# Patient Record
Sex: Female | Born: 1945 | Race: White | Hispanic: No | Smoking: Never smoker
Health system: Southern US, Community
[De-identification: ages and names within clinical notes are randomized; demographics above are authoritative.]

## PROBLEM LIST (undated history)

## (undated) DIAGNOSIS — I499 Cardiac arrhythmia, unspecified: Secondary | ICD-10-CM

## (undated) DIAGNOSIS — I10 Essential (primary) hypertension: Secondary | ICD-10-CM

## (undated) DIAGNOSIS — I350 Nonrheumatic aortic (valve) stenosis: Secondary | ICD-10-CM

## (undated) DIAGNOSIS — G2581 Restless legs syndrome: Secondary | ICD-10-CM

## (undated) DIAGNOSIS — M549 Dorsalgia, unspecified: Secondary | ICD-10-CM

## (undated) DIAGNOSIS — Z950 Presence of cardiac pacemaker: Secondary | ICD-10-CM

## (undated) DIAGNOSIS — N189 Chronic kidney disease, unspecified: Secondary | ICD-10-CM

## (undated) DIAGNOSIS — K219 Gastro-esophageal reflux disease without esophagitis: Secondary | ICD-10-CM

## (undated) DIAGNOSIS — R5383 Other fatigue: Secondary | ICD-10-CM

## (undated) DIAGNOSIS — Z87442 Personal history of urinary calculi: Secondary | ICD-10-CM

## (undated) DIAGNOSIS — D649 Anemia, unspecified: Secondary | ICD-10-CM

## (undated) DIAGNOSIS — J45909 Unspecified asthma, uncomplicated: Secondary | ICD-10-CM

## (undated) DIAGNOSIS — F329 Major depressive disorder, single episode, unspecified: Secondary | ICD-10-CM

## (undated) DIAGNOSIS — I779 Disorder of arteries and arterioles, unspecified: Secondary | ICD-10-CM

## (undated) DIAGNOSIS — E119 Type 2 diabetes mellitus without complications: Secondary | ICD-10-CM

## (undated) DIAGNOSIS — I4819 Other persistent atrial fibrillation: Secondary | ICD-10-CM

## (undated) DIAGNOSIS — E785 Hyperlipidemia, unspecified: Secondary | ICD-10-CM

## (undated) DIAGNOSIS — E669 Obesity, unspecified: Secondary | ICD-10-CM

## (undated) DIAGNOSIS — E559 Vitamin D deficiency, unspecified: Secondary | ICD-10-CM

## (undated) DIAGNOSIS — F32A Depression, unspecified: Secondary | ICD-10-CM

## (undated) DIAGNOSIS — R011 Cardiac murmur, unspecified: Secondary | ICD-10-CM

## (undated) DIAGNOSIS — M069 Rheumatoid arthritis, unspecified: Secondary | ICD-10-CM

## (undated) DIAGNOSIS — I2510 Atherosclerotic heart disease of native coronary artery without angina pectoris: Secondary | ICD-10-CM

## (undated) DIAGNOSIS — M2550 Pain in unspecified joint: Secondary | ICD-10-CM

## (undated) DIAGNOSIS — I509 Heart failure, unspecified: Secondary | ICD-10-CM

## (undated) DIAGNOSIS — Z8679 Personal history of other diseases of the circulatory system: Secondary | ICD-10-CM

## (undated) DIAGNOSIS — M1990 Unspecified osteoarthritis, unspecified site: Secondary | ICD-10-CM

## (undated) DIAGNOSIS — I517 Cardiomegaly: Secondary | ICD-10-CM

## (undated) DIAGNOSIS — R0602 Shortness of breath: Secondary | ICD-10-CM

## (undated) HISTORY — DX: Anemia, unspecified: D64.9

## (undated) HISTORY — DX: Vitamin D deficiency, unspecified: E55.9

## (undated) HISTORY — DX: Unspecified asthma, uncomplicated: J45.909

## (undated) HISTORY — DX: Cardiomegaly: I51.7

## (undated) HISTORY — DX: Type 2 diabetes mellitus without complications: E11.9

## (undated) HISTORY — DX: Essential (primary) hypertension: I10

## (undated) HISTORY — DX: Pain in unspecified joint: M25.50

## (undated) HISTORY — PX: BUNIONECTOMY: SHX129

## (undated) HISTORY — DX: Other fatigue: R53.83

## (undated) HISTORY — PX: WRIST SURGERY: SHX841

## (undated) HISTORY — PX: MOUTH SURGERY: SHX715

## (undated) HISTORY — DX: Shortness of breath: R06.02

## (undated) HISTORY — DX: Dorsalgia, unspecified: M54.9

## (undated) HISTORY — DX: Rheumatoid arthritis, unspecified: M06.9

## (undated) HISTORY — PX: TONSILLECTOMY: SUR1361

## (undated) HISTORY — PX: TUBAL LIGATION: SHX77

---
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
## 1999-02-25 ENCOUNTER — Other Ambulatory Visit: Admission: RE | Admit: 1999-02-25 | Discharge: 1999-02-25 | Payer: Self-pay | Admitting: *Deleted

## 1999-03-17 ENCOUNTER — Encounter: Admission: RE | Admit: 1999-03-17 | Discharge: 1999-03-17 | Payer: Self-pay | Admitting: *Deleted

## 1999-03-17 ENCOUNTER — Encounter: Payer: Self-pay | Admitting: *Deleted

## 2000-09-14 ENCOUNTER — Encounter (INDEPENDENT_AMBULATORY_CARE_PROVIDER_SITE_OTHER): Payer: Self-pay | Admitting: Specialist

## 2000-09-14 ENCOUNTER — Ambulatory Visit (HOSPITAL_COMMUNITY): Admission: RE | Admit: 2000-09-14 | Discharge: 2000-09-14 | Payer: Self-pay | Admitting: Gastroenterology

## 2000-09-21 ENCOUNTER — Encounter: Payer: Self-pay | Admitting: Internal Medicine

## 2000-09-21 ENCOUNTER — Encounter: Admission: RE | Admit: 2000-09-21 | Discharge: 2000-09-21 | Payer: Self-pay | Admitting: Internal Medicine

## 2001-09-24 ENCOUNTER — Encounter: Admission: RE | Admit: 2001-09-24 | Discharge: 2001-09-24 | Payer: Self-pay | Admitting: Internal Medicine

## 2001-09-24 ENCOUNTER — Encounter: Payer: Self-pay | Admitting: Internal Medicine

## 2002-08-29 ENCOUNTER — Other Ambulatory Visit: Admission: RE | Admit: 2002-08-29 | Discharge: 2002-08-29 | Payer: Self-pay | Admitting: Internal Medicine

## 2003-09-09 ENCOUNTER — Encounter: Admission: RE | Admit: 2003-09-09 | Discharge: 2003-09-09 | Payer: Self-pay | Admitting: Internal Medicine

## 2004-09-03 ENCOUNTER — Other Ambulatory Visit: Admission: RE | Admit: 2004-09-03 | Discharge: 2004-09-03 | Payer: Self-pay | Admitting: Internal Medicine

## 2004-09-10 ENCOUNTER — Encounter: Admission: RE | Admit: 2004-09-10 | Discharge: 2004-09-10 | Payer: Self-pay | Admitting: Internal Medicine

## 2004-09-23 ENCOUNTER — Encounter: Admission: RE | Admit: 2004-09-23 | Discharge: 2004-09-23 | Payer: Self-pay | Admitting: Internal Medicine

## 2005-09-03 ENCOUNTER — Emergency Department (HOSPITAL_COMMUNITY): Admission: EM | Admit: 2005-09-03 | Discharge: 2005-09-03 | Payer: Self-pay | Admitting: Emergency Medicine

## 2005-09-07 ENCOUNTER — Other Ambulatory Visit: Admission: RE | Admit: 2005-09-07 | Discharge: 2005-09-07 | Payer: Self-pay | Admitting: Internal Medicine

## 2005-09-23 ENCOUNTER — Encounter: Admission: RE | Admit: 2005-09-23 | Discharge: 2005-09-23 | Payer: Self-pay | Admitting: Orthopedic Surgery

## 2005-11-02 ENCOUNTER — Emergency Department (HOSPITAL_COMMUNITY): Admission: EM | Admit: 2005-11-02 | Discharge: 2005-11-02 | Payer: Self-pay | Admitting: Emergency Medicine

## 2006-03-16 ENCOUNTER — Encounter: Admission: RE | Admit: 2006-03-16 | Discharge: 2006-03-16 | Payer: Self-pay | Admitting: Internal Medicine

## 2007-12-12 ENCOUNTER — Other Ambulatory Visit: Admission: RE | Admit: 2007-12-12 | Discharge: 2007-12-12 | Payer: Self-pay | Admitting: Internal Medicine

## 2007-12-14 ENCOUNTER — Encounter: Admission: RE | Admit: 2007-12-14 | Discharge: 2007-12-14 | Payer: Self-pay | Admitting: Internal Medicine

## 2009-04-03 ENCOUNTER — Encounter: Admission: RE | Admit: 2009-04-03 | Discharge: 2009-04-03 | Payer: Self-pay | Admitting: Specialist

## 2009-05-01 ENCOUNTER — Emergency Department (HOSPITAL_COMMUNITY): Admission: EM | Admit: 2009-05-01 | Discharge: 2009-05-01 | Payer: Self-pay | Admitting: Emergency Medicine

## 2009-05-21 ENCOUNTER — Encounter: Admission: RE | Admit: 2009-05-21 | Discharge: 2009-05-21 | Payer: Self-pay | Admitting: Urology

## 2010-02-21 ENCOUNTER — Encounter: Payer: Self-pay | Admitting: Internal Medicine

## 2010-02-21 ENCOUNTER — Encounter: Payer: Self-pay | Admitting: Urology

## 2010-06-18 NOTE — Procedures (Signed)
[facility]  Patient:    Briana Foster, Briana Foster                        MRN: [id number] Proc. Date: 09/14/00 Attending:  Verlin Grills, M.D. CC:         Georgann Housekeeper, M.D., [facility]   Procedure Report  PROCEDURE:  Colonoscopy and rectal polyp biopsy.  REFERRING PHYSICIAN:  Georgann Housekeeper, M.D.  PROCEDURE INDICATION:  [name] (date of birth 01-06-2046) is a 65 year old female, who is referred for surveillance colonoscopy and polypectomy to prevent colon cancer.  ENDOSCOPIST:  Verlin Grills, M.D.  PREMEDICATION:  Demerol 50 mg, Versed 10 mg.  ENDOSCOPE:  Olympus pediatric colonoscope.  DESCRIPTION OF PROCEDURE:  After obtaining informed consent, [name] was placed in the left lateral decubitus position.  I administered intravenous Demerol and intravenous Versed to achieve conscious sedation for the procedure.  The patients blood pressure, oxygen saturations, and cardiac rhythm were monitored throughout the procedure and documented in the medical record.  Anal inspection was normal.  Digital rectal exam was normal.  The Olympus pediatric video colonoscope was introduced into the rectum and easily advanced to the cecum.  Colonic preparation for the exam today was excellent.  RECTUM:  From the mid rectum, a 0.5 mm whitish polyp was removed with the cold biopsy forceps and submitted to pathology for interpretation.  I doubt this is a neoplastic polyps.  SIGMOID COLON AND DESCENDING COLON:  Normal.  SPLENIC FLEXURE:  Normal.  TRANSVERSE COLON:  Normal.  HEPATIC FLEXURE:  Normal.  ASCENDING COLON:  Normal.  CECUM AND ILEOCECAL VALVE:  Normal.  ASSESSMENT:  From the mid rectum, a 0.5 mm whitish rectal polyp was removed with the cold biopsy forceps and submitted for pathological interpretation. Otherwise, normal proctocolonoscopy to the cecum. DD:  09/14/00 TD:  09/14/00 Job: [id number]

## 2011-11-16 ENCOUNTER — Other Ambulatory Visit: Payer: Self-pay | Admitting: Orthopedic Surgery

## 2011-11-24 ENCOUNTER — Encounter (HOSPITAL_COMMUNITY)
Admission: RE | Admit: 2011-11-24 | Discharge: 2011-11-24 | Disposition: A | Discharge status: Home / Self Care | Payer: 59 | Source: Ambulatory Visit | Attending: Orthopedic Surgery | Admitting: Orthopedic Surgery

## 2011-11-24 ENCOUNTER — Encounter (HOSPITAL_COMMUNITY): Payer: Self-pay

## 2011-11-24 ENCOUNTER — Encounter (HOSPITAL_COMMUNITY): Payer: Self-pay | Admitting: Pharmacy Technician

## 2011-11-24 HISTORY — DX: Depression, unspecified: F32.A

## 2011-11-24 HISTORY — DX: Restless legs syndrome: G25.81

## 2011-11-24 HISTORY — DX: Hyperlipidemia, unspecified: E78.5

## 2011-11-24 HISTORY — DX: Gastro-esophageal reflux disease without esophagitis: K21.9

## 2011-11-24 HISTORY — DX: Personal history of other diseases of the circulatory system: Z86.79

## 2011-11-24 HISTORY — DX: Major depressive disorder, single episode, unspecified: F32.9

## 2011-11-24 HISTORY — DX: Unspecified osteoarthritis, unspecified site: M19.90

## 2011-11-24 LAB — CBC
HCT: 39.5 % (ref 36.0–46.0)
MCHC: 33.9 g/dL (ref 30.0–36.0)
RBC: 4.31 MIL/uL (ref 3.87–5.11)
WBC: 7.1 10*3/uL (ref 4.0–10.5)

## 2011-11-24 LAB — SURGICAL PCR SCREEN: MRSA, PCR: NEGATIVE

## 2011-11-24 NOTE — Progress Notes (Signed)
Phone order taken for Ancef 2 Gm IV pre op from [facility] / Mariea Clonts RN --order put into [facility]

## 2011-11-24 NOTE — Patient Instructions (Addendum)
Claudene B Frieden  11/24/2011                           YOUR PROCEDURE IS SCHEDULED ON:  11/30/11 AT 12:30 PM               PLEASE REPORT TO SHORT STAY CENTER AT :  10:00 AM               CALL THIS NUMBER IF ANY PROBLEMS THE DAY OF SURGERY :               [phone]                      REMEMBER:   Do not eat food or drink liquids AFTER MIDNIGHT  May have clear liquids UNTIL 6 HOURS BEFORE SURGERY 6:30 AM)  Clear liquids include soda, tea, black coffee, apple or grape juice, broth.  Take these medicines the morning of surgery with A SIP OF WATER:  CYMBALTA   Do not wear jewelry, make-up   Do not wear lotions, powders, or perfumes.   Do not shave legs or underarms 12 hrs. before surgery (men may shave face)  Do not bring valuables to the hospital.  Contacts, dentures or bridgework may not be worn into surgery.  Leave suitcase in the car. After surgery it may be brought to your room.  For patients admitted to the hospital more than one night, checkout time is 11:00                          The day of discharge.   Patients discharged the day of surgery will not be allowed to drive home                             If going home same day of surgery, must have someone stay with you first                           24 hrs at home and arrange for some one to drive you home from hospital.    Special Instructions:   Please read over the following fact sheets that you were given:               1. MRSA  INFORMATION                      2.  PREPARING FOR SURGERY SHEET                                                X_____________________________________________________________________

## 2011-11-30 ENCOUNTER — Encounter (HOSPITAL_COMMUNITY): Payer: Self-pay | Admitting: Anesthesiology

## 2011-11-30 ENCOUNTER — Observation Stay (HOSPITAL_COMMUNITY)
Admission: RE | Admit: 2011-11-30 | Discharge: 2011-12-01 | Disposition: A | Discharge status: Home / Self Care | Payer: 59 | Source: Ambulatory Visit | Attending: Orthopedic Surgery | Admitting: Orthopedic Surgery

## 2011-11-30 ENCOUNTER — Encounter (HOSPITAL_COMMUNITY)
Admission: RE | Disposition: A | Discharge status: Home / Self Care | Payer: Self-pay | Source: Ambulatory Visit | Attending: Orthopedic Surgery

## 2011-11-30 ENCOUNTER — Ambulatory Visit (HOSPITAL_COMMUNITY): Payer: 59 | Admitting: Anesthesiology

## 2011-11-30 ENCOUNTER — Encounter (HOSPITAL_COMMUNITY): Payer: Self-pay | Admitting: *Deleted

## 2011-11-30 DIAGNOSIS — I10 Essential (primary) hypertension: Secondary | ICD-10-CM | POA: Insufficient documentation

## 2011-11-30 DIAGNOSIS — L84 Corns and callosities: Secondary | ICD-10-CM | POA: Insufficient documentation

## 2011-11-30 DIAGNOSIS — M2010 Hallux valgus (acquired), unspecified foot: Principal | ICD-10-CM | POA: Insufficient documentation

## 2011-11-30 DIAGNOSIS — Z01810 Encounter for preprocedural cardiovascular examination: Secondary | ICD-10-CM | POA: Insufficient documentation

## 2011-11-30 DIAGNOSIS — E785 Hyperlipidemia, unspecified: Secondary | ICD-10-CM | POA: Insufficient documentation

## 2011-11-30 DIAGNOSIS — Z01812 Encounter for preprocedural laboratory examination: Secondary | ICD-10-CM | POA: Insufficient documentation

## 2011-11-30 DIAGNOSIS — K219 Gastro-esophageal reflux disease without esophagitis: Secondary | ICD-10-CM | POA: Insufficient documentation

## 2011-11-30 DIAGNOSIS — M21619 Bunion of unspecified foot: Secondary | ICD-10-CM | POA: Insufficient documentation

## 2011-11-30 HISTORY — PX: BUNIONECTOMY: SHX129

## 2011-11-30 SURGERY — BUNIONECTOMY
Anesthesia: General | Site: Foot | Laterality: Bilateral | Wound class: Clean

## 2011-11-30 MED ORDER — MORPHINE SULFATE (PF) 1 MG/ML IV SOLN
INTRAVENOUS | Status: AC
  Filled 2011-11-30: qty 25

## 2011-11-30 MED ORDER — HYDROMORPHONE HCL PF 1 MG/ML IJ SOLN
0.2500 mg | INTRAMUSCULAR | Status: DC | PRN
  Administered 2011-11-30 (×2): 0.5 mg via INTRAVENOUS

## 2011-11-30 MED ORDER — LABETALOL HCL 5 MG/ML IV SOLN
INTRAVENOUS | Status: DC | PRN
  Administered 2011-11-30: 5 mg via INTRAVENOUS

## 2011-11-30 MED ORDER — KETOROLAC TROMETHAMINE 30 MG/ML IJ SOLN
INTRAMUSCULAR | Status: DC | PRN
  Administered 2011-11-30: 30 mg via INTRAVENOUS

## 2011-11-30 MED ORDER — CEFAZOLIN SODIUM 1-5 GM-% IV SOLN
1.0000 g | Freq: Four times a day (QID) | INTRAVENOUS | Status: AC
  Administered 2011-11-30 – 2011-12-01 (×3): 1 g via INTRAVENOUS
  Filled 2011-11-30 (×4): qty 50

## 2011-11-30 MED ORDER — LACTATED RINGERS IV SOLN: INTRAVENOUS | Status: DC

## 2011-11-30 MED ORDER — DEXTROSE-NACL 5-0.45 % IV SOLN
INTRAVENOUS | Status: DC
  Administered 2011-11-30: 100 mL/h via INTRAVENOUS
  Administered 2011-12-01: 04:00:00 via INTRAVENOUS

## 2011-11-30 MED ORDER — BUPIVACAINE HCL (PF) 0.5 % IJ SOLN
INTRAMUSCULAR | Status: AC
  Filled 2011-11-30: qty 30

## 2011-11-30 MED ORDER — MORPHINE SULFATE (PF) 1 MG/ML IV SOLN
INTRAVENOUS | Status: DC
  Administered 2011-11-30: 15:00:00 via INTRAVENOUS

## 2011-11-30 MED ORDER — LACTATED RINGERS IV SOLN
INTRAVENOUS | Status: DC
  Administered 2011-11-30: 13:00:00 via INTRAVENOUS
  Administered 2011-11-30: 1000 mL via INTRAVENOUS

## 2011-11-30 MED ORDER — ONDANSETRON HCL 4 MG PO TABS: 4.0000 mg | ORAL_TABLET | Freq: Four times a day (QID) | ORAL | Status: DC | PRN

## 2011-11-30 MED ORDER — MEPERIDINE HCL 50 MG/ML IJ SOLN: 6.2500 mg | INTRAMUSCULAR | Status: DC | PRN

## 2011-11-30 MED ORDER — EPHEDRINE SULFATE 50 MG/ML IJ SOLN
INTRAMUSCULAR | Status: DC | PRN
  Administered 2011-11-30 (×2): 5 mg via INTRAVENOUS

## 2011-11-30 MED ORDER — ONDANSETRON HCL 4 MG/2ML IJ SOLN: 4.0000 mg | Freq: Four times a day (QID) | INTRAMUSCULAR | Status: DC | PRN

## 2011-11-30 MED ORDER — CEFAZOLIN SODIUM-DEXTROSE 2-3 GM-% IV SOLR
2.0000 g | INTRAVENOUS | Status: AC
  Administered 2011-11-30: 2 g via INTRAVENOUS

## 2011-11-30 MED ORDER — ACETAMINOPHEN 10 MG/ML IV SOLN
INTRAVENOUS | Status: AC
  Filled 2011-11-30: qty 100

## 2011-11-30 MED ORDER — DEXAMETHASONE SODIUM PHOSPHATE 4 MG/ML IJ SOLN
INTRAMUSCULAR | Status: DC | PRN
  Administered 2011-11-30: 10 mg via INTRAVENOUS

## 2011-11-30 MED ORDER — HYDROMORPHONE HCL PF 1 MG/ML IJ SOLN
INTRAMUSCULAR | Status: AC
  Filled 2011-11-30: qty 1

## 2011-11-30 MED ORDER — ONDANSETRON HCL 4 MG/2ML IJ SOLN
4.0000 mg | Freq: Four times a day (QID) | INTRAMUSCULAR | Status: DC | PRN
  Administered 2011-12-01: 4 mg via INTRAVENOUS
  Filled 2011-11-30: qty 2

## 2011-11-30 MED ORDER — POVIDONE-IODINE 7.5 % EX SOLN: Freq: Once | CUTANEOUS | Status: DC

## 2011-11-30 MED ORDER — BUPIVACAINE HCL (PF) 0.5 % IJ SOLN
INTRAMUSCULAR | Status: DC | PRN
  Administered 2011-11-30: 1 mL

## 2011-11-30 MED ORDER — ONDANSETRON HCL 4 MG/2ML IJ SOLN
INTRAMUSCULAR | Status: DC | PRN
  Administered 2011-11-30: 4 mg via INTRAVENOUS

## 2011-11-30 MED ORDER — DIPHENHYDRAMINE HCL 12.5 MG/5ML PO ELIX: 12.5000 mg | ORAL_SOLUTION | Freq: Four times a day (QID) | ORAL | Status: DC | PRN

## 2011-11-30 MED ORDER — SODIUM CHLORIDE 0.9 % IJ SOLN: 9.0000 mL | INTRAMUSCULAR | Status: DC | PRN

## 2011-11-30 MED ORDER — MIDAZOLAM HCL 5 MG/5ML IJ SOLN
INTRAMUSCULAR | Status: DC | PRN
  Administered 2011-11-30: 2 mg via INTRAVENOUS

## 2011-11-30 MED ORDER — ACETAMINOPHEN 10 MG/ML IV SOLN
INTRAVENOUS | Status: DC | PRN
  Administered 2011-11-30: 1000 mg via INTRAVENOUS

## 2011-11-30 MED ORDER — GABAPENTIN 300 MG PO CAPS
300.0000 mg | ORAL_CAPSULE | Freq: Every evening | ORAL | Status: DC
  Administered 2011-11-30: 300 mg via ORAL
  Filled 2011-11-30 (×2): qty 1

## 2011-11-30 MED ORDER — FENTANYL CITRATE 0.05 MG/ML IJ SOLN: 25.0000 ug | INTRAMUSCULAR | Status: DC | PRN

## 2011-11-30 MED ORDER — LIDOCAINE HCL (CARDIAC) 20 MG/ML IV SOLN
INTRAVENOUS | Status: DC | PRN
  Administered 2011-11-30: 100 mg via INTRAVENOUS

## 2011-11-30 MED ORDER — CEFAZOLIN SODIUM-DEXTROSE 2-3 GM-% IV SOLR
INTRAVENOUS | Status: AC
  Filled 2011-11-30: qty 50

## 2011-11-30 MED ORDER — MORPHINE SULFATE 2 MG/ML IJ SOLN: 1.0000 mg | INTRAMUSCULAR | Status: DC | PRN

## 2011-11-30 MED ORDER — PROMETHAZINE HCL 25 MG/ML IJ SOLN: 6.2500 mg | INTRAMUSCULAR | Status: DC | PRN

## 2011-11-30 MED ORDER — ROPINIROLE HCL 1 MG PO TABS
2.0000 mg | ORAL_TABLET | Freq: Every day | ORAL | Status: DC
Start: 2011-11-30 — End: 2011-12-01
  Administered 2011-11-30: 2 mg via ORAL
  Filled 2011-11-30 (×2): qty 2

## 2011-11-30 MED ORDER — 0.9 % SODIUM CHLORIDE (POUR BTL) OPTIME
TOPICAL | Status: DC | PRN
  Administered 2011-11-30: 1000 mL

## 2011-11-30 MED ORDER — BUPIVACAINE HCL 0.5 % IJ SOLN
INTRAMUSCULAR | Status: DC | PRN
  Administered 2011-11-30: 1 mL

## 2011-11-30 MED ORDER — FENTANYL CITRATE 0.05 MG/ML IJ SOLN
INTRAMUSCULAR | Status: DC | PRN
  Administered 2011-11-30 (×3): 25 ug via INTRAVENOUS
  Administered 2011-11-30: 50 ug via INTRAVENOUS
  Administered 2011-11-30: 25 ug via INTRAVENOUS

## 2011-11-30 MED ORDER — HYDROCODONE-ACETAMINOPHEN 7.5-325 MG PO TABS
1.0000 | ORAL_TABLET | Freq: Four times a day (QID) | ORAL | Status: DC | PRN
  Filled 2011-11-30: qty 1

## 2011-11-30 MED ORDER — NALOXONE HCL 0.4 MG/ML IJ SOLN: 0.4000 mg | INTRAMUSCULAR | Status: DC | PRN

## 2011-11-30 MED ORDER — DIPHENHYDRAMINE HCL 50 MG/ML IJ SOLN: 12.5000 mg | Freq: Four times a day (QID) | INTRAMUSCULAR | Status: DC | PRN

## 2011-11-30 MED ORDER — PROPOFOL 10 MG/ML IV BOLUS
INTRAVENOUS | Status: DC | PRN
  Administered 2011-11-30: 200 mg via INTRAVENOUS

## 2011-11-30 MED ORDER — KETAMINE HCL 10 MG/ML IJ SOLN
INTRAMUSCULAR | Status: DC | PRN
  Administered 2011-11-30: 25 mg via INTRAVENOUS
  Administered 2011-11-30: 20 mg via INTRAVENOUS

## 2011-11-30 MED ORDER — DULOXETINE HCL 30 MG PO CPEP
30.0000 mg | ORAL_CAPSULE | Freq: Every day | ORAL | Status: DC
  Administered 2011-12-01: 30 mg via ORAL
  Filled 2011-11-30: qty 1

## 2011-11-30 MED ORDER — METOCLOPRAMIDE HCL 5 MG/ML IJ SOLN: 5.0000 mg | Freq: Three times a day (TID) | INTRAMUSCULAR | Status: DC | PRN

## 2011-11-30 MED ORDER — METOCLOPRAMIDE HCL 10 MG PO TABS: 5.0000 mg | ORAL_TABLET | Freq: Three times a day (TID) | ORAL | Status: DC | PRN

## 2011-11-30 SURGICAL SUPPLY — 28 items
BAG ZIPLOCK 12X15 (MISCELLANEOUS) ×2 IMPLANT
BANDAGE CONFORM 3  STR LF (GAUZE/BANDAGES/DRESSINGS) ×4 IMPLANT
BLADE OSCILLATING/SAGITTAL (BLADE) ×1
BLADE SURG 15 STRL LF DISP TIS (BLADE) ×2 IMPLANT
BLADE SURG 15 STRL SS (BLADE) ×2
BLADE SW THK.38XMED LNG THN (BLADE) ×1 IMPLANT
BNDG COHESIVE 3X5 TAN STRL LF (GAUZE/BANDAGES/DRESSINGS) ×4 IMPLANT
CLOTH BEACON ORANGE TIMEOUT ST (SAFETY) ×2 IMPLANT
CUFF TOURN SGL QUICK 34 (TOURNIQUET CUFF) ×2
CUFF TRNQT CYL 34X4X40X1 (TOURNIQUET CUFF) ×2 IMPLANT
DEPRESSOR TONGUE BLADE STERILE (MISCELLANEOUS) ×2 IMPLANT
DRSG EMULSION OIL 3X3 NADH (GAUZE/BANDAGES/DRESSINGS) ×4 IMPLANT
DURAPREP 26ML APPLICATOR (WOUND CARE) ×4 IMPLANT
ELECT REM PT RETURN 9FT ADLT (ELECTROSURGICAL) ×2
ELECTRODE REM PT RTRN 9FT ADLT (ELECTROSURGICAL) ×1 IMPLANT
GLOVE BIOGEL PI IND STRL 8 (GLOVE) ×2 IMPLANT
GLOVE BIOGEL PI INDICATOR 8 (GLOVE) ×2
GLOVE ECLIPSE 8.0 STRL XLNG CF (GLOVE) ×2 IMPLANT
KIT BASIN OR (CUSTOM PROCEDURE TRAY) ×2 IMPLANT
MANIFOLD NEPTUNE II (INSTRUMENTS) ×2 IMPLANT
NS IRRIG 1000ML POUR BTL (IV SOLUTION) ×2 IMPLANT
PACK LOWER EXTREMITY WL (CUSTOM PROCEDURE TRAY) ×2 IMPLANT
PAD CAST 3X4 CTTN HI CHSV (CAST SUPPLIES) ×1 IMPLANT
PADDING CAST COTTON 3X4 STRL (CAST SUPPLIES) ×1
SPONGE GAUZE 4X4 12PLY (GAUZE/BANDAGES/DRESSINGS) ×4 IMPLANT
SUT ETHILON 4 0 PS 2 18 (SUTURE) ×6 IMPLANT
SUT VIC AB 4-0 PS1 27 (SUTURE) ×4 IMPLANT
SUT VICRYL 0 27 CT2 27 ABS (SUTURE) ×4 IMPLANT

## 2011-11-30 NOTE — Brief Op Note (Signed)
11/30/2011  2:10 PM  PATIENT:  Briana Foster  66 y.o. female  PRE-OPERATIVE DIAGNOSIS:  right foot painful bunion and left foot painful bunion   POST-OPERATIVE DIAGNOSIS:  right foot painful bunion and left foot painful bunion   PROCEDURE:  Procedure(s) (LRB) with comments: BUNIONECTOMY (Bilateral) - RIGHT FOOT EXCISION OF BUNIONETTE AND PARTIAL   PROXIMAL PHALANGECTOMY OF 5TH TOE LEFT FOOT FUNK BUNIONECTOMY,EXCISION OF BUNIONETTE   SURGEON:  Surgeon(s) and Role:    * Drucilla Schmidt, MD - Primary  PHYSICIAN ASSISTANT:   ASSISTANTSnurse  ANESTHESIA:   local and general  EBL:  Total I/O In: 1250 [I.V.:1250] Out: -   BLOOD ADMINISTERED:none  DRAINS: none   LOCAL MEDICATIONS USED:  MARCAINE     SPECIMEN:  No Specimen  DISPOSITION OF SPECIMEN:  N/A  COUNTS:  YES  TOURNIQUET:   Total Tourniquet Time Documented: Thigh (Left) - 97 minutes Thigh (Right) - 35 minutes  DICTATION: .Other Dictation: Dictation Number [id number]  PLAN OF CARE: Admit for overnight observation  PATIENT DISPOSITION:  PACU - hemodynamically stable.   Delay start of Pharmacological VTE agent (>24hrs) due to surgical blood loss or risk of bleeding: yes

## 2011-11-30 NOTE — Anesthesia Preprocedure Evaluation (Addendum)
Anesthesia Evaluation  Patient identified by MRN, date of birth, ID band Patient awake    Reviewed: Allergy & Precautions, H&P , NPO status , Patient's Chart, lab work & pertinent test results  Airway Mallampati: II TM Distance: >3 FB Neck ROM: Full    Dental No notable dental hx. (+) Edentulous Upper and Edentulous Lower   Pulmonary neg pulmonary ROS,  breath sounds clear to auscultation  Pulmonary exam normal       Cardiovascular negative cardio ROS  Rhythm:Regular Rate:Normal     Neuro/Psych negative neurological ROS  negative psych ROS   GI/Hepatic Neg liver ROS,   Endo/Other  negative endocrine ROS  Renal/GU negative Renal ROS  negative genitourinary   Musculoskeletal negative musculoskeletal ROS (+)   Abdominal   Peds negative pediatric ROS (+)  Hematology negative hematology ROS (+)   Anesthesia Other Findings   Reproductive/Obstetrics negative OB ROS                           Anesthesia Physical Anesthesia Plan  ASA: II  Anesthesia Plan: General   Post-op Pain Management:    Induction: Intravenous  Airway Management Planned: LMA  Additional Equipment:   Intra-op Plan:   Post-operative Plan: Extubation in OR  Informed Consent: I have reviewed the patients History and Physical, chart, labs and discussed the procedure including the risks, benefits and alternatives for the proposed anesthesia with the patient or authorized representative who has indicated his/her understanding and acceptance.   Dental advisory given  Plan Discussed with: CRNA  Anesthesia Plan Comments:         Anesthesia Quick Evaluation

## 2011-11-30 NOTE — H&P (Signed)
Briana Foster is an 66 y.o. female.   Chief Complaint:bilateral painful feet HPI: history of bilateral  Bunionectomies; has recurrent bunion lt foot,bilat painful bunionettes and corn outer rt little toe  Past Medical History  Diagnosis Date  . Hyperlipidemia   . Arthritis   . Bilateral bunions   . GERD (gastroesophageal reflux disease)     "comes and goes" - no meds currently  . Depression   . History of hypertension     pt stopped BP med several months ago because BP has been normal  . Restless leg syndrome     Past Surgical History  Procedure Date  . Tonsillectomy   . Mouth surgery   . Bunionectomy 20 yrs ago    bil feet    History reviewed. No pertinent family history. Social History:  reports that she has never smoked. She does not have any smokeless tobacco history on file. She reports that she does not drink alcohol or use illicit drugs.  Allergies:  Allergies  Allergen Reactions  . Apricot Flavor Swelling    Eyes swell shut    Medications Prior to Admission  Medication Sig Dispense Refill  . DULoxetine (CYMBALTA) 30 MG capsule Take 30 mg by mouth daily before breakfast.      . gabapentin (NEURONTIN) 300 MG capsule Take 300 mg by mouth every evening.      Marland Kitchen rOPINIRole (REQUIP) 2 MG tablet Take 2 mg by mouth at bedtime.      Marland Kitchen acetaminophen (TYLENOL) 500 MG tablet Take 500 mg by mouth as needed. Pain        No results found for this or any previous visit (from the past 48 hour(s)). No results found.  ROS  Blood pressure 179/90, pulse 66, temperature 98 F (36.7 C), resp. rate 20, SpO2 96.00%. Physical Exam  Constitutional: She is oriented to person, place, and time. She appears well-developed and well-nourished.  HENT:  Head: Normocephalic and atraumatic.  Right Ear: External ear normal.  Left Ear: External ear normal.  Nose: Nose normal.  Mouth/Throat: Oropharynx is clear and moist.  Eyes: Conjunctivae normal and EOM are normal. Pupils are equal,  round, and reactive to light.  Neck: Normal range of motion. Neck supple.  Cardiovascular: Normal rate, regular rhythm, normal heart sounds and intact distal pulses.   Respiratory: Effort normal and breath sounds normal.  GI: Soft. Bowel sounds are normal.  Musculoskeletal: Normal range of motion.  Neurological: She is alert and oriented to person, place, and time. She has normal reflexes.  Skin: Skin is warm and dry.  Psychiatric: She has a normal mood and affect. Her behavior is normal. Judgment and thought content normal.     Assessment/Plan Painful bunionettes ; painful corn rt little toe; painful bunion with hallux valgus Correction of all above  APLINGTON,JAMES P 11/30/2011, 11:09 AM

## 2011-11-30 NOTE — Transfer of Care (Signed)
Immediate Anesthesia Transfer of Care Note  Patient: Briana Foster  Procedure(s) Performed: Procedure(s) (LRB) with comments: BUNIONECTOMY (Bilateral) - RIGHT FOOT EXCISION OF BUNIONETTE AND PARTIAL   PROXIMAL PHALANGECTOMY OF 5TH TOE LEFT FOOT FUNK BUNIONECTOMY,EXCISION OF BUNIONETTE   Patient Location: PACU  Anesthesia Type:General  Level of Consciousness: awake and oriented  Airway & Oxygen Therapy: Patient Spontanous Breathing and Patient connected to face mask oxygen  Post-op Assessment: Report given to PACU RN and Post -op Vital signs reviewed and stable  Post vital signs: Reviewed and stable  Complications: No apparent anesthesia complications

## 2011-11-30 NOTE — Anesthesia Postprocedure Evaluation (Signed)
  Anesthesia Post-op Note  Patient: Briana Foster  Procedure(s) Performed: Procedure(s) (LRB): BUNIONECTOMY (Bilateral)  Patient Location: PACU  Anesthesia Type: General  Level of Consciousness: awake and alert   Airway and Oxygen Therapy: Patient Spontanous Breathing  Post-op Pain: mild  Post-op Assessment: Post-op Vital signs reviewed, Patient's Cardiovascular Status Stable, Respiratory Function Stable, Patent Airway and No signs of Nausea or vomiting  Post-op Vital Signs: stable  Complications: No apparent anesthesia complications

## 2011-11-30 NOTE — Preoperative (Signed)
Beta Blockers   Reason not to administer Beta Blockers:Not Applicable 

## 2011-12-01 ENCOUNTER — Encounter (HOSPITAL_COMMUNITY): Payer: Self-pay | Admitting: Orthopedic Surgery

## 2011-12-01 MED ORDER — HYDROCODONE-ACETAMINOPHEN 5-325 MG PO TABS: 1.0000 | ORAL_TABLET | Freq: Four times a day (QID) | ORAL | Status: DC | PRN

## 2011-12-01 NOTE — Care Management Note (Signed)
    Page 1 of 2   12/01/2011     4:01:10 PM   CARE MANAGEMENT NOTE 12/01/2011  Patient:  Briana Foster, Briana Foster   Account Number:  [id number]  Date Initiated:  12/01/2011  Documentation initiated by:  Colleen Can  Subjective/Objective Assessment:   DX BILATERAL FOOT BUNIONS; PARTIAL  PROXIMAL PHALANGECTOMY, EXCISION BUNIONETTE LEFT FOOT, FUNK BUNIONECTOMY     Action/Plan:   CM spoke with patient. Plans are for patient to go to her home where daughter and son in law will be caregivers. She will need RW. There are no HH needs   Anticipated DC Date:  12/01/2011   Anticipated DC Plan:  HOME/SELF CARE  In-house referral  NA      DC Planning Services  CM consult      PAC Choice  DURABLE MEDICAL EQUIPMENT   Choice offered to / List presented to:  NA   DME arranged  [facility]      DME agency  [facility]     HH arranged  NA      HH agency  NA   Status of service:  Completed, signed off Medicare Important Message given?  NO (If response is "NO", the following Medicare IM given date fields will be blank) Date Medicare IM given:   Date Additional Medicare IM given:    Discharge Disposition:    Per UR Regulation:  Reviewed for med. necessity/level of care/duration of stay  If discussed at Long Length of Stay Meetings, dates discussed:    Comments:

## 2011-12-01 NOTE — Op Note (Signed)
NAME:  Briana Foster, Briana Foster NO.:  [id number]  MEDICAL RECORD NO.:  [id number]  LOCATION:  1613                         FACILITY:  [facility]  PHYSICIAN:  Marlowe Kays, M.D.  DATE OF BIRTH:  [birth year]  DATE OF PROCEDURE:  11/30/2011 DATE OF DISCHARGE:                              OPERATIVE REPORT   PREOPERATIVE DIAGNOSES:  Bilaterally painful feet secondary to right foot corn on outer aspect of the little toe and bunionette, left foot, recurrent bunion with hallux valgus deformity and bunionette.  OPERATION: 1. Partial proximal phalangectomy. 2. Excision of bunionette, left foot. 3. Funk bunionectomy.  PATHOLOGY AND JUSTIFICATION FOR PROCEDURE:  She had had previous bunion surgery, had a prominent callus over the proximal phalanx of the right little toe.  Bunionette was painful in the right foot.  She was having no problems with her bunion on the right foot.  In the left foot, she had very prominent hallux valgus with 12.6-mm first, second metatarsal angle and tender bunionette.  PROCEDURE:  Satisfied with general anesthesia, bilateral pneumatic tourniquets with foot and ankle was prepped with DuraPrep and draped in sterile field.  Time-out performed.  The legs were both Esmarch out non- sterilely before prepping.  In the right foot, I made a curved incision over the proximal phalanx around the corn and with subperiosteal dissection, exposed the prominent distal portion of the proximal phalanx, which I have removed with rongeur.  The wound was irrigated and the skin and subcutaneous tissue were closed as a unit with interrupted 4-0 nylon mattress sutures.  I then made a dorsolateral incision over the bunionette and we split the periosteum in line with skin incision. The prominent bunionette was identified and excised with small rongeur. This wound was also irrigated well.  The periosteal tendon complex was closed with interrupted 4-0 Vicryl and skin with  interrupted 4-0 nylon simple and mattress sutures.  I then infiltrated both incisions with 0.5% plain Marcaine.  Betadine, Adaptic, and dry sterile dressing were applied.  Tourniquet was released.  I then went to the left foot with bunionectomy performed in the same way as in the right foot.  I made a dorsomedial incision over the bunion and distal first metatarsal down to the proximal phalanx and took care to carefully dissect out the dorsal sensory nerve and vessel, retracting them with blunt retraction lateralward.  The capsule was sole scarred into the previous surgery, but after developing it, I made flap base distally and retracted the capsule distally.  Residual bunion and some of the first metatarsal head were excised with osteotome and rongeur until flat.  I then measured along the cut portion of the distal metatarsal first 1 cm from the articular surface, then 6 mm proximal to this.  Placing baby Hohmann retractors, I then made a transverse cut preserving the lateral side of the cortex at the distal mark and then made an oblique cut at the proximal mark.  At the lateral cortex, I perforated by hand only with small osteotome until it was softened up enough that I was able to closed down the osteotomy, which allowed me to correct the severe valgus deformity.  With microsaw, I then took little  bit more of the first metatarsal head and also placed small amount of bone graft in the osteotomy site, this closed this down nicely.  The wound was irrigated with sterile saline.  Both incisions were infiltrated with 0.5% plain Marcaine.  With the great toe in a corrected position and closing down the osteotomy, I placed a traction proximally on the flap while the scrub tech held the great toe in the corrected position.  Sutures were with interrupted 0 Vicryl.  Skin and subcutaneous tissue were then closed with interrupted 4-0 nylon mattress sutures.  Betadine, Adaptic, and dry sterile  dressing were applied as well as a sterile well-padded tongue blade along the inner border of the foot and great toe, again holding the great toe in a corrected position.  Dry sterile dressing was then continued to be applied.  Tourniquet on the left lower extremity was released.  She tolerated the procedure well, was taken to the recovery room in satisfactory condition with no known complications.          ______________________________ Marlowe Kays, M.D.     JA/MEDQ  D:  11/30/2011  T:  12/01/2011  Job:  [id number]

## 2011-12-01 NOTE — Progress Notes (Signed)
Physical Therapy Treatment Patient Details Name: KEILIE MUSCH MRN: [id number] DOB: 12/15/1945 Today's Date: 12/01/2011 Time: 1340-1403 PT Time Calculation (min): 23 min  PT Assessment / Plan / Recommendation Comments on Treatment Session       Follow Up Recommendations  No PT follow up     Does the patient have the potential to tolerate intense rehabilitation     Barriers to Discharge        Equipment Recommendations  Rolling walker with 5" wheels    Recommendations for Other Services    Frequency 7X/week   Plan Discharge plan remains appropriate    Precautions / Restrictions Precautions Precautions: None Restrictions Weight Bearing Restrictions: No Other Position/Activity Restrictions: WBAT   Pertinent Vitals/Pain 2/10 with RW but increased when attempting crutches    Mobility  Bed Mobility Bed Mobility: Sit to Supine Sit to Supine: 5: Supervision Transfers Transfers: Sit to Stand;Stand to Sit Sit to Stand: 5: Supervision Stand to Sit: 5: Supervision Details for Transfer Assistance: cues for use of UEs to self assist Ambulation/Gait Ambulation/Gait Assistance: 4: Min assist;5: Supervision;4: Min guard Ambulation Distance (Feet): 140 Feet Assistive device: Rolling walker Ambulation/Gait Assistance Details: Cues sequence and position from RW/crutches Gait Pattern: Step-to pattern;Step-through pattern General Gait Details: Crutches attempted at pt request - pt agrees she does not feel stable with same.  Min assist with crutches vs S with RW Stairs: Yes Stairs Assistance: 4: Min assist Stairs Assistance Details (indicate cue type and reason): cues for sequence Stair Management Technique: Two rails;Step to pattern Number of Stairs: 2     Exercises     PT Diagnosis:    PT Problem List:   PT Treatment Interventions:     PT Goals Acute Rehab PT Goals PT Goal Formulation: With patient Time For Goal Achievement: 12/05/11 Potential to Achieve Goals:  Good Pt will go Supine/Side to Sit: with modified independence PT Goal: Supine/Side to Sit - Progress: Progressing toward goal Pt will go Sit to Supine/Side: with modified independence PT Goal: Sit to Supine/Side - Progress: Progressing toward goal Pt will go Sit to Stand: with modified independence PT Goal: Sit to Stand - Progress: Progressing toward goal Pt will go Stand to Sit: with modified independence PT Goal: Stand to Sit - Progress: Progressing toward goal Pt will Ambulate: >150 feet;with supervision;with least restrictive assistive device PT Goal: Ambulate - Progress: Progressing toward goal Pt will Go Up / Down Stairs: 3-5 stairs;with min assist PT Goal: Up/Down Stairs - Progress: Progressing toward goal  Visit Information  Last PT Received On: 12/01/11 Assistance Needed: +1    Subjective Data  Subjective: I'm feeling a little better but still feel so weak Patient Stated Goal: Get back to work at [employer]  Overall Cognitive Status: Appears within functional limits for tasks assessed/performed Arousal/Alertness: Awake/alert Orientation Level: Appears intact for tasks assessed Behavior During Session: [facility] for tasks performed    Balance     End of Session PT - End of Session Equipment Utilized During Treatment: Gait belt Activity Tolerance: Patient tolerated treatment well;Patient limited by fatigue Patient left: in bed;with call bell/phone within reach;with family/visitor present Nurse Communication: Mobility status   GP     Alexiz Cothran 12/01/2011, 3:42 PM

## 2011-12-01 NOTE — Progress Notes (Signed)
Patient ID: Briana Foster, female   DOB: 05/26/1945, 66 y.o.   MRN: [id number] She is alert and not having much pain but feels very ligh-headed.  She has not yet been seen by PT.  Will hold discharge until I see her later today.

## 2011-12-01 NOTE — Evaluation (Signed)
Physical Therapy Evaluation Patient Details Name: Briana Foster MRN: [id number] DOB: [birth year] Today's Date: 12/01/2011 Time: 0820-0850 PT Time Calculation (min): 30 min  PT Assessment / Plan / Recommendation Clinical Impression  Pt s/p bunionectomy presents with limitations in gait 2* presence of post op shoes, pain and c/o feeling "lighheaded".    PT Assessment  Patient needs continued PT services    Follow Up Recommendations  No PT follow up    Does the patient have the potential to tolerate intense rehabilitation      Barriers to Discharge None      Equipment Recommendations  None recommended by PT    Recommendations for Other Services     Frequency 7X/week    Precautions / Restrictions Precautions Precautions: None Restrictions Weight Bearing Restrictions: No Other Position/Activity Restrictions: WBAT   Pertinent Vitals/Pain 2/10      Mobility  Bed Mobility Bed Mobility: Supine to Sit Supine to Sit: 5: Supervision Transfers Transfers: Sit to Stand;Stand to Sit Sit to Stand: 4: Min assist Stand to Sit: 4: Min assist Details for Transfer Assistance: cues for use of UEs to self assist Ambulation/Gait Ambulation/Gait Assistance: 4: Min assist Ambulation Distance (Feet): 175 Feet Assistive device: Rolling walker Ambulation/Gait Assistance Details: cues for posture and position from RW Gait Pattern: Step-to pattern;Step-through pattern    Shoulder Instructions     Exercises     PT Diagnosis: Difficulty walking  PT Problem List: Decreased activity tolerance;Decreased mobility;Pain;Decreased knowledge of use of DME;Decreased balance PT Treatment Interventions: DME instruction;Gait training;Stair training;Functional mobility training;Therapeutic activities;Patient/family education   PT Goals Acute Rehab PT Goals PT Goal Formulation: With patient Time For Goal Achievement: 12/05/11 Potential to Achieve Goals: Good Pt will go Supine/Side to Sit: with  modified independence PT Goal: Supine/Side to Sit - Progress: Goal set today Pt will go Sit to Supine/Side: with modified independence PT Goal: Sit to Supine/Side - Progress: Goal set today Pt will go Sit to Stand: with modified independence PT Goal: Sit to Stand - Progress: Goal set today Pt will go Stand to Sit: with modified independence PT Goal: Stand to Sit - Progress: Goal set today Pt will Ambulate: >150 feet;with supervision;with least restrictive assistive device PT Goal: Ambulate - Progress: Goal set today Pt will Go Up / Down Stairs: 3-5 stairs;with min assist PT Goal: Up/Down Stairs - Progress: Goal set today  Visit Information  Last PT Received On: 12/01/11 Assistance Needed: +1    Subjective Data  Subjective: I feel a little light headed but I want to get moving Patient Stated Goal: Get back to work at [employer]   Prior Functioning  Home Living Lives With: Spouse Available Help at Discharge: Family Type of Home: House Home Access: Stairs to enter Secretary/administrator of Steps: 4 Entrance Stairs-Rails: Right;Left;Can reach both Home Layout: One level Home Adaptive Equipment: None Prior Function Level of Independence: Independent Able to Take Stairs?: Yes Driving: Yes Vocation: Part time employment Communication Communication: No difficulties Dominant Hand: Right    Cognition  Overall Cognitive Status: Appears within functional limits for tasks assessed/performed Arousal/Alertness: Awake/alert Orientation Level: Appears intact for tasks assessed Behavior During Session: [facility] for tasks performed    Extremity/Trunk Assessment Right Upper Extremity Assessment RUE ROM/Strength/Tone: [facility] for tasks assessed Left Upper Extremity Assessment LUE ROM/Strength/Tone: WFL for tasks assessed Right Lower Extremity Assessment RLE ROM/Strength/Tone: Deficits RLE ROM/Strength/Tone Deficits: limited movement foot/ankle post op Left Lower Extremity Assessment LLE  ROM/Strength/Tone: Deficits LLE ROM/Strength/Tone Deficits: limited movement post op at  ankle and foot   Balance    End of Session PT - End of Session Equipment Utilized During Treatment: Gait belt Activity Tolerance: Patient tolerated treatment well Patient left: in chair;with call bell/phone within reach Nurse Communication: Mobility status  GP Functional Assessment Tool Used: clinical judgement Functional Limitation: Mobility: Walking and moving around Mobility: Walking and Moving Around Current Status ([id number]): At least 1 percent but less than 20 percent impaired, limited or restricted Mobility: Walking and Moving Around Goal Status [phone]): At least 1 percent but less than 20 percent impaired, limited or restricted   Ming Kunka 12/01/2011, 1:05 PM

## 2011-12-17 NOTE — Discharge Summary (Signed)
NAMEMarland Kitchen  SELDA, JALBERT              ACCOUNT NO.:  [id number]  MEDICAL RECORD NO.:  [id number]  LOCATION:  1613                         FACILITY:  [facility]  PHYSICIAN:  Marlowe Kays, M.D.  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  OPERATION:  On November 30, 2011; 1. Right foot excision of bunionette and partial proximal     phalangectomy little toe. 2. Left foot Funk bunionectomy and excision of bunionette.  SUMMARY:  Ms. Sellin was having progressive pain in both feet due to the admission diagnoses.  The surgery noted above was performed without complication.  She was discharged ambulatory and wooden shoes, afebrile and comfortable. To return to my office 2 weeks from surgery.  She was instructed to use wooden shoes for walking and keep her feet dry.  DISCHARGE MEDICATIONS:  Her medications on admission and Norco and aspirin 81 mg daily.  CONDITION AT DISCHARGE:  Stable.          ______________________________ Marlowe Kays, M.D.     JA/MEDQ  D:  12/17/2011  T:  12/17/2011  Job:  [id number]

## 2012-03-30 ENCOUNTER — Other Ambulatory Visit: Payer: Self-pay | Admitting: Internal Medicine

## 2012-03-30 DIAGNOSIS — Z1231 Encounter for screening mammogram for malignant neoplasm of breast: Secondary | ICD-10-CM

## 2012-05-01 ENCOUNTER — Ambulatory Visit: Payer: 59

## 2012-05-08 ENCOUNTER — Ambulatory Visit: Payer: 59

## 2012-05-17 ENCOUNTER — Ambulatory Visit
Admission: RE | Admit: 2012-05-17 | Discharge: 2012-05-17 | Disposition: A | Discharge status: Home / Self Care | Payer: 59 | Source: Ambulatory Visit | Attending: Internal Medicine | Admitting: Internal Medicine

## 2012-05-17 DIAGNOSIS — Z1231 Encounter for screening mammogram for malignant neoplasm of breast: Secondary | ICD-10-CM

## 2012-06-04 ENCOUNTER — Other Ambulatory Visit: Payer: Self-pay | Admitting: Gastroenterology

## 2012-07-20 ENCOUNTER — Encounter (HOSPITAL_COMMUNITY): Payer: Self-pay | Admitting: Pharmacy Technician

## 2012-07-20 ENCOUNTER — Encounter (HOSPITAL_COMMUNITY): Payer: Self-pay | Admitting: *Deleted

## 2012-07-20 DIAGNOSIS — M1990 Unspecified osteoarthritis, unspecified site: Secondary | ICD-10-CM

## 2012-07-20 DIAGNOSIS — Z8679 Personal history of other diseases of the circulatory system: Secondary | ICD-10-CM

## 2012-07-20 HISTORY — DX: Unspecified osteoarthritis, unspecified site: M19.90

## 2012-07-20 HISTORY — DX: Personal history of other diseases of the circulatory system: Z86.79

## 2012-08-07 ENCOUNTER — Encounter (HOSPITAL_COMMUNITY): Payer: Self-pay | Admitting: Anesthesiology

## 2012-08-07 ENCOUNTER — Ambulatory Visit (HOSPITAL_COMMUNITY): Payer: 59 | Admitting: Anesthesiology

## 2012-08-07 ENCOUNTER — Ambulatory Visit (HOSPITAL_COMMUNITY)
Admission: RE | Admit: 2012-08-07 | Discharge: 2012-08-07 | Disposition: A | Discharge status: Home / Self Care | Payer: 59 | Source: Ambulatory Visit | Attending: Gastroenterology | Admitting: Gastroenterology

## 2012-08-07 ENCOUNTER — Encounter (HOSPITAL_COMMUNITY): Payer: Self-pay

## 2012-08-07 ENCOUNTER — Encounter (HOSPITAL_COMMUNITY)
Admission: RE | Disposition: A | Discharge status: Home / Self Care | Payer: Self-pay | Source: Ambulatory Visit | Attending: Gastroenterology

## 2012-08-07 DIAGNOSIS — K219 Gastro-esophageal reflux disease without esophagitis: Secondary | ICD-10-CM | POA: Insufficient documentation

## 2012-08-07 DIAGNOSIS — Z1211 Encounter for screening for malignant neoplasm of colon: Secondary | ICD-10-CM | POA: Insufficient documentation

## 2012-08-07 DIAGNOSIS — K5730 Diverticulosis of large intestine without perforation or abscess without bleeding: Secondary | ICD-10-CM | POA: Insufficient documentation

## 2012-08-07 DIAGNOSIS — E7800 Pure hypercholesterolemia, unspecified: Secondary | ICD-10-CM | POA: Insufficient documentation

## 2012-08-07 DIAGNOSIS — I10 Essential (primary) hypertension: Secondary | ICD-10-CM | POA: Insufficient documentation

## 2012-08-07 HISTORY — DX: Cardiac murmur, unspecified: R01.1

## 2012-08-07 HISTORY — PX: COLONOSCOPY WITH PROPOFOL: SHX5780

## 2012-08-07 SURGERY — COLONOSCOPY WITH PROPOFOL
Anesthesia: Monitor Anesthesia Care

## 2012-08-07 MED ORDER — KETAMINE HCL 10 MG/ML IJ SOLN
INTRAMUSCULAR | Status: DC | PRN
  Administered 2012-08-07 (×2): 10 mg via INTRAVENOUS

## 2012-08-07 MED ORDER — PROPOFOL INFUSION 10 MG/ML OPTIME
INTRAVENOUS | Status: DC | PRN
  Administered 2012-08-07: 140 ug/kg/min via INTRAVENOUS

## 2012-08-07 MED ORDER — LACTATED RINGERS IV SOLN
INTRAVENOUS | Status: DC
  Administered 2012-08-07: 10:00:00 via INTRAVENOUS

## 2012-08-07 MED ORDER — MIDAZOLAM HCL 5 MG/5ML IJ SOLN
INTRAMUSCULAR | Status: DC | PRN
  Administered 2012-08-07: 2 mg via INTRAVENOUS

## 2012-08-07 MED ORDER — SODIUM CHLORIDE 0.9 % IV SOLN: INTRAVENOUS | Status: DC

## 2012-08-07 SURGICAL SUPPLY — 21 items

## 2012-08-07 NOTE — H&P (Signed)
  Procedure: Screening colonoscopy  History: The patient is a 67 year old female born 28-May-1945. The patient underwent a normal screening colonoscopy on 09/14/2000. She is scheduled to undergo a repeat screening colonoscopy today to.  Past medical and surgical history: Restless leg syndrome. Hypercholesterolemia. Osteoarthritis. Anxiety with depression. Rhinitis. Hypertension. Gastroesophageal reflux. Vitamin D. deficiency. Tonsillectomy. Tubal ligation. Bunion surgery. Cyst removed from her knee.  Habits: The patient has never smoked cigarettes. She does not consume alcohol.  Medication allergies: Lipitor causes headaches.  Exam: The patient is alert and lying comfortably on the endoscopy stretcher. Abdomen is soft, flat, and nontender to palpation. Cardiac exam reveals a regular rhythm. Lungs are clear to auscultation.  Plan: Proceed with repeat screening colonoscopy.

## 2012-08-07 NOTE — Anesthesia Preprocedure Evaluation (Signed)
Anesthesia Evaluation  Patient identified by MRN, date of birth, ID band Patient awake    Reviewed: Allergy & Precautions, H&P , NPO status , Patient's Chart, lab work & pertinent test results  Airway Mallampati: II TM Distance: >3 FB Neck ROM: Full    Dental no notable dental hx. (+) Edentulous Upper and Edentulous Lower   Pulmonary neg pulmonary ROS,  breath sounds clear to auscultation  Pulmonary exam normal       Cardiovascular negative cardio ROS  Rhythm:Regular Rate:Normal     Neuro/Psych Depression negative neurological ROS  negative psych ROS   GI/Hepatic negative GI ROS, Neg liver ROS, GERD-  ,  Endo/Other  negative endocrine ROS  Renal/GU negative Renal ROS  negative genitourinary   Musculoskeletal negative musculoskeletal ROS (+)   Abdominal   Peds negative pediatric ROS (+)  Hematology negative hematology ROS (+)   Anesthesia Other Findings   Reproductive/Obstetrics negative OB ROS                           Anesthesia Physical Anesthesia Plan  ASA: I  Anesthesia Plan: MAC   Post-op Pain Management:    Induction: Intravenous  Airway Management Planned: Simple Face Mask  Additional Equipment:   Intra-op Plan:   Post-operative Plan:   Informed Consent: I have reviewed the patients History and Physical, chart, labs and discussed the procedure including the risks, benefits and alternatives for the proposed anesthesia with the patient or authorized representative who has indicated his/her understanding and acceptance.   Dental advisory given  Plan Discussed with: CRNA  Anesthesia Plan Comments:         Anesthesia Quick Evaluation

## 2012-08-07 NOTE — Transfer of Care (Signed)
Immediate Anesthesia Transfer of Care Note  Patient: Briana Foster  Procedure(s) Performed: Procedure(s): COLONOSCOPY WITH PROPOFOL (N/A)  Patient Location: PACU and Endoscopy Unit  Anesthesia Type:MAC  Level of Consciousness: awake and alert   Airway & Oxygen Therapy: Patient Spontanous Breathing and Patient connected to face mask oxygen  Post-op Assessment: Report given to PACU RN and Post -op Vital signs reviewed and stable  Post vital signs: Reviewed and stable  Complications: No apparent anesthesia complications

## 2012-08-07 NOTE — Op Note (Signed)
Procedure: Screening colonoscopy. Normal screening colonoscopy on 09/14/2000.  Endoscopist: Danise Edge  Premedication: Propofol administered by anesthesia  Procedure: The patient was placed in the left lateral decubitus position. Anal inspection and digital rectal exam were normal. The Pentax pediatric colonoscope was introduced into the rectum and advanced to the cecum. A normal-appearing ileocecal valve and appendiceal orifice were identified. Colonic preparation for the exam today was good.  Rectum. Normal. Retroflexed view of the distal rectum normal.  Sigmoid colon and descending colon. Left colonic diverticulosis.  Splenic flexure. Normal.  Transverse colon. Normal.  Hepatic flexure. Normal.  Ascending colon. Normal.  Cecum and ileocecal valve. Normal.  Assessment: Normal screening proctocolonoscopy to the cecum.  Recommendations: Schedule repeat screening colonoscopy in 10 years.

## 2012-08-07 NOTE — Anesthesia Postprocedure Evaluation (Signed)
Anesthesia Post Note  Patient: Briana Foster  Procedure(s) Performed: Procedure(s) (LRB): COLONOSCOPY WITH PROPOFOL (N/A)  Anesthesia type: MAC  Patient location: PACU  Post pain: Pain level controlled  Post assessment: Post-op Vital signs reviewed  Last Vitals:  Filed Vitals:   08/07/12 1137  BP: 139/95  Pulse:   Temp:   Resp: 21    Post vital signs: Reviewed  Level of consciousness: sedated  Complications: No apparent anesthesia complications

## 2012-08-08 ENCOUNTER — Encounter (HOSPITAL_COMMUNITY): Payer: Self-pay | Admitting: Gastroenterology

## 2012-11-23 ENCOUNTER — Emergency Department (INDEPENDENT_AMBULATORY_CARE_PROVIDER_SITE_OTHER): Payer: 59

## 2012-11-23 ENCOUNTER — Emergency Department (HOSPITAL_COMMUNITY)
Admission: EM | Admit: 2012-11-23 | Discharge: 2012-11-23 | Disposition: A | Discharge status: Home / Self Care | Payer: 59 | Source: Home / Self Care | Attending: Family Medicine | Admitting: Family Medicine

## 2012-11-23 ENCOUNTER — Encounter (HOSPITAL_COMMUNITY): Payer: Self-pay | Admitting: Emergency Medicine

## 2012-11-23 DIAGNOSIS — S60229A Contusion of unspecified hand, initial encounter: Secondary | ICD-10-CM

## 2012-11-23 DIAGNOSIS — IMO0002 Reserved for concepts with insufficient information to code with codable children: Secondary | ICD-10-CM

## 2012-11-23 DIAGNOSIS — T148XXA Other injury of unspecified body region, initial encounter: Secondary | ICD-10-CM

## 2012-11-23 DIAGNOSIS — S60222A Contusion of left hand, initial encounter: Secondary | ICD-10-CM

## 2012-11-23 MED ORDER — HYDROMORPHONE HCL 1 MG/ML IJ SOLN
1.0000 mg | Freq: Once | INTRAMUSCULAR | Status: DC
Start: 2012-11-23 — End: 2012-11-23

## 2012-11-23 MED ORDER — IBUPROFEN 800 MG PO TABS
ORAL_TABLET | ORAL | Status: AC
  Filled 2012-11-23: qty 1

## 2012-11-23 MED ORDER — ONDANSETRON 4 MG PO TBDP: 8.0000 mg | ORAL_TABLET | Freq: Once | ORAL | Status: DC

## 2012-11-23 MED ORDER — HYDROCODONE-ACETAMINOPHEN 5-325 MG PO TABS
1.0000 | ORAL_TABLET | Freq: Once | ORAL | Status: AC
  Administered 2012-11-23: 1 via ORAL

## 2012-11-23 MED ORDER — HYDROCODONE-ACETAMINOPHEN 5-325 MG PO TABS
ORAL_TABLET | ORAL | Status: AC
  Filled 2012-11-23: qty 1

## 2012-11-23 MED ORDER — TETANUS-DIPHTH-ACELL PERTUSSIS 5-2.5-18.5 LF-MCG/0.5 IM SUSP
0.5000 mL | Freq: Once | INTRAMUSCULAR | Status: AC
  Administered 2012-11-23: 0.5 mL via INTRAMUSCULAR

## 2012-11-23 MED ORDER — TETANUS-DIPHTH-ACELL PERTUSSIS 5-2.5-18.5 LF-MCG/0.5 IM SUSP
INTRAMUSCULAR | Status: AC
  Filled 2012-11-23: qty 0.5

## 2012-11-23 MED ORDER — IBUPROFEN 800 MG PO TABS
800.0000 mg | ORAL_TABLET | Freq: Once | ORAL | Status: AC
  Administered 2012-11-23: 800 mg via ORAL

## 2012-11-23 MED ORDER — HYDROCODONE-ACETAMINOPHEN 5-325 MG PO TABS: 1.0000 | ORAL_TABLET | ORAL | Status: DC | PRN

## 2012-11-23 NOTE — ED Notes (Signed)
C/o left hand injury due to getting hand wrapped up in dog cable cord earlier today.

## 2012-11-23 NOTE — ED Provider Notes (Signed)
CSN: [id number]     Arrival date & time 11/23/12  1824 History   First MD Initiated Contact with Patient 11/23/12 1855     Chief Complaint  Patient presents with  . Hand Injury    Patient is a 67 y.o. female presenting with hand injury. The history is provided by the patient.  Hand Injury Location:  Hand Time since incident:  6 hours Injury: yes   Mechanism of injury: crush   Crush injury:    Duration of crushing force:  10 seconds Hand location:  Dorsum of L hand Pain details:    Quality:  Throbbing, aching and sharp   Severity:  Severe   Onset quality:  Gradual   Duration:  6 days   Timing:  Constant   Progression:  Worsening Chronicity:  New Dislocation: no   Foreign body present:  No foreign bodies Tetanus status:  Out of date Prior injury to area:  No Relieved by:  None tried Worsened by:  Movement Ineffective treatments:  None tried Pt states that at approx 1300 today she got her (L) hand caught in a dog chain when she attempted to get her large dog off of his chain. Over the last several hours the pain and swelling to the (L) hand has worsened. Current pain score 8/10.   Past Medical History  Diagnosis Date  . Hyperlipidemia   . GERD (gastroesophageal reflux disease)     "comes and goes" - no meds currently  . Depression   . History of hypertension 07-20-12    pt stopped BP med several months ago because BP has been normal-MD is aware  . Restless leg syndrome   . Heart murmur     told as teenager  . Arthritis 07-20-12    hands   Past Surgical History  Procedure Laterality Date  . Tonsillectomy    . Mouth surgery      teeth extractions  . Bunionectomy  20 yrs ago    bil feet  . Bunionectomy  11/30/2011    Procedure: Arbutus Leas;  Surgeon: Drucilla Schmidt, MD;  Location: [facility] ORS;  Service: Orthopedics;  Laterality: Bilateral;  RIGHT FOOT EXCISION OF BUNIONETTE AND PARTIAL   PROXIMAL PHALANGECTOMY OF 5TH TOE LEFT FOOT FUNK BUNIONECTOMY,EXCISION OF  BUNIONETTE   . Tubal ligation    . Colonoscopy with propofol N/A 08/07/2012    Procedure: COLONOSCOPY WITH PROPOFOL;  Surgeon: Charolett Bumpers, MD;  Location: [facility] ENDOSCOPY;  Service: Endoscopy;  Laterality: N/A;   History reviewed. No pertinent family history. History  Substance Use Topics  . Smoking status: Never Smoker   . Smokeless tobacco: Not on file  . Alcohol Use: No   OB History   Grav Para Term Preterm Abortions TAB SAB Ect Mult Living                 Review of Systems  All other systems reviewed and are negative.    Allergies  Apricot flavor  Home Medications   Current Outpatient Rx  Name  Route  Sig  Dispense  Refill  . acetaminophen (TYLENOL) 500 MG tablet   Oral   Take 500 mg by mouth every 6 (six) hours as needed for pain. Pain         . DULoxetine (CYMBALTA) 30 MG capsule   Oral   Take 30 mg by mouth daily before breakfast.         . gabapentin (NEURONTIN) 300 MG capsule   Oral  Take 300 mg by mouth every evening.         Marland Kitchen HYDROcodone-acetaminophen (NORCO/VICODIN) 5-325 MG per tablet   Oral   Take 1 tablet by mouth every 4 (four) hours as needed for pain.   10 tablet   0   . rOPINIRole (REQUIP) 2 MG tablet   Oral   Take 2 mg by mouth at bedtime.          BP 185/95  Pulse 73  Temp(Src) 98.1 F (36.7 C) (Oral)  Resp 16  SpO2 100% Physical Exam  Constitutional: She is oriented to person, place, and time. She appears well-developed and well-nourished.  HENT:  Head: Normocephalic and atraumatic.  Eyes: Conjunctivae are normal.  Cardiovascular: Normal rate.   Pulmonary/Chest: Effort normal.  Musculoskeletal: Normal range of motion.       Hands: Swelling and TTP to distal dorsum of the (L) hand w/ a small superficial skin (approx 2cm)  tear just below swollen area. No active bleeding.  Neurological: She is alert and oriented to person, place, and time.  Skin: Skin is warm and dry.  Psychiatric: She has a normal mood and affect.     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 This is stable. Soft tissue swelling involving the interphalangeal joints both proximal and distal throughout all fingers. Joint space narrowing identified diffusely. No fracture or dislocation. Significant arthropathy at the base of the 1st metacarpal.  IMPRESSION: Severe degenerative change. No acute findings.   Electronically Signed   By: Esperanza Heir M.D.   On: 11/23/2012 19:16    EKG Interpretation     Ventricular Rate:    PR Interval:    QRS Duration:   QT Interval:    QTC Calculation:   R Axis:     Text Interpretation:              MDM   1. Hand contusion, left, initial encounter   2. Skin tear    Crush type injury to dorsum of (L) hand today at approx 1300. Very swollen dorsal aspect of (L) hand and TTP w/ small skin tear. Tetanus not UTD. Steri strips to skin tear w/ bulky dressing, (L) hand XR neg for fx. T-Dap updated. To f/u w/ [place] of PCP for concerns for infection and ortho referral provided for persistent hand pain. Will encouarge 3-5 days of Ibuprofen and short course of Vicodin for pain w/ RICE instructions. Pt agreeable w/ plan.    Leanne Chang, NP 11/23/12 2010  Medical screening examination/treatment/procedure(s) were performed by a resident physician or non-physician practitioner and as the supervising physician I was immediately available for consultation/collaboration.  Clementeen Graham, MD  Rodolph Bong, MD 11/25/12 [phone]

## 2012-12-31 DIAGNOSIS — Z780 Asymptomatic menopausal state: Secondary | ICD-10-CM | POA: Diagnosis not present

## 2012-12-31 DIAGNOSIS — F329 Major depressive disorder, single episode, unspecified: Secondary | ICD-10-CM | POA: Diagnosis not present

## 2012-12-31 DIAGNOSIS — G2581 Restless legs syndrome: Secondary | ICD-10-CM | POA: Diagnosis not present

## 2012-12-31 DIAGNOSIS — K219 Gastro-esophageal reflux disease without esophagitis: Secondary | ICD-10-CM | POA: Diagnosis not present

## 2012-12-31 DIAGNOSIS — E782 Mixed hyperlipidemia: Secondary | ICD-10-CM | POA: Diagnosis not present

## 2012-12-31 DIAGNOSIS — Z23 Encounter for immunization: Secondary | ICD-10-CM | POA: Diagnosis not present

## 2012-12-31 DIAGNOSIS — I10 Essential (primary) hypertension: Secondary | ICD-10-CM | POA: Diagnosis not present

## 2013-04-18 DIAGNOSIS — S335XXA Sprain of ligaments of lumbar spine, initial encounter: Secondary | ICD-10-CM | POA: Diagnosis not present

## 2013-04-24 DIAGNOSIS — K5732 Diverticulitis of large intestine without perforation or abscess without bleeding: Secondary | ICD-10-CM | POA: Diagnosis not present

## 2013-05-01 ENCOUNTER — Other Ambulatory Visit: Payer: Self-pay | Admitting: Internal Medicine

## 2013-05-01 DIAGNOSIS — Z1331 Encounter for screening for depression: Secondary | ICD-10-CM | POA: Diagnosis not present

## 2013-05-01 DIAGNOSIS — E782 Mixed hyperlipidemia: Secondary | ICD-10-CM | POA: Diagnosis not present

## 2013-05-01 DIAGNOSIS — K219 Gastro-esophageal reflux disease without esophagitis: Secondary | ICD-10-CM | POA: Diagnosis not present

## 2013-05-01 DIAGNOSIS — Z0000 Encounter for general adult medical examination without abnormal findings: Secondary | ICD-10-CM | POA: Diagnosis not present

## 2013-05-01 DIAGNOSIS — Z780 Asymptomatic menopausal state: Secondary | ICD-10-CM | POA: Diagnosis not present

## 2013-05-01 DIAGNOSIS — G2581 Restless legs syndrome: Secondary | ICD-10-CM | POA: Diagnosis not present

## 2013-05-01 DIAGNOSIS — Z1231 Encounter for screening mammogram for malignant neoplasm of breast: Secondary | ICD-10-CM

## 2013-05-01 DIAGNOSIS — I10 Essential (primary) hypertension: Secondary | ICD-10-CM | POA: Diagnosis not present

## 2013-05-20 ENCOUNTER — Inpatient Hospital Stay: Admission: RE | Admit: 2013-05-20 | Payer: 59 | Source: Ambulatory Visit

## 2013-06-03 ENCOUNTER — Encounter (INDEPENDENT_AMBULATORY_CARE_PROVIDER_SITE_OTHER): Payer: Self-pay

## 2013-06-03 ENCOUNTER — Ambulatory Visit
Admission: RE | Admit: 2013-06-03 | Discharge: 2013-06-03 | Disposition: A | Discharge status: Home / Self Care | Payer: Medicare Other | Source: Ambulatory Visit | Attending: Internal Medicine | Admitting: Internal Medicine

## 2013-06-03 DIAGNOSIS — Z1231 Encounter for screening mammogram for malignant neoplasm of breast: Secondary | ICD-10-CM | POA: Diagnosis not present

## 2013-06-06 DIAGNOSIS — M549 Dorsalgia, unspecified: Secondary | ICD-10-CM | POA: Diagnosis not present

## 2014-01-02 DIAGNOSIS — J209 Acute bronchitis, unspecified: Secondary | ICD-10-CM | POA: Diagnosis not present

## 2014-01-09 ENCOUNTER — Other Ambulatory Visit: Payer: Self-pay | Admitting: Internal Medicine

## 2014-01-09 ENCOUNTER — Ambulatory Visit
Admission: RE | Admit: 2014-01-09 | Discharge: 2014-01-09 | Disposition: A | Discharge status: Home / Self Care | Payer: Medicare Other | Source: Ambulatory Visit | Attending: Internal Medicine | Admitting: Internal Medicine

## 2014-01-09 DIAGNOSIS — R0989 Other specified symptoms and signs involving the circulatory and respiratory systems: Secondary | ICD-10-CM | POA: Diagnosis not present

## 2014-01-09 DIAGNOSIS — J40 Bronchitis, not specified as acute or chronic: Secondary | ICD-10-CM

## 2014-01-09 DIAGNOSIS — R05 Cough: Secondary | ICD-10-CM | POA: Diagnosis not present

## 2014-03-13 DIAGNOSIS — S9032XA Contusion of left foot, initial encounter: Secondary | ICD-10-CM | POA: Diagnosis not present

## 2014-04-18 DIAGNOSIS — S9032XD Contusion of left foot, subsequent encounter: Secondary | ICD-10-CM | POA: Diagnosis not present

## 2014-04-29 DIAGNOSIS — S9032XD Contusion of left foot, subsequent encounter: Secondary | ICD-10-CM | POA: Diagnosis not present

## 2014-05-06 DIAGNOSIS — S9032XD Contusion of left foot, subsequent encounter: Secondary | ICD-10-CM | POA: Diagnosis not present

## 2014-06-23 DIAGNOSIS — R51 Headache: Secondary | ICD-10-CM | POA: Diagnosis not present

## 2014-07-07 DIAGNOSIS — Z1389 Encounter for screening for other disorder: Secondary | ICD-10-CM | POA: Diagnosis not present

## 2014-07-07 DIAGNOSIS — F419 Anxiety disorder, unspecified: Secondary | ICD-10-CM | POA: Diagnosis not present

## 2014-07-07 DIAGNOSIS — G2581 Restless legs syndrome: Secondary | ICD-10-CM | POA: Diagnosis not present

## 2014-07-07 DIAGNOSIS — E782 Mixed hyperlipidemia: Secondary | ICD-10-CM | POA: Diagnosis not present

## 2014-07-07 DIAGNOSIS — I10 Essential (primary) hypertension: Secondary | ICD-10-CM | POA: Diagnosis not present

## 2014-08-05 ENCOUNTER — Emergency Department (HOSPITAL_COMMUNITY)
Admission: EM | Admit: 2014-08-05 | Discharge: 2014-08-05 | Disposition: A | Discharge status: Home / Self Care | Payer: Medicare Other | Attending: Emergency Medicine | Admitting: Emergency Medicine

## 2014-08-05 ENCOUNTER — Emergency Department (HOSPITAL_COMMUNITY): Payer: Medicare Other

## 2014-08-05 ENCOUNTER — Encounter (HOSPITAL_COMMUNITY): Payer: Self-pay | Admitting: Emergency Medicine

## 2014-08-05 DIAGNOSIS — L509 Urticaria, unspecified: Secondary | ICD-10-CM | POA: Diagnosis not present

## 2014-08-05 DIAGNOSIS — R21 Rash and other nonspecific skin eruption: Secondary | ICD-10-CM | POA: Insufficient documentation

## 2014-08-05 DIAGNOSIS — Y929 Unspecified place or not applicable: Secondary | ICD-10-CM | POA: Insufficient documentation

## 2014-08-05 DIAGNOSIS — T63441A Toxic effect of venom of bees, accidental (unintentional), initial encounter: Secondary | ICD-10-CM | POA: Insufficient documentation

## 2014-08-05 DIAGNOSIS — Y999 Unspecified external cause status: Secondary | ICD-10-CM | POA: Diagnosis not present

## 2014-08-05 DIAGNOSIS — Z8719 Personal history of other diseases of the digestive system: Secondary | ICD-10-CM | POA: Diagnosis not present

## 2014-08-05 DIAGNOSIS — R011 Cardiac murmur, unspecified: Secondary | ICD-10-CM | POA: Diagnosis not present

## 2014-08-05 DIAGNOSIS — I10 Essential (primary) hypertension: Secondary | ICD-10-CM | POA: Insufficient documentation

## 2014-08-05 DIAGNOSIS — R062 Wheezing: Secondary | ICD-10-CM | POA: Diagnosis not present

## 2014-08-05 DIAGNOSIS — T63441S Toxic effect of venom of bees, accidental (unintentional), sequela: Secondary | ICD-10-CM

## 2014-08-05 DIAGNOSIS — Y939 Activity, unspecified: Secondary | ICD-10-CM | POA: Insufficient documentation

## 2014-08-05 DIAGNOSIS — Z8659 Personal history of other mental and behavioral disorders: Secondary | ICD-10-CM | POA: Insufficient documentation

## 2014-08-05 DIAGNOSIS — M1990 Unspecified osteoarthritis, unspecified site: Secondary | ICD-10-CM | POA: Insufficient documentation

## 2014-08-05 DIAGNOSIS — Z79899 Other long term (current) drug therapy: Secondary | ICD-10-CM | POA: Insufficient documentation

## 2014-08-05 DIAGNOSIS — Z8639 Personal history of other endocrine, nutritional and metabolic disease: Secondary | ICD-10-CM | POA: Diagnosis not present

## 2014-08-05 DIAGNOSIS — T63484A Toxic effect of venom of other arthropod, undetermined, initial encounter: Secondary | ICD-10-CM | POA: Diagnosis not present

## 2014-08-05 DIAGNOSIS — R0602 Shortness of breath: Secondary | ICD-10-CM | POA: Insufficient documentation

## 2014-08-05 DIAGNOSIS — T63301A Toxic effect of unspecified spider venom, accidental (unintentional), initial encounter: Secondary | ICD-10-CM | POA: Diagnosis not present

## 2014-08-05 DIAGNOSIS — R0789 Other chest pain: Secondary | ICD-10-CM | POA: Diagnosis not present

## 2014-08-05 LAB — BASIC METABOLIC PANEL
Anion gap: 8 (ref 5–15)
BUN: 11 mg/dL (ref 6–20)
CALCIUM: 9.4 mg/dL (ref 8.9–10.3)
CO2: 24 mmol/L (ref 22–32)
Chloride: 109 mmol/L (ref 101–111)
Creatinine, Ser: 0.9 mg/dL (ref 0.44–1.00)
GLUCOSE: 149 mg/dL — AB (ref 65–99)
Potassium: 3.6 mmol/L (ref 3.5–5.1)
SODIUM: 141 mmol/L (ref 135–145)

## 2014-08-05 LAB — I-STAT TROPONIN, ED: TROPONIN I, POC: 0.04 ng/mL (ref 0.00–0.08)

## 2014-08-05 LAB — BRAIN NATRIURETIC PEPTIDE: B Natriuretic Peptide: 63.5 pg/mL (ref 0.0–100.0)

## 2014-08-05 LAB — CBC WITH DIFFERENTIAL/PLATELET
Basophils Absolute: 0 10*3/uL (ref 0.0–0.1)
Basophils Relative: 0 % (ref 0–1)
Eosinophils Absolute: 0 10*3/uL (ref 0.0–0.7)
Eosinophils Relative: 0 % (ref 0–5)
HEMATOCRIT: 38.3 % (ref 36.0–46.0)
HEMOGLOBIN: 12.7 g/dL (ref 12.0–15.0)
LYMPHS ABS: 1.5 10*3/uL (ref 0.7–4.0)
Lymphocytes Relative: 11 % — ABNORMAL LOW (ref 12–46)
MCH: 29.8 pg (ref 26.0–34.0)
MCHC: 33.2 g/dL (ref 30.0–36.0)
MCV: 89.9 fL (ref 78.0–100.0)
MONOS PCT: 5 % (ref 3–12)
Monocytes Absolute: 0.7 10*3/uL (ref 0.1–1.0)
NEUTROS ABS: 12.1 10*3/uL — AB (ref 1.7–7.7)
NEUTROS PCT: 84 % — AB (ref 43–77)
Platelets: 214 10*3/uL (ref 150–400)
RBC: 4.26 MIL/uL (ref 3.87–5.11)
RDW: 12.9 % (ref 11.5–15.5)
WBC: 14.4 10*3/uL — ABNORMAL HIGH (ref 4.0–10.5)

## 2014-08-05 MED ORDER — PREDNISONE 20 MG PO TABS: 40.0000 mg | ORAL_TABLET | Freq: Every day | ORAL | Status: DC

## 2014-08-05 MED ORDER — DIPHENHYDRAMINE HCL 25 MG PO CAPS
25.0000 mg | ORAL_CAPSULE | Freq: Once | ORAL | Status: AC
  Administered 2014-08-05: 25 mg via ORAL
  Filled 2014-08-05: qty 1

## 2014-08-05 MED ORDER — DIPHENHYDRAMINE HCL 25 MG PO CAPS: 25.0000 mg | ORAL_CAPSULE | Freq: Four times a day (QID) | ORAL | Status: DC | PRN

## 2014-08-05 MED ORDER — FAMOTIDINE 20 MG PO TABS
10.0000 mg | ORAL_TABLET | Freq: Once | ORAL | Status: AC
  Administered 2014-08-05: 10 mg via ORAL
  Filled 2014-08-05: qty 1

## 2014-08-05 NOTE — ED Notes (Signed)
Patient's daughter states that the patient had SOB prior to insect bite and is suppose to see PCP tomorrow regarding SOB.

## 2014-08-05 NOTE — ED Notes (Signed)
Ambulated in the hall without O2- Sats 95%.

## 2014-08-05 NOTE — ED Notes (Signed)
Per EMS pt was stung by a black insect today, widespread hives across legs, arms, abdomen, chest, diffuse wheezing present bilaterally. EMS administered 10 mg albuterol and 0.5 mg Atrovent via nebulizer, 50 mg benadryl IV, 50 mg zantac IV, 125 mg solu-medrol IV.

## 2014-08-05 NOTE — Discharge Instructions (Signed)
We saw you in the ER after you had the allergic reaction.  °The reaction is mild to moderate and it appears to be in control and there is no increased swelling or any difficulty in breathing noted. ° °Please take the medications prescribed. °PLEASE RETURN TO THE ER IMMEDIATELY IN CASE YOU START HAVING WORSENING SWELLING, DIFFICULTY IN BREATHING ETC. ° °Allergies ° Allergies may happen from anything your body is sensitive to. This may be food, medicines, pollens, chemicals, and many other things. Food allergies can be severe and deadly.  °HOME CARE °· If you do not know what causes a reaction, keep a diary. Write down the foods you ate and the symptoms that followed. Avoid foods that cause reactions. °· If you have red raised spots (hives) or a rash: °¨ Take medicine as told by your doctor. °¨ Use medicines for red raised spots and itching as needed. °¨ Apply cold cloths (compresses) to the skin. Take a cool bath. Avoid hot baths or showers. °· If you are severely allergic: °¨ It is often necessary to go to the hospital after you have treated your reaction. °¨ Wear your medical alert jewelry. °¨ You and your family must learn how to give a allergy shot or use an allergy kit (anaphylaxis kit). °¨ Always carry your allergy kit or shot with you. Use this medicine as told by your doctor if a severe reaction is occurring. °GET HELP RIGHT AWAY IF: °· You have trouble breathing or are making high-pitched whistling sounds (wheezing). °· You have a tight feeling in your chest or throat. °· You have a puffy (swollen) mouth. °· You have red raised spots, puffiness (swelling), or itching all over your body. °· You have had a severe reaction that was helped by your allergy kit or shot. The reaction can return once the medicine has worn off. °· You think you are having a food allergy. Symptoms most often happen within 30 minutes of eating a food. °· Your symptoms have not gone away within 2 days or are getting worse. °· You have  new symptoms. °· You want to retest yourself with a food or drink you think causes an allergic reaction. Only do this under the care of a doctor. °MAKE SURE YOU:  °· Understand these instructions. °· Will watch your condition. °· Will get help right away if you are not doing well or get worse. °Document Released: 05/14/2012 Document Reviewed: 05/14/2012 °[employer]® Patient Information ©2015 [employer]. This information is not intended to replace advice given to you by your health care provider. Make sure you discuss any questions you have with your health care provider. ° °

## 2014-08-05 NOTE — ED Notes (Signed)
Bed: NW29 Expected date:  Expected time:  Means of arrival:  Comments: Allergic rxn

## 2014-08-05 NOTE — ED Provider Notes (Signed)
CSN: [id number]     Arrival date & time 08/05/14  1432 History   First MD Initiated Contact with Patient 08/05/14 1506     Chief Complaint  Patient presents with  . Allergic Reaction  . Insect Bite     (Consider location/radiation/quality/duration/timing/severity/associated sxs/prior Treatment) HPI Comments: Pt comes in with cc of allergic rxn. She reports that she had a bee sting around 1 pm. She immediately broke out in hives, with itching and started having chest tightness and some trouble breathing. EMT was called, and she received steroids, breathing tx and benadryl, and her sx have improved. No trouble swallowing. No hx of similar sx.   Patient is a 69 y.o. female presenting with allergic reaction. The history is provided by the patient.  Allergic Reaction Presenting symptoms: rash and wheezing   Presenting symptoms: no difficulty swallowing     Past Medical History  Diagnosis Date  . Hyperlipidemia   . GERD (gastroesophageal reflux disease)     "comes and goes" - no meds currently  . Depression   . History of hypertension 07-20-12    pt stopped BP med several months ago because BP has been normal-MD is aware  . Restless leg syndrome   . Heart murmur     told as teenager  . Arthritis 07-20-12    hands   Past Surgical History  Procedure Laterality Date  . Tonsillectomy    . Mouth surgery      teeth extractions  . Bunionectomy  20 yrs ago    bil feet  . Bunionectomy  11/30/2011    Procedure: Arbutus Leas;  Surgeon: Drucilla Schmidt, MD;  Location: [facility];  Service: Orthopedics;  Laterality: Bilateral;  RIGHT FOOT EXCISION OF BUNIONETTE AND PARTIAL   PROXIMAL PHALANGECTOMY OF 5TH TOE LEFT FOOT FUNK BUNIONECTOMY,EXCISION OF BUNIONETTE   . Tubal ligation    . Colonoscopy with propofol N/A 08/07/2012    Procedure: COLONOSCOPY WITH PROPOFOL;  Surgeon: Charolett Bumpers, MD;  Location: [facility];  Service: Endoscopy;  Laterality: N/A;   History reviewed. No pertinent  family history. History  Substance Use Topics  . Smoking status: Never Smoker   . Smokeless tobacco: Not on file  . Alcohol Use: No   OB History    No data available     Review of Systems  HENT: Negative for facial swelling, trouble swallowing and voice change.   Respiratory: Positive for chest tightness, shortness of breath and wheezing. Negative for stridor.   Cardiovascular: Positive for chest pain.  Skin: Positive for rash.      Allergies  Apricot flavor  Home Medications   Prior to Admission medications   Medication Sig Start Date End Date Taking? Authorizing Provider  acetaminophen (TYLENOL) 500 MG tablet Take 500 mg by mouth every 6 (six) hours as needed for pain. Pain   Yes Historical Provider, MD  amLODipine-benazepril (LOTREL) 5-10 MG per capsule Take 1 capsule by mouth daily.   Yes Historical Provider, MD  rOPINIRole (REQUIP) 2 MG tablet Take 2 mg by mouth at bedtime.   Yes Historical Provider, MD  diphenhydrAMINE (BENADRYL) 25 mg capsule Take 1 capsule (25 mg total) by mouth every 6 (six) hours as needed for itching. 08/05/14   Derwood Kaplan, MD  predniSONE (DELTASONE) 20 MG tablet Take 2 tablets (40 mg total) by mouth daily. 08/05/14   Jaeleah Smyser, MD   BP 138/62 mmHg  Pulse 88  Temp(Src) 97.4 F (36.3 C) (Oral)  Resp 23  SpO2  93% Physical Exam  Constitutional: She is oriented to person, place, and time. She appears well-developed and well-nourished.  HENT:  Head: Normocephalic and atraumatic.  Eyes: EOM are normal. Pupils are equal, round, and reactive to light.  Neck: Neck supple.  Cardiovascular: Normal rate, regular rhythm and normal heart sounds.   No murmur heard. Pulmonary/Chest: Effort normal. No respiratory distress. She has no wheezes. She has rales.  Abdominal: Soft. She exhibits no distension. There is no tenderness. There is no rebound and no guarding.  Neurological: She is alert and oriented to person, place, and time.  Skin: Skin is warm  and dry. Rash noted.  Diffuse erythematous patches  Nursing note and vitals reviewed.   ED Course  Procedures (including critical care time) Labs Review Labs Reviewed  CBC WITH DIFFERENTIAL/PLATELET - Abnormal; Notable for the following:    WBC 14.4 (*)    Neutrophils Relative % 84 (*)    Neutro Abs 12.1 (*)    Lymphocytes Relative 11 (*)    All other components within normal limits  BASIC METABOLIC PANEL - Abnormal; Notable for the following:    Glucose, Bld 149 (*)    All other components within normal limits  BRAIN NATRIURETIC PEPTIDE  I-STAT TROPOININ, ED    Imaging Review Dg Chest 2 View  08/05/2014   CLINICAL DATA:  Status post insect bite on the forearm without development of hives, swelling, itching, shortness of breath, and dizziness.  EXAM: CHEST  2 VIEW  COMPARISON:  PA and lateral chest of January 09, 2014  FINDINGS: The lungs are adequately inflated. The interstitial markings are mildly prominent but this is not new. There is no alveolar infiltrate. There is no pleural effusion. There is stable apical pleural thickening on the left. The heart and mediastinal structures are normal. The trachea is midline. There is mild multilevel degenerative disc disease of the thoracic spine.  IMPRESSION: There is no active cardiopulmonary disease.   Electronically Signed   By: David  [place] M.D.   On: 08/05/2014 17:00     EKG Interpretation None      MDM   Final diagnoses:  Chest tightness  Allergic reaction to bee sting, accidental or unintentional, sequela    PT comes in with cc of allergic type reaction. She reports some dib prior to the event as well, and had some crackles on the exam - so blood work and cxr was undertaken which are neg. Pt has no airway issues and we observed her in the ER for upto 4 hours post incident, and she is doing well. Pt is stable for d.c.   Derwood Kaplan, MD 08/05/14 [id number]

## 2014-08-06 DIAGNOSIS — Z1389 Encounter for screening for other disorder: Secondary | ICD-10-CM | POA: Diagnosis not present

## 2014-08-06 DIAGNOSIS — G2581 Restless legs syndrome: Secondary | ICD-10-CM | POA: Diagnosis not present

## 2014-08-06 DIAGNOSIS — Z0000 Encounter for general adult medical examination without abnormal findings: Secondary | ICD-10-CM | POA: Diagnosis not present

## 2014-08-06 DIAGNOSIS — J309 Allergic rhinitis, unspecified: Secondary | ICD-10-CM | POA: Diagnosis not present

## 2014-08-06 DIAGNOSIS — M1990 Unspecified osteoarthritis, unspecified site: Secondary | ICD-10-CM | POA: Diagnosis not present

## 2014-08-06 DIAGNOSIS — F325 Major depressive disorder, single episode, in full remission: Secondary | ICD-10-CM | POA: Diagnosis not present

## 2014-08-06 DIAGNOSIS — D649 Anemia, unspecified: Secondary | ICD-10-CM | POA: Diagnosis not present

## 2014-08-06 DIAGNOSIS — I10 Essential (primary) hypertension: Secondary | ICD-10-CM | POA: Diagnosis not present

## 2014-08-06 DIAGNOSIS — Z91038 Other insect allergy status: Secondary | ICD-10-CM | POA: Diagnosis not present

## 2014-08-06 DIAGNOSIS — K219 Gastro-esophageal reflux disease without esophagitis: Secondary | ICD-10-CM | POA: Diagnosis not present

## 2014-08-06 DIAGNOSIS — E782 Mixed hyperlipidemia: Secondary | ICD-10-CM | POA: Diagnosis not present

## 2014-08-06 DIAGNOSIS — Z23 Encounter for immunization: Secondary | ICD-10-CM | POA: Diagnosis not present

## 2014-08-26 DIAGNOSIS — Z780 Asymptomatic menopausal state: Secondary | ICD-10-CM | POA: Diagnosis not present

## 2014-11-03 DIAGNOSIS — J069 Acute upper respiratory infection, unspecified: Secondary | ICD-10-CM | POA: Diagnosis not present

## 2015-03-04 DIAGNOSIS — M47817 Spondylosis without myelopathy or radiculopathy, lumbosacral region: Secondary | ICD-10-CM | POA: Diagnosis not present

## 2015-03-04 DIAGNOSIS — M549 Dorsalgia, unspecified: Secondary | ICD-10-CM | POA: Diagnosis not present

## 2015-03-04 DIAGNOSIS — M791 Myalgia: Secondary | ICD-10-CM | POA: Diagnosis not present

## 2015-04-15 DIAGNOSIS — Z6830 Body mass index (BMI) 30.0-30.9, adult: Secondary | ICD-10-CM | POA: Diagnosis not present

## 2015-04-15 DIAGNOSIS — M47817 Spondylosis without myelopathy or radiculopathy, lumbosacral region: Secondary | ICD-10-CM | POA: Diagnosis not present

## 2015-04-15 DIAGNOSIS — M791 Myalgia: Secondary | ICD-10-CM | POA: Diagnosis not present

## 2015-04-15 DIAGNOSIS — I10 Essential (primary) hypertension: Secondary | ICD-10-CM | POA: Diagnosis not present

## 2015-04-16 ENCOUNTER — Other Ambulatory Visit: Payer: Self-pay | Admitting: Orthopaedic Surgery

## 2015-04-16 DIAGNOSIS — M47817 Spondylosis without myelopathy or radiculopathy, lumbosacral region: Secondary | ICD-10-CM

## 2015-04-27 ENCOUNTER — Ambulatory Visit
Admission: RE | Admit: 2015-04-27 | Discharge: 2015-04-27 | Disposition: A | Discharge status: Home / Self Care | Payer: Medicare Other | Source: Ambulatory Visit | Attending: Orthopaedic Surgery | Admitting: Orthopaedic Surgery

## 2015-04-27 DIAGNOSIS — M47817 Spondylosis without myelopathy or radiculopathy, lumbosacral region: Secondary | ICD-10-CM

## 2015-04-27 DIAGNOSIS — M5126 Other intervertebral disc displacement, lumbar region: Secondary | ICD-10-CM | POA: Diagnosis not present

## 2015-05-04 DIAGNOSIS — J069 Acute upper respiratory infection, unspecified: Secondary | ICD-10-CM | POA: Diagnosis not present

## 2015-05-28 DIAGNOSIS — M791 Myalgia: Secondary | ICD-10-CM | POA: Diagnosis not present

## 2015-05-28 DIAGNOSIS — M47817 Spondylosis without myelopathy or radiculopathy, lumbosacral region: Secondary | ICD-10-CM | POA: Diagnosis not present

## 2015-07-22 DIAGNOSIS — H9202 Otalgia, left ear: Secondary | ICD-10-CM | POA: Diagnosis not present

## 2015-09-14 ENCOUNTER — Other Ambulatory Visit: Payer: Self-pay | Admitting: Internal Medicine

## 2015-09-14 DIAGNOSIS — R410 Disorientation, unspecified: Secondary | ICD-10-CM | POA: Diagnosis not present

## 2015-09-14 DIAGNOSIS — R51 Headache: Principal | ICD-10-CM

## 2015-09-14 DIAGNOSIS — F419 Anxiety disorder, unspecified: Secondary | ICD-10-CM | POA: Diagnosis not present

## 2015-09-14 DIAGNOSIS — E782 Mixed hyperlipidemia: Secondary | ICD-10-CM | POA: Diagnosis not present

## 2015-09-14 DIAGNOSIS — R413 Other amnesia: Secondary | ICD-10-CM

## 2015-09-14 DIAGNOSIS — R519 Headache, unspecified: Secondary | ICD-10-CM

## 2015-09-14 DIAGNOSIS — I10 Essential (primary) hypertension: Secondary | ICD-10-CM | POA: Diagnosis not present

## 2015-09-15 ENCOUNTER — Ambulatory Visit
Admission: RE | Admit: 2015-09-15 | Discharge: 2015-09-15 | Disposition: A | Discharge status: Home / Self Care | Payer: Medicare Other | Source: Ambulatory Visit | Attending: Internal Medicine | Admitting: Internal Medicine

## 2015-09-15 DIAGNOSIS — R51 Headache: Secondary | ICD-10-CM | POA: Diagnosis not present

## 2015-09-15 DIAGNOSIS — R519 Headache, unspecified: Secondary | ICD-10-CM

## 2015-09-15 DIAGNOSIS — R413 Other amnesia: Secondary | ICD-10-CM

## 2015-09-15 DIAGNOSIS — R410 Disorientation, unspecified: Secondary | ICD-10-CM

## 2015-09-16 DIAGNOSIS — E538 Deficiency of other specified B group vitamins: Secondary | ICD-10-CM | POA: Diagnosis not present

## 2015-09-17 DIAGNOSIS — E538 Deficiency of other specified B group vitamins: Secondary | ICD-10-CM | POA: Diagnosis not present

## 2015-09-21 DIAGNOSIS — E538 Deficiency of other specified B group vitamins: Secondary | ICD-10-CM | POA: Diagnosis not present

## 2015-09-22 DIAGNOSIS — E538 Deficiency of other specified B group vitamins: Secondary | ICD-10-CM | POA: Diagnosis not present

## 2015-09-23 DIAGNOSIS — E538 Deficiency of other specified B group vitamins: Secondary | ICD-10-CM | POA: Diagnosis not present

## 2015-10-07 DIAGNOSIS — E538 Deficiency of other specified B group vitamins: Secondary | ICD-10-CM | POA: Diagnosis not present

## 2015-10-19 ENCOUNTER — Other Ambulatory Visit: Payer: Self-pay | Admitting: Internal Medicine

## 2015-10-19 DIAGNOSIS — F325 Major depressive disorder, single episode, in full remission: Secondary | ICD-10-CM | POA: Diagnosis not present

## 2015-10-19 DIAGNOSIS — R7309 Other abnormal glucose: Secondary | ICD-10-CM | POA: Diagnosis not present

## 2015-10-19 DIAGNOSIS — Z0000 Encounter for general adult medical examination without abnormal findings: Secondary | ICD-10-CM | POA: Diagnosis not present

## 2015-10-19 DIAGNOSIS — Z23 Encounter for immunization: Secondary | ICD-10-CM | POA: Diagnosis not present

## 2015-10-19 DIAGNOSIS — R413 Other amnesia: Secondary | ICD-10-CM | POA: Diagnosis not present

## 2015-10-19 DIAGNOSIS — E538 Deficiency of other specified B group vitamins: Secondary | ICD-10-CM | POA: Diagnosis not present

## 2015-10-19 DIAGNOSIS — E782 Mixed hyperlipidemia: Secondary | ICD-10-CM | POA: Diagnosis not present

## 2015-10-19 DIAGNOSIS — J309 Allergic rhinitis, unspecified: Secondary | ICD-10-CM | POA: Diagnosis not present

## 2015-10-19 DIAGNOSIS — I10 Essential (primary) hypertension: Secondary | ICD-10-CM | POA: Diagnosis not present

## 2015-10-19 DIAGNOSIS — Z1389 Encounter for screening for other disorder: Secondary | ICD-10-CM | POA: Diagnosis not present

## 2015-10-19 DIAGNOSIS — Z1231 Encounter for screening mammogram for malignant neoplasm of breast: Secondary | ICD-10-CM

## 2015-10-19 DIAGNOSIS — G2581 Restless legs syndrome: Secondary | ICD-10-CM | POA: Diagnosis not present

## 2015-10-21 ENCOUNTER — Ambulatory Visit
Admission: RE | Admit: 2015-10-21 | Discharge: 2015-10-21 | Disposition: A | Discharge status: Home / Self Care | Payer: Medicare Other | Source: Ambulatory Visit | Attending: Internal Medicine | Admitting: Internal Medicine

## 2015-10-21 DIAGNOSIS — Z1231 Encounter for screening mammogram for malignant neoplasm of breast: Secondary | ICD-10-CM

## 2015-10-28 ENCOUNTER — Ambulatory Visit: Payer: Medicare Other

## 2015-11-02 DIAGNOSIS — E538 Deficiency of other specified B group vitamins: Secondary | ICD-10-CM | POA: Diagnosis not present

## 2015-11-16 DIAGNOSIS — I10 Essential (primary) hypertension: Secondary | ICD-10-CM | POA: Diagnosis not present

## 2015-11-16 DIAGNOSIS — E538 Deficiency of other specified B group vitamins: Secondary | ICD-10-CM | POA: Diagnosis not present

## 2015-12-22 DIAGNOSIS — E538 Deficiency of other specified B group vitamins: Secondary | ICD-10-CM | POA: Diagnosis not present

## 2016-01-27 DIAGNOSIS — M549 Dorsalgia, unspecified: Secondary | ICD-10-CM | POA: Diagnosis not present

## 2016-01-27 DIAGNOSIS — M519 Unspecified thoracic, thoracolumbar and lumbosacral intervertebral disc disorder: Secondary | ICD-10-CM | POA: Diagnosis not present

## 2016-02-22 DIAGNOSIS — J069 Acute upper respiratory infection, unspecified: Secondary | ICD-10-CM | POA: Diagnosis not present

## 2016-03-16 ENCOUNTER — Emergency Department (HOSPITAL_COMMUNITY)
Admission: EM | Admit: 2016-03-16 | Discharge: 2016-03-17 | Disposition: A | Discharge status: Home / Self Care | Payer: Medicare Other | Attending: Emergency Medicine | Admitting: Emergency Medicine

## 2016-03-16 ENCOUNTER — Encounter (HOSPITAL_COMMUNITY): Payer: Self-pay | Admitting: Emergency Medicine

## 2016-03-16 ENCOUNTER — Emergency Department (HOSPITAL_COMMUNITY): Payer: Medicare Other

## 2016-03-16 DIAGNOSIS — R42 Dizziness and giddiness: Secondary | ICD-10-CM | POA: Diagnosis not present

## 2016-03-16 DIAGNOSIS — R791 Abnormal coagulation profile: Secondary | ICD-10-CM | POA: Diagnosis not present

## 2016-03-16 DIAGNOSIS — R110 Nausea: Secondary | ICD-10-CM | POA: Insufficient documentation

## 2016-03-16 DIAGNOSIS — I10 Essential (primary) hypertension: Secondary | ICD-10-CM | POA: Insufficient documentation

## 2016-03-16 DIAGNOSIS — R079 Chest pain, unspecified: Secondary | ICD-10-CM | POA: Diagnosis not present

## 2016-03-16 DIAGNOSIS — Z79899 Other long term (current) drug therapy: Secondary | ICD-10-CM | POA: Insufficient documentation

## 2016-03-16 LAB — CBC
HCT: 36.4 % (ref 36.0–46.0)
Hemoglobin: 12.3 g/dL (ref 12.0–15.0)
MCH: 30.6 pg (ref 26.0–34.0)
MCHC: 33.8 g/dL (ref 30.0–36.0)
MCV: 90.5 fL (ref 78.0–100.0)
PLATELETS: 222 10*3/uL (ref 150–400)
RBC: 4.02 MIL/uL (ref 3.87–5.11)
RDW: 13 % (ref 11.5–15.5)
WBC: 6.8 10*3/uL (ref 4.0–10.5)

## 2016-03-16 LAB — URINALYSIS, ROUTINE W REFLEX MICROSCOPIC
Bacteria, UA: NONE SEEN
Bilirubin Urine: NEGATIVE
GLUCOSE, UA: NEGATIVE mg/dL
Hgb urine dipstick: NEGATIVE
KETONES UR: NEGATIVE mg/dL
NITRITE: NEGATIVE
Protein, ur: NEGATIVE mg/dL
Specific Gravity, Urine: 1.009 (ref 1.005–1.030)
pH: 6 (ref 5.0–8.0)

## 2016-03-16 LAB — BASIC METABOLIC PANEL
Anion gap: 12 (ref 5–15)
BUN: 14 mg/dL (ref 6–20)
CHLORIDE: 105 mmol/L (ref 101–111)
CO2: 22 mmol/L (ref 22–32)
CREATININE: 0.86 mg/dL (ref 0.44–1.00)
Calcium: 9.7 mg/dL (ref 8.9–10.3)
GFR calc non Af Amer: >60 mL/min (ref >60)
Glucose, Bld: 176 mg/dL — ABNORMAL HIGH (ref 65–99)
POTASSIUM: 3.9 mmol/L (ref 3.5–5.1)
Sodium: 139 mmol/L (ref 135–145)

## 2016-03-16 LAB — PROTIME-INR
INR: 1.05
PROTHROMBIN TIME: 13.7 s (ref 11.4–15.2)

## 2016-03-16 LAB — I-STAT TROPONIN, ED: Troponin i, poc: 0 ng/mL (ref 0.00–0.08)

## 2016-03-16 MED ORDER — LORAZEPAM 2 MG/ML IJ SOLN
1.0000 mg | Freq: Once | INTRAMUSCULAR | Status: AC
  Administered 2016-03-16: 1 mg via INTRAVENOUS
  Filled 2016-03-16: qty 1

## 2016-03-16 MED ORDER — MECLIZINE HCL 25 MG PO TABS
25.0000 mg | ORAL_TABLET | Freq: Once | ORAL | Status: AC
  Administered 2016-03-17: 25 mg via ORAL
  Filled 2016-03-16: qty 1

## 2016-03-16 MED ORDER — ROPINIROLE HCL 1 MG PO TABS
3.0000 mg | ORAL_TABLET | Freq: Every day | ORAL | Status: DC
  Administered 2016-03-16: 3 mg via ORAL
  Filled 2016-03-16: qty 3

## 2016-03-16 MED ORDER — MECLIZINE HCL 25 MG PO TABS: 25.0000 mg | ORAL_TABLET | Freq: Three times a day (TID) | ORAL | 0 refills | Status: DC | PRN

## 2016-03-16 NOTE — ED Notes (Signed)
Pt states she felt very dizzy while lying, sitting, and standing no other complaints noted at this time

## 2016-03-16 NOTE — Consult Note (Signed)
Neurology Consultation Reason for Consult: Vertigo Referring Physician: Rosalia Hammers, D  CC: Vertigo  History is obtained from:patient  HPI: Briana Foster is a 71 y.o. female with vertigo that started acutely sometime after 5:30pm. She states taht she feels "drunk" with some sensation of movement. She has some difficulty walking and therefore came to the ER. There is question that she may have had a similar episode several weeks ago that resolved. She denies numbness or weakness. She has some chronic trouble with right leg weakness, but this is not new.    LKW: 5:30 pm. tpa given?: no,  Mild symptoms   ROS: A 14 point ROS was performed and is negative except as noted in the HPI.   Past Medical History:  Diagnosis Date  . Arthritis 07-20-12   hands  . Depression   . GERD (gastroesophageal reflux disease)    "comes and goes" - no meds currently  . Heart murmur    told as teenager  . History of hypertension 07-20-12   pt stopped BP med several months ago because BP has been normal-MD is aware  . Hyperlipidemia   . Restless leg syndrome     FHx: Mother - stroke   Social History:  reports that she has never smoked. She has never used smokeless tobacco. She reports that she does not drink alcohol or use drugs.   Exam: Current vital signs: BP 153/82 (BP Location: Left Arm)   Pulse 94   Resp 22   SpO2 93% Comment: Simultaneous filing. User may not have seen previous data. Vital signs in last 24 hours: Pulse Rate:  [94] 94 (02/14 2028) Resp:  [22] 22 (02/14 2028) BP: (153)/(82) 153/82 (02/14 2028) SpO2:  [93 %] 93 % (02/14 2028)   Physical Exam  Constitutional: Appears well-developed and well-nourished.  Psych: Affect appropriate to situation Eyes: No scleral injection HENT: No OP obstrucion Head: Normocephalic.  Cardiovascular: Normal rate and regular rhythm.  Respiratory: Effort normal and breath sounds normal to anterior ascultation GI: Soft.  No distension. There is no  tenderness.  Skin: WDI  Neuro: Mental Status: Patient is awake, alert, oriented to person, place, month, year, and situation. Patient is able to give a clear and coherent history. No signs of aphasia or neglect Cranial Nerves: II: Visual Fields are full. Pupils are equal, round, and reactive to light.   III,IV, VI: EOMI without ptosis or diploplia. No abnormal nystagmus(mild end gaze nystagmus that is not persistent). Normal saccades V: Facial sensation is symmetric to temperature VII: Facial movement is symmetric.  VIII: hearing is intact to voice X: Uvula elevates symmetrically XI: Shoulder shrug is symmetric. XII: tongue is midline without atrophy or fasciculations.  Motor: Tone is normal. Bulk is normal. 5/5 strength was present in bilateral arms and left leg. Mild right leg proximal weakness(4+/5) without drift.  Sensory: Sensation is symmetric to light touch and temperature in the arms and legs. Cerebellar: FNF  intact bilaterally, HKS intact on the left, mild difficulty with HKS on the right, though unclear if due to mild ataxia or weakness.  Gait: She is able to stand, though has prominent dysequilibrium. With help, she is able to walk a few steps but feels "drunk"  I have reviewed labs in epic and the results pertinent to this consultation are: BMP - unrmarkable  Impression: 71 yo F with mild vertigo. No definite findings on exam. I discussed risks and benefits of IV tPA in the 3-4.5 hour window with the patient, and  indicated that with the mild nature of her symptoms, I would not recommend this at this time. She indicated understanding.   Recommendations: 1) MRI brain w/o contrast with thin cuts through cerebellum 2) Stroke workup if positive.   Ritta Slot, MD [facility] [phone]  If 7pm- 7am, please page neurology on call as listed in AMION.

## 2016-03-16 NOTE — ED Provider Notes (Signed)
[facility]-EMER[facility]ENCY DEPT Provider Note   CSN: [id number] Arrival date & time: 03/16/16  2021     History   Chief Complaint Chief Complaint  Patient presents with  . Dizziness  . Nausea    HPI Briana Foster is a 71 y.o. female.  HPI  71 y.o female complaining of feeling weak and nauseated with vertigo that began today about 6 p.m.  Some worsening with head movment.  Improved now from when it began.  Decreased ability to walk gait difficulty.  Denies vision change, but symptoms wrse with eye oopening.  Denies ear problems, headache this am, No similar symptoms inpast    Past Medical History:  Diagnosis Date  . Arthritis 07-20-12   hands  . Depression   . GERD (gastroesophageal reflux disease)    "comes and goes" - no meds currently  . Heart murmur    told as teenager  . History of hypertension 07-20-12   pt stopped BP med several months ago because BP has been normal-MD is aware  . Hyperlipidemia   . Restless leg syndrome     There are no active problems to display for this patient.   Past Surgical History:  Procedure Laterality Date  . BUNIONECTOMY  20 yrs ago   bil feet  . BUNIONECTOMY  11/30/2011   Procedure: Arbutus Leas;  Surgeon: Drucilla Schmidt, MD;  Location: [facility];  Service: Orthopedics;  Laterality: Bilateral;  RIGHT FOOT EXCISION OF BUNIONETTE AND PARTIAL   PROXIMAL PHALANGECTOMY OF 5TH TOE LEFT FOOT FUNK BUNIONECTOMY,EXCISION OF BUNIONETTE   . COLONOSCOPY WITH PROPOFOL N/A 08/07/2012   Procedure: COLONOSCOPY WITH PROPOFOL;  Surgeon: Charolett Bumpers, MD;  Location: [facility];  Service: Endoscopy;  Laterality: N/A;  . MOUTH SURGERY     teeth extractions  . TONSILLECTOMY    . TUBAL LIGATION      OB History    No data available       Home Medications    Prior to Admission medications   Medication Sig Start Date End Date Taking? Authorizing Provider  acetaminophen (TYLENOL) 500 MG tablet Take 500 mg by mouth every 6 (six) hours as needed for  pain. Pain    Historical Provider, MD  amLODipine-benazepril (LOTREL) 5-10 MG per capsule Take 1 capsule by mouth daily.    Historical Provider, MD  diphenhydrAMINE (BENADRYL) 25 mg capsule Take 1 capsule (25 mg total) by mouth every 6 (six) hours as needed for itching. 08/05/14   Derwood Kaplan, MD  predniSONE (DELTASONE) 20 MG tablet Take 2 tablets (40 mg total) by mouth daily. 08/05/14   Derwood Kaplan, MD  rOPINIRole (REQUIP) 2 MG tablet Take 2 mg by mouth at bedtime.    Historical Provider, MD    Family History No family history on file.  Social History Social History  Substance Use Topics  . Smoking status: Never Smoker  . Smokeless tobacco: Never Used  . Alcohol use No     Allergies   Apricot flavor   Review of Systems Review of Systems  HENT: Negative.   Eyes: Negative.   Respiratory: Positive for shortness of breath.   Cardiovascular: Negative.   Gastrointestinal: Positive for nausea. Negative for vomiting.  Endocrine: Negative.   Genitourinary: Negative.   Musculoskeletal: Negative.   Skin: Negative.   Neurological: Positive for dizziness and weakness. Negative for speech difficulty and numbness.  Hematological: Negative.   Psychiatric/Behavioral: Negative.      Physical Exam Updated Vital Signs BP 153/82 (BP Location: Left  Arm)   Pulse 94   Resp 22   SpO2 93% Comment: Simultaneous filing. User may not have seen previous data.  Physical Exam  Constitutional: She is oriented to person, place, and time. She appears well-developed and well-nourished. No distress.  HENT:  Head: Normocephalic and atraumatic.  Right Ear: External ear normal.  Left Ear: External ear normal.  Nose: Nose normal.  Eyes: Conjunctivae and EOM are normal. Pupils are equal, round, and reactive to light.  Neck: Normal range of motion. Neck supple.  Pulmonary/Chest: Effort normal.  Musculoskeletal: Normal range of motion.  Neurological: She is alert and oriented to person, place, and  time. She displays normal reflexes. No cranial nerve deficit or sensory deficit. She exhibits normal muscle tone. Coordination normal.  Skin: Skin is warm and dry.  Psychiatric: She has a normal mood and affect. Her behavior is normal. Thought content normal.  Nursing note and vitals reviewed.    ED Treatments / Results  Labs (all labs ordered are listed, but only abnormal results are displayed) Labs Reviewed  BASIC METABOLIC PANEL  CBC  I-STAT TROPOININ, ED    EKG  EKG Interpretation  Date/Time:  Wednesday March 16 2016 20:28:02 EST Ventricular Rate:  99 PR Interval:    QRS Duration: 84 QT Interval:  353 QTC Calculation: 453 R Axis:   3 Text Interpretation:  Sinus tachycardia Ventricular premature complex No significant change since last tracing Confirmed by Kehinde Totzke MD, Duwayne Heck [phone]) on 03/16/2016 8:54:01 PM       Radiology Dg Chest 2 View  Result Date: 03/16/2016 CLINICAL DATA:  Acute onset of vertigo.  Initial encounter. EXAM: CHEST  2 VIEW COMPARISON:  Chest radiograph performed 08/05/2014 FINDINGS: The lungs are well-aerated. Mild peribronchial thickening is noted. There is no evidence of pleural effusion or pneumothorax. The heart is normal in size; the mediastinal contour is within normal limits. No acute osseous abnormalities are seen. IMPRESSION: Mild peribronchial thickening noted.  Lungs otherwise grossly clear. Electronically Signed   By: Roanna Raider M.D.   On: 03/16/2016 22:04   Ct Head Wo Contrast  Result Date: 03/16/2016 CLINICAL DATA:  Acute onset of dizziness and nausea. Initial encounter. EXAM: CT HEAD WITHOUT CONTRAST TECHNIQUE: Contiguous axial images were obtained from the base of the skull through the vertex without intravenous contrast. COMPARISON:  CT of the head performed 09/15/2015 FINDINGS: Brain: No evidence of acute infarction, hemorrhage, hydrocephalus, extra-axial collection or mass lesion/mass effect. Prominence of the ventricles and sulci  reflects mild cortical volume loss. Mild subcortical white matter change likely reflects small vessel ischemic microangiopathy. The brainstem and fourth ventricle are within normal limits. The basal ganglia are unremarkable in appearance. The cerebral hemispheres demonstrate grossly normal gray-white differentiation. No mass effect or midline shift is seen. Vascular: No hyperdense vessel or unexpected calcification. Skull: There is no evidence of fracture; visualized osseous structures are unremarkable in appearance. Sinuses/Orbits: The orbits are within normal limits. The paranasal sinuses and mastoid air cells are well-aerated. Other: No significant soft tissue abnormalities are seen. IMPRESSION: 1. No acute intracranial pathology seen on CT. 2. Mild cortical volume loss and scattered small vessel ischemic microangiopathy. Electronically Signed   By: Roanna Raider M.D.   On: 03/16/2016 22:03   Mr Brain Wo Contrast  Result Date: 03/16/2016 CLINICAL DATA:  Acute onset vertigo this evening. Difficulty walking. Similar symptoms a few weeks ago with spontaneous resolution. History of hypertension, hyperlipidemia. EXAM: MRI HEAD WITHOUT CONTRAST TECHNIQUE: Multiplanar, multiecho pulse sequences of the brain  and surrounding structures were obtained without intravenous contrast. COMPARISON:  CT HEAD March 16, 2016 at 2138 hours FINDINGS: Multiple sequences are moderately motion degraded. BRAIN: No reduced diffusion to suggest acute ischemia. No susceptibility artifact to suggest hemorrhage. The ventricles and sulci are normal for patient's age. No suspicious parenchymal signal, masses or mass effect. No abnormal extra-axial fluid collections. VASCULAR: Normal major intracranial vascular flow voids present at skull base. SKULL AND UPPER CERVICAL SPINE: No abnormal sellar expansion. No suspicious calvarial bone marrow signal. Craniocervical junction maintained. SINUSES/ORBITS: Trace mastoid effusions. Paranasal  sinuses are well-aerated. The included ocular globes and orbital contents are non-suspicious. OTHER: Patient is edentulous. IMPRESSION: Negative moderately motion degraded noncontrast MRI head for age. Electronically Signed   By: Awilda Metro M.D.   On: 03/16/2016 23:51    Procedures Procedures (including critical care time)  Medications Ordered in ED Medications - No data to display   Initial Impression / Assessment and Plan / ED Course  I have reviewed the triage vital signs and the nursing notes.  Pertinent labs & imaging results that were available during my care of the patient were reviewed by me and considered in my medical decision making (see chart for details).     This is a 71 year old female with onset of vertigo tonight. Initial evaluation showed no other focal neuro deficits. Neurology was consultative. MRI obtained shows no evidence of stroke. Patient ambulatory here in the emergency department. She is discharged home in improved condition.  Final Clinical Impressions(s) / ED Diagnoses   Final diagnoses:  Vertigo    New Prescriptions New Prescriptions   MECLIZINE (ANTIVERT) 25 MG TABLET    Take 1 tablet (25 mg total) by mouth 3 (three) times daily as needed for dizziness.     Margarita Grizzle, MD 03/16/16 [phone]

## 2016-03-16 NOTE — ED Triage Notes (Signed)
Per [facility]  Pt called out for Dizziness with nausea. Pt also called out for ache in the center of her chest 4/10 with no radiation. Hypertensive with EMS. She does have a history. Pt stopped taking her medication after reaching therapeutic level. Negative on stroke scale.  4mg  zofran 324 ASA and 2 NTG

## 2016-03-16 NOTE — ED Notes (Signed)
Patient transported to CT 

## 2016-03-17 DIAGNOSIS — R42 Dizziness and giddiness: Secondary | ICD-10-CM | POA: Diagnosis not present

## 2016-03-21 DIAGNOSIS — R42 Dizziness and giddiness: Secondary | ICD-10-CM | POA: Diagnosis not present

## 2016-03-21 DIAGNOSIS — H8110 Benign paroxysmal vertigo, unspecified ear: Secondary | ICD-10-CM | POA: Diagnosis not present

## 2016-04-27 ENCOUNTER — Other Ambulatory Visit: Payer: Self-pay | Admitting: Internal Medicine

## 2016-04-27 DIAGNOSIS — G2581 Restless legs syndrome: Secondary | ICD-10-CM | POA: Diagnosis not present

## 2016-04-27 DIAGNOSIS — F325 Major depressive disorder, single episode, in full remission: Secondary | ICD-10-CM | POA: Diagnosis not present

## 2016-04-27 DIAGNOSIS — R011 Cardiac murmur, unspecified: Secondary | ICD-10-CM | POA: Diagnosis not present

## 2016-04-27 DIAGNOSIS — E538 Deficiency of other specified B group vitamins: Secondary | ICD-10-CM | POA: Diagnosis not present

## 2016-04-27 DIAGNOSIS — J309 Allergic rhinitis, unspecified: Secondary | ICD-10-CM | POA: Diagnosis not present

## 2016-04-27 DIAGNOSIS — M519 Unspecified thoracic, thoracolumbar and lumbosacral intervertebral disc disorder: Secondary | ICD-10-CM | POA: Diagnosis not present

## 2016-04-27 DIAGNOSIS — I10 Essential (primary) hypertension: Secondary | ICD-10-CM | POA: Diagnosis not present

## 2016-04-27 DIAGNOSIS — R7303 Prediabetes: Secondary | ICD-10-CM | POA: Diagnosis not present

## 2016-04-27 DIAGNOSIS — M1990 Unspecified osteoarthritis, unspecified site: Secondary | ICD-10-CM | POA: Diagnosis not present

## 2016-04-27 DIAGNOSIS — E782 Mixed hyperlipidemia: Secondary | ICD-10-CM | POA: Diagnosis not present

## 2016-04-28 ENCOUNTER — Ambulatory Visit (HOSPITAL_COMMUNITY): Payer: Medicare Other | Attending: Cardiovascular Disease

## 2016-04-28 ENCOUNTER — Other Ambulatory Visit: Payer: Self-pay

## 2016-04-28 DIAGNOSIS — I119 Hypertensive heart disease without heart failure: Secondary | ICD-10-CM | POA: Insufficient documentation

## 2016-04-28 DIAGNOSIS — E785 Hyperlipidemia, unspecified: Secondary | ICD-10-CM | POA: Insufficient documentation

## 2016-04-28 DIAGNOSIS — I340 Nonrheumatic mitral (valve) insufficiency: Secondary | ICD-10-CM | POA: Diagnosis not present

## 2016-04-28 DIAGNOSIS — R011 Cardiac murmur, unspecified: Secondary | ICD-10-CM | POA: Insufficient documentation

## 2016-04-28 DIAGNOSIS — I350 Nonrheumatic aortic (valve) stenosis: Secondary | ICD-10-CM | POA: Insufficient documentation

## 2016-06-05 DIAGNOSIS — M25521 Pain in right elbow: Secondary | ICD-10-CM | POA: Diagnosis not present

## 2016-12-07 ENCOUNTER — Other Ambulatory Visit: Payer: Self-pay | Admitting: Internal Medicine

## 2016-12-07 ENCOUNTER — Ambulatory Visit
Admission: RE | Admit: 2016-12-07 | Discharge: 2016-12-07 | Disposition: A | Discharge status: Home / Self Care | Payer: Medicare Other | Source: Ambulatory Visit | Attending: Internal Medicine | Admitting: Internal Medicine

## 2016-12-07 DIAGNOSIS — E782 Mixed hyperlipidemia: Secondary | ICD-10-CM | POA: Diagnosis not present

## 2016-12-07 DIAGNOSIS — R05 Cough: Secondary | ICD-10-CM | POA: Diagnosis not present

## 2016-12-07 DIAGNOSIS — I10 Essential (primary) hypertension: Secondary | ICD-10-CM | POA: Diagnosis not present

## 2016-12-07 DIAGNOSIS — J9801 Acute bronchospasm: Secondary | ICD-10-CM

## 2016-12-07 DIAGNOSIS — R5383 Other fatigue: Secondary | ICD-10-CM | POA: Diagnosis not present

## 2016-12-07 DIAGNOSIS — R739 Hyperglycemia, unspecified: Secondary | ICD-10-CM | POA: Diagnosis not present

## 2016-12-07 DIAGNOSIS — J45909 Unspecified asthma, uncomplicated: Secondary | ICD-10-CM | POA: Diagnosis not present

## 2016-12-26 DIAGNOSIS — Z1389 Encounter for screening for other disorder: Secondary | ICD-10-CM | POA: Diagnosis not present

## 2016-12-26 DIAGNOSIS — E538 Deficiency of other specified B group vitamins: Secondary | ICD-10-CM | POA: Diagnosis not present

## 2016-12-26 DIAGNOSIS — I350 Nonrheumatic aortic (valve) stenosis: Secondary | ICD-10-CM | POA: Diagnosis not present

## 2016-12-26 DIAGNOSIS — F325 Major depressive disorder, single episode, in full remission: Secondary | ICD-10-CM | POA: Diagnosis not present

## 2016-12-26 DIAGNOSIS — J309 Allergic rhinitis, unspecified: Secondary | ICD-10-CM | POA: Diagnosis not present

## 2016-12-26 DIAGNOSIS — E782 Mixed hyperlipidemia: Secondary | ICD-10-CM | POA: Diagnosis not present

## 2016-12-26 DIAGNOSIS — M519 Unspecified thoracic, thoracolumbar and lumbosacral intervertebral disc disorder: Secondary | ICD-10-CM | POA: Diagnosis not present

## 2016-12-26 DIAGNOSIS — Z0000 Encounter for general adult medical examination without abnormal findings: Secondary | ICD-10-CM | POA: Diagnosis not present

## 2016-12-26 DIAGNOSIS — I10 Essential (primary) hypertension: Secondary | ICD-10-CM | POA: Diagnosis not present

## 2016-12-26 DIAGNOSIS — M1990 Unspecified osteoarthritis, unspecified site: Secondary | ICD-10-CM | POA: Diagnosis not present

## 2016-12-26 DIAGNOSIS — Z23 Encounter for immunization: Secondary | ICD-10-CM | POA: Diagnosis not present

## 2016-12-26 DIAGNOSIS — G2581 Restless legs syndrome: Secondary | ICD-10-CM | POA: Diagnosis not present

## 2016-12-29 DIAGNOSIS — M1812 Unilateral primary osteoarthritis of first carpometacarpal joint, left hand: Secondary | ICD-10-CM | POA: Diagnosis not present

## 2016-12-29 DIAGNOSIS — M19032 Primary osteoarthritis, left wrist: Secondary | ICD-10-CM | POA: Diagnosis not present

## 2017-02-09 DIAGNOSIS — M79641 Pain in right hand: Secondary | ICD-10-CM | POA: Diagnosis not present

## 2017-02-09 DIAGNOSIS — M150 Primary generalized (osteo)arthritis: Secondary | ICD-10-CM | POA: Diagnosis not present

## 2017-02-09 DIAGNOSIS — M2550 Pain in unspecified joint: Secondary | ICD-10-CM | POA: Diagnosis not present

## 2017-02-09 DIAGNOSIS — M79642 Pain in left hand: Secondary | ICD-10-CM | POA: Diagnosis not present

## 2017-02-09 DIAGNOSIS — Z6830 Body mass index (BMI) 30.0-30.9, adult: Secondary | ICD-10-CM | POA: Diagnosis not present

## 2017-02-09 DIAGNOSIS — R202 Paresthesia of skin: Secondary | ICD-10-CM | POA: Diagnosis not present

## 2017-02-09 DIAGNOSIS — E669 Obesity, unspecified: Secondary | ICD-10-CM | POA: Diagnosis not present

## 2017-02-16 DIAGNOSIS — M79642 Pain in left hand: Secondary | ICD-10-CM | POA: Diagnosis not present

## 2017-02-16 DIAGNOSIS — G5621 Lesion of ulnar nerve, right upper limb: Secondary | ICD-10-CM | POA: Diagnosis not present

## 2017-02-16 DIAGNOSIS — M79641 Pain in right hand: Secondary | ICD-10-CM | POA: Diagnosis not present

## 2017-02-16 DIAGNOSIS — M19039 Primary osteoarthritis, unspecified wrist: Secondary | ICD-10-CM | POA: Diagnosis not present

## 2017-02-16 DIAGNOSIS — M19032 Primary osteoarthritis, left wrist: Secondary | ICD-10-CM | POA: Diagnosis not present

## 2017-02-20 DIAGNOSIS — J45909 Unspecified asthma, uncomplicated: Secondary | ICD-10-CM | POA: Diagnosis not present

## 2017-02-27 DIAGNOSIS — G5621 Lesion of ulnar nerve, right upper limb: Secondary | ICD-10-CM | POA: Diagnosis not present

## 2017-02-27 DIAGNOSIS — G5601 Carpal tunnel syndrome, right upper limb: Secondary | ICD-10-CM | POA: Diagnosis not present

## 2017-02-28 DIAGNOSIS — R7301 Impaired fasting glucose: Secondary | ICD-10-CM | POA: Diagnosis not present

## 2017-02-28 DIAGNOSIS — E782 Mixed hyperlipidemia: Secondary | ICD-10-CM | POA: Diagnosis not present

## 2017-03-06 DIAGNOSIS — M13832 Other specified arthritis, left wrist: Secondary | ICD-10-CM | POA: Diagnosis not present

## 2017-03-06 DIAGNOSIS — G5621 Lesion of ulnar nerve, right upper limb: Secondary | ICD-10-CM | POA: Diagnosis not present

## 2017-04-11 DIAGNOSIS — M65831 Other synovitis and tenosynovitis, right forearm: Secondary | ICD-10-CM | POA: Diagnosis not present

## 2017-04-11 DIAGNOSIS — G5601 Carpal tunnel syndrome, right upper limb: Secondary | ICD-10-CM | POA: Diagnosis not present

## 2017-04-11 DIAGNOSIS — G5621 Lesion of ulnar nerve, right upper limb: Secondary | ICD-10-CM | POA: Diagnosis not present

## 2017-04-14 DIAGNOSIS — G5621 Lesion of ulnar nerve, right upper limb: Secondary | ICD-10-CM | POA: Diagnosis not present

## 2017-04-14 DIAGNOSIS — Z4889 Encounter for other specified surgical aftercare: Secondary | ICD-10-CM | POA: Diagnosis not present

## 2017-04-14 DIAGNOSIS — G5601 Carpal tunnel syndrome, right upper limb: Secondary | ICD-10-CM | POA: Diagnosis not present

## 2017-05-08 DIAGNOSIS — G5621 Lesion of ulnar nerve, right upper limb: Secondary | ICD-10-CM | POA: Diagnosis not present

## 2017-06-22 ENCOUNTER — Other Ambulatory Visit: Payer: Self-pay | Admitting: Internal Medicine

## 2017-06-22 DIAGNOSIS — R1310 Dysphagia, unspecified: Secondary | ICD-10-CM

## 2017-06-22 DIAGNOSIS — E782 Mixed hyperlipidemia: Secondary | ICD-10-CM | POA: Diagnosis not present

## 2017-06-22 DIAGNOSIS — G2581 Restless legs syndrome: Secondary | ICD-10-CM | POA: Diagnosis not present

## 2017-06-22 DIAGNOSIS — F325 Major depressive disorder, single episode, in full remission: Secondary | ICD-10-CM | POA: Diagnosis not present

## 2017-06-22 DIAGNOSIS — K219 Gastro-esophageal reflux disease without esophagitis: Secondary | ICD-10-CM | POA: Diagnosis not present

## 2017-06-22 DIAGNOSIS — I10 Essential (primary) hypertension: Secondary | ICD-10-CM | POA: Diagnosis not present

## 2017-06-22 DIAGNOSIS — R110 Nausea: Secondary | ICD-10-CM

## 2017-06-22 DIAGNOSIS — R7303 Prediabetes: Secondary | ICD-10-CM | POA: Diagnosis not present

## 2017-06-27 ENCOUNTER — Other Ambulatory Visit: Payer: Medicare Other

## 2017-06-28 ENCOUNTER — Ambulatory Visit
Admission: RE | Admit: 2017-06-28 | Discharge: 2017-06-28 | Disposition: A | Discharge status: Home / Self Care | Payer: Medicare Other | Source: Ambulatory Visit | Attending: Internal Medicine | Admitting: Internal Medicine

## 2017-06-28 DIAGNOSIS — R110 Nausea: Secondary | ICD-10-CM

## 2017-06-28 DIAGNOSIS — R1310 Dysphagia, unspecified: Secondary | ICD-10-CM

## 2017-07-27 DIAGNOSIS — M26629 Arthralgia of temporomandibular joint, unspecified side: Secondary | ICD-10-CM | POA: Diagnosis not present

## 2017-08-10 DIAGNOSIS — M13832 Other specified arthritis, left wrist: Secondary | ICD-10-CM | POA: Diagnosis not present

## 2017-08-10 DIAGNOSIS — M79645 Pain in left finger(s): Secondary | ICD-10-CM | POA: Diagnosis not present

## 2017-08-10 DIAGNOSIS — G5601 Carpal tunnel syndrome, right upper limb: Secondary | ICD-10-CM | POA: Diagnosis not present

## 2017-08-10 DIAGNOSIS — G5621 Lesion of ulnar nerve, right upper limb: Secondary | ICD-10-CM | POA: Diagnosis not present

## 2017-09-12 DIAGNOSIS — M13832 Other specified arthritis, left wrist: Secondary | ICD-10-CM | POA: Diagnosis not present

## 2017-09-12 DIAGNOSIS — M13132 Monoarthritis, not elsewhere classified, left wrist: Secondary | ICD-10-CM | POA: Diagnosis not present

## 2017-09-12 DIAGNOSIS — G8918 Other acute postprocedural pain: Secondary | ICD-10-CM | POA: Diagnosis not present

## 2017-09-27 DIAGNOSIS — M13132 Monoarthritis, not elsewhere classified, left wrist: Secondary | ICD-10-CM | POA: Diagnosis not present

## 2017-09-27 DIAGNOSIS — Z4789 Encounter for other orthopedic aftercare: Secondary | ICD-10-CM | POA: Diagnosis not present

## 2017-09-27 DIAGNOSIS — M25532 Pain in left wrist: Secondary | ICD-10-CM | POA: Diagnosis not present

## 2017-10-11 DIAGNOSIS — Z4789 Encounter for other orthopedic aftercare: Secondary | ICD-10-CM | POA: Diagnosis not present

## 2017-10-11 DIAGNOSIS — M25532 Pain in left wrist: Secondary | ICD-10-CM | POA: Diagnosis not present

## 2017-10-11 DIAGNOSIS — M13132 Monoarthritis, not elsewhere classified, left wrist: Secondary | ICD-10-CM | POA: Diagnosis not present

## 2017-10-26 DIAGNOSIS — M25632 Stiffness of left wrist, not elsewhere classified: Secondary | ICD-10-CM | POA: Diagnosis not present

## 2017-11-08 DIAGNOSIS — M25532 Pain in left wrist: Secondary | ICD-10-CM | POA: Diagnosis not present

## 2017-11-08 DIAGNOSIS — Z4789 Encounter for other orthopedic aftercare: Secondary | ICD-10-CM | POA: Diagnosis not present

## 2017-11-10 DIAGNOSIS — M25632 Stiffness of left wrist, not elsewhere classified: Secondary | ICD-10-CM | POA: Diagnosis not present

## 2017-11-28 DIAGNOSIS — M25632 Stiffness of left wrist, not elsewhere classified: Secondary | ICD-10-CM | POA: Diagnosis not present

## 2017-12-07 DIAGNOSIS — Z4789 Encounter for other orthopedic aftercare: Secondary | ICD-10-CM | POA: Diagnosis not present

## 2018-01-04 DIAGNOSIS — M25532 Pain in left wrist: Secondary | ICD-10-CM | POA: Diagnosis not present

## 2018-01-14 ENCOUNTER — Other Ambulatory Visit: Payer: Self-pay

## 2018-01-14 ENCOUNTER — Encounter (HOSPITAL_COMMUNITY): Payer: Self-pay

## 2018-01-14 ENCOUNTER — Ambulatory Visit (HOSPITAL_COMMUNITY)
Admission: EM | Admit: 2018-01-14 | Discharge: 2018-01-14 | Disposition: A | Discharge status: Home / Self Care | Payer: Medicare Other | Attending: Family | Admitting: Family

## 2018-01-14 DIAGNOSIS — R059 Cough, unspecified: Secondary | ICD-10-CM

## 2018-01-14 DIAGNOSIS — R0602 Shortness of breath: Secondary | ICD-10-CM | POA: Diagnosis not present

## 2018-01-14 DIAGNOSIS — R062 Wheezing: Secondary | ICD-10-CM | POA: Insufficient documentation

## 2018-01-14 DIAGNOSIS — B001 Herpesviral vesicular dermatitis: Secondary | ICD-10-CM | POA: Diagnosis not present

## 2018-01-14 DIAGNOSIS — J0100 Acute maxillary sinusitis, unspecified: Secondary | ICD-10-CM | POA: Insufficient documentation

## 2018-01-14 DIAGNOSIS — R05 Cough: Secondary | ICD-10-CM | POA: Insufficient documentation

## 2018-01-14 MED ORDER — IPRATROPIUM-ALBUTEROL 0.5-2.5 (3) MG/3ML IN SOLN
RESPIRATORY_TRACT | Status: AC
  Filled 2018-01-14: qty 3

## 2018-01-14 MED ORDER — DOXYCYCLINE HYCLATE 100 MG PO CAPS: 100.0000 mg | ORAL_CAPSULE | Freq: Two times a day (BID) | ORAL | 0 refills | Status: AC

## 2018-01-14 MED ORDER — METHYLPREDNISOLONE SODIUM SUCC 125 MG IJ SOLR
INTRAMUSCULAR | Status: AC
  Filled 2018-01-14: qty 2

## 2018-01-14 MED ORDER — VALACYCLOVIR HCL 1 G PO TABS: 2000.0000 mg | ORAL_TABLET | Freq: Two times a day (BID) | ORAL | 0 refills | Status: AC

## 2018-01-14 MED ORDER — METHYLPREDNISOLONE SODIUM SUCC 125 MG IJ SOLR
125.0000 mg | Freq: Once | INTRAMUSCULAR | Status: AC
  Administered 2018-01-14: 125 mg via INTRAMUSCULAR

## 2018-01-14 MED ORDER — IPRATROPIUM-ALBUTEROL 0.5-2.5 (3) MG/3ML IN SOLN
3.0000 mL | Freq: Once | RESPIRATORY_TRACT | Status: AC
  Administered 2018-01-14: 3 mL via RESPIRATORY_TRACT

## 2018-01-14 MED ORDER — PREDNISONE 20 MG PO TABS: 40.0000 mg | ORAL_TABLET | Freq: Every day | ORAL | 0 refills | Status: AC

## 2018-01-14 MED ORDER — GUAIFENESIN-CODEINE 100-10 MG/5ML PO SOLN: 10.0000 mL | Freq: Four times a day (QID) | ORAL | 0 refills | Status: DC | PRN

## 2018-01-14 MED ORDER — ALBUTEROL SULFATE HFA 108 (90 BASE) MCG/ACT IN AERS: 2.0000 | INHALATION_SPRAY | Freq: Four times a day (QID) | RESPIRATORY_TRACT | 0 refills | Status: DC | PRN

## 2018-01-14 NOTE — ED Triage Notes (Signed)
Pt presents today with productive cough, wheezing, SOB that stared on Thursday. Has tried OTC mucinex and alka-seltzer with not much relief. States she has not been able to sleep due to sxs. Does have history of asthma but does not have an inhaler.

## 2018-01-14 NOTE — ED Provider Notes (Signed)
[facility]    CSN: [id number] Arrival date & time: 01/14/18  1002     History   Chief Complaint Chief Complaint  Patient presents with  . Cough    HPI Briana Foster is a 72 y.o. female.   72 year old female presents with cough, chest congestion and wheezing for the past 3 to 4 days that is getting worse. Had low grade fever 2 days ago but resolved now. Also having nasal congestion with dark yellow mucus and sore throat. Denies any vomiting or diarrhea. Has taken OTC Mucinex and Alka Seltzer plus with minimal relief. Unable to sleep due to cough. Also has a "fever blister" that appeared this morning. Has tried Abreva in the past with minimal success. Requests oral medication for treatment. Has history of asthma but was unable to afford last inhaler prescribed. Also has history of HTN and Restless leg syndrome and takes Lotrel and Requip daily.   The history is provided by the patient.    Past Medical History:  Diagnosis Date  . Arthritis 07-20-12   hands  . Depression   . GERD (gastroesophageal reflux disease)    "comes and goes" - no meds currently  . Heart murmur    told as teenager  . History of hypertension 07-20-12   pt stopped BP med several months ago because BP has been normal-MD is aware  . Hyperlipidemia   . Restless leg syndrome     There are no active problems to display for this patient.   Past Surgical History:  Procedure Laterality Date  . BUNIONECTOMY  20 yrs ago   bil feet  . BUNIONECTOMY  11/30/2011   Procedure: Arbutus Leas;  Surgeon: Drucilla Schmidt, MD;  Location: [facility];  Service: Orthopedics;  Laterality: Bilateral;  RIGHT FOOT EXCISION OF BUNIONETTE AND PARTIAL   PROXIMAL PHALANGECTOMY OF 5TH TOE LEFT FOOT FUNK BUNIONECTOMY,EXCISION OF BUNIONETTE   . COLONOSCOPY WITH PROPOFOL N/A 08/07/2012   Procedure: COLONOSCOPY WITH PROPOFOL;  Surgeon: Charolett Bumpers, MD;  Location: [facility];  Service: Endoscopy;  Laterality: N/A;  .  MOUTH SURGERY     teeth extractions  . TONSILLECTOMY    . TUBAL LIGATION      OB History   No obstetric history on file.      Home Medications    Prior to Admission medications   Medication Sig Start Date End Date Taking? Authorizing Provider  amLODipine-benazepril (LOTREL) 10-20 MG capsule Take 1 capsule by mouth daily.   Yes [provider]  rOPINIRole (REQUIP) 3 MG tablet Take 3 mg by mouth at bedtime.   Yes [provider]  albuterol (PROVENTIL HFA;VENTOLIN HFA) 108 (90 Base) MCG/ACT inhaler Inhale 2 puffs into the lungs every 6 (six) hours as needed for wheezing or shortness of breath. 01/14/18   Sudie Grumbling, NP  doxycycline (VIBRAMYCIN) 100 MG capsule Take 1 capsule (100 mg total) by mouth 2 (two) times daily for 7 days. 01/14/18 01/21/18  Sudie Grumbling, NP  guaiFENesin-codeine 100-10 MG/5ML syrup Take 10 mLs by mouth every 6 (six) hours as needed for cough. 01/14/18   Sudie Grumbling, NP  predniSONE (DELTASONE) 20 MG tablet Take 2 tablets (40 mg total) by mouth daily for 5 days. 01/14/18 01/19/18  Sudie Grumbling, NP  valACYclovir (VALTREX) 1000 MG tablet Take 2 tablets (2,000 mg total) by mouth 2 (two) times daily for 1 day. 01/14/18 01/15/18  Sudie Grumbling, NP    Family History  No family history on file.  Social History Social History   Tobacco Use  . Smoking status: Never Smoker  . Smokeless tobacco: Never Used  Substance Use Topics  . Alcohol use: No  . Drug use: No     Allergies   Apricot flavor and Bee venom   Review of Systems Review of Systems  Constitutional: Positive for appetite change, fatigue and fever. Negative for activity change and chills.  HENT: Positive for congestion, mouth sores, postnasal drip, sinus pressure and sore throat. Negative for ear discharge, ear pain, facial swelling, nosebleeds, rhinorrhea, sinus pain, sneezing and trouble swallowing.   Eyes: Negative for pain, discharge, redness and itching.    Respiratory: Positive for cough, shortness of breath and wheezing. Negative for chest tightness and stridor.   Cardiovascular: Negative for chest pain and palpitations.  Gastrointestinal: Positive for nausea. Negative for abdominal pain, diarrhea and vomiting.  Musculoskeletal: Positive for arthralgias. Negative for back pain, myalgias, neck pain and neck stiffness.  Skin: Negative for color change and wound.  Allergic/Immunologic: Positive for food allergies.  Neurological: Positive for headaches. Negative for dizziness, tremors, seizures, syncope, weakness, light-headedness and numbness.  Hematological: Negative for adenopathy. Does not bruise/bleed easily.     Physical Exam Triage Vital Signs ED Triage Vitals  Enc Vitals Group     BP 01/14/18 1013 (!) 169/86     Pulse Rate 01/14/18 1013 77     Resp 01/14/18 1013 18     Temp 01/14/18 1013 98.4 F (36.9 C)     Temp Source 01/14/18 1013 Oral     SpO2 01/14/18 1013 95 %     Weight --      Height --      Head Circumference --      Peak Flow --      Pain Score 01/14/18 1015 8     Pain Loc --      Pain Edu? --      Excl. in GC? --    No data found.  Updated Vital Signs BP (!) 169/86 (BP Location: Left Arm)   Pulse 77   Temp 98.4 F (36.9 C) (Oral)   Resp 18   SpO2 95%   Visual Acuity Right Eye Distance:   Left Eye Distance:   Bilateral Distance:    Right Eye Near:   Left Eye Near:    Bilateral Near:     Physical Exam Vitals signs and nursing note reviewed.  Constitutional:      General: She is awake. She is not in acute distress.    Appearance: Normal appearance. She is well-developed and overweight. She is ill-appearing. She is not toxic-appearing.     Comments: Patient sitting comfortably on exam table in no acute distress but coughs frequently and audible wheezes heard.   HENT:     Head: Normocephalic and atraumatic.     Right Ear: Hearing, tympanic membrane, ear canal and external ear normal.     Left Ear:  Hearing, tympanic membrane, ear canal and external ear normal.     Nose: Congestion and rhinorrhea present. No nasal tenderness.     Right Turbinates: Swollen.     Left Turbinates: Swollen.     Right Sinus: Maxillary sinus tenderness present. No frontal sinus tenderness.     Left Sinus: Maxillary sinus tenderness present. No frontal sinus tenderness.     Mouth/Throat:     Lips: Pink. Lesions (one lesion on left upper lip just lateral to midline) present.  Mouth: Mucous membranes are moist. No oral lesions (no internal lesions).     Pharynx: Uvula midline. Posterior oropharyngeal erythema present. No pharyngeal swelling or oropharyngeal exudate.   Eyes:     Extraocular Movements: Extraocular movements intact.     Conjunctiva/sclera: Conjunctivae normal.  Neck:     Musculoskeletal: Normal range of motion and neck supple. No muscular tenderness.  Cardiovascular:     Rate and Rhythm: Normal rate and regular rhythm.     Heart sounds: Normal heart sounds.  Pulmonary:     Effort: Pulmonary effort is normal. No respiratory distress or retractions.     Breath sounds: Decreased air movement present. No stridor. Examination of the right-upper field reveals decreased breath sounds, wheezing and rhonchi. Examination of the left-upper field reveals decreased breath sounds, wheezing and rhonchi. Examination of the right-middle field reveals wheezing. Examination of the right-lower field reveals decreased breath sounds and wheezing. Examination of the left-lower field reveals decreased breath sounds and wheezing. Decreased breath sounds, wheezing and rhonchi present. No rales.  Lymphadenopathy:     Cervical: No cervical adenopathy.  Skin:    General: Skin is warm and dry.     Capillary Refill: Capillary refill takes less than 2 seconds.  Neurological:     General: No focal deficit present.     Mental Status: She is alert and oriented to person, place, and time.  Psychiatric:        Mood and Affect:  Mood normal.        Behavior: Behavior normal. Behavior is cooperative.        Thought Content: Thought content normal.        Judgment: Judgment normal.      UC Treatments / Results  Labs (all labs ordered are listed, but only abnormal results are displayed) Labs Reviewed - No data to display  EKG None  Radiology No results found.  Procedures Procedures (including critical care time)  Medications Ordered in UC Medications  ipratropium-albuterol (DUONEB) 0.5-2.5 (3) MG/3ML nebulizer solution 3 mL (3 mLs Nebulization Given 01/14/18 1033)  methylPREDNISolone sodium succinate (SOLU-MEDROL) 125 mg/2 mL injection 125 mg (125 mg Intramuscular Given 01/14/18 1113)    Initial Impression / Assessment and Plan / UC Course  I have reviewed the triage vital signs and the nursing notes.  Pertinent labs & imaging results that were available during my care of the patient were reviewed by me and considered in my medical decision making (see chart for details).    Discussed with patient that she probably has a viral illness and worsening of asthma symptoms. Gave DuoNeb treatment today- patient indicated minimal help but slightly less wheezes and less tightness in chest upon ausculation after treatment. Gave SoluMedrol 125mg  IM now to help with breathing and wheezing. Recommend start oral Prednisone 40mg  daily for 5 days. Continue to monitor blood pressure since Prednisone can increase BP. Recommend restart Albuterol 2 puffs every 6 hours as needed for wheezing and cough. May take Robitussin AC 2 teaspoons every 6 hours as needed for cough. Since symptoms are worsening and concern for early sinusitis- will start Doxycycline 100mg  twice a day as directed. Also recommend Valtrex 2 g twice a day for 1 day for treatment of cold sore. Continue to push fluids to help loosen up mucus in chest. Follow-up with her PCP in 2 to 3 day if not improving or go to the ER if wheezing and shortness of breath worsen.    Final Clinical Impressions(s) / UC Diagnoses  Final diagnoses:  Cough  Shortness of breath  Wheezing  Acute non-recurrent maxillary sinusitis  Herpes labialis     Discharge Instructions     You were given a breathing treatment and a shot of Prednisone (steroid) today to help with your breathing and wheezing. Recommend start Doxycycline 100mg  twice a day as directed. Take Prednisone 40mg  daily for the next 5 days. Use Albuterol inhaler 2 puffs every 6 hours as needed for wheezing and cough. May take Robitussion cough medication with codeine 2 teaspoons every 6 hours as needed for cough. Also may use Valtrex 2 tablets twice a day for 1 day to help with your cold sore. Continue to increase fluids to help loosen up mucus. Follow-up with your PCP in 2 to 3 days if not improving or go to the ER if wheezing and shortness of breath worsen.     ED Prescriptions    Medication Sig Dispense Auth. Provider   doxycycline (VIBRAMYCIN) 100 MG capsule Take 1 capsule (100 mg total) by mouth 2 (two) times daily for 7 days. 14 capsule Sudie Grumbling, NP   predniSONE (DELTASONE) 20 MG tablet Take 2 tablets (40 mg total) by mouth daily for 5 days. 10 tablet Sudie Grumbling, NP   albuterol (PROVENTIL HFA;VENTOLIN HFA) 108 (90 Base) MCG/ACT inhaler Inhale 2 puffs into the lungs every 6 (six) hours as needed for wheezing or shortness of breath. 1 Inhaler Jae Bruck, Ali Lowe, NP   valACYclovir (VALTREX) 1000 MG tablet Take 2 tablets (2,000 mg total) by mouth 2 (two) times daily for 1 day. 4 tablet Sudie Grumbling, NP   guaiFENesin-codeine 100-10 MG/5ML syrup Take 10 mLs by mouth every 6 (six) hours as needed for cough. 118 mL Sudie Grumbling, NP     Controlled Substance Prescriptions [place] Controlled Substance Registry consulted? Yes. I have consulted the [place] Registry and feel the benefits outweigh the risks for a controlled substance at this time. No previous active Rx.    Sudie Grumbling, NP 01/15/18  [phone]

## 2018-01-14 NOTE — Discharge Instructions (Addendum)
You were given a breathing treatment and a shot of Prednisone (steroid) today to help with your breathing and wheezing. Recommend start Doxycycline 100mg  twice a day as directed. Take Prednisone 40mg  daily for the next 5 days. Use Albuterol inhaler 2 puffs every 6 hours as needed for wheezing and cough. May take Robitussion cough medication with codeine 2 teaspoons every 6 hours as needed for cough. Also may use Valtrex 2 tablets twice a day for 1 day to help with your cold sore. Continue to increase fluids to help loosen up mucus. Follow-up with your PCP in 2 to 3 days if not improving or go to the ER if wheezing and shortness of breath worsen.

## 2018-03-01 DIAGNOSIS — J45909 Unspecified asthma, uncomplicated: Secondary | ICD-10-CM | POA: Diagnosis not present

## 2018-03-01 DIAGNOSIS — Z9114 Patient's other noncompliance with medication regimen: Secondary | ICD-10-CM | POA: Diagnosis not present

## 2018-03-01 DIAGNOSIS — I10 Essential (primary) hypertension: Secondary | ICD-10-CM | POA: Diagnosis not present

## 2018-03-05 DIAGNOSIS — E782 Mixed hyperlipidemia: Secondary | ICD-10-CM | POA: Diagnosis not present

## 2018-03-05 DIAGNOSIS — F329 Major depressive disorder, single episode, unspecified: Secondary | ICD-10-CM | POA: Diagnosis not present

## 2018-03-05 DIAGNOSIS — Z23 Encounter for immunization: Secondary | ICD-10-CM | POA: Diagnosis not present

## 2018-03-05 DIAGNOSIS — J45909 Unspecified asthma, uncomplicated: Secondary | ICD-10-CM | POA: Diagnosis not present

## 2018-03-05 DIAGNOSIS — F419 Anxiety disorder, unspecified: Secondary | ICD-10-CM | POA: Diagnosis not present

## 2018-03-05 DIAGNOSIS — E669 Obesity, unspecified: Secondary | ICD-10-CM | POA: Diagnosis not present

## 2018-03-05 DIAGNOSIS — Z6832 Body mass index (BMI) 32.0-32.9, adult: Secondary | ICD-10-CM | POA: Diagnosis not present

## 2018-03-05 DIAGNOSIS — R7303 Prediabetes: Secondary | ICD-10-CM | POA: Diagnosis not present

## 2018-03-05 DIAGNOSIS — G2581 Restless legs syndrome: Secondary | ICD-10-CM | POA: Diagnosis not present

## 2018-03-05 DIAGNOSIS — I10 Essential (primary) hypertension: Secondary | ICD-10-CM | POA: Diagnosis not present

## 2018-03-08 DIAGNOSIS — M79645 Pain in left finger(s): Secondary | ICD-10-CM | POA: Diagnosis not present

## 2018-03-08 DIAGNOSIS — M25532 Pain in left wrist: Secondary | ICD-10-CM | POA: Diagnosis not present

## 2018-03-08 DIAGNOSIS — M13132 Monoarthritis, not elsewhere classified, left wrist: Secondary | ICD-10-CM | POA: Diagnosis not present

## 2018-03-13 ENCOUNTER — Encounter (INDEPENDENT_AMBULATORY_CARE_PROVIDER_SITE_OTHER): Payer: Self-pay

## 2018-03-22 ENCOUNTER — Ambulatory Visit (INDEPENDENT_AMBULATORY_CARE_PROVIDER_SITE_OTHER): Payer: Medicare Other | Admitting: Bariatrics

## 2018-03-22 ENCOUNTER — Encounter (INDEPENDENT_AMBULATORY_CARE_PROVIDER_SITE_OTHER): Payer: Self-pay | Admitting: Bariatrics

## 2018-03-22 VITALS — BP 168/98 | HR 86 | Temp 97.9°F | Ht 67.0 in | Wt 202.0 lb

## 2018-03-22 DIAGNOSIS — I10 Essential (primary) hypertension: Secondary | ICD-10-CM

## 2018-03-22 DIAGNOSIS — Z1331 Encounter for screening for depression: Secondary | ICD-10-CM

## 2018-03-22 DIAGNOSIS — Z9189 Other specified personal risk factors, not elsewhere classified: Secondary | ICD-10-CM

## 2018-03-22 DIAGNOSIS — E669 Obesity, unspecified: Secondary | ICD-10-CM | POA: Diagnosis not present

## 2018-03-22 DIAGNOSIS — R7309 Other abnormal glucose: Secondary | ICD-10-CM

## 2018-03-22 DIAGNOSIS — R0602 Shortness of breath: Secondary | ICD-10-CM | POA: Diagnosis not present

## 2018-03-22 DIAGNOSIS — R5383 Other fatigue: Secondary | ICD-10-CM | POA: Diagnosis not present

## 2018-03-22 DIAGNOSIS — E559 Vitamin D deficiency, unspecified: Secondary | ICD-10-CM

## 2018-03-22 DIAGNOSIS — Z6831 Body mass index (BMI) 31.0-31.9, adult: Secondary | ICD-10-CM | POA: Diagnosis not present

## 2018-03-22 DIAGNOSIS — Z0289 Encounter for other administrative examinations: Secondary | ICD-10-CM

## 2018-03-22 NOTE — Progress Notes (Signed)
.  Office: [phone]  /  Fax: [phone]   HPI:   Chief Complaint: OBESITY  Briana ShockDelores B Foster (MR# [id number]) is a 73 y.o. female who presents on 03/22/2018 for obesity evaluation and treatment. Current BMI is Body mass index is 31.64 kg/m.Marland Kitchen. Briana Foster has struggled with obesity for years and has been unsuccessful in either losing weight or maintaining long term weight loss. Briana Foster attended our information session and states she is currently in the action stage of change and ready to dedicate time achieving and maintaining a healthier weight.  Briana Foster states her desired weight loss is 42 lbs she started gaining weight about 10 yrs ago her heaviest weight ever was 203 lbs. She states that she has seafood allergies She likes to cook she sometimes wakes up in the middle of the night hungry she skips lunch she is frequently drinking liquids with calories she has problems with excessive hunger  she has binge eating behaviors   Briana Foster feels her energy is lower than it should be. This has worsened with weight gain and has not worsened recently. Briana Foster admits to daytime somnolence and she admits to waking up still tired. She is at risk for obstructive sleep apnea. Patent has a history of symptoms of daytime Briana, morning Briana and hypertension. Patient generally gets 4 to 6 hours of sleep per night, and states they generally have  restful sleep. Snoring is present. Apneic episodes are not present. Epworth Sleepiness Score is 4  Dyspnea on exertion Briana Foster notes increasing shortness of breath with exercising and seems to be worsening over time with weight gain. She notes getting out of breath sooner with activity than she used to. This has not gotten worse recently. Briana Foster has a history of asthma and she uses a rescue inhaler once a month. [name] denies orthopnea.  Hypertension Briana Foster is a 73 y.o. female with hypertension. She is taking Amlodipine. Briana Foster  denies lightheadedness. She is working weight loss to help control her blood pressure with the goal of decreasing her risk of heart attack and stroke. Briana Foster blood pressure is not well controlled.  Heart Murmur Briana Foster has a diagnosis of heart murmur from childhood and is considered benign. There are no complications. She has no heart pain and her PCP is aware. Briana Foster denies chest pain or heart pain.  At risk for cardiovascular disease Briana Foster is at a higher than average risk for cardiovascular disease due to obesity and hypertension. She currently denies any chest pain.  Vitamin D deficiency Briana Foster has a diagnosis of vitamin D deficiency. She is currently taking OTC vit D and denies nausea, vomiting or muscle weakness.  Elevated Glucose Briana Foster has a history of some elevated blood glucose readings without a diagnosis of diabetes. She admits to polyphagia.  Depression Screen Briana Foster's Food and Mood (modified PHQ-9) score was  Depression screen PHQ 2/9 03/22/2018  Decreased Interest 0  Down, Depressed, Hopeless 1  PHQ - 2 Score 1  Altered sleeping 0  Tired, decreased energy 2  Change in appetite 1  Feeling bad or failure about yourself  2  Trouble concentrating 1  Moving slowly or fidgety/restless 0  Suicidal thoughts 0  PHQ-9 Score 7  Difficult doing work/chores Somewhat difficult    ASSESSMENT AND PLAN:  Other Briana - Plan: EKG 12-Lead, Comprehensive metabolic panel  Shortness of breath on exertion - Plan: Comprehensive metabolic panel  Essential hypertension - Plan: Comprehensive metabolic panel  Vitamin D deficiency - Plan: VITAMIN D 25  Hydroxy (Vit-D Deficiency, Fractures)  Elevated glucose - Plan: Hemoglobin A1c, Insulin, random  Depression screening  At risk for heart disease  Class 1 obesity with serious comorbidity and body mass index (BMI) of 31.0 to 31.9 in adult, unspecified obesity type  PLAN:  Briana Briana Foster was informed that her Briana may be  related to obesity, depression or many other causes. Labs will be ordered, and in the meanwhile Briana Foster has agreed to work on diet, exercise and weight loss to help with Briana. Proper sleep hygiene was discussed including the need for 7-8 hours of quality sleep each night. A sleep study was not ordered based on symptoms and Epworth score.  Dyspnea on exertion Briana Foster's shortness of breath appears to be obesity related and exercise induced. She has agreed to work on weight loss and gradually increase exercise to treat her exercise induced shortness of breath. If Briana Foster follows our instructions and loses weight without improvement of her shortness of breath, we will plan to refer to pulmonology. We will monitor this condition regularly. Briana Foster agrees to this plan.  Hypertension We discussed sodium restriction, working on healthy weight loss, and a regular exercise program as the means to achieve improved blood pressure control. Briana Foster agreed with this plan and agreed to follow up as directed. We will continue to monitor her blood pressure as well as her progress with the above lifestyle modifications and we may need to increase medications or add medications for control. She will continue her medications as prescribed and will watch for signs of hypotension as she continues her lifestyle modifications.  Heart Murmur No action is needed at this time. Briana Foster will follow up with our clinic in 2 weeks.  Cardiovascular risk counseling Briana Foster was given extended (15 minutes) coronary artery disease prevention counseling today. She is 73 y.o. female and has risk factors for heart disease including obesity and hypertension. We discussed intensive lifestyle modifications today with an emphasis on specific weight loss instructions and strategies. Pt was also informed of the importance of increasing exercise and decreasing saturated fats to help prevent heart disease.  Vitamin D Deficiency Briana Foster was  informed that low vitamin D levels contributes to Briana and are associated with obesity, breast, and colon cancer. She will continue OTC Vit D and will follow up for routine testing of vitamin D, at least 2-3 times per year. She was informed of the risk of over-replacement of vitamin D and agrees to not increase her dose unless she discusses this with Briana Foster first. We will check vitamin D level today and Briana Foster will follow up as directed.  Elevated Glucose Fasting labs will be obtained (Hgb A1c, insulin level) and results with be discussed with Briana Foster in 2 weeks at her follow up visit. In the meanwhile Phallon was started on a lower simple carbohydrate diet and will work on weight loss efforts.  Depression Screen Jiana had a mildly positive depression screening. Depression is commonly associated with obesity and often results in emotional eating behaviors. We will monitor this closely and work on CBT to help improve the non-hunger eating patterns. Referral to Psychology may be required if no improvement is seen as she continues in our clinic.  Obesity Courtny is currently in the action stage of change and her goal is to continue with weight loss efforts She has agreed to follow the Category 3 plan Curtina has been instructed to work up to a goal of 150 minutes of combined cardio and strengthening exercise per week for weight loss  and overall health benefits. We discussed the following Behavioral Modification Strategies today: increase H2O intake, keeping healthy foods in the home, increasing lean protein intake, decreasing simple carbohydrates, increasing vegetables and work on meal planning and easy cooking plans  Isadora has agreed to follow up with our clinic in 2 weeks. She was informed of the importance of frequent follow up visits to maximize her success with intensive lifestyle modifications for her multiple health conditions. She was informed we would discuss her lab results at her next  visit unless there is a critical issue that needs to be addressed sooner. Quanisha agreed to keep her next visit at the agreed upon time to discuss these results.  ALLERGIES: Allergies  Allergen Reactions  . Apricot Flavor Swelling    Eyes swell shut  . Bee Venom Other (See Comments)    MEDICATIONS: Current Outpatient Medications on File Prior to Visit  Medication Sig Dispense Refill  . amLODipine-benazepril (LOTREL) 10-20 MG capsule Take 1 capsule by mouth daily.    . calcium-vitamin D (OSCAL WITH D) 500-200 MG-UNIT tablet Take 1 tablet by mouth.    Marland Kitchen albuterol (PROVENTIL HFA;VENTOLIN HFA) 108 (90 Base) MCG/ACT inhaler Inhale 2 puffs into the lungs every 6 (six) hours as needed for wheezing or shortness of breath. (Patient not taking: Reported on 03/22/2018) 1 Inhaler 0  . rOPINIRole (REQUIP) 3 MG tablet Take 3 mg by mouth at bedtime.     No current facility-administered medications on file prior to visit.     PAST MEDICAL HISTORY: Past Medical History:  Diagnosis Date  . Anemia   . Arthritis 07-20-12   hands  . Asthma   . Back pain   . Depression   . Diabetes (HCC)   . GERD (gastroesophageal reflux disease)    "comes and goes" - no meds currently  . Heart murmur    told as teenager  . High blood pressure   . History of hypertension 07-20-12   pt stopped BP med several months ago because BP has been normal-MD is aware  . Hyperlipidemia   . Joint pain   . Restless leg syndrome   . Rheumatoid arthritis (HCC)   . Shortness of breath   . Tiredness   . Vitamin D deficiency     PAST SURGICAL HISTORY: Past Surgical History:  Procedure Laterality Date  . BUNIONECTOMY  20 yrs ago   bil feet  . BUNIONECTOMY  11/30/2011   Procedure: Arbutus Leas;  Surgeon: Drucilla Schmidt, MD;  Location: [facility];  Service: Orthopedics;  Laterality: Bilateral;  RIGHT FOOT EXCISION OF BUNIONETTE AND PARTIAL   PROXIMAL PHALANGECTOMY OF 5TH TOE LEFT FOOT FUNK BUNIONECTOMY,EXCISION OF BUNIONETTE     . COLONOSCOPY WITH PROPOFOL N/A 08/07/2012   Procedure: COLONOSCOPY WITH PROPOFOL;  Surgeon: Charolett Bumpers, MD;  Location: [facility];  Service: Endoscopy;  Laterality: N/A;  . MOUTH SURGERY     teeth extractions  . TONSILLECTOMY    . TUBAL LIGATION    . WRIST SURGERY      SOCIAL HISTORY: Social History   Tobacco Use  . Smoking status: Never Smoker  . Smokeless tobacco: Never Used  Substance Use Topics  . Alcohol use: No  . Drug use: No    FAMILY HISTORY: Family History  Problem Relation Age of Onset  . Heart disease Mother   . Stroke Mother   . Cancer Father     ROS: Review of Systems  Constitutional: Positive for malaise/Briana.  HENT:       +  Hay Fever + Dentures  Eyes:       + Vision Changes + Wear Glasses or Contacts  Respiratory: Positive for shortness of breath.   Cardiovascular: Negative for chest pain and orthopnea.       + Leg Cramping + Shortness of Breath on exertion   Gastrointestinal: Negative for nausea and vomiting.  Musculoskeletal:       Negative for muscle weakness  Endo/Heme/Allergies:       + polyphagia    PHYSICAL EXAM: Blood pressure (!) 168/98, pulse 86, temperature 97.9 F (36.6 C), temperature source Oral, height 5\' 7"  (1.702 m), weight 202 lb (91.6 kg), SpO2 100 %. Body mass index is 31.64 kg/m. Physical Exam Vitals signs reviewed.  Constitutional:      Appearance: Normal appearance. She is well-developed. She is obese.  HENT:     Head: Normocephalic and atraumatic.     Nose: Nose normal.  Eyes:     General: No scleral icterus.    Extraocular Movements: Extraocular movements intact.  Neck:     Musculoskeletal: Normal range of motion and neck supple.     Thyroid: No thyromegaly.  Cardiovascular:     Rate and Rhythm: Normal rate and regular rhythm.     Heart sounds: Murmur present. Systolic murmur present with a grade of 2/6.  Pulmonary:     Effort: Pulmonary effort is normal. No respiratory distress.  Abdominal:      Palpations: Abdomen is soft.     Tenderness: There is no abdominal tenderness.  Musculoskeletal: Normal range of motion.     Comments: Range of Motion normal in all 4 extremities  Skin:    General: Skin is warm and dry.  Neurological:     Mental Status: She is alert and oriented to person, place, and time.     Coordination: Coordination normal.  Psychiatric:        Mood and Affect: Mood normal.        Behavior: Behavior normal.     RECENT LABS AND TESTS: BMET    Component Value Date/Time   NA 139 03/16/2016 2119   K 3.9 03/16/2016 2119   CL 105 03/16/2016 2119   CO2 22 03/16/2016 2119   GLUCOSE 176 (H) 03/16/2016 2119   BUN 14 03/16/2016 2119   CREATININE 0.86 03/16/2016 2119   CALCIUM 9.7 03/16/2016 2119   GFRNONAA >60 03/16/2016 2119   GFRAA >60 03/16/2016 2119   No results found for: HGBA1C No results found for: INSULIN CBC    Component Value Date/Time   WBC 6.8 03/16/2016 2119   RBC 4.02 03/16/2016 2119   HGB 12.3 03/16/2016 2119   HCT 36.4 03/16/2016 2119   PLT 222 03/16/2016 2119   MCV 90.5 03/16/2016 2119   MCH 30.6 03/16/2016 2119   MCHC 33.8 03/16/2016 2119   RDW 13.0 03/16/2016 2119   LYMPHSABS 1.5 08/05/2014 1625   MONOABS 0.7 08/05/2014 1625   EOSABS 0.0 08/05/2014 1625   BASOSABS 0.0 08/05/2014 1625   Iron/TIBC/Ferritin/ %Sat No results found for: IRON, TIBC, FERRITIN, IRONPCTSAT Lipid Panel  No results found for: CHOL, TRIG, HDL, CHOLHDL, VLDL, LDLCALC, LDLDIRECT Hepatic Function Panel  No results found for: PROT, ALBUMIN, AST, ALT, ALKPHOS, BILITOT, BILIDIR, IBILI No results found for: TSH Vitamin D There are no recent lab results  ECG  shows NSR with a rate of 66 BPM INDIRECT CALORIMETER done today shows a VO2 of 288 and a REE of 2004. Her calculated basal metabolic rate is 2563  thus her basal metabolic rate is better than expected.       OBESITY BEHAVIORAL INTERVENTION VISIT  Today's visit was # 1   Starting weight: 202  lbs Starting date: 03/22/2018 Today's weight : 202 lbs Today's date: 03/22/2018 Total lbs lost to date: 0 At least 15 minutes were spent on discussing the following behavioral intervention visit.    03/22/2018  Weight 202 lb (91.6 kg)    ASK: We discussed the diagnosis of obesity with Lorann B Dusza today and Carlise agreed to give [place] permission to discuss obesity behavioral modification therapy today.  ASSESS: Shakila has the diagnosis of obesity and her BMI today is 31.63 Adelae is in the action stage of change   ADVISE: Cierah was educated on the multiple health risks of obesity as well as the benefit of weight loss to improve her health. She was advised of the need for long term treatment and the importance of lifestyle modifications to improve her current health and to decrease her risk of future health problems.  AGREE: Multiple dietary modification options and treatment options were discussed and  Tamirah agreed to follow the recommendations documented in the above note.  ARRANGE: Xela was educated on the importance of frequent visits to treat obesity as outlined per CMS and USPSTF guidelines and agreed to schedule her next follow up appointment today.   Cristi Loron, am acting as Energy manager for [employer]. Manson Passey, DO  I have reviewed the above documentation for accuracy and completeness, and I agree with the above. -Corinna Capra, DO

## 2018-03-23 LAB — COMPREHENSIVE METABOLIC PANEL
A/G RATIO: 2 (ref 1.2–2.2)
ALT: 24 IU/L (ref 0–32)
AST: 15 IU/L (ref 0–40)
Albumin: 4.6 g/dL (ref 3.8–4.8)
Alkaline Phosphatase: 79 IU/L (ref 39–117)
BUN / CREAT RATIO: 19 (ref 12–28)
BUN: 13 mg/dL (ref 8–27)
Bilirubin Total: 0.4 mg/dL (ref 0.0–1.2)
CO2: 24 mmol/L (ref 20–29)
Calcium: 10.1 mg/dL (ref 8.7–10.3)
Chloride: 102 mmol/L (ref 96–106)
Creatinine, Ser: 0.7 mg/dL (ref 0.57–1.00)
GFR calc Af Amer: 102 mL/min/{1.73_m2} (ref >59)
GFR calc non Af Amer: 88 mL/min/{1.73_m2} (ref >59)
Globulin, Total: 2.3 g/dL (ref 1.5–4.5)
Glucose: 106 mg/dL — ABNORMAL HIGH (ref 65–99)
Potassium: 4.5 mmol/L (ref 3.5–5.2)
Sodium: 141 mmol/L (ref 134–144)
Total Protein: 6.9 g/dL (ref 6.0–8.5)

## 2018-03-23 LAB — INSULIN, RANDOM: INSULIN: 24.4 u[IU]/mL (ref 2.6–24.9)

## 2018-03-23 LAB — HEMOGLOBIN A1C
Est. average glucose Bld gHb Est-mCnc: 154 mg/dL
Hgb A1c MFr Bld: 7 % — ABNORMAL HIGH (ref 4.8–5.6)

## 2018-03-23 LAB — VITAMIN D 25 HYDROXY (VIT D DEFICIENCY, FRACTURES): Vit D, 25-Hydroxy: 16.5 ng/mL — ABNORMAL LOW (ref 30.0–100.0)

## 2018-03-26 DIAGNOSIS — E66811 Obesity, class 1: Secondary | ICD-10-CM

## 2018-03-26 DIAGNOSIS — I10 Essential (primary) hypertension: Secondary | ICD-10-CM

## 2018-03-26 DIAGNOSIS — Z6831 Body mass index (BMI) 31.0-31.9, adult: Secondary | ICD-10-CM

## 2018-03-26 DIAGNOSIS — E669 Obesity, unspecified: Secondary | ICD-10-CM

## 2018-03-26 HISTORY — DX: Essential (primary) hypertension: I10

## 2018-03-26 HISTORY — DX: Obesity, unspecified: E66.9

## 2018-03-26 HISTORY — DX: Obesity, class 1: E66.811

## 2018-04-05 ENCOUNTER — Ambulatory Visit (INDEPENDENT_AMBULATORY_CARE_PROVIDER_SITE_OTHER): Payer: Medicare Other | Admitting: Bariatrics

## 2018-04-05 ENCOUNTER — Encounter (INDEPENDENT_AMBULATORY_CARE_PROVIDER_SITE_OTHER): Payer: Self-pay | Admitting: Bariatrics

## 2018-04-05 VITALS — BP 134/70 | HR 71 | Temp 98.0°F | Ht 67.0 in | Wt 200.0 lb

## 2018-04-05 DIAGNOSIS — E559 Vitamin D deficiency, unspecified: Secondary | ICD-10-CM

## 2018-04-05 DIAGNOSIS — Z6831 Body mass index (BMI) 31.0-31.9, adult: Secondary | ICD-10-CM

## 2018-04-05 DIAGNOSIS — E669 Obesity, unspecified: Secondary | ICD-10-CM

## 2018-04-05 DIAGNOSIS — E119 Type 2 diabetes mellitus without complications: Secondary | ICD-10-CM | POA: Diagnosis not present

## 2018-04-09 NOTE — Progress Notes (Signed)
Office: [phone]  /  Fax: [phone]   HPI:   Chief Complaint: OBESITY Briana Foster is here to discuss her progress with her obesity treatment plan. She is on the Category 3 plan and is following her eating plan approximately 30 % of the time. She states she is walking for 60 minutes 7 times per week. Briana Foster states that she has been "out of town" and has not followed the plan as much as she would have liked. Briana Foster still gets hungry at night. Her weight is 200 lb (90.7 kg) today and has had a weight loss of 2 pounds over a period of 2 weeks since her last visit. She has lost 2 lbs since starting treatment with Briana Foster.  Vitamin D deficiency Briana Foster has a diagnosis of vitamin D deficiency. Her last vitamin D level was at 16.5 She is currently taking calcium and vit D and denies nausea, vomiting or muscle weakness.  Diabetes II (new) Briana Foster has a new diagnosis of diabetes type II. She is taking metformin per her PCP. Her last A1c was at 7.0 and last insulin level was at 24.4 She has been working on intensive lifestyle modifications including diet, exercise, and weight loss to help control her blood glucose levels.  ASSESSMENT AND PLAN:  Vitamin D deficiency - Plan: Vitamin D, Ergocalciferol, (DRISDOL) 1.25 MG (50000 UT) CAPS capsule  Type 2 diabetes mellitus without complication, without long-term current use of insulin (HCC)  Class 1 obesity with serious comorbidity and body mass index (BMI) of 31.0 to 31.9 in adult, unspecified obesity type  PLAN:  Vitamin D Deficiency Briana Foster was informed that low vitamin D levels contributes to fatigue and are associated with obesity, breast, and colon cancer. She agrees to take prescription Vit D @50 ,000 IU every week #4 with no refills and follow up for routine testing of vitamin D, at least 2-3 times per year. She was informed of the risk of over-replacement of vitamin D and agrees to not increase her dose unless she discusses this with Briana Foster first.  Briana Foster agrees to follow up as directed.  Diabetes II (new) Briana Foster has been given extensive diabetes education by myself today including ideal fasting and post-prandial blood glucose readings, individual ideal Hgb A1c goals  and hypoglycemia prevention. We discussed the importance of good blood sugar control to decrease the likelihood of diabetic complications such as nephropathy, neuropathy, limb loss, blindness, coronary artery disease, and death. We discussed the importance of intensive lifestyle modification including diet, exercise and weight loss as the first line treatment for diabetes. Briana Foster agrees to continue metformin per her PCP and follow up with our clinic at the agreed upon time.  Obesity Briana Foster is currently in the action stage of change. As such, her goal is to continue with weight loss efforts She has agreed to follow the Category 3 plan with options for breakfast and lunch Briana Foster has been instructed to work up to a goal of 150 minutes of combined cardio and strengthening exercise per week for weight loss and overall health benefits. We discussed the following Behavioral Modification Strategies today: increase H2O intake, no skipping meals, keeping healthy foods in the home, increasing lean protein intake, decreasing simple carbohydrates, increasing vegetables, decrease eating out and work on meal planning and easy cooking plans  Briana Foster has agreed to follow up with our clinic in 2 weeks. She was informed of the importance of frequent follow up visits to maximize her success with intensive lifestyle modifications for her multiple health conditions.  ALLERGIES: Allergies  Allergen Reactions  . Apricot Flavor Swelling    Eyes swell shut  . Bee Venom Other (See Comments)    MEDICATIONS: Current Outpatient Medications on File Prior to Visit  Medication Sig Dispense Refill  . albuterol (PROVENTIL HFA;VENTOLIN HFA) 108 (90 Base) MCG/ACT inhaler Inhale 2 puffs into the lungs  every 6 (six) hours as needed for wheezing or shortness of breath. 1 Inhaler 0  . amLODipine-benazepril (LOTREL) 10-20 MG capsule Take 1 capsule by mouth daily.    . calcium-vitamin D (OSCAL WITH D) 500-200 MG-UNIT tablet Take 1 tablet by mouth.    Marland Kitchen rOPINIRole (REQUIP) 3 MG tablet Take 3 mg by mouth at bedtime.     No current facility-administered medications on file prior to visit.     PAST MEDICAL HISTORY: Past Medical History:  Diagnosis Date  . Anemia   . Arthritis 07-20-12   hands  . Asthma   . Back pain   . Depression   . Diabetes (HCC)   . GERD (gastroesophageal reflux disease)    "comes and goes" - no meds currently  . Heart murmur    told as teenager  . High blood pressure   . History of hypertension 07-20-12   pt stopped BP med several months ago because BP has been normal-MD is aware  . Hyperlipidemia   . Joint pain   . Restless leg syndrome   . Rheumatoid arthritis (HCC)   . Shortness of breath   . Tiredness   . Vitamin D deficiency     PAST SURGICAL HISTORY: Past Surgical History:  Procedure Laterality Date  . BUNIONECTOMY  20 yrs ago   bil feet  . BUNIONECTOMY  11/30/2011   Procedure: Arbutus Leas;  Surgeon: Drucilla Schmidt, MD;  Location: [facility];  Service: Orthopedics;  Laterality: Bilateral;  RIGHT FOOT EXCISION OF BUNIONETTE AND PARTIAL   PROXIMAL PHALANGECTOMY OF 5TH TOE LEFT FOOT FUNK BUNIONECTOMY,EXCISION OF BUNIONETTE   . COLONOSCOPY WITH PROPOFOL N/A 08/07/2012   Procedure: COLONOSCOPY WITH PROPOFOL;  Surgeon: Charolett Bumpers, MD;  Location: [facility];  Service: Endoscopy;  Laterality: N/A;  . MOUTH SURGERY     teeth extractions  . TONSILLECTOMY    . TUBAL LIGATION    . WRIST SURGERY      SOCIAL HISTORY: Social History   Tobacco Use  . Smoking status: Never Smoker  . Smokeless tobacco: Never Used  Substance Use Topics  . Alcohol use: No  . Drug use: No    FAMILY HISTORY: Family History  Problem Relation Age of Onset  . Heart  disease Mother   . Stroke Mother   . Cancer Father     ROS: Review of Systems  Constitutional: Positive for weight loss.  Gastrointestinal: Negative for nausea and vomiting.  Musculoskeletal:       Negative for muscle weakness    PHYSICAL EXAM: Blood pressure 134/70, pulse 71, temperature 98 F (36.7 C), temperature source Oral, height 5\' 7"  (1.702 m), weight 200 lb (90.7 kg), SpO2 96 %. Body mass index is 31.32 kg/m. Physical Exam Vitals signs reviewed.  Constitutional:      Appearance: Normal appearance. She is well-developed. She is obese.  Cardiovascular:     Rate and Rhythm: Normal rate.  Pulmonary:     Effort: Pulmonary effort is normal.  Musculoskeletal: Normal range of motion.  Skin:    General: Skin is warm and dry.  Neurological:     Mental Status: She is alert and  oriented to person, place, and time.  Psychiatric:        Mood and Affect: Mood normal.        Behavior: Behavior normal.     RECENT LABS AND TESTS: BMET    Component Value Date/Time   NA 141 03/22/2018 1329   K 4.5 03/22/2018 1329   CL 102 03/22/2018 1329   CO2 24 03/22/2018 1329   GLUCOSE 106 (H) 03/22/2018 1329   GLUCOSE 176 (H) 03/16/2016 2119   BUN 13 03/22/2018 1329   CREATININE 0.70 03/22/2018 1329   CALCIUM 10.1 03/22/2018 1329   GFRNONAA 88 03/22/2018 1329   GFRAA 102 03/22/2018 1329   Lab Results  Component Value Date   HGBA1C 7.0 (H) 03/22/2018   Lab Results  Component Value Date   INSULIN 24.4 03/22/2018   CBC    Component Value Date/Time   WBC 6.8 03/16/2016 2119   RBC 4.02 03/16/2016 2119   HGB 12.3 03/16/2016 2119   HCT 36.4 03/16/2016 2119   PLT 222 03/16/2016 2119   MCV 90.5 03/16/2016 2119   MCH 30.6 03/16/2016 2119   MCHC 33.8 03/16/2016 2119   RDW 13.0 03/16/2016 2119   LYMPHSABS 1.5 08/05/2014 1625   MONOABS 0.7 08/05/2014 1625   EOSABS 0.0 08/05/2014 1625   BASOSABS 0.0 08/05/2014 1625   Iron/TIBC/Ferritin/ %Sat No results found for: IRON, TIBC,  FERRITIN, IRONPCTSAT Lipid Panel  No results found for: CHOL, TRIG, HDL, CHOLHDL, VLDL, LDLCALC, LDLDIRECT Hepatic Function Panel     Component Value Date/Time   PROT 6.9 03/22/2018 1329   ALBUMIN 4.6 03/22/2018 1329   AST 15 03/22/2018 1329   ALT 24 03/22/2018 1329   ALKPHOS 79 03/22/2018 1329   BILITOT 0.4 03/22/2018 1329   No results found for: TSH   Ref. Range 03/22/2018 13:29  Vitamin D, 25-Hydroxy Latest Ref Range: 30.0 - 100.0 ng/mL 16.5 (L)     OBESITY BEHAVIORAL INTERVENTION VISIT  Today's visit was # 2   Starting weight: 202 lbs Starting date: 03/22/2018 Today's weight : 200 lbs  Today's date: 04/05/2018 Total lbs lost to date: 2 At least 15 minutes were spent on discussing the following behavioral intervention visit.    04/05/2018  Height 5\' 7"  (1.702 m)  Weight 200 lb (90.7 kg)  BMI (Calculated) 31.32  BLOOD PRESSURE - SYSTOLIC 134  BLOOD PRESSURE - DIASTOLIC 70   Body Fat % 45.1 %  Total Body Water (lbs) 71.4 lbs    ASK: We discussed the diagnosis of obesity with Briana Foster today and Briana Foster agreed to give [place] permission to discuss obesity behavioral modification therapy today.  ASSESS: Briana Foster has the diagnosis of obesity and her BMI today is 31.32 Deaisha is in the action stage of change   ADVISE: Briana Foster was educated on the multiple health risks of obesity as well as the benefit of weight loss to improve her health. She was advised of the need for long term treatment and the importance of lifestyle modifications to improve her current health and to decrease her risk of future health problems.  AGREE: Multiple dietary modification options and treatment options were discussed and  Briana Foster agreed to follow the recommendations documented in the above note.  ARRANGE: Briana Foster was educated on the importance of frequent visits to treat obesity as outlined per CMS and USPSTF guidelines and agreed to schedule her next follow up appointment today.  Cristi Loron, am acting as Energy manager for [employer]. Manson Passey, DO  I  have reviewed the above documentation for accuracy and completeness, and I agree with the above. -Jearld Lesch, DO

## 2018-04-10 DIAGNOSIS — E119 Type 2 diabetes mellitus without complications: Secondary | ICD-10-CM | POA: Insufficient documentation

## 2018-04-10 DIAGNOSIS — E559 Vitamin D deficiency, unspecified: Secondary | ICD-10-CM | POA: Insufficient documentation

## 2018-04-10 DIAGNOSIS — E1165 Type 2 diabetes mellitus with hyperglycemia: Secondary | ICD-10-CM | POA: Insufficient documentation

## 2018-04-10 HISTORY — DX: Type 2 diabetes mellitus without complications: E11.9

## 2018-04-10 MED ORDER — VITAMIN D (ERGOCALCIFEROL) 1.25 MG (50000 UNIT) PO CAPS: 50000.0000 [IU] | ORAL_CAPSULE | ORAL | 0 refills | Status: DC

## 2018-04-25 ENCOUNTER — Ambulatory Visit (INDEPENDENT_AMBULATORY_CARE_PROVIDER_SITE_OTHER): Payer: Medicare Other | Admitting: Bariatrics

## 2018-04-26 ENCOUNTER — Encounter (INDEPENDENT_AMBULATORY_CARE_PROVIDER_SITE_OTHER): Payer: Self-pay

## 2018-06-18 DIAGNOSIS — M13841 Other specified arthritis, right hand: Secondary | ICD-10-CM | POA: Diagnosis not present

## 2018-06-18 DIAGNOSIS — M79645 Pain in left finger(s): Secondary | ICD-10-CM | POA: Diagnosis not present

## 2018-06-18 DIAGNOSIS — M25532 Pain in left wrist: Secondary | ICD-10-CM | POA: Diagnosis not present

## 2018-06-18 DIAGNOSIS — G5621 Lesion of ulnar nerve, right upper limb: Secondary | ICD-10-CM | POA: Diagnosis not present

## 2018-06-18 DIAGNOSIS — M13132 Monoarthritis, not elsewhere classified, left wrist: Secondary | ICD-10-CM | POA: Diagnosis not present

## 2018-08-01 DIAGNOSIS — I10 Essential (primary) hypertension: Secondary | ICD-10-CM | POA: Diagnosis not present

## 2018-08-01 DIAGNOSIS — E538 Deficiency of other specified B group vitamins: Secondary | ICD-10-CM | POA: Diagnosis not present

## 2018-08-01 DIAGNOSIS — J45909 Unspecified asthma, uncomplicated: Secondary | ICD-10-CM | POA: Diagnosis not present

## 2018-08-01 DIAGNOSIS — G2581 Restless legs syndrome: Secondary | ICD-10-CM | POA: Diagnosis not present

## 2018-08-01 DIAGNOSIS — E1169 Type 2 diabetes mellitus with other specified complication: Secondary | ICD-10-CM | POA: Diagnosis not present

## 2018-08-01 DIAGNOSIS — E782 Mixed hyperlipidemia: Secondary | ICD-10-CM | POA: Diagnosis not present

## 2018-08-01 DIAGNOSIS — I350 Nonrheumatic aortic (valve) stenosis: Secondary | ICD-10-CM | POA: Diagnosis not present

## 2018-08-01 DIAGNOSIS — F325 Major depressive disorder, single episode, in full remission: Secondary | ICD-10-CM | POA: Diagnosis not present

## 2018-08-01 DIAGNOSIS — Z0000 Encounter for general adult medical examination without abnormal findings: Secondary | ICD-10-CM | POA: Diagnosis not present

## 2018-08-17 DIAGNOSIS — E538 Deficiency of other specified B group vitamins: Secondary | ICD-10-CM | POA: Diagnosis not present

## 2018-08-17 DIAGNOSIS — E1169 Type 2 diabetes mellitus with other specified complication: Secondary | ICD-10-CM | POA: Diagnosis not present

## 2018-08-17 DIAGNOSIS — Z7984 Long term (current) use of oral hypoglycemic drugs: Secondary | ICD-10-CM | POA: Diagnosis not present

## 2018-08-17 DIAGNOSIS — E782 Mixed hyperlipidemia: Secondary | ICD-10-CM | POA: Diagnosis not present

## 2018-08-17 DIAGNOSIS — I10 Essential (primary) hypertension: Secondary | ICD-10-CM | POA: Diagnosis not present

## 2018-09-05 ENCOUNTER — Other Ambulatory Visit: Payer: Self-pay | Admitting: Internal Medicine

## 2018-09-05 DIAGNOSIS — Z1231 Encounter for screening mammogram for malignant neoplasm of breast: Secondary | ICD-10-CM

## 2018-09-25 DIAGNOSIS — D649 Anemia, unspecified: Secondary | ICD-10-CM | POA: Diagnosis not present

## 2018-09-25 DIAGNOSIS — E782 Mixed hyperlipidemia: Secondary | ICD-10-CM | POA: Diagnosis not present

## 2018-09-25 DIAGNOSIS — F325 Major depressive disorder, single episode, in full remission: Secondary | ICD-10-CM | POA: Diagnosis not present

## 2018-09-25 DIAGNOSIS — F329 Major depressive disorder, single episode, unspecified: Secondary | ICD-10-CM | POA: Diagnosis not present

## 2018-09-25 DIAGNOSIS — E1169 Type 2 diabetes mellitus with other specified complication: Secondary | ICD-10-CM | POA: Diagnosis not present

## 2018-09-25 DIAGNOSIS — I10 Essential (primary) hypertension: Secondary | ICD-10-CM | POA: Diagnosis not present

## 2018-09-25 DIAGNOSIS — M1990 Unspecified osteoarthritis, unspecified site: Secondary | ICD-10-CM | POA: Diagnosis not present

## 2018-09-25 DIAGNOSIS — J45909 Unspecified asthma, uncomplicated: Secondary | ICD-10-CM | POA: Diagnosis not present

## 2018-10-02 DIAGNOSIS — E538 Deficiency of other specified B group vitamins: Secondary | ICD-10-CM | POA: Diagnosis not present

## 2018-10-02 DIAGNOSIS — M546 Pain in thoracic spine: Secondary | ICD-10-CM | POA: Diagnosis not present

## 2018-10-02 DIAGNOSIS — K219 Gastro-esophageal reflux disease without esophagitis: Secondary | ICD-10-CM | POA: Diagnosis not present

## 2018-10-02 DIAGNOSIS — R079 Chest pain, unspecified: Secondary | ICD-10-CM | POA: Diagnosis not present

## 2018-10-03 ENCOUNTER — Telehealth: Payer: Self-pay

## 2018-10-03 NOTE — Telephone Encounter (Signed)
NOTES ON FILE FROM DR Lysle Rubens [phone], SENT REFERRAL TO SCHEDULING

## 2018-10-15 DIAGNOSIS — J45909 Unspecified asthma, uncomplicated: Secondary | ICD-10-CM | POA: Diagnosis not present

## 2018-10-15 DIAGNOSIS — I10 Essential (primary) hypertension: Secondary | ICD-10-CM | POA: Diagnosis not present

## 2018-10-15 DIAGNOSIS — F329 Major depressive disorder, single episode, unspecified: Secondary | ICD-10-CM | POA: Diagnosis not present

## 2018-10-15 DIAGNOSIS — E782 Mixed hyperlipidemia: Secondary | ICD-10-CM | POA: Diagnosis not present

## 2018-10-15 DIAGNOSIS — E1169 Type 2 diabetes mellitus with other specified complication: Secondary | ICD-10-CM | POA: Diagnosis not present

## 2018-10-15 DIAGNOSIS — M1990 Unspecified osteoarthritis, unspecified site: Secondary | ICD-10-CM | POA: Diagnosis not present

## 2018-10-15 DIAGNOSIS — F325 Major depressive disorder, single episode, in full remission: Secondary | ICD-10-CM | POA: Diagnosis not present

## 2018-10-15 DIAGNOSIS — D649 Anemia, unspecified: Secondary | ICD-10-CM | POA: Diagnosis not present

## 2018-10-18 ENCOUNTER — Other Ambulatory Visit: Payer: Self-pay

## 2018-10-18 ENCOUNTER — Ambulatory Visit
Admission: RE | Admit: 2018-10-18 | Discharge: 2018-10-18 | Disposition: A | Discharge status: Home / Self Care | Payer: Medicare Other | Source: Ambulatory Visit | Attending: Internal Medicine | Admitting: Internal Medicine

## 2018-10-18 DIAGNOSIS — Z1231 Encounter for screening mammogram for malignant neoplasm of breast: Secondary | ICD-10-CM

## 2018-10-28 NOTE — Progress Notes (Signed)
Cardiology Office Note:    Date:  10/29/2018   ID:  Briana ShockDelores B Maxson, DOB 1945-07-13, MRN [id number]  PCP:  Georgann HousekeeperHusain, Karrar, MD  Cardiologist:  No primary care provider on file.   Referring MD: Georgann HousekeeperHusain, Karrar, MD   Chief Complaint  Patient presents with  . Chest Pain  . Cardiac Valve Problem    Aortic stenosis    History of Present Illness:    Briana Foster is a 73 y.o. female with a hx of hypertension, DM 2, cardiomegaly, and referred for chest pain evaluation by Dr. Donette LarryHusain.   The patient had a single episode of chest discomfort in the lower sternal xiphoid area associated with back discomfort that awakened her from sleep.  This episode lasted approximately 15 minutes before resolving.  The pain was severe.  There was some diaphoresis but no shortness of breath.  It is not recurred since that time.  She has a family history of coronary disease.  Her brother, Renette ButtersGolden, recently died of complications of widespread vascular disease including end-stage kidney disease, systolic heart failure, and CVA.  He was also diabetic.  In reviewing information from Dr. Eula ListenHussain, the patient is diabetic, she has never smoked, she has hypertension, and severe hyperlipidemia that is untreated.  When seen by Dr. Eula ListenHussain, a statin was prescribed but she has not filled the prescription.  She has no knowledge of having a heart murmur.  Past Medical History:  Diagnosis Date  . Anemia   . Arthritis 07-20-12   hands  . Asthma   . Back pain   . Cardiomegaly   . Class 1 obesity with serious comorbidity and body mass index (BMI) of 31.0 to 31.9 in adult 03/26/2018  . Depression   . Diabetes (HCC)   . Essential hypertension 03/26/2018  . GERD (gastroesophageal reflux disease)    "comes and goes" - no meds currently  . Heart murmur    told as teenager  . High blood pressure   . History of hypertension 07-20-12   pt stopped BP med several months ago because BP has been normal-MD is aware  .  Hyperlipidemia   . Joint pain   . Restless leg syndrome   . Rheumatoid arthritis (HCC)   . Shortness of breath   . Tiredness   . Type 2 diabetes mellitus without complication, without long-term current use of insulin (HCC) 04/10/2018  . Vitamin D deficiency     Past Surgical History:  Procedure Laterality Date  . BUNIONECTOMY  20 yrs ago   bil feet  . BUNIONECTOMY  11/30/2011   Procedure: Arbutus LeasBUNIONECTOMY;  Surgeon: Drucilla SchmidtJames P Aplington, MD;  Location: [facility];  Service: Orthopedics;  Laterality: Bilateral;  RIGHT FOOT EXCISION OF BUNIONETTE AND PARTIAL   PROXIMAL PHALANGECTOMY OF 5TH TOE LEFT FOOT FUNK BUNIONECTOMY,EXCISION OF BUNIONETTE   . COLONOSCOPY WITH PROPOFOL N/A 08/07/2012   Procedure: COLONOSCOPY WITH PROPOFOL;  Surgeon: Charolett BumpersMartin K Johnson, MD;  Location: [facility];  Service: Endoscopy;  Laterality: N/A;  . MOUTH SURGERY     teeth extractions  . TONSILLECTOMY    . TUBAL LIGATION    . WRIST SURGERY      Current Medications: Current Meds  Medication Sig  . albuterol (PROVENTIL HFA;VENTOLIN HFA) 108 (90 Base) MCG/ACT inhaler Inhale 2 puffs into the lungs every 6 (six) hours as needed for wheezing or shortness of breath.  Marland Kitchen. amLODipine-benazepril (LOTREL) 10-20 MG capsule Take 1 capsule by mouth daily.  . calcium-vitamin D (OSCAL WITH D) 500-200  MG-UNIT tablet Take 1 tablet by mouth.  Marland Kitchen rOPINIRole (REQUIP) 3 MG tablet Take 3 mg by mouth at bedtime.  . Vitamin D, Ergocalciferol, (DRISDOL) 1.25 MG (50000 UT) CAPS capsule Take 1 capsule (50,000 Units total) by mouth every 7 (seven) days.     Allergies:   Apricot flavor and Bee venom   Social History   Socioeconomic History  . Marital status: Widowed    Spouse name: Not on file  . Number of children: Not on file  . Years of education: Not on file  . Highest education level: Not on file  Occupational History  . Occupation: Retired  Scientific laboratory technician  . Financial resource strain: Not on file  . Food insecurity    Worry: Not on  file    Inability: Not on file  . Transportation needs    Medical: Not on file    Non-medical: Not on file  Tobacco Use  . Smoking status: Never Smoker  . Smokeless tobacco: Never Used  Substance and Sexual Activity  . Alcohol use: No  . Drug use: No  . Sexual activity: Not Currently  Lifestyle  . Physical activity    Days per week: Not on file    Minutes per session: Not on file  . Stress: Not on file  Relationships  . Social Herbalist on phone: Not on file    Gets together: Not on file    Attends religious service: Not on file    Active member of club or organization: Not on file    Attends meetings of clubs or organizations: Not on file    Relationship status: Not on file  Other Topics Concern  . Not on file  Social History Narrative  . Not on file     Family History: The patient's family history includes Cancer in her father; Heart disease in her mother; Stroke in her mother.  ROS:   Please see the history of present illness.    Since the episode of chest and back discomfort she has had no recurrence.  Her exertional tolerance is unchanged.  She has some difficulty sleeping because of right shoulder discomfort.  She denies orthopnea, PND, and edema.  All other systems reviewed and are negative.  EKGs/Labs/Other Studies Reviewed:    The following studies were reviewed today: No cardiac imaging.  EKG:  EKG normal sinus rhythm, leftward axis, and nonspecific ST abnormality is noted on prior tracing performed today.  No tracings are available for comparison.  Recent Labs: 03/22/2018: ALT 24; BUN 13; Creatinine, Ser 0.70; Potassium 4.5; Sodium 141  Recent Lipid Panel No results found for: CHOL, TRIG, HDL, CHOLHDL, VLDL, LDLCALC, LDLDIRECT  Physical Exam:    VS:  BP (!) 156/82   Pulse 75   Ht 5\' 7"  (1.702 m)   Wt 206 lb 14.4 oz (93.8 kg)   SpO2 94%   BMI 32.41 kg/m     Wt Readings from Last 3 Encounters:  10/29/18 206 lb 14.4 oz (93.8 kg)   04/05/18 200 lb (90.7 kg)  03/22/18 202 lb (91.6 kg)     GEN: Obese.. No acute distress HEENT: Normal NECK: No JVD. LYMPHATICS: No lymphadenopathy CARDIAC:  RRR without murmur, gallop, or edema. VASCULAR:  Normal Pulses. No bruits. RESPIRATORY:  Clear to auscultation without rales, wheezing or rhonchi  ABDOMEN: Soft, non-tender, non-distended, No pulsatile mass, MUSCULOSKELETAL: No deformity  SKIN: Warm and dry NEUROLOGIC:  Alert and oriented x 3 PSYCHIATRIC:  Normal affect  ASSESSMENT:    1. Chest pain of uncertain etiology   2. Essential hypertension   3. Type 2 diabetes mellitus without complication, without long-term current use of insulin (HCC)   4. Hyperlipidemia LDL goal <70   5. Aortic valve stenosis, etiology of cardiac valve disease unspecified   6. Educated About Covid-19 Virus Infection    PLAN:    In order of problems listed above:  1. One episode that resolved spontaneously.  Given her family history, and risk factors including diabetes, and significant hyperlipidemia I am concerned that this episode could have been a transient ischemic event.  I am encouraged however that she has had no recurrence and the initial episode lasted less than 15 minutes.  This may not of been cardiac. 2. Blood pressure target 130/80 mmHg. 3. Target hemoglobin A1c less than 7. 4. LDL target should be less than 70.  I have encouraged her to start the statin recommended by Dr. Eula ListenHussain after she calls back the name of the medication and dose prescribed.  Based upon her current levels, she will need to be on at least moderate intensity statin therapy. 5. She has a significant systolic murmur compatible with aortic stenosis.  2D Doppler echocardiogram will be performed to assess.  Since she awakened from sleep with chest pain, given her body habitus, a sleep study may not also need to be considered. 6. Social distancing, mask wearing, and handwashing is emphasized.  Primary prevention of  ischemic cardiac events is discussed in detail: Overall education and awareness concerning primary risk prevention was discussed in detail: LDL less than 70, hemoglobin A1c less than 7, blood pressure target less than 130/80 mmHg, >150 minutes of moderate aerobic activity per week, avoidance of smoking, weight control (via diet and exercise), and continued surveillance/management of/for obstructive sleep apnea.  She will need a 6-week follow-up appointment.   Medication Adjustments/Labs and Tests Ordered: Current medicines are reviewed at length with the patient today.  Concerns regarding medicines are outlined above.  Orders Placed This Encounter  Procedures  . EKG 12-Lead  . ECHOCARDIOGRAM COMPLETE   No orders of the defined types were placed in this encounter.   Patient Instructions  Medication Instructions:  1) Please find out what cholesterol medication and the dose that Dr. Donette LarryHusain put you on and let us know.  If you need a refill on your cardiac medications before your next appointment, please call your pharmacy.   Lab work: None If you have labs (blood work) drawn today and your tests are completely normal, you will receive your results only by: Marland Kitchen. MyChart Message (if you have MyChart) OR . A paper copy in the mail If you have any lab test that is abnormal or we need to change your treatment, we will call you to review the results.  Testing/Procedures: Your physician has requested that you have an echocardiogram. Echocardiography is a painless test that uses sound waves to create images of your heart. It provides your doctor with information about the size and shape of your heart and how well your heart's chambers and valves are working. This procedure takes approximately one hour. There are no restrictions for this procedure.   Follow-Up: At [facility], you and your health needs are our priority.  As part of our continuing mission to provide you with exceptional heart  care, we have created designated Provider Care Teams.  These Care Teams include your primary Cardiologist (physician) and Advanced Practice Providers (APPs -  Physician Assistants and  Nurse Practitioners) who all work together to provide you with the care you need, when you need it. You will need a follow up appointment in 6 weeks (Can have 11/9 at 9:40A or 11/11 at 1:20P).  Please call our office 2 months in advance to schedule this appointment.  You may see Dr. Verdis Prime or one of the following Advanced Practice Providers on your designated Care Team:   Norma Fredrickson, NP Nada Boozer, NP . Georgie Chard, NP  Any Other Special Instructions Will Be Listed Below (If Applicable).       Signed, Lesleigh Noe, MD  10/29/2018 4:56 PM    [facility]

## 2018-10-29 ENCOUNTER — Other Ambulatory Visit: Payer: Self-pay

## 2018-10-29 ENCOUNTER — Encounter: Payer: Self-pay | Admitting: Interventional Cardiology

## 2018-10-29 ENCOUNTER — Ambulatory Visit (INDEPENDENT_AMBULATORY_CARE_PROVIDER_SITE_OTHER): Payer: Medicare Other | Admitting: Interventional Cardiology

## 2018-10-29 VITALS — BP 156/82 | HR 75 | Ht 67.0 in | Wt 206.9 lb

## 2018-10-29 DIAGNOSIS — I10 Essential (primary) hypertension: Secondary | ICD-10-CM

## 2018-10-29 DIAGNOSIS — R0789 Other chest pain: Secondary | ICD-10-CM

## 2018-10-29 DIAGNOSIS — R079 Chest pain, unspecified: Secondary | ICD-10-CM

## 2018-10-29 DIAGNOSIS — E119 Type 2 diabetes mellitus without complications: Secondary | ICD-10-CM | POA: Diagnosis not present

## 2018-10-29 DIAGNOSIS — I350 Nonrheumatic aortic (valve) stenosis: Secondary | ICD-10-CM

## 2018-10-29 DIAGNOSIS — Z7189 Other specified counseling: Secondary | ICD-10-CM

## 2018-10-29 DIAGNOSIS — E785 Hyperlipidemia, unspecified: Secondary | ICD-10-CM

## 2018-10-29 NOTE — Patient Instructions (Addendum)
Medication Instructions:  1) Please find out what cholesterol medication and the dose that Dr. Lysle Rubens put you on and let us know.  If you need a refill on your cardiac medications before your next appointment, please call your pharmacy.   Lab work: None If you have labs (blood work) drawn today and your tests are completely normal, you will receive your results only by: Marland Kitchen MyChart Message (if you have MyChart) OR . A paper copy in the mail If you have any lab test that is abnormal or we need to change your treatment, we will call you to review the results.  Testing/Procedures: Your physician has requested that you have an echocardiogram. Echocardiography is a painless test that uses sound waves to create images of your heart. It provides your doctor with information about the size and shape of your heart and how well your heart's chambers and valves are working. This procedure takes approximately one hour. There are no restrictions for this procedure.   Follow-Up: At [facility], you and your health needs are our priority.  As part of our continuing mission to provide you with exceptional heart care, we have created designated Provider Care Teams.  These Care Teams include your primary Cardiologist (physician) and Advanced Practice Providers (APPs -  Physician Assistants and Nurse Practitioners) who all work together to provide you with the care you need, when you need it. You will need a follow up appointment in 6 weeks (Can have 11/9 at 9:40A or 11/11 at 1:20P).  Please call our office 2 months in advance to schedule this appointment.  You may see Dr. Daneen Schick or one of the following Advanced Practice Providers on your designated Care Team:   Truitt Merle, NP Cecilie Kicks, NP . Kathyrn Drown, NP  Any Other Special Instructions Will Be Listed Below (If Applicable).

## 2018-10-30 ENCOUNTER — Telehealth: Payer: Self-pay | Admitting: Interventional Cardiology

## 2018-10-30 NOTE — Telephone Encounter (Signed)
New Message  Patient is calling in to report the medication that she is taking for her cholesterol.   States she is taking: Rosuvastatin 20 mg taken once a day.

## 2018-10-30 NOTE — Telephone Encounter (Signed)
This is a good dose of the medication I would prescribe. She needs to get started ASAP.

## 2018-10-30 NOTE — Telephone Encounter (Signed)
Left message to call back  

## 2018-10-30 NOTE — Telephone Encounter (Signed)
Will route to Dr. Smith to make him aware. 

## 2018-10-31 NOTE — Telephone Encounter (Signed)
LM with pt regarding Dr. Darliss Ridgel advisement. Encouraged pt to call back with questions.

## 2018-10-31 NOTE — Telephone Encounter (Signed)
Patient is returning call.  °

## 2018-11-06 ENCOUNTER — Other Ambulatory Visit: Payer: Self-pay

## 2018-11-06 ENCOUNTER — Ambulatory Visit (HOSPITAL_COMMUNITY): Payer: Medicare Other | Attending: Cardiology

## 2018-11-06 DIAGNOSIS — I350 Nonrheumatic aortic (valve) stenosis: Secondary | ICD-10-CM

## 2018-11-06 DIAGNOSIS — R079 Chest pain, unspecified: Secondary | ICD-10-CM | POA: Insufficient documentation

## 2018-11-08 ENCOUNTER — Encounter: Payer: Self-pay | Admitting: Interventional Cardiology

## 2018-11-15 ENCOUNTER — Telehealth: Payer: Self-pay | Admitting: Interventional Cardiology

## 2018-11-15 NOTE — Telephone Encounter (Signed)
New message: ° ° ° °Patient calling stating that some one called her. Please call patient back. °

## 2018-11-15 NOTE — Telephone Encounter (Signed)
Pt aware of echo results 

## 2018-11-16 NOTE — Telephone Encounter (Signed)
Patient states nurse called her again advising her to call the office back.

## 2018-11-16 NOTE — Telephone Encounter (Signed)
Spoke with pt and made her aware to keep November appt.

## 2018-11-21 DIAGNOSIS — M1990 Unspecified osteoarthritis, unspecified site: Secondary | ICD-10-CM | POA: Diagnosis not present

## 2018-11-21 DIAGNOSIS — E1169 Type 2 diabetes mellitus with other specified complication: Secondary | ICD-10-CM | POA: Diagnosis not present

## 2018-11-21 DIAGNOSIS — D649 Anemia, unspecified: Secondary | ICD-10-CM | POA: Diagnosis not present

## 2018-11-21 DIAGNOSIS — E782 Mixed hyperlipidemia: Secondary | ICD-10-CM | POA: Diagnosis not present

## 2018-11-21 DIAGNOSIS — J45909 Unspecified asthma, uncomplicated: Secondary | ICD-10-CM | POA: Diagnosis not present

## 2018-11-21 DIAGNOSIS — F325 Major depressive disorder, single episode, in full remission: Secondary | ICD-10-CM | POA: Diagnosis not present

## 2018-11-21 DIAGNOSIS — I10 Essential (primary) hypertension: Secondary | ICD-10-CM | POA: Diagnosis not present

## 2018-11-21 DIAGNOSIS — F329 Major depressive disorder, single episode, unspecified: Secondary | ICD-10-CM | POA: Diagnosis not present

## 2018-11-29 DIAGNOSIS — R42 Dizziness and giddiness: Secondary | ICD-10-CM | POA: Diagnosis not present

## 2018-12-09 NOTE — Progress Notes (Signed)
Cardiology Office Note:    Date:  12/10/2018   ID:  Briana Foster, DOB [birth year], MRN [id number]  PCP:  Wenda Low, MD  Cardiologist:  Sinclair Grooms, MD   Referring MD: Wenda Low, MD   Chief Complaint  Patient presents with  . Cardiac Valve Problem    History of Present Illness:    Briana Foster is a 73 y.o. female with a hx of hypertension, DM 2, cardiomegaly, chest pain evaluation (no recurrence) , and aortic stenosis (Moderate to severe 10/20200).  In 218 the peak aortic valve velocity was 284 cm/s.  The most recent echo at is 344 cm/s.  She is doing well.  She states that she is always tired and has no energy.  This preceded worsening of her aortic valve.  She has not had angina, syncope, orthopnea, edema, or other significant CV complaint.  We spent significant time discussing proper follow-up of calcific aortic stenosis.  Past Medical History:  Diagnosis Date  . Anemia   . Arthritis 07-20-12   hands  . Asthma   . Back pain   . Cardiomegaly   . Class 1 obesity with serious comorbidity and body mass index (BMI) of 31.0 to 31.9 in adult 03/26/2018  . Depression   . Diabetes ([place])   . Essential hypertension 03/26/2018  . GERD (gastroesophageal reflux disease)    "comes and goes" - no meds currently  . Heart murmur    told as teenager  . High blood pressure   . History of hypertension 07-20-12   pt stopped BP med several months ago because BP has been normal-MD is aware  . Hyperlipidemia   . Joint pain   . Restless leg syndrome   . Rheumatoid arthritis ([place])   . Shortness of breath   . Tiredness   . Type 2 diabetes mellitus without complication, without long-term current use of insulin ([place]) 04/10/2018  . Vitamin D deficiency     Past Surgical History:  Procedure Laterality Date  . BUNIONECTOMY  20 yrs ago   bil feet  . BUNIONECTOMY  11/30/2011   Procedure: Lillard Anes;  Surgeon: Magnus Sinning, MD;  Location: [facility];  Service:  Orthopedics;  Laterality: Bilateral;  RIGHT FOOT EXCISION OF BUNIONETTE AND PARTIAL   PROXIMAL PHALANGECTOMY OF 5TH TOE LEFT FOOT FUNK BUNIONECTOMY,EXCISION OF BUNIONETTE   . COLONOSCOPY WITH PROPOFOL N/A 08/07/2012   Procedure: COLONOSCOPY WITH PROPOFOL;  Surgeon: Garlan Fair, MD;  Location: [facility];  Service: Endoscopy;  Laterality: N/A;  . MOUTH SURGERY     teeth extractions  . TONSILLECTOMY    . TUBAL LIGATION    . WRIST SURGERY      Current Medications: Current Meds  Medication Sig  . albuterol (PROVENTIL HFA;VENTOLIN HFA) 108 (90 Base) MCG/ACT inhaler Inhale 2 puffs into the lungs every 6 (six) hours as needed for wheezing or shortness of breath.  Marland Kitchen amLODipine-benazepril (LOTREL) 10-20 MG capsule Take 1 capsule by mouth daily.  . calcium-vitamin D (OSCAL WITH D) 500-200 MG-UNIT tablet Take 1 tablet by mouth.  Marland Kitchen rOPINIRole (REQUIP) 3 MG tablet Take 3 mg by mouth at bedtime.  . Vitamin D, Ergocalciferol, (DRISDOL) 1.25 MG (50000 UT) CAPS capsule Take 1 capsule (50,000 Units total) by mouth every 7 (seven) days.     Allergies:   Apricot flavor and Bee venom   Social History   Socioeconomic History  . Marital status: Widowed    Spouse name: Not on file  .  Number of children: Not on file  . Years of education: Not on file  . Highest education level: Not on file  Occupational History  . Occupation: Retired  Engineer, production  . Financial resource strain: Not on file  . Food insecurity    Worry: Not on file    Inability: Not on file  . Transportation needs    Medical: Not on file    Non-medical: Not on file  Tobacco Use  . Smoking status: Never Smoker  . Smokeless tobacco: Never Used  Substance and Sexual Activity  . Alcohol use: No  . Drug use: No  . Sexual activity: Not Currently  Lifestyle  . Physical activity    Days per week: Not on file    Minutes per session: Not on file  . Stress: Not on file  Relationships  . Social Musician on phone: Not  on file    Gets together: Not on file    Attends religious service: Not on file    Active member of club or organization: Not on file    Attends meetings of clubs or organizations: Not on file    Relationship status: Not on file  Other Topics Concern  . Not on file  Social History Narrative  . Not on file     Family History: The patient's family history includes Cancer in her father; Heart disease in her mother; Stroke in her mother.  ROS:   Please see the history of present illness.    No particular complaints.  All other systems reviewed and are negative.  EKGs/Labs/Other Studies Reviewed:    The following studies were reviewed today: 2D Doppler echocardiogram October 2020 IMPRESSIONS    1. Left ventricular ejection fraction, by visual estimation, is 60 to 65%. The left ventricle has normal function. Normal left ventricular size. There is mildly increased left ventricular hypertrophy.  2. Left ventricular diastolic Doppler parameters are consistent with impaired relaxation pattern of LV diastolic filling.  3. Moderate calcification of the mitral valve leaflet(s).  4. Moderate mitral annular calcification.  5. The mitral valve is degenerative. Trace mitral valve regurgitation. Mild-moderate mitral stenosis. Mean gradient 4 mmHg with MVA 1.5 cm^2 by PHT.  6. The tricuspid valve is normal in structure. Tricuspid valve regurgitation is mild.  7. The aortic valve is tricuspid Aortic valve regurgitation was not visualized by color flow Doppler. Moderate to severe aortic valve stenosis. Mean gradient 32 mmHg with calculated AVA 0.88 cm^2. Consider TEE or hemodynamic cath for further evaluation.  8. Global right ventricle has normal systolic function.The right ventricular size is normal. No increase in right ventricular wall thickness.  9. Right atrial size was normal. 10. Left atrial size was mildly dilated. 11. The inferior vena cava is normal in size with greater than 50%  respiratory variability, suggesting right atrial pressure of 3 mmHg. 12. Normal pulmonary artery systolic pressure. 13. The tricuspid regurgitant velocity is 2.57 m/s, and with an assumed right atrial pressure of 3 mmHg, the estimated right ventricular systolic pressure is normal at 29.4 mmHg. 14. Peak aortic valve velocity 344 cm/s.  Previous 218 cm/s in 2018.   EKG:  EKG not repeated.  October 29, 2018 EKG revealed normal sinus rhythm with nonspecific T wave abnormality.  Recent Labs: 03/22/2018: ALT 24; BUN 13; Creatinine, Ser 0.70; Potassium 4.5; Sodium 141  Recent Lipid Panel No results found for: CHOL, TRIG, HDL, CHOLHDL, VLDL, LDLCALC, LDLDIRECT  Physical Exam:    VS:  BP 126/72   Pulse 61   Ht 5' 7.5" (1.715 m)   Wt 205 lb 3.2 oz (93.1 kg)   SpO2 96%   BMI 31.66 kg/m     Wt Readings from Last 3 Encounters:  12/10/18 205 lb 3.2 oz (93.1 kg)  10/29/18 206 lb 14.4 oz (93.8 kg)  04/05/18 200 lb (90.7 kg)     GEN: Appearance is compatible with age.. No acute distress HEENT: Normal NECK: No JVD. LYMPHATICS: No lymphadenopathy CARDIAC: 3/6 left mid sternal and right upper sternal border crescendo decrescendo systolic murmur.  RRR without murmur, gallop, or edema. VASCULAR:  Normal Pulses. No bruits. RESPIRATORY:  Clear to auscultation without rales, wheezing or rhonchi  ABDOMEN: Soft, non-tender, non-distended, No pulsatile mass, MUSCULOSKELETAL: No deformity  SKIN: Warm and dry NEUROLOGIC:  Alert and oriented x 3 PSYCHIATRIC:  Normal affect   ASSESSMENT:    1. Aortic valve stenosis, etiology of cardiac valve disease unspecified   2. Essential hypertension   3. Type 2 diabetes mellitus without complication, without long-term current use of insulin ([facility])   4. Hyperlipidemia LDL goal <70   5. Chest pain of uncertain etiology   6. Educated about COVID-19 virus infection    PLAN:    In order of problems listed above:  1. Moderately severe aortic stenosis.   Currently asymptomatic.  Long discussion concerning follow-up and management strategy. 2. Excellent blood pressure control currently. 3. Hemoglobin A1c target is less than 7.  Current is 6.5. 4. LDL target less than 70. 5. She denies chest pain. 6. The 3W's are endorsed by the patient and being observed and practiced in her life.  Natural history of aortic valve stenosis was discussed in detail.  Cardinal symptoms of angina, syncope, and dyspnea were reviewed and significance relative to prognosis was described.  The importance of sequential imaging for disease monitoring was emphasized.  Work-up including possible heart catheterization and CT angiography were described as essential components of staging for therapy.  Treatment options, TAVR and SAVR, were discussed in some detail with emphasis on TAVR.    Medication Adjustments/Labs and Tests Ordered: Current medicines are reviewed at length with the patient today.  Concerns regarding medicines are outlined above.  Orders Placed This Encounter  Procedures  . ECHOCARDIOGRAM COMPLETE   No orders of the defined types were placed in this encounter.   Patient Instructions  Medication Instructions:  Your physician recommends that you continue on your current medications as directed. Please refer to the Current Medication list given to you today.  *If you need a refill on your cardiac medications before your next appointment, please call your pharmacy*  Lab Work: None If you have labs (blood work) drawn today and your tests are completely normal, you will receive your results only by: Marland Kitchen. MyChart Message (if you have MyChart) OR . A paper copy in the mail If you have any lab test that is abnormal or we need to change your treatment, we will call you to review the results.  Testing/Procedures: Your physician has requested that you have an echocardiogram 1-2 weeks prior to seeing Dr. Katrinka BlazingSmith back in a year. Echocardiography is a painless test  that uses sound waves to create images of your heart. It provides your doctor with information about the size and shape of your heart and how well your heart's chambers and valves are working. This procedure takes approximately one hour. There are no restrictions for this procedure.    Follow-Up: At [facility],  you and your health needs are our priority.  As part of our continuing mission to provide you with exceptional heart care, we have created designated Provider Care Teams.  These Care Teams include your primary Cardiologist (physician) and Advanced Practice Providers (APPs -  Physician Assistants and Nurse Practitioners) who all work together to provide you with the care you need, when you need it.  Your next appointment:   12 months  The format for your next appointment:   In Person  Provider:   You may see Lesleigh Noe, MD or one of the following Advanced Practice Providers on your designated Care Team:    Norma Fredrickson, NP  Nada Boozer, NP  Georgie Chard, NP   Other Instructions      Signed, Lesleigh Noe, MD  12/10/2018 10:25 AM    [facility]

## 2018-12-10 ENCOUNTER — Ambulatory Visit (INDEPENDENT_AMBULATORY_CARE_PROVIDER_SITE_OTHER): Payer: Medicare Other | Admitting: Interventional Cardiology

## 2018-12-10 ENCOUNTER — Encounter: Payer: Self-pay | Admitting: Interventional Cardiology

## 2018-12-10 ENCOUNTER — Other Ambulatory Visit: Payer: Self-pay

## 2018-12-10 VITALS — BP 126/72 | HR 61 | Ht 67.5 in | Wt 205.2 lb

## 2018-12-10 DIAGNOSIS — I10 Essential (primary) hypertension: Secondary | ICD-10-CM

## 2018-12-10 DIAGNOSIS — E785 Hyperlipidemia, unspecified: Secondary | ICD-10-CM | POA: Diagnosis not present

## 2018-12-10 DIAGNOSIS — E119 Type 2 diabetes mellitus without complications: Secondary | ICD-10-CM | POA: Diagnosis not present

## 2018-12-10 DIAGNOSIS — R079 Chest pain, unspecified: Secondary | ICD-10-CM | POA: Diagnosis not present

## 2018-12-10 DIAGNOSIS — Z7189 Other specified counseling: Secondary | ICD-10-CM

## 2018-12-10 DIAGNOSIS — I350 Nonrheumatic aortic (valve) stenosis: Secondary | ICD-10-CM

## 2018-12-10 NOTE — Patient Instructions (Signed)
Medication Instructions:  Your physician recommends that you continue on your current medications as directed. Please refer to the Current Medication list given to you today.  *If you need a refill on your cardiac medications before your next appointment, please call your pharmacy*  Lab Work: None If you have labs (blood work) drawn today and your tests are completely normal, you will receive your results only by: . MyChart Message (if you have MyChart) OR . A paper copy in the mail If you have any lab test that is abnormal or we need to change your treatment, we will call you to review the results.  Testing/Procedures: Your physician has requested that you have an echocardiogram 1-2 week sprior to seeing Dr. Smith back in a year. Echocardiography is a painless test that uses sound waves to create images of your heart. It provides your doctor with information about the size and shape of your heart and how well your heart's chambers and valves are working. This procedure takes approximately one hour. There are no restrictions for this procedure.    Follow-Up: At [facility], you and your health needs are our priority.  As part of our continuing mission to provide you with exceptional heart care, we have created designated Provider Care Teams.  These Care Teams include your primary Cardiologist (physician) and Advanced Practice Providers (APPs -  Physician Assistants and Nurse Practitioners) who all work together to provide you with the care you need, when you need it.  Your next appointment:   12 month(s)  The format for your next appointment:   In Person  Provider:   You may see Henry W Smith III, MD or one of the following Advanced Practice Providers on your designated Care Team:    Lori Gerhardt, NP  Laura Ingold, NP  Jill McDaniel, NP   Other Instructions   

## 2018-12-26 DIAGNOSIS — F329 Major depressive disorder, single episode, unspecified: Secondary | ICD-10-CM | POA: Diagnosis not present

## 2018-12-26 DIAGNOSIS — J45909 Unspecified asthma, uncomplicated: Secondary | ICD-10-CM | POA: Diagnosis not present

## 2018-12-26 DIAGNOSIS — F325 Major depressive disorder, single episode, in full remission: Secondary | ICD-10-CM | POA: Diagnosis not present

## 2018-12-26 DIAGNOSIS — E782 Mixed hyperlipidemia: Secondary | ICD-10-CM | POA: Diagnosis not present

## 2018-12-26 DIAGNOSIS — D649 Anemia, unspecified: Secondary | ICD-10-CM | POA: Diagnosis not present

## 2018-12-26 DIAGNOSIS — M1990 Unspecified osteoarthritis, unspecified site: Secondary | ICD-10-CM | POA: Diagnosis not present

## 2018-12-26 DIAGNOSIS — I10 Essential (primary) hypertension: Secondary | ICD-10-CM | POA: Diagnosis not present

## 2018-12-26 DIAGNOSIS — E1169 Type 2 diabetes mellitus with other specified complication: Secondary | ICD-10-CM | POA: Diagnosis not present

## 2019-01-03 DIAGNOSIS — H9201 Otalgia, right ear: Secondary | ICD-10-CM | POA: Diagnosis not present

## 2019-01-03 DIAGNOSIS — R42 Dizziness and giddiness: Secondary | ICD-10-CM | POA: Diagnosis not present

## 2019-02-18 NOTE — Telephone Encounter (Signed)
ERROR

## 2019-04-18 ENCOUNTER — Ambulatory Visit: Payer: Medicare Other

## 2019-04-25 ENCOUNTER — Ambulatory Visit: Payer: Medicare Other | Attending: Internal Medicine

## 2019-04-25 DIAGNOSIS — Z23 Encounter for immunization: Secondary | ICD-10-CM

## 2019-04-25 NOTE — Progress Notes (Signed)
   Covid-19 Vaccination Clinic  Name:  Briana Foster    MRN: [id number] DOB: Dec 07, 1945  04/25/2019  [name] was observed post Covid-19 immunization for 15 minutes without incident. She was provided with Vaccine Information Sheet and instruction to access the V-Safe system.   [name] was instructed to call 911 with any severe reactions post vaccine: Marland Kitchen Difficulty breathing  . Swelling of face and throat  . A fast heartbeat  . A bad rash all over body  . Dizziness and weakness   Immunizations Administered    Name Date Dose VIS Date Route   Pfizer COVID-19 Vaccine 04/25/2019  9:04 AM 0.3 mL 01/11/2019 Intramuscular   Manufacturer: [employer], [employer]   Lot: [id number]   NDC: [address]-6

## 2019-05-20 ENCOUNTER — Ambulatory Visit: Payer: Medicare Other | Attending: Internal Medicine

## 2019-05-20 DIAGNOSIS — Z23 Encounter for immunization: Secondary | ICD-10-CM

## 2019-05-20 NOTE — Progress Notes (Signed)
   Covid-19 Vaccination Clinic  Name:  DEZARAY SHIBUYA    MRN: [id number] DOB: 1945-02-20  05/20/2019  Ms. Longanecker was observed post Covid-19 immunization for 15 minutes without incident. She was provided with Vaccine Information Sheet and instruction to access the V-Safe system.   Ms. Yackel was instructed to call 911 with any severe reactions post vaccine: Marland Kitchen Difficulty breathing  . Swelling of face and throat  . A fast heartbeat  . A bad rash all over body  . Dizziness and weakness   Immunizations Administered    Name Date Dose VIS Date Route   Pfizer COVID-19 Vaccine 05/20/2019  8:27 AM 0.3 mL 03/27/2018 Intramuscular   Manufacturer: [employer], [employer]   Lot: [id number]   NDC: [address]-9

## 2019-09-18 ENCOUNTER — Telehealth: Payer: Self-pay | Admitting: Interventional Cardiology

## 2019-09-18 NOTE — Telephone Encounter (Signed)
Transferred call to Jennifer.

## 2019-09-18 NOTE — Telephone Encounter (Signed)
Dr. Venita Sheffield office called in regards to this mutual patient. She has new onset of afib and Dr. Donette Larry would like this patient seen this week. I do no see any openings except for today but the patient stated that she could not make it today. Please advise if there are any other options.

## 2019-09-18 NOTE — Telephone Encounter (Signed)
Spoke with pt and scheduled her to come in and see Dr.Smith tomorrow at 11AM.

## 2019-09-18 NOTE — Telephone Encounter (Signed)
Left message for Joni Reining at Dr. Paulita Fujita office to call back and let us know if they started the pt on a blood thinner or not. Also requested office note and EKG be sent over as well.

## 2019-09-18 NOTE — Telephone Encounter (Signed)
Left message to call back  

## 2019-09-18 NOTE — Telephone Encounter (Signed)
Spoke with someone from Dr. Paulita Fujita office.  Pt was given samples of Xarelto 15mg  and advised to take one daily and get in to be seen in our office.

## 2019-09-18 NOTE — Progress Notes (Signed)
Cardiology Office Note:    Date:  09/19/2019   ID:  Briana Foster, DOB 01-05-1946, MRN [id number]  PCP:  Georgann Housekeeper, MD  Cardiologist:  Lesleigh Noe, MD   Referring MD: Georgann Housekeeper, MD   Chief Complaint  Patient presents with  . Atrial Fibrillation    New onset    History of Present Illness:    Briana Foster is a 74 y.o. female with a hx of hypertension, DM 2, cardiomegaly, chest pain evaluation (no recurrence) , and moderate aortic stenosis (Moderate to severe 10/20200). Referred by Dr. Donette Larry for new onset AF.  Overall, the patient has no complaints.  On routine 70-month office visit with Dr. Donette Larry 09/18/2019, she was noted to have an elevated heart rate and EKG demonstrated atrial fibrillation with rapid ventricular response.  She has no awareness of tachycardia, is not short of breath, denies chest discomfort, is not having swelling or orthopnea.  Dr. Donette Larry recommended Toprol-XL 50 mg/day and Xarelto 15 mg/day.  He asked that we see her expediently.  She is being worked in within 24 hours to assist with care.  There is no prior history of atrial fibrillation.  A prior office visit for atypical chest pain was performed in 2020.  A November 2020 visit was performed to follow-up on moderate to severe aortic stenosis.  He denies angina, dyspnea, and syncope.  CHA2DS2/VAS Stroke Risk Points = 4  >= 2 Points: High Risk  1 - 1.99 Points: Medium Risk  0 Points: Low Risk    No Change      Details    This score determines the patient's risk of having a stroke if the  patient has atrial fibrillation.       Points Metrics  0 Has Congestive Heart Failure:  No    Current as of a minute ago  0 Has Vascular Disease:  No    Current as of a minute ago  1 Has Hypertension:  Yes    Current as of a minute ago  1 Age:  26    Current as of a minute ago  1 Has Diabetes:  Yes    Current as of a minute ago  0 Had Stroke:  No  Had TIA:  No  Had thromboembolism:  No     Current as of a minute ago  1 Female:  Yes    Current as of a minute ago     Past Medical History:  Diagnosis Date  . Anemia   . Arthritis 07-20-12   hands  . Asthma   . Back pain   . Cardiomegaly   . Class 1 obesity with serious comorbidity and body mass index (BMI) of 31.0 to 31.9 in adult 03/26/2018  . Depression   . Diabetes (HCC)   . Essential hypertension 03/26/2018  . GERD (gastroesophageal reflux disease)    "comes and goes" - no meds currently  . Heart murmur    told as teenager  . High blood pressure   . History of hypertension 07-20-12   pt stopped BP med several months ago because BP has been normal-MD is aware  . Hyperlipidemia   . Joint pain   . Restless leg syndrome   . Rheumatoid arthritis (HCC)   . Shortness of breath   . Tiredness   . Type 2 diabetes mellitus without complication, without long-term current use of insulin (HCC) 04/10/2018  . Vitamin D deficiency     Past Surgical History:  Procedure Laterality Date  . BUNIONECTOMY  20 yrs ago   bil feet  . BUNIONECTOMY  11/30/2011   Procedure: Arbutus Leas;  Surgeon: Drucilla Schmidt, MD;  Location: [facility];  Service: Orthopedics;  Laterality: Bilateral;  RIGHT FOOT EXCISION OF BUNIONETTE AND PARTIAL   PROXIMAL PHALANGECTOMY OF 5TH TOE LEFT FOOT FUNK BUNIONECTOMY,EXCISION OF BUNIONETTE   . COLONOSCOPY WITH PROPOFOL N/A 08/07/2012   Procedure: COLONOSCOPY WITH PROPOFOL;  Surgeon: Charolett Bumpers, MD;  Location: [facility];  Service: Endoscopy;  Laterality: N/A;  . MOUTH SURGERY     teeth extractions  . TONSILLECTOMY    . TUBAL LIGATION    . WRIST SURGERY      Current Medications: Current Meds  Medication Sig  . albuterol (PROVENTIL HFA;VENTOLIN HFA) 108 (90 Base) MCG/ACT inhaler Inhale 2 puffs into the lungs every 6 (six) hours as needed for wheezing or shortness of breath.  Marland Kitchen amLODipine-benazepril (LOTREL) 10-20 MG capsule Take 1 capsule by mouth daily.  . calcium-vitamin D (OSCAL WITH D) 500-200  MG-UNIT tablet Take 1 tablet by mouth.  . DULoxetine (CYMBALTA) 30 MG capsule Take 30 mg by mouth daily.  . metFORMIN (GLUCOPHAGE) 500 MG tablet Take 500 mg by mouth every morning.  . methocarbamol (ROBAXIN) 500 MG tablet Take 500 mg by mouth 3 (three) times daily as needed.  . pantoprazole (PROTONIX) 40 MG tablet Take 40 mg by mouth daily.  Marland Kitchen rOPINIRole (REQUIP) 3 MG tablet Take 3 mg by mouth at bedtime.  . rosuvastatin (CRESTOR) 20 MG tablet Take 20 mg by mouth daily.  . Vitamin D, Ergocalciferol, (DRISDOL) 1.25 MG (50000 UT) CAPS capsule Take 1 capsule (50,000 Units total) by mouth every 7 (seven) days.  . [DISCONTINUED] metoprolol succinate (TOPROL-XL) 50 MG 24 hr tablet Take 50 mg by mouth daily.  . [DISCONTINUED] Rivaroxaban (XARELTO) 15 MG TABS tablet Take 15 mg by mouth daily with supper.     Allergies:   Apricot flavor and Bee venom   Social History   Socioeconomic History  . Marital status: Widowed    Spouse name: Not on file  . Number of children: Not on file  . Years of education: Not on file  . Highest education level: Not on file  Occupational History  . Occupation: Retired  Tobacco Use  . Smoking status: Never Smoker  . Smokeless tobacco: Never Used  Substance and Sexual Activity  . Alcohol use: No  . Drug use: No  . Sexual activity: Not Currently  Other Topics Concern  . Not on file  Social History Narrative  . Not on file   Social Determinants of Health   Financial Resource Strain:   . Difficulty of Paying Living Expenses: Not on file  Food Insecurity:   . Worried About Programme researcher, broadcasting/film/video in the Last Year: Not on file  . Ran Out of Food in the Last Year: Not on file  Transportation Needs:   . Lack of Transportation (Medical): Not on file  . Lack of Transportation (Non-Medical): Not on file  Physical Activity:   . Days of Exercise per Week: Not on file  . Minutes of Exercise per Session: Not on file  Stress:   . Feeling of Stress : Not on file    Social Connections:   . Frequency of Communication with Friends and Family: Not on file  . Frequency of Social Gatherings with Friends and Family: Not on file  . Attends Religious Services: Not on file  . Active  Member of Clubs or Organizations: Not on file  . Attends Banker Meetings: Not on file  . Marital Status: Not on file     Family History: The patient's family history includes Cancer in her father; Heart disease in her mother; Stroke in her mother.  ROS:   Please see the history of present illness.    Under a lot of stress at home.  Denies alcohol use.  Does not smoke.  Sleeps greater than 6 hours/day.  Has a lot of energy.  No excessive daytime sleepiness.  No history of snoring.  She has osteoarthritis, restless leg syndrome, diverticulosis, DJD, diabetes mellitus type 2, and hypertension.  All other systems reviewed and are negative.  EKGs/Labs/Other Studies Reviewed:    The following studies were reviewed today:  2D Doppler echocardiogram performed on 11/06/2018:  IMPRESSIONS  1. Left ventricular ejection fraction, by visual estimation, is 60 to  65%. The left ventricle has normal function. Normal left ventricular size.  There is mildly increased left ventricular hypertrophy.  2. Left ventricular diastolic Doppler parameters are consistent with  impaired relaxation pattern of LV diastolic filling.  3. Moderate calcification of the mitral valve leaflet(s).  4. Moderate mitral annular calcification.  5. The mitral valve is degenerative. Trace mitral valve regurgitation.  Mild-moderate mitral stenosis. Mean gradient 4 mmHg with MVA 1.5 cm^2 by  PHT.  6. The tricuspid valve is normal in structure. Tricuspid valve  regurgitation is mild.  7. The aortic valve is tricuspid Aortic valve regurgitation was not  visualized by color flow Doppler. Moderate to severe aortic valve  stenosis. Mean gradient 32 mmHg with calculated AVA 0.88 cm^2. Consider  TEE or  hemodynamic cath for further evaluation.  8. Global right ventricle has normal systolic function.The right  ventricular size is normal. No increase in right ventricular wall  thickness.  9. Right atrial size was normal.  10. Left atrial size was mildly dilated.  11. The inferior vena cava is normal in size with greater than 50%  respiratory variability, suggesting right atrial pressure of 3 mmHg.  12. Normal pulmonary artery systolic pressure.  13. The tricuspid regurgitant velocity is 2.57 m/s, and with an assumed  right atrial pressure of 3 mmHg, the estimated right ventricular systolic  pressure is normal at 29.4 mmHg.   EKG:  EKG performed at [facility] 09/18/2019 atrial fibrillation with rapid ventricular response at 134 bpm.  Leftward axis.  Compared to the prior tracing.  When compared to September 2020, the new finding is atrial fib with rapid rate.  Recent Labs: No results found for requested labs within last 8760 hours.  Recent Lipid Panel No results found for: CHOL, TRIG, HDL, CHOLHDL, VLDL, LDLCALC, LDLDIRECT  Physical Exam:    VS:  BP 122/72   Pulse (!) 103   Ht 5' 7.5" (1.715 m)   Wt 202 lb 12.8 oz (92 kg)   SpO2 97%   BMI 31.29 kg/m     Wt Readings from Last 3 Encounters:  09/19/19 202 lb 12.8 oz (92 kg)  12/10/18 205 lb 3.2 oz (93.1 kg)  10/29/18 206 lb 14.4 oz (93.8 kg)     GEN: Not overweight. No acute distress HEENT: Normal NECK: No JVD. LYMPHATICS: No lymphadenopathy CARDIAC: Rapid irregularly irregular RR with 2/6 to 3/6 systolic murmur but n gallop, or edema. VASCULAR:  Normal Pulses. No bruits. RESPIRATORY:  Clear to auscultation without rales, wheezing or rhonchi  ABDOMEN: Soft, non-tender, non-distended, No pulsatile mass, MUSCULOSKELETAL: No  deformity  SKIN: Warm and dry NEUROLOGIC:  Alert and oriented x 3 PSYCHIATRIC:  Normal affect   ASSESSMENT:    1. New onset atrial fibrillation (HCC)   2. Aortic valve stenosis, etiology of cardiac valve  disease unspecified   3. Essential hypertension   4. Type 2 diabetes mellitus without complication, without long-term current use of insulin (HCC)   5. Hyperlipidemia LDL goal <70   6. Educated about COVID-19 virus infection    PLAN:    In order of problems listed above:  1. Duration is unknown.  Last demonstrated sinus rhythm was September 2020.  We will do continuous monitor for 2 weeks to determine if A. fib is permanent.  Patient has asthma and takes bronchodilators, therefore will not use Toprol-XL but rather diltiazem 180 mg/day and uptitrate for rate control as needed and tolerated by blood pressure.  Prefer Eliquis 5 mg twice daily because of lower bleeding risk.  Previously prescribed Xarelto 15 mg/day which would be subtherapeutic for age and weight.  Discussed with the patient the clinical entity of atrial fibrillation, natural history, and risk of embolic CVA, LV systolic dysfunction if tachycardia is allowed to persist, and options of rate versus rhythm control.  We will make a final decision about rhythm versus rate control after repeat echo was performed.  The most recent prior echo was done in October 2020 and demonstrated normal systolic function.  Echocardiography in October demonstrated moderate to severe AS and type II diastolic dysfunction placing her at increased risk for development of acute diastolic heart failure with poor rate control.  There is no current evidence for heart failure. 2. No symptoms of aortic stenosis.  Potentially, the hemodynamic stress imposed by AS could have a role in development of atrial fib.  Given the new atrial fibrillation, repeat echocardiography slightly before the yearly follow-up study is warranted. 3. Blood pressure is not significantly impaired. 4. Target hemoglobin A1c should be less than 7. 5. Target LDL should be less than 70. 6. She is vaccinated, practicing medication, and looking forward to a boost.  Plan to see the patient again in 4  weeks after the continuous monitor and repeat echo.  She will call if shortness of breath or cardiopulmonary symptoms.   Medication Adjustments/Labs and Tests Ordered: Current medicines are reviewed at length with the patient today.  Concerns regarding medicines are outlined above.  Orders Placed This Encounter  Procedures  . LONG TERM MONITOR (3-14 DAYS)  . ECHOCARDIOGRAM COMPLETE   Meds ordered this encounter  Medications  . diltiazem (CARDIZEM CD) 180 MG 24 hr capsule    Sig: Take 1 capsule (180 mg total) by mouth daily.    Dispense:  90 capsule    Refill:  3    D/c Metoprolol  . apixaban (ELIQUIS) 5 MG TABS tablet    Sig: Take 1 tablet (5 mg total) by mouth 2 (two) times daily.    Dispense:  60 tablet    Refill:  11    D/c Xarelto    Patient Instructions  Medication Instructions:  1) DISCONTINUE Xarelto 2) DISCONTINUE Metoprolol  3) START Eliquis  twice daily 4) START Diltiazem  once daily  *If you need a refill on your cardiac medications before your next appointment, please call your pharmacy*   Lab Work: None  If you have labs (blood work) drawn today and your tests are completely normal, you will receive your results only by: Marland Kitchen MyChart Message (if you have MyChart) OR .  A paper copy in the mail If you have any lab test that is abnormal or we need to change your treatment, we will call you to review the results.   Testing/Procedures: Your physician has requested that you have an echocardiogram. Echocardiography is a painless test that uses sound waves to create images of your heart. It provides your doctor with information about the size and shape of your heart and how well your heart's chambers and valves are working. This procedure takes approximately one hour. There are no restrictions for this procedure.  Your physician recommends that you wear a monitor for 2 weeks.   Follow-Up: At [facility], you and your health needs are our priority.  As  part of our continuing mission to provide you with exceptional heart care, we have created designated Provider Care Teams.  These Care Teams include your primary Cardiologist (physician) and Advanced Practice Providers (APPs -  Physician Assistants and Nurse Practitioners) who all work together to provide you with the care you need, when you need it.  We recommend signing up for the patient portal called "MyChart".  Sign up information is provided on this After Visit Summary.  MyChart is used to connect with patients for Virtual Visits (Telemedicine).  Patients are able to view lab/test results, encounter notes, upcoming appointments, etc.  Non-urgent messages can be sent to your provider as well.   To learn more about what you can do with MyChart, go to [email or web address].    Your next appointment:   1 month(s)- can have 10:20AM on 9/23 or 9/24  The format for your next appointment:   In Person  Provider:   You may see Lesleigh Noe, MD or one of the following Advanced Practice Providers on your designated Care Team:    Norma Fredrickson, NP  Nada Boozer, NP  Georgie Chard, NP    Other Instructions      Signed, Lesleigh Noe, MD  09/19/2019 12:05 PM    [facility]

## 2019-09-19 ENCOUNTER — Ambulatory Visit: Payer: Medicare Other | Admitting: Interventional Cardiology

## 2019-09-19 ENCOUNTER — Other Ambulatory Visit: Payer: Self-pay

## 2019-09-19 ENCOUNTER — Encounter: Payer: Self-pay | Admitting: Interventional Cardiology

## 2019-09-19 VITALS — BP 122/72 | HR 103 | Ht 67.5 in | Wt 202.8 lb

## 2019-09-19 DIAGNOSIS — I4891 Unspecified atrial fibrillation: Secondary | ICD-10-CM

## 2019-09-19 DIAGNOSIS — Z7189 Other specified counseling: Secondary | ICD-10-CM

## 2019-09-19 DIAGNOSIS — E119 Type 2 diabetes mellitus without complications: Secondary | ICD-10-CM

## 2019-09-19 DIAGNOSIS — I10 Essential (primary) hypertension: Secondary | ICD-10-CM | POA: Diagnosis not present

## 2019-09-19 DIAGNOSIS — E785 Hyperlipidemia, unspecified: Secondary | ICD-10-CM

## 2019-09-19 DIAGNOSIS — I350 Nonrheumatic aortic (valve) stenosis: Secondary | ICD-10-CM | POA: Diagnosis not present

## 2019-09-19 MED ORDER — DILTIAZEM HCL ER COATED BEADS 180 MG PO CP24: 180.0000 mg | ORAL_CAPSULE | Freq: Every day | ORAL | 3 refills | Status: DC

## 2019-09-19 MED ORDER — APIXABAN 5 MG PO TABS: 5.0000 mg | ORAL_TABLET | Freq: Two times a day (BID) | ORAL | 11 refills | Status: DC

## 2019-09-19 NOTE — Patient Instructions (Addendum)
Medication Instructions:  1) DISCONTINUE Xarelto 2) DISCONTINUE Metoprolol  3) START Eliquis 5mg  twice daily 4) START Diltiazem 180mg  once daily  *If you need a refill on your cardiac medications before your next appointment, please call your pharmacy*   Lab Work: None  If you have labs (blood work) drawn today and your tests are completely normal, you will receive your results only by: MyChart Message (if you have MyChart) OR . A paper copy in the mail If you have any lab test that is abnormal or we need to change your treatment, we will call you to review the results.   Testing/Procedures: Your physician has requested that you have an echocardiogram. Echocardiography is a painless test that uses sound waves to create images of your heart. It provides your doctor with information about the size and shape of your heart and how well your heart's chambers and valves are working. This procedure takes approximately one hour. There are no restrictions for this procedure.  Your physician recommends that you wear a monitor for 2 weeks.   Follow-Up: At [facility], you and your health needs are our priority.  As part of our continuing mission to provide you with exceptional heart care, we have created designated Provider Care Teams.  These Care Teams include your primary Cardiologist (physician) and Advanced Practice Providers (APPs -  Physician Assistants and Nurse Practitioners) who all work together to provide you with the care you need, when you need it.  We recommend signing up for the patient portal called "MyChart".  Sign up information is provided on this After Visit Summary.  MyChart is used to connect with patients for Virtual Visits (Telemedicine).  Patients are able to view lab/test results, encounter notes, upcoming appointments, etc.  Non-urgent messages can be sent to your provider as well.   To learn more about what you can do with MyChart, go to Marland Kitchen.     Your next appointment:   1 month(s)- can have 10:20AM on 9/23 or 9/24  The format for your next appointment:   In Person  Provider:   You may see 10/23, MD or one of the following Advanced Practice Providers on your designated Care Team:    10/24, NP  Lesleigh Noe, NP  Norma Fredrickson, NP    Other Instructions

## 2019-09-20 ENCOUNTER — Other Ambulatory Visit: Payer: Self-pay | Admitting: Internal Medicine

## 2019-09-20 DIAGNOSIS — Z1231 Encounter for screening mammogram for malignant neoplasm of breast: Secondary | ICD-10-CM

## 2019-09-20 DIAGNOSIS — E2839 Other primary ovarian failure: Secondary | ICD-10-CM

## 2019-10-02 ENCOUNTER — Other Ambulatory Visit: Payer: Self-pay

## 2019-10-02 ENCOUNTER — Ambulatory Visit (HOSPITAL_COMMUNITY): Payer: Medicare Other | Attending: Cardiovascular Disease

## 2019-10-02 DIAGNOSIS — I4891 Unspecified atrial fibrillation: Secondary | ICD-10-CM | POA: Insufficient documentation

## 2019-10-02 LAB — ECHOCARDIOGRAM COMPLETE
AR max vel: 0.92 cm2
AV Area VTI: 1.07 cm2
AV Area mean vel: 0.79 cm2
AV Mean grad: 19 mmHg
AV Peak grad: 27.7 mmHg
Ao pk vel: 2.63 m/s
Area-P 1/2: 4.45 cm2
MV M vel: 5.21 m/s
MV Peak grad: 108.6 mmHg
Radius: 0.5 cm
S' Lateral: 2.8 cm

## 2019-10-03 ENCOUNTER — Ambulatory Visit (INDEPENDENT_AMBULATORY_CARE_PROVIDER_SITE_OTHER): Payer: Medicare Other

## 2019-10-03 DIAGNOSIS — I4891 Unspecified atrial fibrillation: Secondary | ICD-10-CM | POA: Diagnosis not present

## 2019-10-04 ENCOUNTER — Telehealth: Payer: Self-pay | Admitting: Interventional Cardiology

## 2019-10-04 NOTE — Telephone Encounter (Signed)
Patient returning call for echo results. 

## 2019-10-04 NOTE — Telephone Encounter (Signed)
Spoke with pt and reviewed echo results.

## 2019-10-15 NOTE — Progress Notes (Signed)
Cardiology Office Note:    Date:  10/16/2019   ID:  Briana Foster, DOB Jun 22, 1945, MRN [id number]  PCP:  Briana Housekeeper, Foster  Cardiologist:  Briana Foster   Referring Foster: Briana Housekeeper, Foster   Chief Complaint  Patient presents with  . Atrial Fibrillation  . Cardiac Valve Problem    AS    History of Present Illness:    Briana Foster is a 74 y.o. female with a hx of hypertension, DM 2, cardiomegaly, chest pain evaluation(no recurrence) , and moderate aortic stenosis (Moderate to severe 10/20200), now being managed for new atrial fibrillation.  Having fatigue and dyspnea. This is new over past 2 weeks.  Started Eliquis 10/04/2019 .  Denies angina and syncope.  Past Medical History:  Diagnosis Date  . Anemia   . Arthritis 07-20-12   hands  . Asthma   . Back pain   . Cardiomegaly   . Class 1 obesity with serious comorbidity and body mass index (BMI) of 31.0 to 31.9 in adult 03/26/2018  . Depression   . Diabetes (HCC)   . Essential hypertension 03/26/2018  . GERD (gastroesophageal reflux disease)    "comes and goes" - no meds currently  . Heart murmur    told as teenager  . High blood pressure   . History of hypertension 07-20-12   pt stopped BP med several months ago because BP has been normal-Foster is aware  . Hyperlipidemia   . Joint pain   . Restless leg syndrome   . Rheumatoid arthritis (HCC)   . Shortness of breath   . Tiredness   . Type 2 diabetes mellitus without complication, without long-term current use of insulin (HCC) 04/10/2018  . Vitamin D deficiency     Past Surgical History:  Procedure Laterality Date  . Briana Foster  20 yrs ago   bil feet  . Briana Foster  11/30/2011   Procedure: Briana Foster;  Surgeon: Briana Foster;  Location: [facility];  Service: Orthopedics;  Laterality: Bilateral;  RIGHT FOOT EXCISION OF BUNIONETTE AND PARTIAL   PROXIMAL PHALANGECTOMY OF 5TH TOE LEFT FOOT FUNK Briana Foster,EXCISION OF BUNIONETTE   . COLONOSCOPY  WITH PROPOFOL N/A 08/07/2012   Procedure: COLONOSCOPY WITH PROPOFOL;  Surgeon: Briana Foster;  Location: [facility];  Service: Endoscopy;  Laterality: N/A;  . MOUTH SURGERY     teeth extractions  . TONSILLECTOMY    . TUBAL LIGATION    . WRIST SURGERY      Current Medications: Current Meds  Medication Sig  . albuterol (PROVENTIL HFA;VENTOLIN HFA) 108 (90 Base) MCG/ACT inhaler Inhale 2 puffs into the lungs every 6 (six) hours as needed for wheezing or shortness of breath.  Marland Kitchen amLODipine-benazepril (LOTREL) 10-20 MG capsule Take 1 capsule by mouth daily.  Marland Kitchen apixaban (ELIQUIS) 5 MG TABS tablet Take 1 tablet (5 mg total) by mouth 2 (two) times daily.  . calcium-vitamin D (OSCAL WITH D) 500-200 MG-UNIT tablet Take 1 tablet by mouth.  . diltiazem (CARDIZEM CD) 180 MG 24 hr capsule Take 1 capsule (180 mg total) by mouth daily.  . DULoxetine (CYMBALTA) 30 MG capsule Take 30 mg by mouth daily.  . metFORMIN (GLUCOPHAGE) 500 MG tablet Take 500 mg by mouth every morning.  . methocarbamol (ROBAXIN) 500 MG tablet Take 500 mg by mouth 3 (three) times daily as needed.  . pantoprazole (PROTONIX) 40 MG tablet Take 40 mg by mouth daily.  Marland Kitchen rOPINIRole (REQUIP) 3 MG tablet Take 3  mg by mouth at bedtime.  . rosuvastatin (CRESTOR) 20 MG tablet Take 20 mg by mouth daily.  . Vitamin D, Ergocalciferol, (DRISDOL) 1.25 MG (50000 UT) CAPS capsule Take 1 capsule (50,000 Units total) by mouth every 7 (seven) days.     Allergies:   Apricot flavor and Bee venom   Social History   Socioeconomic History  . Marital status: Widowed    Spouse name: Not on file  . Number of children: Not on file  . Years of education: Not on file  . Highest education level: Not on file  Occupational History  . Occupation: Retired  Tobacco Use  . Smoking status: Never Smoker  . Smokeless tobacco: Never Used  Substance and Sexual Activity  . Alcohol use: No  . Drug use: No  . Sexual activity: Not Currently  Other Topics  Concern  . Not on file  Social History Narrative  . Not on file   Social Determinants of Health   Financial Resource Strain:   . Difficulty of Paying Living Expenses: Not on file  Food Insecurity:   . Worried About Programme researcher, broadcasting/film/video in the Last Year: Not on file  . Ran Out of Food in the Last Year: Not on file  Transportation Needs:   . Lack of Transportation (Medical): Not on file  . Lack of Transportation (Non-Medical): Not on file  Physical Activity:   . Days of Exercise per Week: Not on file  . Minutes of Exercise per Session: Not on file  Stress:   . Feeling of Stress : Not on file  Social Connections:   . Frequency of Communication with Friends and Family: Not on file  . Frequency of Social Gatherings with Friends and Family: Not on file  . Attends Religious Services: Not on file  . Active Member of Clubs or Organizations: Not on file  . Attends Banker Meetings: Not on file  . Marital Status: Not on file     Family History: The patient's family history includes Cancer in her father; Heart disease in her mother; Stroke in her mother.  ROS:   Please see the history of present illness.    Overall doing well.  Asking for a permit to allow handicap parking.  She expands on this by stating there is increasing difficulty with walking from parking lots into the grocery store etc.  This has been occurring for 2 weeks.  All other systems reviewed and are negative.  EKGs/Labs/Other Studies Reviewed:    The following studies were reviewed today: No new data  2D Doppler echocardiogram September 2021: IMPRESSIONS    1. Patient appears to be in afib making estimation of EF more  challenging. Cannot r/o low flow severe AS but DVI 0.34 and AVA by VTI  1.07 cm2. Left ventricular ejection fraction, by estimation, is 45 to 50%.  The left ventricle has mildly decreased  function. The left ventricle demonstrates global hypokinesis. The left  ventricular internal  cavity size was mildly dilated. There is mild left  ventricular hypertrophy. Left ventricular diastolic parameters are  indeterminate.  2. Right ventricular systolic function is normal. The right ventricular  size is normal. There is normal pulmonary artery systolic pressure.  3. Left atrial size was mildly dilated.  4. The mitral valve is normal in structure. Mild to moderate mitral valve  regurgitation. No evidence of mitral stenosis.  5. The aortic valve was not well visualized. Aortic valve regurgitation  is not visualized. Moderate to  severe aortic valve stenosis.  6. The inferior vena cava is normal in size with greater than 50%  respiratory variability, suggesting right atrial pressure of 3 mmHg.   EKG:  EKG atrial fibrillation with rapid ventricular response at 118 bpm.  Recent Labs: No results found for requested labs within last 8760 hours.  Recent Lipid Panel No results found for: CHOL, TRIG, HDL, CHOLHDL, VLDL, LDLCALC, LDLDIRECT  Physical Exam:    VS:  BP 134/86   Pulse (!) 118   Ht  (1.702 m)   Wt 205 lb 9.6 oz (93.3 kg)   BMI 32.20 kg/m     Wt Readings from Last 3 Encounters:  10/16/19 205 lb 9.6 oz (93.3 kg)  09/19/19 202 lb 12.8 oz (92 kg)  12/10/18 205 lb 3.2 oz (93.1 kg)     GEN: Overweight. No acute distress HEENT: Normal NECK: No JVD. LYMPHATICS: No lymphadenopathy CARDIAC: Rapid irregularly irregular RR without murmur, gallop, or edema. VASCULAR:  Normal Pulses. No bruits. RESPIRATORY:  Clear to auscultation without rales, wheezing or rhonchi  ABDOMEN: Soft, non-tender, non-distended, No pulsatile mass, MUSCULOSKELETAL: No deformity  SKIN: Warm and dry NEUROLOGIC:  Alert and oriented x 3 PSYCHIATRIC:  Normal affect   ASSESSMENT:    1. Aortic valve stenosis, etiology of cardiac valve disease unspecified   2. New onset atrial fibrillation (HCC)   3. Essential hypertension   4. Hyperlipidemia LDL goal <70   5. Type 2 diabetes mellitus  without complication, without long-term current use of insulin (HCC)   6. Educated about COVID-19 virus infection    PLAN:    In order of problems listed above:  1. Aortic stenosis is moderate.  Recent echo confirms this but also demonstrated mild reduction in LVEF.  She is at risk for CHF with atrial fibrillation. 2. Start amiodarone 200 mg BID. 2 week f/u with ECG. Set up cardioversion for the next week if still in AF.  She is still ambulatory.  Amiodarone will help to further slow the heart rate.  She was consented for electrical cardioversion.   3. Blood pressure is currently excellent. 4. We did not discuss her lipid panel 5. We did not discuss diabetes 6. Exertional fatigue and dyspnea are related to acute on chronic combined systolic and diastolic heart failure.  She is cautioned that if exertional intolerance or dyspnea become limiting, she should call right away.    Atrial fibrillation management including discussion of rate versus rhythm control was discussed.  She is symptomatic and therefore rhythm control would be advantageous.  Electrical cardioversion including the risk of mechanical injury, stroke, asystole, and potential need for admission to the hospital was discussed in detail.  She was advised that major complications occurred less than 1% of the time.  She is consented to proceed if her heart remains out of rhythm by the next visit.   Medication Adjustments/Labs and Tests Ordered: Current medicines are reviewed at length with the patient today.  Concerns regarding medicines are outlined above.  Orders Placed This Encounter  Procedures  . EKG 12-Lead   Meds ordered this encounter  Medications  . amiodarone (PACERONE) 200 MG tablet    Sig: Take 1 tablet (200 mg total) by mouth 2 (two) times daily.    Dispense:  60 tablet    Refill:  5    Patient Instructions  Medication Instructions:  1) START Amiodarone  twice daily  *If you need a refill on your  cardiac medications before your next  appointment, please call your pharmacy*   Lab Work: None If you have labs (blood work) drawn today and your tests are completely normal, you will receive your results only by: Marland Kitchen MyChart Message (if you have MyChart) OR . A paper copy in the mail If you have any lab test that is abnormal or we need to change your treatment, we will call you to review the results.   Testing/Procedures: None   Follow-Up: At [facility], you and your health needs are our priority.  As part of our continuing mission to provide you with exceptional heart care, we have created designated Provider Care Teams.  These Care Teams include your primary Cardiologist (physician) and Advanced Practice Providers (APPs -  Physician Assistants and Nurse Practitioners) who all work together to provide you with the care you need, when you need it.  We recommend signing up for the patient portal called "MyChart".  Sign up information is provided on this After Visit Summary.  MyChart is used to connect with patients for Virtual Visits (Telemedicine).  Patients are able to view lab/test results, encounter notes, upcoming appointments, etc.  Non-urgent messages can be sent to your provider as well.   To learn more about what you can do with MyChart, go to [email or web address].    Your next appointment:   2 week(s)  The format for your next appointment:   In Person  Provider:   You may see Briana Foster or one of the following Advanced Practice Providers on your designated Care Team:    Norma Fredrickson, NP  Nada Boozer, NP  Georgie Chard, NP    Other Instructions      Signed, Briana Foster  10/16/2019 10:59 AM    [facility]

## 2019-10-16 ENCOUNTER — Other Ambulatory Visit: Payer: Self-pay

## 2019-10-16 ENCOUNTER — Encounter: Payer: Self-pay | Admitting: Interventional Cardiology

## 2019-10-16 ENCOUNTER — Ambulatory Visit: Payer: Medicare Other | Admitting: Interventional Cardiology

## 2019-10-16 VITALS — BP 134/86 | HR 118 | Ht 67.0 in | Wt 205.6 lb

## 2019-10-16 DIAGNOSIS — I350 Nonrheumatic aortic (valve) stenosis: Secondary | ICD-10-CM

## 2019-10-16 DIAGNOSIS — I10 Essential (primary) hypertension: Secondary | ICD-10-CM

## 2019-10-16 DIAGNOSIS — I4891 Unspecified atrial fibrillation: Secondary | ICD-10-CM | POA: Diagnosis not present

## 2019-10-16 DIAGNOSIS — E785 Hyperlipidemia, unspecified: Secondary | ICD-10-CM | POA: Diagnosis not present

## 2019-10-16 DIAGNOSIS — Z7189 Other specified counseling: Secondary | ICD-10-CM

## 2019-10-16 DIAGNOSIS — E119 Type 2 diabetes mellitus without complications: Secondary | ICD-10-CM

## 2019-10-16 MED ORDER — AMIODARONE HCL 200 MG PO TABS: 200.0000 mg | ORAL_TABLET | Freq: Two times a day (BID) | ORAL | 5 refills | Status: DC

## 2019-10-16 NOTE — Patient Instructions (Signed)
Medication Instructions:  1) START Amiodarone 200mg  twice daily  *If you need a refill on your cardiac medications before your next appointment, please call your pharmacy*   Lab Work: None If you have labs (blood work) drawn today and your tests are completely normal, you will receive your results only by:  MyChart Message (if you have MyChart) OR  A paper copy in the mail If you have any lab test that is abnormal or we need to change your treatment, we will call you to review the results.   Testing/Procedures: None   Follow-Up: At [facility], you and your health needs are our priority.  As part of our continuing mission to provide you with exceptional heart care, we have created designated Provider Care Teams.  These Care Teams include your primary Cardiologist (physician) and Advanced Practice Providers (APPs -  Physician Assistants and Nurse Practitioners) who all work together to provide you with the care you need, when you need it.  We recommend signing up for the patient portal called "MyChart".  Sign up information is provided on this After Visit Summary.  MyChart is used to connect with patients for Virtual Visits (Telemedicine).  Patients are able to view lab/test results, encounter notes, upcoming appointments, etc.  Non-urgent messages can be sent to your provider as well.   To learn more about what you can do with MyChart, go to [facility].    Your next appointment:   2 week(s)  The format for your next appointment:   In Person  Provider:   You may see [email or web address], MD or one of the following Advanced Practice Providers on your designated Care Team:    Lesleigh Noe, NP  Norma Fredrickson, NP  Nada Boozer, NP    Other Instructions

## 2019-10-22 ENCOUNTER — Ambulatory Visit: Payer: Medicare Other

## 2019-10-24 ENCOUNTER — Other Ambulatory Visit: Payer: Self-pay | Admitting: Interventional Cardiology

## 2019-10-24 ENCOUNTER — Ambulatory Visit: Payer: Medicare Other | Admitting: Interventional Cardiology

## 2019-10-24 MED ORDER — APIXABAN 5 MG PO TABS: 5.0000 mg | ORAL_TABLET | Freq: Two times a day (BID) | ORAL | 1 refills | Status: DC

## 2019-10-24 NOTE — Telephone Encounter (Signed)
Pt last saw Dr Katrinka Blazing 10/16/19, last labs 09/18/19 Creat 0.81 at [facility] per [facility], age 74, weight 93.3kg, based on specified criteria pt is on appropriate dosage of Eliquis 5mg  BID.  Will refill rx.

## 2019-10-24 NOTE — Telephone Encounter (Signed)
*  STAT* If patient is at the pharmacy, call can be transferred to refill team.   1. Which medications need to be refilled? (please list name of each medication and dose if known) apixaban (ELIQUIS) 5 MG TABS tablet  2. Which pharmacy/location (including street and city if local pharmacy) is medication to be sent to? [facility]  3. Do they need a 30 day or 90 day supply? 90    Taylor from [facility] states that patient has decided to go back to using [facility] and would like this updated in MyChart, thank you!

## 2019-10-26 NOTE — Progress Notes (Signed)
Cardiology Office Note:    Date:  10/31/2019   ID:  Briana Foster, DOB 05-22-1945, MRN [id number]  PCP:  Georgann Housekeeper, MD  Cardiologist:  Lesleigh Noe, MD   Referring MD: Georgann Housekeeper, MD   Chief Complaint  Patient presents with  . Atrial Fibrillation  . Cardiac Valve Problem    Moderate aortic stenosis    History of Present Illness:    Briana Foster is a 74 y.o. female with a hx of hypertension, DM 2, cardiomegaly, chest pain evaluation(no recurrence) , andmoderateaortic stenosis (Moderate to severe 10/202000), now being managed for new atrial fibrillation and loading amiodarone.  She feels better since starting amiodarone.  There is still some dyspnea on exertion.  No orthopnea.  No episodes of chest pain or syncope/near syncope.  She does have moderate to severe aortic stenosis and has been tolerating the hemodynamic/heart rate stress of poorly controlled ventricular response without developing heart failure.  After the documenting atrial fibrillation, we discussed outpatient electrical cardioversion including the benefits (improved energy, decreased risk of heart failure, and decreased risk of stroke) versus risk of failure, less than 10%, mechanical injury, stroke, respiratory failure, or more severe arrhythmias that require urgent management.  Past Medical History:  Diagnosis Date  . Anemia   . Arthritis 07-20-12   hands  . Asthma   . Back pain   . Cardiomegaly   . Class 1 obesity with serious comorbidity and body mass index (BMI) of 31.0 to 31.9 in adult 03/26/2018  . Depression   . Diabetes (HCC)   . Essential hypertension 03/26/2018  . GERD (gastroesophageal reflux disease)    "comes and goes" - no meds currently  . Heart murmur    told as teenager  . High blood pressure   . History of hypertension 07-20-12   pt stopped BP med several months ago because BP has been normal-MD is aware  . Hyperlipidemia   . Joint pain   . Restless leg syndrome   .  Rheumatoid arthritis (HCC)   . Shortness of breath   . Tiredness   . Type 2 diabetes mellitus without complication, without long-term current use of insulin (HCC) 04/10/2018  . Vitamin D deficiency     Past Surgical History:  Procedure Laterality Date  . BUNIONECTOMY  20 yrs ago   bil feet  . BUNIONECTOMY  11/30/2011   Procedure: Arbutus Leas;  Surgeon: Drucilla Schmidt, MD;  Location: [facility];  Service: Orthopedics;  Laterality: Bilateral;  RIGHT FOOT EXCISION OF BUNIONETTE AND PARTIAL   PROXIMAL PHALANGECTOMY OF 5TH TOE LEFT FOOT FUNK BUNIONECTOMY,EXCISION OF BUNIONETTE   . COLONOSCOPY WITH PROPOFOL N/A 08/07/2012   Procedure: COLONOSCOPY WITH PROPOFOL;  Surgeon: Charolett Bumpers, MD;  Location: [facility];  Service: Endoscopy;  Laterality: N/A;  . MOUTH SURGERY     teeth extractions  . TONSILLECTOMY    . TUBAL LIGATION    . WRIST SURGERY      Current Medications: Current Meds  Medication Sig  . albuterol (PROVENTIL HFA;VENTOLIN HFA) 108 (90 Base) MCG/ACT inhaler Inhale 2 puffs into the lungs every 6 (six) hours as needed for wheezing or shortness of breath.  Marland Kitchen amiodarone (PACERONE) 200 MG tablet Take 1 tablet (200 mg total) by mouth 2 (two) times daily.  Marland Kitchen amLODipine-benazepril (LOTREL) 10-20 MG capsule Take 1 capsule by mouth daily.  Marland Kitchen apixaban (ELIQUIS) 5 MG TABS tablet Take 1 tablet (5 mg total) by mouth 2 (two) times daily.  Marland Kitchen  calcium-vitamin D (OSCAL WITH D) 500-200 MG-UNIT tablet Take 1 tablet by mouth.  . diltiazem (CARDIZEM CD) 180 MG 24 hr capsule Take 1 capsule (180 mg total) by mouth daily.  . DULoxetine (CYMBALTA) 30 MG capsule Take 30 mg by mouth daily.  Marland Kitchen losartan (COZAAR) 50 MG tablet Take 50 mg by mouth every morning.  . metFORMIN (GLUCOPHAGE) 850 MG tablet Take 850 mg by mouth 2 (two) times daily.  . methocarbamol (ROBAXIN) 500 MG tablet Take 500 mg by mouth 3 (three) times daily as needed.  . pantoprazole (PROTONIX) 40 MG tablet Take 40 mg by mouth daily.  .  predniSONE (DELTASONE) 20 MG tablet Take 20 mg by mouth 2 (two) times daily.  Marland Kitchen rOPINIRole (REQUIP) 4 MG tablet Take 4 mg by mouth at bedtime.  . rosuvastatin (CRESTOR) 20 MG tablet Take 20 mg by mouth daily.  . Vitamin D, Ergocalciferol, (DRISDOL) 1.25 MG (50000 UT) CAPS capsule Take 1 capsule (50,000 Units total) by mouth every 7 (seven) days.  . [DISCONTINUED] rOPINIRole (REQUIP) 3 MG tablet Take 3 mg by mouth at bedtime.     Allergies:   Apricot flavor and Bee venom   Social History   Socioeconomic History  . Marital status: Widowed    Spouse name: Not on file  . Number of children: Not on file  . Years of education: Not on file  . Highest education level: Not on file  Occupational History  . Occupation: Retired  Tobacco Use  . Smoking status: Never Smoker  . Smokeless tobacco: Never Used  Substance and Sexual Activity  . Alcohol use: No  . Drug use: No  . Sexual activity: Not Currently  Other Topics Concern  . Not on file  Social History Narrative  . Not on file   Social Determinants of Health   Financial Resource Strain:   . Difficulty of Paying Living Expenses: Not on file  Food Insecurity:   . Worried About Programme researcher, broadcasting/film/video in the Last Year: Not on file  . Ran Out of Food in the Last Year: Not on file  Transportation Needs:   . Lack of Transportation (Medical): Not on file  . Lack of Transportation (Non-Medical): Not on file  Physical Activity:   . Days of Exercise per Week: Not on file  . Minutes of Exercise per Session: Not on file  Stress:   . Feeling of Stress : Not on file  Social Connections:   . Frequency of Communication with Friends and Family: Not on file  . Frequency of Social Gatherings with Friends and Family: Not on file  . Attends Religious Services: Not on file  . Active Member of Clubs or Organizations: Not on file  . Attends Banker Meetings: Not on file  . Marital Status: Not on file     Family History: The patient's  family history includes Cancer in her father; Heart disease in her mother; Stroke in her mother.  ROS:   Please see the history of present illness.    No blood in the urine or stool.  No neurological complaints.  She does not sleep well.  Insomnia predates atrial fibrillation.  She has not had lower extremity swelling, syncope, or angina.  All other systems reviewed and are negative.  EKGs/Labs/Other Studies Reviewed:    The following studies were reviewed today:  CONTINUOUS MONITOR 2021: Study Highlights    Continuous atrial fibrillation  Heart rate range 51-177 bpm with average heart rate 116  bpm.   Continuous atrial fibrillation with poor rate control.  2D Doppler Echocardiogram 2020: IMPRESSIONS    1. Patient appears to be in afib making estimation of EF more  challenging. Cannot r/o low flow severe AS but DVI 0.34 and AVA by VTI  1.07 cm2. Left ventricular ejection fraction, by estimation, is 45 to 50%.  The left ventricle has mildly decreased  function. The left ventricle demonstrates global hypokinesis. The left  ventricular internal cavity size was mildly dilated. There is mild left  ventricular hypertrophy. Left ventricular diastolic parameters are  indeterminate.  2. Right ventricular systolic function is normal. The right ventricular  size is normal. There is normal pulmonary artery systolic pressure.  3. Left atrial size was mildly dilated.  4. The mitral valve is normal in structure. Mild to moderate mitral valve  regurgitation. No evidence of mitral stenosis.  5. The aortic valve was not well visualized. Aortic valve regurgitation  is not visualized. Moderate to severe aortic valve stenosis.  6. The inferior vena cava is normal in size with greater than 50%  respiratory variability, suggesting right atrial pressure of 3 mmHg.   EKG:  EKG atrial fibrillation with rapid ventricular response at 126 bpm but asymptomatic.  Prominent voltage with no ST-T wave  abnormality.  Recent Labs: No results found for requested labs within last 8760 hours.  Recent Lipid Panel No results found for: CHOL, TRIG, HDL, CHOLHDL, VLDL, LDLCALC, LDLDIRECT  Physical Exam:    VS:  BP 110/78   Pulse (!) 126   Ht  (1.702 m)   Wt 203 lb (92.1 kg)   SpO2 96%   BMI 31.79 kg/m     Wt Readings from Last 3 Encounters:  10/31/19 203 lb (92.1 kg)  10/16/19 205 lb 9.6 oz (93.3 kg)  09/19/19 202 lb 12.8 oz (92 kg)     GEN: Compensated, appearing compatible with age in appearance. No acute distress HEENT: Normal NECK: No JVD. LYMPHATICS: No lymphadenopathy CARDIAC: Rapid irregularly irregular RR with 2/6 to 3/6 crescendo decrescendo systolic murmur, gallop, or edema. VASCULAR:  Normal Pulses. No bruits. RESPIRATORY:  Clear to auscultation without rales, wheezing or rhonchi  ABDOMEN: Soft, non-tender, non-distended, No pulsatile mass, MUSCULOSKELETAL: No deformity  SKIN: Warm and dry NEUROLOGIC:  Alert and oriented x 3 PSYCHIATRIC:  Normal affect   ASSESSMENT:    1. Aortic valve stenosis, etiology of cardiac valve disease unspecified   2. New onset atrial fibrillation (HCC)   3. Essential hypertension   4. Hyperlipidemia LDL goal <70   5. Type 2 diabetes mellitus without complication, without long-term current use of insulin (HCC)   6. Educated about COVID-19 virus infection    PLAN:    In order of problems listed above:  1. AFIB has caused acute diastolic HF.  Will convert atrial fibrillation to sinus rhythm and then consider proceeding with w/u for TAVR. 2. Will convert AF with electrical cardioversion within the coming week.  Risks and benefits have been discussed.  Has been on continuous anticoagulation therapy for greater than 4 weeks. Continue amiodarone 200 mg twice daily until she returns for office visit 2 weeks after cardioversion.  3. Discontinue diltiazem after cardioversion if sinus rhythm is successfully restored.\Continue Lotrel 10/20  mg daily.  May decide to use a to use a diuretic if needed.  Primary care started losartan recently but the patient has not taken that because it causes nausea.  She should not resume this medication. 4. Continue Crestor 20  mg/day 5. Continue Glucophage but can consider SGLT2 therapy. 6. Is vaccinated.  Practicing social mitigation.   Medication Adjustments/Labs and Tests Ordered: Current medicines are reviewed at length with the patient today.  Concerns regarding medicines are outlined above.  Orders Placed This Encounter  Procedures  . CBC  . Basic metabolic panel  . EKG 12-Lead   No orders of the defined types were placed in this encounter.   Patient Instructions  Medication Instructions:  Your physician recommends that you continue on your current medications as directed. Please refer to the Current Medication list given to you today.  *If you need a refill on your cardiac medications before your next appointment, please call your pharmacy*   Lab Work: BMET and CBC today  If you have labs (blood work) drawn today and your tests are completely normal, you will receive your results only by: Marland Kitchen MyChart Message (if you have MyChart) OR . A paper copy in the mail If you have any lab test that is abnormal or we need to change your treatment, we will call you to review the results.   Testing/Procedures: Your physician has recommended that you have a Cardioversion (DCCV). Electrical Cardioversion uses a jolt of electricity to your heart either through paddles or wired patches attached to your chest. This is a controlled, usually prescheduled, procedure. Defibrillation is done under light anesthesia in the hospital, and you usually go home the day of the procedure. This is done to get your heart back into a normal rhythm. You are not awake for the procedure. Please see the instruction sheet given to you today.    Follow-Up: At [facility], you and your health needs are our  priority.  As part of our continuing mission to provide you with exceptional heart care, we have created designated Provider Care Teams.  These Care Teams include your primary Cardiologist (physician) and Advanced Practice Providers (APPs -  Physician Assistants and Nurse Practitioners) who all work together to provide you with the care you need, when you need it.  We recommend signing up for the patient portal called "MyChart".  Sign up information is provided on this After Visit Summary.  MyChart is used to connect with patients for Virtual Visits (Telemedicine).  Patients are able to view lab/test results, encounter notes, upcoming appointments, etc.  Non-urgent messages can be sent to your provider as well.   To learn more about what you can do with MyChart, go to [email or web address].    Your next appointment:   2 week(s)  The format for your next appointment:   In Person  Provider:   You may see Lesleigh Noe, MD or one of the following Advanced Practice Providers on your designated Care Team:    Norma Fredrickson, NP  Nada Boozer, NP  Georgie Chard, NP    Other Instructions       Signed, Lesleigh Noe, MD  10/31/2019 10:25 AM    [facility]

## 2019-10-31 ENCOUNTER — Ambulatory Visit (INDEPENDENT_AMBULATORY_CARE_PROVIDER_SITE_OTHER): Payer: Medicare Other | Admitting: Interventional Cardiology

## 2019-10-31 ENCOUNTER — Other Ambulatory Visit: Payer: Self-pay

## 2019-10-31 ENCOUNTER — Encounter: Payer: Self-pay | Admitting: Interventional Cardiology

## 2019-10-31 ENCOUNTER — Encounter: Payer: Self-pay | Admitting: *Deleted

## 2019-10-31 VITALS — BP 110/78 | HR 126 | Ht 67.0 in | Wt 203.0 lb

## 2019-10-31 DIAGNOSIS — E119 Type 2 diabetes mellitus without complications: Secondary | ICD-10-CM

## 2019-10-31 DIAGNOSIS — I10 Essential (primary) hypertension: Secondary | ICD-10-CM | POA: Diagnosis not present

## 2019-10-31 DIAGNOSIS — I4891 Unspecified atrial fibrillation: Secondary | ICD-10-CM | POA: Diagnosis not present

## 2019-10-31 DIAGNOSIS — I350 Nonrheumatic aortic (valve) stenosis: Secondary | ICD-10-CM | POA: Diagnosis not present

## 2019-10-31 DIAGNOSIS — E785 Hyperlipidemia, unspecified: Secondary | ICD-10-CM

## 2019-10-31 DIAGNOSIS — Z7189 Other specified counseling: Secondary | ICD-10-CM

## 2019-10-31 LAB — BASIC METABOLIC PANEL
BUN/Creatinine Ratio: 19 (ref 12–28)
BUN: 16 mg/dL (ref 8–27)
CO2: 23 mmol/L (ref 20–29)
Calcium: 10.6 mg/dL — ABNORMAL HIGH (ref 8.7–10.3)
Chloride: 100 mmol/L (ref 96–106)
Creatinine, Ser: 0.86 mg/dL (ref 0.57–1.00)
GFR calc Af Amer: 79 mL/min/{1.73_m2} (ref >59)
GFR calc non Af Amer: 68 mL/min/{1.73_m2} (ref >59)
Glucose: 116 mg/dL — ABNORMAL HIGH (ref 65–99)
Potassium: 4.8 mmol/L (ref 3.5–5.2)
Sodium: 138 mmol/L (ref 134–144)

## 2019-10-31 LAB — CBC
Hematocrit: 43.3 % (ref 34.0–46.6)
Hemoglobin: 14.5 g/dL (ref 11.1–15.9)
MCH: 30.7 pg (ref 26.6–33.0)
MCHC: 33.5 g/dL (ref 31.5–35.7)
MCV: 92 fL (ref 79–97)
Platelets: 254 10*3/uL (ref 150–450)
RBC: 4.72 x10E6/uL (ref 3.77–5.28)
RDW: 13.6 % (ref 11.7–15.4)
WBC: 13.4 10*3/uL — ABNORMAL HIGH (ref 3.4–10.8)

## 2019-10-31 NOTE — Patient Instructions (Signed)
Medication Instructions:  Your physician recommends that you continue on your current medications as directed. Please refer to the Current Medication list given to you today.  *If you need a refill on your cardiac medications before your next appointment, please call your pharmacy*   Lab Work: BMET and CBC today  If you have labs (blood work) drawn today and your tests are completely normal, you will receive your results only by: Marland Kitchen MyChart Message (if you have MyChart) OR . A paper copy in the mail If you have any lab test that is abnormal or we need to change your treatment, we will call you to review the results.   Testing/Procedures: Your physician has recommended that you have a Cardioversion (DCCV). Electrical Cardioversion uses a jolt of electricity to your heart either through paddles or wired patches attached to your chest. This is a controlled, usually prescheduled, procedure. Defibrillation is done under light anesthesia in the hospital, and you usually go home the day of the procedure. This is done to get your heart back into a normal rhythm. You are not awake for the procedure. Please see the instruction sheet given to you today.    Follow-Up: At [facility], you and your health needs are our priority.  As part of our continuing mission to provide you with exceptional heart care, we have created designated Provider Care Teams.  These Care Teams include your primary Cardiologist (physician) and Advanced Practice Providers (APPs -  Physician Assistants and Nurse Practitioners) who all work together to provide you with the care you need, when you need it.  We recommend signing up for the patient portal called "MyChart".  Sign up information is provided on this After Visit Summary.  MyChart is used to connect with patients for Virtual Visits (Telemedicine).  Patients are able to view lab/test results, encounter notes, upcoming appointments, etc.  Non-urgent messages can be sent to  your provider as well.   To learn more about what you can do with MyChart, go to [email or web address].    Your next appointment:   2 week(s)  The format for your next appointment:   In Person  Provider:   You may see Lesleigh Noe, MD or one of the following Advanced Practice Providers on your designated Care Team:    Norma Fredrickson, NP  Nada Boozer, NP  Georgie Chard, NP    Other Instructions

## 2019-11-02 ENCOUNTER — Other Ambulatory Visit (HOSPITAL_COMMUNITY)
Admission: RE | Admit: 2019-11-02 | Discharge: 2019-11-02 | Disposition: A | Discharge status: Home / Self Care | Payer: Medicare Other | Source: Ambulatory Visit | Attending: Cardiology | Admitting: Cardiology

## 2019-11-02 DIAGNOSIS — Z01812 Encounter for preprocedural laboratory examination: Secondary | ICD-10-CM | POA: Insufficient documentation

## 2019-11-02 DIAGNOSIS — Z20822 Contact with and (suspected) exposure to covid-19: Secondary | ICD-10-CM | POA: Diagnosis not present

## 2019-11-02 LAB — SARS CORONAVIRUS 2 (TAT 6-24 HRS): SARS Coronavirus 2: NEGATIVE

## 2019-11-04 ENCOUNTER — Ambulatory Visit: Payer: Medicare Other

## 2019-11-05 ENCOUNTER — Ambulatory Visit (HOSPITAL_COMMUNITY): Payer: Medicare Other | Admitting: Anesthesiology

## 2019-11-05 ENCOUNTER — Other Ambulatory Visit: Payer: Self-pay

## 2019-11-05 ENCOUNTER — Ambulatory Visit (HOSPITAL_COMMUNITY)
Admission: RE | Admit: 2019-11-05 | Discharge: 2019-11-05 | Disposition: A | Discharge status: Home / Self Care | Payer: Medicare Other | Attending: Cardiology | Admitting: Cardiology

## 2019-11-05 ENCOUNTER — Other Ambulatory Visit: Payer: Self-pay | Admitting: Interventional Cardiology

## 2019-11-05 ENCOUNTER — Encounter (HOSPITAL_COMMUNITY): Payer: Self-pay | Admitting: Cardiology

## 2019-11-05 ENCOUNTER — Encounter (HOSPITAL_COMMUNITY)
Admission: RE | Disposition: A | Discharge status: Home / Self Care | Payer: Self-pay | Source: Home / Self Care | Attending: Cardiology

## 2019-11-05 DIAGNOSIS — M069 Rheumatoid arthritis, unspecified: Secondary | ICD-10-CM | POA: Insufficient documentation

## 2019-11-05 DIAGNOSIS — Z7984 Long term (current) use of oral hypoglycemic drugs: Secondary | ICD-10-CM | POA: Diagnosis not present

## 2019-11-05 DIAGNOSIS — Z79899 Other long term (current) drug therapy: Secondary | ICD-10-CM | POA: Insufficient documentation

## 2019-11-05 DIAGNOSIS — I4819 Other persistent atrial fibrillation: Secondary | ICD-10-CM | POA: Diagnosis not present

## 2019-11-05 DIAGNOSIS — J45909 Unspecified asthma, uncomplicated: Secondary | ICD-10-CM | POA: Diagnosis not present

## 2019-11-05 DIAGNOSIS — Z7901 Long term (current) use of anticoagulants: Secondary | ICD-10-CM | POA: Diagnosis not present

## 2019-11-05 DIAGNOSIS — E669 Obesity, unspecified: Secondary | ICD-10-CM | POA: Diagnosis not present

## 2019-11-05 DIAGNOSIS — E785 Hyperlipidemia, unspecified: Secondary | ICD-10-CM | POA: Insufficient documentation

## 2019-11-05 DIAGNOSIS — I352 Nonrheumatic aortic (valve) stenosis with insufficiency: Secondary | ICD-10-CM | POA: Diagnosis not present

## 2019-11-05 DIAGNOSIS — Z6831 Body mass index (BMI) 31.0-31.9, adult: Secondary | ICD-10-CM | POA: Insufficient documentation

## 2019-11-05 DIAGNOSIS — F329 Major depressive disorder, single episode, unspecified: Secondary | ICD-10-CM | POA: Diagnosis not present

## 2019-11-05 DIAGNOSIS — E119 Type 2 diabetes mellitus without complications: Secondary | ICD-10-CM | POA: Diagnosis not present

## 2019-11-05 DIAGNOSIS — K219 Gastro-esophageal reflux disease without esophagitis: Secondary | ICD-10-CM | POA: Diagnosis not present

## 2019-11-05 DIAGNOSIS — I10 Essential (primary) hypertension: Secondary | ICD-10-CM | POA: Diagnosis not present

## 2019-11-05 DIAGNOSIS — G2581 Restless legs syndrome: Secondary | ICD-10-CM | POA: Insufficient documentation

## 2019-11-05 HISTORY — PX: CARDIOVERSION: SHX1299

## 2019-11-05 SURGERY — CARDIOVERSION
Anesthesia: General

## 2019-11-05 MED ORDER — SODIUM CHLORIDE 0.9 % IV SOLN
INTRAVENOUS | Status: DC
  Administered 2019-11-05: 500 mL via INTRAVENOUS

## 2019-11-05 MED ORDER — APIXABAN 5 MG PO TABS
5.0000 mg | ORAL_TABLET | Freq: Once | ORAL | Status: AC
  Administered 2019-11-05: 5 mg via ORAL
  Filled 2019-11-05: qty 1

## 2019-11-05 MED ORDER — LIDOCAINE 2% (20 MG/ML) 5 ML SYRINGE
INTRAMUSCULAR | Status: DC | PRN
  Administered 2019-11-05: 50 mg via INTRAVENOUS

## 2019-11-05 MED ORDER — PROPOFOL 10 MG/ML IV BOLUS
INTRAVENOUS | Status: DC | PRN
  Administered 2019-11-05: 50 mg via INTRAVENOUS

## 2019-11-05 NOTE — Anesthesia Postprocedure Evaluation (Signed)
Anesthesia Post Note  Patient: Briana Foster  Procedure(s) Performed: CARDIOVERSION (N/A )     Patient location during evaluation: PACU Anesthesia Type: General Level of consciousness: awake and alert Pain management: pain level controlled Vital Signs Assessment: post-procedure vital signs reviewed and stable Respiratory status: spontaneous breathing, nonlabored ventilation and respiratory function stable Cardiovascular status: blood pressure returned to baseline and stable Postop Assessment: no apparent nausea or vomiting Anesthetic complications: no   No complications documented.  Last Vitals:  Vitals:   11/05/19 0857 11/05/19 0858  BP:  125/75  Pulse: 84 80  Resp: 12 10  Temp:    SpO2: 93% 93%    Last Pain:  Vitals:   11/05/19 0852  TempSrc: Oral  PainSc:                  [name]

## 2019-11-05 NOTE — Interval H&P Note (Signed)
History and Physical Interval Note:  11/05/2019 7:39 AM  Briana Foster  has presented today for surgery, with the diagnosis of AFIB.  The various methods of treatment have been discussed with the patient and family. After consideration of risks, benefits and other options for treatment, the patient has consented to  Procedure(s): CARDIOVERSION (N/A) as a surgical intervention.  The patient's history has been reviewed, patient examined, no change in status, stable for surgery.  I have reviewed the patient's chart and labs.  Questions were answered to the patient's satisfaction.     [name]

## 2019-11-05 NOTE — Procedures (Signed)
Electrical Cardioversion Procedure Note Briana Foster [id number] 1945/10/03  Procedure: Electrical Cardioversion Indications:  Atrial Fibrillation  Procedure Details Consent: Risks of procedure as well as the alternatives and risks of each were explained to the (patient/caregiver).  Consent for procedure obtained. Time Out: Verified patient identification, verified procedure, site/side was marked, verified correct patient position, special equipment/implants available, medications/allergies/relevent history reviewed, required imaging and test results available.  Performed  Patient placed on cardiac monitor, pulse oximetry, supplemental oxygen as necessary.  Sedation given: Pt sedated by anesthesia with lidocaine 50 mg and diprovan 50 mg IV. Pacer pads placed anterior and posterior chest.  Cardioverted 1 time(s).  Cardioverted at 120J.  Evaluation Findings: Post procedure EKG shows: NSR Complications: None Patient did tolerate procedure well.   [name] 11/05/2019, 7:39 AM

## 2019-11-05 NOTE — Anesthesia Procedure Notes (Signed)
Procedure Name: General with mask airway Date/Time: 11/05/2019 8:40 AM Performed by: Jodell Cipro, CRNA Pre-anesthesia Checklist: Patient identified, Emergency Drugs available, Suction available and Patient being monitored Patient Re-evaluated:Patient Re-evaluated prior to induction Oxygen Delivery Method: Ambu bag Preoxygenation: Pre-oxygenation with 100% oxygen Induction Type: IV induction Ventilation: Mask ventilation without difficulty Placement Confirmation: positive ETCO2 and breath sounds checked- equal and bilateral Dental Injury: Teeth and Oropharynx as per pre-operative assessment

## 2019-11-05 NOTE — H&P (Signed)
Office Visit 10/31/2019 [facility]   Lyn Records, MD Cardiology  Aortic valve stenosis, etiology of cardiac valve disease unspecified +5 more Dx  Atrial Fibrillation. Cardiac Valve Problem ; Referred by Georgann Housekeeper, MD Reason for Visit  Additional Documentation  Vitals:  BP 110/78  Pulse 126Important   Ht 5\' 7"  (1.702 m)  Wt 92.1 kg  SpO2 96%  BMI 31.79 kg/m  BSA 2.09 m  Flowsheets:  NEWS,  MEWS Score,  Anthropometrics    Encounter Info:  Billing Info,  History,  Allergies,  Detailed Report    All Notes  Progress Notes by Lyn Records, MD at 10/31/2019 10:20 AM Author: Lyn Records, MD Author Type: Physician Filed: 10/31/2019 10:34 AM  Note Status: Signed Cosign: Cosign Not Required Encounter Date: 10/31/2019  Editor: Lyn Records, MD (Physician)               Expand All Collapse All  Cardiology Office Note:    Date:  10/31/2019   ID:  Briana Foster, DOB July 02, 1945, MRN [id number]  PCP:  Georgann Housekeeper, MD       Cardiologist:  Lesleigh Noe, MD   Referring MD: Georgann Housekeeper, MD       Chief Complaint  Patient presents with  . Atrial Fibrillation  . Cardiac Valve Problem    Moderate aortic stenosis    History of Present Illness:    Briana Foster is a 74 y.o. female with a hx of hypertension, DM 2, cardiomegaly, chest pain evaluation(no recurrence) , andmoderateaortic stenosis (Moderate to severe 10/202000), now being managed for new atrial fibrillation and loading amiodarone.  She feels better since starting amiodarone.  There is still some dyspnea on exertion.  No orthopnea.  No episodes of chest pain or syncope/near syncope.  She does have moderate to severe aortic stenosis and has been tolerating the hemodynamic/heart rate stress of poorly controlled ventricular response without developing heart failure.  After the documenting atrial fibrillation, we discussed outpatient electrical cardioversion  including the benefits (improved energy, decreased risk of heart failure, and decreased risk of stroke) versus risk of failure, less than 10%, mechanical injury, stroke, respiratory failure, or more severe arrhythmias that require urgent management.      Past Medical History:  Diagnosis Date  . Anemia   . Arthritis 07-20-12   hands  . Asthma   . Back pain   . Cardiomegaly   . Class 1 obesity with serious comorbidity and body mass index (BMI) of 31.0 to 31.9 in adult 03/26/2018  . Depression   . Diabetes (HCC)   . Essential hypertension 03/26/2018  . GERD (gastroesophageal reflux disease)    "comes and goes" - no meds currently  . Heart murmur    told as teenager  . High blood pressure   . History of hypertension 07-20-12   pt stopped BP med several months ago because BP has been normal-MD is aware  . Hyperlipidemia   . Joint pain   . Restless leg syndrome   . Rheumatoid arthritis (HCC)   . Shortness of breath   . Tiredness   . Type 2 diabetes mellitus without complication, without long-term current use of insulin (HCC) 04/10/2018  . Vitamin D deficiency          Past Surgical History:  Procedure Laterality Date  . BUNIONECTOMY  20 yrs ago   bil feet  . BUNIONECTOMY  11/30/2011   Procedure: Arbutus Leas;  Surgeon: Fayrene Fearing  P Aplington, MD;  Location: [facility];  Service: Orthopedics;  Laterality: Bilateral;  RIGHT FOOT EXCISION OF BUNIONETTE AND PARTIAL   PROXIMAL PHALANGECTOMY OF 5TH TOE LEFT FOOT FUNK BUNIONECTOMY,EXCISION OF BUNIONETTE   . COLONOSCOPY WITH PROPOFOL N/A 08/07/2012   Procedure: COLONOSCOPY WITH PROPOFOL;  Surgeon: Charolett Bumpers, MD;  Location: [facility];  Service: Endoscopy;  Laterality: N/A;  . MOUTH SURGERY     teeth extractions  . TONSILLECTOMY    . TUBAL LIGATION    . WRIST SURGERY      Current Medications: Active Medications      Current Meds  Medication Sig  . albuterol (PROVENTIL HFA;VENTOLIN HFA) 108 (90  Base) MCG/ACT inhaler Inhale 2 puffs into the lungs every 6 (six) hours as needed for wheezing or shortness of breath.  Marland Kitchen amiodarone (PACERONE) 200 MG tablet Take 1 tablet (200 mg total) by mouth 2 (two) times daily.  Marland Kitchen amLODipine-benazepril (LOTREL) 10-20 MG capsule Take 1 capsule by mouth daily.  Marland Kitchen apixaban (ELIQUIS) 5 MG TABS tablet Take 1 tablet (5 mg total) by mouth 2 (two) times daily.  . calcium-vitamin D (OSCAL WITH D) 500-200 MG-UNIT tablet Take 1 tablet by mouth.  . diltiazem (CARDIZEM CD) 180 MG 24 hr capsule Take 1 capsule (180 mg total) by mouth daily.  . DULoxetine (CYMBALTA) 30 MG capsule Take 30 mg by mouth daily.  Marland Kitchen losartan (COZAAR) 50 MG tablet Take 50 mg by mouth every morning.  . metFORMIN (GLUCOPHAGE) 850 MG tablet Take 850 mg by mouth 2 (two) times daily.  . methocarbamol (ROBAXIN) 500 MG tablet Take 500 mg by mouth 3 (three) times daily as needed.  . pantoprazole (PROTONIX) 40 MG tablet Take 40 mg by mouth daily.  . predniSONE (DELTASONE) 20 MG tablet Take 20 mg by mouth 2 (two) times daily.  Marland Kitchen rOPINIRole (REQUIP) 4 MG tablet Take 4 mg by mouth at bedtime.  . rosuvastatin (CRESTOR) 20 MG tablet Take 20 mg by mouth daily.  . Vitamin D, Ergocalciferol, (DRISDOL) 1.25 MG (50000 UT) CAPS capsule Take 1 capsule (50,000 Units total) by mouth every 7 (seven) days.  . [DISCONTINUED] rOPINIRole (REQUIP) 3 MG tablet Take 3 mg by mouth at bedtime.       Allergies:   Apricot flavor and Bee venom   Social History        Socioeconomic History  . Marital status: Widowed    Spouse name: Not on file  . Number of children: Not on file  . Years of education: Not on file  . Highest education level: Not on file  Occupational History  . Occupation: Retired  Tobacco Use  . Smoking status: Never Smoker  . Smokeless tobacco: Never Used  Substance and Sexual Activity  . Alcohol use: No  . Drug use: No  . Sexual activity: Not Currently  Other Topics Concern  . Not on file    Social History Narrative  . Not on file   Social Determinants of Health      Financial Resource Strain:   . Difficulty of Paying Living Expenses: Not on file  Food Insecurity:   . Worried About Programme researcher, broadcasting/film/video in the Last Year: Not on file  . Ran Out of Food in the Last Year: Not on file  Transportation Needs:   . Lack of Transportation (Medical): Not on file  . Lack of Transportation (Non-Medical): Not on file  Physical Activity:   . Days of Exercise per Week: Not on file  .  Minutes of Exercise per Session: Not on file  Stress:   . Feeling of Stress : Not on file  Social Connections:   . Frequency of Communication with Friends and Family: Not on file  . Frequency of Social Gatherings with Friends and Family: Not on file  . Attends Religious Services: Not on file  . Active Member of Clubs or Organizations: Not on file  . Attends Banker Meetings: Not on file  . Marital Status: Not on file     Family History: The patient's family history includes Cancer in her father; Heart disease in her mother; Stroke in her mother.  ROS:   Please see the history of present illness.    No blood in the urine or stool.  No neurological complaints.  She does not sleep well.  Insomnia predates atrial fibrillation.  She has not had lower extremity swelling, syncope, or angina.  All other systems reviewed and are negative.  EKGs/Labs/Other Studies Reviewed:    The following studies were reviewed today:  CONTINUOUS MONITOR 2021: Study Highlights    Continuous atrial fibrillation  Heart rate range 51-177 bpm with average heart rate 116 bpm.  Continuous atrial fibrillation with poor rate control.  2D Doppler Echocardiogram 2020: IMPRESSIONS    1. Patient appears to be in afib making estimation of EF more  challenging. Cannot r/o low flow severe AS but DVI 0.34 and AVA by VTI  1.07 cm2. Left ventricular ejection fraction, by estimation, is 45 to 50%.  The  left ventricle has mildly decreased  function. The left ventricle demonstrates global hypokinesis. The left  ventricular internal cavity size was mildly dilated. There is mild left  ventricular hypertrophy. Left ventricular diastolic parameters are  indeterminate.  2. Right ventricular systolic function is normal. The right ventricular  size is normal. There is normal pulmonary artery systolic pressure.  3. Left atrial size was mildly dilated.  4. The mitral valve is normal in structure. Mild to moderate mitral valve  regurgitation. No evidence of mitral stenosis.  5. The aortic valve was not well visualized. Aortic valve regurgitation  is not visualized. Moderate to severe aortic valve stenosis.  6. The inferior vena cava is normal in size with greater than 50%  respiratory variability, suggesting right atrial pressure of 3 mmHg.   EKG:  EKG atrial fibrillation with rapid ventricular response at 126 bpm but asymptomatic.  Prominent voltage with no ST-T wave abnormality.  Recent Labs: No results found for requested labs within last 8760 hours.  Recent Lipid Panel Labs (Brief)  No results found for: CHOL, TRIG, HDL, CHOLHDL, VLDL, LDLCALC, LDLDIRECT    Physical Exam:    VS:  BP 110/78   Pulse (!) 126   Ht 5\' 7"  (1.702 m)   Wt 203 lb (92.1 kg)   SpO2 96%   BMI 31.79 kg/m        Wt Readings from Last 3 Encounters:  10/31/19 203 lb (92.1 kg)  10/16/19 205 lb 9.6 oz (93.3 kg)  09/19/19 202 lb 12.8 oz (92 kg)     GEN: Compensated, appearing compatible with age in appearance. No acute distress HEENT: Normal NECK: No JVD. LYMPHATICS: No lymphadenopathy CARDIAC: Rapid irregularly irregular RR with 2/6 to 3/6 crescendo decrescendo systolic murmur, gallop, or edema. VASCULAR:  Normal Pulses. No bruits. RESPIRATORY:  Clear to auscultation without rales, wheezing or rhonchi  ABDOMEN: Soft, non-tender, non-distended, No pulsatile mass, MUSCULOSKELETAL: No deformity    SKIN: Warm and dry NEUROLOGIC:  Alert and oriented x 3 PSYCHIATRIC:  Normal affect   ASSESSMENT:    1. Aortic valve stenosis, etiology of cardiac valve disease unspecified   2. New onset atrial fibrillation (HCC)   3. Essential hypertension   4. Hyperlipidemia LDL goal <70   5. Type 2 diabetes mellitus without complication, without long-term current use of insulin (HCC)   6. Educated about COVID-19 virus infection    PLAN:    In order of problems listed above:  1. AFIB has caused acute diastolic HF.  Will convert atrial fibrillation to sinus rhythm and then consider proceeding with w/u for TAVR. 2. Will convert AF with electrical cardioversion within the coming week.  Risks and benefits have been discussed.  Has been on continuous anticoagulation therapy for greater than 4 weeks. Continue amiodarone 200 mg twice daily until she returns for office visit 2 weeks after cardioversion.  3. Discontinue diltiazem after cardioversion if sinus rhythm is successfully restored.\Continue Lotrel 10/20 mg daily.  May decide to use a to use a diuretic if needed.  Primary care started losartan recently but the patient has not taken that because it causes nausea.  She should not resume this medication. 4. Continue Crestor 20 mg/day 5. Continue Glucophage but can consider SGLT2 therapy. 6. Is vaccinated.  Practicing social mitigation.   Medication Adjustments/Labs and Tests Ordered: Current medicines are reviewed at length with the patient today.  Concerns regarding medicines are outlined above.     Orders Placed This Encounter  Procedures  . CBC  . Basic metabolic panel  . EKG 12-Lead   No orders of the defined types were placed in this encounter.   Patient Instructions  Medication Instructions:  Your physician recommends that you continue on your current medications as directed. Please refer to the Current Medication list given to you today.  *If you need a refill on your  cardiac medications before your next appointment, please call your pharmacy*   Lab Work: BMET and CBC today  If you have labs (blood work) drawn today and your tests are completely normal, you will receive your results only by:  MyChart Message (if you have MyChart) OR  A paper copy in the mail If you have any lab test that is abnormal or we need to change your treatment, we will call you to review the results.   Testing/Procedures: Your physician has recommended that you have a Cardioversion (DCCV). Electrical Cardioversion uses a jolt of electricity to your heart either through paddles or wired patches attached to your chest. This is a controlled, usually prescheduled, procedure. Defibrillation is done under light anesthesia in the hospital, and you usually go home the day of the procedure. This is done to get your heart back into a normal rhythm. You are not awake for the procedure. Please see the instruction sheet given to you today.    Follow-Up: At [facility], you and your health needs are our priority. As part of our continuing mission to provide you with exceptional heart care, we have created designated Provider Care Teams. These Care Teams include your primary Cardiologist (physician) and Advanced Practice Providers (APPs -  Physician Assistants and Nurse Practitioners) who all work together to provide you with the care you need, when you need it.  We recommend signing up for the patient portal called "MyChart".  Sign up information is provided on this After Visit Summary.  MyChart is used to connect with patients for Virtual Visits (Telemedicine).  Patients are able to view  lab/test results, encounter notes, upcoming appointments, etc.  Non-urgent messages can be sent to your provider as well.   To learn more about what you can do with MyChart, go to [email or web address].    Your next appointment:   2 week(s)  The format for your next appointment:   In  Person  Provider:   You may see Lesleigh Noe, MD or one of the following Advanced Practice Providers on your designated Care Team:   ? Norma Fredrickson, NP ? Nada Boozer, NP ? Georgie Chard, NP    Other Instructions       Signed, Lesleigh Noe, MD  10/31/2019 10:25 AM    [facility]     Compliant with apixaban; for DCCV; no changes [name]

## 2019-11-05 NOTE — Anesthesia Preprocedure Evaluation (Signed)
Anesthesia Evaluation    Airway Mallampati: II  TM Distance: >3 FB Neck ROM: Full    Dental  (+) Upper Dentures, Lower Dentures   Pulmonary shortness of breath and with exertion, asthma ,    Pulmonary exam normal breath sounds clear to auscultation       Cardiovascular hypertension, Pt. on medications + dysrhythmias Atrial Fibrillation + Valvular Problems/Murmurs  Rhythm:Irregular Rate:Normal  EKG 10/3019 Atrial fibrillation with RVR   Neuro/Psych PSYCHIATRIC DISORDERS Depression Restless legs syndrome    GI/Hepatic Neg liver ROS, GERD  Medicated and Controlled,  Endo/Other  diabetes, Well Controlled, Type 2, Oral Hypoglycemic AgentsObesity  Hyperlipidemia  Renal/GU negative Renal ROS  negative genitourinary   Musculoskeletal  (+) Arthritis , Osteoarthritis,    Abdominal (+) + obese,   Peds  Hematology  (+) anemia , Eliquis therapy- last dose yesterday am; patient to get another dose this am   Anesthesia Other Findings   Reproductive/Obstetrics                             Anesthesia Physical Anesthesia Plan  ASA: III  Anesthesia Plan: General   Post-op Pain Management:    Induction: Intravenous  PONV Risk Score and Plan: 3 and Treatment may vary due to age or medical condition and Ondansetron  Airway Management Planned: Natural Airway and Mask  Additional Equipment:   Intra-op Plan:   Post-operative Plan:   Informed Consent: I have reviewed the patients History and Physical, chart, labs and discussed the procedure including the risks, benefits and alternatives for the proposed anesthesia with the patient or authorized representative who has indicated his/her understanding and acceptance.       Plan Discussed with: CRNA and Anesthesiologist  Anesthesia Plan Comments:         Anesthesia Quick Evaluation

## 2019-11-05 NOTE — Transfer of Care (Signed)
Immediate Anesthesia Transfer of Care Note  Patient: Lewis Shock  Procedure(s) Performed: CARDIOVERSION (N/A )  Patient Location: PACU and Endoscopy Unit  Anesthesia Type:General  Level of Consciousness: drowsy and patient cooperative  Airway & Oxygen Therapy: Patient Spontanous Breathing and Patient connected to nasal cannula oxygen  Post-op Assessment: Report given to RN and Post -op Vital signs reviewed and stable  Post vital signs: Reviewed and stable  Last Vitals:  Vitals Value Taken Time  BP    Temp    Pulse    Resp    SpO2      Last Pain:  Vitals:   11/05/19 0725  TempSrc: Oral  PainSc: 0-No pain         Complications: No complications documented.

## 2019-11-08 ENCOUNTER — Encounter (HOSPITAL_COMMUNITY): Payer: Self-pay | Admitting: Cardiology

## 2019-11-18 ENCOUNTER — Telehealth: Payer: Self-pay | Admitting: Interventional Cardiology

## 2019-11-18 NOTE — Telephone Encounter (Signed)
Patient c/o Palpitations:  High priority if patient c/o lightheadedness, shortness of breath, or chest pain  1) How long have you had palpitations/irregular HR/ Afib? Are you having the symptoms now? She can feel that her heart is out of rhythm, she feels its been this way for about a week. Had a cardioversion about 2 weeks ago.   2) Are you currently experiencing lightheadedness, SOB or CP? SOB and some lightheadedness  3) Do you have a history of afib (atrial fibrillation) or irregular heart rhythm? Yes, afib  4) Have you checked your BP or HR? (document readings if available): not today. Last night her BP was around 163/NA and she thinks her HR was around 83 but not really sure.  5) Are you experiencing any other symptoms? no   Called triage, advised to send phone note.

## 2019-11-18 NOTE — Telephone Encounter (Signed)
Spoke with pt who states she was cardioverted for A-FIB 2 weeks ago and noticed she was having some SOB again.  Pt was seen at [facility] and advised her "heart is out of rhythm again."  Pt denies CP or any additional symptoms at this time.  Offered pt appointment with A-FIB clinic and she declines.  States she only wants to see Dr Katrinka Blazing. Advised pt Dr Katrinka Blazing is out of the office this week.Pt states she is scheduled for f/u with Dr Katrinka Blazing 11/25/2019 and will keep that appointment.  Discussed importance of taking Eliquis as directed.  Reviewed ED protocol. Pt verbalizes understanding.

## 2019-11-24 NOTE — Progress Notes (Signed)
Cardiology Office Note:    Date:  11/25/2019   ID:  Briana Foster, DOB 05/25/1945, MRN [id number]  PCP:  Georgann Housekeeper, MD  Cardiologist:  Lesleigh Noe, MD   Referring MD: Georgann Housekeeper, MD   Chief Complaint  Patient presents with  . Atrial Fibrillation  . Cardiac Valve Problem    Severe aortic stenosis    History of Present Illness:    Briana Foster is a 74 y.o. female with a hx of a hx of hypertension, DM 2, cardiomegaly, chest pain evaluation(no recurrence) , andaortic stenosis (severe 10/202021), now being managed for new atrial fibrillation and loading amiodarone.  Underwent successful electrical cardioversion on 10/31/2019 to normal sinus rhythm.  Had cardioversion 3 weeks ago.  Cardioversion was successful, and was associated with a dramatic improvement in exertional tolerance and dyspnea.  Over the past week or so she has noted that dyspnea and fatigue have started to recur.  She still feels better than she did prior to cardioversion.  Unfortunately, today's EKG demonstrates atrial fibrillation with poorly controlled ventricular response at 129 bpm.  No evidence of ischemia is noted.  She denies angina, orthopnea, PND, syncope, and neurological symptoms.  No bleeding on Eliquis.  Past Medical History:  Diagnosis Date  . Anemia   . Arthritis 07-20-12   hands  . Asthma   . Back pain   . Cardiomegaly   . Class 1 obesity with serious comorbidity and body mass index (BMI) of 31.0 to 31.9 in adult 03/26/2018  . Depression   . Diabetes (HCC)   . Essential hypertension 03/26/2018  . GERD (gastroesophageal reflux disease)    "comes and goes" - no meds currently  . Heart murmur    told as teenager  . High blood pressure   . History of hypertension 07-20-12   pt stopped BP med several months ago because BP has been normal-MD is aware  . Hyperlipidemia   . Joint pain   . Restless leg syndrome   . Rheumatoid arthritis (HCC)   . Shortness of breath   . Tiredness     . Type 2 diabetes mellitus without complication, without long-term current use of insulin (HCC) 04/10/2018  . Vitamin D deficiency     Past Surgical History:  Procedure Laterality Date  . BUNIONECTOMY  20 yrs ago   bil feet  . BUNIONECTOMY  11/30/2011   Procedure: Arbutus Leas;  Surgeon: Drucilla Schmidt, MD;  Location: [facility];  Service: Orthopedics;  Laterality: Bilateral;  RIGHT FOOT EXCISION OF BUNIONETTE AND PARTIAL   PROXIMAL PHALANGECTOMY OF 5TH TOE LEFT FOOT FUNK BUNIONECTOMY,EXCISION OF BUNIONETTE   . CARDIOVERSION N/A 11/05/2019   Procedure: CARDIOVERSION;  Surgeon: Lewayne Bunting, MD;  Location: [facility] ENDOSCOPY;  Service: Cardiovascular;  Laterality: N/A;  . COLONOSCOPY WITH PROPOFOL N/A 08/07/2012   Procedure: COLONOSCOPY WITH PROPOFOL;  Surgeon: Charolett Bumpers, MD;  Location: [facility] ENDOSCOPY;  Service: Endoscopy;  Laterality: N/A;  . MOUTH SURGERY     teeth extractions  . TONSILLECTOMY    . TUBAL LIGATION    . WRIST SURGERY      Current Medications: Current Meds  Medication Sig  . albuterol (PROVENTIL HFA;VENTOLIN HFA) 108 (90 Base) MCG/ACT inhaler Inhale 2 puffs into the lungs every 6 (six) hours as needed for wheezing or shortness of breath.  Marland Kitchen amiodarone (PACERONE) 200 MG tablet Take 1 tablet (200 mg total) by mouth 2 (two) times daily.  Marland Kitchen amLODipine (NORVASC) 5 MG tablet Take  5 mg by mouth daily.  Marland Kitchen apixaban (ELIQUIS) 5 MG TABS tablet Take 1 tablet (5 mg total) by mouth 2 (two) times daily.  . cholecalciferol (VITAMIN D) 25 MCG (1000 UNIT) tablet Take 1,000 Units by mouth 2 (two) times a week.  . Cyanocobalamin (VITAMIN B-12) 2500 MCG SUBL Place 2,500 mcg under the tongue 3 (three) times a week.  . diltiazem (CARDIZEM CD) 180 MG 24 hr capsule Take 1 capsule (180 mg total) by mouth daily.  . DULoxetine (CYMBALTA) 30 MG capsule Take 30 mg by mouth 2 (two) times a week.   . metFORMIN (GLUCOPHAGE) 850 MG tablet Take 850 mg by mouth 2 (two) times daily.  . methocarbamol  (ROBAXIN) 500 MG tablet Take 500 mg by mouth 3 (three) times daily as needed for muscle spasms.   . pantoprazole (PROTONIX) 40 MG tablet Take 40 mg by mouth daily as needed (upset stomach).   Marland Kitchen rOPINIRole (REQUIP) 4 MG tablet Take 4 mg by mouth at bedtime.  . rosuvastatin (CRESTOR) 20 MG tablet Take 20 mg by mouth daily.  . Vitamin D, Ergocalciferol, (DRISDOL) 1.25 MG (50000 UT) CAPS capsule Take 1 capsule (50,000 Units total) by mouth every 7 (seven) days.     Allergies:   Bee venom and Apricot flavor   Social History   Socioeconomic History  . Marital status: Widowed    Spouse name: Not on file  . Number of children: Not on file  . Years of education: Not on file  . Highest education level: Not on file  Occupational History  . Occupation: Retired  Tobacco Use  . Smoking status: Never Smoker  . Smokeless tobacco: Never Used  Substance and Sexual Activity  . Alcohol use: No  . Drug use: No  . Sexual activity: Not Currently  Other Topics Concern  . Not on file  Social History Narrative  . Not on file   Social Determinants of Health   Financial Resource Strain:   . Difficulty of Paying Living Expenses: Not on file  Food Insecurity:   . Worried About Programme researcher, broadcasting/film/video in the Last Year: Not on file  . Ran Out of Food in the Last Year: Not on file  Transportation Needs:   . Lack of Transportation (Medical): Not on file  . Lack of Transportation (Non-Medical): Not on file  Physical Activity:   . Days of Exercise per Week: Not on file  . Minutes of Exercise per Session: Not on file  Stress:   . Feeling of Stress : Not on file  Social Connections:   . Frequency of Communication with Friends and Family: Not on file  . Frequency of Social Gatherings with Friends and Family: Not on file  . Attends Religious Services: Not on file  . Active Member of Clubs or Organizations: Not on file  . Attends Banker Meetings: Not on file  . Marital Status: Not on file      Family History: The patient's family history includes Cancer in her father; Heart disease in her mother; Stroke in her mother.  ROS:   Please see the history of present illness.    No new data all other systems reviewed and are negative.  EKGs/Labs/Other Studies Reviewed:    The following studies were reviewed today: ECHOCARDIOGRAM 10/2019: IMPRESSIONS    1. Patient appears to be in afib making estimation of EF more  challenging. Cannot r/o low flow severe AS but DVI 0.34 and AVA by VTI  1.07 cm2. Left ventricular ejection fraction, by estimation, is 45 to 50%.  The left ventricle has mildly decreased  function. The left ventricle demonstrates global hypokinesis. The left  ventricular internal cavity size was mildly dilated. There is mild left  ventricular hypertrophy. Left ventricular diastolic parameters are  indeterminate.  2. Right ventricular systolic function is normal. The right ventricular  size is normal. There is normal pulmonary artery systolic pressure.  3. Left atrial size was mildly dilated.  4. The mitral valve is normal in structure. Mild to moderate mitral valve  regurgitation. No evidence of mitral stenosis.  5. The aortic valve was not well visualized. Aortic valve regurgitation  is not visualized. Moderate to severe aortic valve stenosis.  6. The inferior vena cava is normal in size with greater than 50%  respiratory variability, suggesting right atrial pressure of 3 mmHg.   EKG:  EKG atrial fibrillation with rapid ventricular response at 121 bpm.  No ischemic changes are noted.  This is the tracing of November 25, 2019.  Recent Labs: 10/31/2019: BUN 16; Creatinine, Ser 0.86; Hemoglobin 14.5; Platelets 254; Potassium 4.8; Sodium 138  Recent Lipid Panel No results found for: CHOL, TRIG, HDL, CHOLHDL, VLDL, LDLCALC, LDLDIRECT  Physical Exam:    VS:  BP 136/72   Pulse (!) 121   Ht 5' 7.5" (1.715 m)   Wt 205 lb (93 kg)   SpO2 96%   BMI 31.63 kg/m      Wt Readings from Last 3 Encounters:  11/25/19 205 lb (93 kg)  11/05/19 195 lb (88.5 kg)  10/31/19 203 lb (92.1 kg)     GEN: Obese. No acute distress HEENT: Normal NECK: No JVD. LYMPHATICS: No lymphadenopathy CARDIAC: Irregularly irregular rapid RR without murmur, gallop, or edema. VASCULAR:  Normal Pulses. No bruits. RESPIRATORY:  Clear to auscultation without rales, wheezing or rhonchi  ABDOMEN: Soft, non-tender, non-distended, No pulsatile mass, MUSCULOSKELETAL: No deformity  SKIN: Warm and dry NEUROLOGIC:  Alert and oriented x 3 PSYCHIATRIC:  Normal affect   ASSESSMENT:    1. New onset atrial fibrillation (HCC)   2. Aortic valve stenosis, etiology of cardiac valve disease unspecified   3. Essential hypertension   4. Hyperlipidemia LDL goal <70   5. Type 2 diabetes mellitus without complication, without long-term current use of insulin (HCC)   6. Educated about COVID-19 virus infection    PLAN:    In order of problems listed above:  1. Recurrent atrial fibrillation after successful cardioversion on amiodarone therapy.  Clear difference in functional capacity and breathing while in sinus rhythm for approximately 2-1/2 weeks.  Over the past week week and a half, she has noted recurrence of exertional fatigue and dyspnea.  Continue amiodarone at 200 mg twice daily.  Add metoprolol 25 mg twice daily, and continue diltiazem low-dose.  Consultation with electrophysiology for recommendations concerning management.  Big question is whether she would benefit from ablation. 2. Her clinical situation is complicated by severe aortic stenosis as progression was noted on the most recent echo as outlined above.  She will need left and right heart cath and probable TAVR.  I do not think she has critical coronary disease given her recent clinical condition and absence of angina with significant increased heart rates,.  However she is diabetic and can certainly have obstructive  disease. 3. Blood pressure control has been adequate.  Pressure today is little higher than we would like.  Would like systolic 130 or less.  Metoprolol tartrate 25 mg  twice daily is added to the mix. 4. Continue clinical observation and will need statin therapy.  I have discussed this with the patient but we have not gotten to that point yet. 5. Hemoglobin A1c target less than 7.  SGLT2 therapy may be helpful in the future. 6. COVID-19 vaccine has been received.  She has not suffered COVID-19 disease.  1 week follow-up to assess rate control, review EP recommendations, and begin work-up for aortic valve therapy.   Medication Adjustments/Labs and Tests Ordered: Current medicines are reviewed at length with the patient today.  Concerns regarding medicines are outlined above.  Orders Placed This Encounter  Procedures  . Ambulatory referral to Cardiac Electrophysiology  . EKG 12-Lead   Meds ordered this encounter  Medications  . metoprolol tartrate (LOPRESSOR) 25 MG tablet    Sig: Take 1 tablet (25 mg total) by mouth 2 (two) times daily.    Dispense:  180 tablet    Refill:  3    Patient Instructions  Medication Instructions:  1) START Metoprolol Tartrate 25mg  twice daily  *If you need a refill on your cardiac medications before your next appointment, please call your pharmacy*   Lab Work: None If you have labs (blood work) drawn today and your tests are completely normal, you will receive your results only by: MyChart Message (if you have MyChart) OR . A paper copy in the mail If you have any lab test that is abnormal or we need to change your treatment, we will call you to review the results.   Testing/Procedures: None   Follow-Up: At [facility], you and your health needs are our priority.  As part of our continuing mission to provide you with exceptional heart care, we have created designated Provider Care Teams.  These Care Teams include your primary Cardiologist  (physician) and Advanced Practice Providers (APPs -  Physician Assistants and Nurse Practitioners) who all work together to provide you with the care you need, when you need it.  We recommend signing up for the patient portal called "MyChart".  Sign up information is provided on this After Visit Summary.  MyChart is used to connect with patients for Virtual Visits (Telemedicine).  Patients are able to view lab/test results, encounter notes, upcoming appointments, etc.  Non-urgent messages can be sent to your provider as well.   To learn more about what you can do with MyChart, go to [facility].    Your next appointment:   1 week(s)  The format for your next appointment:   In Person  Provider:   You may see [email or web address], MD or one of the following Advanced Practice Providers on your designated Care Team:    Lesleigh Noe, NP  Norma Fredrickson, NP  Nada Boozer, NP    Other Instructions  Your physician is referring you to our Electrophysiology team.      Signed, Georgie Chard, MD  11/25/2019 4:36 PM    [facility]

## 2019-11-25 ENCOUNTER — Encounter: Payer: Self-pay | Admitting: Interventional Cardiology

## 2019-11-25 ENCOUNTER — Other Ambulatory Visit: Payer: Self-pay

## 2019-11-25 ENCOUNTER — Ambulatory Visit: Payer: Medicare Other | Admitting: Interventional Cardiology

## 2019-11-25 VITALS — BP 136/72 | HR 121 | Ht 67.5 in | Wt 205.0 lb

## 2019-11-25 DIAGNOSIS — E785 Hyperlipidemia, unspecified: Secondary | ICD-10-CM | POA: Diagnosis not present

## 2019-11-25 DIAGNOSIS — I350 Nonrheumatic aortic (valve) stenosis: Secondary | ICD-10-CM | POA: Diagnosis not present

## 2019-11-25 DIAGNOSIS — I10 Essential (primary) hypertension: Secondary | ICD-10-CM

## 2019-11-25 DIAGNOSIS — E119 Type 2 diabetes mellitus without complications: Secondary | ICD-10-CM

## 2019-11-25 DIAGNOSIS — I4891 Unspecified atrial fibrillation: Secondary | ICD-10-CM | POA: Diagnosis not present

## 2019-11-25 DIAGNOSIS — Z7189 Other specified counseling: Secondary | ICD-10-CM

## 2019-11-25 MED ORDER — METOPROLOL TARTRATE 25 MG PO TABS: 25.0000 mg | ORAL_TABLET | Freq: Two times a day (BID) | ORAL | 3 refills | Status: DC

## 2019-11-25 NOTE — Patient Instructions (Signed)
Medication Instructions:  1) START Metoprolol Tartrate 25mg  twice daily  *If you need a refill on your cardiac medications before your next appointment, please call your pharmacy*   Lab Work: None If you have labs (blood work) drawn today and your tests are completely normal, you will receive your results only by: MyChart Message (if you have MyChart) OR . A paper copy in the mail If you have any lab test that is abnormal or we need to change your treatment, we will call you to review the results.   Testing/Procedures: None   Follow-Up: At [facility], you and your health needs are our priority.  As part of our continuing mission to provide you with exceptional heart care, we have created designated Provider Care Teams.  These Care Teams include your primary Cardiologist (physician) and Advanced Practice Providers (APPs -  Physician Assistants and Nurse Practitioners) who all work together to provide you with the care you need, when you need it.  We recommend signing up for the patient portal called "MyChart".  Sign up information is provided on this After Visit Summary.  MyChart is used to connect with patients for Virtual Visits (Telemedicine).  Patients are able to view lab/test results, encounter notes, upcoming appointments, etc.  Non-urgent messages can be sent to your provider as well.   To learn more about what you can do with MyChart, go to [facility].    Your next appointment:   1 week(s)  The format for your next appointment:   In Person  Provider:   You may see [email or web address], MD or one of the following Advanced Practice Providers on your designated Care Team:    Lesleigh Noe, NP  Norma Fredrickson, NP  Nada Boozer, NP    Other Instructions  Your physician is referring you to our Electrophysiology team.

## 2019-11-27 ENCOUNTER — Encounter: Payer: Self-pay | Admitting: Cardiology

## 2019-11-27 ENCOUNTER — Other Ambulatory Visit: Payer: Self-pay

## 2019-11-27 ENCOUNTER — Ambulatory Visit (INDEPENDENT_AMBULATORY_CARE_PROVIDER_SITE_OTHER): Payer: Medicare Other | Admitting: Cardiology

## 2019-11-27 VITALS — BP 120/84 | HR 141 | Ht 67.5 in | Wt 203.0 lb

## 2019-11-27 DIAGNOSIS — I4819 Other persistent atrial fibrillation: Secondary | ICD-10-CM | POA: Diagnosis not present

## 2019-11-27 DIAGNOSIS — E119 Type 2 diabetes mellitus without complications: Secondary | ICD-10-CM | POA: Diagnosis not present

## 2019-11-27 DIAGNOSIS — I10 Essential (primary) hypertension: Secondary | ICD-10-CM

## 2019-11-27 DIAGNOSIS — I350 Nonrheumatic aortic (valve) stenosis: Secondary | ICD-10-CM

## 2019-11-27 MED ORDER — METOPROLOL TARTRATE 50 MG PO TABS: 50.0000 mg | ORAL_TABLET | Freq: Two times a day (BID) | ORAL | 3 refills | Status: DC

## 2019-11-27 NOTE — H&P (View-Only) (Signed)
Electrophysiology Office Note:    Date:  11/27/2019   ID:  Briana Foster, Briana Foster 18-Aug-1945, MRN [id number]  PCP:  Georgann Housekeeper, MD  [facility] Cardiologist:  Lesleigh Noe, MD  [facility] Electrophysiologist:  None   Referring MD: Lyn Records, MD   Chief Complaint: Atrial fibrillation  History of Present Illness:    Briana Foster is a 74 y.o. female who presents for an evaluation of atrial fibrillation at the request of Dr. Katrinka Blazing. Their medical history includes severe aortic stenosis, diabetes, hypertension, hyperlipidemia.  She was last seen by Dr. Katrinka Blazing on October 25.  She had recently undergone a successful cardioversion on October 31, 2019.  After she was back in normal rhythm, she had a improvement in her exercise tolerance.  Over the past week the symptoms of dyspnea with exertion recurred and at the visit on October 25, the EKG showed that she had returned to atrial fibrillation.  She has tolerated Eliquis without bleeding events.  Past Medical History:  Diagnosis Date  . Anemia   . Arthritis 07-20-12   hands  . Asthma   . Back pain   . Cardiomegaly   . Class 1 obesity with serious comorbidity and body mass index (BMI) of 31.0 to 31.9 in adult 03/26/2018  . Depression   . Diabetes (HCC)   . Essential hypertension 03/26/2018  . GERD (gastroesophageal reflux disease)    "comes and goes" - no meds currently  . Heart murmur    told as teenager  . High blood pressure   . History of hypertension 07-20-12   pt stopped BP med several months ago because BP has been normal-MD is aware  . Hyperlipidemia   . Joint pain   . Restless leg syndrome   . Rheumatoid arthritis (HCC)   . Shortness of breath   . Tiredness   . Type 2 diabetes mellitus without complication, without long-term current use of insulin (HCC) 04/10/2018  . Vitamin D deficiency     Past Surgical History:  Procedure Laterality Date  . BUNIONECTOMY  20 yrs ago   bil feet  . BUNIONECTOMY   11/30/2011   Procedure: Arbutus Leas;  Surgeon: Drucilla Schmidt, MD;  Location: [facility];  Service: Orthopedics;  Laterality: Bilateral;  RIGHT FOOT EXCISION OF BUNIONETTE AND PARTIAL   PROXIMAL PHALANGECTOMY OF 5TH TOE LEFT FOOT FUNK BUNIONECTOMY,EXCISION OF BUNIONETTE   . CARDIOVERSION N/A 11/05/2019   Procedure: CARDIOVERSION;  Surgeon: Lewayne Bunting, MD;  Location: [facility] ENDOSCOPY;  Service: Cardiovascular;  Laterality: N/A;  . COLONOSCOPY WITH PROPOFOL N/A 08/07/2012   Procedure: COLONOSCOPY WITH PROPOFOL;  Surgeon: Charolett Bumpers, MD;  Location: [facility] ENDOSCOPY;  Service: Endoscopy;  Laterality: N/A;  . MOUTH SURGERY     teeth extractions  . TONSILLECTOMY    . TUBAL LIGATION    . WRIST SURGERY      Current Medications: Current Meds  Medication Sig  . albuterol (PROVENTIL HFA;VENTOLIN HFA) 108 (90 Base) MCG/ACT inhaler Inhale 2 puffs into the lungs every 6 (six) hours as needed for wheezing or shortness of breath.  Marland Kitchen amiodarone (PACERONE) 200 MG tablet Take 1 tablet (200 mg total) by mouth 2 (two) times daily.  Marland Kitchen amLODipine (NORVASC) 5 MG tablet Take 5 mg by mouth daily.  Marland Kitchen apixaban (ELIQUIS) 5 MG TABS tablet Take 1 tablet (5 mg total) by mouth 2 (two) times daily.  . cholecalciferol (VITAMIN D) 25 MCG (1000 UNIT) tablet Take 1,000 Units by mouth  2 (two) times a week.  . Cyanocobalamin (VITAMIN B-12) 2500 MCG SUBL Place 2,500 mcg under the tongue 3 (three) times a week.  . diltiazem (CARDIZEM CD) 180 MG 24 hr capsule Take 1 capsule (180 mg total) by mouth daily.  . DULoxetine (CYMBALTA) 30 MG capsule Take 30 mg by mouth 2 (two) times a week.   . metFORMIN (GLUCOPHAGE) 850 MG tablet Take 850 mg by mouth 2 (two) times daily.  . methocarbamol (ROBAXIN) 500 MG tablet Take 500 mg by mouth 3 (three) times daily as needed for muscle spasms.   . pantoprazole (PROTONIX) 40 MG tablet Take 40 mg by mouth daily as needed (upset stomach).   Marland Kitchen rOPINIRole (REQUIP) 4 MG tablet Take 4 mg by mouth at  bedtime.  . rosuvastatin (CRESTOR) 20 MG tablet Take 20 mg by mouth daily.  . Vitamin D, Ergocalciferol, (DRISDOL) 1.25 MG (50000 UT) CAPS capsule Take 1 capsule (50,000 Units total) by mouth every 7 (seven) days.  . [DISCONTINUED] metoprolol tartrate (LOPRESSOR) 25 MG tablet Take 1 tablet (25 mg total) by mouth 2 (two) times daily.     Allergies:   Bee venom and Apricot flavor   Social History   Socioeconomic History  . Marital status: Widowed    Spouse name: Not on file  . Number of children: Not on file  . Years of education: Not on file  . Highest education level: Not on file  Occupational History  . Occupation: Retired  Tobacco Use  . Smoking status: Never Smoker  . Smokeless tobacco: Never Used  Substance and Sexual Activity  . Alcohol use: No  . Drug use: No  . Sexual activity: Not Currently  Other Topics Concern  . Not on file  Social History Narrative  . Not on file   Social Determinants of Health   Financial Resource Strain:   . Difficulty of Paying Living Expenses: Not on file  Food Insecurity:   . Worried About Programme researcher, broadcasting/film/video in the Last Year: Not on file  . Ran Out of Food in the Last Year: Not on file  Transportation Needs:   . Lack of Transportation (Medical): Not on file  . Lack of Transportation (Non-Medical): Not on file  Physical Activity:   . Days of Exercise per Week: Not on file  . Minutes of Exercise per Session: Not on file  Stress:   . Feeling of Stress : Not on file  Social Connections:   . Frequency of Communication with Friends and Family: Not on file  . Frequency of Social Gatherings with Friends and Family: Not on file  . Attends Religious Services: Not on file  . Active Member of Clubs or Organizations: Not on file  . Attends Banker Meetings: Not on file  . Marital Status: Not on file     Family History: The patient's family history includes Cancer in her father; Heart disease in her mother; Stroke in her  mother.  ROS:   Please see the history of present illness.    All other systems reviewed and are negative.  EKGs/Labs/Other Studies Reviewed:    The following studies were reviewed today: Prior notes, echo   October 02, 2019 echo personally reviewed Left ventricular function mildly decreased, 45% Mild left ventricular hypertrophy Right ventricular function normal Likely low flow low gradient severe aortic stenosis False tendon within the left ventricle  November 25, 2019 EKG personally reviewed shows atrial fibrillation with a ventricular to 121 bpm  EKG:  The ekg ordered today demonstrates atrial fibrillation with rapid ventricular response.  Ventricular rate 141 bpm  Recent Labs: 10/31/2019: BUN 16; Creatinine, Ser 0.86; Hemoglobin 14.5; Platelets 254; Potassium 4.8; Sodium 138  Recent Lipid Panel No results found for: CHOL, TRIG, HDL, CHOLHDL, VLDL, LDLCALC, LDLDIRECT  Physical Exam:    VS:  BP 120/84   Pulse (!) 141   Ht 5' 7.5" (1.715 m)   Wt 203 lb (92.1 kg)   SpO2 98%   BMI 31.33 kg/m     Wt Readings from Last 3 Encounters:  11/27/19 203 lb (92.1 kg)  11/25/19 205 lb (93 kg)  11/05/19 195 lb (88.5 kg)     GEN:  Well nourished, well developed in no acute distress HEENT: Normal NECK: No JVD; No carotid bruits LYMPHATICS: No lymphadenopathy CARDIAC: Irregularly irregular, 3 out of 6 systolic crescendo decrescendo murmur RESPIRATORY:  Clear to auscultation without rales, wheezing or rhonchi  ABDOMEN: Soft, non-tender, non-distended MUSCULOSKELETAL:  No edema; No deformity  SKIN: Warm and dry NEUROLOGIC:  Alert and oriented x 3 PSYCHIATRIC:  Normal affect   ASSESSMENT:    1. Persistent atrial fibrillation (HCC)   2. Nonrheumatic aortic valve stenosis   3. Essential hypertension   4. Type 2 diabetes mellitus without complication, without long-term current use of insulin (HCC)    PLAN:    In order of problems listed above:  1. Persistent atrial  fibrillation CHA2DS2-VASc of 4 on Eliquis.  Poorly rate controlled.  Failed prior cardioversion.  Now started on amiodarone.  She is tolerating amiodarone without off target effect.  Plan to continue amiodarone and perform cardioversion next week to see if she maintains normal rhythm.  In the meantime, I will increase her metoprolol to 50 mg twice daily in addition to her diltiazem. Follow-up in 6 weeks.   2.  Aortic valve stenosis Echo findings concerning for severe low-flow low gradient aortic stenosis.  Valve is heavily calcified with significantly reduced motion.  Unfortunately the severity of her aortic valve stenosis limits my ability to safely ablate her atrial fibrillation.  Recommend additional work-up for her aortic valve to determine whether or not replacement of the valve would improve symptoms (? 4D-CT versus TEE for planimetry).  If her aortic valve stenosis was addressed with replacement, would be able to consider intervention for her atrial fibrillation.   3.  Hypertension Controlled on her current regimen.  4.  Diabetes Continue current regimen      Medication Adjustments/Labs and Tests Ordered: Current medicines are reviewed at length with the patient today.  Concerns regarding medicines are outlined above.  Orders Placed This Encounter  Procedures  . EKG 12-Lead   Meds ordered this encounter  Medications  . metoprolol tartrate (LOPRESSOR) 50 MG tablet    Sig: Take 1 tablet (50 mg total) by mouth 2 (two) times daily.    Dispense:  180 tablet    Refill:  3     Signed, Steffanie Dunn, MD, [facility]  11/27/2019 2:06 PM    Electrophysiology [facility]

## 2019-11-27 NOTE — Patient Instructions (Addendum)
Medication Instructions:  Your physician has recommended you make the following change in your medication:   1.  Increase your metoprolol tartrate- Take 50 mg (2 tablets of your 25 mg) by mouth twice a day  Labwork: None ordered.  Testing/Procedures: Your physician has recommended that you have a Cardioversion (DCCV). Electrical Cardioversion uses a jolt of electricity to your heart either through paddles or wired patches attached to your chest. This is a controlled, usually prescheduled, procedure. Defibrillation is done under light anesthesia in the hospital, and you usually go home the day of the procedure. This is done to get your heart back into a normal rhythm. You are not awake for the procedure. Please see the instruction sheet given to you today.  Follow-Up: Your physician wants you to follow-up in: 6 weeks with Dr. Lalla Brothers.     January 09, 2020 at 11:30 am    CARDIOVERSION INSTRUCTIONS:  COVID TEST-  December 03, 2019 at 8:10 am This is a Drive Up Visit at [address] [place], [place] [address] Someone will direct you to the appropriate testing line. Stay in your car and someone will be with you shortly.  You are scheduled for a Cardioversion on December 05, 2019 at 6:00 am.   Please arrive at the [facility] (Main Entrance A) at [facility]: [address] [place], [place] [address] and proceed to admitting.  DIET: Nothing to eat or drink after midnight except a sip of water with medications (see medication instructions below)  Medication Instructions: TAKE ALL your normal morning medications with a sip of water EXCEPT METFORMIN  You will need to continue your anticoagulant after your procedure until you  are told by your  Provider that it is safe to stop   Labs: on arrival  You must have a responsible person to drive you home and stay in the waiting area during your procedure. Failure to do so could result in cancellation.  Bring your insurance  cards.  *Special Note: Every effort is made to have your procedure done on time. Occasionally there are emergencies that occur at the hospital that may cause delays. Please be patient if a delay does occur.

## 2019-11-27 NOTE — Progress Notes (Signed)
Electrophysiology Office Note:    Date:  11/27/2019   ID:  Briana Foster, Briana Foster 18-Aug-1945, MRN [id number]  PCP:  Briana Housekeeper, MD  [facility] Cardiologist:  Briana Noe, MD  [facility] Electrophysiologist:  None   Referring MD: Briana Records, MD   Chief Complaint: Atrial fibrillation  History of Present Illness:    Briana Foster is a 74 y.o. female who presents for an evaluation of atrial fibrillation at the request of Dr. Katrinka Blazing. Their medical history includes severe aortic stenosis, diabetes, hypertension, hyperlipidemia.  She was last seen by Dr. Katrinka Blazing on October 25.  She had recently undergone a successful cardioversion on October 31, 2019.  After she was back in normal rhythm, she had a improvement in her exercise tolerance.  Over the past week the symptoms of dyspnea with exertion recurred and at the visit on October 25, the EKG showed that she had returned to atrial fibrillation.  She has tolerated Eliquis without bleeding events.  Past Medical History:  Diagnosis Date  . Anemia   . Arthritis 07-20-12   hands  . Asthma   . Back pain   . Cardiomegaly   . Class 1 obesity with serious comorbidity and body mass index (BMI) of 31.0 to 31.9 in adult 03/26/2018  . Depression   . Diabetes (HCC)   . Essential hypertension 03/26/2018  . GERD (gastroesophageal reflux disease)    "comes and goes" - no meds currently  . Heart murmur    told as teenager  . High blood pressure   . History of hypertension 07-20-12   pt stopped BP med several months ago because BP has been normal-MD is aware  . Hyperlipidemia   . Joint pain   . Restless leg syndrome   . Rheumatoid arthritis (HCC)   . Shortness of breath   . Tiredness   . Type 2 diabetes mellitus without complication, without long-term current use of insulin (HCC) 04/10/2018  . Vitamin D deficiency     Past Surgical History:  Procedure Laterality Date  . BUNIONECTOMY  20 yrs ago   bil feet  . BUNIONECTOMY   11/30/2011   Procedure: Arbutus Leas;  Surgeon: Drucilla Schmidt, MD;  Location: [facility];  Service: Orthopedics;  Laterality: Bilateral;  RIGHT FOOT EXCISION OF BUNIONETTE AND PARTIAL   PROXIMAL PHALANGECTOMY OF 5TH TOE LEFT FOOT FUNK BUNIONECTOMY,EXCISION OF BUNIONETTE   . CARDIOVERSION N/A 11/05/2019   Procedure: CARDIOVERSION;  Surgeon: Lewayne Bunting, MD;  Location: [facility] ENDOSCOPY;  Service: Cardiovascular;  Laterality: N/A;  . COLONOSCOPY WITH PROPOFOL N/A 08/07/2012   Procedure: COLONOSCOPY WITH PROPOFOL;  Surgeon: Charolett Bumpers, MD;  Location: [facility] ENDOSCOPY;  Service: Endoscopy;  Laterality: N/A;  . MOUTH SURGERY     teeth extractions  . TONSILLECTOMY    . TUBAL LIGATION    . WRIST SURGERY      Current Medications: Current Meds  Medication Sig  . albuterol (PROVENTIL HFA;VENTOLIN HFA) 108 (90 Base) MCG/ACT inhaler Inhale 2 puffs into the lungs every 6 (six) hours as needed for wheezing or shortness of breath.  Marland Kitchen amiodarone (PACERONE) 200 MG tablet Take 1 tablet (200 mg total) by mouth 2 (two) times daily.  Marland Kitchen amLODipine (NORVASC) 5 MG tablet Take 5 mg by mouth daily.  Marland Kitchen apixaban (ELIQUIS) 5 MG TABS tablet Take 1 tablet (5 mg total) by mouth 2 (two) times daily.  . cholecalciferol (VITAMIN D) 25 MCG (1000 UNIT) tablet Take 1,000 Units by mouth  2 (two) times a week.  . Cyanocobalamin (VITAMIN B-12) 2500 MCG SUBL Place 2,500 mcg under the tongue 3 (three) times a week.  . diltiazem (CARDIZEM CD) 180 MG 24 hr capsule Take 1 capsule (180 mg total) by mouth daily.  . DULoxetine (CYMBALTA) 30 MG capsule Take 30 mg by mouth 2 (two) times a week.   . metFORMIN (GLUCOPHAGE) 850 MG tablet Take 850 mg by mouth 2 (two) times daily.  . methocarbamol (ROBAXIN) 500 MG tablet Take 500 mg by mouth 3 (three) times daily as needed for muscle spasms.   . pantoprazole (PROTONIX) 40 MG tablet Take 40 mg by mouth daily as needed (upset stomach).   . rOPINIRole (REQUIP) 4 MG tablet Take 4 mg by mouth at  bedtime.  . rosuvastatin (CRESTOR) 20 MG tablet Take 20 mg by mouth daily.  . Vitamin D, Ergocalciferol, (DRISDOL) 1.25 MG (50000 UT) CAPS capsule Take 1 capsule (50,000 Units total) by mouth every 7 (seven) days.  . [DISCONTINUED] metoprolol tartrate (LOPRESSOR) 25 MG tablet Take 1 tablet (25 mg total) by mouth 2 (two) times daily.     Allergies:   Bee venom and Apricot flavor   Social History   Socioeconomic History  . Marital status: Widowed    Spouse name: Not on file  . Number of children: Not on file  . Years of education: Not on file  . Highest education level: Not on file  Occupational History  . Occupation: Retired  Tobacco Use  . Smoking status: Never Smoker  . Smokeless tobacco: Never Used  Substance and Sexual Activity  . Alcohol use: No  . Drug use: No  . Sexual activity: Not Currently  Other Topics Concern  . Not on file  Social History Narrative  . Not on file   Social Determinants of Health   Financial Resource Strain:   . Difficulty of Paying Living Expenses: Not on file  Food Insecurity:   . Worried About Running Out of Food in the Last Year: Not on file  . Ran Out of Food in the Last Year: Not on file  Transportation Needs:   . Lack of Transportation (Medical): Not on file  . Lack of Transportation (Non-Medical): Not on file  Physical Activity:   . Days of Exercise per Week: Not on file  . Minutes of Exercise per Session: Not on file  Stress:   . Feeling of Stress : Not on file  Social Connections:   . Frequency of Communication with Friends and Family: Not on file  . Frequency of Social Gatherings with Friends and Family: Not on file  . Attends Religious Services: Not on file  . Active Member of Clubs or Organizations: Not on file  . Attends Club or Organization Meetings: Not on file  . Marital Status: Not on file     Family History: The patient's family history includes Cancer in her father; Heart disease in her mother; Stroke in her  mother.  ROS:   Please see the history of present illness.    All other systems reviewed and are negative.  EKGs/Labs/Other Studies Reviewed:    The following studies were reviewed today: Prior notes, echo   October 02, 2019 echo personally reviewed Left ventricular function mildly decreased, 45% Mild left ventricular hypertrophy Right ventricular function normal Likely low flow low gradient severe aortic stenosis False tendon within the left ventricle  November 25, 2019 EKG personally reviewed shows atrial fibrillation with a ventricular to 121 bpm     EKG:  The ekg ordered today demonstrates atrial fibrillation with rapid ventricular response.  Ventricular rate 141 bpm  Recent Labs: 10/31/2019: BUN 16; Creatinine, Ser 0.86; Hemoglobin 14.5; Platelets 254; Potassium 4.8; Sodium 138  Recent Lipid Panel No results found for: CHOL, TRIG, HDL, CHOLHDL, VLDL, LDLCALC, LDLDIRECT  Physical Exam:    VS:  BP 120/84   Pulse (!) 141   Ht 5' 7.5" (1.715 m)   Wt 203 lb (92.1 kg)   SpO2 98%   BMI 31.33 kg/m     Wt Readings from Last 3 Encounters:  11/27/19 203 lb (92.1 kg)  11/25/19 205 lb (93 kg)  11/05/19 195 lb (88.5 kg)     GEN:  Well nourished, well developed in no acute distress HEENT: Normal NECK: No JVD; No carotid bruits LYMPHATICS: No lymphadenopathy CARDIAC: Irregularly irregular, 3 out of 6 systolic crescendo decrescendo murmur RESPIRATORY:  Clear to auscultation without rales, wheezing or rhonchi  ABDOMEN: Soft, non-tender, non-distended MUSCULOSKELETAL:  No edema; No deformity  SKIN: Warm and dry NEUROLOGIC:  Alert and oriented x 3 PSYCHIATRIC:  Normal affect   ASSESSMENT:    1. Persistent atrial fibrillation (HCC)   2. Nonrheumatic aortic valve stenosis   3. Essential hypertension   4. Type 2 diabetes mellitus without complication, without long-term current use of insulin (HCC)    PLAN:    In order of problems listed above:  1. Persistent atrial  fibrillation CHA2DS2-VASc of 4 on Eliquis.  Poorly rate controlled.  Failed prior cardioversion.  Now started on amiodarone.  She is tolerating amiodarone without off target effect.  Plan to continue amiodarone and perform cardioversion next week to see if she maintains normal rhythm.  In the meantime, I will increase her metoprolol to 50 mg twice daily in addition to her diltiazem. Follow-up in 6 weeks.   2.  Aortic valve stenosis Echo findings concerning for severe low-flow low gradient aortic stenosis.  Valve is heavily calcified with significantly reduced motion.  Unfortunately the severity of her aortic valve stenosis limits my ability to safely ablate her atrial fibrillation.  Recommend additional work-up for her aortic valve to determine whether or not replacement of the valve would improve symptoms (? 4D-CT versus TEE for planimetry).  If her aortic valve stenosis was addressed with replacement, would be able to consider intervention for her atrial fibrillation.   3.  Hypertension Controlled on her current regimen.  4.  Diabetes Continue current regimen      Medication Adjustments/Labs and Tests Ordered: Current medicines are reviewed at length with the patient today.  Concerns regarding medicines are outlined above.  Orders Placed This Encounter  Procedures  . EKG 12-Lead   Meds ordered this encounter  Medications  . metoprolol tartrate (LOPRESSOR) 50 MG tablet    Sig: Take 1 tablet (50 mg total) by mouth 2 (two) times daily.    Dispense:  180 tablet    Refill:  3     Signed, Steffanie Dunn, MD, [facility]  11/27/2019 2:06 PM    Electrophysiology [facility]

## 2019-11-28 ENCOUNTER — Telehealth: Payer: Self-pay | Admitting: Interventional Cardiology

## 2019-11-28 MED ORDER — DILTIAZEM HCL ER COATED BEADS 180 MG PO CP24
180.0000 mg | ORAL_CAPSULE | Freq: Every day | ORAL | 3 refills | Status: DC
Start: 2019-11-28 — End: 2020-02-25

## 2019-11-28 MED ORDER — METOPROLOL TARTRATE 25 MG PO TABS: 25.0000 mg | ORAL_TABLET | Freq: Two times a day (BID) | ORAL | 3 refills | Status: DC

## 2019-11-28 NOTE — Telephone Encounter (Signed)
Returned call to Pt.  Per Pt when she got home she checked her medications and she had never started metoprolol tartrate 25 mg bid.  Dr. Lalla Brothers had advised Pt to increase metoprolol at OV 10/27 thinking she was already on metoprolol.  Advised Pt that we will start her on the metoprolol tartrate 25 mg PO BID.  Also will refill diltiazem per Pt request.

## 2019-11-28 NOTE — Telephone Encounter (Signed)
Medication increased to 50mg  BID yesterday by Dr.Lambert.  Will route to Dr. nurse.

## 2019-11-28 NOTE — Telephone Encounter (Signed)
Briana Foster is calling request a call from [place]. She states it's in regards to metoprolol tartrate (LOPRESSOR) 50 MG tablet. She states she does not have this medication so she has not been taking it. I asked if she was needing a refill and she stated "No". She did not go into any further detail. Please advise.

## 2019-12-01 NOTE — Progress Notes (Deleted)
Cardiology Office Note:    Date:  12/01/2019   ID:  Briana Foster, DOB 12-Apr-1945, MRN [id number]  PCP:  Georgann Housekeeper, MD  Cardiologist:  Lesleigh Noe, MD   Referring MD: Georgann Housekeeper, MD   No chief complaint on file.   History of Present Illness:    Briana Foster is a 74 y.o. female with a hx of hypertension, DM 2, cardiomegaly, chest pain evaluation(no recurrence) , andsevere aortic stenosis (10/202021), now being managed for new atrial fibrillationand loading amiodarone.  Underwent successful electrical cardioversion on 10/31/2019 to normal sinus rhythm.  She never started metoprolol as recommended by me at the last visit. Dr. Lalla Brothers then recommended up titration. ***  Past Medical History:  Diagnosis Date  . Anemia   . Arthritis 07-20-12   hands  . Asthma   . Back pain   . Cardiomegaly   . Class 1 obesity with serious comorbidity and body mass index (BMI) of 31.0 to 31.9 in adult 03/26/2018  . Depression   . Diabetes (HCC)   . Essential hypertension 03/26/2018  . GERD (gastroesophageal reflux disease)    "comes and goes" - no meds currently  . Heart murmur    told as teenager  . High blood pressure   . History of hypertension 07-20-12   pt stopped BP med several months ago because BP has been normal-MD is aware  . Hyperlipidemia   . Joint pain   . Restless leg syndrome   . Rheumatoid arthritis (HCC)   . Shortness of breath   . Tiredness   . Type 2 diabetes mellitus without complication, without long-term current use of insulin (HCC) 04/10/2018  . Vitamin D deficiency     Past Surgical History:  Procedure Laterality Date  . BUNIONECTOMY  20 yrs ago   bil feet  . BUNIONECTOMY  11/30/2011   Procedure: Arbutus Leas;  Surgeon: Drucilla Schmidt, MD;  Location: [facility];  Service: Orthopedics;  Laterality: Bilateral;  RIGHT FOOT EXCISION OF BUNIONETTE AND PARTIAL   PROXIMAL PHALANGECTOMY OF 5TH TOE LEFT FOOT FUNK BUNIONECTOMY,EXCISION OF BUNIONETTE     . CARDIOVERSION N/A 11/05/2019   Procedure: CARDIOVERSION;  Surgeon: Lewayne Bunting, MD;  Location: [facility] ENDOSCOPY;  Service: Cardiovascular;  Laterality: N/A;  . COLONOSCOPY WITH PROPOFOL N/A 08/07/2012   Procedure: COLONOSCOPY WITH PROPOFOL;  Surgeon: Charolett Bumpers, MD;  Location: [facility] ENDOSCOPY;  Service: Endoscopy;  Laterality: N/A;  . MOUTH SURGERY     teeth extractions  . TONSILLECTOMY    . TUBAL LIGATION    . WRIST SURGERY      Current Medications: No outpatient medications have been marked as taking for the 12/02/19 encounter (Appointment) with Lyn Records, MD.     Allergies:   Bee venom and Apricot flavor   Social History   Socioeconomic History  . Marital status: Widowed    Spouse name: Not on file  . Number of children: Not on file  . Years of education: Not on file  . Highest education level: Not on file  Occupational History  . Occupation: Retired  Tobacco Use  . Smoking status: Never Smoker  . Smokeless tobacco: Never Used  Substance and Sexual Activity  . Alcohol use: No  . Drug use: No  . Sexual activity: Not Currently  Other Topics Concern  . Not on file  Social History Narrative  . Not on file   Social Determinants of Health   Financial Resource Strain:   .  Difficulty of Paying Living Expenses: Not on file  Food Insecurity:   . Worried About Programme researcher, broadcasting/film/video in the Last Year: Not on file  . Ran Out of Food in the Last Year: Not on file  Transportation Needs:   . Lack of Transportation (Medical): Not on file  . Lack of Transportation (Non-Medical): Not on file  Physical Activity:   . Days of Exercise per Week: Not on file  . Minutes of Exercise per Session: Not on file  Stress:   . Feeling of Stress : Not on file  Social Connections:   . Frequency of Communication with Friends and Family: Not on file  . Frequency of Social Gatherings with Friends and Family: Not on file  . Attends Religious Services: Not on file  . Active Member of  Clubs or Organizations: Not on file  . Attends Banker Meetings: Not on file  . Marital Status: Not on file     Family History: The patient's family history includes Cancer in her father; Heart disease in her mother; Stroke in her mother.  ROS:   Please see the history of present illness.    *** All other systems reviewed and are negative.  EKGs/Labs/Other Studies Reviewed:    The following studies were reviewed today: ***  EKG:  EKG ***  Recent Labs: 10/31/2019: BUN 16; Creatinine, Ser 0.86; Hemoglobin 14.5; Platelets 254; Potassium 4.8; Sodium 138  Recent Lipid Panel No results found for: CHOL, TRIG, HDL, CHOLHDL, VLDL, LDLCALC, LDLDIRECT  Physical Exam:    VS:  There were no vitals taken for this visit.    Wt Readings from Last 3 Encounters:  11/27/19 203 lb (92.1 kg)  11/25/19 205 lb (93 kg)  11/05/19 195 lb (88.5 kg)     GEN: ***. No acute distress HEENT: Normal NECK: No JVD. LYMPHATICS: No lymphadenopathy CARDIAC: *** RRR without murmur, gallop, or edema. VASCULAR: *** Normal Pulses. No bruits. RESPIRATORY:  Clear to auscultation without rales, wheezing or rhonchi  ABDOMEN: Soft, non-tender, non-distended, No pulsatile mass, MUSCULOSKELETAL: No deformity  SKIN: Warm and dry NEUROLOGIC:  Alert and oriented x 3 PSYCHIATRIC:  Normal affect   ASSESSMENT:    1. Persistent atrial fibrillation (HCC)   2. Nonrheumatic aortic valve stenosis   3. Essential hypertension   4. Type 2 diabetes mellitus without complication, without long-term current use of insulin (HCC)   5. Hyperlipidemia LDL goal <70   6. Educated about COVID-19 virus infection    PLAN:    In order of problems listed above:  1. ***   Medication Adjustments/Labs and Tests Ordered: Current medicines are reviewed at length with the patient today.  Concerns regarding medicines are outlined above.  No orders of the defined types were placed in this encounter.  No orders of the  defined types were placed in this encounter.   There are no Patient Instructions on file for this visit.   Signed, Lesleigh Noe, MD  12/01/2019 2:42 PM    [facility]

## 2019-12-02 ENCOUNTER — Ambulatory Visit: Payer: Medicare Other | Admitting: Interventional Cardiology

## 2019-12-02 DIAGNOSIS — I10 Essential (primary) hypertension: Secondary | ICD-10-CM

## 2019-12-02 DIAGNOSIS — I4819 Other persistent atrial fibrillation: Secondary | ICD-10-CM

## 2019-12-02 DIAGNOSIS — E785 Hyperlipidemia, unspecified: Secondary | ICD-10-CM

## 2019-12-02 DIAGNOSIS — I350 Nonrheumatic aortic (valve) stenosis: Secondary | ICD-10-CM

## 2019-12-02 DIAGNOSIS — Z7189 Other specified counseling: Secondary | ICD-10-CM

## 2019-12-02 DIAGNOSIS — E119 Type 2 diabetes mellitus without complications: Secondary | ICD-10-CM

## 2019-12-03 ENCOUNTER — Other Ambulatory Visit (HOSPITAL_COMMUNITY)
Admission: RE | Admit: 2019-12-03 | Discharge: 2019-12-03 | Disposition: A | Discharge status: Home / Self Care | Payer: Medicare Other | Source: Ambulatory Visit | Attending: Internal Medicine | Admitting: Internal Medicine

## 2019-12-03 DIAGNOSIS — Z20822 Contact with and (suspected) exposure to covid-19: Secondary | ICD-10-CM | POA: Insufficient documentation

## 2019-12-03 DIAGNOSIS — Z01812 Encounter for preprocedural laboratory examination: Secondary | ICD-10-CM | POA: Diagnosis present

## 2019-12-03 LAB — SARS CORONAVIRUS 2 (TAT 6-24 HRS): SARS Coronavirus 2: NEGATIVE

## 2019-12-05 ENCOUNTER — Encounter (HOSPITAL_COMMUNITY): Payer: Self-pay | Admitting: Internal Medicine

## 2019-12-05 ENCOUNTER — Encounter (HOSPITAL_COMMUNITY)
Admission: RE | Disposition: A | Discharge status: Home / Self Care | Payer: Self-pay | Source: Home / Self Care | Attending: Internal Medicine

## 2019-12-05 ENCOUNTER — Other Ambulatory Visit: Payer: Self-pay

## 2019-12-05 ENCOUNTER — Ambulatory Visit (HOSPITAL_COMMUNITY): Payer: Medicare Other | Admitting: Anesthesiology

## 2019-12-05 ENCOUNTER — Ambulatory Visit (HOSPITAL_COMMUNITY)
Admission: RE | Admit: 2019-12-05 | Discharge: 2019-12-05 | Disposition: A | Discharge status: Home / Self Care | Payer: Medicare Other | Attending: Internal Medicine | Admitting: Internal Medicine

## 2019-12-05 DIAGNOSIS — E119 Type 2 diabetes mellitus without complications: Secondary | ICD-10-CM | POA: Diagnosis not present

## 2019-12-05 DIAGNOSIS — E785 Hyperlipidemia, unspecified: Secondary | ICD-10-CM | POA: Diagnosis not present

## 2019-12-05 DIAGNOSIS — I350 Nonrheumatic aortic (valve) stenosis: Secondary | ICD-10-CM | POA: Diagnosis not present

## 2019-12-05 DIAGNOSIS — I10 Essential (primary) hypertension: Secondary | ICD-10-CM | POA: Diagnosis not present

## 2019-12-05 DIAGNOSIS — I4819 Other persistent atrial fibrillation: Secondary | ICD-10-CM | POA: Diagnosis not present

## 2019-12-05 HISTORY — PX: CARDIOVERSION: SHX1299

## 2019-12-05 LAB — POCT I-STAT, CHEM 8
BUN: 21 mg/dL (ref 8–23)
Calcium, Ion: 1.24 mmol/L (ref 1.15–1.40)
Chloride: 103 mmol/L (ref 98–111)
Creatinine, Ser: 0.9 mg/dL (ref 0.44–1.00)
Glucose, Bld: 147 mg/dL — ABNORMAL HIGH (ref 70–99)
HCT: 40 % (ref 36.0–46.0)
Hemoglobin: 13.6 g/dL (ref 12.0–15.0)
Potassium: 3.8 mmol/L (ref 3.5–5.1)
Sodium: 139 mmol/L (ref 135–145)
TCO2: 24 mmol/L (ref 22–32)

## 2019-12-05 SURGERY — CARDIOVERSION
Anesthesia: General

## 2019-12-05 MED ORDER — SODIUM CHLORIDE 0.9 % IV SOLN: INTRAVENOUS | Status: DC

## 2019-12-05 MED ORDER — LACTATED RINGERS IV SOLN: INTRAVENOUS | Status: DC

## 2019-12-05 MED ORDER — LIDOCAINE 2% (20 MG/ML) 5 ML SYRINGE
INTRAMUSCULAR | Status: DC | PRN
  Administered 2019-12-05: 60 mg via INTRAVENOUS

## 2019-12-05 MED ORDER — PROPOFOL 10 MG/ML IV BOLUS
INTRAVENOUS | Status: DC | PRN
  Administered 2019-12-05: 40 mg via INTRAVENOUS
  Administered 2019-12-05: 30 mg via INTRAVENOUS
  Administered 2019-12-05: 60 mg via INTRAVENOUS

## 2019-12-05 NOTE — CV Procedure (Signed)
Procedure: Electrical Cardioversion Indications:  Atrial Fibrillation  Procedure Details:  Consent: Risks of procedure as well as the alternatives and risks of each were explained to the (patient/caregiver).  Consent for procedure obtained.  Time Out: Verified patient identification, verified procedure, site/side was marked, verified correct patient position, special equipment/implants available, medications/allergies/relevent history reviewed, required imaging and test results available. PERFORMED.  Patient placed on cardiac monitor, pulse oximetry, supplemental oxygen as necessary.  Sedation given: propofol per anesthesia  Olivia CRNA and Dr. Chaney Malling Pacer pads placed anterior and posterior chest.  Cardioverted 3 time(s).  Cardioversion with synchronized biphasic 120J, 200J, 200J shock.  Evaluation: Findings: Post procedure EKG shows: NSR Complications: None Patient did tolerate procedure well.  Time Spent Directly with the Patient:  30 minutes   Parke Poisson 12/05/2019, 7:44 AM

## 2019-12-05 NOTE — Anesthesia Postprocedure Evaluation (Signed)
Anesthesia Post Note  Patient: Briana Foster  Procedure(s) Performed: CARDIOVERSION (N/A )     Patient location during evaluation: Endoscopy Anesthesia Type: General Level of consciousness: awake and alert Pain management: pain level controlled Vital Signs Assessment: post-procedure vital signs reviewed and stable Respiratory status: spontaneous breathing, nonlabored ventilation, respiratory function stable and patient connected to nasal cannula oxygen Cardiovascular status: blood pressure returned to baseline and stable Postop Assessment: no apparent nausea or vomiting Anesthetic complications: no   No complications documented.  Last Vitals:  Vitals:   12/05/19 0800 12/05/19 0810  BP: 102/75 133/77  Pulse: 81 84  Resp: 20 18  Temp:    SpO2: 95% 94%    Last Pain:  Vitals:   12/05/19 0800  TempSrc:   PainSc: 0-No pain                 Lucindy Borel S

## 2019-12-05 NOTE — Anesthesia Procedure Notes (Signed)
Procedure Name: General with mask airway Date/Time: 12/05/2019 7:36 AM Performed by: Colon Flattery, CRNA Pre-anesthesia Checklist: Patient identified, Emergency Drugs available, Suction available and Patient being monitored Patient Re-evaluated:Patient Re-evaluated prior to induction Oxygen Delivery Method: Ambu bag Preoxygenation: Pre-oxygenation with 100% oxygen Induction Type: IV induction Placement Confirmation: positive ETCO2 Dental Injury: Teeth and Oropharynx as per pre-operative assessment

## 2019-12-05 NOTE — Anesthesia Preprocedure Evaluation (Signed)
Anesthesia Evaluation  Patient identified by MRN, date of birth, ID band Patient awake    Reviewed: Allergy & Precautions, H&P , NPO status , Patient's Chart, lab work & pertinent test results  Airway Mallampati: II   Neck ROM: full    Dental   Pulmonary shortness of breath, asthma ,    breath sounds clear to auscultation       Cardiovascular hypertension, + dysrhythmias Atrial Fibrillation  Rhythm:irregular Rate:Normal     Neuro/Psych PSYCHIATRIC DISORDERS Depression    GI/Hepatic GERD  ,  Endo/Other  diabetes, Type 2  Renal/GU      Musculoskeletal  (+) Arthritis ,   Abdominal   Peds  Hematology   Anesthesia Other Findings   Reproductive/Obstetrics                             Anesthesia Physical Anesthesia Plan  ASA: III  Anesthesia Plan: General   Post-op Pain Management:    Induction: Intravenous  PONV Risk Score and Plan: 3 and Propofol infusion and Treatment may vary due to age or medical condition  Airway Management Planned: Mask  Additional Equipment:   Intra-op Plan:   Post-operative Plan:   Informed Consent: I have reviewed the patients History and Physical, chart, labs and discussed the procedure including the risks, benefits and alternatives for the proposed anesthesia with the patient or authorized representative who has indicated his/her understanding and acceptance.       Plan Discussed with: CRNA, Anesthesiologist and Surgeon  Anesthesia Plan Comments:         Anesthesia Quick Evaluation

## 2019-12-05 NOTE — Transfer of Care (Addendum)
Immediate Anesthesia Transfer of Care Note  Patient: Briana Foster  Procedure(s) Performed: CARDIOVERSION (N/A )  Patient Location: Endoscopy Unit  Anesthesia Type:General  Level of Consciousness: awake, alert  and oriented  Airway & Oxygen Therapy: Patient Spontanous Breathing and Patient connected to nasal cannula oxygen  Post-op Assessment: Report given to RN, Post -op Vital signs reviewed and stable and Patient moving all extremities  Post vital signs: Reviewed and stable  Last Vitals:  Vitals Value Taken Time  BP    Temp    Pulse    Resp    SpO2      Last Pain:  Vitals:   12/05/19 0714  TempSrc: Oral  PainSc: 0-No pain         Complications: No complications documented.

## 2019-12-05 NOTE — Discharge Instructions (Signed)

## 2019-12-05 NOTE — Interval H&P Note (Signed)
History and Physical Interval Note:  12/05/2019 7:32 AM  Briana Foster  has presented today for surgery, with the diagnosis of AFIB.  The various methods of treatment have been discussed with the patient and family. After consideration of risks, benefits and other options for treatment, the patient has consented to  Procedure(s): CARDIOVERSION (N/A) as a surgical intervention.  The patient's history has been reviewed, patient examined, no change in status, stable for surgery.  I have reviewed the patient's chart and labs.  Questions were answered to the patient's satisfaction.     Parke Poisson

## 2019-12-09 ENCOUNTER — Encounter (HOSPITAL_COMMUNITY): Payer: Self-pay | Admitting: Internal Medicine

## 2020-01-02 ENCOUNTER — Encounter: Payer: Self-pay | Admitting: Cardiology

## 2020-01-02 ENCOUNTER — Other Ambulatory Visit: Payer: Medicare Other

## 2020-01-02 ENCOUNTER — Ambulatory Visit: Payer: Medicare Other | Admitting: Cardiology

## 2020-01-02 ENCOUNTER — Other Ambulatory Visit: Payer: Self-pay

## 2020-01-02 VITALS — BP 132/84 | HR 123 | Ht 67.5 in | Wt 206.8 lb

## 2020-01-02 DIAGNOSIS — I350 Nonrheumatic aortic (valve) stenosis: Secondary | ICD-10-CM | POA: Diagnosis not present

## 2020-01-02 DIAGNOSIS — E119 Type 2 diabetes mellitus without complications: Secondary | ICD-10-CM | POA: Diagnosis not present

## 2020-01-02 DIAGNOSIS — I4821 Permanent atrial fibrillation: Secondary | ICD-10-CM

## 2020-01-02 DIAGNOSIS — I10 Essential (primary) hypertension: Secondary | ICD-10-CM | POA: Diagnosis not present

## 2020-01-02 MED ORDER — METOPROLOL TARTRATE 50 MG PO TABS: 75.0000 mg | ORAL_TABLET | Freq: Two times a day (BID) | ORAL | 3 refills | Status: DC

## 2020-01-02 NOTE — Progress Notes (Signed)
Electrophysiology Office Follow up Visit Note:    Date:  01/02/2020   ID:  Briana Foster, DOB [birth year], MRN [id number]  PCP:  Georgann Housekeeper, MD  [facility] Cardiologist:  Lesleigh Noe, MD  [facility] Electrophysiologist:  Lanier Prude, MD    Interval History:    Briana Foster is a 74 y.o. female who presents for a follow up visit.  I last saw the patient on November 27, 2019.  At that appointment, she was started on amiodarone and plans were made to cardiovert her.  She presented to the hospital on December 05, 2019 for cardioversion.  Unfortunately she returned to atrial fibrillation shortly after the cardioversion.  Complicating her management is ongoing nausea and vomiting.  She tells me that she was waking up 4-5 nights per week and vomiting.  She was keeping a trash can by her bed at night.  She was concerned that in excess of medications was leading to her nausea and vomiting.  She recently made the decision to stop every medication except for Eliquis, Cardizem, Lopressor.  Since stopping the other medications her nausea and vomiting have improved and she is sleeping through the night.  She tells me that she had taken the amiodarone up until the cardioversion and when she went back in atrial fibrillation she stopped taking the amiodarone.     Past Medical History:  Diagnosis Date  . Anemia   . Arthritis 07-20-12   hands  . Asthma   . Back pain   . Cardiomegaly   . Class 1 obesity with serious comorbidity and body mass index (BMI) of 31.0 to 31.9 in adult 03/26/2018  . Depression   . Diabetes (HCC)   . Essential hypertension 03/26/2018  . GERD (gastroesophageal reflux disease)    "comes and goes" - no meds currently  . Heart murmur    told as teenager  . High blood pressure   . History of hypertension 07-20-12   pt stopped BP med several months ago because BP has been normal-MD is aware  . Hyperlipidemia   . Joint pain   . Restless leg syndrome   .  Rheumatoid arthritis (HCC)   . Shortness of breath   . Tiredness   . Type 2 diabetes mellitus without complication, without long-term current use of insulin (HCC) 04/10/2018  . Vitamin D deficiency     Past Surgical History:  Procedure Laterality Date  . BUNIONECTOMY  20 yrs ago   bil feet  . BUNIONECTOMY  11/30/2011   Procedure: Arbutus Leas;  Surgeon: Drucilla Schmidt, MD;  Location: [facility] ORS;  Service: Orthopedics;  Laterality: Bilateral;  RIGHT FOOT EXCISION OF BUNIONETTE AND PARTIAL   PROXIMAL PHALANGECTOMY OF 5TH TOE LEFT FOOT FUNK BUNIONECTOMY,EXCISION OF BUNIONETTE   . CARDIOVERSION N/A 11/05/2019   Procedure: CARDIOVERSION;  Surgeon: Lewayne Bunting, MD;  Location: [facility] ENDOSCOPY;  Service: Cardiovascular;  Laterality: N/A;  . CARDIOVERSION N/A 12/05/2019   Procedure: CARDIOVERSION;  Surgeon: Parke Poisson, MD;  Location: [facility] ENDOSCOPY;  Service: Cardiovascular;  Laterality: N/A;  . COLONOSCOPY WITH PROPOFOL N/A 08/07/2012   Procedure: COLONOSCOPY WITH PROPOFOL;  Surgeon: Charolett Bumpers, MD;  Location: [facility] ENDOSCOPY;  Service: Endoscopy;  Laterality: N/A;  . MOUTH SURGERY     teeth extractions  . TONSILLECTOMY    . TUBAL LIGATION    . WRIST SURGERY      Current Medications: Current Meds  Medication Sig  . apixaban (ELIQUIS) 5 MG TABS  tablet Take 1 tablet (5 mg total) by mouth 2 (two) times daily.  Marland Kitchen diltiazem (CARDIZEM CD) 180 MG 24 hr capsule Take 1 capsule (180 mg total) by mouth daily. (Patient taking differently: Take 180 mg by mouth in the morning and at bedtime. )  . [DISCONTINUED] albuterol (PROVENTIL HFA;VENTOLIN HFA) 108 (90 Base) MCG/ACT inhaler Inhale 2 puffs into the lungs every 6 (six) hours as needed for wheezing or shortness of breath.  . [DISCONTINUED] amiodarone (PACERONE) 200 MG tablet Take 1 tablet (200 mg total) by mouth 2 (two) times daily.  . [DISCONTINUED] amLODipine (NORVASC) 5 MG tablet Take 5 mg by mouth daily.  . [DISCONTINUED] cholecalciferol  (VITAMIN D) 25 MCG (1000 UNIT) tablet Take 1,000 Units by mouth 2 (two) times a week.  . [DISCONTINUED] Cyanocobalamin (VITAMIN B-12) 2500 MCG SUBL Place 2,500 mcg under the tongue 3 (three) times a week.  . [DISCONTINUED] DULoxetine (CYMBALTA) 30 MG capsule Take 30 mg by mouth 2 (two) times a week.   . [DISCONTINUED] metFORMIN (GLUCOPHAGE) 850 MG tablet Take 850 mg by mouth 2 (two) times daily.  . [DISCONTINUED] methocarbamol (ROBAXIN) 500 MG tablet Take 500 mg by mouth 3 (three) times daily as needed for muscle spasms.   . [DISCONTINUED] metoprolol tartrate (LOPRESSOR) 25 MG tablet Take 1 tablet (25 mg total) by mouth 2 (two) times daily.  . [DISCONTINUED] pantoprazole (PROTONIX) 40 MG tablet Take 40 mg by mouth daily as needed (upset stomach).   . [DISCONTINUED] rOPINIRole (REQUIP) 4 MG tablet Take 4 mg by mouth at bedtime.  . [DISCONTINUED] rosuvastatin (CRESTOR) 20 MG tablet Take 20 mg by mouth daily.  . [DISCONTINUED] Vitamin D, Ergocalciferol, (DRISDOL) 1.25 MG (50000 UT) CAPS capsule Take 1 capsule (50,000 Units total) by mouth every 7 (seven) days.     Allergies:   Bee venom and Apricot flavor   Social History   Socioeconomic History  . Marital status: Widowed    Spouse name: Not on file  . Number of children: Not on file  . Years of education: Not on file  . Highest education level: Not on file  Occupational History  . Occupation: Retired  Tobacco Use  . Smoking status: Never Smoker  . Smokeless tobacco: Never Used  Substance and Sexual Activity  . Alcohol use: No  . Drug use: No  . Sexual activity: Not Currently  Other Topics Concern  . Not on file  Social History Narrative  . Not on file   Social Determinants of Health   Financial Resource Strain:   . Difficulty of Paying Living Expenses: Not on file  Food Insecurity:   . Worried About Programme researcher, broadcasting/film/video in the Last Year: Not on file  . Ran Out of Food in the Last Year: Not on file  Transportation Needs:   .  Lack of Transportation (Medical): Not on file  . Lack of Transportation (Non-Medical): Not on file  Physical Activity:   . Days of Exercise per Week: Not on file  . Minutes of Exercise per Session: Not on file  Stress:   . Feeling of Stress : Not on file  Social Connections:   . Frequency of Communication with Friends and Family: Not on file  . Frequency of Social Gatherings with Friends and Family: Not on file  . Attends Religious Services: Not on file  . Active Member of Clubs or Organizations: Not on file  . Attends Banker Meetings: Not on file  . Marital Status: Not  on file     Family History: The patient's family history includes Cancer in her father; Heart disease in her mother; Stroke in her mother.  ROS:   Please see the history of present illness.    All other systems reviewed and are negative.  EKGs/Labs/Other Studies Reviewed:    The following studies were reviewed today: Cardioversion records, prior notes  EKG:  The ekg ordered today demonstrates atrial fibrillation/flutter with a heart rate of 123 bpm.  Recent Labs: 10/31/2019: Platelets 254 12/05/2019: BUN 21; Creatinine, Ser 0.90; Hemoglobin 13.6; Potassium 3.8; Sodium 139  Recent Lipid Panel No results found for: CHOL, TRIG, HDL, CHOLHDL, VLDL, LDLCALC, LDLDIRECT  Physical Exam:    VS:  BP 132/84   Pulse (!) 123   Ht 5' 7.5" (1.715 m)   Wt 206 lb 12.8 oz (93.8 kg)   SpO2 94%   BMI 31.91 kg/m     Wt Readings from Last 3 Encounters:  01/02/20 206 lb 12.8 oz (93.8 kg)  11/27/19 203 lb (92.1 kg)  11/25/19 205 lb (93 kg)     GEN: Well nourished, well developed in no acute distress HEENT: Normal NECK: No JVD; No carotid bruits LYMPHATICS: No lymphadenopathy CARDIAC: Irregularly irregular, tachycardic, no murmurs, rubs, gallops RESPIRATORY:  Clear to auscultation without rales, wheezing or rhonchi  ABDOMEN: Soft, non-tender, non-distended MUSCULOSKELETAL:  No edema; No deformity  SKIN:  Warm and dry NEUROLOGIC:  Alert and oriented x 3 PSYCHIATRIC:  Normal affect   ASSESSMENT:    1. Permanent atrial fibrillation (HCC)   2. Aortic valve stenosis, etiology of cardiac valve disease unspecified   3. Essential hypertension   4. Type 2 diabetes mellitus without complication, without long-term current use of insulin (HCC)    PLAN:    In order of problems listed above:  1. Permanent atrial fibrillation Patient is now in permanent atrial fibrillation.  She has failed cardioversion after loading with amiodarone.  The amiodarone has now been stopped given nausea and vomiting along with multiple other medications.  She is currently taking Eliquis for stroke prophylaxis.  She is also taking Cardizem 180 mg once daily and metoprolol tartrate 25 mg twice daily.  Ideally I would like to get her rates under better control.  If we are unable to improve her heart rate control, will discuss implantation of a permanent pacemaker and ablation of the AV junction.  First, I would like to increase her metoprolol to 50 mg twice daily for 3 days and then further increase it to 75 mg twice daily.  I will plan on seeing her back in 6 to 8 weeks to assess response to therapy.  2.  Aortic stenosis I reviewed her echocardiogram with the TAVR team.  Her aortic stenosis appears to be moderate and will require ongoing surveillance.  3.  Hypertension At goal during today's visit despite being off of the amlodipine.     Medication Adjustments/Labs and Tests Ordered: Current medicines are reviewed at length with the patient today.  Concerns regarding medicines are outlined above.  Orders Placed This Encounter  Procedures  . EKG 12-Lead   Meds ordered this encounter  Medications  . metoprolol tartrate (LOPRESSOR) 50 MG tablet    Sig: Take 1.5 tablets (75 mg total) by mouth 2 (two) times daily.    Dispense:  270 tablet    Refill:  3     Signed, Steffanie Dunn, MD, [facility]  01/02/2020 5:10 PM      Electrophysiology [facility] 

## 2020-01-02 NOTE — Patient Instructions (Addendum)
Medication Instructions:  Your physician has recommended you make the following change in your medication:   1.  INCREASE your metoprolol tartrate---Take 2 tablets (50 mg) by mouth twice a day for 3-4 days.  IF you feel ok after 3 or 4 days-- INCREASE your metoprolol AGAIN--Take 75 mg (1.5 tablets of your new prescription) by mouth twice a day.  Labwork: None ordered.  Testing/Procedures: None ordered.  Follow-Up: Your physician wants you to follow-up in: 6-8 weeks with Dr. Lalla Brothers.    February 13, 2020 at 11:30 am at the [facility] office    Any Other Special Instructions Will Be Listed Below (If Applicable).  If you need a refill on your cardiac medications before your next appointment, please call your pharmacy.

## 2020-01-09 ENCOUNTER — Ambulatory Visit: Payer: Medicare Other | Admitting: Cardiology

## 2020-02-07 DIAGNOSIS — Z20822 Contact with and (suspected) exposure to covid-19: Secondary | ICD-10-CM | POA: Diagnosis not present

## 2020-02-13 ENCOUNTER — Ambulatory Visit: Payer: Medicare Other | Admitting: Cardiology

## 2020-02-20 DIAGNOSIS — Z20822 Contact with and (suspected) exposure to covid-19: Secondary | ICD-10-CM | POA: Diagnosis not present

## 2020-02-21 ENCOUNTER — Telehealth: Payer: Self-pay | Admitting: Interventional Cardiology

## 2020-02-21 ENCOUNTER — Other Ambulatory Visit: Payer: Self-pay | Admitting: Internal Medicine

## 2020-02-21 ENCOUNTER — Ambulatory Visit
Admission: RE | Admit: 2020-02-21 | Discharge: 2020-02-21 | Disposition: A | Discharge status: Home / Self Care | Payer: Medicare HMO | Source: Ambulatory Visit | Attending: Internal Medicine | Admitting: Internal Medicine

## 2020-02-21 DIAGNOSIS — M546 Pain in thoracic spine: Secondary | ICD-10-CM | POA: Diagnosis not present

## 2020-02-21 DIAGNOSIS — M47814 Spondylosis without myelopathy or radiculopathy, thoracic region: Secondary | ICD-10-CM | POA: Diagnosis not present

## 2020-02-21 DIAGNOSIS — W108XXA Fall (on) (from) other stairs and steps, initial encounter: Secondary | ICD-10-CM | POA: Diagnosis not present

## 2020-02-21 DIAGNOSIS — M549 Dorsalgia, unspecified: Secondary | ICD-10-CM

## 2020-02-21 DIAGNOSIS — M47812 Spondylosis without myelopathy or radiculopathy, cervical region: Secondary | ICD-10-CM | POA: Diagnosis not present

## 2020-02-21 DIAGNOSIS — I517 Cardiomegaly: Secondary | ICD-10-CM | POA: Diagnosis not present

## 2020-02-21 NOTE — Telephone Encounter (Signed)
    Pt said her shoulder hurts, she is not sure if its coming from her heart or sore because she fell last Saturday. She wanted to speak with Dr. Katrinka Blazing to ask for his recommendations

## 2020-02-21 NOTE — Telephone Encounter (Signed)
Followed up with pt who reports that she really thinks her shoulder pain is from where she feel this past Wednesday.  States she was calling us being "overly cautious". She reports that she is going to see her PCP today to follow up on her shoulder pain. States she has NOT taken her medications today because she was worried they may interfere w/ something.  Pt advised to take her Eliquis, Diltiazem & Metoprolol as prescribed.  Educated that they will not interfere with possible pain medication MD may prescribe for her pain. Pt reports that she has f/u w/ Dr. Lalla Brothers next week.  Advised to keep this follow up. Advised to go to ED/call 911 if chest pain begins and/or pain radiates. Patient verbalized understanding and agreeable to plan.

## 2020-02-25 ENCOUNTER — Encounter: Payer: Self-pay | Admitting: Cardiology

## 2020-02-25 ENCOUNTER — Other Ambulatory Visit: Payer: Self-pay

## 2020-02-25 ENCOUNTER — Ambulatory Visit (INDEPENDENT_AMBULATORY_CARE_PROVIDER_SITE_OTHER): Payer: Medicare HMO | Admitting: Cardiology

## 2020-02-25 VITALS — BP 116/68 | HR 111 | Ht 67.0 in | Wt 207.0 lb

## 2020-02-25 DIAGNOSIS — E119 Type 2 diabetes mellitus without complications: Secondary | ICD-10-CM

## 2020-02-25 DIAGNOSIS — I4821 Permanent atrial fibrillation: Secondary | ICD-10-CM

## 2020-02-25 MED ORDER — METOPROLOL TARTRATE 100 MG PO TABS: 100.0000 mg | ORAL_TABLET | Freq: Two times a day (BID) | ORAL | 3 refills | Status: DC

## 2020-02-25 MED ORDER — DILTIAZEM HCL ER COATED BEADS 180 MG PO CP24: 180.0000 mg | ORAL_CAPSULE | Freq: Two times a day (BID) | ORAL | 3 refills | Status: DC

## 2020-02-25 NOTE — Progress Notes (Signed)
Electrophysiology Office Follow up Visit Note:    Date:  02/25/2020   ID:  Briana Foster, DOB [birth year], MRN [id number]  PCP:  Georgann Housekeeper, MD  [facility] Cardiologist:  Lesleigh Noe, MD  [facility] Electrophysiologist:  Lanier Prude, MD    Interval History:    Briana Foster is a 75 y.o. female who presents for a follow up visit. They were last seen in clinic January 02, 2020 for permanent atrial fibrillation.  At that appointment, her rate control medications were adjusted in an effort to improve her rate control.  At that appointment, her heart rate was in the 120s at rest. Today she tells me that she has been doing well overall. She does not feel that her heart is out of rhythm. No sensation of palpitations or tachycardia. She tells me that sometimes her breathing is not great but she is unable to correlate that to any changes in her heart rates.     Past Medical History:  Diagnosis Date  . Anemia   . Arthritis 07-20-12   hands  . Asthma   . Back pain   . Cardiomegaly   . Class 1 obesity with serious comorbidity and body mass index (BMI) of 31.0 to 31.9 in adult 03/26/2018  . Depression   . Diabetes (HCC)   . Essential hypertension 03/26/2018  . GERD (gastroesophageal reflux disease)    "comes and goes" - no meds currently  . Heart murmur    told as teenager  . High blood pressure   . History of hypertension 07-20-12   pt stopped BP med several months ago because BP has been normal-MD is aware  . Hyperlipidemia   . Joint pain   . Restless leg syndrome   . Rheumatoid arthritis (HCC)   . Shortness of breath   . Tiredness   . Type 2 diabetes mellitus without complication, without long-term current use of insulin (HCC) 04/10/2018  . Vitamin D deficiency     Past Surgical History:  Procedure Laterality Date  . BUNIONECTOMY  20 yrs ago   bil feet  . BUNIONECTOMY  11/30/2011   Procedure: Arbutus Leas;  Surgeon: Drucilla Schmidt, MD;  Location:  [facility] ORS;  Service: Orthopedics;  Laterality: Bilateral;  RIGHT FOOT EXCISION OF BUNIONETTE AND PARTIAL   PROXIMAL PHALANGECTOMY OF 5TH TOE LEFT FOOT FUNK BUNIONECTOMY,EXCISION OF BUNIONETTE   . CARDIOVERSION N/A 11/05/2019   Procedure: CARDIOVERSION;  Surgeon: Lewayne Bunting, MD;  Location: [facility] ENDOSCOPY;  Service: Cardiovascular;  Laterality: N/A;  . CARDIOVERSION N/A 12/05/2019   Procedure: CARDIOVERSION;  Surgeon: Parke Poisson, MD;  Location: [facility] ENDOSCOPY;  Service: Cardiovascular;  Laterality: N/A;  . COLONOSCOPY WITH PROPOFOL N/A 08/07/2012   Procedure: COLONOSCOPY WITH PROPOFOL;  Surgeon: Charolett Bumpers, MD;  Location: [facility] ENDOSCOPY;  Service: Endoscopy;  Laterality: N/A;  . MOUTH SURGERY     teeth extractions  . TONSILLECTOMY    . TUBAL LIGATION    . WRIST SURGERY      Current Medications: Current Meds  Medication Sig  . albuterol (VENTOLIN HFA) 108 (90 Base) MCG/ACT inhaler as needed.  Marland Kitchen amiodarone (PACERONE) 200 MG tablet daily in the afternoon.  Marland Kitchen amLODipine (NORVASC) 5 MG tablet daily in the afternoon.  Marland Kitchen apixaban (ELIQUIS) 5 MG TABS tablet Take 1 tablet (5 mg total) by mouth 2 (two) times daily.  . Budeson-Glycopyrrol-Formoterol (BREZTRI AEROSPHERE) 160-9-4.8 MCG/ACT AERO as needed.  . cetirizine (ZYRTEC) 10 MG tablet as needed.  Marland Kitchen  diclofenac Sodium (VOLTAREN) 1 % GEL See admin instructions.  Marland Kitchen diltiazem (CARDIZEM CD) 180 MG 24 hr capsule Take 1 capsule (180 mg total) by mouth in the morning and at bedtime.  . metFORMIN (GLUCOPHAGE-XR) 750 MG 24 hr tablet daily in the afternoon.  . metoprolol tartrate (LOPRESSOR) 100 MG tablet Take 1 tablet (100 mg total) by mouth 2 (two) times daily.  . pantoprazole (PROTONIX) 40 MG tablet as needed.  Marland Kitchen rOPINIRole (REQUIP) 4 MG tablet daily in the afternoon.  . rosuvastatin (CRESTOR) 20 MG tablet daily in the afternoon.  . [DISCONTINUED] diltiazem (CARDIZEM CD) 180 MG 24 hr capsule Take 1 capsule (180 mg total) by mouth daily.  .  [DISCONTINUED] metoprolol tartrate (LOPRESSOR) 50 MG tablet Take 1.5 tablets (75 mg total) by mouth 2 (two) times daily.     Allergies:   Bee venom, Apricot flavor, Atorvastatin, and Wasp venom   Social History   Socioeconomic History  . Marital status: Widowed    Spouse name: Not on file  . Number of children: Not on file  . Years of education: Not on file  . Highest education level: Not on file  Occupational History  . Occupation: Retired  Tobacco Use  . Smoking status: Never Smoker  . Smokeless tobacco: Never Used  Substance and Sexual Activity  . Alcohol use: No  . Drug use: No  . Sexual activity: Not Currently  Other Topics Concern  . Not on file  Social History Narrative  . Not on file   Social Determinants of Health   Financial Resource Strain: Not on file  Food Insecurity: Not on file  Transportation Needs: Not on file  Physical Activity: Not on file  Stress: Not on file  Social Connections: Not on file     Family History: The patient's family history includes Cancer in her father; Heart disease in her mother; Stroke in her mother.  ROS:   Please see the history of present illness.    All other systems reviewed and are negative.  EKGs/Labs/Other Studies Reviewed:    The following studies were reviewed today:    Recent Labs: 10/31/2019: Platelets 254 12/05/2019: BUN 21; Creatinine, Ser 0.90; Hemoglobin 13.6; Potassium 3.8; Sodium 139  Recent Lipid Panel No results found for: CHOL, TRIG, HDL, CHOLHDL, VLDL, LDLCALC, LDLDIRECT  Physical Exam:    VS:  BP 116/68   Pulse (!) 111   Ht 5\' 7"  (1.702 m)   Wt 207 lb (93.9 kg)   SpO2 96%   BMI 32.42 kg/m     Manual recheck of pulse after sitting for 5 minutes is 90 bpm.  Wt Readings from Last 3 Encounters:  02/25/20 207 lb (93.9 kg)  01/02/20 206 lb 12.8 oz (93.8 kg)  11/27/19 203 lb (92.1 kg)     GEN: Well nourished, well developed in no acute distress HEENT: Normal NECK: No JVD; No carotid  bruits LYMPHATICS: No lymphadenopathy CARDIAC: Irregularly irregular, no murmurs, rubs, gallops RESPIRATORY:  Clear to auscultation without rales, wheezing or rhonchi  ABDOMEN: Soft, non-tender, non-distended MUSCULOSKELETAL:  No edema; No deformity  SKIN: Warm and dry NEUROLOGIC:  Alert and oriented x 3 PSYCHIATRIC:  Normal affect   ASSESSMENT:    1. Permanent atrial fibrillation (HCC)   2. Type 2 diabetes mellitus without complication, without long-term current use of insulin (HCC)    PLAN:    In order of problems listed above:  1. Permanent atrial fibrillation On Eliquis for stroke prophylaxis. On metoprolol 75 mg  twice daily and diltiazem 180 mg once daily. Overall doing well with minimal symptoms. I would like to get her resting heart rates down further. She is tolerating the BB and CCB so will increase dosages and plan to see back in 3 months with echo at that time to reassess LV function and AS severity. -increase metoprolol to 100mg  PO BID - increase cardizem to 180mg  PO BID     Medication Adjustments/Labs and Tests Ordered: Current medicines are reviewed at length with the patient today.  Concerns regarding medicines are outlined above.  No orders of the defined types were placed in this encounter.  Meds ordered this encounter  Medications  . metoprolol tartrate (LOPRESSOR) 100 MG tablet    Sig: Take 1 tablet (100 mg total) by mouth 2 (two) times daily.    Dispense:  180 tablet    Refill:  3  . diltiazem (CARDIZEM CD) 180 MG 24 hr capsule    Sig: Take 1 capsule (180 mg total) by mouth in the morning and at bedtime.    Dispense:  180 capsule    Refill:  3     Signed, , MD, [facility]  02/25/2020 9:03 AM    Electrophysiology [facility]

## 2020-02-25 NOTE — Patient Instructions (Signed)
Medication Instructions:  Your physician has recommended you make the following change in your medication:   1.  INCREASE your metoprolol--Take 100 mg by mouth twice a day  2.  INCREASE your diltiazem 180 mg- Take one capsule by mouth twice a day   Labwork: None ordered.  Testing/Procedures: Your physician has requested that you have an echocardiogram. Echocardiography is a painless test that uses sound waves to create images of your heart. It provides your doctor with information about the size and shape of your heart and how well your heart's chambers and valves are working. This procedure takes approximately one hour. There are no restrictions for this procedure.  Please schedule for ECHO in 3 months (April 2022)  Follow-Up: Schedule follow up with Dr. Lalla Brothers a few days after ECHO (April 2022)  Any Other Special Instructions Will Be Listed Below (If Applicable).  If you need a refill on your cardiac medications before your next appointment, please call your pharmacy.

## 2020-04-01 DIAGNOSIS — M25511 Pain in right shoulder: Secondary | ICD-10-CM | POA: Diagnosis not present

## 2020-04-07 DIAGNOSIS — I4891 Unspecified atrial fibrillation: Secondary | ICD-10-CM | POA: Diagnosis not present

## 2020-04-07 DIAGNOSIS — M1990 Unspecified osteoarthritis, unspecified site: Secondary | ICD-10-CM | POA: Diagnosis not present

## 2020-04-07 DIAGNOSIS — J45909 Unspecified asthma, uncomplicated: Secondary | ICD-10-CM | POA: Diagnosis not present

## 2020-04-07 DIAGNOSIS — J449 Chronic obstructive pulmonary disease, unspecified: Secondary | ICD-10-CM | POA: Diagnosis not present

## 2020-04-07 DIAGNOSIS — D649 Anemia, unspecified: Secondary | ICD-10-CM | POA: Diagnosis not present

## 2020-04-07 DIAGNOSIS — I10 Essential (primary) hypertension: Secondary | ICD-10-CM | POA: Diagnosis not present

## 2020-04-07 DIAGNOSIS — K219 Gastro-esophageal reflux disease without esophagitis: Secondary | ICD-10-CM | POA: Diagnosis not present

## 2020-04-07 DIAGNOSIS — E1169 Type 2 diabetes mellitus with other specified complication: Secondary | ICD-10-CM | POA: Diagnosis not present

## 2020-04-07 DIAGNOSIS — E782 Mixed hyperlipidemia: Secondary | ICD-10-CM | POA: Diagnosis not present

## 2020-04-10 ENCOUNTER — Other Ambulatory Visit: Payer: Self-pay | Admitting: Sports Medicine

## 2020-04-10 DIAGNOSIS — M25511 Pain in right shoulder: Secondary | ICD-10-CM

## 2020-04-20 DIAGNOSIS — M25511 Pain in right shoulder: Secondary | ICD-10-CM | POA: Diagnosis not present

## 2020-05-01 ENCOUNTER — Telehealth: Payer: Self-pay | Admitting: Interventional Cardiology

## 2020-05-01 NOTE — Telephone Encounter (Signed)
Spoke with pt and she wanted to go over her medications because she's afraid her pharmacy is not getting them right.  Reviewed cardiac medications.  She was appreciative for call.

## 2020-05-01 NOTE — Telephone Encounter (Signed)
Patient is requesting to go over her medication list. She states she needs clarification on her her medications and dosages.

## 2020-05-05 DIAGNOSIS — M25511 Pain in right shoulder: Secondary | ICD-10-CM | POA: Diagnosis not present

## 2020-05-06 DIAGNOSIS — D649 Anemia, unspecified: Secondary | ICD-10-CM | POA: Diagnosis not present

## 2020-05-06 DIAGNOSIS — K219 Gastro-esophageal reflux disease without esophagitis: Secondary | ICD-10-CM | POA: Diagnosis not present

## 2020-05-06 DIAGNOSIS — E1169 Type 2 diabetes mellitus with other specified complication: Secondary | ICD-10-CM | POA: Diagnosis not present

## 2020-05-06 DIAGNOSIS — I10 Essential (primary) hypertension: Secondary | ICD-10-CM | POA: Diagnosis not present

## 2020-05-06 DIAGNOSIS — E782 Mixed hyperlipidemia: Secondary | ICD-10-CM | POA: Diagnosis not present

## 2020-05-06 DIAGNOSIS — J45909 Unspecified asthma, uncomplicated: Secondary | ICD-10-CM | POA: Diagnosis not present

## 2020-05-06 DIAGNOSIS — J449 Chronic obstructive pulmonary disease, unspecified: Secondary | ICD-10-CM | POA: Diagnosis not present

## 2020-05-06 DIAGNOSIS — I4891 Unspecified atrial fibrillation: Secondary | ICD-10-CM | POA: Diagnosis not present

## 2020-05-11 ENCOUNTER — Ambulatory Visit (HOSPITAL_COMMUNITY): Payer: Medicare Other | Attending: Cardiology

## 2020-05-11 ENCOUNTER — Other Ambulatory Visit: Payer: Self-pay

## 2020-05-11 DIAGNOSIS — I4821 Permanent atrial fibrillation: Secondary | ICD-10-CM | POA: Insufficient documentation

## 2020-05-11 LAB — ECHOCARDIOGRAM COMPLETE
AR max vel: 0.79 cm2
AV Area VTI: 0.73 cm2
AV Area mean vel: 0.75 cm2
AV Mean grad: 29 mmHg
AV Peak grad: 31.3 mmHg
Ao pk vel: 2.8 m/s
Area-P 1/2: 2.73 cm2
S' Lateral: 3.3 cm

## 2020-05-14 ENCOUNTER — Ambulatory Visit: Payer: Medicare Other | Admitting: Cardiology

## 2020-05-15 ENCOUNTER — Telehealth: Payer: Self-pay | Admitting: Interventional Cardiology

## 2020-05-15 NOTE — Telephone Encounter (Signed)
Called pt and reviewed echo results.

## 2020-05-15 NOTE — Telephone Encounter (Signed)
Pt is returning a call to [place] from earlier today. Please advise

## 2020-05-25 DIAGNOSIS — Z03818 Encounter for observation for suspected exposure to other biological agents ruled out: Secondary | ICD-10-CM | POA: Diagnosis not present

## 2020-05-25 DIAGNOSIS — Z20822 Contact with and (suspected) exposure to covid-19: Secondary | ICD-10-CM | POA: Diagnosis not present

## 2020-06-01 DIAGNOSIS — M75111 Incomplete rotator cuff tear or rupture of right shoulder, not specified as traumatic: Secondary | ICD-10-CM | POA: Diagnosis not present

## 2020-06-01 DIAGNOSIS — M7551 Bursitis of right shoulder: Secondary | ICD-10-CM | POA: Diagnosis not present

## 2020-06-01 DIAGNOSIS — M19011 Primary osteoarthritis, right shoulder: Secondary | ICD-10-CM | POA: Diagnosis not present

## 2020-06-01 DIAGNOSIS — S43431A Superior glenoid labrum lesion of right shoulder, initial encounter: Secondary | ICD-10-CM | POA: Diagnosis not present

## 2020-06-02 ENCOUNTER — Ambulatory Visit: Payer: Medicare Other | Admitting: Cardiology

## 2020-06-02 DIAGNOSIS — J45909 Unspecified asthma, uncomplicated: Secondary | ICD-10-CM | POA: Diagnosis not present

## 2020-06-02 DIAGNOSIS — J449 Chronic obstructive pulmonary disease, unspecified: Secondary | ICD-10-CM | POA: Diagnosis not present

## 2020-06-02 DIAGNOSIS — E782 Mixed hyperlipidemia: Secondary | ICD-10-CM | POA: Diagnosis not present

## 2020-06-02 DIAGNOSIS — D649 Anemia, unspecified: Secondary | ICD-10-CM | POA: Diagnosis not present

## 2020-06-02 DIAGNOSIS — I4891 Unspecified atrial fibrillation: Secondary | ICD-10-CM | POA: Diagnosis not present

## 2020-06-02 DIAGNOSIS — K219 Gastro-esophageal reflux disease without esophagitis: Secondary | ICD-10-CM | POA: Diagnosis not present

## 2020-06-02 DIAGNOSIS — I10 Essential (primary) hypertension: Secondary | ICD-10-CM | POA: Diagnosis not present

## 2020-06-02 DIAGNOSIS — E1169 Type 2 diabetes mellitus with other specified complication: Secondary | ICD-10-CM | POA: Diagnosis not present

## 2020-06-02 DIAGNOSIS — M1990 Unspecified osteoarthritis, unspecified site: Secondary | ICD-10-CM | POA: Diagnosis not present

## 2020-06-07 NOTE — Progress Notes (Signed)
Cardiology Office Note:    Date:  06/09/2020   ID:  Briana Foster, DOB [birth year], MRN [id number]  PCP:  Georgann Housekeeper, MD  Cardiologist:  Lesleigh Noe, MD   Referring MD: Georgann Housekeeper, MD   Chief Complaint  Patient presents with  . Cardiac Valve Problem  . Atrial Fibrillation    History of Present Illness:    Briana Foster is a 75 y.o. female with a hx of hypertension, DM 2, cardiomegaly, chest pain evaluation(no recurrence) , andaortic stenosis (severe10/202021), and permanent AF with rate control after failed rhythm control.  She developed atrial fibrillation.  She has attempted rhythm control but failed under the direction of Dr. Lalla Brothers.  Currently complains of exertional fatigue and dyspnea.  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  As orthopnea, PND, has not had syncope, and denies angina.  Dyspnea on exertion and fatigue are much worse now than 6 months ago.  No orthopnea.  Past Medical History:  Diagnosis Date  . Anemia   . Arthritis 07-20-12   hands  . Asthma   . Back pain   . Cardiomegaly   . Class 1 obesity with serious comorbidity and body mass index (BMI) of 31.0 to 31.9 in adult 03/26/2018  . Depression   . Diabetes (HCC)   . Essential hypertension 03/26/2018  . GERD (gastroesophageal reflux disease)    "comes and goes" - no meds currently  . Heart murmur    told as teenager  . High blood pressure   . History of hypertension 07-20-12   pt stopped BP med several months ago because BP has been normal-MD is aware  . Hyperlipidemia   . Joint pain   . Restless leg syndrome   . Rheumatoid arthritis (HCC)   . Shortness of breath   . Tiredness   . Type 2 diabetes mellitus without complication, without long-term current use of insulin (HCC) 04/10/2018  . Vitamin D deficiency     Past Surgical History:  Procedure Laterality Date  . BUNIONECTOMY  20 yrs ago   bil feet  .  BUNIONECTOMY  11/30/2011   Procedure: Arbutus Leas;  Surgeon: Drucilla Schmidt, MD;  Location: [facility];  Service: Orthopedics;  Laterality: Bilateral;  RIGHT FOOT EXCISION OF BUNIONETTE AND PARTIAL   PROXIMAL PHALANGECTOMY OF 5TH TOE LEFT FOOT FUNK BUNIONECTOMY,EXCISION OF BUNIONETTE   . CARDIOVERSION N/A 11/05/2019   Procedure: CARDIOVERSION;  Surgeon: Lewayne Bunting, MD;  Location: [facility] ENDOSCOPY;  Service: Cardiovascular;  Laterality: N/A;  . CARDIOVERSION N/A 12/05/2019   Procedure: CARDIOVERSION;  Surgeon: Parke Poisson, MD;  Location: [facility] ENDOSCOPY;  Service: Cardiovascular;  Laterality: N/A;  . COLONOSCOPY WITH PROPOFOL N/A 08/07/2012   Procedure: COLONOSCOPY WITH PROPOFOL;  Surgeon: Charolett Bumpers, MD;  Location: [facility] ENDOSCOPY;  Service: Endoscopy;  Laterality: N/A;  . MOUTH SURGERY     teeth extractions  . TONSILLECTOMY    . TUBAL LIGATION    . WRIST SURGERY      Current Medications: Current Meds  Medication Sig  . albuterol (VENTOLIN HFA) 108 (90 Base) MCG/ACT inhaler as needed.  Marland Kitchen amiodarone (PACERONE) 200 MG tablet daily in the afternoon.  Marland Kitchen amLODipine (NORVASC) 5 MG tablet daily in the afternoon.  Marland Kitchen apixaban (ELIQUIS) 5 MG TABS tablet Take 1 tablet (5 mg total) by mouth 2 (two) times daily.  . Budeson-Glycopyrrol-Formoterol (BREZTRI AEROSPHERE) 160-9-4.8 MCG/ACT AERO as needed.  Marland Kitchen  cetirizine (ZYRTEC) 10 MG tablet as needed.  . diclofenac Sodium (VOLTAREN) 1 % GEL See admin instructions.  . metFORMIN (GLUCOPHAGE-XR) 750 MG 24 hr tablet daily in the afternoon.  . pantoprazole (PROTONIX) 40 MG tablet as needed.  Marland Kitchen rOPINIRole (REQUIP) 4 MG tablet daily in the afternoon.  . rosuvastatin (CRESTOR) 20 MG tablet daily in the afternoon.     Allergies:   Bee venom, Apricot flavor, Atorvastatin, and Wasp venom   Social History   Socioeconomic History  . Marital status: Widowed    Spouse name: Not on file  . Number of children: Not on file  . Years of education: Not on file   . Highest education level: Not on file  Occupational History  . Occupation: Retired  Tobacco Use  . Smoking status: Never Smoker  . Smokeless tobacco: Never Used  Substance and Sexual Activity  . Alcohol use: No  . Drug use: No  . Sexual activity: Not Currently  Other Topics Concern  . Not on file  Social History Narrative  . Not on file   Social Determinants of Health   Financial Resource Strain: Not on file  Food Insecurity: Not on file  Transportation Needs: Not on file  Physical Activity: Not on file  Stress: Not on file  Social Connections: Not on file     Family History: The patient's family history includes Cancer in her father; Heart disease in her mother; Stroke in her mother.  ROS:   Please see the history of present illness.    Anxious about management.  Wants to go to valve fix.  At the same time she is frightened.  All other systems reviewed and are negative.  EKGs/Labs/Other Studies Reviewed:    The following studies were reviewed today:  ECHOCARDIOGRAM 05/2020: IMPRESSIONS    1. Left ventricular ejection fraction, by estimation, is 55 to 60%. The  left ventricle has normal function. The left ventricle has no regional  wall motion abnormalities. There is mild left ventricular hypertrophy.  Left ventricular diastolic parameters  are indeterminate.  2. Right ventricular systolic function is normal. The right ventricular  size is normal. There is normal pulmonary artery systolic pressure. The  estimated right ventricular systolic pressure is 29.6 mmHg.  3. Left atrial size was moderately dilated.  4. Right atrial size was moderately dilated.  5. The mitral valve is degenerative. Trivial mitral valve regurgitation.  Moderate mitral annular calcification.  6. The aortic valve is tricuspid. Aortic valve regurgitation is trivial.  Possible paradoxical low flow/low gradient severe aortic valve stenosis,  however patient is in atrial fibrillation with  RVR so would repeat echo  when rate is better-controlled.  Maximum aortic valve velocity is 280 cm/s and stroke-volume index was 19.  EKG:  EKG not performed  Recent Labs: 10/31/2019: Platelets 254 12/05/2019: BUN 21; Creatinine, Ser 0.90; Hemoglobin 13.6; Potassium 3.8; Sodium 139  Recent Lipid Panel No results found for: CHOL, TRIG, HDL, CHOLHDL, VLDL, LDLCALC, LDLDIRECT  Physical Exam:    VS:  BP 110/82   Pulse (!) 59   Ht 5\' 8"  (1.727 m)   Wt 211 lb (95.7 kg)   SpO2 97%   BMI 32.08 kg/m     Wt Readings from Last 3 Encounters:  06/09/20 211 lb (95.7 kg)  02/25/20 207 lb (93.9 kg)  01/02/20 206 lb 12.8 oz (93.8 kg)     GEN: Overweight. No acute distress HEENT: Normal NECK: No JVD. LYMPHATICS: No lymphadenopathy CARDIAC: 3/6 crescendo decrescendo systolic  murmur. RRR no gallop, or edema. VASCULAR:  Normal Pulses. No bruits. RESPIRATORY:  Clear to auscultation without rales, wheezing or rhonchi  ABDOMEN: Soft, non-tender, non-distended, No pulsatile mass, MUSCULOSKELETAL: No deformity  SKIN: Warm and dry NEUROLOGIC:  Alert and oriented x 3 PSYCHIATRIC:  Normal affect   ASSESSMENT:    1. Aortic valve stenosis, etiology of cardiac valve disease unspecified   2. Permanent atrial fibrillation (HCC)   3. Essential hypertension   4. Type 2 diabetes mellitus without complication, without long-term current use of insulin (HCC)   5. Persistent atrial fibrillation (HCC)    PLAN:    In order of problems listed above:  1. Probable low-flow low gradient calcific aortic stenosis.  Will undergo left and right heart cath followed by referral to the valve clinic for consideration of replacement by surgery or TAVR. 2. Managed by Dr. Lalla Brothers.  Up titration of rate controlling medications.  Amiodarone is discontinued today. 3. Blood pressure control is excellent with current therapy for rate control. 4. We did not discuss diabetes.  The patient was counseled to undergo left heart  catheterization, coronary angiography, and possible percutaneous coronary intervention with stent implantation. The procedural risks and benefits were discussed in detail. The risks discussed included death, stroke, myocardial infarction, life-threatening bleeding, limb ischemia, kidney injury, allergy, and possible emergency cardiac surgery. The risk of these significant complications were estimated to occur less than 1% of the time. After discussion, the patient has agreed to proceed.    Medication Adjustments/Labs and Tests Ordered: Current medicines are reviewed at length with the patient today.  Concerns regarding medicines are outlined above.  No orders of the defined types were placed in this encounter.  No orders of the defined types were placed in this encounter.   There are no Patient Instructions on file for this visit.   Signed, Lesleigh Noe, MD  06/09/2020 2:03 PM    [facility]

## 2020-06-09 ENCOUNTER — Other Ambulatory Visit: Payer: Self-pay

## 2020-06-09 ENCOUNTER — Encounter: Payer: Self-pay | Admitting: Interventional Cardiology

## 2020-06-09 ENCOUNTER — Telehealth: Payer: Self-pay | Admitting: Interventional Cardiology

## 2020-06-09 ENCOUNTER — Ambulatory Visit: Payer: Medicare Other | Admitting: Interventional Cardiology

## 2020-06-09 VITALS — BP 110/82 | HR 59 | Ht 68.0 in | Wt 211.0 lb

## 2020-06-09 DIAGNOSIS — I10 Essential (primary) hypertension: Secondary | ICD-10-CM | POA: Diagnosis not present

## 2020-06-09 DIAGNOSIS — E119 Type 2 diabetes mellitus without complications: Secondary | ICD-10-CM

## 2020-06-09 DIAGNOSIS — I350 Nonrheumatic aortic (valve) stenosis: Secondary | ICD-10-CM | POA: Diagnosis not present

## 2020-06-09 DIAGNOSIS — I4821 Permanent atrial fibrillation: Secondary | ICD-10-CM | POA: Diagnosis not present

## 2020-06-09 DIAGNOSIS — I4819 Other persistent atrial fibrillation: Secondary | ICD-10-CM

## 2020-06-09 NOTE — Telephone Encounter (Signed)
Spoke with pt and made her aware to stop the Amiodarone.  Pt verbalized understanding and was appreciative for call.

## 2020-06-09 NOTE — Telephone Encounter (Signed)
Pt called back in returning [place] calls.    Best number [phone]

## 2020-06-09 NOTE — H&P (View-Only) (Signed)
 [facility] Consult Note  Chief Complaint  Patient presents with  . New Patient (Initial Visit)    Severe aortic stenosis   History of Present Illness: 74 yo female with history of anemia, hyperlipidemia, rheumatoid arthritis, GERD, HTN, diabetes, persistent atrial fibrillation and severe aortic stenosis who is here today as a new consult, referred by Dr. Smith, for further discussion regarding his aortic stenosis and possible TAVR. She is followed in our office by Dr. Smith. Echo April 2022 with LVEF=55-60%, mild LVH. Moderate mitral annular calcification with trivial mitral regurgitation. The aortic valve leaflets are thickened and calcified with limited leaflet excursion. Mean gradient 29 mmHg, peak gradient 31 mmHg, AVA 0.73 cm2. Dimensionless index 0.23. SVI 19. This is consistent with paradoxical low flow/low gradient severe aortic stenosis. She was in rapid atrial fibrillation during the echo. She has persistent atrial fibrillation and is on Eliquis. She has borderline hyperglycemia and is on metformin. She was seen by Dr. Smith 06/09/20 and c/o exertional dyspnea and fatigue that has progressively worsened over the past six months. Cardiac cath is planned for May 18,2022.   She tells me today that she has had progressive dyspnea and fatigue with some lower extremity edema. She denies chest pain or dizziness. She has full dentures. [place] outside of [place]. She lives alone but her daughter is living with her now. She is retired from [employer].   Primary Care Physician: Husain, Karrar, MD Primary Cardiologist: Smith Referring Cardiologist: Smith  Past Medical History:  Diagnosis Date  . Anemia   . Arthritis 07-20-12   hands  . Asthma   . Back pain   . Cardiomegaly   . Class 1 obesity with serious comorbidity and body mass index (BMI) of 31.0 to 31.9 in adult 03/26/2018  . Depression   . Diabetes (HCC)   . Essential hypertension 03/26/2018  . GERD  (gastroesophageal reflux disease)    "comes and goes" - no meds currently  . Heart murmur    told as teenager  . High blood pressure   . History of hypertension 07-20-12   pt stopped BP med several months ago because BP has been normal-MD is aware  . Hyperlipidemia   . Joint pain   . Restless leg syndrome   . Rheumatoid arthritis (HCC)   . Shortness of breath   . Tiredness   . Type 2 diabetes mellitus without complication, without long-term current use of insulin (HCC) 04/10/2018  . Vitamin D deficiency     Past Surgical History:  Procedure Laterality Date  . BUNIONECTOMY  20 yrs ago   bil feet  . BUNIONECTOMY  11/30/2011   Procedure: BUNIONECTOMY;  Surgeon: James P Aplington, MD;  Location: [facility];  Service: Orthopedics;  Laterality: Bilateral;  RIGHT FOOT EXCISION OF BUNIONETTE AND PARTIAL   PROXIMAL PHALANGECTOMY OF 5TH TOE LEFT FOOT FUNK BUNIONECTOMY,EXCISION OF BUNIONETTE   . CARDIOVERSION N/A 11/05/2019   Procedure: CARDIOVERSION;  Surgeon: Crenshaw, Brian S, MD;  Location: [facility] ENDOSCOPY;  Service: Cardiovascular;  Laterality: N/A;  . CARDIOVERSION N/A 12/05/2019   Procedure: CARDIOVERSION;  Surgeon: Acharya, Gayatri A, MD;  Location: [facility] ENDOSCOPY;  Service: Cardiovascular;  Laterality: N/A;  . COLONOSCOPY WITH PROPOFOL N/A 08/07/2012   Procedure: COLONOSCOPY WITH PROPOFOL;  Surgeon: Martin K Johnson, MD;  Location: [facility];  Service: Endoscopy;  Laterality: N/A;  . MOUTH SURGERY     teeth extractions  . TONSILLECTOMY    . TUBAL LIGATION    .   WRIST SURGERY      Current Outpatient Medications  Medication Sig Dispense Refill  . albuterol (VENTOLIN HFA) 108 (90 Base) MCG/ACT inhaler as needed.    Marland Kitchen amLODipine (NORVASC) 5 MG tablet daily in the afternoon.    Marland Kitchen apixaban (ELIQUIS) 5 MG TABS tablet Take 1 tablet (5 mg total) by mouth 2 (two) times daily. 180 tablet 1  . Budeson-Glycopyrrol-Formoterol (BREZTRI AEROSPHERE) 160-9-4.8 MCG/ACT AERO as needed.    . cetirizine  (ZYRTEC) 10 MG tablet as needed.    . diclofenac Sodium (VOLTAREN) 1 % GEL See admin instructions.    . metFORMIN (GLUCOPHAGE-XR) 750 MG 24 hr tablet daily in the afternoon.    . pantoprazole (PROTONIX) 40 MG tablet as needed.    Marland Kitchen rOPINIRole (REQUIP) 4 MG tablet daily in the afternoon.    . rosuvastatin (CRESTOR) 20 MG tablet daily in the afternoon.    . diltiazem (CARDIZEM CD) 180 MG 24 hr capsule Take 1 capsule (180 mg total) by mouth in the morning and at bedtime. 180 capsule 3  . metoprolol tartrate (LOPRESSOR) 100 MG tablet Take 1 tablet (100 mg total) by mouth 2 (two) times daily. 180 tablet 3   No current facility-administered medications for this visit.    Allergies  Allergen Reactions  . Bee Venom Shortness Of Breath and Rash  . Apricot Flavor Swelling    Eyes swell shut  . Atorvastatin Other (See Comments)  . Wasp Venom Other (See Comments)    Social History   Socioeconomic History  . Marital status: Widowed    Spouse name: Not on file  . Number of children: 2  . Years of education: Not on file  . Highest education level: Not on file  Occupational History  . Occupation: Retired-worked at [employer]  . Smoking status: Never Smoker  . Smokeless tobacco: Never Used  Substance and Sexual Activity  . Alcohol use: No  . Drug use: No  . Sexual activity: Not Currently  Other Topics Concern  . Not on file  Social History Narrative  . Not on file   Social Determinants of Health   Financial Resource Strain: Not on file  Food Insecurity: Not on file  Transportation Needs: Not on file  Physical Activity: Not on file  Stress: Not on file  Social Connections: Not on file  Intimate Partner Violence: Not on file    Family History  Problem Relation Age of Onset  . Heart disease Mother   . Stroke Mother   . Cancer Father        Prostate    Review of Systems:  As stated in the HPI and otherwise negative.   BP 126/78   Pulse (!) 121   Ht   (1.727 m)   Wt 212 lb (96.2 kg)   SpO2 97%   BMI 32.23 kg/m   Physical Examination: General: Well developed, well nourished, NAD  HEENT: OP clear, mucus membranes moist  SKIN: warm, dry. No rashes. Neuro: No focal deficits  Musculoskeletal: Muscle strength 5/5 all ext  Psychiatric: Mood and affect normal  Neck: No JVD, no carotid bruits, no thyromegaly, no lymphadenopathy.  Lungs:Clear bilaterally, no wheezes, rhonci, crackles Cardiovascular: Irreg irreg. Loud, harsh, late peaking systolic murmur.  Abdomen:Soft. Bowel sounds present. Non-tender.  Extremities: Trace No lower extremity edema. Pulses are 2 + in the bilateral DP/PT.  EKG:  EKG is ordered today. The ekg ordered today demonstrates Atrial fibrillation, rate 121 bpm  Echo 05/11/20:  1. Left ventricular ejection fraction, by estimation, is 55 to 60%. The  left ventricle has normal function. The left ventricle has no regional  wall motion abnormalities. There is mild left ventricular hypertrophy.  Left ventricular diastolic parameters  are indeterminate.  2. Right ventricular systolic function is normal. The right ventricular  size is normal. There is normal pulmonary artery systolic pressure. The  estimated right ventricular systolic pressure is 29.6 mmHg.  3. Left atrial size was moderately dilated.  4. Right atrial size was moderately dilated.  5. The mitral valve is degenerative. Trivial mitral valve regurgitation.  Moderate mitral annular calcification.  6. The aortic valve is tricuspid. Aortic valve regurgitation is trivial.  Possible paradoxical low flow/low gradient severe aortic valve stenosis,  however patient is in atrial fibrillation with RVR so would repeat echo  when rate is better-controlled.  Aortic valve area, by VTI measures 0.73 cm. Aortic valve mean gradient  measures 29.0 mmHg.  7. The inferior vena cava is normal in size with greater than 50%  respiratory variability, suggesting right  atrial pressure of 3 mmHg.  8. Atrial fibrillation with RVR.   FINDINGS  Left Ventricle: Left ventricular ejection fraction, by estimation, is 55  to 60%. The left ventricle has normal function. The left ventricle has no  regional wall motion abnormalities. The left ventricular internal cavity  size was normal in size. There is  mild left ventricular hypertrophy. Left ventricular diastolic parameters  are indeterminate.   Right Ventricle: The right ventricular size is normal. No increase in  right ventricular wall thickness. Right ventricular systolic function is  normal. There is normal pulmonary artery systolic pressure. The tricuspid  regurgitant velocity is 2.58 m/s, and  with an assumed right atrial pressure of 3 mmHg, the estimated right  ventricular systolic pressure is 29.6 mmHg.   Left Atrium: Left atrial size was moderately dilated.   Right Atrium: Right atrial size was moderately dilated.   Pericardium: There is no evidence of pericardial effusion.   Mitral Valve: The mitral valve is degenerative in appearance. There is  moderate calcification of the mitral valve leaflet(s). Moderate mitral  annular calcification. Trivial mitral valve regurgitation.   Tricuspid Valve: The tricuspid valve is normal in structure. Tricuspid  valve regurgitation is mild.   Aortic Valve: The aortic valve is tricuspid. Aortic valve regurgitation is  trivial. Severe aortic stenosis is present. Aortic valve mean gradient  measures 29.0 mmHg. Aortic valve peak gradient measures 31.3 mmHg. Aortic  valve area, by VTI measures 0.73  cm.   Pulmonic Valve: The pulmonic valve was normal in structure. Pulmonic valve  regurgitation is not visualized.   Aorta: The aortic root is normal in size and structure.   Venous: The inferior vena cava is normal in size with greater than 50%  respiratory variability, suggesting right atrial pressure of 3 mmHg.   IAS/Shunts: No atrial level shunt  detected by color flow Doppler.     LEFT VENTRICLE  PLAX 2D  LVIDd:     4.30 cm  LVIDs:     3.30 cm  LV PW:     1.20 cm  LV IVS:    1.40 cm  LVOT diam:   2.00 cm  LV SV:     39  LV SV Index:  19  LVOT Area:   3.14 cm     RIGHT VENTRICLE      IVC  RVSP:      29.6 mmHg IVC diam: 1.70 cm  LEFT ATRIUM       Index    RIGHT ATRIUM      Index  LA diam:    3.90 cm 1.90 cm/m RA Pressure: 3.00 mmHg  LA Vol (A2C):  106.0 ml 51.66 ml/m RA Area:   18.70 cm  LA Vol (A4C):  79.7 ml 38.84 ml/m RA Volume:  55.40 ml 27.00 ml/m  LA Biplane Vol: 92.3 ml 44.98 ml/m  AORTIC VALVE  AV Area (Vmax):  0.79 cm  AV Area (Vmean):  0.75 cm  AV Area (VTI):   0.73 cm  AV Vmax:      279.60 cm/s  AV Vmean:     206.600 cm/s  AV VTI:      0.536 m  AV Peak Grad:   31.3 mmHg  AV Mean Grad:   29.0 mmHg  LVOT Vmax:     69.88 cm/s  LVOT Vmean:    49.020 cm/s  LVOT VTI:     0.124 m  LVOT/AV VTI ratio: 0.23    AORTA  Ao Root diam: 3.10 cm  Ao Asc diam: 3.20 cm   MV E velocity: 157.40 cm/s TRICUSPID VALVE               TR Peak grad:  26.6 mmHg               TR Vmax:    258.00 cm/s               Estimated RAP: 3.00 mmHg               RVSP:      29.6 mmHg                 SHUNTS               Systemic VTI: 0.12 m               Systemic Diam: 2.00 cm  Recent Labs: 06/09/2020: BUN 11; Creatinine, Ser 0.74; Hemoglobin 12.6; Platelets 229; Potassium 4.1; Sodium 141     Wt Readings from Last 3 Encounters:  06/10/20 212 lb (96.2 kg)  06/09/20 211 lb (95.7 kg)  02/25/20 207 lb (93.9 kg)     Other studies Reviewed: Additional studies/ records that were reviewed today include: echo images, office notes Review of the above records demonstrates: severe AS   STS Risk  Score: Risk of Mortality: 2.308% Renal Failure: 1.788% Permanent Stroke: 2.008% Prolonged Ventilation: 8.167% DSW Infection: 0.150% Reoperation: 2.807% Morbidity or Mortality: 11.080% Short Length of Stay: 29.342% Long Length of Stay: 6.693%    Assessment and Plan:   1. Severe Aortic Valve Stenosis: She likely has severe, stage D3 aortic valve stenosis (paradoxical low flow/low gradient AS). I have personally reviewed the echo images. The aortic valve is thickened, calcified with limited leaflet mobility but the valve does open. She was in rapid atrial fib during the echo. Her atrial fibrillation has been difficult to rate control. Her dyspnea is likely a combination of her atrial fibrillation and aortic stenosis. I think she would benefit from AVR. She would be a candidate for conventional AVR by surgical approach or TAVR.    -Will discuss potentially adding amiodarone for better rate control with Dr. Katrinka Blazing. She is on high doses of Cardizem and Metoprolol.   I have reviewed the natural history of aortic stenosis with the patient and their family members  who are present today. We have discussed the limitations of medical therapy and the  poor prognosis associated with symptomatic aortic stenosis. We have reviewed potential treatment options, including palliative medical therapy, conventional surgical aortic valve replacement, and transcatheter aortic valve replacement. We discussed treatment options in the context of the patient's specific comorbid medical conditions.   She would like to proceed with planning for TAVR. Right and left heart cath at [facility] 06/17/20 per Dr. Katrinka Blazing. Risks and benefits of the cath procedure and the valve procedure are reviewed with the patient. After the cath, she will have a cardiac CT, CTA of the chest/abdomen and pelvis, carotid artery dopplers, PT assessment and will then be referred to see one of the CT surgeons on our TAVR team.     Current medicines are  reviewed at length with the patient today.  The patient does not have concerns regarding medicines.  The following changes have been made:  no change  Labs/ tests ordered today include:   Orders Placed This Encounter  Procedures  . EKG 12-Lead     Disposition:   F/U with the valve team   Signed, [name], MD 06/10/2020 1:05 PM    [facility] [address] [place], [place], [place]  [address] Phone: [phone]; Fax: [phone]

## 2020-06-09 NOTE — Patient Instructions (Addendum)
Medication Instructions:  1) DISCONTINUE Amiodarone  *If you need a refill on your cardiac medications before your next appointment, please call your pharmacy*   Lab Work: CBC and BMET today If you have labs (blood work) drawn today and your tests are completely normal, you will receive your results only by: Marland Kitchen MyChart Message (if you have MyChart) OR . A paper copy in the mail If you have any lab test that is abnormal or we need to change your treatment, we will call you to review the results.   Testing/Procedures: Your physician has requested that you have a cardiac catheterization. Cardiac catheterization is used to diagnose and/or treat various heart conditions. Doctors may recommend this procedure for a number of different reasons. The most common reason is to evaluate chest pain. Chest pain can be a symptom of coronary artery disease (CAD), and cardiac catheterization can show whether plaque is narrowing or blocking your heart's arteries. This procedure is also used to evaluate the valves, as well as measure the blood flow and oxygen levels in different parts of your heart. For further information please visit [email or web address] Please follow instruction sheet, as given.   Follow-Up: You have been referred to the TAVR team.   Other Instructions  Due to recent COVID-19 restrictions implemented by our local and state authorities and in an effort to keep both patients and staff as safe as possible, our hospital system requires COVID-19 testing prior to certain scheduled hospital procedures.  Please go to [address] [facility]. [place], [place] [address] on Monday, May 16th at 8:55am  .  This is a drive up testing site.  You will not need to exit your vehicle. You must agree to self-quarantine from the time of your testing until the procedure date on Wednesday, May 18th.  This should included staying home with ONLY the people you live with.  Avoid take-out, grocery store shopping or leaving the  house for any non-emergent reason.  Failure to have your COVID-19 test done on the date and time you have been scheduled will result in cancellation of your procedure.  Please call our office at [phone] if you have any questions.    [facility] CARDIOVASCULAR DIVISION [facility] OFFICE [address] [place] [address] Dept: [phone] Loc: [phone]  Briana Foster  06/09/2020  You are scheduled for a Cardiac Catheterization on Wednesday, May 18 with Dr. Verdis Prime.  1. Please arrive at the [facility] (Main Entrance A) at [facility]: [address] [place], [place] [address] at 6:30 AM (This time is two hours before your procedure to ensure your preparation). Free valet parking service is available.   Special note: Every effort is made to have your procedure done on time. Please understand that emergencies sometimes delay scheduled procedures.  2. Diet: Do not eat solid foods after midnight.  The patient may have clear liquids until 5am upon the day of the procedure.  3. Labs: You will have labs drawn today.  4. Medication instructions in preparation for your procedure:   Contrast Allergy: No    Stop taking Eliquis (Apixiban) on Monday, May 16.   Do not take Diabetes Med Glucophage (Metformin) on the day of the procedure and HOLD 48 HOURS AFTER THE PROCEDURE.  On the morning of your procedure, take your Aspirin and any morning medicines NOT listed above.  You may use sips of water.  5. Plan for one night stay--bring personal  belongings. 6. Bring a current list of your medications and current insurance cards. 7. You MUST have a responsible person to drive you home. 8. Someone MUST be with you the first 24 hours after you arrive home or your discharge will be delayed. 9. Please wear clothes that are easy to get on and off and wear slip-on shoes.  Thank you for allowing [place] to care for you!   -- [facility]

## 2020-06-09 NOTE — Progress Notes (Signed)
Structural Heart Clinic Consult Note  Chief Complaint  Patient presents with  . New Patient (Initial Visit)    Severe aortic stenosis   History of Present Illness: 75 yo female with history of anemia, hyperlipidemia, rheumatoid arthritis, GERD, HTN, diabetes, persistent atrial fibrillation and severe aortic stenosis who is here today as a new consult, referred by Dr. Katrinka Blazing, for further discussion regarding his aortic stenosis and possible TAVR. She is followed in our office by Dr. Katrinka Blazing. Echo April 2022 with LVEF=55-60%, mild LVH. Moderate mitral annular calcification with trivial mitral regurgitation. The aortic valve leaflets are thickened and calcified with limited leaflet excursion. Mean gradient 29 mmHg, peak gradient 31 mmHg, AVA 0.73 cm2. Dimensionless index 0.23. SVI 19. This is consistent with paradoxical low flow/low gradient severe aortic stenosis. She was in rapid atrial fibrillation during the echo. She has persistent atrial fibrillation and is on Eliquis. She has borderline hyperglycemia and is on metformin. She was seen by Dr. Katrinka Blazing 06/09/20 and c/o exertional dyspnea and fatigue that has progressively worsened over the past six months. Cardiac cath is planned for May 18,2022.   She tells me today that she has had progressive dyspnea and fatigue with some lower extremity edema. She denies chest pain or dizziness. She has full dentures. [place] outside of [place]. She lives alone but her daughter is living with her now. She is retired from [employer].   Primary Care Physician: Georgann Housekeeper, MD Primary Cardiologist: Katrinka Blazing Referring Cardiologist: Katrinka Blazing  Past Medical History:  Diagnosis Date  . Anemia   . Arthritis 07-20-12   hands  . Asthma   . Back pain   . Cardiomegaly   . Class 1 obesity with serious comorbidity and body mass index (BMI) of 31.0 to 31.9 in adult 03/26/2018  . Depression   . Diabetes (HCC)   . Essential hypertension 03/26/2018  . GERD  (gastroesophageal reflux disease)    "comes and goes" - no meds currently  . Heart murmur    told as teenager  . High blood pressure   . History of hypertension 07-20-12   pt stopped BP med several months ago because BP has been normal-MD is aware  . Hyperlipidemia   . Joint pain   . Restless leg syndrome   . Rheumatoid arthritis (HCC)   . Shortness of breath   . Tiredness   . Type 2 diabetes mellitus without complication, without long-term current use of insulin (HCC) 04/10/2018  . Vitamin D deficiency     Past Surgical History:  Procedure Laterality Date  . BUNIONECTOMY  20 yrs ago   bil feet  . BUNIONECTOMY  11/30/2011   Procedure: Arbutus Leas;  Surgeon: Drucilla Schmidt, MD;  Location: [facility] ORS;  Service: Orthopedics;  Laterality: Bilateral;  RIGHT FOOT EXCISION OF BUNIONETTE AND PARTIAL   PROXIMAL PHALANGECTOMY OF 5TH TOE LEFT FOOT FUNK BUNIONECTOMY,EXCISION OF BUNIONETTE   . CARDIOVERSION N/A 11/05/2019   Procedure: CARDIOVERSION;  Surgeon: Lewayne Bunting, MD;  Location: [facility] ENDOSCOPY;  Service: Cardiovascular;  Laterality: N/A;  . CARDIOVERSION N/A 12/05/2019   Procedure: CARDIOVERSION;  Surgeon: Parke Poisson, MD;  Location: [facility] ENDOSCOPY;  Service: Cardiovascular;  Laterality: N/A;  . COLONOSCOPY WITH PROPOFOL N/A 08/07/2012   Procedure: COLONOSCOPY WITH PROPOFOL;  Surgeon: Charolett Bumpers, MD;  Location: [facility] ENDOSCOPY;  Service: Endoscopy;  Laterality: N/A;  . MOUTH SURGERY     teeth extractions  . TONSILLECTOMY    . TUBAL LIGATION    .  WRIST SURGERY      Current Outpatient Medications  Medication Sig Dispense Refill  . albuterol (VENTOLIN HFA) 108 (90 Base) MCG/ACT inhaler as needed.    Marland Kitchen amLODipine (NORVASC) 5 MG tablet daily in the afternoon.    Marland Kitchen apixaban (ELIQUIS) 5 MG TABS tablet Take 1 tablet (5 mg total) by mouth 2 (two) times daily. 180 tablet 1  . Budeson-Glycopyrrol-Formoterol (BREZTRI AEROSPHERE) 160-9-4.8 MCG/ACT AERO as needed.    . cetirizine  (ZYRTEC) 10 MG tablet as needed.    . diclofenac Sodium (VOLTAREN) 1 % GEL See admin instructions.    . metFORMIN (GLUCOPHAGE-XR) 750 MG 24 hr tablet daily in the afternoon.    . pantoprazole (PROTONIX) 40 MG tablet as needed.    Marland Kitchen rOPINIRole (REQUIP) 4 MG tablet daily in the afternoon.    . rosuvastatin (CRESTOR) 20 MG tablet daily in the afternoon.    . diltiazem (CARDIZEM CD) 180 MG 24 hr capsule Take 1 capsule (180 mg total) by mouth in the morning and at bedtime. 180 capsule 3  . metoprolol tartrate (LOPRESSOR) 100 MG tablet Take 1 tablet (100 mg total) by mouth 2 (two) times daily. 180 tablet 3   No current facility-administered medications for this visit.    Allergies  Allergen Reactions  . Bee Venom Shortness Of Breath and Rash  . Apricot Flavor Swelling    Eyes swell shut  . Atorvastatin Other (See Comments)  . Wasp Venom Other (See Comments)    Social History   Socioeconomic History  . Marital status: Widowed    Spouse name: Not on file  . Number of children: 2  . Years of education: Not on file  . Highest education level: Not on file  Occupational History  . Occupation: Retired-worked at [employer]  . Smoking status: Never Smoker  . Smokeless tobacco: Never Used  Substance and Sexual Activity  . Alcohol use: No  . Drug use: No  . Sexual activity: Not Currently  Other Topics Concern  . Not on file  Social History Narrative  . Not on file   Social Determinants of Health   Financial Resource Strain: Not on file  Food Insecurity: Not on file  Transportation Needs: Not on file  Physical Activity: Not on file  Stress: Not on file  Social Connections: Not on file  Intimate Partner Violence: Not on file    Family History  Problem Relation Age of Onset  . Heart disease Mother   . Stroke Mother   . Cancer Father        Prostate    Review of Systems:  As stated in the HPI and otherwise negative.   BP 126/78   Pulse (!) 121   Ht   (1.727 m)   Wt 212 lb (96.2 kg)   SpO2 97%   BMI 32.23 kg/m   Physical Examination: General: Well developed, well nourished, NAD  HEENT: OP clear, mucus membranes moist  SKIN: warm, dry. No rashes. Neuro: No focal deficits  Musculoskeletal: Muscle strength 5/5 all ext  Psychiatric: Mood and affect normal  Neck: No JVD, no carotid bruits, no thyromegaly, no lymphadenopathy.  Lungs:Clear bilaterally, no wheezes, rhonci, crackles Cardiovascular: Irreg irreg. Loud, harsh, late peaking systolic murmur.  Abdomen:Soft. Bowel sounds present. Non-tender.  Extremities: Trace No lower extremity edema. Pulses are 2 + in the bilateral DP/PT.  EKG:  EKG is ordered today. The ekg ordered today demonstrates Atrial fibrillation, rate 121 bpm  Echo 05/11/20:  1. Left ventricular ejection fraction, by estimation, is 55 to 60%. The  left ventricle has normal function. The left ventricle has no regional  wall motion abnormalities. There is mild left ventricular hypertrophy.  Left ventricular diastolic parameters  are indeterminate.  2. Right ventricular systolic function is normal. The right ventricular  size is normal. There is normal pulmonary artery systolic pressure. The  estimated right ventricular systolic pressure is 29.6 mmHg.  3. Left atrial size was moderately dilated.  4. Right atrial size was moderately dilated.  5. The mitral valve is degenerative. Trivial mitral valve regurgitation.  Moderate mitral annular calcification.  6. The aortic valve is tricuspid. Aortic valve regurgitation is trivial.  Possible paradoxical low flow/low gradient severe aortic valve stenosis,  however patient is in atrial fibrillation with RVR so would repeat echo  when rate is better-controlled.  Aortic valve area, by VTI measures 0.73 cm. Aortic valve mean gradient  measures 29.0 mmHg.  7. The inferior vena cava is normal in size with greater than 50%  respiratory variability, suggesting right  atrial pressure of 3 mmHg.  8. Atrial fibrillation with RVR.   FINDINGS  Left Ventricle: Left ventricular ejection fraction, by estimation, is 55  to 60%. The left ventricle has normal function. The left ventricle has no  regional wall motion abnormalities. The left ventricular internal cavity  size was normal in size. There is  mild left ventricular hypertrophy. Left ventricular diastolic parameters  are indeterminate.   Right Ventricle: The right ventricular size is normal. No increase in  right ventricular wall thickness. Right ventricular systolic function is  normal. There is normal pulmonary artery systolic pressure. The tricuspid  regurgitant velocity is 2.58 m/s, and  with an assumed right atrial pressure of 3 mmHg, the estimated right  ventricular systolic pressure is 29.6 mmHg.   Left Atrium: Left atrial size was moderately dilated.   Right Atrium: Right atrial size was moderately dilated.   Pericardium: There is no evidence of pericardial effusion.   Mitral Valve: The mitral valve is degenerative in appearance. There is  moderate calcification of the mitral valve leaflet(s). Moderate mitral  annular calcification. Trivial mitral valve regurgitation.   Tricuspid Valve: The tricuspid valve is normal in structure. Tricuspid  valve regurgitation is mild.   Aortic Valve: The aortic valve is tricuspid. Aortic valve regurgitation is  trivial. Severe aortic stenosis is present. Aortic valve mean gradient  measures 29.0 mmHg. Aortic valve peak gradient measures 31.3 mmHg. Aortic  valve area, by VTI measures 0.73  cm.   Pulmonic Valve: The pulmonic valve was normal in structure. Pulmonic valve  regurgitation is not visualized.   Aorta: The aortic root is normal in size and structure.   Venous: The inferior vena cava is normal in size with greater than 50%  respiratory variability, suggesting right atrial pressure of 3 mmHg.   IAS/Shunts: No atrial level shunt  detected by color flow Doppler.     LEFT VENTRICLE  PLAX 2D  LVIDd:     4.30 cm  LVIDs:     3.30 cm  LV PW:     1.20 cm  LV IVS:    1.40 cm  LVOT diam:   2.00 cm  LV SV:     39  LV SV Index:  19  LVOT Area:   3.14 cm     RIGHT VENTRICLE      IVC  RVSP:      29.6 mmHg IVC diam: 1.70 cm  LEFT ATRIUM       Index    RIGHT ATRIUM      Index  LA diam:    3.90 cm 1.90 cm/m RA Pressure: 3.00 mmHg  LA Vol (A2C):  106.0 ml 51.66 ml/m RA Area:   18.70 cm  LA Vol (A4C):  79.7 ml 38.84 ml/m RA Volume:  55.40 ml 27.00 ml/m  LA Biplane Vol: 92.3 ml 44.98 ml/m  AORTIC VALVE  AV Area (Vmax):  0.79 cm  AV Area (Vmean):  0.75 cm  AV Area (VTI):   0.73 cm  AV Vmax:      279.60 cm/s  AV Vmean:     206.600 cm/s  AV VTI:      0.536 m  AV Peak Grad:   31.3 mmHg  AV Mean Grad:   29.0 mmHg  LVOT Vmax:     69.88 cm/s  LVOT Vmean:    49.020 cm/s  LVOT VTI:     0.124 m  LVOT/AV VTI ratio: 0.23    AORTA  Ao Root diam: 3.10 cm  Ao Asc diam: 3.20 cm   MV E velocity: 157.40 cm/s TRICUSPID VALVE               TR Peak grad:  26.6 mmHg               TR Vmax:    258.00 cm/s               Estimated RAP: 3.00 mmHg               RVSP:      29.6 mmHg                 SHUNTS               Systemic VTI: 0.12 m               Systemic Diam: 2.00 cm  Recent Labs: 06/09/2020: BUN 11; Creatinine, Ser 0.74; Hemoglobin 12.6; Platelets 229; Potassium 4.1; Sodium 141     Wt Readings from Last 3 Encounters:  06/10/20 212 lb (96.2 kg)  06/09/20 211 lb (95.7 kg)  02/25/20 207 lb (93.9 kg)     Other studies Reviewed: Additional studies/ records that were reviewed today include: echo images, office notes Review of the above records demonstrates: severe AS   STS Risk  Score: Risk of Mortality: 2.308% Renal Failure: 1.788% Permanent Stroke: 2.008% Prolonged Ventilation: 8.167% DSW Infection: 0.150% Reoperation: 2.807% Morbidity or Mortality: 11.080% Short Length of Stay: 29.342% Long Length of Stay: 6.693%    Assessment and Plan:   1. Severe Aortic Valve Stenosis: She likely has severe, stage D3 aortic valve stenosis (paradoxical low flow/low gradient AS). I have personally reviewed the echo images. The aortic valve is thickened, calcified with limited leaflet mobility but the valve does open. She was in rapid atrial fib during the echo. Her atrial fibrillation has been difficult to rate control. Her dyspnea is likely a combination of her atrial fibrillation and aortic stenosis. I think she would benefit from AVR. She would be a candidate for conventional AVR by surgical approach or TAVR.    -Will discuss potentially adding amiodarone for better rate control with Dr. Katrinka Blazing. She is on high doses of Cardizem and Metoprolol.   I have reviewed the natural history of aortic stenosis with the patient and their family members  who are present today. We have discussed the limitations of medical therapy and the  poor prognosis associated with symptomatic aortic stenosis. We have reviewed potential treatment options, including palliative medical therapy, conventional surgical aortic valve replacement, and transcatheter aortic valve replacement. We discussed treatment options in the context of the patient's specific comorbid medical conditions.   She would like to proceed with planning for TAVR. Right and left heart cath at [facility] 06/17/20 per Dr. Katrinka Blazing. Risks and benefits of the cath procedure and the valve procedure are reviewed with the patient. After the cath, she will have a cardiac CT, CTA of the chest/abdomen and pelvis, carotid artery dopplers, PT assessment and will then be referred to see one of the CT surgeons on our TAVR team.     Current medicines are  reviewed at length with the patient today.  The patient does not have concerns regarding medicines.  The following changes have been made:  no change  Labs/ tests ordered today include:   Orders Placed This Encounter  Procedures  . EKG 12-Lead     Disposition:   F/U with the valve team   Signed, [name], MD 06/10/2020 1:05 PM    [facility] [address] [place], [place], [place]  [address] Phone: [phone]; Fax: [phone]

## 2020-06-09 NOTE — Telephone Encounter (Signed)
Called pt and left message letting her know that Dr. Katrinka Blazing would like for her to stop her Amiodarone.  Advised pt to call back or send Mychart message letting me know that she got the message.

## 2020-06-10 ENCOUNTER — Encounter: Payer: Self-pay | Admitting: Cardiovascular Disease

## 2020-06-10 ENCOUNTER — Ambulatory Visit: Payer: Medicare Other | Admitting: Cardiovascular Disease

## 2020-06-10 VITALS — BP 126/78 | HR 121 | Ht 68.0 in | Wt 212.0 lb

## 2020-06-10 DIAGNOSIS — I350 Nonrheumatic aortic (valve) stenosis: Secondary | ICD-10-CM | POA: Diagnosis not present

## 2020-06-10 LAB — BASIC METABOLIC PANEL
BUN/Creatinine Ratio: 15 (ref 12–28)
BUN: 11 mg/dL (ref 8–27)
CO2: 23 mmol/L (ref 20–29)
Calcium: 9.9 mg/dL (ref 8.7–10.3)
Chloride: 104 mmol/L (ref 96–106)
Creatinine, Ser: 0.74 mg/dL (ref 0.57–1.00)
Glucose: 126 mg/dL — ABNORMAL HIGH (ref 65–99)
Potassium: 4.1 mmol/L (ref 3.5–5.2)
Sodium: 141 mmol/L (ref 134–144)
eGFR: 86 mL/min/{1.73_m2} (ref >59)

## 2020-06-10 LAB — CBC
Hematocrit: 37.8 % (ref 34.0–46.6)
Hemoglobin: 12.6 g/dL (ref 11.1–15.9)
MCH: 30.8 pg (ref 26.6–33.0)
MCHC: 33.3 g/dL (ref 31.5–35.7)
MCV: 92 fL (ref 79–97)
Platelets: 229 10*3/uL (ref 150–450)
RBC: 4.09 x10E6/uL (ref 3.77–5.28)
RDW: 12.8 % (ref 11.7–15.4)
WBC: 8.3 10*3/uL (ref 3.4–10.8)

## 2020-06-10 MED ORDER — DILTIAZEM HCL ER COATED BEADS 180 MG PO CP24
180.0000 mg | ORAL_CAPSULE | Freq: Two times a day (BID) | ORAL | 3 refills | Status: DC
Start: 2020-06-10 — End: 2020-12-02

## 2020-06-10 NOTE — Patient Instructions (Signed)
Medication Instructions:  None *If you need a refill on your cardiac medications before your next appointment, please call your pharmacy*   Lab Work: None If you have labs (blood work) drawn today and your tests are completely normal, you will receive your results only by: Marland Kitchen MyChart Message (if you have MyChart) OR . A paper copy in the mail If you have any lab test that is abnormal or we need to change your treatment, we will call you to review the results.   Testing/Procedures: None   Follow-Up: Julieta Gutting, RN Structual Heart Nurse Navigator will contact you to set up the next steps.   Other Instructions

## 2020-06-11 ENCOUNTER — Telehealth: Payer: Self-pay | Admitting: Interventional Cardiology

## 2020-06-11 MED ORDER — AMIODARONE HCL 200 MG PO TABS: ORAL_TABLET | ORAL | 3 refills | Status: DC

## 2020-06-11 NOTE — Telephone Encounter (Signed)
Briana Foster, please have her to start Amiodarone 200 mg BID for 3 weeks then 200 mg daily thereafter.    Hank  Smith   Left message to call back

## 2020-06-11 NOTE — Telephone Encounter (Signed)
Spoke with pt and went over recommendations per Dr. Smith.  Pt agreeable to plan.  

## 2020-06-15 ENCOUNTER — Other Ambulatory Visit (HOSPITAL_COMMUNITY)
Admission: RE | Admit: 2020-06-15 | Discharge: 2020-06-15 | Disposition: A | Discharge status: Home / Self Care | Payer: Medicare Other | Source: Ambulatory Visit | Attending: Interventional Cardiology | Admitting: Interventional Cardiology

## 2020-06-15 DIAGNOSIS — Z20822 Contact with and (suspected) exposure to covid-19: Secondary | ICD-10-CM | POA: Insufficient documentation

## 2020-06-15 DIAGNOSIS — Z01812 Encounter for preprocedural laboratory examination: Secondary | ICD-10-CM | POA: Diagnosis not present

## 2020-06-15 LAB — SARS CORONAVIRUS 2 (TAT 6-24 HRS): SARS Coronavirus 2: NEGATIVE

## 2020-06-16 ENCOUNTER — Telehealth: Payer: Self-pay | Admitting: *Deleted

## 2020-06-16 NOTE — Telephone Encounter (Signed)
LMTCB to review procedure instructions 

## 2020-06-16 NOTE — Telephone Encounter (Signed)
Reviewed procedure/mask/visitor instructions with patient.  Patient reports last Eliquis PM dose on Monday May 16. Pt reports she has not had any Eliquis today,Tuesday May 17, and knows to continue to hold Eliquis until after procedure tomorrow, May 18.

## 2020-06-16 NOTE — Telephone Encounter (Addendum)
Pt contacted pre-catheterization scheduled at [facility] for: Wednesday Jun 17, 2020 8:30 AM Verified arrival time and place: [facility] Main Entrance A [facility]) at: 6:30 AM   No solid food after midnight prior to cath, clear liquids until 5 AM day of procedure.  Hold: Eliquis-none 06/15/20 until post procedure Metformin-day of procedure and 48 hours post procedure  Except hold medications AM meds can be  taken pre-cath with sips of water including: ASA 81 mg   Confirmed patient has responsible adult to drive home post procedure and be with patient first 24 hours after arriving home: yes  You are allowed ONE visitor in the waiting room during the time you are at the hospital for your procedure. Both you and your visitor must wear a mask once you enter the hospital.   [facility] to review procedure instructions with patient.

## 2020-06-16 NOTE — Telephone Encounter (Signed)
PT is returning a call for instructions on her procedure

## 2020-06-17 ENCOUNTER — Encounter (HOSPITAL_COMMUNITY): Payer: Self-pay | Admitting: Interventional Cardiology

## 2020-06-17 ENCOUNTER — Other Ambulatory Visit: Payer: Self-pay

## 2020-06-17 ENCOUNTER — Encounter (HOSPITAL_COMMUNITY)
Admission: RE | Disposition: A | Discharge status: Home / Self Care | Payer: Self-pay | Source: Home / Self Care | Attending: Interventional Cardiology

## 2020-06-17 ENCOUNTER — Ambulatory Visit (HOSPITAL_COMMUNITY)
Admission: RE | Admit: 2020-06-17 | Discharge: 2020-06-17 | Disposition: A | Discharge status: Home / Self Care | Payer: Medicare Other | Attending: Interventional Cardiology | Admitting: Interventional Cardiology

## 2020-06-17 DIAGNOSIS — I10 Essential (primary) hypertension: Secondary | ICD-10-CM | POA: Diagnosis present

## 2020-06-17 DIAGNOSIS — E669 Obesity, unspecified: Secondary | ICD-10-CM | POA: Insufficient documentation

## 2020-06-17 DIAGNOSIS — Z7984 Long term (current) use of oral hypoglycemic drugs: Secondary | ICD-10-CM | POA: Diagnosis not present

## 2020-06-17 DIAGNOSIS — E785 Hyperlipidemia, unspecified: Secondary | ICD-10-CM | POA: Diagnosis not present

## 2020-06-17 DIAGNOSIS — Z888 Allergy status to other drugs, medicaments and biological substances status: Secondary | ICD-10-CM | POA: Diagnosis not present

## 2020-06-17 DIAGNOSIS — I4819 Other persistent atrial fibrillation: Secondary | ICD-10-CM | POA: Diagnosis present

## 2020-06-17 DIAGNOSIS — I4891 Unspecified atrial fibrillation: Secondary | ICD-10-CM | POA: Diagnosis not present

## 2020-06-17 DIAGNOSIS — Z6832 Body mass index (BMI) 32.0-32.9, adult: Secondary | ICD-10-CM | POA: Insufficient documentation

## 2020-06-17 DIAGNOSIS — E1165 Type 2 diabetes mellitus with hyperglycemia: Secondary | ICD-10-CM

## 2020-06-17 DIAGNOSIS — Z8249 Family history of ischemic heart disease and other diseases of the circulatory system: Secondary | ICD-10-CM | POA: Diagnosis not present

## 2020-06-17 DIAGNOSIS — Z7901 Long term (current) use of anticoagulants: Secondary | ICD-10-CM | POA: Diagnosis not present

## 2020-06-17 DIAGNOSIS — I350 Nonrheumatic aortic (valve) stenosis: Secondary | ICD-10-CM | POA: Diagnosis not present

## 2020-06-17 DIAGNOSIS — Z79899 Other long term (current) drug therapy: Secondary | ICD-10-CM | POA: Insufficient documentation

## 2020-06-17 DIAGNOSIS — E119 Type 2 diabetes mellitus without complications: Secondary | ICD-10-CM

## 2020-06-17 HISTORY — PX: RIGHT/LEFT HEART CATH AND CORONARY ANGIOGRAPHY: CATH118266

## 2020-06-17 LAB — GLUCOSE, CAPILLARY
Glucose-Capillary: 158 mg/dL — ABNORMAL HIGH (ref 70–99)
Glucose-Capillary: 136 mg/dL — ABNORMAL HIGH (ref 70–99)

## 2020-06-17 SURGERY — RIGHT/LEFT HEART CATH AND CORONARY ANGIOGRAPHY
Anesthesia: LOCAL

## 2020-06-17 MED ORDER — MIDAZOLAM HCL 2 MG/2ML IJ SOLN
INTRAMUSCULAR | Status: DC | PRN
  Administered 2020-06-17: 1 mg via INTRAVENOUS

## 2020-06-17 MED ORDER — FENTANYL CITRATE (PF) 100 MCG/2ML IJ SOLN
INTRAMUSCULAR | Status: AC
  Filled 2020-06-17: qty 2

## 2020-06-17 MED ORDER — SODIUM CHLORIDE 0.9 % IV SOLN: 250.0000 mL | INTRAVENOUS | Status: DC | PRN

## 2020-06-17 MED ORDER — LABETALOL HCL 5 MG/ML IV SOLN: 10.0000 mg | INTRAVENOUS | Status: DC | PRN

## 2020-06-17 MED ORDER — HEPARIN (PORCINE) IN NACL 1000-0.9 UT/500ML-% IV SOLN
INTRAVENOUS | Status: AC
  Filled 2020-06-17: qty 500

## 2020-06-17 MED ORDER — LIDOCAINE HCL (PF) 1 % IJ SOLN
INTRAMUSCULAR | Status: AC
  Filled 2020-06-17: qty 30

## 2020-06-17 MED ORDER — FENTANYL CITRATE (PF) 100 MCG/2ML IJ SOLN
INTRAMUSCULAR | Status: DC | PRN
  Administered 2020-06-17: 25 ug via INTRAVENOUS

## 2020-06-17 MED ORDER — SODIUM CHLORIDE 0.9% FLUSH: 3.0000 mL | Freq: Two times a day (BID) | INTRAVENOUS | Status: DC

## 2020-06-17 MED ORDER — METOPROLOL TARTRATE 5 MG/5ML IV SOLN
INTRAVENOUS | Status: AC
  Filled 2020-06-17: qty 5

## 2020-06-17 MED ORDER — ONDANSETRON HCL 4 MG/2ML IJ SOLN: 4.0000 mg | Freq: Four times a day (QID) | INTRAMUSCULAR | Status: DC | PRN

## 2020-06-17 MED ORDER — METOPROLOL TARTRATE 5 MG/5ML IV SOLN
INTRAVENOUS | Status: DC | PRN
  Administered 2020-06-17 (×3): 5 mg via INTRAVENOUS

## 2020-06-17 MED ORDER — VERAPAMIL HCL 2.5 MG/ML IV SOLN
INTRAVENOUS | Status: AC
  Filled 2020-06-17: qty 2

## 2020-06-17 MED ORDER — SODIUM CHLORIDE 0.9 % WEIGHT BASED INFUSION
3.0000 mL/kg/h | INTRAVENOUS | Status: AC
  Administered 2020-06-17: 3 mL/kg/h via INTRAVENOUS

## 2020-06-17 MED ORDER — OXYCODONE HCL 5 MG PO TABS: 5.0000 mg | ORAL_TABLET | ORAL | Status: DC | PRN

## 2020-06-17 MED ORDER — HEPARIN (PORCINE) IN NACL 1000-0.9 UT/500ML-% IV SOLN
INTRAVENOUS | Status: DC | PRN
  Administered 2020-06-17 (×2): 500 mL

## 2020-06-17 MED ORDER — IOHEXOL 350 MG/ML SOLN
INTRAVENOUS | Status: DC | PRN
  Administered 2020-06-17: 50 mL

## 2020-06-17 MED ORDER — HEPARIN SODIUM (PORCINE) 1000 UNIT/ML IJ SOLN
INTRAMUSCULAR | Status: DC | PRN
  Administered 2020-06-17: 5000 [IU] via INTRAVENOUS

## 2020-06-17 MED ORDER — SODIUM CHLORIDE 0.9 % IV SOLN: INTRAVENOUS | Status: DC

## 2020-06-17 MED ORDER — SODIUM CHLORIDE 0.9% FLUSH: 3.0000 mL | INTRAVENOUS | Status: DC | PRN

## 2020-06-17 MED ORDER — ASPIRIN 81 MG PO CHEW: 81.0000 mg | CHEWABLE_TABLET | ORAL | Status: DC

## 2020-06-17 MED ORDER — SODIUM CHLORIDE 0.9 % WEIGHT BASED INFUSION: 1.0000 mL/kg/h | INTRAVENOUS | Status: DC

## 2020-06-17 MED ORDER — LIDOCAINE HCL (PF) 1 % IJ SOLN
INTRAMUSCULAR | Status: DC | PRN
  Administered 2020-06-17 (×2): 2 mL

## 2020-06-17 MED ORDER — ELIQUIS 5 MG PO TABS: 5.0000 mg | ORAL_TABLET | Freq: Two times a day (BID) | ORAL | 1 refills | Status: DC

## 2020-06-17 MED ORDER — MIDAZOLAM HCL 2 MG/2ML IJ SOLN
INTRAMUSCULAR | Status: AC
  Filled 2020-06-17: qty 2

## 2020-06-17 MED ORDER — VERAPAMIL HCL 2.5 MG/ML IV SOLN
INTRAVENOUS | Status: DC | PRN
  Administered 2020-06-17: 10 mL via INTRA_ARTERIAL

## 2020-06-17 MED ORDER — HYDRALAZINE HCL 20 MG/ML IJ SOLN: 10.0000 mg | INTRAMUSCULAR | Status: DC | PRN

## 2020-06-17 MED ORDER — ACETAMINOPHEN 325 MG PO TABS: 650.0000 mg | ORAL_TABLET | ORAL | Status: DC | PRN

## 2020-06-17 SURGICAL SUPPLY — 13 items
CATH 5FR JL3.5 JR4 ANG PIG MP (CATHETERS) ×2 IMPLANT
CATH SWAN GANZ 7F STRAIGHT (CATHETERS) ×2 IMPLANT
DEVICE RAD COMP TR BAND LRG (VASCULAR PRODUCTS) ×2 IMPLANT
GLIDESHEATH SLEND A-KIT 6F 22G (SHEATH) ×2 IMPLANT
GLIDESHEATH SLENDER 7FR .021G (SHEATH) ×2 IMPLANT
GUIDEWIRE INQWIRE 1.5J.035X260 (WIRE) ×1 IMPLANT
INQWIRE 1.5J .035X260CM (WIRE) ×2
KIT HEART LEFT (KITS) ×2 IMPLANT
PACK CARDIAC CATHETERIZATION (CUSTOM PROCEDURE TRAY) ×2 IMPLANT
SHEATH PROBE COVER 6X72 (BAG) ×2 IMPLANT
TRANSDUCER W/STOPCOCK (MISCELLANEOUS) ×2 IMPLANT
TUBING CIL FLEX 10 FLL-RA (TUBING) ×2 IMPLANT
WIRE EMERALD ST .035X150CM (WIRE) ×2 IMPLANT

## 2020-06-17 NOTE — Discharge Instructions (Signed)
Radial Site Care  This sheet gives you information about how to care for yourself after your procedure. Your health care provider may also give you more specific instructions. If you have problems or questions, contact your health care provider. What can I expect after the procedure? After the procedure, it is common to have:  Bruising and tenderness at the catheter insertion area. Follow these instructions at home: Medicines  Take over-the-counter and prescription medicines only as told by your health care provider. Insertion site care  Follow instructions from your health care provider about how to take care of your insertion site. Make sure you: ? Wash your hands with soap and water before you change your bandage (dressing). If soap and water are not available, use hand sanitizer. ? Change your dressing as told by your health care provider. ? Leave stitches (sutures), skin glue, or adhesive strips in place. These skin closures may need to stay in place for 2 weeks or longer. If adhesive strip edges start to loosen and curl up, you may trim the loose edges. Do not remove adhesive strips completely unless your health care provider tells you to do that.  Check your insertion site every day for signs of infection. Check for: ? Redness, swelling, or pain. ? Fluid or blood. ? Pus or a bad smell. ? Warmth.  Do not take baths, swim, or use a hot tub until your health care provider approves.  You may shower 24-48 hours after the procedure, or as directed by your health care provider. ? Remove the dressing and gently wash the site with plain soap and water. ? Pat the area dry with a clean towel. ? Do not rub the site. That could cause bleeding.  Do not apply powder or lotion to the site. Activity  For 24 hours after the procedure, or as directed by your health care provider: ? Do not flex or bend the affected arm. ? Do not push or pull heavy objects with the affected arm. ? Do not drive  yourself home from the hospital or clinic. You may drive 24 hours after the procedure unless your health care provider tells you not to. ? Do not operate machinery or power tools.  Do not lift anything that is heavier than 10 lb (4.5 kg), or the limit that you are told, until your health care provider says that it is safe.  Ask your health care provider when it is okay to: ? Return to work or school. ? Resume usual physical activities or sports. ? Resume sexual activity.   General instructions  If the catheter site starts to bleed, raise your arm and put firm pressure on the site. If the bleeding does not stop, get help right away. This is a medical emergency.  If you went home on the same day as your procedure, a responsible adult should be with you for the first 24 hours after you arrive home.  Keep all follow-up visits as told by your health care provider. This is important. Contact a health care provider if:  You have a fever.  You have redness, swelling, or yellow drainage around your insertion site. Get help right away if:  You have unusual pain at the radial site.  The catheter insertion area swells very fast.  The insertion area is bleeding, and the bleeding does not stop when you hold steady pressure on the area.  Your arm or hand becomes pale, cool, tingly, or numb. These symptoms may represent a serious   problem that is an emergency. Do not wait to see if the symptoms will go away. Get medical help right away. Call your local emergency services (911 in the [place] Do not drive yourself to the hospital. Summary  After the procedure, it is common to have bruising and tenderness at the site.  Follow instructions from your health care provider about how to take care of your radial site wound. Check the wound every day for signs of infection.  Do not lift anything that is heavier than 10 lb (4.5 kg), or the limit that you are told, until your health care provider says that it  is safe. This information is not intended to replace advice given to you by your health care provider. Make sure you discuss any questions you have with your health care provider. Document Revised: 02/22/2017 Document Reviewed: 02/22/2017 [employer]vier Patient Education  2021 [employer]   Start Eliquis at 8 PM this evening.

## 2020-06-17 NOTE — Interval H&P Note (Signed)
Cath Lab Visit (complete for each Cath Lab visit)  Clinical Evaluation Leading to the Procedure:   ACS: No.  Non-ACS:    Anginal Classification: CCS III  Anti-ischemic medical therapy: Minimal Therapy (1 class of medications)  Non-Invasive Test Results: No non-invasive testing performed  Prior CABG: No previous CABG      History and Physical Interval Note:  06/17/2020 7:43 AM  Briana Foster  has presented today for surgery, with the diagnosis of aortic stenosis.  The various methods of treatment have been discussed with the patient and family. After consideration of risks, benefits and other options for treatment, the patient has consented to  Procedure(s): RIGHT/LEFT HEART CATH AND CORONARY ANGIOGRAPHY (N/A) as a surgical intervention.  The patient's history has been reviewed, patient examined, no change in status, stable for surgery.  I have reviewed the patient's chart and labs.  Questions were answered to the patient's satisfaction.     Lyn Records III

## 2020-06-17 NOTE — CV Procedure (Signed)
   Patient did not take rate controlling medicines this morning and has significant atrial fibrillation with tachycardia on the cath table.  15 mg of Lopressor was given IV over 30 minutes to bring the heart rate down into the 90 to 115 bpm range.  Angiography demonstrated widely patent coronaries.  Co. oximetry demonstrated a PA saturation of 52% calculated cardiac output 4.2 L/min pulmonary vascular resistance 5.3 Wood units and a roughly 20 mm gradient was calculated across the aortic valve  Peak to peak aortic valve gradient is difficult to assess due to A. fib with relatively rapid rate.  Eyeball approximation is 20 mmHg.  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

## 2020-06-18 LAB — POCT I-STAT 7, (LYTES, BLD GAS, ICA,H+H)
Acid-Base Excess: 0 mmol/L (ref 0.0–2.0)
Bicarbonate: 24.5 mmol/L (ref 20.0–28.0)
Calcium, Ion: 1.25 mmol/L (ref 1.15–1.40)
HCT: 34 % — ABNORMAL LOW (ref 36.0–46.0)
Hemoglobin: 11.6 g/dL — ABNORMAL LOW (ref 12.0–15.0)
O2 Saturation: 91 %
Potassium: 3.5 mmol/L (ref 3.5–5.1)
Sodium: 142 mmol/L (ref 135–145)
TCO2: 26 mmol/L (ref 22–32)
pCO2 arterial: 40.3 mmHg (ref 32.0–48.0)
pH, Arterial: 7.393 (ref 7.350–7.450)
pO2, Arterial: 61 mmHg — ABNORMAL LOW (ref 83.0–108.0)

## 2020-06-18 LAB — POCT I-STAT EG7
Acid-Base Excess: 0 mmol/L (ref 0.0–2.0)
Acid-Base Excess: 0 mmol/L (ref 0.0–2.0)
Bicarbonate: 25.7 mmol/L (ref 20.0–28.0)
Bicarbonate: 26.1 mmol/L (ref 20.0–28.0)
Calcium, Ion: 1.27 mmol/L (ref 1.15–1.40)
Calcium, Ion: 1.25 mmol/L (ref 1.15–1.40)
HCT: 35 % — ABNORMAL LOW (ref 36.0–46.0)
HCT: 35 % — ABNORMAL LOW (ref 36.0–46.0)
Hemoglobin: 11.9 g/dL — ABNORMAL LOW (ref 12.0–15.0)
Hemoglobin: 11.9 g/dL — ABNORMAL LOW (ref 12.0–15.0)
O2 Saturation: 52 %
O2 Saturation: 52 %
Potassium: 3.4 mmol/L — ABNORMAL LOW (ref 3.5–5.1)
Potassium: 3.4 mmol/L — ABNORMAL LOW (ref 3.5–5.1)
Sodium: 142 mmol/L (ref 135–145)
Sodium: 143 mmol/L (ref 135–145)
TCO2: 27 mmol/L (ref 22–32)
TCO2: 28 mmol/L (ref 22–32)
pCO2, Ven: 45.8 mmHg (ref 44.0–60.0)
pCO2, Ven: 47 mmHg (ref 44.0–60.0)
pH, Ven: 7.358 (ref 7.250–7.430)
pH, Ven: 7.353 (ref 7.250–7.430)
pO2, Ven: 29 mmHg — CL (ref 32.0–45.0)
pO2, Ven: 29 mmHg — CL (ref 32.0–45.0)

## 2020-06-20 ENCOUNTER — Inpatient Hospital Stay (HOSPITAL_COMMUNITY)
Admission: EM | Admit: 2020-06-20 | Discharge: 2020-06-24 | DRG: 310 | Disposition: A | Discharge status: Home / Self Care | Payer: Medicare Other | Attending: Cardiology | Admitting: Cardiology

## 2020-06-20 DIAGNOSIS — I350 Nonrheumatic aortic (valve) stenosis: Secondary | ICD-10-CM | POA: Diagnosis not present

## 2020-06-20 DIAGNOSIS — Z20822 Contact with and (suspected) exposure to covid-19: Secondary | ICD-10-CM | POA: Diagnosis not present

## 2020-06-20 DIAGNOSIS — Q2730 Arteriovenous malformation, site unspecified: Secondary | ICD-10-CM | POA: Diagnosis not present

## 2020-06-20 DIAGNOSIS — G4733 Obstructive sleep apnea (adult) (pediatric): Secondary | ICD-10-CM | POA: Diagnosis not present

## 2020-06-20 DIAGNOSIS — I499 Cardiac arrhythmia, unspecified: Secondary | ICD-10-CM | POA: Diagnosis not present

## 2020-06-20 DIAGNOSIS — J45909 Unspecified asthma, uncomplicated: Secondary | ICD-10-CM | POA: Diagnosis not present

## 2020-06-20 DIAGNOSIS — Z7984 Long term (current) use of oral hypoglycemic drugs: Secondary | ICD-10-CM

## 2020-06-20 DIAGNOSIS — R0902 Hypoxemia: Secondary | ICD-10-CM | POA: Diagnosis not present

## 2020-06-20 DIAGNOSIS — Z7901 Long term (current) use of anticoagulants: Secondary | ICD-10-CM

## 2020-06-20 DIAGNOSIS — I10 Essential (primary) hypertension: Secondary | ICD-10-CM | POA: Diagnosis present

## 2020-06-20 DIAGNOSIS — Z6831 Body mass index (BMI) 31.0-31.9, adult: Secondary | ICD-10-CM | POA: Diagnosis not present

## 2020-06-20 DIAGNOSIS — R5383 Other fatigue: Secondary | ICD-10-CM | POA: Diagnosis not present

## 2020-06-20 DIAGNOSIS — M069 Rheumatoid arthritis, unspecified: Secondary | ICD-10-CM | POA: Diagnosis not present

## 2020-06-20 DIAGNOSIS — E669 Obesity, unspecified: Secondary | ICD-10-CM | POA: Diagnosis present

## 2020-06-20 DIAGNOSIS — E1165 Type 2 diabetes mellitus with hyperglycemia: Secondary | ICD-10-CM

## 2020-06-20 DIAGNOSIS — I4821 Permanent atrial fibrillation: Secondary | ICD-10-CM | POA: Diagnosis not present

## 2020-06-20 DIAGNOSIS — Z952 Presence of prosthetic heart valve: Secondary | ICD-10-CM

## 2020-06-20 DIAGNOSIS — D6489 Other specified anemias: Secondary | ICD-10-CM | POA: Diagnosis present

## 2020-06-20 DIAGNOSIS — I4891 Unspecified atrial fibrillation: Secondary | ICD-10-CM | POA: Diagnosis present

## 2020-06-20 DIAGNOSIS — K219 Gastro-esophageal reflux disease without esophagitis: Secondary | ICD-10-CM | POA: Diagnosis present

## 2020-06-20 DIAGNOSIS — Z79899 Other long term (current) drug therapy: Secondary | ICD-10-CM

## 2020-06-20 DIAGNOSIS — E785 Hyperlipidemia, unspecified: Secondary | ICD-10-CM | POA: Diagnosis not present

## 2020-06-20 DIAGNOSIS — I6523 Occlusion and stenosis of bilateral carotid arteries: Secondary | ICD-10-CM | POA: Diagnosis not present

## 2020-06-20 DIAGNOSIS — E119 Type 2 diabetes mellitus without complications: Secondary | ICD-10-CM | POA: Diagnosis present

## 2020-06-20 DIAGNOSIS — Z743 Need for continuous supervision: Secondary | ICD-10-CM | POA: Diagnosis not present

## 2020-06-20 DIAGNOSIS — R531 Weakness: Secondary | ICD-10-CM | POA: Diagnosis not present

## 2020-06-20 DIAGNOSIS — E66811 Obesity, class 1: Secondary | ICD-10-CM

## 2020-06-20 DIAGNOSIS — R0602 Shortness of breath: Secondary | ICD-10-CM | POA: Diagnosis not present

## 2020-06-20 DIAGNOSIS — R6889 Other general symptoms and signs: Secondary | ICD-10-CM | POA: Diagnosis not present

## 2020-06-20 DIAGNOSIS — E7800 Pure hypercholesterolemia, unspecified: Secondary | ICD-10-CM | POA: Diagnosis present

## 2020-06-20 DIAGNOSIS — E559 Vitamin D deficiency, unspecified: Secondary | ICD-10-CM | POA: Diagnosis not present

## 2020-06-20 DIAGNOSIS — G2581 Restless legs syndrome: Secondary | ICD-10-CM | POA: Diagnosis present

## 2020-06-21 ENCOUNTER — Other Ambulatory Visit: Payer: Self-pay

## 2020-06-21 ENCOUNTER — Encounter (HOSPITAL_COMMUNITY): Payer: Self-pay | Admitting: Emergency Medicine

## 2020-06-21 ENCOUNTER — Emergency Department (HOSPITAL_COMMUNITY): Payer: Medicare Other

## 2020-06-21 DIAGNOSIS — G2581 Restless legs syndrome: Secondary | ICD-10-CM | POA: Diagnosis present

## 2020-06-21 DIAGNOSIS — R5383 Other fatigue: Secondary | ICD-10-CM | POA: Diagnosis not present

## 2020-06-21 DIAGNOSIS — E7800 Pure hypercholesterolemia, unspecified: Secondary | ICD-10-CM | POA: Diagnosis present

## 2020-06-21 DIAGNOSIS — Z952 Presence of prosthetic heart valve: Secondary | ICD-10-CM | POA: Diagnosis not present

## 2020-06-21 DIAGNOSIS — Z7984 Long term (current) use of oral hypoglycemic drugs: Secondary | ICD-10-CM | POA: Diagnosis not present

## 2020-06-21 DIAGNOSIS — E559 Vitamin D deficiency, unspecified: Secondary | ICD-10-CM | POA: Diagnosis present

## 2020-06-21 DIAGNOSIS — G4733 Obstructive sleep apnea (adult) (pediatric): Secondary | ICD-10-CM | POA: Diagnosis present

## 2020-06-21 DIAGNOSIS — D6489 Other specified anemias: Secondary | ICD-10-CM | POA: Diagnosis present

## 2020-06-21 DIAGNOSIS — I350 Nonrheumatic aortic (valve) stenosis: Secondary | ICD-10-CM | POA: Diagnosis present

## 2020-06-21 DIAGNOSIS — I4821 Permanent atrial fibrillation: Secondary | ICD-10-CM | POA: Diagnosis present

## 2020-06-21 DIAGNOSIS — Z7901 Long term (current) use of anticoagulants: Secondary | ICD-10-CM | POA: Diagnosis not present

## 2020-06-21 DIAGNOSIS — I4891 Unspecified atrial fibrillation: Secondary | ICD-10-CM | POA: Diagnosis not present

## 2020-06-21 DIAGNOSIS — K219 Gastro-esophageal reflux disease without esophagitis: Secondary | ICD-10-CM | POA: Diagnosis present

## 2020-06-21 DIAGNOSIS — M069 Rheumatoid arthritis, unspecified: Secondary | ICD-10-CM | POA: Diagnosis present

## 2020-06-21 DIAGNOSIS — Z79899 Other long term (current) drug therapy: Secondary | ICD-10-CM | POA: Diagnosis not present

## 2020-06-21 DIAGNOSIS — I6523 Occlusion and stenosis of bilateral carotid arteries: Secondary | ICD-10-CM | POA: Diagnosis present

## 2020-06-21 DIAGNOSIS — E669 Obesity, unspecified: Secondary | ICD-10-CM | POA: Diagnosis present

## 2020-06-21 DIAGNOSIS — Z20822 Contact with and (suspected) exposure to covid-19: Secondary | ICD-10-CM | POA: Diagnosis present

## 2020-06-21 DIAGNOSIS — I10 Essential (primary) hypertension: Secondary | ICD-10-CM | POA: Diagnosis present

## 2020-06-21 DIAGNOSIS — J45909 Unspecified asthma, uncomplicated: Secondary | ICD-10-CM | POA: Diagnosis present

## 2020-06-21 DIAGNOSIS — E785 Hyperlipidemia, unspecified: Secondary | ICD-10-CM | POA: Diagnosis present

## 2020-06-21 DIAGNOSIS — E119 Type 2 diabetes mellitus without complications: Secondary | ICD-10-CM | POA: Diagnosis present

## 2020-06-21 DIAGNOSIS — Z6831 Body mass index (BMI) 31.0-31.9, adult: Secondary | ICD-10-CM | POA: Diagnosis not present

## 2020-06-21 LAB — CBC WITH DIFFERENTIAL/PLATELET
Abs Immature Granulocytes: 0.05 10*3/uL (ref 0.00–0.07)
Basophils Absolute: 0.1 10*3/uL (ref 0.0–0.1)
Basophils Relative: 1 %
Eosinophils Absolute: 0.2 10*3/uL (ref 0.0–0.5)
Eosinophils Relative: 2 %
HCT: 38.3 % (ref 36.0–46.0)
Hemoglobin: 12.5 g/dL (ref 12.0–15.0)
Immature Granulocytes: 1 %
Lymphocytes Relative: 24 %
Lymphs Abs: 2.4 10*3/uL (ref 0.7–4.0)
MCH: 31.7 pg (ref 26.0–34.0)
MCHC: 32.6 g/dL (ref 30.0–36.0)
MCV: 97.2 fL (ref 80.0–100.0)
Monocytes Absolute: 1.1 10*3/uL — ABNORMAL HIGH (ref 0.1–1.0)
Monocytes Relative: 11 %
Neutro Abs: 6.3 10*3/uL (ref 1.7–7.7)
Neutrophils Relative %: 61 %
Platelets: 160 10*3/uL (ref 150–400)
RBC: 3.94 MIL/uL (ref 3.87–5.11)
RDW: 14.1 % (ref 11.5–15.5)
WBC: 10.1 10*3/uL (ref 4.0–10.5)
nRBC: 0 % (ref 0.0–0.2)

## 2020-06-21 LAB — RESP PANEL BY RT-PCR (FLU A&B, COVID) ARPGX2
Influenza A by PCR: NEGATIVE
Influenza B by PCR: NEGATIVE
SARS Coronavirus 2 by RT PCR: NEGATIVE

## 2020-06-21 LAB — COMPREHENSIVE METABOLIC PANEL
ALT: 42 U/L (ref 0–44)
AST: 63 U/L — ABNORMAL HIGH (ref 15–41)
Albumin: 3.9 g/dL (ref 3.5–5.0)
Alkaline Phosphatase: 67 U/L (ref 38–126)
Anion gap: 8 (ref 5–15)
BUN: 16 mg/dL (ref 8–23)
CO2: 23 mmol/L (ref 22–32)
Calcium: 9.4 mg/dL (ref 8.9–10.3)
Chloride: 107 mmol/L (ref 98–111)
Creatinine, Ser: 0.99 mg/dL (ref 0.44–1.00)
GFR, Estimated: >60 mL/min (ref >60)
Glucose, Bld: 152 mg/dL — ABNORMAL HIGH (ref 70–99)
Potassium: 6 mmol/L — ABNORMAL HIGH (ref 3.5–5.1)
Sodium: 138 mmol/L (ref 135–145)
Total Bilirubin: 1.5 mg/dL — ABNORMAL HIGH (ref 0.3–1.2)
Total Protein: 6.9 g/dL (ref 6.5–8.1)

## 2020-06-21 LAB — GLUCOSE, CAPILLARY
Glucose-Capillary: 167 mg/dL — ABNORMAL HIGH (ref 70–99)
Glucose-Capillary: 176 mg/dL — ABNORMAL HIGH (ref 70–99)

## 2020-06-21 LAB — URINALYSIS, ROUTINE W REFLEX MICROSCOPIC
Bilirubin Urine: NEGATIVE
Glucose, UA: NEGATIVE mg/dL
Hgb urine dipstick: NEGATIVE
Ketones, ur: NEGATIVE mg/dL
Leukocytes,Ua: NEGATIVE
Nitrite: NEGATIVE
Protein, ur: NEGATIVE mg/dL
Specific Gravity, Urine: 1.025 (ref 1.005–1.030)
pH: 5 (ref 5.0–8.0)

## 2020-06-21 LAB — TROPONIN I (HIGH SENSITIVITY)
Troponin I (High Sensitivity): 8 ng/L (ref <18)
Troponin I (High Sensitivity): 7 ng/L (ref <18)

## 2020-06-21 LAB — BRAIN NATRIURETIC PEPTIDE: B Natriuretic Peptide: 250.5 pg/mL — ABNORMAL HIGH (ref 0.0–100.0)

## 2020-06-21 LAB — CBG MONITORING, ED: Glucose-Capillary: 183 mg/dL — ABNORMAL HIGH (ref 70–99)

## 2020-06-21 LAB — POTASSIUM: Potassium: 3.7 mmol/L (ref 3.5–5.1)

## 2020-06-21 MED ORDER — ACETAMINOPHEN 325 MG PO TABS
650.0000 mg | ORAL_TABLET | ORAL | Status: DC | PRN
  Administered 2020-06-23: 650 mg via ORAL
  Filled 2020-06-21: qty 2

## 2020-06-21 MED ORDER — DILTIAZEM LOAD VIA INFUSION
10.0000 mg | Freq: Once | INTRAVENOUS | Status: AC
  Administered 2020-06-21: 10 mg via INTRAVENOUS
  Filled 2020-06-21: qty 10

## 2020-06-21 MED ORDER — ROPINIROLE HCL 1 MG PO TABS
4.0000 mg | ORAL_TABLET | Freq: Every day | ORAL | Status: DC
  Administered 2020-06-21 – 2020-06-23 (×3): 4 mg via ORAL
  Filled 2020-06-21 (×4): qty 4

## 2020-06-21 MED ORDER — METOPROLOL TARTRATE 50 MG PO TABS
50.0000 mg | ORAL_TABLET | Freq: Four times a day (QID) | ORAL | Status: DC
  Administered 2020-06-21 – 2020-06-23 (×7): 50 mg via ORAL
  Filled 2020-06-21 (×7): qty 1

## 2020-06-21 MED ORDER — ONDANSETRON HCL 4 MG/2ML IJ SOLN: 4.0000 mg | Freq: Four times a day (QID) | INTRAMUSCULAR | Status: DC | PRN

## 2020-06-21 MED ORDER — APIXABAN 5 MG PO TABS
5.0000 mg | ORAL_TABLET | Freq: Two times a day (BID) | ORAL | Status: DC
  Administered 2020-06-21 – 2020-06-24 (×7): 5 mg via ORAL
  Filled 2020-06-21 (×7): qty 1

## 2020-06-21 MED ORDER — ALBUTEROL SULFATE (2.5 MG/3ML) 0.083% IN NEBU
5.0000 mg | INHALATION_SOLUTION | Freq: Once | RESPIRATORY_TRACT | Status: AC
  Administered 2020-06-21: 5 mg via RESPIRATORY_TRACT
  Filled 2020-06-21: qty 6

## 2020-06-21 MED ORDER — ALBUTEROL SULFATE (2.5 MG/3ML) 0.083% IN NEBU
INHALATION_SOLUTION | RESPIRATORY_TRACT | Status: AC
  Filled 2020-06-21: qty 3

## 2020-06-21 MED ORDER — METOPROLOL TARTRATE 50 MG PO TABS
50.0000 mg | ORAL_TABLET | Freq: Every day | ORAL | Status: DC
  Administered 2020-06-21: 50 mg via ORAL
  Filled 2020-06-21: qty 2

## 2020-06-21 MED ORDER — DILTIAZEM HCL-DEXTROSE 125-5 MG/125ML-% IV SOLN (PREMIX)
5.0000 mg/h | INTRAVENOUS | Status: DC
  Administered 2020-06-21: 5 mg/h via INTRAVENOUS
  Filled 2020-06-21: qty 125

## 2020-06-21 MED ORDER — IPRATROPIUM-ALBUTEROL 0.5-2.5 (3) MG/3ML IN SOLN
3.0000 mL | Freq: Once | RESPIRATORY_TRACT | Status: AC
  Administered 2020-06-21: 3 mL via RESPIRATORY_TRACT
  Filled 2020-06-21: qty 3

## 2020-06-21 MED ORDER — INSULIN ASPART 100 UNIT/ML IJ SOLN
0.0000 [IU] | Freq: Three times a day (TID) | INTRAMUSCULAR | Status: DC
  Administered 2020-06-21 – 2020-06-22 (×3): 3 [IU] via SUBCUTANEOUS
  Administered 2020-06-22 (×2): 2 [IU] via SUBCUTANEOUS
  Administered 2020-06-23 (×3): 3 [IU] via SUBCUTANEOUS
  Administered 2020-06-24: 2 [IU] via SUBCUTANEOUS
  Administered 2020-06-24: 3 [IU] via SUBCUTANEOUS

## 2020-06-21 MED ORDER — AMIODARONE HCL IN DEXTROSE 360-4.14 MG/200ML-% IV SOLN: 30.0000 mg/h | INTRAVENOUS | Status: DC

## 2020-06-21 MED ORDER — IOHEXOL 350 MG/ML SOLN
100.0000 mL | Freq: Once | INTRAVENOUS | Status: AC | PRN
  Administered 2020-06-21: 100 mL via INTRAVENOUS

## 2020-06-21 MED ORDER — ROSUVASTATIN CALCIUM 20 MG PO TABS
20.0000 mg | ORAL_TABLET | Freq: Every day | ORAL | Status: DC
  Administered 2020-06-21 – 2020-06-24 (×4): 20 mg via ORAL
  Filled 2020-06-21 (×4): qty 1

## 2020-06-21 MED ORDER — DILTIAZEM HCL ER COATED BEADS 180 MG PO CP24
180.0000 mg | ORAL_CAPSULE | Freq: Every day | ORAL | Status: DC
  Administered 2020-06-21 – 2020-06-22 (×2): 180 mg via ORAL
  Filled 2020-06-21 (×2): qty 1

## 2020-06-21 MED ORDER — PANTOPRAZOLE SODIUM 40 MG PO TBEC
40.0000 mg | DELAYED_RELEASE_TABLET | Freq: Every day | ORAL | Status: DC | PRN
  Filled 2020-06-21: qty 1

## 2020-06-21 MED ORDER — AMIODARONE HCL IN DEXTROSE 360-4.14 MG/200ML-% IV SOLN
60.0000 mg/h | INTRAVENOUS | Status: AC
  Administered 2020-06-21: 60 mg/h via INTRAVENOUS
  Filled 2020-06-21 (×2): qty 200

## 2020-06-21 MED ORDER — ALBUTEROL SULFATE HFA 108 (90 BASE) MCG/ACT IN AERS
2.0000 | INHALATION_SPRAY | RESPIRATORY_TRACT | Status: DC | PRN
  Administered 2020-06-22: 2 via RESPIRATORY_TRACT
  Filled 2020-06-21: qty 6.7

## 2020-06-21 NOTE — ED Notes (Signed)
Ambulated Pt in hallway, standby assist, with pulse ox. Maintained O2 sats at 94%.

## 2020-06-21 NOTE — Progress Notes (Signed)
MD on call made aware of pt having multiple pauses, most recent was 2.63.Pt asymptomatic, BP 110/70 HR 63. No new orders given, will continue to monitor.   Carri Spillers M

## 2020-06-21 NOTE — ED Notes (Signed)
Pt eating breakfast at this time.  

## 2020-06-21 NOTE — H&P (Addendum)
Cardiology Admission History and Physical:   Patient ID: Briana Foster MRN: [id number]; DOB: March 15, 1945   Admission date: 06/20/2020  Primary Care Provider: Georgann Housekeeper, MD [facility] Cardiologist: Lesleigh Noe, MD  [facility] Electrophysiologist:  Lanier Prude, MD   Chief Complaint: fatigue  Patient Profile:   Briana Foster is a 75 y.o. female with paradoxical AS, pAF, anemia, HLD, RA, GERD, HTN, and DM2 who presents with acute on chronic fatigue/weakness.   History of Present Illness:   Briana Foster was seen by cardiology on 05/11 for severe AS and was found to be in AF/RVR.She went for RHC/cors on 05/18 with Dr. Katrinka Blazing for preop TAVR/SAVR eval and had held her rate control meds so she went into recurrent AF/RVR while undergoing her catheterization which was treated with IV metoprolol.  Valvular evaluation during the heart catheterization revealed paradoxical low flow/low gradient severe AS.  Following discharge home she was feeling back to her normal self on Thursday and Friday.  Her daughter lives with her at home and says she had profound fatigue and weakness on Saturday morning without any inciting cause.  She denied any new symptoms of chest pain/pressure, orthopnea, PND, lower extremity swelling, or infectious symptoms.  She has not been around anybody that has been sick although she did report a nonproductive cough as well on Saturday.  Briana Foster and her daughter both agree that she was very weak and significantly more fatigued than her baseline.  She felt like she just could not get back to her normal self.  She also felt mild lightheadedness but denied shortness of breath or dyspnea on exertion.  During EMS evaluation she was found to be in A. fib although not in RVR and her blood pressure was normotensive.  She was not hypoxic at home but was brought in by EMS given the AF with associated weakness.  Past Medical History:  Diagnosis Date  . Anemia   .  Arthritis 07-20-12   hands  . Asthma   . Back pain   . Cardiomegaly   . Class 1 obesity with serious comorbidity and body mass index (BMI) of 31.0 to 31.9 in adult 03/26/2018  . Depression   . Essential hypertension 03/26/2018  . GERD (gastroesophageal reflux disease)    "comes and goes" - no meds currently  . Heart murmur    told as teenager  . Hyperlipidemia   . Joint pain   . Restless leg syndrome   . Rheumatoid arthritis (HCC)   . Shortness of breath   . Tiredness   . Type 2 diabetes mellitus without complication, without long-term current use of insulin (HCC) 04/10/2018  . Vitamin D deficiency    Past Surgical History:  Procedure Laterality Date  . BUNIONECTOMY  20 yrs ago   bil feet  . BUNIONECTOMY  11/30/2011   Procedure: Arbutus Leas;  Surgeon: Drucilla Schmidt, MD;  Location: [facility]S;  Service: Orthopedics;  Laterality: Bilateral;  RIGHT FOOT EXCISION OF BUNIONETTE AND PARTIAL   PROXIMAL PHALANGECTOMY OF 5TH TOE LEFT FOOT FUNK BUNIONECTOMY,EXCISION OF BUNIONETTE   . CARDIOVERSION N/A 11/05/2019   Procedure: CARDIOVERSION;  Surgeon: Lewayne Bunting, MD;  Location: [facility] ENDOSCOPY;  Service: Cardiovascular;  Laterality: N/A;  . CARDIOVERSION N/A 12/05/2019   Procedure: CARDIOVERSION;  Surgeon: Parke Poisson, MD;  Location: [facility] ENDOSCOPY;  Service: Cardiovascular;  Laterality: N/A;  . COLONOSCOPY WITH PROPOFOL N/A 08/07/2012   Procedure: COLONOSCOPY WITH PROPOFOL;  Surgeon: Charolett Bumpers, MD;  Location: [facility] ENDOSCOPY;  Service: Endoscopy;  Laterality: N/A;  . MOUTH SURGERY     teeth extractions  . RIGHT/LEFT HEART CATH AND CORONARY ANGIOGRAPHY N/A 06/17/2020   Procedure: RIGHT/LEFT HEART CATH AND CORONARY ANGIOGRAPHY;  Surgeon: Lyn Records, MD;  Location: [facility] INVASIVE CV LAB;  Service: Cardiovascular;  Laterality: N/A;  . TONSILLECTOMY    . TUBAL LIGATION    . WRIST SURGERY      Medications Prior to Admission: Prior to Admission medications   Medication Sig Start Date  End Date Taking? Authorizing Provider  albuterol (VENTOLIN HFA) 108 (90 Base) MCG/ACT inhaler Inhale 2 puffs into the lungs every 4 (four) hours as needed for wheezing or shortness of breath.   Yes [provider]  amiodarone (PACERONE) 200 MG tablet Take one tablet by mouth twice daily for 3 weeks, then decrease to one tablet by mouth daily. Patient taking differently: Take 200 mg by mouth daily. 06/11/20  Yes Lyn Records, MD  amLODipine (NORVASC) 5 MG tablet Take 5 mg by mouth daily in the afternoon.   Yes [provider]  apixaban (ELIQUIS) 5 MG TABS tablet Take 1 tablet (5 mg total) by mouth 2 (two) times daily. Resume at 8 PM tonight Patient taking differently: Take 5 mg by mouth 2 (two) times daily. 06/17/20  Yes Lyn Records, MD  cetirizine (ZYRTEC) 10 MG tablet Take 10 mg by mouth daily as needed for allergies.   Yes [provider]  diclofenac Sodium (VOLTAREN) 1 % GEL Apply 1 application topically 2 (two) times daily as needed (pain). 02/21/20  Yes [provider]  diltiazem (CARDIZEM CD) 180 MG 24 hr capsule Take 1 capsule (180 mg total) by mouth in the morning and at bedtime. 06/10/20  Yes Kathleene Hazel, MD  metFORMIN (GLUCOPHAGE-XR) 750 MG 24 hr tablet Take 750 mg by mouth daily in the afternoon. 01/08/20  Yes [provider]  metoprolol tartrate (LOPRESSOR) 50 MG tablet Take 50 mg by mouth daily.   Yes [provider]  Multiple Vitamins-Minerals (MULTIVITAMIN WITH MINERALS) tablet Take 1 tablet by mouth daily.   Yes [provider]  Naphazoline-Pheniramine (ALLERGY EYE OP) Place 1 drop into both eyes daily as needed (red/itchy eyes).   Yes [provider]  pantoprazole (PROTONIX) 40 MG tablet Take 40 mg by mouth daily as needed (heartburn).   Yes [provider]  rOPINIRole (REQUIP) 4 MG tablet Take 4 mg by mouth at bedtime.   Yes [provider]  rosuvastatin (CRESTOR) 20 MG tablet Take  20 mg by mouth daily in the afternoon.   Yes [provider]  vitamin C (ASCORBIC ACID) 500 MG tablet Take 500 mg by mouth daily.   Yes [provider]    Allergies:    Allergies  Allergen Reactions  . Bee Venom Shortness Of Breath and Rash  . Apricot Flavor Swelling    Eyes swell shut  . Atorvastatin Other (See Comments)  . Wasp Venom Other (See Comments)   Social History:   Social History   Socioeconomic History  . Marital status: Widowed    Spouse name: Not on file  . Number of children: 2  . Years of education: Not on file  . Highest education level: Not on file  Occupational History  . Occupation: Retired-worked at [employer]  . Smoking status: Never Smoker  . Smokeless tobacco: Never Used  Substance and Sexual Activity  . Alcohol use: No  .  Drug use: No  . Sexual activity: Not Currently  Other Topics Concern  . Not on file  Social History Narrative  . Not on file   Social Determinants of Health   Financial Resource Strain: Not on file  Food Insecurity: Not on file  Transportation Needs: Not on file  Physical Activity: Not on file  Stress: Not on file  Social Connections: Not on file  Intimate Partner Violence: Not on file    Family History:   The patient's family history includes Cancer in her father; Heart disease in her mother; Stroke in her mother.    ROS:   Review of Systems: [y] = yes, [ ]  = no       General: Weight gain [ ] ; Weight loss [ ] ; Anorexia [ ] ; Fatigue [ ] ; Fever [ ] ; Chills [ ] ; Weakness [y]    Cardiac: Chest pain/pressure [ ] ; Resting SOB [ ] ; Exertional SOB [ ] ; Orthopnea [ ] ; Pedal Edema [ ] ; Palpitations [ ] ; Syncope [ ] ; Presyncope [ ] ; Paroxysmal nocturnal dyspnea [ ]     Pulmonary: Cough [ ] ; Wheezing [ ] ; Hemoptysis [ ] ; Sputum [ ] ; Snoring [ ]     GI: Vomiting [ ] ; Dysphagia [ ] ; Melena [ ] ; Hematochezia [ ] ; Heartburn [ ] ; Abdominal pain [ ] ; Constipation [ ] ; Diarrhea [ ] ; BRBPR [ ]     GU:  Hematuria [ ] ; Dysuria [ ] ; Nocturia [ ]   Vascular: Pain in legs with walking [ ] ; Pain in feet with lying flat [ ] ; Non-healing sores [ ] ; Stroke [ ] ; TIA [ ] ; Slurred speech [ ] ;    Neuro: Headaches [ ] ; Vertigo [ ] ; Seizures [ ] ; Paresthesias [ ] ;Blurred vision [ ] ; Diplopia [ ] ; Vision changes [ ]     Ortho/Skin: Arthritis [ ] ; Joint pain [ ] ; Muscle pain [ ] ; Joint swelling [ ] ; Back Pain [ ] ; Rash [ ]     Psych: Depression [ ] ; Anxiety [ ]     Heme: Bleeding problems [ ] ; Clotting disorders [ ] ; Anemia [ ]     Endocrine: Diabetes [ ] ; Thyroid dysfunction [ ]    Physical Exam/Data:   Vitals:   06/21/20 0530 06/21/20 0541 06/21/20 0600 06/21/20 0743  BP: (!) 145/103  132/85 (!) 122/97  Pulse: (!) 120  (!) 114 (!) 113  Resp: 18  18 18   Temp:    98.1 F (36.7 C)  TempSrc:    Oral  SpO2: 91% 95% 98% 90%  Weight:      Height:       No intake or output data in the 24 hours ending 06/21/20 0918 Last 3 Weights 06/21/2020 06/17/2020 06/10/2020  Weight (lbs) 211 lb 10.3 oz 212 lb 1.3 oz 212 lb  Weight (kg) 96 kg 96.2 kg 96.163 kg     Body mass index is 32.18 kg/m.  General:  Well nourished, well developed, in no acute distress HEENT: normal Lymph: no adenopathy Neck: no JVD Endocrine:  No thryomegaly Vascular: No carotid bruits; FA pulses 2+ bilaterally without bruits  Cardiac:  normal S1, S2; RRR; 4/6 systolic crescendo murmur throughout precordium, loudest at RUSB Lungs:  clear to auscultation bilaterally, no wheezing, rhonchi or rales  Abd: soft, nontender, no hepatomegaly  Ext: no LE edema Musculoskeletal:  No deformities, BUE and BLE strength normal and equal Skin: warm and dry  Neuro:  CNs 2-12 intact, no focal abnormalities noted Psych:  Normal affect   EKG:  The ECG done  at 03:13 (05/22) with AF (rate controlled HR 95)  Relevant CV Studies:  RHC/LHC/corrs Result date: 06/17/20  Very poor rate control related to patient not taking medications.  Atrial fibrillation  with RVR varying between 101 160 bpm.  Therefore, the patient was treated with IV metoprolol receiving 15 mg and 5 mg doses over 30 minutes before proceeding.  This decrease the heart rate into the 90 to 115 bpm range.  Widely patent coronary arteries.  Vessels are tortuous but have no significant obstruction.  Normal pulmonary artery pressures with mean pulmonary wedge 23 mmHg (influenced by rate).  Variable RR interval made it difficult to appropriately assess left ventricular gradient.  "Eyeball" peak to peak gradient is 20 mmHg.  AV coronary and mitral annular calcification noted on cine fluoroscopy.  Cardiac output by Fick 4.82 L/min with index 1.99 L/min/m.  These findings are consistent with potential low-flow low gradient aortic stenosis as suspected.  Calculated aortic valve area 1.0 cm based upon a mean gradient of 14 mmHg.  TTE Result date: 05/11/20 1. Left ventricular ejection fraction, by estimation, is 55 to 60%. The  left ventricle has normal function. The left ventricle has no regional  wall motion abnormalities. There is mild left ventricular hypertrophy.  Left ventricular diastolic parameters  are indeterminate.  2. Right ventricular systolic function is normal. The right ventricular  size is normal. There is normal pulmonary artery systolic pressure. The  estimated right ventricular systolic pressure is 29.6 mmHg.  3. Left atrial size was moderately dilated.  4. Right atrial size was moderately dilated.  5. The mitral valve is degenerative. Trivial mitral valve regurgitation.  Moderate mitral annular calcification.  6. The aortic valve is tricuspid. Aortic valve regurgitation is trivial.  Possible paradoxical low flow/low gradient severe aortic valve stenosis,  however patient is in atrial fibrillation with RVR so would repeat echo  when rate is better-controlled.  Aortic valve area, by VTI measures 0.73 cm. Aortic valve mean gradient  measures 29.0 mmHg.   7. The inferior vena cava is normal in size with greater than 50%  respiratory variability, suggesting right atrial pressure of 3 mmHg.  8. Atrial fibrillation with RVR.   Laboratory Data:  High Sensitivity Troponin:   Recent Labs  Lab 06/21/20 0002 06/21/20 0427  TROPONINIHS 8 7      Chemistry Recent Labs  Lab 06/17/20 0922 06/21/20 0002 06/21/20 0218  NA 142 138  --   K 3.5 6.0* 3.7  CL  --  107  --   CO2  --  23  --   GLUCOSE  --  152*  --   BUN  --  16  --   CREATININE  --  0.99  --   CALCIUM  --  9.4  --   GFRNONAA  --  >60  --   ANIONGAP  --  8  --     Recent Labs  Lab 06/21/20 0002  PROT 6.9  ALBUMIN 3.9  AST 63*  ALT 42  ALKPHOS 67  BILITOT 1.5*   Hematology Recent Labs  Lab 06/17/20 0922 06/21/20 0002  WBC  --  10.1  RBC  --  3.94  HGB 11.6* 12.5  HCT 34.0* 38.3  MCV  --  97.2  MCH  --  31.7  MCHC  --  32.6  RDW  --  14.1  PLT  --  160   BNPNo results for input(s): BNP, PROBNP in the last 168 hours.  DDimer No  results for input(s): DDIMER in the last 168 hours.  Radiology/Studies:  DG Chest Portable 1 View  Result Date: 06/21/2020 CLINICAL DATA:  Shortness of breath. EXAM: PORTABLE CHEST 1 VIEW COMPARISON:  December 07, 2016 FINDINGS: Mild, chronic appearing increased lung markings are seen without evidence of focal consolidation, pleural effusion or pneumothorax. Very mild peribronchial thickening is noted within the perihilar regions, bilaterally. The cardiac silhouette is mildly enlarged. The visualized skeletal structures are unremarkable. IMPRESSION: Mild bilateral peribronchial thickening without an acute infiltrate. Electronically Signed   By: Aram Candela M.D.   On: 06/21/2020 00:18   CT Angio Chest/Abd/Pel for Dissection W and/or Wo Contrast  Result Date: 06/21/2020 CLINICAL DATA:  Abdominal pain, aortic dissection suspected. weakness and a-fib. heart cath performed Wednesday. EXAM: CT ANGIOGRAPHY CHEST, ABDOMEN AND PELVIS  TECHNIQUE: Non-contrast CT of the chest was initially obtained. Multidetector CT imaging through the chest, abdomen and pelvis was performed using the standard protocol during bolus administration of intravenous contrast. Multiplanar reconstructed images and MIPs were obtained and reviewed to evaluate the vascular anatomy. CONTRAST:  OMNIPAQUE IOHEXOL 350 MG/ML SOLN COMPARISON:  None. FINDINGS: CTA CHEST FINDINGS Cardiovascular: Satisfactory opacification of the pulmonary arteries to the segmental level. No evidence of pulmonary embolism. Satisfactory opacification of the aorta with no aneurysm or dissection. Mild cardiomegaly. Question patent foramen ovale. No pericardial effusion. Four-vessel coronary artery calcifications. Mitral annular calcifications at least moderate. Mediastinum/Nodes: Multiple borderline enlarged mediastinal lymph nodes: 0.9 cm right paratracheal (7:45). Prominent left hilar lymph nodes. Likely enlarged right hilar lymph node: 1.3 cm (7:67). No enlarged axillary lymph nodes. Thyroid gland, trachea, and esophagus demonstrate no significant findings. Lungs/Pleura: Bilateral lower lobe subsegmental atelectasis. Few scattered calcified and noncalcified pulmonary micronodules (6:34, levin). No pulmonary mass. No focal consolidation. No pleural effusion. No pneumothorax. 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 Diffuse bronchial wall thickening. Musculoskeletal: No chest wall abnormality. No suspicious lytic or blastic osseous lesions. No acute displaced fracture. Multilevel degenerative changes of the spine. Degenerative changes of bilateral shoulders. Review of the MIP images confirms the above findings. CTA ABDOMEN AND PELVIS FINDINGS VASCULAR Aorta: At least moderate atherosclerotic plaque. Normal caliber aorta without aneurysm, dissection, vasculitis or significant stenosis. Celiac: At least moderate stenosis of the origin of the celiac  artery due to atherosclerotic plaque. Patent without evidence of aneurysm, dissection, vasculitis or significant stenosis. SMA: Patent without evidence of aneurysm, dissection, vasculitis or significant stenosis. Renals: Atherosclerotic plaque. Both renal arteries are patent without evidence of aneurysm, dissection, vasculitis, fibromuscular dysplasia or significant stenosis. IMA: Patent without evidence of aneurysm, dissection, vasculitis or significant stenosis. Inflow: Patent without evidence of aneurysm, dissection, vasculitis or significant stenosis. Veins: No obvious venous abnormality within the limitations of this arterial phase study. Review of the MIP images confirms the above findings. NON-VASCULAR Hepatobiliary: No focal liver abnormality. No gallstones, gallbladder wall thickening, or pericholecystic fluid. No biliary dilatation. Pancreas: No focal lesion. Normal pancreatic contour. No surrounding inflammatory changes. No main pancreatic ductal dilatation. Spleen: Normal in size without focal abnormality. Adrenals/Urinary Tract: No adrenal nodule bilaterally. Bilateral kidneys enhance symmetrically. No hydronephrosis. No hydroureter. The urinary bladder is unremarkable. Stomach/Bowel: Stomach is within normal limits. No evidence of bowel wall thickening or dilatation. Scattered colonic diverticulosis. Appendix appears normal. Lymphatic: No lymphadenopathy. Reproductive: Uterus and bilateral adnexa are unremarkable. Other: No intraperitoneal free fluid. No intraperitoneal free gas. No organized fluid collection. Musculoskeletal: No abdominal wall hernia or abnormality. No suspicious lytic or blastic  osseous lesions. No acute displaced fracture. Multilevel degenerative changes of the spine. Review of the MIP images confirms the above findings. IMPRESSION: 1. No acute aortic abnormality. Aortic Atherosclerosis (ICD10-I70.0) including four-vessel coronary artery calcifications and moderate mitral annular  calcifications. 2. No central or segmental pulmonary embolus. 3. Nonspecific right hilar lymphadenopathy and prominent left hilar/mediastinal lymph nodes. Findings may be reactive in etiology. Recommend attention on follow-up. 4. 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 Correlation with prior cross-sectional imaging would be of value. Recommend attention on follow-up. 5. Mild cardiomegaly with question of patent foramen ovale. 6. Scattered colonic diverticulosis with no acute diverticulitis. Electronically Signed   By: Tish Frederickson M.D.   On: 06/21/2020 04:21   Assessment and Plan:   1. Paradoxical severe AS Briana Foster presents with vague symptoms of fatigue and generalized weakness however her lab work so far has not revealed a specific etiology for her symptoms.  Although she seemed to experience a significant decline on Saturday the most likely current explanation for her symptoms is a combination of AF/RVR in the setting of severe AS.  She is undergoing preop evaluation for further management of her AS with recent cors/LHC/RHC.  I do not think that she tolerates her AF well especially when she is in RVR.  She has had difficult to manage atrial fibrillation requiring both nodal blockade and AADs. There was concern that her severe AS was driving worsening AF.  She still needs several studies completed before a decision can be made on TAVR/SAVR candidacy including bilateral carotid Dopplers, cardiac CT, CTA chest/abdomen/pelvis, and PT assessment.  She had her coronary evaluation this week.  While we are trying to get her AF better controlled I think it would be worthwhile to go ahead and get some of these studies completed as an inpatient to expedite her evaluation.  Given that she already received a fair amount of contrast this morning for CT angio chest/abdomen/pelvis for dissection protocol we probably should wait a bit to the cardiac CT.  In the meantime she could  have her PT assessment and bilateral carotid Dopplers while here.  2. pAF She has been treated with Cardizem CD 180 mg p.o. twice daily along with metoprolol titrate 100 mg p.o. twice daily and was recently started on amiodarone.  She does not seem to be adequately rate controlled despite high doses of 2 nodal blocking agents.  I think we can continue her metoprolol and Cardizem while loading her with amio IV to hopefully get her rhythm better under control.  Ideally she would be in normal sinus rhythm prior to this obtaining a cardiac CT.  - start amio IV load - continue home metop/dilt - continue eliquis   Severity of Illness: The appropriate patient status for this patient is INPATIENT. Inpatient status is judged to be reasonable and necessary in order to provide the required intensity of service to ensure the patient's safety. The patient's presenting symptoms, physical exam findings, and initial radiographic and laboratory data in the context of their chronic comorbidities is felt to place them at high risk for further clinical deterioration. Furthermore, it is not anticipated that the patient will be medically stable for discharge from the hospital within 2 midnights of admission. The following factors support the patient status of inpatient.   " The patient's presenting symptoms include weakness. " The worrisome physical exam findings include AS. " The initial radiographic and laboratory data are worrisome because of worsening dyspnea. " The  chronic co-morbidities include severe AS, pAF, anemia, HLD, RA, GERD, HTN, and DM2  * I certify that at the point of admission it is my clinical judgment that the patient will require inpatient hospital care spanning beyond 2 midnights from the point of admission due to high intensity of service, high risk for further deterioration and high frequency of surveillance required.*   For questions or updates, please contact [facility] Please consult  [email or web address] for contact info under   Signed, Linton Rump, MD  06/21/2020 9:18 AM   Patient seen and examined, note reviewed with the signed Resident Physician/Advanced Practice Provider. I personally reviewed laboratory data, imaging studies and relevant notes. I independently examined the patient and formulated the important aspects of the plan. I have personally discussed the plan with the patient and/or family. Comments or changes to the note/plan are indicated below.  My Exam:  General: Well nourished, well developed, in no acute distress Head: Atraumatic, normal size  Eyes: PEERLA, EOMI  Neck: Supple, no JVD Endocrine: No thryomegaly Cardiac: Normal S1, S2; irregular rhythm, 2 out of 6 systolic ejection murmur, early peaking, S2 not clearly heard Lungs: Clear to auscultation bilaterally, no wheezing, rhonchi or rales  Abd: Soft, nontender, no hepatomegaly  Ext: No edema, pulses 2+ Musculoskeletal: No deformities, BUE and BLE strength normal and equal Skin: Warm and dry, no rashes   Neuro: Alert and oriented to person, place, time, and situation, CNII-XII grossly intact, no focal deficits  Psych: Normal mood and affect   Telemetry: Atrial fibrillation heart rate in the 70s, pauses noted up to 3 seconds EKG: A. fib heart rate 95, no acute ischemic changes Left heart cath 06/17/2020: Normal coronary arteries  Assessment & Plan:  1.  Permanent A. Fib -Admitted with A. fib with RVR.  Likely exacerbated her symptoms of aortic stenosis. -She has failed rhythm control.  It appears rate control as been recommended. -She was placed briefly on amiodarone with controlled heart rates. -On my review she is not adequately on and off rate control medications. -Would recommend to continue her home diltiazem extended release 180 mg -It is unclear how much metoprolol she is taking at home.  I will start her on 50 mg every 6 hours.  Can transition to succinate as we are able. -We will  stop amiodarone.  This has been stopped in the outpatient setting.  Not a good rate control strategy and permanent A. Fib. -We will continue her Eliquis. -I think she should be seen by EP this admission.  We will have them see her tomorrow.  She may be failing rate control strategy and need to be considered for AV node ablation and pacemaker.  I will defer this to them.  2.  Paradoxical low-flow low gradient aortic stenosis -Her A. fib does exacerbate her symptoms of aortic stenosis -Plan to have her work-up done while she is here.  Structural consult tomorrow.  Signed, Lenna Gilford. Flora Lipps, MD [facility]  [facility]  06/21/2020 4:01 PM

## 2020-06-21 NOTE — Consult Note (Signed)
Consult Note    Briana Foster XBM:[id number] DOB: 1945-08-16 DOA: 06/20/2020  PCP: Georgann Housekeeper, MD Consultants:  [facility] - cardiology Patient coming from:  Home - lives with daughter; NOK: Daughter  Chief Complaint: SOB  HPI: Briana Foster is a 75 y.o. female with medical history significant of DM; RA; RLS; HLD: HTN; afib on Eliquis; obesity; and severe AS being considering for surgical AVR vs. TAVR presenting with fatigue and SOB.  She was seen by [facility] on 5/11 for severe AS.  She also has refractory afib and was in RVR during her echo.  She was planned for right and left heart cath on 5/18 with Dr. Katrinka Blazing.  She did not take her rate controlling medications that morning and had significant RVR on the cath table, treated with IV Lopressor.  Cath results were c/w paradoxical low flow low gradient severe AS.  She presented today with weakness and SOB that are likely due to refractory afib in conjunction with severe AS. She left Wednesday AM without difficulty and slept well that night.  She felt good Thursday but didn't do much.  She felt good Friday and she ran errands with her daughter (stayed in the car).  She sat on the porch and felt ok. She felt tired Friday night and Saturday she "just couldn't get myself in gear... and I stayed that way all day, just couldn't get it together."  She slept periodically through the day.  She felt "swimmy headed".  She did not feel more SOB than usual.    ED Course:  Carryover, per Dr. Antionette Char:  75 yr old female with hx of a fib on Eliquis, DM, HLD, RA, and severe aortic stenosis being considered for surgical AVR or TAVR and now presenting with fatigue and SOB. She was started on diltiazem infusion in ED for rapid a fib and had CTA chest/abd/pelvis negative for PE or acute aortic syndrome. Cardiology fellow is consulting and recommended medical admission.   Review of Systems: As per HPI; otherwise review of systems reviewed and negative.    Ambulatory Status:  Ambulates without assistance  COVID Vaccine Status:  Complete, no booster  Past Medical History:  Diagnosis Date  . Anemia   . Arthritis 07-20-12   hands  . Asthma   . Back pain   . Cardiomegaly   . Class 1 obesity with serious comorbidity and body mass index (BMI) of 31.0 to 31.9 in adult 03/26/2018  . Depression   . Essential hypertension 03/26/2018  . GERD (gastroesophageal reflux disease)    "comes and goes" - no meds currently  . Heart murmur    told as teenager  . Hyperlipidemia   . Joint pain   . Restless leg syndrome   . Rheumatoid arthritis (HCC)   . Shortness of breath   . Tiredness   . Type 2 diabetes mellitus without complication, without long-term current use of insulin (HCC) 04/10/2018  . Vitamin D deficiency     Past Surgical History:  Procedure Laterality Date  . BUNIONECTOMY  20 yrs ago   bil feet  . BUNIONECTOMY  11/30/2011   Procedure: Arbutus Leas;  Surgeon: Drucilla Schmidt, MD;  Location: [facility] ORS;  Service: Orthopedics;  Laterality: Bilateral;  RIGHT FOOT EXCISION OF BUNIONETTE AND PARTIAL   PROXIMAL PHALANGECTOMY OF 5TH TOE LEFT FOOT FUNK BUNIONECTOMY,EXCISION OF BUNIONETTE   . CARDIOVERSION N/A 11/05/2019   Procedure: CARDIOVERSION;  Surgeon: Lewayne Bunting, MD;  Location: [facility] END[facility]COPY;  Service: Cardiovascular;  Laterality: N/A;  . CARDIOVERSION N/A 12/05/2019   Procedure: CARDIOVERSION;  Surgeon: Parke Poisson, MD;  Location: [facility] ENDOSCOPY;  Service: Cardiovascular;  Laterality: N/A;  . COLONOSCOPY WITH PROPOFOL N/A 08/07/2012   Procedure: COLONOSCOPY WITH PROPOFOL;  Surgeon: Charolett Bumpers, MD;  Location: [facility] ENDOSCOPY;  Service: Endoscopy;  Laterality: N/A;  . MOUTH SURGERY     teeth extractions  . RIGHT/LEFT HEART CATH AND CORONARY ANGIOGRAPHY N/A 06/17/2020   Procedure: RIGHT/LEFT HEART CATH AND CORONARY ANGIOGRAPHY;  Surgeon: Lyn Records, MD;  Location: [facility] INVASIVE CV LAB;  Service: Cardiovascular;  Laterality: N/A;   . TONSILLECTOMY    . TUBAL LIGATION    . WRIST SURGERY      Social History   Socioeconomic History  . Marital status: Widowed    Spouse name: Not on file  . Number of children: 2  . Years of education: Not on file  . Highest education level: Not on file  Occupational History  . Occupation: Retired-worked at [employer]  . Smoking status: Never Smoker  . Smokeless tobacco: Never Used  Substance and Sexual Activity  . Alcohol use: No  . Drug use: No  . Sexual activity: Not Currently  Other Topics Concern  . Not on file  Social History Narrative  . Not on file   Social Determinants of Health   Financial Resource Strain: Not on file  Food Insecurity: Not on file  Transportation Needs: Not on file  Physical Activity: Not on file  Stress: Not on file  Social Connections: Not on file  Intimate Partner Violence: Not on file    Allergies  Allergen Reactions  . Bee Venom Shortness Of Breath and Rash  . Apricot Flavor Swelling    Eyes swell shut  . Atorvastatin Other (See Comments)  . Wasp Venom Other (See Comments)    Family History  Problem Relation Age of Onset  . Heart disease Mother   . Stroke Mother   . Cancer Father        Prostate    Prior to Admission medications   Medication Sig Start Date End Date Taking? Authorizing Provider  albuterol (VENTOLIN HFA) 108 (90 Base) MCG/ACT inhaler Inhale 2 puffs into the lungs every 4 (four) hours as needed for wheezing or shortness of breath.   Yes [provider]  amiodarone (PACERONE) 200 MG tablet Take one tablet by mouth twice daily for 3 weeks, then decrease to one tablet by mouth daily. Patient taking differently: Take 200 mg by mouth daily. 06/11/20  Yes Lyn Records, MD  amLODipine (NORVASC) 5 MG tablet Take 5 mg by mouth daily in the afternoon.   Yes [provider]  apixaban (ELIQUIS) 5 MG TABS tablet Take 1 tablet (5 mg total) by mouth 2 (two) times daily. Resume at 8 PM  tonight Patient taking differently: Take 5 mg by mouth 2 (two) times daily. 06/17/20  Yes Lyn Records, MD  cetirizine (ZYRTEC) 10 MG tablet Take 10 mg by mouth daily as needed for allergies.   Yes [provider]  diclofenac Sodium (VOLTAREN) 1 % GEL Apply 1 application topically 2 (two) times daily as needed (pain). 02/21/20  Yes [provider]  diltiazem (CARDIZEM CD) 180 MG 24 hr capsule Take 1 capsule (180 mg total) by mouth in the morning and at bedtime. 06/10/20  Yes Kathleene Hazel, MD  metFORMIN (GLUCOPHAGE-XR) 750 MG 24 hr tablet Take 750 mg by mouth daily in the  afternoon. 01/08/20  Yes [provider]  metoprolol tartrate (LOPRESSOR) 50 MG tablet Take 50 mg by mouth daily.   Yes [provider]  Multiple Vitamins-Minerals (MULTIVITAMIN WITH MINERALS) tablet Take 1 tablet by mouth daily.   Yes [provider]  Naphazoline-Pheniramine (ALLERGY EYE OP) Place 1 drop into both eyes daily as needed (red/itchy eyes).   Yes [provider]  pantoprazole (PROTONIX) 40 MG tablet Take 40 mg by mouth daily as needed (heartburn).   Yes [provider]  rOPINIRole (REQUIP) 4 MG tablet Take 4 mg by mouth at bedtime.   Yes [provider]  rosuvastatin (CRESTOR) 20 MG tablet Take 20 mg by mouth daily in the afternoon.   Yes [provider]  vitamin C (ASCORBIC ACID) 500 MG tablet Take 500 mg by mouth daily.   Yes [provider]    Physical Exam: Vitals:   06/21/20 0530 06/21/20 0541 06/21/20 0600 06/21/20 0743  BP: (!) 145/103  132/85 (!) 122/97  Pulse: (!) 120  (!) 114 (!) 113  Resp: 18  18 18   Temp:    98.1 F (36.7 C)  TempSrc:    Oral  SpO2: 91% 95% 98% 90%  Weight:      Height:         . General:  Appears calm and comfortable and is in NAD, appears fatigued . Eyes:  PERRL, EOMI, normal lids, iris . ENT:  grossly normal hearing, lips & tongue, mmm; artificial dentition . Neck:  no LAD,  masses or thyromegaly . Cardiovascular:  Irregularly irregular, aortic murmur not as prominent as anticipated. No LE edema.  Respiratory:   CTA bilaterally with no wheezes/rales/rhonchi.  Normal respiratory effort on 2L [place] O2. . Abdomen:  soft, NT, ND . Skin:  no rash or induration seen on limited exam . Musculoskeletal:  grossly normal tone BUE/BLE, good ROM, no bony abnormality . Psychiatric:  grossly normal mood and affect, speech fluent and appropriate, AOx3 . Neurologic:  CN 2-12 grossly intact, moves all extremities in coordinated fashion    Radiological Exams on Admission: Independently reviewed - see discussion in A/P where applicable  DG Chest Portable 1 View  Result Date: 06/21/2020 CLINICAL DATA:  Shortness of breath. EXAM: PORTABLE CHEST 1 VIEW COMPARISON:  December 07, 2016 FINDINGS: Mild, chronic appearing increased lung markings are seen without evidence of focal consolidation, pleural effusion or pneumothorax. Very mild peribronchial thickening is noted within the perihilar regions, bilaterally. The cardiac silhouette is mildly enlarged. The visualized skeletal structures are unremarkable. IMPRESSION: Mild bilateral peribronchial thickening without an acute infiltrate. Electronically Signed   By: December 09, 2016 M.D.   On: 06/21/2020 00:18   CT Angio Chest/Abd/Pel for Dissection W and/or Wo Contrast  Result Date: 06/21/2020 CLINICAL DATA:  Abdominal pain, aortic dissection suspected. weakness and a-fib. heart cath performed Wednesday. EXAM: CT ANGIOGRAPHY CHEST, ABDOMEN AND PELVIS TECHNIQUE: Non-contrast CT of the chest was initially obtained. Multidetector CT imaging through the chest, abdomen and pelvis was performed using the standard protocol during bolus administration of intravenous contrast. Multiplanar reconstructed images and MIPs were obtained and reviewed to evaluate the vascular anatomy. CONTRAST:  Sunday OMNIPAQUE IOHEXOL 350 MG/ML SOLN COMPARISON:  None. FINDINGS:  CTA CHEST FINDINGS Cardiovascular: Satisfactory opacification of the pulmonary arteries to the segmental level. No evidence of pulmonary embolism. Satisfactory opacification of the aorta with no aneurysm or dissection. Mild cardiomegaly. Question patent foramen ovale. No pericardial effusion. Four-vessel coronary artery calcifications. Mitral annular calcifications at  least moderate. Mediastinum/Nodes: Multiple borderline enlarged mediastinal lymph nodes: 0.9 cm right paratracheal (7:45). Prominent left hilar lymph nodes. Likely enlarged right hilar lymph node: 1.3 cm (7:67). No enlarged axillary lymph nodes. Thyroid gland, trachea, and esophagus demonstrate no significant findings. Lungs/Pleura: Bilateral lower lobe subsegmental atelectasis. Few scattered calcified and noncalcified pulmonary micronodules (6:34, levin). No pulmonary mass. No focal consolidation. No pleural effusion. No pneumothorax. 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 Diffuse bronchial wall thickening. Musculoskeletal: No chest wall abnormality. No suspicious lytic or blastic osseous lesions. No acute displaced fracture. Multilevel degenerative changes of the spine. Degenerative changes of bilateral shoulders. Review of the MIP images confirms the above findings. CTA ABDOMEN AND PELVIS FINDINGS VASCULAR Aorta: At least moderate atherosclerotic plaque. Normal caliber aorta without aneurysm, dissection, vasculitis or significant stenosis. 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 Patent without evidence of aneurysm, dissection, vasculitis or significant stenosis. SMA: Patent without evidence of aneurysm, dissection, vasculitis or significant stenosis. Renals: Atherosclerotic plaque. Both renal arteries are patent without evidence of aneurysm, dissection, vasculitis, fibromuscular dysplasia or significant stenosis. IMA: Patent without evidence  of aneurysm, dissection, vasculitis or significant stenosis. Inflow: Patent without evidence of aneurysm, dissection, vasculitis or significant stenosis. Veins: No obvious venous abnormality within the limitations of this arterial phase study. Review of the MIP images confirms the above findings. NON-VASCULAR Hepatobiliary: No focal liver abnormality. No gallstones, gallbladder wall thickening, or pericholecystic fluid. No biliary dilatation. Pancreas: No focal lesion. Normal pancreatic contour. No surrounding inflammatory changes. No main pancreatic ductal dilatation. Spleen: Normal in size without focal abnormality. Adrenals/Urinary Tract: No adrenal nodule bilaterally. Bilateral kidneys enhance symmetrically. No hydronephrosis. No hydroureter. The urinary bladder is unremarkable. Stomach/Bowel: Stomach is within normal limits. No evidence of bowel wall thickening or dilatation. Scattered colonic diverticulosis. Appendix appears normal. Lymphatic: No lymphadenopathy. Reproductive: Uterus and bilateral adnexa are unremarkable. Other: No intraperitoneal free fluid. No intraperitoneal free gas. No organized fluid collection. Musculoskeletal: No abdominal wall hernia or abnormality. No suspicious lytic or blastic osseous lesions. No acute displaced fracture. Multilevel degenerative changes of the spine. Review of the MIP images confirms the above findings. IMPRESSION: 1. No acute aortic abnormality. Aortic Atherosclerosis (ICD10-I70.0) including four-vessel coronary artery calcifications and moderate mitral annular calcifications. 2. No central or segmental pulmonary embolus. 3. Nonspecific right hilar lymphadenopathy and prominent left hilar/mediastinal lymph nodes. Findings may be reactive in etiology. Recommend attention on follow-up. 4. 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 Correlation with prior cross-sectional imaging would be of value. Recommend attention on follow-up. 5.  Mild cardiomegaly with question of patent foramen ovale. 6. Scattered colonic diverticulosis with no acute diverticulitis. Electronically Signed   By: Tish Frederickson M.D.   On: 06/21/2020 04:21    EKG: Independently reviewed.  Afib with rate 95; nonspecific ST changes with no evidence of acute ischemia   Labs on Admission: I have personally reviewed the available labs and imaging studies at the time of the admission.  Pertinent labs:   Glucose 152 AST 63/ALT 42/Bili 1.5 HS troponin 8, 7 Normal CBC COVID/flu negative Normal UA   Assessment/Plan Principal Problem:   Atrial fibrillation with RVR (HCC) Active Problems:   Class 1 obesity with serious comorbidity and body mass index (BMI) of 31.0 to 31.9 in adult   Essential hypertension   Type 2 diabetes mellitus without complication, without long-term current use of insulin (HCC)  Aortic valve stenosis   Rheumatoid arthritis (HCC)   Restless leg syndrome   Afib with RVR, complicated by severe AS  -Patient presenting with refractory afib.  -Also with RHC and LHC last week with severe AS and no coronary obstruction -Symptoms appear to be due to combination of these factors and her symptoms are unlikely to improve until she has TAVR or surgical AVR -HS troponin negative x2. -Given medical stability other than current ongoing cardiology issues, TRH will sign off at this time (discussed with cardiology, who agrees). -She is on Amiodarone, Diltiazem, and Lopressor for rate control. -Takes Eliquis for [facility].  HTN -Home meds include Norvasc, Diltiazem, Lopressor  HLD -Continue Crestor  DM -Hold Glucophage -Suggest coverage with moderate-scale SSI for now  RLS -Continue Requip   Note: This patient has been tested and is negative for the novel coronavirus COVID-19. She has been fully vaccinated against COVID-19.    Based on presentation c/w cardiology-related issues and otherwise medical stability, TRH will sign off at this  time.  Please re-consult if additional medical issues arise.   Jonah Blue MD [facility]   How to contact the [facility] Attending or Consulting provider 7A - 7P or covering provider during after hours 7P -7A, for this patient?  1. Check the care team in [facility] and look for a) attending/consulting TR[facility] provider listed and b) the [facility] team listed 2. Log into [email or web address] and use [facility] universal password to access. If you do not have the password, please contact the hospital operator. 3. Locate the [facility] provider you are looking for under [facility] and page to a number that you can be directly reached. 4. If you still have difficulty reaching the provider, please page the [facility] (Director on Call) for the Hospitalists listed on amion for assistance.   06/21/2020, 8:08 AM

## 2020-06-21 NOTE — ED Triage Notes (Signed)
Pt brought in by EMS from home for weakness and a-fib.

## 2020-06-21 NOTE — Consult Note (Incomplete)
Cardiology Consultation:   Patient ID: Briana Foster MRN: [id number]; DOB: 1945-11-30  Admit date: 06/20/2020 Date of Consult: 06/21/2020  Primary Care Provider: Georgann Housekeeper, MD [facility] Cardiologist: Lesleigh Noe, MD  [facility] Electrophysiologist:  Lanier Prude, MD   Patient Profile:   Briana Foster is a 75 y.o. female with pAF, low flow low gradient AS, anemia, HLD, RA, GERD, HTN, and DM2 who presents with weakness.   History of Present Illness:   Briana Foster : Briana Foster is a 75 y.o. female with medical history significant of DM; RA; RLS; HLD: HTN; afib on Eliquis; obesity; and severe AS being considering for surgical AVR vs. TAVR presenting with fatigue and SOB.  She was seen by [facility] on 5/11 for severe AS.  She also has refractory afib and was in RVR during her echo.  She was planned for right and left heart cath on 5/18 with Dr. Katrinka Blazing.  She did not take her rate controlling medications that morning and had significant RVR on the cath table, treated with IV Lopressor.  Cath results were c/w paradoxical low flow low gradient severe AS.  She presented today with weakness and SOB that are likely due to refractory afib in conjunction with severe AS. She left Wednesday AM without difficulty and slept well that night.  She felt good Thursday but didn't do much.  She felt good Friday and she ran errands with her daughter (stayed in the car).  She sat on the porch and felt ok. She felt tired Friday night and Saturday she "just couldn't get myself in gear... and I stayed that way all day, just couldn't get it together."  She slept periodically through the day.  She felt "swimmy headed".  She did not feel more SOB than usual.    ED Course:  Carryover, per Dr. Antionette Char:  75 yr old female with hx of a fib on Eliquis, DM, HLD, RA, and severe aortic stenosis being considered for surgical AVR or TAVR and now presenting with fatigue and SOB. She was started  on diltiazem infusion in ED for rapid a fib and had CTA chest/abd/pelvis negative for PE or acute aortic syndrome. Cardiology fellow is consulting and recommended medical admission.  : Briana Foster is a 75 y.o. female with medical history significant of DM; RA; RLS; HLD: HTN; afib on Eliquis; obesity; and severe AS being considering for surgical AVR vs. TAVR presenting with fatigue and SOB.  She was seen by [facility] on 5/11 for severe AS.  She also has refractory afib and was in RVR during her echo.  She was planned for right and left heart cath on 5/18 with Dr. Katrinka Blazing.  She did not take her rate controlling medications that morning and had significant RVR on the cath table, treated with IV Lopressor.  Cath results were c/w paradoxical low flow low gradient severe AS.  She presented today with weakness and SOB that are likely due to refractory afib in conjunction with severe AS. She left Wednesday AM without difficulty and slept well that night.  She felt good Thursday but didn't do much.  She felt good Friday and she ran errands with her daughter (stayed in the car).  She sat on the porch and felt ok. She felt tired Friday night and Saturday she "just couldn't get myself in gear... and I stayed that way all day, just couldn't get it together."  She slept periodically through the day.  She felt "swimmy headed".  She did not feel more SOB than usual.    ED Course:  Carryover, per Dr. Antionette Char:  75 yr old female with hx of a fib on Eliquis, DM, HLD, RA, and severe aortic stenosis being considered for surgical AVR or TAVR and now presenting with fatigue and SOB. She was started on diltiazem infusion in ED for rapid a fib and had CTA chest/abd/pelvis negative for PE or acute aortic syndrome. Cardiology fellow is consulting and recommended medical admission.    Past Medical History:  Diagnosis Date  . Anemia   . Arthritis 07-20-12   hands  . Asthma   . Back pain   . Cardiomegaly   . Class 1  obesity with serious comorbidity and body mass index (BMI) of 31.0 to 31.9 in adult 03/26/2018  . Depression   . Diabetes (HCC)   . Essential hypertension 03/26/2018  . GERD (gastroesophageal reflux disease)    "comes and goes" - no meds currently  . Heart murmur    told as teenager  . High blood pressure   . History of hypertension 07-20-12   pt stopped BP med several months ago because BP has been normal-MD is aware  . Hyperlipidemia   . Joint pain   . Restless leg syndrome   . Rheumatoid arthritis (HCC)   . Shortness of breath   . Tiredness   . Type 2 diabetes mellitus without complication, without long-term current use of insulin (HCC) 04/10/2018  . Vitamin D deficiency    Past Surgical History:  Procedure Laterality Date  . BUNIONECTOMY  20 yrs ago   bil feet  . BUNIONECTOMY  11/30/2011   Procedure: Arbutus Leas;  Surgeon: Drucilla Schmidt, MD;  Location: [facility] ORS;  Service: Orthopedics;  Laterality: Bilateral;  RIGHT FOOT EXCISION OF BUNIONETTE AND PARTIAL   PROXIMAL PHALANGECTOMY OF 5TH TOE LEFT FOOT FUNK BUNIONECTOMY,EXCISION OF BUNIONETTE   . CARDIOVERSION N/A 11/05/2019   Procedure: CARDIOVERSION;  Surgeon: Lewayne Bunting, MD;  Location: [facility] ENDOSCOPY;  Service: Cardiovascular;  Laterality: N/A;  . CARDIOVERSION N/A 12/05/2019   Procedure: CARDIOVERSION;  Surgeon: Parke Poisson, MD;  Location: [facility] ENDOSCOPY;  Service: Cardiovascular;  Laterality: N/A;  . COLONOSCOPY WITH PROPOFOL N/A 08/07/2012   Procedure: COLONOSCOPY WITH PROPOFOL;  Surgeon: Charolett Bumpers, MD;  Location: [facility] ENDOSCOPY;  Service: Endoscopy;  Laterality: N/A;  . MOUTH SURGERY     teeth extractions  . RIGHT/LEFT HEART CATH AND CORONARY ANGIOGRAPHY N/A 06/17/2020   Procedure: RIGHT/LEFT HEART CATH AND CORONARY ANGIOGRAPHY;  Surgeon: Lyn Records, MD;  Location: [facility] INVASIVE CV LAB;  Service: Cardiovascular;  Laterality: N/A;  . TONSILLECTOMY    . TUBAL LIGATION    . WRIST SURGERY      Home Medications:   Prior to Admission medications   Medication Sig Start Date End Date Taking? Authorizing Provider  albuterol (VENTOLIN HFA) 108 (90 Base) MCG/ACT inhaler Inhale 2 puffs into the lungs every 4 (four) hours as needed for wheezing or shortness of breath.   Yes [provider]  amiodarone (PACERONE) 200 MG tablet Take one tablet by mouth twice daily for 3 weeks, then decrease to one tablet by mouth daily. Patient taking differently: Take 200 mg by mouth daily. 06/11/20  Yes Lyn Records, MD  amLODipine (NORVASC) 5 MG tablet Take 5 mg by mouth daily in the afternoon.   Yes [provider]  apixaban (ELIQUIS) 5 MG TABS tablet Take 1 tablet (5 mg  total) by mouth 2 (two) times daily. Resume at 8 PM tonight Patient taking differently: Take 5 mg by mouth 2 (two) times daily. 06/17/20  Yes Lyn Records, MD  cetirizine (ZYRTEC) 10 MG tablet Take 10 mg by mouth daily as needed for allergies.   Yes [provider]  diclofenac Sodium (VOLTAREN) 1 % GEL Apply 1 application topically 2 (two) times daily as needed (pain). 02/21/20  Yes [provider]  diltiazem (CARDIZEM CD) 180 MG 24 hr capsule Take 1 capsule (180 mg total) by mouth in the morning and at bedtime. 06/10/20  Yes Kathleene Hazel, MD  metFORMIN (GLUCOPHAGE-XR) 750 MG 24 hr tablet Take 750 mg by mouth daily in the afternoon. 01/08/20  Yes [provider]  metoprolol tartrate (LOPRESSOR) 50 MG tablet Take 50 mg by mouth daily.   Yes [provider]  Multiple Vitamins-Minerals (MULTIVITAMIN WITH MINERALS) tablet Take 1 tablet by mouth daily.   Yes [provider]  Naphazoline-Pheniramine (ALLERGY EYE OP) Place 1 drop into both eyes daily as needed (red/itchy eyes).   Yes [provider]  pantoprazole (PROTONIX) 40 MG tablet Take 40 mg by mouth daily as needed (heartburn).   Yes [provider]  rOPINIRole (REQUIP) 4 MG tablet Take 4 mg by mouth at bedtime.   Yes  [provider]  rosuvastatin (CRESTOR) 20 MG tablet Take 20 mg by mouth daily in the afternoon.   Yes [provider]  vitamin C (ASCORBIC ACID) 500 MG tablet Take 500 mg by mouth daily.   Yes [provider]    Inpatient Medications: Scheduled Meds:  Continuous Infusions:  PRN Meds:   Allergies:    Allergies  Allergen Reactions  . Bee Venom Shortness Of Breath and Rash  . Apricot Flavor Swelling    Eyes swell shut  . Atorvastatin Other (See Comments)  . Wasp Venom Other (See Comments)    Social History:   Social History   Socioeconomic History  . Marital status: Widowed    Spouse name: Not on file  . Number of children: 2  . Years of education: Not on file  . Highest education level: Not on file  Occupational History  . Occupation: Retired-worked at [employer]  . Smoking status: Never Smoker  . Smokeless tobacco: Never Used  Substance and Sexual Activity  . Alcohol use: No  . Drug use: No  . Sexual activity: Not Currently  Other Topics Concern  . Not on file  Social History Narrative  . Not on file   Social Determinants of Health   Financial Resource Strain: Not on file  Food Insecurity: Not on file  Transportation Needs: Not on file  Physical Activity: Not on file  Stress: Not on file  Social Connections: Not on file  Intimate Partner Violence: Not on file    Family History:   Family History  Problem Relation Age of Onset  . Heart disease Mother   . Stroke Mother   . Cancer Father        Prostate    ROS:  Review of Systems: [y] = yes, [ ]  = no       General: Weight gain [ ] ; Weight loss [ ] ; Anorexia [ ] ; Fatigue [ ] ; Fever [ ] ; Chills [ ] ; Weakness [y]    Cardiac: Chest pain/pressure [ ] ; Resting SOB [ ] ; Exertional SOB [ ] ; Orthopnea [ ] ; Pedal Edema [ ] ; Palpitations [ ] ; Syncope [ ] ;  Presyncope [ ] ; Paroxysmal nocturnal dyspnea [ ]     Pulmonary: Cough [ ] ; Wheezing [ ] ; Hemoptysis [ ] ; Sputum [ ] ;  Snoring [ ]     GI: Vomiting [ ] ; Dysphagia [ ] ; Melena [ ] ; Hematochezia [ ] ; Heartburn [ ] ; Abdominal pain [ ] ; Constipation [ ] ; Diarrhea [ ] ; BRBPR [ ]     GU: Hematuria [ ] ; Dysuria [ ] ; Nocturia [ ]   Vascular: Pain in legs with walking [ ] ; Pain in feet with lying flat [ ] ; Non-healing sores [ ] ; Stroke [ ] ; TIA [ ] ; Slurred speech [ ] ;    Neuro: Headaches [ ] ; Vertigo [ ] ; Seizures [ ] ; Paresthesias [ ] ;Blurred vision [ ] ; Diplopia [ ] ; Vision changes [ ]     Ortho/Skin: Arthritis [ ] ; Joint pain [ ] ; Muscle pain [ ] ; Joint swelling [ ] ; Back Pain [ ] ; Rash [ ]     Psych: Depression [ ] ; Anxiety [ ]     Heme: Bleeding problems [ ] ; Clotting disorders [ ] ; Anemia [ ]     Endocrine: Diabetes [ ] ; Thyroid dysfunction [ ]    Physical Exam/Data:   Vitals:   06/21/20 0245 06/21/20 0400 06/21/20 0407 06/21/20 0415  BP: (!) 151/99  (!) 131/107 (!) 143/93  Pulse: 98 (!) 57  (!) 118  Resp: 20 20  (!) 21  Temp:      TempSrc:      SpO2: 90% 91%  90%  Weight:      Height:       No intake or output data in the 24 hours ending 06/21/20 0521 Last 3 Weights 06/21/2020 06/17/2020 06/10/2020  Weight (lbs) 211 lb 10.3 oz 212 lb 1.3 oz 212 lb  Weight (kg) 96 kg 96.2 kg 96.163 kg     Body mass index is 32.18 kg/m.  General:  Well nourished, well developed, in no acute distress*** HEENT: normal Lymph: no adenopathy Neck: no JVD Endocrine:  No thryomegaly Vascular: No carotid bruits; FA pulses 2+ bilaterally without bruits  Cardiac:  normal S1, S2; RRR; no murmur *** Lungs:  clear to auscultation bilaterally, no wheezing, rhonchi or rales  Abd: soft, nontender, no hepatomegaly  Ext: no edema Musculoskeletal:  No deformities, BUE and BLE strength normal and equal Skin: warm and dry  Neuro:  CNs 2-12 intact, no focal abnormalities noted Psych:  Normal affect   EKG:  The EKG was personally reviewed and demonstrates:  *** Telemetry:  Telemetry was personally reviewed and demonstrates:   ***  Relevant CV Studies: ***  Laboratory Data:  High Sensitivity Troponin:   Recent Labs  Lab 06/21/20 0002  TROPONINIHS 8     Chemistry Recent Labs  Lab 06/17/20 0919 06/17/20 0922 06/21/20 0002 06/21/20 0218  NA 143 142 138  --   K 3.4* 3.5 6.0* 3.7  CL  --   --  107  --   CO2  --   --  23  --   GLUCOSE  --   --  152*  --   BUN  --   --  16  --   CREATININE  --   --  0.99  --   CALCIUM  --   --  9.4  --   GFRNONAA  --   --  >60  --   ANIONGAP  --   --  8  --     Recent Labs  Lab 06/21/20 0002  PROT 6.9  ALBUMIN 3.9  AST 63*  ALT 42  ALKPHOS 67  BILITOT 1.5*   Hematology Recent Labs  Lab 06/17/20 0919 06/17/20 0922 06/21/20 0002  WBC  --   --  10.1  RBC  --   --  3.94  HGB 11.9* 11.6* 12.5  HCT 35.0* 34.0* 38.3  MCV  --   --  97.2  MCH  --   --  31.7  MCHC  --   --  32.6  RDW  --   --  14.1  PLT  --   --  160   BNPNo results for input(s): BNP, PROBNP in the last 168 hours.  DDimer No results for input(s): DDIMER in the last 168 hours.  Radiology/Studies:  CARDIAC CATHETERIZATION  Result Date: 06/17/2020  Very poor rate control related to patient not taking medications.  Atrial fibrillation with RVR varying between 101 160 bpm.  Therefore, the patient was treated with IV metoprolol receiving 15 mg and 5 mg doses over 30 minutes before proceeding.  This decrease the heart rate into the 90 to 115 bpm range.  Widely patent coronary arteries.  Vessels are tortuous but have no significant obstruction.  Normal pulmonary artery pressures with mean pulmonary wedge 23 mmHg (influenced by rate).  Variable RR interval made it difficult to appropriately assess left ventricular gradient.  "Eyeball" peak to peak gradient is 20 mmHg.  AV coronary and mitral annular calcification noted on cine fluoroscopy.  Cardiac output by Fick 4.82 L/min with index 1.99 L/min/m.  These findings are consistent with potential low-flow low gradient aortic stenosis as suspected.   Calculated aortic valve area 1.0 cm based upon a mean gradient of 14 mmHg. RECOMMENDATIONS:  Back to Dr. Clifton James for CT imaging and referral to cardiac surgery for second opinion concerning management with TAVR versus SAVR.  DG Chest Portable 1 View  Result Date: 06/21/2020 CLINICAL DATA:  Shortness of breath. EXAM: PORTABLE CHEST 1 VIEW COMPARISON:  December 07, 2016 FINDINGS: Mild, chronic appearing increased lung markings are seen without evidence of focal consolidation, pleural effusion or pneumothorax. Very mild peribronchial thickening is noted within the perihilar regions, bilaterally. The cardiac silhouette is mildly enlarged. The visualized skeletal structures are unremarkable. IMPRESSION: Mild bilateral peribronchial thickening without an acute infiltrate. Electronically Signed   By: Aram Candela M.D.   On: 06/21/2020 00:18   CT Angio Chest/Abd/Pel for Dissection W and/or Wo Contrast  Result Date: 06/21/2020 CLINICAL DATA:  Abdominal pain, aortic dissection suspected. weakness and a-fib. heart cath performed Wednesday. EXAM: CT ANGIOGRAPHY CHEST, ABDOMEN AND PELVIS TECHNIQUE: Non-contrast CT of the chest was initially obtained. Multidetector CT imaging through the chest, abdomen and pelvis was performed using the standard protocol during bolus administration of intravenous contrast. Multiplanar reconstructed images and MIPs were obtained and reviewed to evaluate the vascular anatomy. CONTRAST:  OMNIPAQUE IOHEXOL 350 MG/ML SOLN COMPARISON:  None. FINDINGS: CTA CHEST FINDINGS Cardiovascular: Satisfactory opacification of the pulmonary arteries to the segmental level. No evidence of pulmonary embolism. Satisfactory opacification of the aorta with no aneurysm or dissection. Mild cardiomegaly. Question patent foramen ovale. No pericardial effusion. Four-vessel coronary artery calcifications. Mitral annular calcifications at least moderate. Mediastinum/Nodes: Multiple borderline enlarged  mediastinal lymph nodes: 0.9 cm right paratracheal (7:45). Prominent left hilar lymph nodes. Likely enlarged right hilar lymph node: 1.3 cm (7:67). No enlarged axillary lymph nodes. Thyroid gland, trachea, and esophagus demonstrate no significant findings. Lungs/Pleura: Bilateral lower lobe subsegmental atelectasis. Few scattered calcified and noncalcified pulmonary micronodules (6:34, levin). No pulmonary mass. No focal  consolidation. No pleural effusion. No pneumothorax. 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 Diffuse bronchial wall thickening. Musculoskeletal: No chest wall abnormality. No suspicious lytic or blastic osseous lesions. No acute displaced fracture. Multilevel degenerative changes of the spine. Degenerative changes of bilateral shoulders. Review of the MIP images confirms the above findings. CTA ABDOMEN AND PELVIS FINDINGS VASCULAR Aorta: At least moderate atherosclerotic plaque. Normal caliber aorta without aneurysm, dissection, vasculitis or significant stenosis. 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 Patent without evidence of aneurysm, dissection, vasculitis or significant stenosis. SMA: Patent without evidence of aneurysm, dissection, vasculitis or significant stenosis. Renals: Atherosclerotic plaque. Both renal arteries are patent without evidence of aneurysm, dissection, vasculitis, fibromuscular dysplasia or significant stenosis. IMA: Patent without evidence of aneurysm, dissection, vasculitis or significant stenosis. Inflow: Patent without evidence of aneurysm, dissection, vasculitis or significant stenosis. Veins: No obvious venous abnormality within the limitations of this arterial phase study. Review of the MIP images confirms the above findings. NON-VASCULAR Hepatobiliary: No focal liver abnormality. No gallstones, gallbladder wall thickening, or pericholecystic fluid. No biliary  dilatation. Pancreas: No focal lesion. Normal pancreatic contour. No surrounding inflammatory changes. No main pancreatic ductal dilatation. Spleen: Normal in size without focal abnormality. Adrenals/Urinary Tract: No adrenal nodule bilaterally. Bilateral kidneys enhance symmetrically. No hydronephrosis. No hydroureter. The urinary bladder is unremarkable. Stomach/Bowel: Stomach is within normal limits. No evidence of bowel wall thickening or dilatation. Scattered colonic diverticulosis. Appendix appears normal. Lymphatic: No lymphadenopathy. Reproductive: Uterus and bilateral adnexa are unremarkable. Other: No intraperitoneal free fluid. No intraperitoneal free gas. No organized fluid collection. Musculoskeletal: No abdominal wall hernia or abnormality. No suspicious lytic or blastic osseous lesions. No acute displaced fracture. Multilevel degenerative changes of the spine. Review of the MIP images confirms the above findings. IMPRESSION: 1. No acute aortic abnormality. Aortic Atherosclerosis (ICD10-I70.0) including four-vessel coronary artery calcifications and moderate mitral annular calcifications. 2. No central or segmental pulmonary embolus. 3. Nonspecific right hilar lymphadenopathy and prominent left hilar/mediastinal lymph nodes. Findings may be reactive in etiology. Recommend attention on follow-up. 4. 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 Correlation with prior cross-sectional imaging would be of value. Recommend attention on follow-up. 5. Mild cardiomegaly with question of patent foramen ovale. 6. Scattered colonic diverticulosis with no acute diverticulitis. Electronically Signed   By: Tish Frederickson M.D.   On: 06/21/2020 04:21   { If the patient is being seen for chest pain, [place], NSTEMI or STEMI Press F2 to calculate a risk score         :[id number]}   {Chest Pain/ACS Risk Score        :[id number]}   Assessment and Plan:   1. ***  {Are we signing off  today?:[id number]}  For questions or updates, please contact [facility] Please consult [email or web address] for contact info under    Signed, Linton Rump, MD  06/21/2020 5:21 AM

## 2020-06-21 NOTE — Plan of Care (Signed)
  Problem: Education: Goal: Knowledge of disease or condition will improve Outcome: Progressing Goal: Understanding of medication regimen will improve Outcome: Progressing Goal: Individualized Educational Video(s) Outcome: Progressing   

## 2020-06-21 NOTE — ED Provider Notes (Addendum)
[facility] EMERGENCY DEPARTMENT Provider Note   CSN: [id number] Arrival date & time: 06/20/20  2358     History Chief Complaint  Patient presents with  . Weakness    Briana Foster is a 75 y.o. female.  The history is provided by the patient.  Weakness Severity:  Severe Onset quality:  Gradual Timing:  Constant Progression:  Worsening Chronicity:  New Context: not recent infection and not stress   Relieved by:  Nothing Worsened by:  Nothing Ineffective treatments:  None tried Associated symptoms: shortness of breath   Associated symptoms: no abdominal pain, no anorexia, no aphasia, no arthralgias, no ataxia, no chest pain, no diarrhea, no dizziness, no dysphagia, no dysuria, no numbness in extremities, no falls, no fever, no foul-smelling urine, no loss of consciousness, no near-syncope, no seizures, no stroke symptoms, no vision change and no vomiting   Risk factors: coronary artery disease   Patient with recent heart catheterization presents with global weakness and fatigue as well as SOB and recurrent AFIB.      Past Medical History:  Diagnosis Date  . Anemia   . Arthritis 07-20-12   hands  . Asthma   . Back pain   . Cardiomegaly   . Class 1 obesity with serious comorbidity and body mass index (BMI) of 31.0 to 31.9 in adult 03/26/2018  . Depression   . Diabetes (HCC)   . Essential hypertension 03/26/2018  . GERD (gastroesophageal reflux disease)    "comes and goes" - no meds currently  . Heart murmur    told as teenager  . High blood pressure   . History of hypertension 07-20-12   pt stopped BP med several months ago because BP has been normal-MD is aware  . Hyperlipidemia   . Joint pain   . Restless leg syndrome   . Rheumatoid arthritis (HCC)   . Shortness of breath   . Tiredness   . Type 2 diabetes mellitus without complication, without long-term current use of insulin (HCC) 04/10/2018  . Vitamin D deficiency     Patient Active  Problem List   Diagnosis Date Noted  . Aortic valve stenosis 01/02/2020  . Persistent atrial fibrillation (HCC)   . Vitamin D deficiency 04/10/2018  . Type 2 diabetes mellitus without complication, without long-term current use of insulin (HCC) 04/10/2018  . Class 1 obesity with serious comorbidity and body mass index (BMI) of 31.0 to 31.9 in adult 03/26/2018  . Essential hypertension 03/26/2018    Past Surgical History:  Procedure Laterality Date  . BUNIONECTOMY  20 yrs ago   bil feet  . BUNIONECTOMY  11/30/2011   Procedure: Arbutus Leas;  Surgeon: Drucilla Schmidt, MD;  Location: [facility] ORS;  Service: Orthopedics;  Laterality: Bilateral;  RIGHT FOOT EXCISION OF BUNIONETTE AND PARTIAL   PROXIMAL PHALANGECTOMY OF 5TH TOE LEFT FOOT FUNK BUNIONECTOMY,EXCISION OF BUNIONETTE   . CARDIOVERSION N/A 11/05/2019   Procedure: CARDIOVERSION;  Surgeon: Lewayne Bunting, MD;  Location: [facility] ENDOSCOPY;  Service: Cardiovascular;  Laterality: N/A;  . CARDIOVERSION N/A 12/05/2019   Procedure: CARDIOVERSION;  Surgeon: Parke Poisson, MD;  Location: [facility] ENDOSCOPY;  Service: Cardiovascular;  Laterality: N/A;  . COLONOSCOPY WITH PROPOFOL N/A 08/07/2012   Procedure: COLONOSCOPY WITH PROPOFOL;  Surgeon: Charolett Bumpers, MD;  Location: [facility] ENDOSCOPY;  Service: Endoscopy;  Laterality: N/A;  . MOUTH SURGERY     teeth extractions  . RIGHT/LEFT HEART CATH AND CORONARY ANGIOGRAPHY N/A 06/17/2020   Procedure: RIGHT/LEFT HEART CATH  AND CORONARY ANGIOGRAPHY;  Surgeon: Lyn Records, MD;  Location: [facility] INVASIVE CV LAB;  Service: Cardiovascular;  Laterality: N/A;  . TONSILLECTOMY    . TUBAL LIGATION    . WRIST SURGERY       OB History    Gravida  2   Para  2   Term      Preterm      AB      Living        SAB      IAB      Ectopic      Multiple      Live Births              Family History  Problem Relation Age of Onset  . Heart disease Mother   . Stroke Mother   . Cancer Father         Prostate    Social History   Tobacco Use  . Smoking status: Never Smoker  . Smokeless tobacco: Never Used  Substance Use Topics  . Alcohol use: No  . Drug use: No    Home Medications Prior to Admission medications   Medication Sig Start Date End Date Taking? Authorizing Provider  albuterol (VENTOLIN HFA) 108 (90 Base) MCG/ACT inhaler Inhale 2 puffs into the lungs every 4 (four) hours as needed for wheezing or shortness of breath.   Yes [provider]  amiodarone (PACERONE) 200 MG tablet Take one tablet by mouth twice daily for 3 weeks, then decrease to one tablet by mouth daily. Patient taking differently: Take 200 mg by mouth daily. 06/11/20  Yes Lyn Records, MD  amLODipine (NORVASC) 5 MG tablet Take 5 mg by mouth daily in the afternoon.   Yes [provider]  apixaban (ELIQUIS) 5 MG TABS tablet Take 1 tablet (5 mg total) by mouth 2 (two) times daily. Resume at 8 PM tonight Patient taking differently: Take 5 mg by mouth 2 (two) times daily. 06/17/20  Yes Lyn Records, MD  cetirizine (ZYRTEC) 10 MG tablet Take 10 mg by mouth daily as needed for allergies.   Yes [provider]  diclofenac Sodium (VOLTAREN) 1 % GEL Apply 1 application topically 2 (two) times daily as needed (pain). 02/21/20  Yes [provider]  diltiazem (CARDIZEM CD) 180 MG 24 hr capsule Take 1 capsule (180 mg total) by mouth in the morning and at bedtime. 06/10/20  Yes Kathleene Hazel, MD  metFORMIN (GLUCOPHAGE-XR) 750 MG 24 hr tablet Take 750 mg by mouth daily in the afternoon. 01/08/20  Yes [provider]  metoprolol tartrate (LOPRESSOR) 50 MG tablet Take 50 mg by mouth daily.   Yes [provider]  Multiple Vitamins-Minerals (MULTIVITAMIN WITH MINERALS) tablet Take 1 tablet by mouth daily.   Yes [provider]  Naphazoline-Pheniramine (ALLERGY EYE OP) Place 1 drop into both eyes daily as needed (red/itchy eyes).   Yes [provider]   pantoprazole (PROTONIX) 40 MG tablet Take 40 mg by mouth daily as needed (heartburn).   Yes [provider]  rOPINIRole (REQUIP) 4 MG tablet Take 4 mg by mouth at bedtime.   Yes [provider]  rosuvastatin (CRESTOR) 20 MG tablet Take 20 mg by mouth daily in the afternoon.   Yes [provider]  vitamin C (ASCORBIC ACID) 500 MG tablet Take 500 mg by mouth daily.   Yes [provider]    Allergies    Bee venom, Apricot flavor, Atorvastatin, and  Wasp venom  Review of Systems   Review of Systems  Constitutional: Negative for fever.  HENT: Negative for congestion.   Eyes: Negative for visual disturbance.  Respiratory: Positive for shortness of breath.   Cardiovascular: Positive for palpitations. Negative for chest pain and near-syncope.  Gastrointestinal: Negative for abdominal pain, anorexia, diarrhea, dysphagia and vomiting.  Genitourinary: Negative for dysuria.  Musculoskeletal: Negative for arthralgias and falls.  Skin: Negative for rash.  Neurological: Positive for weakness. Negative for dizziness, seizures and loss of consciousness.  Psychiatric/Behavioral: Negative for agitation.  All other systems reviewed and are negative.   Physical Exam Updated Vital Signs BP (!) 131/107   Pulse (!) 57   Temp 98.1 F (36.7 C) (Oral)   Resp 20   Ht  (1.727 m)   Wt 96 kg   SpO2 91%   BMI 32.18 kg/m   Physical Exam Vitals and nursing note reviewed.  Constitutional:      General: She is not in acute distress.    Appearance: Normal appearance.  HENT:     Head: Normocephalic and atraumatic.     Nose: Nose normal.  Eyes:     Conjunctiva/sclera: Conjunctivae normal.     Pupils: Pupils are equal, round, and reactive to light.  Cardiovascular:     Rate and Rhythm: Tachycardia present. Rhythm irregular.     Pulses: Normal pulses.     Heart sounds: Normal heart sounds.  Pulmonary:     Breath sounds: Decreased breath sounds present. No rales.   Abdominal:     General: Abdomen is flat. Bowel sounds are normal.     Palpations: Abdomen is soft.     Tenderness: There is no abdominal tenderness. There is no guarding.  Musculoskeletal:        General: Normal range of motion.     Cervical back: Normal range of motion and neck supple.  Skin:    General: Skin is warm and dry.     Capillary Refill: Capillary refill takes less than 2 seconds.     Coloration: Skin is not pale.  Neurological:     General: No focal deficit present.     Mental Status: She is alert and oriented to person, place, and time.     Deep Tendon Reflexes: Reflexes normal.  Psychiatric:        Mood and Affect: Mood normal.        Behavior: Behavior normal.     ED Results / Procedures / Treatments   Labs (all labs ordered are listed, but only abnormal results are displayed) Results for orders placed or performed during the hospital encounter of 06/20/20  Resp Panel by RT-PCR (Flu A&B, Covid) Nasopharyngeal Swab   Specimen: Nasopharyngeal Swab; Nasopharyngeal(NP) swabs in vial transport medium  Result Value Ref Range   SARS Coronavirus 2 by RT PCR NEGATIVE NEGATIVE   Influenza A by PCR NEGATIVE NEGATIVE   Influenza B by PCR NEGATIVE NEGATIVE  CBC with Differential/Platelet  Result Value Ref Range   WBC 10.1 4.0 - 10.5 K/uL   RBC 3.94 3.87 - 5.11 MIL/uL   Hemoglobin 12.5 12.0 - 15.0 g/dL   HCT 16.1 09.6 - 04.5 %   MCV 97.2 80.0 - 100.0 fL   MCH 31.7 26.0 - 34.0 pg   MCHC 32.6 30.0 - 36.0 g/dL   RDW 40.9 81.1 - 91.4 %   Platelets 160 150 - 400 K/uL   nRBC 0.0 0.0 - 0.2 %   Neutrophils Relative % 61 %  Neutro Abs 6.3 1.7 - 7.7 K/uL   Lymphocytes Relative 24 %   Lymphs Abs 2.4 0.7 - 4.0 K/uL   Monocytes Relative 11 %   Monocytes Absolute 1.1 (H) 0.1 - 1.0 K/uL   Eosinophils Relative 2 %   Eosinophils Absolute 0.2 0.0 - 0.5 K/uL   Basophils Relative 1 %   Basophils Absolute 0.1 0.0 - 0.1 K/uL   Immature Granulocytes 1 %   Abs Immature Granulocytes  0.05 0.00 - 0.07 K/uL  Comprehensive metabolic panel  Result Value Ref Range   Sodium 138 135 - 145 mmol/L   Potassium 6.0 (H) 3.5 - 5.1 mmol/L   Chloride 107 98 - 111 mmol/L   CO2 23 22 - 32 mmol/L   Glucose, Bld 152 (H) 70 - 99 mg/dL   BUN 16 8 - 23 mg/dL   Creatinine, Ser 4.65 0.44 - 1.00 mg/dL   Calcium 9.4 8.9 - 68.1 mg/dL   Total Protein 6.9 6.5 - 8.1 g/dL   Albumin 3.9 3.5 - 5.0 g/dL   AST 63 (H) 15 - 41 U/L   ALT 42 0 - 44 U/L   Alkaline Phosphatase 67 38 - 126 U/L   Total Bilirubin 1.5 (H) 0.3 - 1.2 mg/dL   GFR, Estimated >27 >51 mL/min   Anion gap 8 5 - 15  Potassium  Result Value Ref Range   Potassium 3.7 3.5 - 5.1 mmol/L  Troponin I (High Sensitivity)  Result Value Ref Range   Troponin I (High Sensitivity) 8 <18 ng/L   CARDIAC CATHETERIZATION  Result Date: 06/17/2020  Very poor rate control related to patient not taking medications.  Atrial fibrillation with RVR varying between 101 160 bpm.  Therefore, the patient was treated with IV metoprolol receiving 15 mg and 5 mg doses over 30 minutes before proceeding.  This decrease the heart rate into the 90 to 115 bpm range.  Widely patent coronary arteries.  Vessels are tortuous but have no significant obstruction.  Normal pulmonary artery pressures with mean pulmonary wedge 23 mmHg (influenced by rate).  Variable RR interval made it difficult to appropriately assess left ventricular gradient.  "Eyeball" peak to peak gradient is 20 mmHg.  AV coronary and mitral annular calcification noted on cine fluoroscopy.  Cardiac output by Fick 4.82 L/min with index 1.99 L/min/m.  These findings are consistent with potential low-flow low gradient aortic stenosis as suspected.  Calculated aortic valve area 1.0 cm based upon a mean gradient of 14 mmHg. RECOMMENDATIONS:  Back to Dr. Clifton James for CT imaging and referral to cardiac surgery for second opinion concerning management with TAVR versus SAVR.  DG Chest Portable 1 View  Result  Date: 06/21/2020 CLINICAL DATA:  Shortness of breath. EXAM: PORTABLE CHEST 1 VIEW COMPARISON:  December 07, 2016 FINDINGS: Mild, chronic appearing increased lung markings are seen without evidence of focal consolidation, pleural effusion or pneumothorax. Very mild peribronchial thickening is noted within the perihilar regions, bilaterally. The cardiac silhouette is mildly enlarged. The visualized skeletal structures are unremarkable. IMPRESSION: Mild bilateral peribronchial thickening without an acute infiltrate. Electronically Signed   By: Aram Candela M.D.   On: 06/21/2020 00:18    EKG EKG Interpretation  Date/Time:  Sunday Jun 21 2020 00:03:13 EDT Ventricular Rate:  95 PR Interval:    QRS Duration: 86 QT Interval:  386 QTC Calculation: 486 R Axis:   -16 Text Interpretation: Atrial fibrillation Borderline left axis deviation Confirmed by Nazaria Riesen ([id number]) on 06/21/2020 12:10:45 AM  Radiology DG Chest Portable 1 View  Result Date: 06/21/2020 CLINICAL DATA:  Shortness of breath. EXAM: PORTABLE CHEST 1 VIEW COMPARISON:  December 07, 2016 FINDINGS: Mild, chronic appearing increased lung markings are seen without evidence of focal consolidation, pleural effusion or pneumothorax. Very mild peribronchial thickening is noted within the perihilar regions, bilaterally. The cardiac silhouette is mildly enlarged. The visualized skeletal structures are unremarkable. IMPRESSION: Mild bilateral peribronchial thickening without an acute infiltrate. Electronically Signed   By: Aram Candela M.D.   On: 06/21/2020 00:18    Procedures Procedures   Medications Ordered in ED Medications  diltiazem (CARDIZEM) 1 mg/mL load via infusion 10 mg (has no administration in time range)    And  diltiazem (CARDIZEM) 125 mg in dextrose 5% 125 mL (1 mg/mL) infusion (has no administration in time range)  albuterol (PROVENTIL) (2.5 MG/3ML) 0.083% nebulizer solution 5 mg (has no administration in time range)   iohexol (OMNIPAQUE) 350 MG/ML injection 100 mL (100 mLs Intravenous Contrast Given 06/21/20 0332)  ipratropium-albuterol (DUONEB) 0.5-2.5 (3) MG/3ML nebulizer solution 3 mL (3 mLs Nebulization Given 06/21/20 0539)    ED Course  I have reviewed the triage vital signs and the nursing notes.  Pertinent labs & imaging results that were available during my care of the patient were reviewed by me and considered in my medical decision making (see chart for details).    MDM Reviewed: previous chart, nursing note and vitals Reviewed previous: labs Interpretation: labs, ECG, x-ray and CT scan (NACPD on CXR by me, normal troponin, no dissection seen on CT) Total time providing critical care: 30-74 minutes (diltiazem drip ). This excludes time spent performing separately reportable procedures and services. Consults: admitting MD  CRITICAL CARE Performed by: Kandace Elrod K Brinly Maietta-Rasch Total critical care time: 60 minutes Critical care time was exclusive of separately billable procedures and treating other patients. Critical care was necessary to treat or prevent imminent or life-threatening deterioration. Critical care was time spent personally by me on the following activities: development of treatment plan with patient and/or surrogate as well as nursing, discussions with consultants, evaluation of patient's response to treatment, examination of patient, obtaining history from patient or surrogate, ordering and performing treatments and interventions, ordering and review of laboratory studies, ordering and review of radiographic studies, pulse oximetry and re-evaluation of patient's condition.  Final Clinical Impression(s) / ED Diagnoses Seen by cardiology who agree with plan.  They believe the patient has severe AS and this may be contributing.    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  Both verbalize understanding and agree to follow up.     Admit to medicine for ongoing hypoxia and AFIB with RVR          Ludie Pavlik, MD 06/21/20 [phone]

## 2020-06-22 ENCOUNTER — Inpatient Hospital Stay (HOSPITAL_COMMUNITY): Payer: Medicare Other

## 2020-06-22 DIAGNOSIS — I350 Nonrheumatic aortic (valve) stenosis: Secondary | ICD-10-CM

## 2020-06-22 DIAGNOSIS — I10 Essential (primary) hypertension: Secondary | ICD-10-CM

## 2020-06-22 DIAGNOSIS — E119 Type 2 diabetes mellitus without complications: Secondary | ICD-10-CM

## 2020-06-22 LAB — BASIC METABOLIC PANEL
Anion gap: 6 (ref 5–15)
BUN: 16 mg/dL (ref 8–23)
CO2: 25 mmol/L (ref 22–32)
Calcium: 9.2 mg/dL (ref 8.9–10.3)
Chloride: 106 mmol/L (ref 98–111)
Creatinine, Ser: 0.99 mg/dL (ref 0.44–1.00)
GFR, Estimated: >60 mL/min (ref >60)
Glucose, Bld: 154 mg/dL — ABNORMAL HIGH (ref 70–99)
Potassium: 4 mmol/L (ref 3.5–5.1)
Sodium: 137 mmol/L (ref 135–145)

## 2020-06-22 LAB — CBC
HCT: 35.1 % — ABNORMAL LOW (ref 36.0–46.0)
Hemoglobin: 11.3 g/dL — ABNORMAL LOW (ref 12.0–15.0)
MCH: 30.8 pg (ref 26.0–34.0)
MCHC: 32.2 g/dL (ref 30.0–36.0)
MCV: 95.6 fL (ref 80.0–100.0)
Platelets: 200 10*3/uL (ref 150–400)
RBC: 3.67 MIL/uL — ABNORMAL LOW (ref 3.87–5.11)
RDW: 13.8 % (ref 11.5–15.5)
WBC: 8 10*3/uL (ref 4.0–10.5)
nRBC: 0 % (ref 0.0–0.2)

## 2020-06-22 LAB — ECHOCARDIOGRAM LIMITED
AR max vel: 0.75 cm2
AV Area VTI: 0.7 cm2
AV Area mean vel: 0.71 cm2
AV Mean grad: 38 mmHg
AV Peak grad: 58.1 mmHg
Ao pk vel: 3.81 m/s
Height: 68 in
S' Lateral: 3.1 cm
Weight: 3393.32 oz

## 2020-06-22 LAB — GLUCOSE, CAPILLARY
Glucose-Capillary: 148 mg/dL — ABNORMAL HIGH (ref 70–99)
Glucose-Capillary: 139 mg/dL — ABNORMAL HIGH (ref 70–99)
Glucose-Capillary: 174 mg/dL — ABNORMAL HIGH (ref 70–99)
Glucose-Capillary: 141 mg/dL — ABNORMAL HIGH (ref 70–99)

## 2020-06-22 LAB — MAGNESIUM: Magnesium: 1.9 mg/dL (ref 1.7–2.4)

## 2020-06-22 MED ORDER — IOHEXOL 350 MG/ML SOLN
100.0000 mL | Freq: Once | INTRAVENOUS | Status: AC | PRN
  Administered 2020-06-22: 100 mL via INTRAVENOUS

## 2020-06-22 MED ORDER — DILTIAZEM HCL ER COATED BEADS 180 MG PO CP24
180.0000 mg | ORAL_CAPSULE | Freq: Two times a day (BID) | ORAL | Status: DC
  Administered 2020-06-22 – 2020-06-24 (×4): 180 mg via ORAL
  Filled 2020-06-22 (×4): qty 1

## 2020-06-22 MED ORDER — AMIODARONE HCL 200 MG PO TABS
200.0000 mg | ORAL_TABLET | Freq: Two times a day (BID) | ORAL | Status: DC
  Administered 2020-06-22 (×2): 200 mg via ORAL
  Filled 2020-06-22 (×3): qty 1

## 2020-06-22 NOTE — Progress Notes (Signed)
  Echocardiogram 2D Echocardiogram has been performed.  Briana Foster 06/22/2020, 5:03 PM

## 2020-06-22 NOTE — Progress Notes (Signed)
Carotid duplex has been completed.   Preliminary results in CV Proc.   Blanch Media 06/22/2020 2:18 PM

## 2020-06-22 NOTE — Consult Note (Addendum)
Cardiology Consultation:   Patient ID: Briana Foster MRN: [id number]; DOB: [birth year]  Admit date: 06/20/2020 Date of Consult: 06/22/2020  PCP:  Georgann Housekeeper, MD   [facility] Providers Cardiologist:  Lesleigh Noe, MD  Electrophysiologist:  Lanier Prude, MD  {    Patient Profile:   Briana Foster is a 75 y.o. female with a hx of DM, HLD, VHD (AS) and AFib (permanent) who is being seen 06/22/2020 for the evaluation of AFib at the request of Dr. Scharlene Gloss.  History of Present Illness:   Briana Foster last saw Dr. Lalla Brothers 02/25/20, he described her as permanent AFib, noting she had previously been tried and failed to maintain SR on amiodarone) at that time her meds further adjusted (increased metoprolol to  PO BID from , and increased cardizem to  PO BID from daily) for better HR control with plans for a 3 mo follow up and repeat echo to monitor her AS.  She saw Dr. Katrinka Blazing 06/09/20 her HR was 59bpm. 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  She saw Dr. Clifton James 06/10/20, her HR 121bpm, thoughts towards adding amio for rate control, pending her cath and plans for pre-TAVR scans planned as well as CT surgeon consult.  She had her cath 06/17/20, found with RVR (having not taken her meds that AM) treated with IV lopressor, cath noted   Angiography demonstrated widely patent coronaries.  Co. oximetry demonstrated a PA saturation of 52% calculated cardiac output 4.2 L/min pulmonary vascular resistance 5.3 Wood units and a roughly 20 mm gradient was calculated across the aortic valve  Peak to peak aortic valve gradient is difficult to assess due to A. fib with relatively rapid rate.  Eyeball approximation is 20 mmHg.  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 She was started on amiodarone for additional rate control Cardiology team subsequently stopped her amio with question if she was taking her meds (metoprolol) at home correctly.   looks like she may have also gotten dilt gtt very briefly  Currently on  Metoprolol tart  Q6 Dilt  BID Eliquis  BID  LABS K+ 36.0  Rechecked without intervention was 3.7  BUN/Creat 16/0.99 Mag 1.9 WBC 8.0 H/H 11/35 Plts 200  HS Trop 8 > 7 BNP 250  EP is asked to evaluate for poss pace/ablate   Past Medical History:  Diagnosis Date  . Anemia   . Arthritis 07-20-12   hands  . Asthma   . Back pain   . Cardiomegaly   . Class 1 obesity with serious comorbidity and body mass index (BMI) of 31.0 to 31.9 in adult 03/26/2018  . Depression   . Essential hypertension 03/26/2018  . GERD (gastroesophageal reflux disease)    "comes and goes" - no meds currently  . Heart murmur    told as teenager  . Hyperlipidemia   . Joint pain   . Restless leg syndrome   . Rheumatoid arthritis (HCC)   . Shortness of breath   . Tiredness   . Type 2 diabetes mellitus  without complication, without long-term current use of insulin (HCC) 04/10/2018  . Vitamin D deficiency     Past Surgical History:  Procedure Laterality Date  . BUNIONECTOMY  20 yrs ago   bil feet  . BUNIONECTOMY  11/30/2011   Procedure: Arbutus Leas;  Surgeon: Drucilla Schmidt, MD;  Location: [facility] ORS;  Service: Orthopedics;  Laterality: Bilateral;  RIGHT FOOT EXCISION OF BUNIONETTE AND PARTIAL   PROXIMAL PHALANGECTOMY OF 5TH TOE LEFT FOOT FUNK BUNIONECTOMY,EXCISION OF BUNIONETTE   . CARDIOVERSION N/A 11/05/2019   Procedure: CARDIOVERSION;  Surgeon:  Lewayne Bunting, MD;  Location: [facility] ENDOSCOPY;  Service: Cardiovascular;  Laterality: N/A;  . CARDIOVERSION N/A 12/05/2019   Procedure: CARDIOVERSION;  Surgeon: Parke Poisson, MD;  Location: [facility] ENDOSCOPY;  Service: Cardiovascular;  Laterality: N/A;  . COLONOSCOPY WITH PROPOFOL N/A 08/07/2012   Procedure: COLONOSCOPY WITH PROPOFOL;  Surgeon: Charolett Bumpers, MD;  Location: [facility] ENDOSCOPY;  Service: Endoscopy;  Laterality: N/A;  . MOUTH SURGERY     teeth extractions  . RIGHT/LEFT HEART CATH AND CORONARY ANGIOGRAPHY N/A 06/17/2020   Procedure: RIGHT/LEFT HEART CATH AND CORONARY ANGIOGRAPHY;  Surgeon: Lyn Records, MD;  Location: [facility] INVASIVE CV LAB;  Service: Cardiovascular;  Laterality: N/A;  . TONSILLECTOMY    . TUBAL LIGATION    . WRIST SURGERY       Home Medications:  Prior to Admission medications   Medication Sig Start Date End Date Taking? Authorizing Provider  albuterol (VENTOLIN HFA) 108 (90 Base) MCG/ACT inhaler Inhale 2 puffs into the lungs every 4 (four) hours as needed for wheezing or shortness of breath.   Yes [provider]  amiodarone (PACERONE) 200 MG tablet Take one tablet by mouth twice daily for 3 weeks, then decrease to one tablet by mouth daily. Patient taking differently: Take 200 mg by mouth daily. 06/11/20  Yes Lyn Records, MD  amLODipine (NORVASC) 5 MG tablet Take 5 mg by mouth daily in the afternoon.   Yes [provider]  apixaban (ELIQUIS) 5 MG TABS tablet Take 1 tablet (5 mg total) by mouth 2 (two) times daily. Resume at 8 PM tonight Patient taking differently: Take 5 mg by mouth 2 (two) times daily. 06/17/20  Yes Lyn Records, MD  cetirizine (ZYRTEC) 10 MG tablet Take 10 mg by mouth daily as needed for allergies.   Yes [provider]  diclofenac Sodium (VOLTAREN) 1 % GEL Apply 1 application topically 2 (two) times daily as needed (pain). 02/21/20  Yes [provider]  diltiazem (CARDIZEM CD) 180 MG 24 hr capsule Take 1  capsule (180 mg total) by mouth in the morning and at bedtime. 06/10/20  Yes Kathleene Hazel, MD  metFORMIN (GLUCOPHAGE-XR) 750 MG 24 hr tablet Take 750 mg by mouth daily in the afternoon. 01/08/20  Yes [provider]  metoprolol tartrate (LOPRESSOR) 50 MG tablet Take 50 mg by mouth daily.   Yes [provider]  Multiple Vitamins-Minerals (MULTIVITAMIN WITH MINERALS) tablet Take 1 tablet by mouth daily.   Yes [provider]  Naphazoline-Pheniramine (ALLERGY EYE OP) Place 1 drop into both eyes daily as needed (red/itchy eyes).   Yes [provider]  pantoprazole (PROTONIX) 40 MG tablet Take 40 mg by mouth daily as needed (heartburn).   Yes [provider]  rOPINIRole (REQUIP) 4 MG tablet Take 4 mg by mouth at bedtime.   Yes [provider]  rosuvastatin (CRESTOR) 20 MG tablet Take 20 mg by mouth daily in  the afternoon.   Yes [provider]  vitamin C (ASCORBIC ACID) 500 MG tablet Take 500 mg by mouth daily.   Yes [provider]    Inpatient Medications: Scheduled Meds: . apixaban  5 mg Oral BID  . diltiazem  180 mg Oral BID  . insulin aspart  0-15 Units Subcutaneous TID WC  . metoprolol tartrate  50 mg Oral Q6H  . rOPINIRole  4 mg Oral QHS  . rosuvastatin  20 mg Oral Q1500   Continuous Infusions:  PRN Meds: acetaminophen, albuterol, ondansetron (ZOFRAN) IV, pantoprazole  Allergies:    Allergies  Allergen Reactions  . Bee Venom Shortness Of Breath and Rash  . Apricot Flavor Swelling    Eyes swell shut  . Atorvastatin Other (See Comments)  . Wasp Venom Other (See Comments)    Social History:   Social History   Socioeconomic History  . Marital status: Widowed    Spouse name: Not on file  . Number of children: 2  . Years of education: Not on file  . Highest education level: Not on file  Occupational History  . Occupation: Retired-worked at [employer]  . Smoking status: Never Smoker   . Smokeless tobacco: Never Used  Substance and Sexual Activity  . Alcohol use: No  . Drug use: No  . Sexual activity: Not Currently  Other Topics Concern  . Not on file  Social History Narrative  . Not on file   Social Determinants of Health   Financial Resource Strain: Not on file  Food Insecurity: Not on file  Transportation Needs: Not on file  Physical Activity: Not on file  Stress: Not on file  Social Connections: Not on file  Intimate Partner Violence: Not on file    Family History:   Family History  Problem Relation Age of Onset  . Heart disease Mother   . Stroke Mother   . Cancer Father        Prostate     ROS:  Please see the history of present illness.  All other ROS reviewed and negative.     Physical Exam/Data:   Vitals:   06/21/20 2045 06/21/20 2352 06/22/20 0336 06/22/20 0753  BP: 134/86 (!) 141/92 (!) 152/93 137/89  Pulse: 70 76 92 100  Resp: 18 18 16 18   Temp: 97.7 F (36.5 C) 97.7 F (36.5 C) 97.7 F (36.5 C) 97.6 F (36.4 C)  TempSrc: Oral Oral Oral Oral  SpO2:   97% 93%  Weight:   96.2 kg   Height:        Intake/Output Summary (Last 24 hours) at 06/22/2020 1057 Last data filed at 06/21/2020 2041 Gross per 24 hour  Intake 582.89 ml  Output --  Net 582.89 ml   Last 3 Weights 06/22/2020 06/21/2020 06/17/2020  Weight (lbs) 212 lb 1.3 oz 211 lb 10.3 oz 212 lb 1.3 oz  Weight (kg) 96.2 kg 96 kg 96.2 kg     Body mass index is 32.25 kg/m.  General:  Well nourished, well developed, in no acute distress HEENT: normal Lymph: no adenopathy Neck: no JVD Endocrine:  No thryomegaly Vascular: No carotid bruits Cardiac:  irreg-irreg; 3/6SM Lungs:  CTA b/l, no wheezing, rhonchi or rales  Abd: soft, nontender, not distended  Ext: no edema Musculoskeletal:  No deformities Skin: warm and dry  Neuro:  No focal abnormalities noted Psych:  Normal affect   EKG:  The EKG was personally reviewed and demonstrates:    AFib  95bpm  Old 06/10/20: AFib  121bpm  Telemetry:  Telemetry was personally reviewed and demonstrates:   AFib 80's -90's  Relevant CV Studies:  RHC/LHC/corrs Result date: 06/17/20  Very poor rate control related to patient not taking medications. Atrial fibrillation with RVR varying between 101 160 bpm. Therefore, the patient was treated with IV metoprolol receiving 15 mg and 5 mg doses over 30 minutes before proceeding. This decrease the heart rate into the 90 to 115 bpm range.  Widely patent coronary arteries. Vessels are tortuous but have no significant obstruction.  Normal pulmonary artery pressures with mean pulmonary wedge 23 mmHg (influenced by rate).  Variable RR interval made it difficult to appropriately assess left ventricular gradient. "Eyeball" peak to peak gradient is 20 mmHg.  AV coronary and mitral annular calcification noted on cine fluoroscopy.  Cardiac output by Fick 4.82 L/min with index 1.99 L/min/m. These findings are consistent with potential low-flow low gradient aortic stenosis as suspected. Calculated aortic valve area 1.0 cm based upon a mean gradient of 14 mmHg.  TTE Result date: 05/11/20 1. Left ventricular ejection fraction, by estimation, is 55 to 60%. The  left ventricle has normal function. The left ventricle has no regional  wall motion abnormalities. There is mild left ventricular hypertrophy.  Left ventricular diastolic parameters  are indeterminate.  2. Right ventricular systolic function is normal. The right ventricular  size is normal. There is normal pulmonary artery systolic pressure. The  estimated right ventricular systolic pressure is 29.6 mmHg.  3. Left atrial size was moderately dilated.  4. Right atrial size was moderately dilated.  5. The mitral valve is degenerative. Trivial mitral valve regurgitation.  Moderate mitral annular calcification.  6. The aortic valve is tricuspid. Aortic valve regurgitation is trivial.  Possible paradoxical low  flow/low gradient severe aortic valve stenosis,  however patient is in atrial fibrillation with RVR so would repeat echo  when rate is better-controlled.  Aortic valve area, by VTI measures 0.73 cm. Aortic valve mean gradient  measures 29.0 mmHg.  7. The inferior vena cava is normal in size with greater than 50%  respiratory variability, suggesting right atrial pressure of 3 mmHg.  8. Atrial fibrillation with RVR.    Laboratory Data:  High Sensitivity Troponin:   Recent Labs  Lab 06/21/20 0002 06/21/20 0427  TROPONINIHS 8 7     Chemistry Recent Labs  Lab 06/17/20 0922 06/21/20 0002 06/21/20 0218 06/22/20 0239  NA 142 138  --  137  K 3.5 6.0* 3.7 4.0  CL  --  107  --  106  CO2  --  23  --  25  GLUCOSE  --  152*  --  154*  BUN  --  16  --  16  CREATININE  --  0.99  --  0.99  CALCIUM  --  9.4  --  9.2  GFRNONAA  --  >60  --  >60  ANIONGAP  --  8  --  6    Recent Labs  Lab 06/21/20 0002  PROT 6.9  ALBUMIN 3.9  AST 63*  ALT 42  ALKPHOS 67  BILITOT 1.5*   Hematology Recent Labs  Lab 06/17/20 0922 06/21/20 0002 06/22/20 0239  WBC  --  10.1 8.0  RBC  --  3.94 3.67*  HGB 11.6* 12.5 11.3*  HCT 34.0* 38.3 35.1*  MCV  --  97.2 95.6  MCH  --  31.7 30.8  MCHC  --  32.6 32.2  RDW  --  14.1 13.8  PLT  --  160 200   BNP Recent Labs  Lab 06/21/20 1648  BNP 250.5*    DDimer No results for input(s): DDIMER in the last 168 hours.   Radiology/Studies:  DG Chest Portable 1 View Result Date: 06/21/2020 CLINICAL DATA:  Shortness of breath. EXAM: PORTABLE CHEST 1 VIEW COMPARISON:  December 07, 2016 FINDINGS: Mild, chronic appearing increased lung markings are seen without evidence of focal consolidation, pleural effusion or pneumothorax. Very mild peribronchial thickening is noted within the perihilar regions, bilaterally. The cardiac silhouette is mildly enlarged. The visualized skeletal structures are unremarkable. IMPRESSION: Mild bilateral peribronchial  thickening without an acute infiltrate. Electronically Signed   By: Aram Candela M.D.   On: 06/21/2020 00:18    CT Angio Chest/Abd/Pel for Dissection W and/or Wo Contrast Result Date: 06/21/2020 CLINICAL DATA:  Abdominal pain, aortic dissection suspected. weakness and a-fib. heart cath performed Wednesday. EXAM: CT ANGIOGRAPHY CHEST, ABDOMEN AND PELVIS TECHNIQUE: Non-contrast CT of the chest was initially obtained. Multidetector CT imaging through the chest, abdomen and pelvis was performed using the standard protocol during bolus administration of intravenous contrast. Multiplanar reconstructed images and MIPs were obtained and reviewed to evaluate the vascular anatomy. CONTRAST:  OMNIPAQUE IOHEXOL 350 MG/ML SOLN COMPARISON:  None. FINDINGS: CTA CHEST FINDINGS Cardiovascular: Satisfactory opacification of the pulmonary arteries to the segmental level. No evidence of pulmonary embolism. Satisfactory opacification of the aorta with no aneurysm or dissection. Mild cardiomegaly. Question patent foramen ovale. No pericardial effusion. Four-vessel coronary artery calcifications. Mitral annular calcifications at least moderate. Mediastinum/Nodes: Multiple borderline enlarged mediastinal lymph nodes: 0.9 cm right paratracheal (7:45). Prominent left hilar lymph nodes. Likely enlarged right hilar lymph node: 1.3 cm (7:67). No enlarged axillary lymph nodes. Thyroid gland, trachea, and esophagus demonstrate no significant findings. Lungs/Pleura: Bilateral lower lobe subsegmental atelectasis. Few scattered calcified and noncalcified pulmonary micronodules (6:34, levin). No pulmonary mass. No focal consolidation. No pleural effusion. No pneumothorax. 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 Diffuse bronchial wall thickening. Musculoskeletal: No chest wall abnormality. No suspicious lytic or blastic osseous lesions. No acute displaced fracture. Multilevel degenerative  changes of the spine. Degenerative changes of bilateral shoulders. Review of the MIP images confirms the above findings. CTA ABDOMEN AND PELVIS FINDINGS VASCULAR Aorta: At least moderate atherosclerotic plaque. Normal caliber aorta without aneurysm, dissection, vasculitis or significant stenosis. 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 Patent without evidence of aneurysm, dissection, vasculitis or significant stenosis. SMA: Patent without evidence of aneurysm, dissection, vasculitis or significant stenosis. Renals: Atherosclerotic plaque. Both renal arteries are patent without evidence of aneurysm, dissection, vasculitis, fibromuscular dysplasia or significant stenosis. IMA: Patent without evidence of aneurysm, dissection, vasculitis or significant stenosis. Inflow: Patent without evidence of aneurysm, dissection, vasculitis or significant stenosis. Veins: No obvious venous abnormality within the limitations of this arterial phase study. Review of the MIP images confirms the above findings. NON-VASCULAR Hepatobiliary: No focal liver abnormality. No gallstones, gallbladder wall thickening, or pericholecystic fluid. No biliary dilatation. Pancreas: No focal lesion. Normal pancreatic contour. No surrounding inflammatory changes. No main pancreatic ductal dilatation. Spleen: Normal in size without focal abnormality. Adrenals/Urinary Tract: No adrenal nodule bilaterally. Bilateral kidneys enhance symmetrically. No hydronephrosis. No hydroureter. The urinary bladder is unremarkable. Stomach/Bowel: Stomach is within normal limits. No evidence of bowel wall thickening or dilatation. Scattered colonic diverticulosis. Appendix appears normal. Lymphatic: No lymphadenopathy. Reproductive: Uterus and bilateral adnexa are  unremarkable. Other: No intraperitoneal free fluid. No intraperitoneal free gas. No organized fluid collection. Musculoskeletal: No abdominal wall  hernia or abnormality. No suspicious lytic or blastic osseous lesions. No acute displaced fracture. Multilevel degenerative changes of the spine. Review of the MIP images confirms the above findings. IMPRESSION: 1. No acute aortic abnormality. Aortic Atherosclerosis (ICD10-I70.0) including four-vessel coronary artery calcifications and moderate mitral annular calcifications. 2. No central or segmental pulmonary embolus. 3. Nonspecific right hilar lymphadenopathy and prominent left hilar/mediastinal lymph nodes. Findings may be reactive in etiology. Recommend attention on follow-up. 4. 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 Correlation with prior cross-sectional imaging would be of value. Recommend attention on follow-up. 5. Mild cardiomegaly with question of patent foramen ovale. 6. Scattered colonic diverticulosis with no acute diverticulitis. Electronically Signed   By: Tish Frederickson M.D.   On: 06/21/2020 04:21     Assessment and Plan:   1. Permanent Afib     Of late rates have been fast and meds titrated out patient     Here and with EMS her HR has been controlled     She tells me she was taking all of her medicines every day as written, and on her AVS post cath has metoprolol 50mg  daily and started on an amiodarone load/taper as well    I will discuss the case with Dr. Graciela Husbands and he will see her today.  I am not entirely convince of need for pace and ablate. ? Will treating AS help her rates?  2. Severe AS     Pre-TAVR eval is underway     I have reached out to structural heart APP, while she is here they will plan to get her CTs done, though TAVR procedure won't happen while here  3. ? OSA     She was placed on O2 last night the RN tells me reportedly destated while asleep     I don't see this charted, but verbally reported     She has never been tested for sleep apnea, does not think she has it, says she sleeps poorly tends to be restless has a hard  time falling and staying asleep.     May benefit from sleep study     Risk Assessment/Risk Scores:  { For questions or updates, please contact [facility] Please consult [email or web address] for contact info under    Signed, Sheilah Pigeon, PA-C  06/22/2020 10:57 AM  Aortic stenosis-severe quiescient low-flow  Atrial fibrillation-permanent-rate variable  Obstructive sleep apnea  PE as above   the patient has severe aortic stenosis and is currently undergoing evaluation for TAVR and has atrial fibrillation with variable rates and has been designated as permanent.  The issue for [place] has been whether she would benefit from AV junction ablation and pacing; however, since hospitalization and perhaps because the addition of low-dose amiodarone her heart rates have been very well controlled in the 60--100 range.  There have been a few pauses which may become an issue requiring further modifications of her medications, but having discussed this with Dr. Esmond Camper, he can proceed with TAVR prior to any further intervention on her atrial fibrillation if her rates remain well controlled.  This is also the patient's preference.  Hence, we will follow from afar.  Please let us know if we can help.  For now we will continue amiodarone at 200 twice daily.

## 2020-06-22 NOTE — Progress Notes (Signed)
Progress Note  Patient Name: Briana Foster Date of Encounter: 06/22/2020  [facility] Cardiologist: Lesleigh Noe, MD  [facility] EP: Steffanie Dunn  Subjective   Resting in bed this AM, no new concerns. Discussed plan for evaluation for TAVR/SAVR. Discussed rate control. She reports that she tolerated BID diltiazem without issues previously. Feels breathing is stable, no chest pain.  Inpatient Medications    Scheduled Meds: . apixaban  5 mg Oral BID  . diltiazem  180 mg Oral Daily  . insulin aspart  0-15 Units Subcutaneous TID WC  . metoprolol tartrate  50 mg Oral Q6H  . rOPINIRole  4 mg Oral QHS  . rosuvastatin  20 mg Oral Q1500   Continuous Infusions:  PRN Meds: acetaminophen, albuterol, ondansetron (ZOFRAN) IV, pantoprazole   Vital Signs    Vitals:   06/21/20 2045 06/21/20 2352 06/22/20 0336 06/22/20 0753  BP: 134/86 (!) 141/92 (!) 152/93 137/89  Pulse: 70 76 92 100  Resp: Temp: 97.7 F (36.5 C) 97.7 F (36.5 C) 97.7 F (36.5 C) 97.6 F (36.4 C)  TempSrc: Oral Oral Oral Oral  SpO2:   97% 93%  Weight:   96.2 kg   Height:        Intake/Output Summary (Last 24 hours) at 06/22/2020 0909 Last data filed at 06/21/2020 2041 Gross per 24 hour  Intake 582.89 ml  Output --  Net 582.89 ml   Last 3 Weights 06/22/2020 06/21/2020 06/17/2020  Weight (lbs) 212 lb 1.3 oz 211 lb 10.3 oz 212 lb 1.3 oz  Weight (kg) 96.2 kg 96 kg 96.2 kg      Telemetry    Atrial fibrillation, around 80-90 bpm average. Does have occasional pauses up to 2 seconds - Personally Reviewed  ECG    06/21/20 atrial fibrillation at 95 bpm - Personally Reviewed  Physical Exam   GEN: No acute distress.   Neck: No JVD Cardiac: irregularly irregular S1 and S2, no rubs, or gallops. 2/6 SEM Respiratory: Clear to auscultation bilaterally. GI: Soft, nontender, non-distended  MS: No edema; No deformity. Neuro:  Nonfocal  Psych: Normal affect   Labs    High Sensitivity  Troponin:   Recent Labs  Lab 06/21/20 0002 06/21/20 0427  TROPONINIHS 8 7      Chemistry Recent Labs  Lab 06/17/20 0922 06/21/20 0002 06/21/20 0218 06/22/20 0239  NA 142 138  --  137  K 3.5 6.0* 3.7 4.0  CL  --  107  --  106  CO2  --  23  --  25  GLUCOSE  --  152*  --  154*  BUN  --  16  --  16  CREATININE  --  0.99  --  0.99  CALCIUM  --  9.4  --  9.2  PROT  --  6.9  --   --   ALBUMIN  --  3.9  --   --   AST  --  63*  --   --   ALT  --  42  --   --   ALKPHOS  --  67  --   --   BILITOT  --  1.5*  --   --   GFRNONAA  --  >60  --  >60  ANIONGAP  --  8  --  6     Hematology Recent Labs  Lab 06/17/20 0922 06/21/20 0002 06/22/20 0239  WBC  --  10.1 8.0  RBC  --  3.94 3.67*  HGB 11.6* 12.5 11.3*  HCT 34.0* 38.3 35.1*  MCV  --  97.2 95.6  MCH  --  31.7 30.8  MCHC  --  32.6 32.2  RDW  --  14.1 13.8  PLT  --  160 200    BNP Recent Labs  Lab 06/21/20 1648  BNP 250.5*     DDimer No results for input(s): DDIMER in the last 168 hours.   Radiology    DG Chest Portable 1 View  Result Date: 06/21/2020 CLINICAL DATA:  Shortness of breath. EXAM: PORTABLE CHEST 1 VIEW COMPARISON:  December 07, 2016 FINDINGS: Mild, chronic appearing increased lung markings are seen without evidence of focal consolidation, pleural effusion or pneumothorax. Very mild peribronchial thickening is noted within the perihilar regions, bilaterally. The cardiac silhouette is mildly enlarged. The visualized skeletal structures are unremarkable. IMPRESSION: Mild bilateral peribronchial thickening without an acute infiltrate. Electronically Signed   By: Aram Candela M.D.   On: 06/21/2020 00:18   CT Angio Chest/Abd/Pel for Dissection W and/or Wo Contrast  Result Date: 06/21/2020 CLINICAL DATA:  Abdominal pain, aortic dissection suspected. weakness and a-fib. heart cath performed Wednesday. EXAM: CT ANGIOGRAPHY CHEST, ABDOMEN AND PELVIS TECHNIQUE: Non-contrast CT of the chest was initially  obtained. Multidetector CT imaging through the chest, abdomen and pelvis was performed using the standard protocol during bolus administration of intravenous contrast. Multiplanar reconstructed images and MIPs were obtained and reviewed to evaluate the vascular anatomy. CONTRAST:  OMNIPAQUE IOHEXOL 350 MG/ML SOLN COMPARISON:  None. FINDINGS: CTA CHEST FINDINGS Cardiovascular: Satisfactory opacification of the pulmonary arteries to the segmental level. No evidence of pulmonary embolism. Satisfactory opacification of the aorta with no aneurysm or dissection. Mild cardiomegaly. Question patent foramen ovale. No pericardial effusion. Four-vessel coronary artery calcifications. Mitral annular calcifications at least moderate. Mediastinum/Nodes: Multiple borderline enlarged mediastinal lymph nodes: 0.9 cm right paratracheal (7:45). Prominent left hilar lymph nodes. Likely enlarged right hilar lymph node: 1.3 cm (7:67). No enlarged axillary lymph nodes. Thyroid gland, trachea, and esophagus demonstrate no significant findings. Lungs/Pleura: Bilateral lower lobe subsegmental atelectasis. Few scattered calcified and noncalcified pulmonary micronodules (6:34, levin). No pulmonary mass. No focal consolidation. No pleural effusion. No pneumothorax. 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 Diffuse bronchial wall thickening. Musculoskeletal: No chest wall abnormality. No suspicious lytic or blastic osseous lesions. No acute displaced fracture. Multilevel degenerative changes of the spine. Degenerative changes of bilateral shoulders. Review of the MIP images confirms the above findings. CTA ABDOMEN AND PELVIS FINDINGS VASCULAR Aorta: At least moderate atherosclerotic plaque. Normal caliber aorta without aneurysm, dissection, vasculitis or significant stenosis. 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 Patent without  evidence of aneurysm, dissection, vasculitis or significant stenosis. SMA: Patent without evidence of aneurysm, dissection, vasculitis or significant stenosis. Renals: Atherosclerotic plaque. Both renal arteries are patent without evidence of aneurysm, dissection, vasculitis, fibromuscular dysplasia or significant stenosis. IMA: Patent without evidence of aneurysm, dissection, vasculitis or significant stenosis. Inflow: Patent without evidence of aneurysm, dissection, vasculitis or significant stenosis. Veins: No obvious venous abnormality within the limitations of this arterial phase study. Review of the MIP images confirms the above findings. NON-VASCULAR Hepatobiliary: No focal liver abnormality. No gallstones, gallbladder wall thickening, or pericholecystic fluid. No biliary dilatation. Pancreas: No focal lesion. Normal pancreatic contour. No surrounding inflammatory changes. No main pancreatic ductal dilatation. Spleen: Normal in size without focal abnormality. Adrenals/Urinary  Tract: No adrenal nodule bilaterally. Bilateral kidneys enhance symmetrically. No hydronephrosis. No hydroureter. The urinary bladder is unremarkable. Stomach/Bowel: Stomach is within normal limits. No evidence of bowel wall thickening or dilatation. Scattered colonic diverticulosis. Appendix appears normal. Lymphatic: No lymphadenopathy. Reproductive: Uterus and bilateral adnexa are unremarkable. Other: No intraperitoneal free fluid. No intraperitoneal free gas. No organized fluid collection. Musculoskeletal: No abdominal wall hernia or abnormality. No suspicious lytic or blastic osseous lesions. No acute displaced fracture. Multilevel degenerative changes of the spine. Review of the MIP images confirms the above findings. IMPRESSION: 1. No acute aortic abnormality. Aortic Atherosclerosis (ICD10-I70.0) including four-vessel coronary artery calcifications and moderate mitral annular calcifications. 2. No central or segmental pulmonary  embolus. 3. Nonspecific right hilar lymphadenopathy and prominent left hilar/mediastinal lymph nodes. Findings may be reactive in etiology. Recommend attention on follow-up. 4. 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 Correlation with prior cross-sectional imaging would be of value. Recommend attention on follow-up. 5. Mild cardiomegaly with question of patent foramen ovale. 6. Scattered colonic diverticulosis with no acute diverticulitis. Electronically Signed   By: Tish Frederickson M.D.   On: 06/21/2020 04:21    Cardiac Studies   RHC/LHC/corrs Result date: 06/17/20  Very poor rate control related to patient not taking medications. Atrial fibrillation with RVR varying between 101 160 bpm. Therefore, the patient was treated with IV metoprolol receiving 15 mg and 5 mg doses over 30 minutes before proceeding. This decrease the heart rate into the 90 to 115 bpm range.  Widely patent coronary arteries. Vessels are tortuous but have no significant obstruction.  Normal pulmonary artery pressures with mean pulmonary wedge 23 mmHg (influenced by rate).  Variable RR interval made it difficult to appropriately assess left ventricular gradient. "Eyeball" peak to peak gradient is 20 mmHg.  AV coronary and mitral annular calcification noted on cine fluoroscopy.  Cardiac output by Fick 4.82 L/min with index 1.99 L/min/m. These findings are consistent with potential low-flow low gradient aortic stenosis as suspected. Calculated aortic valve area 1.0 cm based upon a mean gradient of 14 mmHg.  TTE Result date: 05/11/20 1. Left ventricular ejection fraction, by estimation, is 55 to 60%. The  left ventricle has normal function. The left ventricle has no regional  wall motion abnormalities. There is mild left ventricular hypertrophy.  Left ventricular diastolic parameters  are indeterminate.  2. Right ventricular systolic function is normal. The right ventricular   size is normal. There is normal pulmonary artery systolic pressure. The  estimated right ventricular systolic pressure is 29.6 mmHg.  3. Left atrial size was moderately dilated.  4. Right atrial size was moderately dilated.  5. The mitral valve is degenerative. Trivial mitral valve regurgitation.  Moderate mitral annular calcification.  6. The aortic valve is tricuspid. Aortic valve regurgitation is trivial.  Possible paradoxical low flow/low gradient severe aortic valve stenosis,  however patient is in atrial fibrillation with RVR so would repeat echo  when rate is better-controlled.  Aortic valve area, by VTI measures 0.73 cm. Aortic valve mean gradient  measures 29.0 mmHg.  7. The inferior vena cava is normal in size with greater than 50%  respiratory variability, suggesting right atrial pressure of 3 mmHg.  8. Atrial fibrillation with RVR.   Patient Profile     75 y.o. female with permanent atrial fibrillation, aortic stenosis presenting with fatigue/generalized weakness,  Assessment & Plan    Permanent atrial fibrillation: -CHA2DS2/VAS Stroke Risk Points=4  -continue apixaban 5 mg BID -on  rate control with diltiazem 180 mg daily and metoprolol tartrate 50 mg q6 hours. -no utility to rhythm control given permanent atrial fib. Per Dr. Lovena Neighbours last note, trialed on 180 mg BID diltiazem. Had nausea/emesis on amiodarone. -need better rate control ideally for CT study for TAVR.  -will add back second daily dose of 180 mg diltiazem for total of 180 mg BID. However, need to monitor for pauses with increased nodal agent. Will let Dr. Lalla Brothers know the plan.  Low flow, low gradient AS: -had R/LHC as above -carotid dopplers ordered -PT eval ordered -appreciate structural heart team evaluation -with contrast shortage, will discuss plans for CT with structural team. Had CT angio chest/abd/pelvis 06/21/20  Hypercholesterolemia: per report, no recent lipids available. -coronary  calcium, but no obstructive CAD by cath -continue rosuvastatin 20 mg daily  Hypertension: -BP stable on metoprolol and diltiazem  Type II diabetes: -last A1c 7.0 03/22/18 -blood sugars well controlled this admission -continue sliding scale insulin  Chronic anemia: Hgb 11.3 today, recent range 11-12  For questions or updates, please contact [facility] Please consult [email or web address] for contact info under     Signed, Jodelle Red, MD  06/22/2020, 9:09 AM

## 2020-06-22 NOTE — Evaluation (Signed)
Physical Therapy Evaluation Patient Details Name: Briana Foster MRN: [id number] DOB: [birth year] Today's Date: 06/22/2020   History of Present Illness  Afton B Balderrama is a 75 y.o. female with paradoxical AS, pAF, anemia, HLD, RA, GERD, HTN, and DM2 who presents with acute on chronic fatigue/weakness.  Clinical Impression  Patient received in bed, she reports she is feeling much better. Agrees to PT assessment. Patient is mod independent with bed mobility and transfers. Ambulated with single UE support due to mild unsteadiness,150 feet. Patient will continue to benefit from skilled PT while here to improve activity tolerance and strength for safe return home.       Follow Up Recommendations No PT follow up    Equipment Recommendations  None recommended by PT    Recommendations for Other Services       Precautions / Restrictions Precautions Precautions: Fall Restrictions Weight Bearing Restrictions: No      Mobility  Bed Mobility Overal bed mobility: Modified Independent                  Transfers Overall transfer level: Modified independent                  Ambulation/Gait Ambulation/Gait assistance: Min guard Gait Distance (Feet): 150 Feet Assistive device: 1 person hand held assist Gait Pattern/deviations: Step-through pattern;Decreased step length - right;Decreased step length - left;Drifts right/left;Narrow base of support Gait velocity: decr   General Gait Details: patient is slightly unsteady during ambulation, reports continued weakness but improved since admission. Patient reports no SOB although sounds wheezy.  Stairs            Wheelchair Mobility    Modified Rankin (Stroke Patients Only)       Balance Overall balance assessment: Needs assistance Sitting-balance support: Feet supported Sitting balance-Leahy Scale: Normal     Standing balance support: Single extremity supported;During functional activity Standing  balance-Leahy Scale: Good Standing balance comment: slightly unsteady                             Pertinent Vitals/Pain Pain Assessment: No/denies pain    Home Living Family/patient expects to be discharged to:: Private residence Living Arrangements: Children Available Help at Discharge: Family;Available PRN/intermittently Type of Home: House       Home Layout: One level Home Equipment: None      Prior Function Level of Independence: Independent               Hand Dominance        Extremity/Trunk Assessment   Upper Extremity Assessment Upper Extremity Assessment: Generalized weakness    Lower Extremity Assessment Lower Extremity Assessment: Generalized weakness    Cervical / Trunk Assessment Cervical / Trunk Assessment: Normal  Communication   Communication: No difficulties  Cognition Arousal/Alertness: Awake/alert Behavior During Therapy: WFL for tasks assessed/performed Overall Cognitive Status: Within Functional Limits for tasks assessed                                        General Comments      Exercises     Assessment/Plan    PT Assessment Patient needs continued PT services  PT Problem List Decreased strength;Decreased mobility;Decreased activity tolerance;Decreased balance;Decreased safety awareness       PT Treatment Interventions Therapeutic exercise;Gait training;Balance training;Functional mobility training;Therapeutic activities;Patient/family education    PT Goals (Current  goals can be found in the Care Plan section)  Acute Rehab PT Goals Patient Stated Goal: to have this valve replaced, return home PT Goal Formulation: With patient Time For Goal Achievement: 07/06/20 Potential to Achieve Goals: Good    Frequency Min 3X/week   Barriers to discharge Decreased caregiver support daughter lives with her but she works in the am, is home around 1pm    Co-evaluation               AM-PAC PT "6  Clicks" Mobility  Outcome Measure Help needed turning from your back to your side while in a flat bed without using bedrails?: None Help needed moving from lying on your back to sitting on the side of a flat bed without using bedrails?: None Help needed moving to and from a bed to a chair (including a wheelchair)?: A Little Help needed standing up from a chair using your arms (e.g., wheelchair or bedside chair)?: A Little Help needed to walk in hospital room?: A Little Help needed climbing 3-5 steps with a railing? : A Little 6 Click Score: 20    End of Session Equipment Utilized During Treatment: Gait belt Activity Tolerance: Patient limited by fatigue Patient left: in bed;with call bell/phone within reach Nurse Communication: Mobility status PT Visit Diagnosis: Unsteadiness on feet (R26.81);Muscle weakness (generalized) (M62.81);Difficulty in walking, not elsewhere classified (R26.2)    Time: 3875-6433 PT Time Calculation (min) (ACUTE ONLY): 16 min   Charges:   PT Evaluation $PT Eval Moderate Complexity: 1 Mod          Ginna Schuur, PT, GCS 06/22/20,3:02 PM

## 2020-06-23 ENCOUNTER — Telehealth: Payer: Self-pay | Admitting: Interventional Cardiology

## 2020-06-23 DIAGNOSIS — R5383 Other fatigue: Secondary | ICD-10-CM

## 2020-06-23 LAB — GLUCOSE, CAPILLARY
Glucose-Capillary: 152 mg/dL — ABNORMAL HIGH (ref 70–99)
Glucose-Capillary: 185 mg/dL — ABNORMAL HIGH (ref 70–99)
Glucose-Capillary: 192 mg/dL — ABNORMAL HIGH (ref 70–99)
Glucose-Capillary: 171 mg/dL — ABNORMAL HIGH (ref 70–99)

## 2020-06-23 MED ORDER — METOPROLOL TARTRATE 50 MG PO TABS
50.0000 mg | ORAL_TABLET | Freq: Two times a day (BID) | ORAL | Status: DC
  Administered 2020-06-23 – 2020-06-24 (×2): 50 mg via ORAL
  Filled 2020-06-23 (×2): qty 1

## 2020-06-23 NOTE — Telephone Encounter (Signed)
Attempted phone call to pt and left voicemail to contact triage at [phone]. 

## 2020-06-23 NOTE — Progress Notes (Signed)
We are concerning about combining anti-arrhythmic medications could cause EKG showing slow Atrial fib and occasionally having long pause. HR 46-60s. BP 108/77 -148/93 mmHg. Pt has no acute distress. We will continue to monitor and hand off to morning round.   Filiberto Pinks, RN

## 2020-06-23 NOTE — Progress Notes (Signed)
Spoke w/ Daughter Martyn Malay; concerned about visitation limited to 2 people; explained policy; offered support; --JM

## 2020-06-23 NOTE — Progress Notes (Signed)
Physical Therapy Treatment Patient Details Name: Briana Foster MRN: [id number] DOB: 1945/07/27 Today's Date: 06/23/2020    History of Present Illness Briana Foster is a 75 y.o. female who presents with acute on chronic fatigue/weakness. Pt found to have afib and severe AS and is being worked up for TAVR. PMH includes pAF, anemia, HLD, RA, GERD, HTN, and DM2.    PT Comments    Pt progressing towards goals. Completed pre-TAVR assessment during this session. Noted mild SOB and wheezing and occasionally reaching for hand rail, however, pt reports this was because of foot pain. Required supervision for safety during mobility tasks. Current recommendations appropriate. Will continue to follow acutely.    Follow Up Recommendations  No PT follow up     Equipment Recommendations  None recommended by PT    Recommendations for Other Services       Precautions / Restrictions Precautions Precautions: Fall Restrictions Weight Bearing Restrictions: No    Mobility  Bed Mobility Overal bed mobility: Modified Independent                  Transfers Overall transfer level: Modified independent                  Ambulation/Gait Ambulation/Gait assistance: Supervision Gait Distance (Feet): 500 Feet Assistive device: None Gait Pattern/deviations: Step-through pattern;Decreased step length - right;Decreased step length - left;Drifts right/left;Narrow base of support Gait velocity: Decreased   General Gait Details: Mild unsteadiness. Occasionally reaching for hand rail during 6 min walk test, but pt reports this is because of foot pain. Wheezing noted, with mild SOB.   Stairs             Wheelchair Mobility    Modified Rankin (Stroke Patients Only)       Balance Overall balance assessment: Needs assistance Sitting-balance support: No upper extremity supported;Feet supported Sitting balance-Leahy Scale: Normal     Standing balance support: No upper  extremity supported;Single extremity supported Standing balance-Leahy Scale: Fair                              Cognition Arousal/Alertness: Awake/alert Behavior During Therapy: WFL for tasks assessed/performed Overall Cognitive Status: Within Functional Limits for tasks assessed                                        Exercises      General Comments        Pertinent Vitals/Pain Pain Assessment: Faces Faces Pain Scale: Hurts a little bit Pain Location: bilateral feet Pain Descriptors / Indicators: Aching Pain Intervention(s): Limited activity within patient's tolerance;Monitored during session;Repositioned    Home Living                      Prior Function            PT Goals (current goals can now be found in the care plan section) Acute Rehab PT Goals Patient Stated Goal: to have this valve replaced, return home PT Goal Formulation: With patient Time For Goal Achievement: 07/06/20 Potential to Achieve Goals: Good Progress towards PT goals: Progressing toward goals    Frequency    Min 3X/week      PT Plan Current plan remains appropriate    Co-evaluation              AM-PAC  PT "6 Clicks" Mobility   Outcome Measure  Help needed turning from your back to your side while in a flat bed without using bedrails?: None Help needed moving from lying on your back to sitting on the side of a flat bed without using bedrails?: None Help needed moving to and from a bed to a chair (including a wheelchair)?: A Little Help needed standing up from a chair using your arms (e.g., wheelchair or bedside chair)?: None Help needed to walk in hospital room?: A Little Help needed climbing 3-5 steps with a railing? : A Little 6 Click Score: 21    End of Session   Activity Tolerance: Patient tolerated treatment well Patient left: in bed;with call bell/phone within reach Nurse Communication: Mobility status PT Visit Diagnosis:  Unsteadiness on feet (R26.81);Muscle weakness (generalized) (M62.81);Difficulty in walking, not elsewhere classified (R26.2)     Time: 6295-2841 PT Time Calculation (min) (ACUTE ONLY): 26 min  Charges:  $Gait Training: 23-37 mins                     Cindee Salt, DPT  Acute Rehabilitation Services  Pager: [phone] Office: [phone]    Lehman Prom 06/23/2020, 3:53 PM

## 2020-06-23 NOTE — Consult Note (Addendum)
HEART AND [place] VALVE TEAM  Cardiology Consultation:   Patient ID: Briana Foster MRN: [id number]; DOB: [birth year]  Admit date: 06/20/2020 Date of Consult: 06/24/2020  Primary Care Provider: Wenda Low, MD [facility] Heart[facility] Cardiologist: Sinclair Grooms, MD  [facility] HeartCare Electrophysiologist:  Vickie Epley, MD    Patient Profile:   Briana Foster is a 75 y.o. female with a hx of obesity, DMT2, HTN, HLD, anemia, carotid stenosis (60-79% RICA stenosis), rheumatoid arthritis, GERD, permanent atrial fibrillation on Eliquis with difficult to control rates who is being seen today for the evaluation of severe AS at the request of Dr. Harrell Gave.  History of Present Illness:   [name] is retired from working from [employer]. She lives in the country outside of [place] with her daughter. She has full dentures and is fully vaccinated with the primary series for Covid 19. She takes care of all of her own ADLs and continues to drive and do her own errands. She does not have any issues with ambulation and can walk without the assistance of a walker or cane. She does not exercise regularly but does occasionally take 10-15 minute walks outside.  She is followed by Dr. Tamala Julian and Dr. Quentin Ore in the outpatient setting for aortic stenosis and afib. She is felt to have permanent atrial fibrillation having failed to maintain NSR on amiodarone. A rate control strategy with metoprolol and long acting diltiazem was recommended. Echo 05/11/20 showed EF 55-60%, mild LVH, moderate MAC with trivial MR, severe LFLG AS with mean gradient 29 mm Hg, peak gradient 31 mm hg, AVA 0.73 cm2, DVI 0.23, SVI 19. Of note, she was in rapid afib during this echo evaluation. She complained of exertional fatigue and dyspnea. She was referred to Dr. Angelena Form for structural heart evaluation and seen on 06/10/20 and noted to have afib with RVR during that visit and started back on oral  amio for better rate control. This was later discontinued due to nausea.  She underwent diagnostic L/RHC on 06/17/20 which showed widely patent coronary arteries (vessels tortuous but with no significant obstruction). She was also noted to be in afib with RVR with rates varying between 101-160 bpm in the setting of not taking her oral medications that morning. She was treated with several doses of IV metoprolol. Variable RR interval made it difficult to appropriately assess left ventricular gradient. "Eyeball" peak to peak gradient is 20 mmHg. AV coronary and mitral annular calcification noted on cine fluoroscopy. Cardiac output by Fick 4.82 L/min with index 1.99 L/min/m.  These findings are consistent with potential low-flow low gradient aortic stenosis as suspected.  Calculated aortic valve area 1.0 cm based upon a mean gradient of 14 mmHg. She was discharged home after her cath.  She then returned to the hospital on 5/22 via EMS for marked fatigue and generalized weakness. Work up in the ER was unremarkable except for continued afib with RVR and she was started back on amiodarone. CT angio to rule out dissection showed no acute abnormality or PE. There was nonspecific right hilar lymphadenopathy and prominent left hilar/mediastinal lymph nodes. Findings may be reactive in etiology. 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  EP was consulted for discission of AV node ablation + PPM placement. They recommended continued medical therapy with amiodarone since HRs had improved to 60-100 bpm. However, her HRs are now much better  controlled and she has even had some bradycardia into the 40s with 2.75 second pauses. Amio has now been discontinued once again.  Patient underwent cardiac CT on 5/23 which reveals anatomical characteristics consistent with aortic stenosis suitable for treatment by transcatheter  aortic valve replacement without any significant complicating features aside from some bulky calcification noted on the [place]. Carotid dopplers showed 5-63% LICA stenosis and 87-56% RICA stenosis. CT angio chest/abd/pelvis (done to rule out dissection) in ER reviewed and it appears that she has TF access. Repeat limited echo 06/22/20 showed EF 55%, mild LVH, mild RV dysfunction/enlargement, moderate MAC with mild MR and possible MS, moderate to severe TR and severe AS with a mean gradient of 38 mm hg, AVA 0.70cm2, DVI 0.22.  The patient is seen sitting in bed and visiting with pastor and wife. She is feeling better with HR control. She is very anxious about getting her valve done in order to have more energy and be less winded. She denies chest pain. She denies LE edema, orthopnea or PND. She denies dizziness or syncope. She is very anxious to get surgery done. Also, they have a cruise in early July that they have been putting off for three years due to Covid that she would like to go on.   Past Medical History:  Diagnosis Date  . Anemia   . Arthritis 07-20-12   hands  . Asthma   . Back pain   . Cardiomegaly   . Class 1 obesity with serious comorbidity and body mass index (BMI) of 31.0 to 31.9 in adult 03/26/2018  . Depression   . Essential hypertension 03/26/2018  . GERD (gastroesophageal reflux disease)    "comes and goes" - no meds currently  . Heart murmur    told as teenager  . Hyperlipidemia   . Joint pain   . Restless leg syndrome   . Rheumatoid arthritis ([place])   . Shortness of breath   . Tiredness   . Type 2 diabetes mellitus without complication, without long-term current use of insulin ([place]) 04/10/2018  . Vitamin D deficiency     Past Surgical History:  Procedure Laterality Date  . BUNIONECTOMY  20 yrs ago   bil feet  . BUNIONECTOMY  11/30/2011   Procedure: Lillard Anes;  Surgeon: Magnus Sinning, MD;  Location: [facility];  Service: Orthopedics;  Laterality: Bilateral;  RIGHT FOOT  EXCISION OF BUNIONETTE AND PARTIAL   PROXIMAL PHALANGECTOMY OF 5TH TOE LEFT FOOT FUNK BUNIONECTOMY,EXCISION OF BUNIONETTE   . CARDIOVERSION N/A 11/05/2019   Procedure: CARDIOVERSION;  Surgeon: Lelon Perla, MD;  Location: [facility] ENDOSCOPY;  Service: Cardiovascular;  Laterality: N/A;  . CARDIOVERSION N/A 12/05/2019   Procedure: CARDIOVERSION;  Surgeon: Elouise Munroe, MD;  Location: [place];  Service: Cardiovascular;  Laterality: N/A;  . COLONOSCOPY WITH PROPOFOL N/A 08/07/2012   Procedure: COLONOSCOPY WITH PROPOFOL;  Surgeon: Garlan Fair, MD;  Location: [facility] ENDOSCOPY;  Service: Endoscopy;  Laterality: N/A;  . MOUTH SURGERY     teeth extractions  . RIGHT/LEFT HEART CATH AND CORONARY ANGIOGRAPHY N/A 06/17/2020   Procedure: RIGHT/LEFT HEART CATH AND CORONARY ANGIOGRAPHY;  Surgeon: Belva Crome, MD;  Location: [place] CV LAB;  Service: Cardiovascular;  Laterality: N/A;  . TONSILLECTOMY    . TUBAL LIGATION    . WRIST SURGERY       Home Medications:  Prior to Admission medications   Medication Sig Start Date End Date Taking? Authorizing Provider  albuterol (VENTOLIN HFA) 108 (90 Base)  MCG/ACT inhaler Inhale 2 puffs into the lungs every 4 (four) hours as needed for wheezing or shortness of breath.   Yes [provider]  amiodarone (PACERONE) 200 MG tablet Take one tablet by mouth twice daily for 3 weeks, then decrease to one tablet by mouth daily. Patient taking differently: Take 200 mg by mouth daily. 06/11/20  Yes Belva Crome, MD  amLODipine (NORVASC) 5 MG tablet Take 5 mg by mouth daily in the afternoon.   Yes [provider]  apixaban (ELIQUIS) 5 MG TABS tablet Take 1 tablet (5 mg total) by mouth 2 (two) times daily. Resume at 8 PM tonight Patient taking differently: Take 5 mg by mouth 2 (two) times daily. 06/17/20  Yes Belva Crome, MD  cetirizine (ZYRTEC) 10 MG tablet Take 10 mg by mouth daily as needed for allergies.   Yes [provider]   diclofenac Sodium (VOLTAREN) 1 % GEL Apply 1 application topically 2 (two) times daily as needed (pain). 02/21/20  Yes [provider]  diltiazem (CARDIZEM CD) 180 MG 24 hr capsule Take 1 capsule (180 mg total) by mouth in the morning and at bedtime. 06/10/20  Yes Burnell Blanks, MD  metFORMIN (GLUCOPHAGE-XR) 750 MG 24 hr tablet Take 750 mg by mouth daily in the afternoon. 01/08/20  Yes [provider]  metoprolol tartrate (LOPRESSOR) 50 MG tablet Take 50 mg by mouth daily.   Yes [provider]  Multiple Vitamins-Minerals (MULTIVITAMIN WITH MINERALS) tablet Take 1 tablet by mouth daily.   Yes [provider]  Naphazoline-Pheniramine (ALLERGY EYE OP) Place 1 drop into both eyes daily as needed (red/itchy eyes).   Yes [provider]  pantoprazole (PROTONIX) 40 MG tablet Take 40 mg by mouth daily as needed (heartburn).   Yes [provider]  rOPINIRole (REQUIP) 4 MG tablet Take 4 mg by mouth at bedtime.   Yes [provider]  rosuvastatin (CRESTOR) 20 MG tablet Take 20 mg by mouth daily in the afternoon.   Yes [provider]  vitamin C (ASCORBIC ACID) 500 MG tablet Take 500 mg by mouth daily.   Yes [provider]    Inpatient Medications: Scheduled Meds: . apixaban  5 mg Oral BID  . diltiazem  180 mg Oral BID  . insulin aspart  0-15 Units Subcutaneous TID WC  . metoprolol tartrate  50 mg Oral Q12H  . rOPINIRole  4 mg Oral QHS  . rosuvastatin  20 mg Oral Q1500   Continuous Infusions:  PRN Meds: acetaminophen, albuterol, ondansetron (ZOFRAN) IV, pantoprazole  Allergies:    Allergies  Allergen Reactions  . Bee Venom Shortness Of Breath and Rash  . Apricot Flavor Swelling    Eyes swell shut  . Atorvastatin Other (See Comments)  . Wasp Venom Other (See Comments)    Social History:   Social History   Socioeconomic History  . Marital status: Widowed    Spouse name: Not on file  . Number of  children: 2  . Years of education: Not on file  . Highest education level: Not on file  Occupational History  . Occupation: Retired-worked at [employer]  . Smoking status: Never Smoker  . Smokeless tobacco: Never Used  Substance and Sexual Activity  . Alcohol use: No  . Drug use: No  . Sexual activity: Not Currently  Other Topics Concern  . Not on file  Social History Narrative  . Not on file   Social Determinants of  Health   Financial Resource Strain: Not on file  Food Insecurity: Not on file  Transportation Needs: Not on file  Physical Activity: Not on file  Stress: Not on file  Social Connections: Not on file  Intimate Partner Violence: Not on file    Family History:    Family History  Problem Relation Age of Onset  . Heart disease Mother   . Stroke Mother   . Cancer Father        Prostate     ROS:  Please see the history of present illness.  All other ROS reviewed and negative.     Physical Exam/Data:   Vitals:   06/23/20 1940 06/23/20 2320 06/24/20 0349 06/24/20 0757  BP: (!) 133/104 (!) 145/97 (!) 120/92 (!) 123/92  Pulse: 74 93 100 87  Resp: _0 Temp: 98.6 F (37 C) 98 F (36.7 C) 98 F (36.7 C) 97.9 F (36.6 C)  TempSrc: Oral Oral Oral Oral  SpO2: 92% 90% 92% 94%  Weight:   96 kg   Height:        Intake/Output Summary (Last 24 hours) at 06/24/2020 0944 Last data filed at 06/23/2020 1407 Gross per 24 hour  Intake 240 ml  Output --  Net 240 ml   Last 3 Weights 06/24/2020 06/23/2020 06/22/2020  Weight (lbs) 211 lb 11.2 oz 213 lb 10 oz 212 lb 1.3 oz  Weight (kg) 96.026 kg 96.9 kg 96.2 kg     Body mass index is 32.19 kg/m.  General:  Well nourished, well developed, in no acute distress, obese HEENT: normal Lymph: no adenopathy Neck: no JVD Endocrine:  No thryomegaly Cardiac:  normal S1, S2; RRR; 3/6 SEM heard best at LLSB Lungs:  clear to auscultation bilaterally, no wheezing, rhonchi or rales  Abd: soft, nontender, no  hepatomegaly  Ext: no edema Musculoskeletal:  No deformities, BUE and BLE strength normal and equal Skin: warm and dry  Neuro:  CNs 2-12 intact, no focal abnormalities noted Psych:  Normal affect   EKG:  The EKG was personally reviewed and demonstrates: afib with HR 95 Telemetry:  Telemetry was personally reviewed and demonstrates:  afib with HRs 40-60s with frequent short pauses, longest 2.75 seconds.  Relevant CV Studies: Echo 05/11/20: 1. Left ventricular ejection fraction, by estimation, is 55 to 60%. The  left ventricle has normal function. The left ventricle has no regional  wall motion abnormalities. There is mild left ventricular hypertrophy.  Left ventricular diastolic parameters  are indeterminate.  2. Right ventricular systolic function is normal. The right ventricular  size is normal. There is normal pulmonary artery systolic pressure. The  estimated right ventricular systolic pressure is 09.6 mmHg.  3. Left atrial size was moderately dilated.  4. Right atrial size was moderately dilated.  5. The mitral valve is degenerative. Trivial mitral valve regurgitation.  Moderate mitral annular calcification.  6. The aortic valve is tricuspid. Aortic valve regurgitation is trivial.  Possible paradoxical low flow/low gradient severe aortic valve stenosis,  however patient is in atrial fibrillation with RVR so would repeat echo  when rate is better-controlled.  Aortic valve area, by VTI measures 0.73 cm. Aortic valve mean gradient  measures 29.0 mmHg.  7. The inferior vena cava is normal in size with greater than 50%  respiratory variability, suggesting right atrial pressure of 3 mmHg.  8. Atrial fibrillation with RVR.   FINDINGS  Left Ventricle: Left ventricular ejection fraction, by estimation, is 55  to 60%.  The left ventricle has normal function. The left ventricle has no  regional wall motion abnormalities. The left ventricular internal cavity  size was normal in  size. There is  mild left ventricular hypertrophy. Left ventricular diastolic parameters  are indeterminate.   Right Ventricle: The right ventricular size is normal. No increase in  right ventricular wall thickness. Right ventricular systolic function is  normal. There is normal pulmonary artery systolic pressure. The tricuspid  regurgitant velocity is 2.58 m/s, and  with an assumed right atrial pressure of 3 mmHg, the estimated right  ventricular systolic pressure is 41.7 mmHg.   Left Atrium: Left atrial size was moderately dilated.   Right Atrium: Right atrial size was moderately dilated.   Pericardium: There is no evidence of pericardial effusion.   Mitral Valve: The mitral valve is degenerative in appearance. There is  moderate calcification of the mitral valve leaflet(s). Moderate mitral  annular calcification. Trivial mitral valve regurgitation.   Tricuspid Valve: The tricuspid valve is normal in structure. Tricuspid  valve regurgitation is mild.   Aortic Valve: The aortic valve is tricuspid. Aortic valve regurgitation is  trivial. Severe aortic stenosis is present. Aortic valve mean gradient  measures 29.0 mmHg. Aortic valve peak gradient measures 31.3 mmHg. Aortic  valve area, by VTI measures 0.73  cm.   Pulmonic Valve: The pulmonic valve was normal in structure. Pulmonic valve  regurgitation is not visualized.   Aorta: The aortic root is normal in size and structure.   Venous: The inferior vena cava is normal in size with greater than 50%  respiratory variability, suggesting right atrial pressure of 3 mmHg.   IAS/Shunts: No atrial level shunt detected by color flow Doppler.     LEFT VENTRICLE  PLAX 2D  LVIDd:     4.30 cm  LVIDs:     3.30 cm  LV PW:     1.20 cm  LV IVS:    1.40 cm  LVOT diam:   2.00 cm  LV SV:     39  LV SV Index:  19  LVOT Area:   3.14 cm     RIGHT VENTRICLE      IVC  RVSP:      29.6 mmHg IVC  diam: 1.70 cm   LEFT ATRIUM       Index    RIGHT ATRIUM      Index  LA diam:    3.90 cm 1.90 cm/m RA Pressure: 3.00 mmHg  LA Vol (A2C):  106.0 ml 51.66 ml/m RA Area:   18.70 cm  LA Vol (A4C):  79.7 ml 38.84 ml/m RA Volume:  55.40 ml 27.00 ml/m  LA Biplane Vol: 92.3 ml 44.98 ml/m  AORTIC VALVE  AV Area (Vmax):  0.79 cm  AV Area (Vmean):  0.75 cm  AV Area (VTI):   0.73 cm  AV Vmax:      279.60 cm/s  AV Vmean:     206.600 cm/s  AV VTI:      0.536 m  AV Peak Grad:   31.3 mmHg  AV Mean Grad:   29.0 mmHg  LVOT Vmax:     69.88 cm/s  LVOT Vmean:    49.020 cm/s  LVOT VTI:     0.124 m  LVOT/AV VTI ratio: 0.23    AORTA  Ao Root diam: 3.10 cm  Ao Asc diam: 3.20 cm   MV E velocity: 157.40 cm/s TRICUSPID VALVE  TR Peak grad:  26.6 mmHg               TR Vmax:    258.00 cm/s               Estimated RAP: 3.00 mmHg               RVSP:      29.6 mmHg                 SHUNTS               Systemic VTI: 0.12 m               Systemic Diam: 2.00 cm ________________________   06/17/20 RIGHT/LEFT HEART CATH AND CORONARY ANGIOGRAPHY  Conclusion  Very poor rate control related to patient not taking medications.  Atrial fibrillation with RVR varying between 101 160 bpm.  Therefore, the patient was treated with IV metoprolol receiving 15 mg and 5 mg doses over 30 minutes before proceeding.  This decrease the heart rate into the 90 to 115 bpm range.  Widely patent coronary arteries.  Vessels are tortuous but have no significant obstruction.  Normal pulmonary artery pressures with mean pulmonary wedge 23 mmHg (influenced by rate).  Variable RR interval made it difficult to appropriately assess left ventricular gradient.  "Eyeball" peak to peak gradient is 20 mmHg.  AV coronary and mitral  annular calcification noted on cine fluoroscopy.  Cardiac output by Fick 4.82 L/min with index 1.99 L/min/m.  These findings are consistent with potential low-flow low gradient aortic stenosis as suspected.  Calculated aortic valve area 1.0 cm based upon a mean gradient of 14 mmHg.  RECOMMENDATIONS:   Back to Dr. Angelena Form for CT imaging and referral to cardiac surgery for second opinion concerning management with TAVR versus SAVR.  ______________________  Carotid dopplers 06/22/20 Summary:  Right Carotid: Velocities in the right ICA are consistent with a 60-79%         stenosis.   Left Carotid: Velocities in the left ICA are consistent with a 1-39%  stenosis.   Vertebrals: Bilateral vertebral arteries demonstrate antegrade flow.   *See table(s) above for measurements and observations.    __________________________  Echo limited 06/23/20 IMPRESSIONS  1. Left ventricular ejection fraction, by estimation, is 55%. The left  ventricle has no regional wall motion abnormalities. There is mild left  ventricular hypertrophy. Left ventricular diastolic parameters are  indeterminate.  2. Right ventricular systolic function is mildly reduced. The right  ventricular size is mildly enlarged. Peak RV-RA gradient 26 mmHg.  3. The mitral valve is abnormal. Mild mitral valve regurgitation.  Moderate mitral annular and valvular calcification. Visually, there  appears to be mitral stenosis but gradient was not measured. Would  consider additional images to assess degree of  mitral stenosis.  4. Tricuspid valve regurgitation is moderate to severe.  5. The aortic valve is tricuspid. Aortic valve regurgitation is not  visualized. Severe aortic valve stenosis. Aortic valve area, by VTI  measures 0.70 cm. Aortic valve mean gradient measures 38.0 mmHg.  6. The IVC was not visualized.   FINDINGS  Left Ventricle: Left ventricular ejection fraction, by estimation, is  55%. The  left ventricle has no regional wall motion abnormalities. The  left ventricular internal cavity size was normal in size. There is mild  left ventricular hypertrophy. Left  ventricular diastolic parameters are indeterminate.   Right Ventricle: The right ventricular size is mildly enlarged. No  increase in  right ventricular wall thickness. Right ventricular systolic  function is mildly reduced.   Pericardium: There is no evidence of pericardial effusion.   Mitral Valve: The mitral valve is abnormal. There is moderate  calcification of the mitral valve leaflet(s). Moderate mitral annular  calcification. Mild mitral valve regurgitation.   Tricuspid Valve: The tricuspid valve is normal in structure. Tricuspid  valve regurgitation is moderate to severe.   Aortic Valve: The aortic valve is tricuspid. Aortic valve regurgitation is  not visualized. Severe aortic stenosis is present. Aortic valve mean  gradient measures 38.0 mmHg. Aortic valve peak gradient measures 58.1  mmHg. Aortic valve area, by VTI measures  0.70 cm.   Pulmonic Valve: The pulmonic valve was normal in structure. Pulmonic valve  regurgitation is trivial.   Aorta: The aortic root is normal in size and structure.   Venous: The inferior vena cava was not well visualized.   LEFT VENTRICLE  PLAX 2D  LVIDd:     4.10 cm  LVIDs:     3.10 cm  LV PW:     1.40 cm  LV IVS:    1.00 cm  LVOT diam:   2.00 cm  LV SV:     64  LV SV Index:  30  LVOT Area:   3.14 cm     LEFT ATRIUM     Index  LA diam:  4.50 cm 2.15 cm/m  AORTIC VALVE  AV Area (Vmax):  0.75 cm  AV Area (Vmean):  0.71 cm  AV Area (VTI):   0.70 cm  AV Vmax:      381.20 cm/s  AV Vmean:     273.000 cm/s  AV VTI:      0.910 m  AV Peak Grad:   58.1 mmHg  AV Mean Grad:   38.0 mmHg  LVOT Vmax:     90.60 cm/s  LVOT Vmean:    61.900 cm/s  LVOT VTI:     0.203 m  LVOT/AV VTI ratio: 0.22     AORTA  Ao Root diam: 2.80 cm  Ao Asc diam: 3.20 cm   TRICUSPID VALVE  TR Peak grad:  26.6 mmHg  TR Vmax:    258.00 cm/s    SHUNTS  Systemic VTI: 0.20 m  Systemic Diam: 2.00 cm   ______________________________  CTA chest/abd/pelvis (for dissection) 06/21/20 IMPRESSION: 1. No acute aortic abnormality. Aortic Atherosclerosis (ICD10-I70.0) including four-vessel coronary artery calcifications and moderate mitral annular calcifications. 2. No central or segmental pulmonary embolus. 3. Nonspecific right hilar lymphadenopathy and prominent left hilar/mediastinal lymph nodes. Findings may be reactive in etiology. Recommend attention on follow-up. 4. 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 Correlation with prior cross-sectional imaging would be of value. Recommend attention on follow-up. 5. Mild cardiomegaly with question of patent foramen ovale. 6. Scattered colonic diverticulosis with no acute diverticulitis.  ___________________________  Cardiac CT 06/22/20 IMPRESSION: 1. 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 Bulky calcification noted on the [place].  2. Annular measurements appropriate for 23 mm [place] 3 TAVR (374 mm2).  3. No significant annular or subannular calcifications.  4. Sufficient coronary to annulus distance.  5. Optimal Fluoroscopic Angle for Delivery: LAO 10 CRA 21  6. There is mixing artifact in the LAA, cannot exclude thrombus.  7. The mitral valve is degenerative with moderate to severe annular calcification.  Laboratory Data:  High Sensitivity Troponin:   Recent Labs  Lab 06/21/20 0002 06/21/20 0427  TROPONINIHS 8 7  Chemistry Recent Labs  Lab 06/21/20 0002 06/21/20 0218 06/22/20 0239  NA 138  --  137  K 6.0* 3.7 4.0  CL 107  --  106  CO2 23  --  25  GLUCOSE 152*  --  154*  BUN 16  --  16  CREATININE 0.99  --  0.99  CALCIUM 9.4  --  9.2  GFRNONAA >60  --   >60  ANIONGAP 8  --  6    Recent Labs  Lab 06/21/20 0002  PROT 6.9  ALBUMIN 3.9  AST 63*  ALT 42  ALKPHOS 67  BILITOT 1.5*   Hematology Recent Labs  Lab 06/21/20 0002 06/22/20 0239  WBC 10.1 8.0  RBC 3.94 3.67*  HGB 12.5 11.3*  HCT 38.3 35.1*  MCV 97.2 95.6  MCH 31.7 30.8  MCHC 32.6 32.2  RDW 14.1 13.8  PLT 160 200   BNP Recent Labs  Lab 06/21/20 1648  BNP 250.5*    DDimer No results for input(s): DDIMER in the last 168 hours.   Radiology/Studies:  CT CORONARY MORPH W/CTA COR W/SCORE W/CA W/CM &/OR WO/CM  Addendum Date: 06/22/2020   ADDENDUM REPORT: 06/22/2020 19:07 CLINICAL DATA:  Severe Aortic Stenosis. EXAM: Cardiac TAVR CT TECHNIQUE: A non-contrast, gated CT scan was obtained with axial slices of 3 mm through the heart for aortic valve calcium scoring. A 120 kV retrospective, gated, contrast cardiac scan was obtained. Gantry rotation speed was 250 msecs and collimation was 0.6 mm. Nitroglycerin was not given. The 3D data set was reconstructed in 5% intervals of the 0-95% of the R-R cycle. Systolic and diastolic phases were analyzed on a dedicated workstation using MPR, MIP, and VRT modes. The patient received 100 cc of contrast. FINDINGS: Image quality: Excellent. Noise artifact is: Limited. Valve Morphology: The aortic valve is tricuspid and severely calcified. There is bulky calcification of the [place] with partial fusion of the NCC/LCC. The leaflets demonstrate severely restricted leaflet motion in systole. Aortic Valve Calcium score: 1083 Aortic annular dimension: Phase assessed: 25% Annular area: 374 mm2 Annular perimeter: 70.7 mm Max diameter: 25.5 mm Min diameter: 18.3 mm Annular and subannular calcification: None. Optimal coplanar projection: LAO 10 CRA 21 Coronary Artery Height above Annulus: Left Main: 12.5 mm Right Coronary: 15.2 mm Sinus of Valsalva Measurements: Non-coronary: 30 mm Right-coronary: 28 mm Left-coronary: 30 mm Sinus of Valsalva Height: Non-coronary:  21.9 mm Right-coronary: 21.5 mm Left-coronary: 18.0 mm Sinotubular Junction: 25 mm Ascending Thoracic Aorta: 31 mm Coronary Arteries: Normal coronary origin. Right dominance. The study was performed without use of NTG and is insufficient for plaque evaluation. Please refer to recent cardiac catheterization for coronary assessment. Cardiac Morphology: Right Atrium: Right atrial size is dilated. Right Ventricle: The right ventricular cavity is within normal limits. Left Atrium: Left atrial size is dilated. There is mixing artifact in the LAA, cannot exclude thrombus. Left Ventricle: The ventricular cavity size is within normal limits. There are no stigmata of prior infarction. There is no abnormal filling defect. Normal left ventricular function, EF=59%. No regional wall motion abnormalities. Pulmonary arteries: Normal in size without proximal filling defect. Pulmonary veins: Normal pulmonary venous drainage. Pericardium: Normal thickness with no significant effusion or calcium present. Mitral Valve: The mitral valve is degenerative with moderate to severe annular calcification. Extra-cardiac findings: See attached radiology report for non-cardiac structures. IMPRESSION: 1. 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 Bulky calcification noted on the [place]. 2. Annular measurements appropriate for 23 mm Sapien 3  TAVR (374 mm2). 3. No significant annular or subannular calcifications. 4. Sufficient coronary to annulus distance. 5. Optimal Fluoroscopic Angle for Delivery: LAO 10 CRA 21 6. There is mixing artifact in the LAA, cannot exclude thrombus. 7. The mitral valve is degenerative with moderate to severe annular calcification. [place] T. Audie Box, MD Electronically Signed   By: Eleonore Chiquito   On: 06/22/2020 19:07   Result Date: 06/22/2020 EXAM: OVER-READ INTERPRETATION  CT CHEST The following report is an over-read performed by radiologist Dr. Vinnie Langton of [facility], [place] on 06/22/2020. This  over-read does not include interpretation of cardiac or coronary anatomy or pathology. The coronary calcium score/coronary CTA interpretation by the cardiologist is attached. COMPARISON:  None. 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 Within the visualized portions of the thorax there are no suspicious appearing pulmonary nodules or masses, there is no acute consolidative airspace disease, no pleural effusions, no pneumothorax and no lymphadenopathy. Visualized portions of the upper abdomen are unremarkable. There are no aggressive appearing lytic or blastic lesions noted in the visualized portions of the skeleton. IMPRESSION: 1. 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 Electronically Signed: By: Vinnie Langton M.D. On: 06/22/2020 16:27   DG Chest Portable 1 View  Result Date: 06/21/2020 CLINICAL DATA:  Shortness of breath. EXAM: PORTABLE CHEST 1 VIEW COMPARISON:  December 07, 2016 FINDINGS: Mild, chronic appearing increased lung markings are seen without evidence of focal consolidation, pleural effusion or pneumothorax. Very mild peribronchial thickening is noted within the perihilar regions, bilaterally. The cardiac silhouette is mildly enlarged. The visualized skeletal structures are unremarkable. IMPRESSION: Mild bilateral peribronchial thickening without an acute infiltrate. Electronically Signed   By: Virgina Norfolk M.D.   On: 06/21/2020 00:18   CT Angio Chest/Abd/Pel for Dissection W and/or Wo Contrast  Result Date: 06/21/2020 CLINICAL DATA:  Abdominal pain, aortic dissection suspected. weakness and a-fib. heart cath performed Wednesday. EXAM: CT ANGIOGRAPHY CHEST, ABDOMEN AND PELVIS TECHNIQUE: Non-contrast CT of the chest was initially obtained. Multidetector CT imaging through the chest,  abdomen and pelvis was performed using the standard protocol during bolus administration of intravenous contrast. Multiplanar reconstructed images and MIPs were obtained and reviewed to evaluate the vascular anatomy. CONTRAST:  187m OMNIPAQUE IOHEXOL 350 MG/ML SOLN COMPARISON:  None. FINDINGS: CTA CHEST FINDINGS Cardiovascular: Satisfactory opacification of the pulmonary arteries to the segmental level. No evidence of pulmonary embolism. Satisfactory opacification of the aorta with no aneurysm or dissection. Mild cardiomegaly. Question patent foramen ovale. No pericardial effusion. Four-vessel coronary artery calcifications. Mitral annular calcifications at least moderate. Mediastinum/Nodes: Multiple borderline enlarged mediastinal lymph nodes: 0.9 cm right paratracheal (7:45). Prominent left hilar lymph nodes. Likely enlarged right hilar lymph node: 1.3 cm (7:67). No enlarged axillary lymph nodes. Thyroid gland, trachea, and esophagus demonstrate no significant findings. Lungs/Pleura: Bilateral lower lobe subsegmental atelectasis. Few scattered calcified and noncalcified pulmonary micronodules (6:34, levin). No pulmonary mass. No focal consolidation. No pleural effusion. No pneumothorax. 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 Diffuse bronchial wall thickening. Musculoskeletal: No chest wall abnormality. No suspicious lytic or blastic osseous lesions. No acute displaced fracture. Multilevel degenerative changes of the spine. Degenerative changes of bilateral shoulders. Review of the MIP images confirms the above findings. CTA ABDOMEN AND PELVIS FINDINGS VASCULAR Aorta: At least moderate atherosclerotic plaque. Normal caliber  aorta without aneurysm, dissection, vasculitis or significant stenosis. 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 Patent without evidence of aneurysm, dissection, vasculitis or  significant stenosis. SMA: Patent without evidence of aneurysm, dissection, vasculitis or significant stenosis. Renals: Atherosclerotic plaque. Both renal arteries are patent without evidence of aneurysm, dissection, vasculitis, fibromuscular dysplasia or significant stenosis. IMA: Patent without evidence of aneurysm, dissection, vasculitis or significant stenosis. Inflow: Patent without evidence of aneurysm, dissection, vasculitis or significant stenosis. Veins: No obvious venous abnormality within the limitations of this arterial phase study. Review of the MIP images confirms the above findings. NON-VASCULAR Hepatobiliary: No focal liver abnormality. No gallstones, gallbladder wall thickening, or pericholecystic fluid. No biliary dilatation. Pancreas: No focal lesion. Normal pancreatic contour. No surrounding inflammatory changes. No main pancreatic ductal dilatation. Spleen: Normal in size without focal abnormality. Adrenals/Urinary Tract: No adrenal nodule bilaterally. Bilateral kidneys enhance symmetrically. No hydronephrosis. No hydroureter. The urinary bladder is unremarkable. Stomach/Bowel: Stomach is within normal limits. No evidence of bowel wall thickening or dilatation. Scattered colonic diverticulosis. Appendix appears normal. Lymphatic: No lymphadenopathy. Reproductive: Uterus and bilateral adnexa are unremarkable. Other: No intraperitoneal free fluid. No intraperitoneal free gas. No organized fluid collection. Musculoskeletal: No abdominal wall hernia or abnormality. No suspicious lytic or blastic osseous lesions. No acute displaced fracture. Multilevel degenerative changes of the spine. Review of the MIP images confirms the above findings. IMPRESSION: 1. No acute aortic abnormality. Aortic Atherosclerosis (ICD10-I70.0) including four-vessel coronary artery calcifications and moderate mitral annular calcifications. 2. No central or segmental pulmonary embolus. 3. Nonspecific right hilar  lymphadenopathy and prominent left hilar/mediastinal lymph nodes. Findings may be reactive in etiology. Recommend attention on follow-up. 4. 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 Correlation with prior cross-sectional imaging would be of value. Recommend attention on follow-up. 5. Mild cardiomegaly with question of patent foramen ovale. 6. Scattered colonic diverticulosis with no acute diverticulitis. Electronically Signed   By: Iven Finn M.D.   On: 06/21/2020 04:21   VAS US CAROTID  Result Date: 06/22/2020 Carotid Arterial Duplex Study Patient Name:  ERIONA KINCHEN  Date of Exam:   06/22/2020 Medical Rec #: [id number]         Accession #:    [id number] Date of Birth: 1946/01/31        Patient Gender: F Patient Age:   74Y Exam Location:  [facility] Procedure:      VAS US CAROTID Referring Phys: [id number] [place] --------------------------------------------------------------------------------  Indications:       Severe aortic stenosis. Risk Factors:      Hypertension, hyperlipidemia, Diabetes. Comparison Study:  no prior Performing Technologist: Abram Sander RVS  Examination Guidelines: A complete evaluation includes B-mode imaging, spectral Doppler, color Doppler, and power Doppler as needed of all accessible portions of each vessel. Bilateral testing is considered an integral part of a complete examination. Limited examinations for reoccurring indications may be performed as noted.  Right Carotid Findings: +----------+--------+--------+--------+-------------------------+--------+           PSV cm/sEDV cm/sStenosisPlaque Description       Comments +----------+--------+--------+--------+-------------------------+--------+ CCA Prox  44      13              heterogenous                      +----------+--------+--------+--------+-------------------------+--------+ CCA Distal68      26  heterogenous and calcific          +----------+--------+--------+--------+-------------------------+--------+ ICA Prox  257     96      60-79%  heterogenous and calcific         +----------+--------+--------+--------+-------------------------+--------+ ICA Mid   168     78                                                +----------+--------+--------+--------+-------------------------+--------+ ICA Distal103     36                                                +----------+--------+--------+--------+-------------------------+--------+ ECA       86      17                                                +----------+--------+--------+--------+-------------------------+--------+ +----------+--------+-------+--------+-------------------+           PSV cm/sEDV cmsDescribeArm Pressure (mmHG) +----------+--------+-------+--------+-------------------+ Subclavian80                                         +----------+--------+-------+--------+-------------------+ +---------+--------+--+--------+--+---------+ VertebralPSV cm/s70EDV cm/s18Antegrade +---------+--------+--+--------+--+---------+  Left Carotid Findings: +----------+--------+--------+--------+------------------+--------+           PSV cm/sEDV cm/sStenosisPlaque DescriptionComments +----------+--------+--------+--------+------------------+--------+ CCA Prox  92      29              heterogenous               +----------+--------+--------+--------+------------------+--------+ CCA Distal77      18              heterogenous               +----------+--------+--------+--------+------------------+--------+ ICA Prox  50      19      1-39%   heterogenous               +----------+--------+--------+--------+------------------+--------+ ICA Distal52      21                                         +----------+--------+--------+--------+------------------+--------+ ECA       55      12                                          +----------+--------+--------+--------+------------------+--------+ +----------+--------+--------+--------+-------------------+           PSV cm/sEDV cm/sDescribeArm Pressure (mmHG) +----------+--------+--------+--------+-------------------+ [id number]                                          +----------+--------+--------+--------+-------------------+ +---------+--------+--+--------+--+---------+ VertebralPSV cm/s47EDV cm/s23Antegrade +---------+--------+--+--------+--+---------+   Summary: Right Carotid: Velocities in the right ICA are consistent with a 60-79%  stenosis. Left Carotid: Velocities in the left ICA are consistent with a 1-39% stenosis. Vertebrals: Bilateral vertebral arteries demonstrate antegrade flow. *See table(s) above for measurements and observations.  Electronically signed by Monica Martinez MD on 06/22/2020 at 3:36:32 PM.    Final    ECHOCARDIOGRAM LIMITED  Result Date: 06/22/2020    ECHOCARDIOGRAM LIMITED REPORT   Patient Name:   Briana Foster Date of Exam: 06/22/2020 Medical Rec #:  [id number]        Height:       68.0 in Accession #:    [id number]       Weight:       212.1 lb Date of Birth:  Nov 09, 1945       BSA:          2.095 m Patient Age:    5 years         BP:           121/95 mmHg Patient Gender: F                HR:           54 bpm. Exam Location:  Inpatient Procedure: Limited Echo, Cardiac Doppler and Limited Color Doppler Indications:    I35.0 Nonrheumatic aortic (valve) stenosis  History:        Patient has prior history of Echocardiogram examinations, most                 recent 05/11/2020. Cardiomegaly, Signs/Symptoms:Murmur; Risk                 Factors:Hypertension, Diabetes, Dyslipidemia and GERD.  Sonographer:    Jonelle Sidle Dance Referring Phys: [place]  1. Left ventricular ejection fraction, by estimation, is 55%. The left ventricle has no regional wall motion abnormalities. There is mild left ventricular  hypertrophy. Left ventricular diastolic parameters are indeterminate.  2. Right ventricular systolic function is mildly reduced. The right ventricular size is mildly enlarged. Peak RV-RA gradient 26 mmHg.  3. The mitral valve is abnormal. Mild mitral valve regurgitation. Moderate mitral annular and valvular calcification. Visually, there appears to be mitral stenosis but gradient was not measured. Would consider additional images to assess degree of mitral stenosis.  4. Tricuspid valve regurgitation is moderate to severe.  5. The aortic valve is tricuspid. Aortic valve regurgitation is not visualized. Severe aortic valve stenosis. Aortic valve area, by VTI measures 0.70 cm. Aortic valve mean gradient measures 38.0 mmHg.  6. The IVC was not visualized. FINDINGS  Left Ventricle: Left ventricular ejection fraction, by estimation, is 55%. The left ventricle has no regional wall motion abnormalities. The left ventricular internal cavity size was normal in size. There is mild left ventricular hypertrophy. Left ventricular diastolic parameters are indeterminate. Right Ventricle: The right ventricular size is mildly enlarged. No increase in right ventricular wall thickness. Right ventricular systolic function is mildly reduced. Pericardium: There is no evidence of pericardial effusion. Mitral Valve: The mitral valve is abnormal. There is moderate calcification of the mitral valve leaflet(s). Moderate mitral annular calcification. Mild mitral valve regurgitation. Tricuspid Valve: The tricuspid valve is normal in structure. Tricuspid valve regurgitation is moderate to severe. Aortic Valve: The aortic valve is tricuspid. Aortic valve regurgitation is not visualized. Severe aortic stenosis is present. Aortic valve mean gradient measures 38.0 mmHg. Aortic valve peak gradient measures 58.1 mmHg. Aortic valve area, by VTI measures  0.70 cm. Pulmonic Valve: The pulmonic valve was normal in structure. Pulmonic valve regurgitation  is  trivial. Aorta: The aortic root is normal in size and structure. Venous: The inferior vena cava was not well visualized. LEFT VENTRICLE PLAX 2D LVIDd:         4.10 cm LVIDs:         3.10 cm LV PW:         1.40 cm LV IVS:        1.00 cm LVOT diam:     2.00 cm LV SV:         64 LV SV Index:   30 LVOT Area:     3.14 cm  LEFT ATRIUM         Index LA diam:    4.50 cm 2.15 cm/m  AORTIC VALVE AV Area (Vmax):    0.75 cm AV Area (Vmean):   0.71 cm AV Area (VTI):     0.70 cm AV Vmax:           381.20 cm/s AV Vmean:          273.000 cm/s AV VTI:            0.910 m AV Peak Grad:      58.1 mmHg AV Mean Grad:      38.0 mmHg LVOT Vmax:         90.60 cm/s LVOT Vmean:        61.900 cm/s LVOT VTI:          0.203 m LVOT/AV VTI ratio: 0.22  AORTA Ao Root diam: 2.80 cm Ao Asc diam:  3.20 cm TRICUSPID VALVE TR Peak grad:   26.6 mmHg TR Vmax:        258.00 cm/s  SHUNTS Systemic VTI:  0.20 m Systemic Diam: 2.00 cm Loralie Champagne MD Electronically signed by Loralie Champagne MD Signature Date/Time: 06/22/2020/9:08:39 PM    Final     Assessment and Plan:   JANIE CAPP is a 75 y.o. female with symptoms of severe, stage D3 aortic stenosis with NYHA Class III symptoms, symptoms exacerbated with atrial fibrillation with RVR. I have reviewed the patient's echocardiogram from 05/11/20 which is notable for persevered LV systolic function and severe aortic stenosis with peak gradient of 3m hg and mean transvalvular gradient of 292mg. The patient's dimensionless index is 0.23 and calculated aortic valve area is 0.79 cm. This study was done while the pt was in afib with RVR. Repeat limited echo this admission actually showed much higher gradients with stage D1 severe AS: EF 55%, mild LVH, mild RV dysfunction/enlargement, moderate MAC with mild MR and possible MS, moderate to severe TR and severe AS with a mean gradient of 38 mm hg, AVA 0.70cm2, DVI 0.22.   She underwent diagnostic L/RHC on 06/17/20 which showed widely patent coronary  arteries (vessels tortuous but with no significant obstruction). She was also noted to be in afib with RVR with rates varying between 101-160 bpm in the setting of not taking her oral medications that morning. She was treated with several doses of IV metoprolol. Variable RR interval made it difficult to appropriately assess left ventricular gradient. "[place]" peak to peak gradient is 20 mmHg. AV coronary and mitral annular calcification noted on cine fluoroscopy. Cardiac output by Fick 4.82 L/min with index 1.99 L/min/m.  These findings are consistent with potential low-flow low gradient aortic stenosis as suspected.  Calculated aortic valve area 1.0 cm based upon a mean gradient of 14 mmHg.   Carotid dopplers showed 01-31-73%ICA stenosis and 6010-25%ICA stenosis. Cardiac gated CTA of  the heart reveals anatomical characteristics consistent with aortic stenosis suitable for treatment by transcatheter aortic valve replacement without any significant complicating features (aside from some bulky calcification noted on the [place]) and CTA of the aorta and iliac vessels demonstrate what appear to be adequate pelvic vascular access to facilitate a transfemoral approach (needs to be reviewed by MD).     I have reviewed the natural history of aortic stenosis with the patient. We have discussed the limitations of medical therapy and the poor prognosis associated with symptomatic aortic stenosis. We have reviewed potential treatment options, including palliative medical therapy, conventional surgical aortic valve replacement, and transcatheter aortic valve replacement. We discussed treatment options in the context of this patient's specific comorbid medical conditions.    The patient's predicted risk of mortality with conventional aortic valve replacement is 2.308% primarily based on age, obesity, afib, HTN, DM, carotid stenosis and moderate to severe TR. Other significant comorbid conditions include rheumatoid arthritis  and mild frailty (PT assessment rated her as a 4). The patient understands her options and says she she would adamantly refuse surgery if offered. I think she would have a difficult time recovering from conventional surgery given her above com morbidities and that TAVR would be a reasonable alternative.   Potential TAVR surgery dates include 5/31 or 6/21 depending on physician scheduling. Will discuss with Dr. Roxy Manns after he has seen the patient.   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  The patient's atrial fibrillation has been difficult to manage medically with fluctuations in heart rate consistent with tachybradycardia syndrome.  She has been evaluated by the EP service who feel that an attempt at catheter-based ablation is probably not warranted.  Consideration of AV node ablation and permanent pacemaker placement has not been recommended at this time.  The patient states that since she has been in the hospital and medications adjusted she is feeling better.  She describes stable symptoms of exertional shortness of breath that occur with more strenuous exertion, consistent with chronic diastolic congestive heart failure, New York Heart Association functional  class II.  Her daughter reports that intermittently at home she has episodes where her breathing gets much worse.  I personally reviewed the patient's recent transthoracic echocardiogram, diagnostic cardiac catheterization, and CT angiograms.  Echocardiogram reveals severe aortic stenosis.  Left ventricular ejection fraction is estimated 55 to 60%.  The aortic valve is trileaflet with severe thickening, calcification, and restricted leaflet mobility involving all 3 leaflets of the aortic valve.  Peak velocity across aortic valve range considerably but was measured at size 2.8 m/s corresponding to mean transvalvular gradient estimated 29 mmHg but aortic valve area calculated only 0.73 cm by VTI.  The DVI was notably quite low at 0.23 and stroke-volume index only 19.  Diagnostic cardiac catheterization reveals no significant coronary artery disease.  Calculated valve area by catheterization  was 1.0 cm with mean transvalvular gradient 14 mmHg.  Gated CT angiogram of the heart confirmed the presence of severe calcification of the aortic valve with severely restricted leaflet motion in systole.  The overall size of the aortic valve and aortic root is relatively small with annular area measured 374 mm.   CTA of the aorta and iliac vessels demonstrate what appears to be adequate pelvic vascular access to facilitate a transfemoral approach.  I agree the patient probably has stage D3 severe paradoxical low flow low gradient aortic stenosis and she would potentially benefit from aortic valve replacement.  However, I would be reluctant to consider this patient a candidate for conventional surgery because of her advanced age and comorbid medical problems with significant physical deconditioning.  Moreover, conventional surgery would clearly require aortic root enlargement or root replacement to avoid the development of patient prosthesis mismatch.  The patient would likely best be treated with transcatheter aortic valve  replacement.  The patient and her daughter were counseled at length regarding treatment alternatives for management of severe symptomatic aortic stenosis. Alternative approaches such as conventional aortic valve replacement, transcatheter aortic valve replacement, and continued medical therapy without intervention were compared and contrasted at length.  The risks associated with conventional surgical aortic valve replacement were discussed in detail, as were expectations for post-operative convalescence, and why I would be reluctant to consider this patient a candidate for conventional surgery.  Issues specific to transcatheter aortic valve replacement were discussed including questions about long term valve durability, the potential for paravalvular leak, possible increased risk of need for permanent pacemaker placement, and other technical complications related to the procedure itself.  Long-term prognosis with medical therapy was discussed. This discussion was placed in the context of the patient's own specific clinical presentation and past medical history.  All of their questions have been addressed.  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  We tentatively plan for surgery on Jun 30, 2020.  Following the decision to proceed with transcatheter aortic valve replacement, a discussion has been held regarding what types of management strategies would be attempted intraoperatively in the event of life-threatening complications, including whether or not the patient would be considered a candidate for the use of cardiopulmonary bypass and/or conversion to open sternotomy for attempted surgical intervention.  The patient specifically requests that should a potentially life-threatening complication develop we would attempt emergency median sternotomy and/or other aggressive surgical procedures.  The patient has been advised of a variety of complications that  might develop including but not limited to risks of death, stroke, paravalvular leak, aortic dissection or other major vascular complications, aortic annulus rupture, device embolization, cardiac rupture or perforation, mitral regurgitation, acute myocardial infarction, arrhythmia, heart block or bradycardia requiring permanent pacemaker placement, congestive heart failure, respiratory failure, renal failure, pneumonia, infection, other late complications related to structural valve deterioration or migration, or other complications that might ultimately cause a temporary or permanent loss of functional independence or other long term morbidity.  The patient provides full informed consent for the procedure as described and all questions were answered.    I spent in excess of 90 minutes during the conduct of this hospital encounter and >50% of this time involved direct face-to-face encounter with the patient for counseling and/or coordination of their care.     Rexene Alberts, MD 06/24/2020 3:53 PM

## 2020-06-23 NOTE — Progress Notes (Signed)
06/23/2020 PT TAVR Pre-Assessment  HPI: Briana Foster is a 75 y.o. female who presents with acute on chronic fatigue/weakness. Pt found to have afib and severe AS and is being worked up for TAVR. PMH includes pAF, anemia, HLD, RA, GERD, HTN, and DM2.(copied from Eval)  Clinical Impression Statement: Pt is a 75 y.o.female being assessed for pre-TAVR.  Pt reports symptoms of SOB, fatigue with activity.  Pt has 5/5 strength, normal ROM, and fair balance.  Pt ambulated 477.5 ft during the 6 minute walk test requiring no rest breaks with max HR of 92, lowest O2 sat 92, BP 132/83 during mobility.  5 meter walk test produced an average gait speed of 2.19 ft/sec which indicates low fall risk, however, pt right near cut off for being high fall risk.  RPE was 9 and dyspnea was 2 during mobility.  Pt was limited by fatigue, and foot pain.  Pt's frailty rating was 4 which is considered vulnerable and frail.  Pt would benefit from continued PT in the acute care setting due to the above listed deficits in balance, strength, ROM, endurance and activity tolerance.    General UE/LE Strength and ROM:  Strength (0-5/5) ROM (limited/full)  R UE 5/5 Full  L UE 5/5 Full  R LE 5/5 Full  L LE 5/5 Full     6 Minute Walk Test:   Total Distance Walked: 477.5 ft.    Did the pt need a rest break? No If yes, why? Pain:No; Fatigue:No; Dyspnea/O2 saturations: No Comments: Pt with slower gait speed. Reporting increased foot pain which caused pt to go at a slower speed as well.    Pre-Test Post-Test  BP 131/83 132/83  HR 71 79  O2 saturations (indicated RA or L/min [place]) 97 on RA  94  Modified Borg Dyspnea Scale (0 none-10 maximal) 0.5 2  RPE (6 very light-10 very hard) 6 9    5  Meter Walk Test:  Trial 1 7.67 seconds  Trial 2 7.56 seconds  Trial 3 7.22 seconds  3 Trial Average/Gait Speed 7.48 seconds/2.19 ft/sec (<1.8 ft/sec indicates high fall risk)  Comments: Pt right at cut off for being high fall  risk.  Clinical Frailty Scale (1 very fit - 9 terminally ill): 4 (>/= 3/9 is considered frail)  5/9, PT, DPT  Acute Rehabilitation Services  Pager: [phone] Office: [phone]

## 2020-06-23 NOTE — Telephone Encounter (Signed)
PT is requesting a callback from [place] she has some questions

## 2020-06-23 NOTE — Progress Notes (Signed)
Mobility Specialist: Progress Note   06/23/20 1122  Mobility  Activity Ambulated in hall  Level of Assistance Contact guard assist, steadying assist  Assistive Device None  Distance Ambulated (ft) 470 ft  Mobility Ambulated with assistance in hallway  Mobility Response Tolerated well  Mobility performed by Mobility specialist  $Mobility charge 1 Mobility   Pre-Mobility: 56 HR, 93% SpO2 During Mobility: 80 HR, 97% SpO2 Post-Mobility: 64 HR, 120/79 BP, 97% SpO2  Pt required minA with balance during ambulation using gait belt. Pt audibly "wheezing" while walking and said she has asthma, sats as seen above. Pt otherwise asx, back to bed after walk with NT present in room.   [facility] Briana Foster Mobility Specialist Mobility Specialist Phone: [phone]

## 2020-06-23 NOTE — Progress Notes (Signed)
While Pt was sleeping tonight, her SPO2 85-89% on room air. O2 NCL 2 LPM given, then SPO2 92-97%.  Atrial fib on monitor, controlled HR 67-78. BP within normal limits, remains afebrile. Alert and oriented x 4, ambulated independently. No immediate distress. We continue to monitor.   Ozzie Remmers,RN

## 2020-06-23 NOTE — Progress Notes (Addendum)
Progress Note  Patient Name: Briana Foster Date of Encounter: 06/23/2020  [facility] Cardiologist: Lyn Records III, MD   Subjective  No chest pain no SOB no lightheadedness or dizziness. Pt very concerned about her HR which was reason for admit. She would like Dr. Michaelle Copas opinion - he is in hospital today.  I have sent him a text.  She would prefer to have surgery now but I talked to structural heart team and they are unable to do this.  They will talk to her.    Inpatient Medications    Scheduled Meds:  apixaban  5 mg Oral BID   diltiazem  180 mg Oral BID   insulin aspart  0-15 Units Subcutaneous TID WC   metoprolol tartrate  50 mg Oral Q6H   rOPINIRole  4 mg Oral QHS   rosuvastatin  20 mg Oral Q1500   Continuous Infusions:   PRN Meds: acetaminophen, albuterol, ondansetron (ZOFRAN) IV, pantoprazole   Vital Signs    Vitals:   06/23/20 0410 06/23/20 0540 06/23/20 0650 06/23/20 0800  BP: (!) 148/93   96/78  Pulse: 78 (!) 46 83 (!) 51  Resp:    17  Temp:    97.7 F (36.5 C)  TempSrc:    Oral  SpO2: 97% 98% 97% 91%  Weight:      Height:        Intake/Output Summary (Last 24 hours) at 06/23/2020 0916 Last data filed at 06/22/2020 2100 Gross per 24 hour  Intake 970 ml  Output --  Net 970 ml   Last 3 Weights 06/23/2020 06/22/2020 06/21/2020  Weight (lbs) 213 lb 10 oz 212 lb 1.3 oz 211 lb 10.3 oz  Weight (kg) 96.9 kg 96.2 kg 96 kg      Telemetry    Atrial fib with rate from 39 for 6 sec to 70s  - Personally Reviewed  ECG    No new - Personally Reviewed  Physical Exam   GEN: No acute distress.   Neck: No JVD Cardiac: RRR, 3-4/6 murmurs, no rubs, or gallops.  Respiratory: Clear to auscultation bilaterally. GI: Soft, nontender, non-distended  MS: No edema; No deformity. Neuro:  Nonfocal  Psych: Normal affect   Labs    High Sensitivity Troponin:   Recent Labs  Lab 06/21/20 0002 06/21/20 0427  TROPONINIHS 8 7      Chemistry Recent Labs   Lab 06/17/20 0922 06/21/20 0002 06/21/20 0218 06/22/20 0239  NA 142 138  --  137  K 3.5 6.0* 3.7 4.0  CL  --  107  --  106  CO2  --  23  --  25  GLUCOSE  --  152*  --  154*  BUN  --  16  --  16  CREATININE  --  0.99  --  0.99  CALCIUM  --  9.4  --  9.2  PROT  --  6.9  --   --   ALBUMIN  --  3.9  --   --   AST  --  63*  --   --   ALT  --  42  --   --   ALKPHOS  --  67  --   --   BILITOT  --  1.5*  --   --   GFRNONAA  --  >60  --  >60  ANIONGAP  --  8  --  6     Hematology Recent Labs  Lab 06/17/20  7035 06/21/20 0002 06/22/20 0239  WBC  --  10.1 8.0  RBC  --  3.94 3.67*  HGB 11.6* 12.5 11.3*  HCT 34.0* 38.3 35.1*  MCV  --  97.2 95.6  MCH  --  31.7 30.8  MCHC  --  32.6 32.2  RDW  --  14.1 13.8  PLT  --  160 200    BNP Recent Labs  Lab 06/21/20 1648  BNP 250.5*     DDimer No results for input(s): DDIMER in the last 168 hours.   Radiology    CT CORONARY MORPH W/CTA COR W/SCORE W/CA W/CM &/OR WO/CM  Addendum Date: 06/22/2020   ADDENDUM REPORT: 06/22/2020 19:07 CLINICAL DATA:  Severe Aortic Stenosis. EXAM: Cardiac TAVR CT TECHNIQUE: A non-contrast, gated CT scan was obtained with axial slices of 3 mm through the heart for aortic valve calcium scoring. A 120 kV retrospective, gated, contrast cardiac scan was obtained. Gantry rotation speed was 250 msecs and collimation was 0.6 mm. Nitroglycerin was not given. The 3D data set was reconstructed in 5% intervals of the 0-95% of the R-R cycle. Systolic and diastolic phases were analyzed on a dedicated workstation using MPR, MIP, and VRT modes. The patient received 100 cc of contrast. FINDINGS: Image quality: Excellent. Noise artifact is: Limited. Valve Morphology: The aortic valve is tricuspid and severely calcified. There is bulky calcification of the NCC with partial fusion of the NCC/LCC. The leaflets demonstrate severely restricted leaflet motion in systole. Aortic Valve Calcium score: 1083 Aortic annular dimension:  Phase assessed: 25% Annular area: 374 mm2 Annular perimeter: 70.7 mm Max diameter: 25.5 mm Min diameter: 18.3 mm Annular and subannular calcification: None. Optimal coplanar projection: LAO 10 CRA 21 Coronary Artery Height above Annulus: Left Main: 12.5 mm Right Coronary: 15.2 mm Sinus of Valsalva Measurements: Non-coronary: 30 mm Right-coronary: 28 mm Left-coronary: 30 mm Sinus of Valsalva Height: Non-coronary: 21.9 mm Right-coronary: 21.5 mm Left-coronary: 18.0 mm Sinotubular Junction: 25 mm Ascending Thoracic Aorta: 31 mm Coronary Arteries: Normal coronary origin. Right dominance. The study was performed without use of NTG and is insufficient for plaque evaluation. Please refer to recent cardiac catheterization for coronary assessment. Cardiac Morphology: Right Atrium: Right atrial size is dilated. Right Ventricle: The right ventricular cavity is within normal limits. Left Atrium: Left atrial size is dilated. There is mixing artifact in the LAA, cannot exclude thrombus. Left Ventricle: The ventricular cavity size is within normal limits. There are no stigmata of prior infarction. There is no abnormal filling defect. Normal left ventricular function, EF=59%. No regional wall motion abnormalities. Pulmonary arteries: Normal in size without proximal filling defect. Pulmonary veins: Normal pulmonary venous drainage. Pericardium: Normal thickness with no significant effusion or calcium present. Mitral Valve: The mitral valve is degenerative with moderate to severe annular calcification. Extra-cardiac findings: See attached radiology report for non-cardiac structures. IMPRESSION: 1. 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 Bulky calcification noted on the NCC. 2. Annular measurements appropriate for 23 mm Sapien 3 TAVR (374 mm2). 3. No significant annular or subannular calcifications. 4. Sufficient coronary to annulus distance. 5. Optimal Fluoroscopic Angle for Delivery: LAO 10 CRA 21 6. There is  mixing artifact in the LAA, cannot exclude thrombus. 7. The mitral valve is degenerative with moderate to severe annular calcification. Gerri Spore T. Flora Lipps, MD Electronically Signed   By: Lennie Odor   On: 06/22/2020 19:07   Result Date: 06/22/2020 EXAM: OVER-READ INTERPRETATION  CT CHEST The following report is an over-read performed by  radiologist Dr. Trudie Reed of [facility] on 06/22/2020. This over-read does not include interpretation of cardiac or coronary anatomy or pathology. The coronary calcium score/coronary CTA interpretation by the cardiologist is attached. COMPARISON:  None. 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 Within the visualized portions of the thorax there are no suspicious appearing pulmonary nodules or masses, there is no acute consolidative airspace disease, no pleural effusions, no pneumothorax and no lymphadenopathy. Visualized portions of the upper abdomen are unremarkable. There are no aggressive appearing lytic or blastic lesions noted in the visualized portions of the skeleton. IMPRESSION: 1. 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 Electronically Signed: By: Trudie Reed M.D. On: 06/22/2020 16:27   VAS US CAROTID  Result Date: 06/22/2020 Carotid Arterial Duplex Study Patient Name:  Briana Foster  Date of Exam:   06/22/2020 Medical Rec #: [id number]         Accession #:    [id number] Date of Birth: 1945-05-10        Patient Gender: F Patient Age:   74Y Exam Location:  [facility] Procedure:      VAS US CAROTID Referring Phys: [id number] Wille Celeste THOMPSON --------------------------------------------------------------------------------  Indications:       Severe aortic stenosis. Risk Factors:      Hypertension, hyperlipidemia, Diabetes.  Comparison Study:  no prior Performing Technologist: Blanch Media RVS  Examination Guidelines: A complete evaluation includes B-mode imaging, spectral Doppler, color Doppler, and power Doppler as needed of all accessible portions of each vessel. Bilateral testing is considered an integral part of a complete examination. Limited examinations for reoccurring indications may be performed as noted.  Right Carotid Findings: +----------+--------+--------+--------+-------------------------+--------+           PSV cm/sEDV cm/sStenosisPlaque Description       Comments +----------+--------+--------+--------+-------------------------+--------+ CCA Prox  44      13              heterogenous                      +----------+--------+--------+--------+-------------------------+--------+ CCA Distal68      26              heterogenous and calcific         +----------+--------+--------+--------+-------------------------+--------+ ICA Prox  257     96      60-79%  heterogenous and calcific         +----------+--------+--------+--------+-------------------------+--------+ ICA Mid   168     78                                                +----------+--------+--------+--------+-------------------------+--------+ ICA Distal103     36                                                +----------+--------+--------+--------+-------------------------+--------+ ECA       86      17                                                +----------+--------+--------+--------+-------------------------+--------+ +----------+--------+-------+--------+-------------------+  PSV cm/sEDV cmsDescribeArm Pressure (mmHG) +----------+--------+-------+--------+-------------------+ Subclavian80                                         +----------+--------+-------+--------+-------------------+ +---------+--------+--+--------+--+---------+ VertebralPSV cm/s70EDV cm/s18Antegrade  +---------+--------+--+--------+--+---------+  Left Carotid Findings: +----------+--------+--------+--------+------------------+--------+           PSV cm/sEDV cm/sStenosisPlaque DescriptionComments +----------+--------+--------+--------+------------------+--------+ CCA Prox  92      29              heterogenous               +----------+--------+--------+--------+------------------+--------+ CCA Distal77      18              heterogenous               +----------+--------+--------+--------+------------------+--------+ ICA Prox  50      19      1-39%   heterogenous               +----------+--------+--------+--------+------------------+--------+ ICA Distal52      21                                         +----------+--------+--------+--------+------------------+--------+ ECA       55      12                                         +----------+--------+--------+--------+------------------+--------+ +----------+--------+--------+--------+-------------------+           PSV cm/sEDV cm/sDescribeArm Pressure (mmHG) +----------+--------+--------+--------+-------------------+ [id number]                                          +----------+--------+--------+--------+-------------------+ +---------+--------+--+--------+--+---------+ VertebralPSV cm/s47EDV cm/s23Antegrade +---------+--------+--+--------+--+---------+   Summary: Right Carotid: Velocities in the right ICA are consistent with a 60-79%                stenosis. Left Carotid: Velocities in the left ICA are consistent with a 1-39% stenosis. Vertebrals: Bilateral vertebral arteries demonstrate antegrade flow. *See table(s) above for measurements and observations.  Electronically signed by Sherald Hess MD on 06/22/2020 at 3:36:32 PM.    Final    ECHOCARDIOGRAM LIMITED  Result Date: 06/22/2020    ECHOCARDIOGRAM LIMITED REPORT   Patient Name:   Briana Foster Date of Exam: 06/22/2020 Medical Rec #:   [id number]        Height:       68.0 in Accession #:    [id number]       Weight:       212.1 lb Date of Birth:  08/02/1945       BSA:          2.095 m Patient Age:    74 years         BP:           121/95 mmHg Patient Gender: F                HR:           54 bpm. Exam Location:  Inpatient Procedure: Limited Echo, Cardiac Doppler and Limited Color Doppler Indications:    I35.0 Nonrheumatic aortic (  valve) stenosis  History:        Patient has prior history of Echocardiogram examinations, most                 recent 05/11/2020. Cardiomegaly, Signs/Symptoms:Murmur; Risk                 Factors:Hypertension, Diabetes, Dyslipidemia and GERD.  Sonographer:    Elmarie Shiley Dance Referring Phys: [id number] Salvatore Decent KLEIN IMPRESSIONS  1. Left ventricular ejection fraction, by estimation, is 55%. The left ventricle has no regional wall motion abnormalities. There is mild left ventricular hypertrophy. Left ventricular diastolic parameters are indeterminate.  2. Right ventricular systolic function is mildly reduced. The right ventricular size is mildly enlarged. Peak RV-RA gradient 26 mmHg.  3. The mitral valve is abnormal. Mild mitral valve regurgitation. Moderate mitral annular and valvular calcification. Visually, there appears to be mitral stenosis but gradient was not measured. Would consider additional images to assess degree of mitral stenosis.  4. Tricuspid valve regurgitation is moderate to severe.  5. The aortic valve is tricuspid. Aortic valve regurgitation is not visualized. Severe aortic valve stenosis. Aortic valve area, by VTI measures 0.70 cm. Aortic valve mean gradient measures 38.0 mmHg.  6. The IVC was not visualized. FINDINGS  Left Ventricle: Left ventricular ejection fraction, by estimation, is 55%. The left ventricle has no regional wall motion abnormalities. The left ventricular internal cavity size was normal in size. There is mild left ventricular hypertrophy. Left ventricular diastolic parameters are  indeterminate. Right Ventricle: The right ventricular size is mildly enlarged. No increase in right ventricular wall thickness. Right ventricular systolic function is mildly reduced. Pericardium: There is no evidence of pericardial effusion. Mitral Valve: The mitral valve is abnormal. There is moderate calcification of the mitral valve leaflet(s). Moderate mitral annular calcification. Mild mitral valve regurgitation. Tricuspid Valve: The tricuspid valve is normal in structure. Tricuspid valve regurgitation is moderate to severe. Aortic Valve: The aortic valve is tricuspid. Aortic valve regurgitation is not visualized. Severe aortic stenosis is present. Aortic valve mean gradient measures 38.0 mmHg. Aortic valve peak gradient measures 58.1 mmHg. Aortic valve area, by VTI measures  0.70 cm. Pulmonic Valve: The pulmonic valve was normal in structure. Pulmonic valve regurgitation is trivial. Aorta: The aortic root is normal in size and structure. Venous: The inferior vena cava was not well visualized. LEFT VENTRICLE PLAX 2D LVIDd:         4.10 cm LVIDs:         3.10 cm LV PW:         1.40 cm LV IVS:        1.00 cm LVOT diam:     2.00 cm LV SV:         64 LV SV Index:   30 LVOT Area:     3.14 cm  LEFT ATRIUM         Index LA diam:    4.50 cm 2.15 cm/m  AORTIC VALVE AV Area (Vmax):    0.75 cm AV Area (Vmean):   0.71 cm AV Area (VTI):     0.70 cm AV Vmax:           381.20 cm/s AV Vmean:          273.000 cm/s AV VTI:            0.910 m AV Peak Grad:      58.1 mmHg AV Mean Grad:      38.0 mmHg LVOT Vmax:  90.60 cm/s LVOT Vmean:        61.900 cm/s LVOT VTI:          0.203 m LVOT/AV VTI ratio: 0.22  AORTA Ao Root diam: 2.80 cm Ao Asc diam:  3.20 cm TRICUSPID VALVE TR Peak grad:   26.6 mmHg TR Vmax:        258.00 cm/s  SHUNTS Systemic VTI:  0.20 m Systemic Diam: 2.00 cm Marca Ancona MD Electronically signed by Marca Ancona MD Signature Date/Time: 06/22/2020/9:08:39 PM    Final     Cardiac Studies    RHC/LHC/corrs Result date: 06/17/20 Very poor rate control related to patient not taking medications.  Atrial fibrillation with RVR varying between 101 160 bpm.  Therefore, the patient was treated with IV metoprolol receiving 15 mg and 5 mg doses over 30 minutes before proceeding.  This decrease the heart rate into the 90 to 115 bpm range. Widely patent coronary arteries.  Vessels are tortuous but have no significant obstruction. Normal pulmonary artery pressures with mean pulmonary wedge 23 mmHg (influenced by rate). Variable RR interval made it difficult to appropriately assess left ventricular gradient.  "Eyeball" peak to peak gradient is 20 mmHg. AV coronary and mitral annular calcification noted on cine fluoroscopy. Cardiac output by Fick 4.82 L/min with index 1.99 L/min/m.  These findings are consistent with potential low-flow low gradient aortic stenosis as suspected.  Calculated aortic valve area 1.0 cm based upon a mean gradient of 14 mmHg.   TTE Result date: 05/11/20  1. Left ventricular ejection fraction, by estimation, is 55 to 60%. The  left ventricle has normal function. The left ventricle has no regional  wall motion abnormalities. There is mild left ventricular hypertrophy.  Left ventricular diastolic parameters  are indeterminate.   2. Right ventricular systolic function is normal. The right ventricular  size is normal. There is normal pulmonary artery systolic pressure. The  estimated right ventricular systolic pressure is 29.6 mmHg.   3. Left atrial size was moderately dilated.   4. Right atrial size was moderately dilated.   5. The mitral valve is degenerative. Trivial mitral valve regurgitation.  Moderate mitral annular calcification.   6. The aortic valve is tricuspid. Aortic valve regurgitation is trivial.  Possible paradoxical low flow/low gradient severe aortic valve stenosis,  however patient is in atrial fibrillation with RVR so would repeat echo  when rate  is better-controlled.  Aortic valve area, by VTI measures 0.73 cm. Aortic valve mean gradient  measures 29.0 mmHg.   7. The inferior vena cava is normal in size with greater than 50%  respiratory variability, suggesting right atrial pressure of 3 mmHg.   8. Atrial fibrillation with RVR.   TTE 06/22/20 IMPRESSIONS     1. Left ventricular ejection fraction, by estimation, is 55%. The left  ventricle has no regional wall motion abnormalities. There is mild left  ventricular hypertrophy. Left ventricular diastolic parameters are  indeterminate.   2. Right ventricular systolic function is mildly reduced. The right  ventricular size is mildly enlarged. Peak RV-RA gradient 26 mmHg.   3. The mitral valve is abnormal. Mild mitral valve regurgitation.  Moderate mitral annular and valvular calcification. Visually, there  appears to be mitral stenosis but gradient was not measured. Would  consider additional images to assess degree of  mitral stenosis.   4. Tricuspid valve regurgitation is moderate to severe.   5. The aortic valve is tricuspid. Aortic valve regurgitation is not  visualized. Severe aortic valve stenosis.  Aortic valve area, by VTI  measures 0.70 cm. Aortic valve mean gradient measures 38.0 mmHg.   6. The IVC was not visualized.   FINDINGS   Left Ventricle: Left ventricular ejection fraction, by estimation, is  55%. The left ventricle has no regional wall motion abnormalities. The  left ventricular internal cavity size was normal in size. There is mild  left ventricular hypertrophy. Left  ventricular diastolic parameters are indeterminate.   Right Ventricle: The right ventricular size is mildly enlarged. No  increase in right ventricular wall thickness. Right ventricular systolic  function is mildly reduced.   Pericardium: There is no evidence of pericardial effusion.   Mitral Valve: The mitral valve is abnormal. There is moderate  calcification of the mitral valve  leaflet(s). Moderate mitral annular  calcification. Mild mitral valve regurgitation.   Tricuspid Valve: The tricuspid valve is normal in structure. Tricuspid  valve regurgitation is moderate to severe.   Aortic Valve: The aortic valve is tricuspid. Aortic valve regurgitation is  not visualized. Severe aortic stenosis is present. Aortic valve mean  gradient measures 38.0 mmHg. Aortic valve peak gradient measures 58.1  mmHg. Aortic valve area, by VTI measures   0.70 cm.   Pulmonic Valve: The pulmonic valve was normal in structure. Pulmonic valve  regurgitation is trivial.   Aorta: The aortic root is normal in size and structure.   Venous: The inferior vena cava was not well visualized.   Carotid dopplers 06/22/20 Summary:  Right Carotid: Velocities in the right ICA are consistent with a 60-79%                 stenosis.   Left Carotid: Velocities in the left ICA are consistent with a 1-39%  stenosis.   Vertebrals: Bilateral vertebral arteries demonstrate antegrade flow.   Patient Profile     75 y.o. female  with permanent atrial fibrillation, aortic stenosis presenting with fatigue/generalized weakness with severe AS.  Assessment & Plan    Permanent atrial fibrillation: -CHA2DS2/VAS Stroke Risk Points=4  -continue apixaban 5 mg BID -on rate control with diltiazem 180 mg daily and metoprolol tartrate 50 mg q6 hours. Was in RVR during recent cath. -no utility to rhythm control given permanent atrial fib. Per Dr. Lovena Neighbours last note, trialed on 180 mg BID diltiazem. Had nausea/emesis on amiodarone.   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   Hypercholesterolemia: per report, no recent lipids available. -coronary calcium, but no obstructive CAD by cath -continue  rosuvastatin 20 mg daily   Hypertension: -BP stable on metoprolol and diltiazem   Type II diabetes: -last A1c 7.0 03/22/18 -blood sugars well controlled this admission  -continue sliding scale insulin   Chronic anemia: Hgb 11.3 today, recent range 11-12  For questions or updates, please contact [facility] Please consult [email or web address] for contact info under        Signed, Nada Boozer, NP  06/23/2020, 9:16 AM

## 2020-06-23 NOTE — H&P (View-Only) (Signed)
HEART AND [place] VALVE TEAM  Cardiology Consultation:   Patient ID: Briana Foster MRN: [id number]; DOB: [birth year]  Admit date: 06/20/2020 Date of Consult: 06/24/2020  Primary Care Provider: Wenda Low, MD [facility] HeartCare Cardiologist: Briana Grooms, MD  [facility] HeartCare Electrophysiologist:  Briana Epley, MD    Patient Profile:   Briana Foster is a 75 y.o. female with a hx of obesity, DMT2, HTN, HLD, anemia, carotid stenosis (60-79% RICA stenosis), rheumatoid arthritis, GERD, permanent atrial fibrillation on Eliquis with difficult to control rates who is being seen today for the evaluation of severe AS at the request of Briana Foster.  History of Present Illness:   Briana Foster is retired from working from [employer]. She lives in the country outside of [place] with her daughter. She has full dentures and is fully vaccinated with the primary series for Covid 19. She takes care of all of her own ADLs and continues to drive and do her own errands. She does not have any issues with ambulation and can walk without the assistance of a walker or cane. She does not exercise regularly but does occasionally take 10-15 minute walks outside.  She is followed by Briana Foster and Briana Foster in the outpatient setting for aortic stenosis and afib. She is felt to have permanent atrial fibrillation having failed to maintain NSR on amiodarone. A rate control strategy with metoprolol and long acting diltiazem was recommended. Echo 05/11/20 showed EF 55-60%, mild LVH, moderate MAC with trivial MR, severe LFLG AS with mean gradient 29 mm Hg, peak gradient 31 mm hg, AVA 0.73 cm2, DVI 0.23, SVI 19. Of note, she was in rapid afib during this echo evaluation. She complained of exertional fatigue and dyspnea. She was referred to Briana Foster for structural heart evaluation and seen on 06/10/20 and noted to have afib with RVR during that visit and started back on oral  amio for better rate control. This was later discontinued due to nausea.  She underwent diagnostic L/RHC on 06/17/20 which showed widely patent coronary arteries (vessels tortuous but with no significant obstruction). She was also noted to be in afib with RVR with rates varying between 101-160 bpm in the setting of not taking her oral medications that morning. She was treated with several doses of IV metoprolol. Variable RR interval made it difficult to appropriately assess left ventricular gradient. "Eyeball" peak to peak gradient is 20 mmHg. AV coronary and mitral annular calcification noted on cine fluoroscopy. Cardiac output by Fick 4.82 L/min with index 1.99 L/min/m.  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  Calculated aortic valve area 1.0 cm based upon a mean gradient of 14 mmHg. She was discharged home after her cath.  She then returned to the hospital on 5/22 via EMS for marked fatigue and generalized weakness. Work up in the ER was unremarkable except for continued afib with RVR and she was started back on amiodarone. CT angio to rule out dissection showed no acute abnormality or PE. There was nonspecific right hilar lymphadenopathy and prominent left hilar/mediastinal lymph nodes. Findings may be reactive in etiology. 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  EP was consulted for discission of AV node ablation + PPM placement. They recommended continued medical therapy with amiodarone since HRs had improved to 60-100 bpm. However, her HRs are now much better  controlled and she has even had some bradycardia into the 40s with 2.75 second pauses. Amio has now been discontinued once again.  Patient underwent cardiac CT on 5/23 which reveals anatomical characteristics consistent with aortic stenosis suitable for treatment by transcatheter  aortic valve replacement without any significant complicating features aside from some bulky calcification noted on the [place]. Carotid dopplers showed 4-17% LICA stenosis and 40-81% RICA stenosis. CT angio chest/abd/pelvis (done to rule out dissection) in ER reviewed and it appears that she has TF access. Repeat limited echo 06/22/20 showed EF 55%, mild LVH, mild RV dysfunction/enlargement, moderate MAC with mild MR and possible MS, moderate to severe TR and severe AS with a mean gradient of 38 mm hg, AVA 0.70cm2, DVI 0.22.  The patient is seen sitting in bed and visiting with pastor and wife. She is feeling better with HR control. She is very anxious about getting her valve done in order to have more energy and be less winded. She denies chest pain. She denies LE edema, orthopnea or PND. She denies dizziness or syncope. She is very anxious to get surgery done. Also, they have a cruise in early July that they have been putting off for three years due to Covid that she would like to go on.   Past Medical History:  Diagnosis Date  . Anemia   . Arthritis 07-20-12   hands  . Asthma   . Back pain   . Cardiomegaly   . Class 1 obesity with serious comorbidity and body mass index (BMI) of 31.0 to 31.9 in adult 03/26/2018  . Depression   . Essential hypertension 03/26/2018  . GERD (gastroesophageal reflux disease)    "comes and goes" - no meds currently  . Heart murmur    told as teenager  . Hyperlipidemia   . Joint pain   . Restless leg syndrome   . Rheumatoid arthritis ([place])   . Shortness of breath   . Tiredness   . Type 2 diabetes mellitus without complication, without long-term current use of insulin ([place]) 04/10/2018  . Vitamin D deficiency     Past Surgical History:  Procedure Laterality Date  . BUNIONECTOMY  20 yrs ago   bil feet  . BUNIONECTOMY  11/30/2011   Procedure: Lillard Anes;  Surgeon: Magnus Sinning, MD;  Location: [facility];  Service: Orthopedics;  Laterality: Bilateral;  RIGHT FOOT  EXCISION OF BUNIONETTE AND PARTIAL   PROXIMAL PHALANGECTOMY OF 5TH TOE LEFT FOOT FUNK BUNIONECTOMY,EXCISION OF BUNIONETTE   . CARDIOVERSION N/A 11/05/2019   Procedure: CARDIOVERSION;  Surgeon: Lelon Perla, MD;  Location: [facility] ENDOSCOPY;  Service: Cardiovascular;  Laterality: N/A;  . CARDIOVERSION N/A 12/05/2019   Procedure: CARDIOVERSION;  Surgeon: Elouise Munroe, MD;  Location: [place];  Service: Cardiovascular;  Laterality: N/A;  . COLONOSCOPY WITH PROPOFOL N/A 08/07/2012   Procedure: COLONOSCOPY WITH PROPOFOL;  Surgeon: Garlan Fair, MD;  Location: [facility] ENDOSCOPY;  Service: Endoscopy;  Laterality: N/A;  . MOUTH SURGERY     teeth extractions  . RIGHT/LEFT HEART CATH AND CORONARY ANGIOGRAPHY N/A 06/17/2020   Procedure: RIGHT/LEFT HEART CATH AND CORONARY ANGIOGRAPHY;  Surgeon: Belva Crome, MD;  Location: [place] CV LAB;  Service: Cardiovascular;  Laterality: N/A;  . TONSILLECTOMY    . TUBAL LIGATION    . WRIST SURGERY       Home Medications:  Prior to Admission medications   Medication Sig Start Date End Date Taking? Authorizing Provider  albuterol (VENTOLIN HFA) 108 (90 Base)  MCG/ACT inhaler Inhale 2 puffs into the lungs every 4 (four) hours as needed for wheezing or shortness of breath.   Yes [provider]  amiodarone (PACERONE) 200 MG tablet Take one tablet by mouth twice daily for 3 weeks, then decrease to one tablet by mouth daily. Patient taking differently: Take 200 mg by mouth daily. 06/11/20  Yes Belva Crome, MD  amLODipine (NORVASC) 5 MG tablet Take 5 mg by mouth daily in the afternoon.   Yes [provider]  apixaban (ELIQUIS) 5 MG TABS tablet Take 1 tablet (5 mg total) by mouth 2 (two) times daily. Resume at 8 PM tonight Patient taking differently: Take 5 mg by mouth 2 (two) times daily. 06/17/20  Yes Belva Crome, MD  cetirizine (ZYRTEC) 10 MG tablet Take 10 mg by mouth daily as needed for allergies.   Yes [provider]   diclofenac Sodium (VOLTAREN) 1 % GEL Apply 1 application topically 2 (two) times daily as needed (pain). 02/21/20  Yes [provider]  diltiazem (CARDIZEM CD) 180 MG 24 hr capsule Take 1 capsule (180 mg total) by mouth in the morning and at bedtime. 06/10/20  Yes Burnell Blanks, MD  metFORMIN (GLUCOPHAGE-XR) 750 MG 24 hr tablet Take 750 mg by mouth daily in the afternoon. 01/08/20  Yes [provider]  metoprolol tartrate (LOPRESSOR) 50 MG tablet Take 50 mg by mouth daily.   Yes [provider]  Multiple Vitamins-Minerals (MULTIVITAMIN WITH MINERALS) tablet Take 1 tablet by mouth daily.   Yes [provider]  Naphazoline-Pheniramine (ALLERGY EYE OP) Place 1 drop into both eyes daily as needed (red/itchy eyes).   Yes [provider]  pantoprazole (PROTONIX) 40 MG tablet Take 40 mg by mouth daily as needed (heartburn).   Yes [provider]  rOPINIRole (REQUIP) 4 MG tablet Take 4 mg by mouth at bedtime.   Yes [provider]  rosuvastatin (CRESTOR) 20 MG tablet Take 20 mg by mouth daily in the afternoon.   Yes [provider]  vitamin C (ASCORBIC ACID) 500 MG tablet Take 500 mg by mouth daily.   Yes [provider]    Inpatient Medications: Scheduled Meds: . apixaban  5 mg Oral BID  . diltiazem  180 mg Oral BID  . insulin aspart  0-15 Units Subcutaneous TID WC  . metoprolol tartrate  50 mg Oral Q12H  . rOPINIRole  4 mg Oral QHS  . rosuvastatin  20 mg Oral Q1500   Continuous Infusions:  PRN Meds: acetaminophen, albuterol, ondansetron (ZOFRAN) IV, pantoprazole  Allergies:    Allergies  Allergen Reactions  . Bee Venom Shortness Of Breath and Rash  . Apricot Flavor Swelling    Eyes swell shut  . Atorvastatin Other (See Comments)  . Wasp Venom Other (See Comments)    Social History:   Social History   Socioeconomic History  . Marital status: Widowed    Spouse name: Not on file  . Number of  children: 2  . Years of education: Not on file  . Highest education level: Not on file  Occupational History  . Occupation: Retired-worked at [employer]  . Smoking status: Never Smoker  . Smokeless tobacco: Never Used  Substance and Sexual Activity  . Alcohol use: No  . Drug use: No  . Sexual activity: Not Currently  Other Topics Concern  . Not on file  Social History Narrative  . Not on file   Social Determinants of  Health   Financial Resource Strain: Not on file  Food Insecurity: Not on file  Transportation Needs: Not on file  Physical Activity: Not on file  Stress: Not on file  Social Connections: Not on file  Intimate Partner Violence: Not on file    Family History:    Family History  Problem Relation Age of Onset  . Heart disease Mother   . Stroke Mother   . Cancer Father        Prostate     ROS:  Please see the history of present illness.  All other ROS reviewed and negative.     Physical Exam/Data:   Vitals:   06/23/20 1940 06/23/20 2320 06/24/20 0349 06/24/20 0757  BP: (!) 133/104 (!) 145/97 (!) 120/92 (!) 123/92  Pulse: 74 93 100 87  Resp: _0 Temp: 98.6 F (37 C) 98 F (36.7 C) 98 F (36.7 C) 97.9 F (36.6 C)  TempSrc: Oral Oral Oral Oral  SpO2: 92% 90% 92% 94%  Weight:   96 kg   Height:        Intake/Output Summary (Last 24 hours) at 06/24/2020 0944 Last data filed at 06/23/2020 1407 Gross per 24 hour  Intake 240 ml  Output --  Net 240 ml   Last 3 Weights 06/24/2020 06/23/2020 06/22/2020  Weight (lbs) 211 lb 11.2 oz 213 lb 10 oz 212 lb 1.3 oz  Weight (kg) 96.026 kg 96.9 kg 96.2 kg     Body mass index is 32.19 kg/m.  General:  Well nourished, well developed, in no acute distress, obese HEENT: normal Lymph: no adenopathy Neck: no JVD Endocrine:  No thryomegaly Cardiac:  normal S1, S2; RRR; 3/6 SEM heard best at LLSB Lungs:  clear to auscultation bilaterally, no wheezing, rhonchi or rales  Abd: soft, nontender, no  hepatomegaly  Ext: no edema Musculoskeletal:  No deformities, BUE and BLE strength normal and equal Skin: warm and dry  Neuro:  CNs 2-12 intact, no focal abnormalities noted Psych:  Normal affect   EKG:  The EKG was personally reviewed and demonstrates: afib with HR 95 Telemetry:  Telemetry was personally reviewed and demonstrates:  afib with HRs 40-60s with frequent short pauses, longest 2.75 seconds.  Relevant CV Studies: Echo 05/11/20: 1. Left ventricular ejection fraction, by estimation, is 55 to 60%. The  left ventricle has normal function. The left ventricle has no regional  wall motion abnormalities. There is mild left ventricular hypertrophy.  Left ventricular diastolic parameters  are indeterminate.  2. Right ventricular systolic function is normal. The right ventricular  size is normal. There is normal pulmonary artery systolic pressure. The  estimated right ventricular systolic pressure is 09.6 mmHg.  3. Left atrial size was moderately dilated.  4. Right atrial size was moderately dilated.  5. The mitral valve is degenerative. Trivial mitral valve regurgitation.  Moderate mitral annular calcification.  6. The aortic valve is tricuspid. Aortic valve regurgitation is trivial.  Possible paradoxical Foster flow/Foster gradient severe aortic valve stenosis,  however patient is in atrial fibrillation with RVR so would repeat echo  when rate is better-controlled.  Aortic valve area, by VTI measures 0.73 cm. Aortic valve mean gradient  measures 29.0 mmHg.  7. The inferior vena cava is normal in size with greater than 50%  respiratory variability, suggesting right atrial pressure of 3 mmHg.  8. Atrial fibrillation with RVR.   FINDINGS  Left Ventricle: Left ventricular ejection fraction, by estimation, is 55  to 60%.  The left ventricle has normal function. The left ventricle has no  regional wall motion abnormalities. The left ventricular internal cavity  size was normal in  size. There is  mild left ventricular hypertrophy. Left ventricular diastolic parameters  are indeterminate.   Right Ventricle: The right ventricular size is normal. No increase in  right ventricular wall thickness. Right ventricular systolic function is  normal. There is normal pulmonary artery systolic pressure. The tricuspid  regurgitant velocity is 2.58 m/s, and  with an assumed right atrial pressure of 3 mmHg, the estimated right  ventricular systolic pressure is 41.7 mmHg.   Left Atrium: Left atrial size was moderately dilated.   Right Atrium: Right atrial size was moderately dilated.   Pericardium: There is no evidence of pericardial effusion.   Mitral Valve: The mitral valve is degenerative in appearance. There is  moderate calcification of the mitral valve leaflet(s). Moderate mitral  annular calcification. Trivial mitral valve regurgitation.   Tricuspid Valve: The tricuspid valve is normal in structure. Tricuspid  valve regurgitation is mild.   Aortic Valve: The aortic valve is tricuspid. Aortic valve regurgitation is  trivial. Severe aortic stenosis is present. Aortic valve mean gradient  measures 29.0 mmHg. Aortic valve peak gradient measures 31.3 mmHg. Aortic  valve area, by VTI measures 0.73  cm.   Pulmonic Valve: The pulmonic valve was normal in structure. Pulmonic valve  regurgitation is not visualized.   Aorta: The aortic root is normal in size and structure.   Venous: The inferior vena cava is normal in size with greater than 50%  respiratory variability, suggesting right atrial pressure of 3 mmHg.   IAS/Shunts: No atrial level shunt detected by color flow Doppler.     LEFT VENTRICLE  PLAX 2D  LVIDd:     4.30 cm  LVIDs:     3.30 cm  LV PW:     1.20 cm  LV IVS:    1.40 cm  LVOT diam:   2.00 cm  LV SV:     39  LV SV Index:  19  LVOT Area:   3.14 cm     RIGHT VENTRICLE      IVC  RVSP:      29.6 mmHg IVC  diam: 1.70 cm   LEFT ATRIUM       Index    RIGHT ATRIUM      Index  LA diam:    3.90 cm 1.90 cm/m RA Pressure: 3.00 mmHg  LA Vol (A2C):  106.0 ml 51.66 ml/m RA Area:   18.70 cm  LA Vol (A4C):  79.7 ml 38.84 ml/m RA Volume:  55.40 ml 27.00 ml/m  LA Biplane Vol: 92.3 ml 44.98 ml/m  AORTIC VALVE  AV Area (Vmax):  0.79 cm  AV Area (Vmean):  0.75 cm  AV Area (VTI):   0.73 cm  AV Vmax:      279.60 cm/s  AV Vmean:     206.600 cm/s  AV VTI:      0.536 m  AV Peak Grad:   31.3 mmHg  AV Mean Grad:   29.0 mmHg  LVOT Vmax:     69.88 cm/s  LVOT Vmean:    49.020 cm/s  LVOT VTI:     0.124 m  LVOT/AV VTI ratio: 0.23    AORTA  Ao Root diam: 3.10 cm  Ao Asc diam: 3.20 cm   MV E velocity: 157.40 cm/s TRICUSPID VALVE  TR Peak grad:  26.6 mmHg               TR Vmax:    258.00 cm/s               Estimated RAP: 3.00 mmHg               RVSP:      29.6 mmHg                 SHUNTS               Systemic VTI: 0.12 m               Systemic Diam: 2.00 cm ________________________   06/17/20 RIGHT/LEFT HEART CATH AND CORONARY ANGIOGRAPHY  Conclusion  Very poor rate control related to patient not taking medications.  Atrial fibrillation with RVR varying between 101 160 bpm.  Therefore, the patient was treated with IV metoprolol receiving 15 mg and 5 mg doses over 30 minutes before proceeding.  This decrease the heart rate into the 90 to 115 bpm range.  Widely patent coronary arteries.  Vessels are tortuous but have no significant obstruction.  Normal pulmonary artery pressures with mean pulmonary wedge 23 mmHg (influenced by rate).  Variable RR interval made it difficult to appropriately assess left ventricular gradient.  "Eyeball" peak to peak gradient is 20 mmHg.  AV coronary and mitral  annular calcification noted on cine fluoroscopy.  Cardiac output by Fick 4.82 L/min with index 1.99 L/min/m.  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  Calculated aortic valve area 1.0 cm based upon a mean gradient of 14 mmHg.  RECOMMENDATIONS:   Back to Briana Foster for CT imaging and referral to cardiac surgery for second opinion concerning management with TAVR versus SAVR.  ______________________  Carotid dopplers 06/22/20 Summary:  Right Carotid: Velocities in the right ICA are consistent with a 60-79%         stenosis.   Left Carotid: Velocities in the left ICA are consistent with a 1-39%  stenosis.   Vertebrals: Bilateral vertebral arteries demonstrate antegrade flow.   *See table(s) above for measurements and observations.    __________________________  Echo limited 06/23/20 IMPRESSIONS  1. Left ventricular ejection fraction, by estimation, is 55%. The left  ventricle has no regional wall motion abnormalities. There is mild left  ventricular hypertrophy. Left ventricular diastolic parameters are  indeterminate.  2. Right ventricular systolic function is mildly reduced. The right  ventricular size is mildly enlarged. Peak RV-RA gradient 26 mmHg.  3. The mitral valve is abnormal. Mild mitral valve regurgitation.  Moderate mitral annular and valvular calcification. Visually, there  appears to be mitral stenosis but gradient was not measured. Would  consider additional images to assess degree of  mitral stenosis.  4. Tricuspid valve regurgitation is moderate to severe.  5. The aortic valve is tricuspid. Aortic valve regurgitation is not  visualized. Severe aortic valve stenosis. Aortic valve area, by VTI  measures 0.70 cm. Aortic valve mean gradient measures 38.0 mmHg.  6. The IVC was not visualized.   FINDINGS  Left Ventricle: Left ventricular ejection fraction, by estimation, is  55%. The  left ventricle has no regional wall motion abnormalities. The  left ventricular internal cavity size was normal in size. There is mild  left ventricular hypertrophy. Left  ventricular diastolic parameters are indeterminate.   Right Ventricle: The right ventricular size is mildly enlarged. No  increase in  right ventricular wall thickness. Right ventricular systolic  function is mildly reduced.   Pericardium: There is no evidence of pericardial effusion.   Mitral Valve: The mitral valve is abnormal. There is moderate  calcification of the mitral valve leaflet(s). Moderate mitral annular  calcification. Mild mitral valve regurgitation.   Tricuspid Valve: The tricuspid valve is normal in structure. Tricuspid  valve regurgitation is moderate to severe.   Aortic Valve: The aortic valve is tricuspid. Aortic valve regurgitation is  not visualized. Severe aortic stenosis is present. Aortic valve mean  gradient measures 38.0 mmHg. Aortic valve peak gradient measures 58.1  mmHg. Aortic valve area, by VTI measures  0.70 cm.   Pulmonic Valve: The pulmonic valve was normal in structure. Pulmonic valve  regurgitation is trivial.   Aorta: The aortic root is normal in size and structure.   Venous: The inferior vena cava was not well visualized.   LEFT VENTRICLE  PLAX 2D  LVIDd:     4.10 cm  LVIDs:     3.10 cm  LV PW:     1.40 cm  LV IVS:    1.00 cm  LVOT diam:   2.00 cm  LV SV:     64  LV SV Index:  30  LVOT Area:   3.14 cm     LEFT ATRIUM     Index  LA diam:  4.50 cm 2.15 cm/m  AORTIC VALVE  AV Area (Vmax):  0.75 cm  AV Area (Vmean):  0.71 cm  AV Area (VTI):   0.70 cm  AV Vmax:      381.20 cm/s  AV Vmean:     273.000 cm/s  AV VTI:      0.910 m  AV Peak Grad:   58.1 mmHg  AV Mean Grad:   38.0 mmHg  LVOT Vmax:     90.60 cm/s  LVOT Vmean:    61.900 cm/s  LVOT VTI:     0.203 m  LVOT/AV VTI ratio: 0.22     AORTA  Ao Root diam: 2.80 cm  Ao Asc diam: 3.20 cm   TRICUSPID VALVE  TR Peak grad:  26.6 mmHg  TR Vmax:    258.00 cm/s    SHUNTS  Systemic VTI: 0.20 m  Systemic Diam: 2.00 cm   ______________________________  CTA chest/abd/pelvis (for dissection) 06/21/20 IMPRESSION: 1. No acute aortic abnormality. Aortic Atherosclerosis (ICD10-I70.0) including four-vessel coronary artery calcifications and moderate mitral annular calcifications. 2. No central or segmental pulmonary embolus. 3. Nonspecific right hilar lymphadenopathy and prominent left hilar/mediastinal lymph nodes. Findings may be reactive in etiology. Recommend attention on follow-up. 4. 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 Correlation with prior cross-sectional imaging would be of value. Recommend attention on follow-up. 5. Mild cardiomegaly with question of patent foramen ovale. 6. Scattered colonic diverticulosis with no acute diverticulitis.  ___________________________  Cardiac CT 06/22/20 IMPRESSION: 1. 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 Bulky calcification noted on the [place].  2. Annular measurements appropriate for 23 mm [place] 3 TAVR (374 mm2).  3. No significant annular or subannular calcifications.  4. Sufficient coronary to annulus distance.  5. Optimal Fluoroscopic Angle for Delivery: LAO 10 CRA 21  6. There is mixing artifact in the LAA, cannot exclude thrombus.  7. The mitral valve is degenerative with moderate to severe annular calcification.  Laboratory Data:  High Sensitivity Troponin:   Recent Labs  Lab 06/21/20 0002 06/21/20 0427  TROPONINIHS 8 7  Chemistry Recent Labs  Lab 06/21/20 0002 06/21/20 0218 06/22/20 0239  NA 138  --  137  K 6.0* 3.7 4.0  CL 107  --  106  CO2 23  --  25  GLUCOSE 152*  --  154*  BUN 16  --  16  CREATININE 0.99  --  0.99  CALCIUM 9.4  --  9.2  GFRNONAA >60  --   >60  ANIONGAP 8  --  6    Recent Labs  Lab 06/21/20 0002  PROT 6.9  ALBUMIN 3.9  AST 63*  ALT 42  ALKPHOS 67  BILITOT 1.5*   Hematology Recent Labs  Lab 06/21/20 0002 06/22/20 0239  WBC 10.1 8.0  RBC 3.94 3.67*  HGB 12.5 11.3*  HCT 38.3 35.1*  MCV 97.2 95.6  MCH 31.7 30.8  MCHC 32.6 32.2  RDW 14.1 13.8  PLT 160 200   BNP Recent Labs  Lab 06/21/20 1648  BNP 250.5*    DDimer No results for input(s): DDIMER in the last 168 hours.   Radiology/Studies:  CT CORONARY MORPH W/CTA COR W/SCORE W/CA W/CM &/OR WO/CM  Addendum Date: 06/22/2020   ADDENDUM REPORT: 06/22/2020 19:07 CLINICAL DATA:  Severe Aortic Stenosis. EXAM: Cardiac TAVR CT TECHNIQUE: A non-contrast, gated CT scan was obtained with axial slices of 3 mm through the heart for aortic valve calcium scoring. A 120 kV retrospective, gated, contrast cardiac scan was obtained. Gantry rotation speed was 250 msecs and collimation was 0.6 mm. Nitroglycerin was not given. The 3D data set was reconstructed in 5% intervals of the 0-95% of the R-R cycle. Systolic and diastolic phases were analyzed on a dedicated workstation using MPR, MIP, and VRT modes. The patient received 100 cc of contrast. FINDINGS: Image quality: Excellent. Noise artifact is: Limited. Valve Morphology: The aortic valve is tricuspid and severely calcified. There is bulky calcification of the [place] with partial fusion of the NCC/LCC. The leaflets demonstrate severely restricted leaflet motion in systole. Aortic Valve Calcium score: 1083 Aortic annular dimension: Phase assessed: 25% Annular area: 374 mm2 Annular perimeter: 70.7 mm Max diameter: 25.5 mm Min diameter: 18.3 mm Annular and subannular calcification: None. Optimal coplanar projection: LAO 10 CRA 21 Coronary Artery Height above Annulus: Left Main: 12.5 mm Right Coronary: 15.2 mm Sinus of Valsalva Measurements: Non-coronary: 30 mm Right-coronary: 28 mm Left-coronary: 30 mm Sinus of Valsalva Height: Non-coronary:  21.9 mm Right-coronary: 21.5 mm Left-coronary: 18.0 mm Sinotubular Junction: 25 mm Ascending Thoracic Aorta: 31 mm Coronary Arteries: Normal coronary origin. Right dominance. The study was performed without use of NTG and is insufficient for plaque evaluation. Please refer to recent cardiac catheterization for coronary assessment. Cardiac Morphology: Right Atrium: Right atrial size is dilated. Right Ventricle: The right ventricular cavity is within normal limits. Left Atrium: Left atrial size is dilated. There is mixing artifact in the LAA, cannot exclude thrombus. Left Ventricle: The ventricular cavity size is within normal limits. There are no stigmata of prior infarction. There is no abnormal filling defect. Normal left ventricular function, EF=59%. No regional wall motion abnormalities. Pulmonary arteries: Normal in size without proximal filling defect. Pulmonary veins: Normal pulmonary venous drainage. Pericardium: Normal thickness with no significant effusion or calcium present. Mitral Valve: The mitral valve is degenerative with moderate to severe annular calcification. Extra-cardiac findings: See attached radiology report for non-cardiac structures. IMPRESSION: 1. 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 Bulky calcification noted on the [place]. 2. Annular measurements appropriate for 23 mm Sapien 3  TAVR (374 mm2). 3. No significant annular or subannular calcifications. 4. Sufficient coronary to annulus distance. 5. Optimal Fluoroscopic Angle for Delivery: LAO 10 CRA 21 6. There is mixing artifact in the LAA, cannot exclude thrombus. 7. The mitral valve is degenerative with moderate to severe annular calcification. [place] T. Audie Box, MD Electronically Signed   By: Eleonore Chiquito   On: 06/22/2020 19:07   Result Date: 06/22/2020 EXAM: OVER-READ INTERPRETATION  CT CHEST The following report is an over-read performed by radiologist Dr. Vinnie Langton of [facility], [place] on 06/22/2020. This  over-read does not include interpretation of cardiac or coronary anatomy or pathology. The coronary calcium score/coronary CTA interpretation by the cardiologist is attached. COMPARISON:  None. 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 Within the visualized portions of the thorax there are no suspicious appearing pulmonary nodules or masses, there is no acute consolidative airspace disease, no pleural effusions, no pneumothorax and no lymphadenopathy. Visualized portions of the upper abdomen are unremarkable. There are no aggressive appearing lytic or blastic lesions noted in the visualized portions of the skeleton. IMPRESSION: 1. 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 Electronically Signed: By: Vinnie Langton M.D. On: 06/22/2020 16:27   DG Chest Portable 1 View  Result Date: 06/21/2020 CLINICAL DATA:  Shortness of breath. EXAM: PORTABLE CHEST 1 VIEW COMPARISON:  December 07, 2016 FINDINGS: Mild, chronic appearing increased lung markings are seen without evidence of focal consolidation, pleural effusion or pneumothorax. Very mild peribronchial thickening is noted within the perihilar regions, bilaterally. The cardiac silhouette is mildly enlarged. The visualized skeletal structures are unremarkable. IMPRESSION: Mild bilateral peribronchial thickening without an acute infiltrate. Electronically Signed   By: Virgina Norfolk M.D.   On: 06/21/2020 00:18   CT Angio Chest/Abd/Pel for Dissection W and/or Wo Contrast  Result Date: 06/21/2020 CLINICAL DATA:  Abdominal pain, aortic dissection suspected. weakness and a-fib. heart cath performed Wednesday. EXAM: CT ANGIOGRAPHY CHEST, ABDOMEN AND PELVIS TECHNIQUE: Non-contrast CT of the chest was initially obtained. Multidetector CT imaging through the chest,  abdomen and pelvis was performed using the standard protocol during bolus administration of intravenous contrast. Multiplanar reconstructed images and MIPs were obtained and reviewed to evaluate the vascular anatomy. CONTRAST:  187m OMNIPAQUE IOHEXOL 350 MG/ML SOLN COMPARISON:  None. FINDINGS: CTA CHEST FINDINGS Cardiovascular: Satisfactory opacification of the pulmonary arteries to the segmental level. No evidence of pulmonary embolism. Satisfactory opacification of the aorta with no aneurysm or dissection. Mild cardiomegaly. Question patent foramen ovale. No pericardial effusion. Four-vessel coronary artery calcifications. Mitral annular calcifications at least moderate. Mediastinum/Nodes: Multiple borderline enlarged mediastinal lymph nodes: 0.9 cm right paratracheal (7:45). Prominent left hilar lymph nodes. Likely enlarged right hilar lymph node: 1.3 cm (7:67). No enlarged axillary lymph nodes. Thyroid gland, trachea, and esophagus demonstrate no significant findings. Lungs/Pleura: Bilateral lower lobe subsegmental atelectasis. Few scattered calcified and noncalcified pulmonary micronodules (6:34, levin). No pulmonary mass. No focal consolidation. No pleural effusion. No pneumothorax. 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 Diffuse bronchial wall thickening. Musculoskeletal: No chest wall abnormality. No suspicious lytic or blastic osseous lesions. No acute displaced fracture. Multilevel degenerative changes of the spine. Degenerative changes of bilateral shoulders. Review of the MIP images confirms the above findings. CTA ABDOMEN AND PELVIS FINDINGS VASCULAR Aorta: At least moderate atherosclerotic plaque. Normal caliber  aorta without aneurysm, dissection, vasculitis or significant stenosis. 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 Patent without evidence of aneurysm, dissection, vasculitis or  significant stenosis. SMA: Patent without evidence of aneurysm, dissection, vasculitis or significant stenosis. Renals: Atherosclerotic plaque. Both renal arteries are patent without evidence of aneurysm, dissection, vasculitis, fibromuscular dysplasia or significant stenosis. IMA: Patent without evidence of aneurysm, dissection, vasculitis or significant stenosis. Inflow: Patent without evidence of aneurysm, dissection, vasculitis or significant stenosis. Veins: No obvious venous abnormality within the limitations of this arterial phase study. Review of the MIP images confirms the above findings. NON-VASCULAR Hepatobiliary: No focal liver abnormality. No gallstones, gallbladder wall thickening, or pericholecystic fluid. No biliary dilatation. Pancreas: No focal lesion. Normal pancreatic contour. No surrounding inflammatory changes. No main pancreatic ductal dilatation. Spleen: Normal in size without focal abnormality. Adrenals/Urinary Tract: No adrenal nodule bilaterally. Bilateral kidneys enhance symmetrically. No hydronephrosis. No hydroureter. The urinary bladder is unremarkable. Stomach/Bowel: Stomach is within normal limits. No evidence of bowel wall thickening or dilatation. Scattered colonic diverticulosis. Appendix appears normal. Lymphatic: No lymphadenopathy. Reproductive: Uterus and bilateral adnexa are unremarkable. Other: No intraperitoneal free fluid. No intraperitoneal free gas. No organized fluid collection. Musculoskeletal: No abdominal wall hernia or abnormality. No suspicious lytic or blastic osseous lesions. No acute displaced fracture. Multilevel degenerative changes of the spine. Review of the MIP images confirms the above findings. IMPRESSION: 1. No acute aortic abnormality. Aortic Atherosclerosis (ICD10-I70.0) including four-vessel coronary artery calcifications and moderate mitral annular calcifications. 2. No central or segmental pulmonary embolus. 3. Nonspecific right hilar  lymphadenopathy and prominent left hilar/mediastinal lymph nodes. Findings may be reactive in etiology. Recommend attention on follow-up. 4. 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 Correlation with prior cross-sectional imaging would be of value. Recommend attention on follow-up. 5. Mild cardiomegaly with question of patent foramen ovale. 6. Scattered colonic diverticulosis with no acute diverticulitis. Electronically Signed   By: Iven Finn M.D.   On: 06/21/2020 04:21   VAS US CAROTID  Result Date: 06/22/2020 Carotid Arterial Duplex Study Patient Name:  Briana Foster  Date of Exam:   06/22/2020 Medical Rec #: [id number]         Accession #:    [id number] Date of Birth: 1946/01/31        Patient Gender: F Patient Age:   74Y Exam Location:  [facility] Procedure:      VAS US CAROTID Referring Phys: [id number] [place] --------------------------------------------------------------------------------  Indications:       Severe aortic stenosis. Risk Factors:      Hypertension, hyperlipidemia, Diabetes. Comparison Study:  no prior Performing Technologist: Abram Sander RVS  Examination Guidelines: A complete evaluation includes B-mode imaging, spectral Doppler, color Doppler, and power Doppler as needed of all accessible portions of each vessel. Bilateral testing is considered an integral part of a complete examination. Limited examinations for reoccurring indications may be performed as noted.  Right Carotid Findings: +----------+--------+--------+--------+-------------------------+--------+           PSV cm/sEDV cm/sStenosisPlaque Description       Comments +----------+--------+--------+--------+-------------------------+--------+ CCA Prox  44      13              heterogenous                      +----------+--------+--------+--------+-------------------------+--------+ CCA Distal68      26  heterogenous and calcific          +----------+--------+--------+--------+-------------------------+--------+ ICA Prox  257     96      60-79%  heterogenous and calcific         +----------+--------+--------+--------+-------------------------+--------+ ICA Mid   168     78                                                +----------+--------+--------+--------+-------------------------+--------+ ICA Distal103     36                                                +----------+--------+--------+--------+-------------------------+--------+ ECA       86      17                                                +----------+--------+--------+--------+-------------------------+--------+ +----------+--------+-------+--------+-------------------+           PSV cm/sEDV cmsDescribeArm Pressure (mmHG) +----------+--------+-------+--------+-------------------+ Subclavian80                                         +----------+--------+-------+--------+-------------------+ +---------+--------+--+--------+--+---------+ VertebralPSV cm/s70EDV cm/s18Antegrade +---------+--------+--+--------+--+---------+  Left Carotid Findings: +----------+--------+--------+--------+------------------+--------+           PSV cm/sEDV cm/sStenosisPlaque DescriptionComments +----------+--------+--------+--------+------------------+--------+ CCA Prox  92      29              heterogenous               +----------+--------+--------+--------+------------------+--------+ CCA Distal77      18              heterogenous               +----------+--------+--------+--------+------------------+--------+ ICA Prox  50      19      1-39%   heterogenous               +----------+--------+--------+--------+------------------+--------+ ICA Distal52      21                                         +----------+--------+--------+--------+------------------+--------+ ECA       55      12                                          +----------+--------+--------+--------+------------------+--------+ +----------+--------+--------+--------+-------------------+           PSV cm/sEDV cm/sDescribeArm Pressure (mmHG) +----------+--------+--------+--------+-------------------+ [id number]                                          +----------+--------+--------+--------+-------------------+ +---------+--------+--+--------+--+---------+ VertebralPSV cm/s47EDV cm/s23Antegrade +---------+--------+--+--------+--+---------+   Summary: Right Carotid: Velocities in the right ICA are consistent with a 60-79%  stenosis. Left Carotid: Velocities in the left ICA are consistent with a 1-39% stenosis. Vertebrals: Bilateral vertebral arteries demonstrate antegrade flow. *See table(s) above for measurements and observations.  Electronically signed by Monica Martinez MD on 06/22/2020 at 3:36:32 PM.    Final    ECHOCARDIOGRAM LIMITED  Result Date: 06/22/2020    ECHOCARDIOGRAM LIMITED REPORT   Patient Name:   Briana Foster Date of Exam: 06/22/2020 Medical Rec #:  [id number]        Height:       68.0 in Accession #:    [id number]       Weight:       212.1 lb Date of Birth:  Nov 09, 1945       BSA:          2.095 m Patient Age:    5 years         BP:           121/95 mmHg Patient Gender: F                HR:           54 bpm. Exam Location:  Inpatient Procedure: Limited Echo, Cardiac Doppler and Limited Color Doppler Indications:    I35.0 Nonrheumatic aortic (valve) stenosis  History:        Patient has prior history of Echocardiogram examinations, most                 recent 05/11/2020. Cardiomegaly, Signs/Symptoms:Murmur; Risk                 Factors:Hypertension, Diabetes, Dyslipidemia and GERD.  Sonographer:    Jonelle Sidle Dance Referring Phys: [place]  1. Left ventricular ejection fraction, by estimation, is 55%. The left ventricle has no regional wall motion abnormalities. There is mild left ventricular  hypertrophy. Left ventricular diastolic parameters are indeterminate.  2. Right ventricular systolic function is mildly reduced. The right ventricular size is mildly enlarged. Peak RV-RA gradient 26 mmHg.  3. The mitral valve is abnormal. Mild mitral valve regurgitation. Moderate mitral annular and valvular calcification. Visually, there appears to be mitral stenosis but gradient was not measured. Would consider additional images to assess degree of mitral stenosis.  4. Tricuspid valve regurgitation is moderate to severe.  5. The aortic valve is tricuspid. Aortic valve regurgitation is not visualized. Severe aortic valve stenosis. Aortic valve area, by VTI measures 0.70 cm. Aortic valve mean gradient measures 38.0 mmHg.  6. The IVC was not visualized. FINDINGS  Left Ventricle: Left ventricular ejection fraction, by estimation, is 55%. The left ventricle has no regional wall motion abnormalities. The left ventricular internal cavity size was normal in size. There is mild left ventricular hypertrophy. Left ventricular diastolic parameters are indeterminate. Right Ventricle: The right ventricular size is mildly enlarged. No increase in right ventricular wall thickness. Right ventricular systolic function is mildly reduced. Pericardium: There is no evidence of pericardial effusion. Mitral Valve: The mitral valve is abnormal. There is moderate calcification of the mitral valve leaflet(s). Moderate mitral annular calcification. Mild mitral valve regurgitation. Tricuspid Valve: The tricuspid valve is normal in structure. Tricuspid valve regurgitation is moderate to severe. Aortic Valve: The aortic valve is tricuspid. Aortic valve regurgitation is not visualized. Severe aortic stenosis is present. Aortic valve mean gradient measures 38.0 mmHg. Aortic valve peak gradient measures 58.1 mmHg. Aortic valve area, by VTI measures  0.70 cm. Pulmonic Valve: The pulmonic valve was normal in structure. Pulmonic valve regurgitation  is  trivial. Aorta: The aortic root is normal in size and structure. Venous: The inferior vena cava was not well visualized. LEFT VENTRICLE PLAX 2D LVIDd:         4.10 cm LVIDs:         3.10 cm LV PW:         1.40 cm LV IVS:        1.00 cm LVOT diam:     2.00 cm LV SV:         64 LV SV Index:   30 LVOT Area:     3.14 cm  LEFT ATRIUM         Index LA diam:    4.50 cm 2.15 cm/m  AORTIC VALVE AV Area (Vmax):    0.75 cm AV Area (Vmean):   0.71 cm AV Area (VTI):     0.70 cm AV Vmax:           381.20 cm/s AV Vmean:          273.000 cm/s AV VTI:            0.910 m AV Peak Grad:      58.1 mmHg AV Mean Grad:      38.0 mmHg LVOT Vmax:         90.60 cm/s LVOT Vmean:        61.900 cm/s LVOT VTI:          0.203 m LVOT/AV VTI ratio: 0.22  AORTA Ao Root diam: 2.80 cm Ao Asc diam:  3.20 cm TRICUSPID VALVE TR Peak grad:   26.6 mmHg TR Vmax:        258.00 cm/s  SHUNTS Systemic VTI:  0.20 m Systemic Diam: 2.00 cm Loralie Champagne MD Electronically signed by Loralie Champagne MD Signature Date/Time: 06/22/2020/9:08:39 PM    Final     Assessment and Plan:   JANIE CAPP is a 75 y.o. female with symptoms of severe, stage D3 aortic stenosis with NYHA Class III symptoms, symptoms exacerbated with atrial fibrillation with RVR. I have reviewed the patient's echocardiogram from 05/11/20 which is notable for persevered LV systolic function and severe aortic stenosis with peak gradient of 3m hg and mean transvalvular gradient of 292mg. The patient's dimensionless index is 0.23 and calculated aortic valve area is 0.79 cm. This study was done while the pt was in afib with RVR. Repeat limited echo this admission actually showed much higher gradients with stage D1 severe AS: EF 55%, mild LVH, mild RV dysfunction/enlargement, moderate MAC with mild MR and possible MS, moderate to severe TR and severe AS with a mean gradient of 38 mm hg, AVA 0.70cm2, DVI 0.22.   She underwent diagnostic L/RHC on 06/17/20 which showed widely patent coronary  arteries (vessels tortuous but with no significant obstruction). She was also noted to be in afib with RVR with rates varying between 101-160 bpm in the setting of not taking her oral medications that morning. She was treated with several doses of IV metoprolol. Variable RR interval made it difficult to appropriately assess left ventricular gradient. "Ey[place]all" peak to peak gradient is 20 mmHg. AV coronary and mitral annular calcification noted on cine fluoroscopy. Cardiac output by Fick 4.82 L/min with index 1.99 L/min/m.  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  Calculated aortic valve area 1.0 cm based upon a mean gradient of 14 mmHg.   Carotid dopplers showed 01-31-73%ICA stenosis and 6010-25%ICA stenosis. Cardiac gated CTA of  the heart reveals anatomical characteristics consistent with aortic stenosis suitable for treatment by transcatheter aortic valve replacement without any significant complicating features (aside from some bulky calcification noted on the [place]) and CTA of the aorta and iliac vessels demonstrate what appear to be adequate pelvic vascular access to facilitate a transfemoral approach (needs to be reviewed by MD).     I have reviewed the natural history of aortic stenosis with the patient. We have discussed the limitations of medical therapy and the poor prognosis associated with symptomatic aortic stenosis. We have reviewed potential treatment options, including palliative medical therapy, conventional surgical aortic valve replacement, and transcatheter aortic valve replacement. We discussed treatment options in the context of this patient's specific comorbid medical conditions.    The patient's predicted risk of mortality with conventional aortic valve replacement is 2.308% primarily based on age, obesity, afib, HTN, DM, carotid stenosis and moderate to severe TR. Other significant comorbid conditions include rheumatoid arthritis  and mild frailty (PT assessment rated her as a 4). The patient understands her options and says she she would adamantly refuse surgery if offered. I think she would have a difficult time recovering from conventional surgery given her above com morbidities and that TAVR would be a reasonable alternative.   Potential TAVR surgery dates include 5/31 or 6/21 depending on physician scheduling. Will discuss with Dr. Roxy Manns after he has seen the patient.   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  The patient's atrial fibrillation has been difficult to manage medically with fluctuations in heart rate consistent with tachybradycardia syndrome.  She has been evaluated by the EP service who feel that an attempt at catheter-based ablation is probably not warranted.  Consideration of AV node ablation and permanent pacemaker placement has not been recommended at this time.  The patient states that since she has been in the hospital and medications adjusted she is feeling better.  She describes stable symptoms of exertional shortness of breath that occur with more strenuous exertion, consistent with chronic diastolic congestive heart failure, New York Heart Association functional  class II.  Her daughter reports that intermittently at home she has episodes where her breathing gets much worse.  I personally reviewed the patient's recent transthoracic echocardiogram, diagnostic cardiac catheterization, and CT angiograms.  Echocardiogram reveals severe aortic stenosis.  Left ventricular ejection fraction is estimated 55 to 60%.  The aortic valve is trileaflet with severe thickening, calcification, and restricted leaflet mobility involving all 3 leaflets of the aortic valve.  Peak velocity across aortic valve range considerably but was measured at size 2.8 m/s corresponding to mean transvalvular gradient estimated 29 mmHg but aortic valve area calculated only 0.73 cm by VTI.  The DVI was notably quite Foster at 0.23 and stroke-volume index only 19.  Diagnostic cardiac catheterization reveals no significant coronary artery disease.  Calculated valve area by catheterization  was 1.0 cm with mean transvalvular gradient 14 mmHg.  Gated CT angiogram of the heart confirmed the presence of severe calcification of the aortic valve with severely restricted leaflet motion in systole.  The overall size of the aortic valve and aortic root is relatively small with annular area measured 374 mm.   CTA of the aorta and iliac vessels demonstrate what appears to be adequate pelvic vascular access to facilitate a transfemoral approach.  I agree the patient probably has stage D3 severe paradoxical Foster flow Foster gradient aortic stenosis and she would potentially benefit from aortic valve replacement.  However, I would be reluctant to consider this patient a candidate for conventional surgery because of her advanced age and comorbid medical problems with significant physical deconditioning.  Moreover, conventional surgery would clearly require aortic root enlargement or root replacement to avoid the development of patient prosthesis mismatch.  The patient would likely best be treated with transcatheter aortic valve  replacement.  The patient and her daughter were counseled at length regarding treatment alternatives for management of severe symptomatic aortic stenosis. Alternative approaches such as conventional aortic valve replacement, transcatheter aortic valve replacement, and continued medical therapy without intervention were compared and contrasted at length.  The risks associated with conventional surgical aortic valve replacement were discussed in detail, as were expectations for post-operative convalescence, and why I would be reluctant to consider this patient a candidate for conventional surgery.  Issues specific to transcatheter aortic valve replacement were discussed including questions about long term valve durability, the potential for paravalvular leak, possible increased risk of need for permanent pacemaker placement, and other technical complications related to the procedure itself.  Long-term prognosis with medical therapy was discussed. This discussion was placed in the context of the patient's own specific clinical presentation and past medical history.  All of their questions have been addressed.  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  We tentatively plan for surgery on Jun 30, 2020.  Following the decision to proceed with transcatheter aortic valve replacement, a discussion has been held regarding what types of management strategies would be attempted intraoperatively in the event of life-threatening complications, including whether or not the patient would be considered a candidate for the use of cardiopulmonary bypass and/or conversion to open sternotomy for attempted surgical intervention.  The patient specifically requests that should a potentially life-threatening complication develop we would attempt emergency median sternotomy and/or other aggressive surgical procedures.  The patient has been advised of a variety of complications that  might develop including but not limited to risks of death, stroke, paravalvular leak, aortic dissection or other major vascular complications, aortic annulus rupture, device embolization, cardiac rupture or perforation, mitral regurgitation, acute myocardial infarction, arrhythmia, heart block or bradycardia requiring permanent pacemaker placement, congestive heart failure, respiratory failure, renal failure, pneumonia, infection, other late complications related to structural valve deterioration or migration, or other complications that might ultimately cause a temporary or permanent loss of functional independence or other long term morbidity.  The patient provides full informed consent for the procedure as described and all questions were answered.    I spent in excess of 90 minutes during the conduct of this hospital encounter and >50% of this time involved direct face-to-face encounter with the patient for counseling and/or coordination of their care.     Rexene Alberts, MD 06/24/2020 3:53 PM

## 2020-06-24 DIAGNOSIS — I350 Nonrheumatic aortic (valve) stenosis: Secondary | ICD-10-CM

## 2020-06-24 DIAGNOSIS — I4821 Permanent atrial fibrillation: Secondary | ICD-10-CM

## 2020-06-24 LAB — GLUCOSE, CAPILLARY
Glucose-Capillary: 148 mg/dL — ABNORMAL HIGH (ref 70–99)
Glucose-Capillary: 200 mg/dL — ABNORMAL HIGH (ref 70–99)
Glucose-Capillary: 147 mg/dL — ABNORMAL HIGH (ref 70–99)

## 2020-06-24 MED ORDER — METOPROLOL TARTRATE 50 MG PO TABS: 50.0000 mg | ORAL_TABLET | Freq: Two times a day (BID) | ORAL | 0 refills | Status: DC

## 2020-06-24 NOTE — Telephone Encounter (Signed)
Left message to call back  

## 2020-06-24 NOTE — Progress Notes (Signed)
Mobility Specialist: Progress Note   06/24/20 1059  Mobility  Activity Ambulated in hall  Level of Assistance Contact guard assist, steadying assist  Assistive Device None  Distance Ambulated (ft) 470 ft  Mobility Ambulated with assistance in hallway  Mobility Response Tolerated well  Mobility performed by Mobility specialist  $Mobility charge 1 Mobility   Pre-Mobility: 65 HR During Mobility: 92 HR, 98% SpO2 Post-Mobility: 84 HR, 147/67 BP, 98% SpO2  Pt required contact guard using gait belt to assist with steadying. Pt unsteady during ambulation and experienced LOB x1 requiring minA to recover, RN notified. Pt otherwise asx and is back in bed with call bell at her side.   [facility] Briana Foster Mobility Specialist Mobility Specialist Phone: [phone]

## 2020-06-24 NOTE — Discharge Summary (Signed)
Discharge Summary    Patient ID: Briana Foster MRN: [id number]; DOB: 1945/10/16  Admit date: 06/20/2020 Discharge date: 06/24/2020  PCP:  Wenda Low, MD   [facility] Providers Cardiologist:  Sinclair Grooms, MD  Electrophysiologist:  Vickie Epley, MD  {    Discharge Diagnoses    Principal Problem:   Atrial fibrillation with RVR [facility]) Active Problems:   Class 1 obesity with serious comorbidity and body mass index (BMI) of 31.0 to 31.9 in adult   Essential hypertension   Type 2 diabetes mellitus without complication, without long-term current use of insulin (HCC)   Aortic valve stenosis   Rheumatoid arthritis ([place])   Restless leg syndrome   Atrial fibrillation [facility])    Diagnostic Studies/Procedures    Carotid doppler 06/22/20 Summary:  Right Carotid: Velocities in the right ICA are consistent with a 60-79%         stenosis.   Left Carotid: Velocities in the left ICA are consistent with a 1-39%  stenosis.   Vertebrals: Bilateral vertebral arteries demonstrate antegrade flow.   Echo 06/22/20 1. Left ventricular ejection fraction, by estimation, is 55%. The left  ventricle has no regional wall motion abnormalities. There is mild left  ventricular hypertrophy. Left ventricular diastolic parameters are  indeterminate.  2. Right ventricular systolic function is mildly reduced. The right  ventricular size is mildly enlarged. Peak RV-RA gradient 26 mmHg.  3. The mitral valve is abnormal. Mild mitral valve regurgitation.  Moderate mitral annular and valvular calcification. Visually, there  appears to be mitral stenosis but gradient was not measured. Would  consider additional images to assess degree of  mitral stenosis.  4. Tricuspid valve regurgitation is moderate to severe.  5. The aortic valve is tricuspid. Aortic valve regurgitation is not  visualized. Severe aortic valve stenosis. Aortic valve area, by VTI  measures 0.70 cm. Aortic  valve mean gradient measures 38.0 mmHg.  6. The IVC was not visualized.    History of Present Illness     Briana Foster is a 75 y.o. female with permanent atrial fibrillation, aortic stenosis, , anemia, HLD, RA, GERD, HTN, carotid artery dz, and DM2 presenting with fatigue/generalized weakness with severe AS.  Saw Dr. Quentin Ore 02/25/20, he described her as permanent AFib, noting she had previously been tried and failed to maintain SR on amiodarone) at that time her meds further adjusted (increased metoprolol to 14m PO BID from 773m and increased cardizem to 18056mO BID from daily) for better HR control with plans for a 3 mo follow up and repeat echo to monitor her AS.  Echo April 2022 with LVEF=55-60%, mild LVH. Moderate mitral annular calcification with trivial mitral regurgitation. The aortic valve leaflets are thickened and calcified with limited leaflet excursion. Mean gradient 29 mmHg, peak gradient 31 mmHg, AVA 0.73 cm2. Dimensionless index 0.23. SVI 19. This is consistent with paradoxical low flow/low gradient severe aortic stenosis. She was in rapid atrial fibrillation during the echo. She has persistent atrial fibrillation and is on Eliquis.   She was seen by Dr. SmiTamala Julian10/22 and c/o exertional dyspnea and fatigue that has progressively worsened over the past six months.  [facility]  May 18,2022 showed revealed patent coronaries and paradoxical low flow/low gradient severe AS. She was in afib RVR during cath as she did not took her rate controlled medications prior to arrival. She was treated with IV lopressor with improved rate  during cath.   She felt back to normal for few days after discharge however presented to ER 5/22 with profound fatigue and weakness on Saturday morning without any inciting cause.  During EMS evaluation she was found to be in A. fib although not in RVR and her blood pressure was normotensive.  She  was not hypoxic at home but was brought in by EMS given the AF with associated weakness.  She was started on diltiazem infusion and amiodarone in ED for rapid a fib and had CTA chest/abd/pelvis negative for PE or acute aortic syndrome. Seen by hospitalist while in ER but symptoms felt due to cardiac issue and signed off.  She was admitted for further work up.   Hospital Course     Consultants: Hospitalist, EP and Structural Heart   1. Permanent atrial fibrillation  - Rate improved after started IV amiodarone and cardizem in ER. However had occasional pauses up to 2 seconds. Had nausea/emesis on amiodarone in past. Stopped Amiodarone. There was concern about rate control agent doses and seen by EP service who recommended changing her metoprolol from 50 mg q6hours to 50 mg q12 hours, as adding back her home diltiazem as 121m BID. Continued Eliquis 565mBID for anticoagulation. Patient continued to have intermittent pause (all less than 3 sec) with stable HR. Hold Eliquis for surgery next week.   2. Severe aortic stenosis - Patient underwent cardiac CT on 5/23 which reveals anatomical characteristics consistent with aortic stenosis suitable for treatment by transcatheter aortic valve replacement without any significant complicating features aside from some bulky calcification noted on the [place] dopplers showed 02-05-44%ICA stenosis and 6027-03%ICA stenosis. CT angio chest/abd/pelvis (done to rule out dissection) in ER reviewed and it appears that she has TF access. Repeat limited echo 06/22/20 showed EF 55%, mild LVH, mild RV dysfunction/enlargement, moderate MAC with mild MR and possible MS, moderate to severe TR and severe AS with a mean gradient of 38 mm hg, AVA 0.70cm2, DVI 0.22. - Seen by structural valve team and plan for TAVR next week. She is instructed to hold Eliquis starting today.   3. Carotid artery disease - Carotid dopplers showed 02-04-98%ICA stenosis and 6093-81%ICA stenosis. -  Continue Crestor 2027md  4. Chronic anemia - Hgb stable  5. HTN - BP stable on rate control agent - Home Amlodipine on held this admission >> will discontinue.  6. DM - SSI while admitted - Resume home metformin at discharge    Did the patient have an acute coronary syndrome (MI, NSTEMI, STEMI, etc) this admission?:  No                               Did the patient have a percutaneous coronary intervention (stent / angioplasty)?:  No.       _____________  Discharge Vitals Blood pressure (!) 123/92, pulse 87, temperature 97.9 F (36.6 C), temperature source Oral, resp. rate 17, height _0  (1.727 m), weight 96 kg, SpO2 94 %.  Filed Weights   06/22/20 0336 06/23/20 0407 06/24/20 0349  Weight: 96.2 kg 96.9 kg 96 kg    Labs & Radiologic Studies    CBC Recent Labs    06/22/20 0239  WBC 8.0  HGB 11.3*  HCT 35.1*  MCV 95.6  PLT 200829Basic Metabolic Panel Recent Labs    06/22/20 0239  NA 137  K 4.0  CL 106  CO2 25  GLUCOSE 154*  BUN 16  CREATININE 0.99  CALCIUM 9.2  MG 1.9   Liver Function Tests No results for input(s): AST, ALT, ALKPHOS, BILITOT, PROT, ALBUMIN in the last 72 hours. No results for input(s): LIPASE, AMYLASE in the last 72 hours. High Sensitivity Troponin:   Recent Labs  Lab 06/21/20 0002 06/21/20 0427  TROPONINIHS 8 7    BNP Invalid input(s): POCBNP D-Dimer No results for input(s): DDIMER in the last 72 hours. Hemoglobin A1C No results for input(s): HGBA1C in the last 72 hours. Fasting Lipid Panel No results for input(s): CHOL, HDL, LDLCALC, TRIG, CHOLHDL, LDLDIRECT in the last 72 hours. Thyroid Function Tests No results for input(s): TSH, T4TOTAL, T3FREE, THYROIDAB in the last 72 hours.  Invalid input(s): FREET3 _____________  CARDIAC CATHETERIZATION  Result Date: 06/17/2020  Very poor rate control related to patient not taking medications.  Atrial fibrillation with RVR varying between 101 160 bpm.  Therefore, the patient was  treated with IV metoprolol receiving 15 mg and 5 mg doses over 30 minutes before proceeding.  This decrease the heart rate into the 90 to 115 bpm range.  Widely patent coronary arteries.  Vessels are tortuous but have no significant obstruction.  Normal pulmonary artery pressures with mean pulmonary wedge 23 mmHg (influenced by rate).  Variable RR interval made it difficult to appropriately assess left ventricular gradient.  "[place]all" peak to peak gradient is 20 mmHg.  AV coronary and mitral annular calcification noted on cine fluoroscopy.  Cardiac output by Fick 4.82 L/min with index 1.99 L/min/m.  These findings are consistent with potential low-flow low gradient aortic stenosis as suspected.  Calculated aortic valve area 1.0 cm based upon a mean gradient of 14 mmHg. RECOMMENDATIONS:  Back to Dr. Angelena Form for CT imaging and referral to cardiac surgery for second opinion concerning management with TAVR versus SAVR.  CT CORONARY MORPH W/CTA COR W/SCORE W/CA W/CM &/OR WO/CM  Addendum Date: 06/22/2020   ADDENDUM REPORT: 06/22/2020 19:07 CLINICAL DATA:  Severe Aortic Stenosis. EXAM: Cardiac TAVR CT TECHNIQUE: A non-contrast, gated CT scan was obtained with axial slices of 3 mm through the heart for aortic valve calcium scoring. A 120 kV retrospective, gated, contrast cardiac scan was obtained. Gantry rotation speed was 250 msecs and collimation was 0.6 mm. Nitroglycerin was not given. The 3D data set was reconstructed in 5% intervals of the 0-95% of the R-R cycle. Systolic and diastolic phases were analyzed on a dedicated workstation using MPR, MIP, and VRT modes. The patient received 100 cc of contrast. FINDINGS: Image quality: Excellent. Noise artifact is: Limited. Valve Morphology: The aortic valve is tricuspid and severely calcified. There is bulky calcification of the [place] with partial fusion of the NCC/LCC. The leaflets demonstrate severely restricted leaflet motion in systole. Aortic Valve Calcium  score: 1083 Aortic annular dimension: Phase assessed: 25% Annular area: 374 mm2 Annular perimeter: 70.7 mm Max diameter: 25.5 mm Min diameter: 18.3 mm Annular and subannular calcification: None. Optimal coplanar projection: LAO 10 CRA 21 Coronary Artery Height above Annulus: Left Main: 12.5 mm Right Coronary: 15.2 mm Sinus of Valsalva Measurements: Non-coronary: 30 mm Right-coronary: 28 mm Left-coronary: 30 mm Sinus of Valsalva Height: Non-coronary: 21.9 mm Right-coronary: 21.5 mm Left-coronary: 18.0 mm Sinotubular Junction: 25 mm Ascending Thoracic Aorta: 31 mm Coronary Arteries: Normal coronary origin. Right dominance. The study was performed without use of NTG and is insufficient for plaque evaluation. Please refer to recent cardiac catheterization for coronary assessment. Cardiac Morphology: Right Atrium:  Right atrial size is dilated. Right Ventricle: The right ventricular cavity is within normal limits. Left Atrium: Left atrial size is dilated. There is mixing artifact in the LAA, cannot exclude thrombus. Left Ventricle: The ventricular cavity size is within normal limits. There are no stigmata of prior infarction. There is no abnormal filling defect. Normal left ventricular function, EF=59%. No regional wall motion abnormalities. Pulmonary arteries: Normal in size without proximal filling defect. Pulmonary veins: Normal pulmonary venous drainage. Pericardium: Normal thickness with no significant effusion or calcium present. Mitral Valve: The mitral valve is degenerative with moderate to severe annular calcification. Extra-cardiac findings: See attached radiology report for non-cardiac structures. IMPRESSION: 1. 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 Bulky calcification noted on the [place]. 2. Annular measurements appropriate for 23 mm Sapien 3 TAVR (374 mm2). 3. No significant annular or subannular calcifications. 4. Sufficient coronary to annulus distance. 5. Optimal Fluoroscopic Angle for  Delivery: LAO 10 CRA 21 6. There is mixing artifact in the LAA, cannot exclude thrombus. 7. The mitral valve is degenerative with moderate to severe annular calcification. [place] T. Audie Box, MD Electronically Signed   By: Eleonore Chiquito   On: 06/22/2020 19:07   Result Date: 06/22/2020 EXAM: OVER-READ INTERPRETATION  CT CHEST The following report is an over-read performed by radiologist Dr. Vinnie Langton of [facility], [place] on 06/22/2020. This over-read does not include interpretation of cardiac or coronary anatomy or pathology. The coronary calcium score/coronary CTA interpretation by the cardiologist is attached. COMPARISON:  None. 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 Within the visualized portions of the thorax there are no suspicious appearing pulmonary nodules or masses, there is no acute consolidative airspace disease, no pleural effusions, no pneumothorax and no lymphadenopathy. Visualized portions of the upper abdomen are unremarkable. There are no aggressive appearing lytic or blastic lesions noted in the visualized portions of the skeleton. IMPRESSION: 1. 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 Electronically Signed: By: Vinnie Langton M.D. On: 06/22/2020 16:27   DG Chest Portable 1 View  Result Date: 06/21/2020 CLINICAL DATA:  Shortness of breath. EXAM: PORTABLE CHEST 1 VIEW COMPARISON:  December 07, 2016 FINDINGS: Mild, chronic appearing increased lung markings are seen without evidence of focal consolidation, pleural effusion or pneumothorax. Very mild peribronchial thickening is noted within the perihilar regions, bilaterally. The cardiac silhouette is mildly enlarged. The visualized skeletal structures are unremarkable. IMPRESSION: Mild bilateral peribronchial thickening  without an acute infiltrate. Electronically Signed   By: Virgina Norfolk M.D.   On: 06/21/2020 00:18   CT Angio Chest/Abd/Pel for Dissection W and/or Wo Contrast  Result Date: 06/21/2020 CLINICAL DATA:  Abdominal pain, aortic dissection suspected. weakness and a-fib. heart cath performed Wednesday. EXAM: CT ANGIOGRAPHY CHEST, ABDOMEN AND PELVIS TECHNIQUE: Non-contrast CT of the chest was initially obtained. Multidetector CT imaging through the chest, abdomen and pelvis was performed using the standard protocol during bolus administration of intravenous contrast. Multiplanar reconstructed images and MIPs were obtained and reviewed to evaluate the vascular anatomy. CONTRAST:  12m OMNIPAQUE IOHEXOL 350 MG/ML SOLN COMPARISON:  None. FINDINGS: CTA CHEST FINDINGS Cardiovascular: Satisfactory opacification of the pulmonary arteries to the segmental level. No evidence of pulmonary embolism. Satisfactory opacification of the aorta with no aneurysm or dissection. Mild cardiomegaly. Question patent foramen ovale. No pericardial effusion. Four-vessel coronary artery calcifications. Mitral annular  calcifications at least moderate. Mediastinum/Nodes: Multiple borderline enlarged mediastinal lymph nodes: 0.9 cm right paratracheal (7:45). Prominent left hilar lymph nodes. Likely enlarged right hilar lymph node: 1.3 cm (7:67). No enlarged axillary lymph nodes. Thyroid gland, trachea, and esophagus demonstrate no significant findings. Lungs/Pleura: Bilateral lower lobe subsegmental atelectasis. Few scattered calcified and noncalcified pulmonary micronodules (6:34, levin). No pulmonary mass. No focal consolidation. No pleural effusion. No pneumothorax. 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 Diffuse bronchial wall thickening. Musculoskeletal: No chest wall abnormality. No suspicious lytic or blastic osseous lesions. No acute displaced fracture. Multilevel degenerative changes of  the spine. Degenerative changes of bilateral shoulders. Review of the MIP images confirms the above findings. CTA ABDOMEN AND PELVIS FINDINGS VASCULAR Aorta: At least moderate atherosclerotic plaque. Normal caliber aorta without aneurysm, dissection, vasculitis or significant stenosis. 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 Patent without evidence of aneurysm, dissection, vasculitis or significant stenosis. SMA: Patent without evidence of aneurysm, dissection, vasculitis or significant stenosis. Renals: Atherosclerotic plaque. Both renal arteries are patent without evidence of aneurysm, dissection, vasculitis, fibromuscular dysplasia or significant stenosis. IMA: Patent without evidence of aneurysm, dissection, vasculitis or significant stenosis. Inflow: Patent without evidence of aneurysm, dissection, vasculitis or significant stenosis. Veins: No obvious venous abnormality within the limitations of this arterial phase study. Review of the MIP images confirms the above findings. NON-VASCULAR Hepatobiliary: No focal liver abnormality. No gallstones, gallbladder wall thickening, or pericholecystic fluid. No biliary dilatation. Pancreas: No focal lesion. Normal pancreatic contour. No surrounding inflammatory changes. No main pancreatic ductal dilatation. Spleen: Normal in size without focal abnormality. Adrenals/Urinary Tract: No adrenal nodule bilaterally. Bilateral kidneys enhance symmetrically. No hydronephrosis. No hydroureter. The urinary bladder is unremarkable. Stomach/Bowel: Stomach is within normal limits. No evidence of bowel wall thickening or dilatation. Scattered colonic diverticulosis. Appendix appears normal. Lymphatic: No lymphadenopathy. Reproductive: Uterus and bilateral adnexa are unremarkable. Other: No intraperitoneal free fluid. No intraperitoneal free gas. No organized fluid collection. Musculoskeletal: No abdominal wall hernia or  abnormality. No suspicious lytic or blastic osseous lesions. No acute displaced fracture. Multilevel degenerative changes of the spine. Review of the MIP images confirms the above findings. IMPRESSION: 1. No acute aortic abnormality. Aortic Atherosclerosis (ICD10-I70.0) including four-vessel coronary artery calcifications and moderate mitral annular calcifications. 2. No central or segmental pulmonary embolus. 3. Nonspecific right hilar lymphadenopathy and prominent left hilar/mediastinal lymph nodes. Findings may be reactive in etiology. Recommend attention on follow-up. 4. 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 Correlation with prior cross-sectional imaging would be of value. Recommend attention on follow-up. 5. Mild cardiomegaly with question of patent foramen ovale. 6. Scattered colonic diverticulosis with no acute diverticulitis. Electronically Signed   By: Iven Finn M.D.   On: 06/21/2020 04:21   VAS US CAROTID  Result Date: 06/22/2020 Carotid Arterial Duplex Study Patient Name:  JOHNYE KIST  Date of Exam:   06/22/2020 Medical Rec #: [id number]         Accession #:    [id number] Date of Birth: [birth year]        Patient Gender: F Patient Age:   74Y [facility]am [facility] Procedure:      VAS US CAROTID Referring Phys: [id number] [place] --------------------------------------------------------------------------------  Indications:       Severe aortic stenosis. Risk Factors:      Hypertension, hyperlipidemia, Diabetes. Comparison Study:  no prior Performing Technologist:  Abram Sander RVS  Examination Guidelines: A complete evaluation includes B-mode imaging, spectral Doppler, color Doppler, and power Doppler as needed of all accessible portions of each vessel. Bilateral testing is considered an integral part of a complete examination. Limited examinations for reoccurring indications may be performed as noted.  Right Carotid Findings:  +----------+--------+--------+--------+-------------------------+--------+           PSV cm/sEDV cm/sStenosisPlaque Description       Comments +----------+--------+--------+--------+-------------------------+--------+ CCA Prox  44      13              heterogenous                      +----------+--------+--------+--------+-------------------------+--------+ CCA Distal68      26              heterogenous and calcific         +----------+--------+--------+--------+-------------------------+--------+ ICA Prox  257     96      60-79%  heterogenous and calcific         +----------+--------+--------+--------+-------------------------+--------+ ICA Mid   168     78                                                +----------+--------+--------+--------+-------------------------+--------+ ICA Distal103     36                                                +----------+--------+--------+--------+-------------------------+--------+ ECA       86      17                                                +----------+--------+--------+--------+-------------------------+--------+ +----------+--------+-------+--------+-------------------+           PSV cm/sEDV cmsDescribeArm Pressure (mmHG) +----------+--------+-------+--------+-------------------+ Subclavian80                                         +----------+--------+-------+--------+-------------------+ +---------+--------+--+--------+--+---------+ VertebralPSV cm/s70EDV cm/s18Antegrade +---------+--------+--+--------+--+---------+  Left Carotid Findings: +----------+--------+--------+--------+------------------+--------+           PSV cm/sEDV cm/sStenosisPlaque DescriptionComments +----------+--------+--------+--------+------------------+--------+ CCA Prox  92      29              heterogenous               +----------+--------+--------+--------+------------------+--------+ CCA Distal77      18               heterogenous               +----------+--------+--------+--------+------------------+--------+ ICA Prox  50      19      1-39%   heterogenous               +----------+--------+--------+--------+------------------+--------+ ICA Distal52      21                                         +----------+--------+--------+--------+------------------+--------+  ECA       55      12                                         +----------+--------+--------+--------+------------------+--------+ +----------+--------+--------+--------+-------------------+           PSV cm/sEDV cm/sDescribeArm Pressure (mmHG) +----------+--------+--------+--------+-------------------+ [id number]                                          +----------+--------+--------+--------+-------------------+ +---------+--------+--+--------+--+---------+ VertebralPSV cm/s47EDV cm/s23Antegrade +---------+--------+--+--------+--+---------+   Summary: Right Carotid: Velocities in the right ICA are consistent with a 60-79%                stenosis. Left Carotid: Velocities in the left ICA are consistent with a 1-39% stenosis. Vertebrals: Bilateral vertebral arteries demonstrate antegrade flow. *See table(s) above for measurements and observations.  Electronically signed by Monica Martinez MD on 06/22/2020 at 3:36:32 PM.    Final    ECHOCARDIOGRAM LIMITED  Result Date: 06/22/2020    ECHOCARDIOGRAM LIMITED REPORT   Patient Name:   BRENISHA TSUI Date of Exam: 06/22/2020 Medical Rec #:  [id number]        Height:       68.0 in Accession #:    [id number]       Weight:       212.1 lb Date of Birth:  [birth year]       BSA:          2.095 m Patient Age:    89 years         BP:           121/95 mmHg Patient Gender: F                HR:           54 bpm. Exam Location:  Inpatient Procedure: Limited Echo, Cardiac Doppler and Limited Color Doppler Indications:    I35.0 Nonrheumatic aortic (valve) stenosis  History:         Patient has prior history of Echocardiogram examinations, most                 recent 05/11/2020. Cardiomegaly, Signs/Symptoms:Murmur; Risk                 Factors:Hypertension, Diabetes, Dyslipidemia and GERD.  Sonographer:    Jonelle Sidle Dance Referring Phys: [place]  1. Left ventricular ejection fraction, by estimation, is 55%. The left ventricle has no regional wall motion abnormalities. There is mild left ventricular hypertrophy. Left ventricular diastolic parameters are indeterminate.  2. Right ventricular systolic function is mildly reduced. The right ventricular size is mildly enlarged. Peak RV-RA gradient 26 mmHg.  3. The mitral valve is abnormal. Mild mitral valve regurgitation. Moderate mitral annular and valvular calcification. Visually, there appears to be mitral stenosis but gradient was not measured. Would consider additional images to assess degree of mitral stenosis.  4. Tricuspid valve regurgitation is moderate to severe.  5. The aortic valve is tricuspid. Aortic valve regurgitation is not visualized. Severe aortic valve stenosis. Aortic valve area, by VTI measures 0.70 cm. Aortic valve mean gradient measures 38.0 mmHg.  6. The IVC was not visualized. FINDINGS  Left Ventricle: Left ventricular ejection fraction, by estimation, is 55%. The left ventricle has no regional wall  motion abnormalities. The left ventricular internal cavity size was normal in size. There is mild left ventricular hypertrophy. Left ventricular diastolic parameters are indeterminate. Right Ventricle: The right ventricular size is mildly enlarged. No increase in right ventricular wall thickness. Right ventricular systolic function is mildly reduced. Pericardium: There is no evidence of pericardial effusion. Mitral Valve: The mitral valve is abnormal. There is moderate calcification of the mitral valve leaflet(s). Moderate mitral annular calcification. Mild mitral valve regurgitation. Tricuspid Valve: The  tricuspid valve is normal in structure. Tricuspid valve regurgitation is moderate to severe. Aortic Valve: The aortic valve is tricuspid. Aortic valve regurgitation is not visualized. Severe aortic stenosis is present. Aortic valve mean gradient measures 38.0 mmHg. Aortic valve peak gradient measures 58.1 mmHg. Aortic valve area, by VTI measures  0.70 cm. Pulmonic Valve: The pulmonic valve was normal in structure. Pulmonic valve regurgitation is trivial. Aorta: The aortic root is normal in size and structure. Venous: The inferior vena cava was not well visualized. LEFT VENTRICLE PLAX 2D LVIDd:         4.10 cm LVIDs:         3.10 cm LV PW:         1.40 cm LV IVS:        1.00 cm LVOT diam:     2.00 cm LV SV:         64 LV SV Index:   30 LVOT Area:     3.14 cm  LEFT ATRIUM         Index LA diam:    4.50 cm 2.15 cm/m  AORTIC VALVE AV Area (Vmax):    0.75 cm AV Area (Vmean):   0.71 cm AV Area (VTI):     0.70 cm AV Vmax:           381.20 cm/s AV Vmean:          273.000 cm/s AV VTI:            0.910 m AV Peak Grad:      58.1 mmHg AV Mean Grad:      38.0 mmHg LVOT Vmax:         90.60 cm/s LVOT Vmean:        61.900 cm/s LVOT VTI:          0.203 m LVOT/AV VTI ratio: 0.22  AORTA Ao Root diam: 2.80 cm Ao Asc diam:  3.20 cm TRICUSPID VALVE TR Peak grad:   26.6 mmHg TR Vmax:        258.00 cm/s  SHUNTS Systemic VTI:  0.20 m Systemic Diam: 2.00 cm Loralie Champagne MD Electronically signed by Loralie Champagne MD Signature Date/Time: 06/22/2020/9:08:39 PM    Final    Disposition   Pt is being discharged home today in good condition.  Follow-up Plans & Appointments     Follow-up Information    TVAR surgery next week Follow up.   Why: Office will call tomorrow with instructions               Discharge Medications   Allergies as of 06/24/2020      Reactions   Bee Venom Shortness Of Breath, Rash   Apricot Flavor Swelling   Eyes swell shut   Atorvastatin Other (See Comments)   Wasp Venom Other (See Comments)       Medication List    STOP taking these medications   amiodarone 200 MG tablet Commonly known as: PACERONE   amLODipine 5 MG tablet Commonly known as:  NORVASC   Eliquis 5 MG Tabs tablet Generic drug: apixaban     TAKE these medications   albuterol 108 (90 Base) MCG/ACT inhaler Commonly known as: VENTOLIN HFA Inhale 2 puffs into the lungs every 4 (four) hours as needed for wheezing or shortness of breath.   ALLERGY EYE OP Place 1 drop into both eyes daily as needed (red/itchy eyes).   cetirizine 10 MG tablet Commonly known as: ZYRTEC Take 10 mg by mouth daily as needed for allergies.   diclofenac Sodium 1 % Gel Commonly known as: VOLTAREN Apply 1 application topically 2 (two) times daily as needed (pain).   diltiazem 180 MG 24 hr capsule Commonly known as: CARDIZEM CD Take 1 capsule (180 mg total) by mouth in the morning and at bedtime.   metFORMIN 750 MG 24 hr tablet Commonly known as: GLUCOPHAGE-XR Take 750 mg by mouth daily in the afternoon.   metoprolol tartrate 50 MG tablet Commonly known as: LOPRESSOR Take 1 tablet (50 mg total) by mouth 2 (two) times daily. What changed: when to take this   multivitamin with minerals tablet Take 1 tablet by mouth daily.   pantoprazole 40 MG tablet Commonly known as: PROTONIX Take 40 mg by mouth daily as needed (heartburn).   rOPINIRole 4 MG tablet Commonly known as: REQUIP Take 4 mg by mouth at bedtime.   rosuvastatin 20 MG tablet Commonly known as: CRESTOR Take 20 mg by mouth daily in the afternoon.   vitamin C 500 MG tablet Commonly known as: ASCORBIC ACID Take 500 mg by mouth daily.          Outstanding Labs/Studies   None  Duration of Discharge Encounter   Greater than 30 minutes including physician time.  Mahalia Longest [place], PA 06/24/2020, 4:19 PM

## 2020-06-24 NOTE — Progress Notes (Signed)
Progress Note  Patient Name: Briana Foster Date of Encounter: 06/24/2020  [facility] Cardiologist: Lesleigh Noe, MD  [facility] EP: Steffanie Dunn  Subjective   Doing much better today. Rates well controlled. Ambulated up and down hallway with PT this morning, O2 sats and heart rates stable. Awaiting meeting with Dr. Cornelius Moras prior to scheduling procedure for aortic valve.   Inpatient Medications    Scheduled Meds: . apixaban  5 mg Oral BID  . diltiazem  180 mg Oral BID  . insulin aspart  0-15 Units Subcutaneous TID WC  . metoprolol tartrate  50 mg Oral Q12H  . rOPINIRole  4 mg Oral QHS  . rosuvastatin  20 mg Oral Q1500   Continuous Infusions:  PRN Meds: acetaminophen, albuterol, ondansetron (ZOFRAN) IV, pantoprazole   Vital Signs    Vitals:   06/23/20 1940 06/23/20 2320 06/24/20 0349 06/24/20 0757  BP: (!) 133/104 (!) 145/97 (!) 120/92 (!) 123/92  Pulse: 74 93 100 87  Resp: 16 18 18 17   Temp: 98.6 F (37 C) 98 F (36.7 C) 98 F (36.7 C) 97.9 F (36.6 C)  TempSrc: Oral Oral Oral Oral  SpO2: 92% 90% 92% 94%  Weight:   96 kg   Height:        Intake/Output Summary (Last 24 hours) at 06/24/2020 1105 Last data filed at 06/23/2020 1407 Gross per 24 hour  Intake 240 ml  Output --  Net 240 ml   Last 3 Weights 06/24/2020 06/23/2020 06/22/2020  Weight (lbs) 211 lb 11.2 oz 213 lb 10 oz 212 lb 1.3 oz  Weight (kg) 96.026 kg 96.9 kg 96.2 kg      Telemetry    Atrial fibrillation, around 70s on average Does have occasional pauses up to 2 seconds, asymptomatic and unchanged - Personally Reviewed  ECG    06/21/20 atrial fibrillation at 95 bpm - Personally Reviewed  Physical Exam   GEN: Well nourished, well developed in no acute distress NECK: No JVD CARDIAC: regular rhythm, normal S1 and S2, no rubs or gallops. 3/6 systolic ejection murmur. VASCULAR: Radial pulses 2+ bilaterally.  RESPIRATORY:  Clear to auscultation without rales, wheezing or rhonchi  ABDOMEN:  Soft, non-tender, non-distended MUSCULOSKELETAL:  Moves all 4 limbs independently SKIN: Warm and dry, no edema NEUROLOGIC:  No focal neuro deficits noted. PSYCHIATRIC:  Normal affect   Labs    High Sensitivity Troponin:   Recent Labs  Lab 06/21/20 0002 06/21/20 0427  TROPONINIHS 8 7      Chemistry Recent Labs  Lab 06/21/20 0002 06/21/20 0218 06/22/20 0239  NA 138  --  137  K 6.0* 3.7 4.0  CL 107  --  106  CO2 23  --  25  GLUCOSE 152*  --  154*  BUN 16  --  16  CREATININE 0.99  --  0.99  CALCIUM 9.4  --  9.2  PROT 6.9  --   --   ALBUMIN 3.9  --   --   AST 63*  --   --   ALT 42  --   --   ALKPHOS 67  --   --   BILITOT 1.5*  --   --   GFRNONAA >60  --  >60  ANIONGAP 8  --  6     Hematology Recent Labs  Lab 06/21/20 0002 06/22/20 0239  WBC 10.1 8.0  RBC 3.94 3.67*  HGB 12.5 11.3*  HCT 38.3 35.1*  MCV 97.2 95.6  MCH 31.7 30.8  MCHC 32.6 32.2  RDW 14.1 13.8  PLT 160 200    BNP Recent Labs  Lab 06/21/20 1648  BNP 250.5*     DDimer No results for input(s): DDIMER in the last 168 hours.   Radiology    CT CORONARY MORPH W/CTA COR W/SCORE W/CA W/CM &/OR WO/CM  Addendum Date: 06/22/2020   ADDENDUM REPORT: 06/22/2020 19:07 CLINICAL DATA:  Severe Aortic Stenosis. EXAM: Cardiac TAVR CT TECHNIQUE: A non-contrast, gated CT scan was obtained with axial slices of 3 mm through the heart for aortic valve calcium scoring. A 120 kV retrospective, gated, contrast cardiac scan was obtained. Gantry rotation speed was 250 msecs and collimation was 0.6 mm. Nitroglycerin was not given. The 3D data set was reconstructed in 5% intervals of the 0-95% of the R-R cycle. Systolic and diastolic phases were analyzed on a dedicated workstation using MPR, MIP, and VRT modes. The patient received 100 cc of contrast. FINDINGS: Image quality: Excellent. Noise artifact is: Limited. Valve Morphology: The aortic valve is tricuspid and severely calcified. There is bulky calcification of the  NCC with partial fusion of the NCC/LCC. The leaflets demonstrate severely restricted leaflet motion in systole. Aortic Valve Calcium score: 1083 Aortic annular dimension: Phase assessed: 25% Annular area: 374 mm2 Annular perimeter: 70.7 mm Max diameter: 25.5 mm Min diameter: 18.3 mm Annular and subannular calcification: None. Optimal coplanar projection: LAO 10 CRA 21 Coronary Artery Height above Annulus: Left Main: 12.5 mm Right Coronary: 15.2 mm Sinus of Valsalva Measurements: Non-coronary: 30 mm Right-coronary: 28 mm Left-coronary: 30 mm Sinus of Valsalva Height: Non-coronary: 21.9 mm Right-coronary: 21.5 mm Left-coronary: 18.0 mm Sinotubular Junction: 25 mm Ascending Thoracic Aorta: 31 mm Coronary Arteries: Normal coronary origin. Right dominance. The study was performed without use of NTG and is insufficient for plaque evaluation. Please refer to recent cardiac catheterization for coronary assessment. Cardiac Morphology: Right Atrium: Right atrial size is dilated. Right Ventricle: The right ventricular cavity is within normal limits. Left Atrium: Left atrial size is dilated. There is mixing artifact in the LAA, cannot exclude thrombus. Left Ventricle: The ventricular cavity size is within normal limits. There are no stigmata of prior infarction. There is no abnormal filling defect. Normal left ventricular function, EF=59%. No regional wall motion abnormalities. Pulmonary arteries: Normal in size without proximal filling defect. Pulmonary veins: Normal pulmonary venous drainage. Pericardium: Normal thickness with no significant effusion or calcium present. Mitral Valve: The mitral valve is degenerative with moderate to severe annular calcification. Extra-cardiac findings: See attached radiology report for non-cardiac structures. IMPRESSION: 1. 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 Bulky calcification noted on the NCC. 2. Annular measurements appropriate for 23 mm Sapien 3 TAVR (374 mm2). 3.  No significant annular or subannular calcifications. 4. Sufficient coronary to annulus distance. 5. Optimal Fluoroscopic Angle for Delivery: LAO 10 CRA 21 6. There is mixing artifact in the LAA, cannot exclude thrombus. 7. The mitral valve is degenerative with moderate to severe annular calcification. Gerri Spore T. Flora Lipps, MD Electronically Signed   By: Lennie Odor   On: 06/22/2020 19:07   Result Date: 06/22/2020 EXAM: OVER-READ INTERPRETATION  CT CHEST The following report is an over-read performed by radiologist Dr. Trudie Reed of [facility] on 06/22/2020. This over-read does not include interpretation of cardiac or coronary anatomy or pathology. The coronary calcium score/coronary CTA interpretation by the cardiologist is attached. COMPARISON:  None. 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 Within the visualized portions of the thorax there are no suspicious appearing pulmonary nodules or masses, there is no acute consolidative airspace disease, no pleural effusions, no pneumothorax and no lymphadenopathy. Visualized portions of the upper abdomen are unremarkable. There are no aggressive appearing lytic or blastic lesions noted in the visualized portions of the skeleton. IMPRESSION: 1. 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 Electronically Signed: By: Trudie Reed M.D. On: 06/22/2020 16:27   VAS US CAROTID  Result Date: 06/22/2020 Carotid Arterial Duplex Study Patient Name:  FLORA PARKS  Date of Exam:   06/22/2020 Medical Rec #: [id number]         Accession #:    [id number] Date of Birth: 1945/07/07        Patient Gender: F Patient Age:   74Y Exam Location:  [facility] Procedure:      VAS US CAROTID Referring Phys: [id number] Wille Celeste THOMPSON  --------------------------------------------------------------------------------  Indications:       Severe aortic stenosis. Risk Factors:      Hypertension, hyperlipidemia, Diabetes. Comparison Study:  no prior Performing Technologist: Blanch Media RVS  Examination Guidelines: A complete evaluation includes B-mode imaging, spectral Doppler, color Doppler, and power Doppler as needed of all accessible portions of each vessel. Bilateral testing is considered an integral part of a complete examination. Limited examinations for reoccurring indications may be performed as noted.  Right Carotid Findings: +----------+--------+--------+--------+-------------------------+--------+           PSV cm/sEDV cm/sStenosisPlaque Description       Comments +----------+--------+--------+--------+-------------------------+--------+ CCA Prox  44      13              heterogenous                      +----------+--------+--------+--------+-------------------------+--------+ CCA Distal68      26              heterogenous and calcific         +----------+--------+--------+--------+-------------------------+--------+ ICA Prox  257     96      60-79%  heterogenous and calcific         +----------+--------+--------+--------+-------------------------+--------+ ICA Mid   168     78                                                +----------+--------+--------+--------+-------------------------+--------+ ICA Distal103     36                                                +----------+--------+--------+--------+-------------------------+--------+ ECA       86      17                                                +----------+--------+--------+--------+-------------------------+--------+ +----------+--------+-------+--------+-------------------+           PSV cm/sEDV cmsDescribeArm Pressure (mmHG) +----------+--------+-------+--------+-------------------+ Subclavian80                                          +----------+--------+-------+--------+-------------------+ +---------+--------+--+--------+--+---------+  VertebralPSV cm/s70EDV cm/s18Antegrade +---------+--------+--+--------+--+---------+  Left Carotid Findings: +----------+--------+--------+--------+------------------+--------+           PSV cm/sEDV cm/sStenosisPlaque DescriptionComments +----------+--------+--------+--------+------------------+--------+ CCA Prox  92      29              heterogenous               +----------+--------+--------+--------+------------------+--------+ CCA Distal77      18              heterogenous               +----------+--------+--------+--------+------------------+--------+ ICA Prox  50      19      1-39%   heterogenous               +----------+--------+--------+--------+------------------+--------+ ICA Distal52      21                                         +----------+--------+--------+--------+------------------+--------+ ECA       55      12                                         +----------+--------+--------+--------+------------------+--------+ +----------+--------+--------+--------+-------------------+           PSV cm/sEDV cm/sDescribeArm Pressure (mmHG) +----------+--------+--------+--------+-------------------+ [id number]                                          +----------+--------+--------+--------+-------------------+ +---------+--------+--+--------+--+---------+ VertebralPSV cm/s47EDV cm/s23Antegrade +---------+--------+--+--------+--+---------+   Summary: Right Carotid: Velocities in the right ICA are consistent with a 60-79%                stenosis. Left Carotid: Velocities in the left ICA are consistent with a 1-39% stenosis. Vertebrals: Bilateral vertebral arteries demonstrate antegrade flow. *See table(s) above for measurements and observations.  Electronically signed by Sherald Hess MD on 06/22/2020 at 3:36:32 PM.     Final    ECHOCARDIOGRAM LIMITED  Result Date: 06/22/2020    ECHOCARDIOGRAM LIMITED REPORT   Patient Name:   ERLA BACCHI Date of Exam: 06/22/2020 Medical Rec #:  [id number]        Height:       68.0 in Accession #:    [id number]       Weight:       212.1 lb Date of Birth:  04/27/1945       BSA:          2.095 m Patient Age:    74 years         BP:           121/95 mmHg Patient Gender: F                HR:           54 bpm. Exam Location:  Inpatient Procedure: Limited Echo, Cardiac Doppler and Limited Color Doppler Indications:    I35.0 Nonrheumatic aortic (valve) stenosis  History:        Patient has prior history of Echocardiogram examinations, most                 recent 05/11/2020. Cardiomegaly, Signs/Symptoms:Murmur; Risk  Factors:Hypertension, Diabetes, Dyslipidemia and GERD.  Sonographer:    Elmarie Shiley Dance Referring Phys: [id number] Salvatore Decent KLEIN IMPRESSIONS  1. Left ventricular ejection fraction, by estimation, is 55%. The left ventricle has no regional wall motion abnormalities. There is mild left ventricular hypertrophy. Left ventricular diastolic parameters are indeterminate.  2. Right ventricular systolic function is mildly reduced. The right ventricular size is mildly enlarged. Peak RV-RA gradient 26 mmHg.  3. The mitral valve is abnormal. Mild mitral valve regurgitation. Moderate mitral annular and valvular calcification. Visually, there appears to be mitral stenosis but gradient was not measured. Would consider additional images to assess degree of mitral stenosis.  4. Tricuspid valve regurgitation is moderate to severe.  5. The aortic valve is tricuspid. Aortic valve regurgitation is not visualized. Severe aortic valve stenosis. Aortic valve area, by VTI measures 0.70 cm. Aortic valve mean gradient measures 38.0 mmHg.  6. The IVC was not visualized. FINDINGS  Left Ventricle: Left ventricular ejection fraction, by estimation, is 55%. The left ventricle has no regional wall motion  abnormalities. The left ventricular internal cavity size was normal in size. There is mild left ventricular hypertrophy. Left ventricular diastolic parameters are indeterminate. Right Ventricle: The right ventricular size is mildly enlarged. No increase in right ventricular wall thickness. Right ventricular systolic function is mildly reduced. Pericardium: There is no evidence of pericardial effusion. Mitral Valve: The mitral valve is abnormal. There is moderate calcification of the mitral valve leaflet(s). Moderate mitral annular calcification. Mild mitral valve regurgitation. Tricuspid Valve: The tricuspid valve is normal in structure. Tricuspid valve regurgitation is moderate to severe. Aortic Valve: The aortic valve is tricuspid. Aortic valve regurgitation is not visualized. Severe aortic stenosis is present. Aortic valve mean gradient measures 38.0 mmHg. Aortic valve peak gradient measures 58.1 mmHg. Aortic valve area, by VTI measures  0.70 cm. Pulmonic Valve: The pulmonic valve was normal in structure. Pulmonic valve regurgitation is trivial. Aorta: The aortic root is normal in size and structure. Venous: The inferior vena cava was not well visualized. LEFT VENTRICLE PLAX 2D LVIDd:         4.10 cm LVIDs:         3.10 cm LV PW:         1.40 cm LV IVS:        1.00 cm LVOT diam:     2.00 cm LV SV:         64 LV SV Index:   30 LVOT Area:     3.14 cm  LEFT ATRIUM         Index LA diam:    4.50 cm 2.15 cm/m  AORTIC VALVE AV Area (Vmax):    0.75 cm AV Area (Vmean):   0.71 cm AV Area (VTI):     0.70 cm AV Vmax:           381.20 cm/s AV Vmean:          273.000 cm/s AV VTI:            0.910 m AV Peak Grad:      58.1 mmHg AV Mean Grad:      38.0 mmHg LVOT Vmax:         90.60 cm/s LVOT Vmean:        61.900 cm/s LVOT VTI:          0.203 m LVOT/AV VTI ratio: 0.22  AORTA Ao Root diam: 2.80 cm Ao Asc diam:  3.20 cm TRICUSPID VALVE TR Peak grad:   26.6  mmHg TR Vmax:        258.00 cm/s  SHUNTS Systemic VTI:  0.20 m  Systemic Diam: 2.00 cm Marca Ancona MD Electronically signed by Marca Ancona MD Signature Date/Time: 06/22/2020/9:08:39 PM    Final     Cardiac Studies   RHC/LHC/corrs Result date: 06/17/20  Very poor rate control related to patient not taking medications. Atrial fibrillation with RVR varying between 101 160 bpm. Therefore, the patient was treated with IV metoprolol receiving 15 mg and 5 mg doses over 30 minutes before proceeding. This decrease the heart rate into the 90 to 115 bpm range.  Widely patent coronary arteries. Vessels are tortuous but have no significant obstruction.  Normal pulmonary artery pressures with mean pulmonary wedge 23 mmHg (influenced by rate).  Variable RR interval made it difficult to appropriately assess left ventricular gradient. "Eyeball" peak to peak gradient is 20 mmHg.  AV coronary and mitral annular calcification noted on cine fluoroscopy.  Cardiac output by Fick 4.82 L/min with index 1.99 L/min/m. These findings are consistent with potential low-flow low gradient aortic stenosis as suspected. Calculated aortic valve area 1.0 cm based upon a mean gradient of 14 mmHg.  TTE Result date: 05/11/20 1. Left ventricular ejection fraction, by estimation, is 55 to 60%. The  left ventricle has normal function. The left ventricle has no regional  wall motion abnormalities. There is mild left ventricular hypertrophy.  Left ventricular diastolic parameters  are indeterminate.  2. Right ventricular systolic function is normal. The right ventricular  size is normal. There is normal pulmonary artery systolic pressure. The  estimated right ventricular systolic pressure is 29.6 mmHg.  3. Left atrial size was moderately dilated.  4. Right atrial size was moderately dilated.  5. The mitral valve is degenerative. Trivial mitral valve regurgitation.  Moderate mitral annular calcification.  6. The aortic valve is tricuspid. Aortic valve regurgitation is  trivial.  Possible paradoxical low flow/low gradient severe aortic valve stenosis,  however patient is in atrial fibrillation with RVR so would repeat echo  when rate is better-controlled.  Aortic valve area, by VTI measures 0.73 cm. Aortic valve mean gradient  measures 29.0 mmHg.  7. The inferior vena cava is normal in size with greater than 50%  respiratory variability, suggesting right atrial pressure of 3 mmHg.  8. Atrial fibrillation with RVR.   Patient Profile     75 y.o. female with permanent atrial fibrillation, aortic stenosis presenting with fatigue/generalized weakness,  Assessment & Plan    Permanent atrial fibrillation: -CHA2DS2/VAS Stroke Risk Points=4  -continue apixaban 5 mg BID -on rate control with diltiazem 180 mg BID and metoprolol tartrate 50 mg q12 hours. This has improved rate control and not worsened pauses -no utility to rhythm control given permanent atrial fib. Per Dr. Lovena Neighbours last note, trialed on 180 mg BID diltiazem. Had nausea/emesis on amiodarone. Do not restart as outpatient.  Low flow, low gradient AS: -had R/LHC as above -carotid dopplers, CT TAVR, CT angio, PT eval completed -appreciate structural heart team evaluation. Once seen by Dr. Cornelius Moras she can be discharged.  Hypercholesterolemia: per report, no recent lipids available. -coronary calcium, but no obstructive CAD by cath -continue rosuvastatin 20 mg daily  Hypertension: -BP stable on metoprolol and diltiazem  Type II diabetes: -last A1c 7.0 03/22/18 -blood sugars well controlled this admission  Chronic anemia: Hgb 11.3 today, recent range 11-12  Planned for discharge later today  For questions or updates, please contact [facility] Please consult [email or web address] for contact  info under     Signed, Jodelle Red, MD  06/24/2020, 11:05 AM

## 2020-06-25 ENCOUNTER — Other Ambulatory Visit: Payer: Self-pay

## 2020-06-25 DIAGNOSIS — I350 Nonrheumatic aortic (valve) stenosis: Secondary | ICD-10-CM

## 2020-06-25 NOTE — Progress Notes (Signed)
Surgical Instructions    Your procedure is scheduled on 06/30/20.  Report to [facility] Main Entrance "A" at 10:15 A.M., then check in with the Admitting office.  Call this number if you have problems the morning of surgery:  [phone]   If you have any questions prior to your surgery date call [phone]: Open Monday-Friday 8am-4pm    Remember:  Do not eat or drink after midnight the night before your surgery      Stop taking Eliquis on 5/25 (Wednesday).  Stop taking Metformin on 5/29 (Sunday). You will take your last dose on 5/28 (Saturday). Continue taking all other medications without change through the day before surgery.  On the morning of surgery do not take any medications.    HOW TO MANAGE YOUR DIABETES BEFORE AND AFTER SURGERY  Why is it important to control my blood sugar before and after surgery? . Improving blood sugar levels before and after surgery helps healing and can limit problems. . A way of improving blood sugar control is eating a healthy diet by: o  Eating less sugar and carbohydrates o  Increasing activity/exercise o  Talking with your doctor about reaching your blood sugar goals . High blood sugars (greater than 180 mg/dL) can raise your risk of infections and slow your recovery, so you will need to focus on controlling your diabetes during the weeks before surgery. . Make sure that the doctor who takes care of your diabetes knows about your planned surgery including the date and location.  How do I manage my blood sugar before surgery? . Check your blood sugar at least 4 times a day, starting 2 days before surgery, to make sure that the level is not too high or low.  . Check your blood sugar the morning of your surgery when you wake up and every 2 hours until you get to the Short Stay unit.  o If your blood sugar is less than 70 mg/dL, you will need to treat for low blood sugar: - Do not take insulin. - Treat a low blood sugar (less than 70  mg/dL) with  cup of clear juice (cranberry or apple), 4 glucose tablets, OR glucose gel. - Recheck blood sugar in 15 minutes after treatment (to make sure it is greater than 70 mg/dL). If your blood sugar is not greater than 70 mg/dL on recheck, call [phone] for further instructions. . Report your blood sugar to the short stay nurse when you get to Short Stay.  . If you are admitted to the hospital after surgery: o Your blood sugar will be checked by the staff and you will probably be given insulin after surgery (instead of oral diabetes medicines) to make sure you have good blood sugar levels. o The goal for blood sugar control after surgery is 80-180 mg/dL.                        Do not wear jewelry, make up, or nail polish            DO Not wear nail polish, gel polish, artificial nails, or any other type of covering on natural nails                 including finger and toenails.         -If you have artificial nails, gel coating, etc. that need to be removed by a nail salon  please have this removed prior to surgery or surgery may need to be             canceled/delayed if the surgeon/ anesthesia feels like you are unable to be adequately                monitored.              Do not wear lotions, powders, perfumes, or deodorant.            Do not shave 48 hours prior to surgery.             Do not bring valuables to the hospital.            [facility] is not responsible for any belongings or valuables.  Do NOT Smoke (Tobacco/Vaping) or drink Alcohol 24 hours prior to your procedure If you use a CPAP at night, you may bring all equipment for your overnight stay.   Contacts, glasses, dentures or bridgework may not be worn into surgery, please bring cases for these belongings   For patients admitted to the hospital, discharge time will be determined by your treatment team.   Patients discharged the day of surgery will not be allowed to drive home, and someone  needs to stay with them for 24 hours.    Special instructions:    Oral Hygiene is also important to reduce your risk of infection.  Remember - BRUSH YOUR TEETH THE MORNING OF SURGERY WITH YOUR REGULAR TOOTHPASTE   [place]- Preparing For Surgery  Before surgery, you can play an important role. Because skin is not sterile, your skin needs to be as free of germs as possible. You can reduce the number of germs on your skin by washing with CHG (chlorahexidine gluconate) Soap before surgery.  CHG is an antiseptic cleaner which kills germs and bonds with the skin to continue killing germs even after washing.     Please do not use if you have an allergy to CHG or antibacterial soaps. If your skin becomes reddened/irritated stop using the CHG.  Do not shave (including legs and underarms) for at least 48 hours prior to first CHG shower. It is OK to shave your face.  Please follow these instructions carefully.    1.  Shower the NIGHT BEFORE SURGERY and the MORNING OF SURGERY with CHG Soap.   If you chose to wash your hair, wash your hair first as usual with your normal shampoo. After you shampoo, rinse your hair and body thoroughly to remove the shampoo.  Then [employer] and genitals (private parts) with your normal soap and rinse thoroughly to remove soap.  2. After that Use CHG Soap as you would any other liquid soap. You can apply CHG directly to the skin and wash gently with a scrungie or a clean washcloth.   3. Apply the CHG Soap to your body ONLY FROM THE NECK DOWN.  Do not use on open wounds or open sores. Avoid contact with your eyes, ears, mouth and genitals (private parts). Wash Face and genitals (private parts)  with your normal soap.   4. Wash thoroughly, paying special attention to the area where your surgery will be performed.  5. Thoroughly rinse your body with warm water from the neck down.  6. DO NOT shower/wash with your normal soap after using and rinsing off the CHG  Soap.  7. Pat yourself dry with a CLEAN TOWEL.  8. Wear CLEAN PAJAMAS to bed  the night before surgery  9. Place CLEAN SHEETS on your bed the night before your surgery  10. DO NOT SLEEP WITH PETS.   Day of Surgery: Take a shower with CHG soap. Wear Clean/Comfortable clothing the morning of surgery Do not apply any deodorants/lotions.   Remember to brush your teeth WITH YOUR REGULAR TOOTHPASTE.   Please read over the following fact sheets that you were given.

## 2020-06-26 ENCOUNTER — Other Ambulatory Visit: Payer: Self-pay

## 2020-06-26 ENCOUNTER — Ambulatory Visit (HOSPITAL_COMMUNITY)
Admission: RE | Admit: 2020-06-26 | Discharge: 2020-06-26 | Disposition: A | Discharge status: Home / Self Care | Payer: Medicare Other | Source: Ambulatory Visit | Attending: Cardiovascular Disease | Admitting: Cardiovascular Disease

## 2020-06-26 ENCOUNTER — Encounter (HOSPITAL_COMMUNITY): Payer: Self-pay

## 2020-06-26 ENCOUNTER — Encounter (HOSPITAL_COMMUNITY)
Admit: 2020-06-26 | Discharge: 2020-06-26 | Disposition: A | Discharge status: Home / Self Care | Payer: Medicare Other | Attending: Cardiovascular Disease | Admitting: Cardiovascular Disease

## 2020-06-26 DIAGNOSIS — Z20822 Contact with and (suspected) exposure to covid-19: Secondary | ICD-10-CM | POA: Insufficient documentation

## 2020-06-26 DIAGNOSIS — Z01818 Encounter for other preprocedural examination: Secondary | ICD-10-CM | POA: Diagnosis not present

## 2020-06-26 DIAGNOSIS — I700 Atherosclerosis of aorta: Secondary | ICD-10-CM | POA: Diagnosis not present

## 2020-06-26 DIAGNOSIS — I350 Nonrheumatic aortic (valve) stenosis: Secondary | ICD-10-CM

## 2020-06-26 LAB — COMPREHENSIVE METABOLIC PANEL
ALT: 25 U/L (ref 0–44)
AST: 17 U/L (ref 15–41)
Albumin: 3.8 g/dL (ref 3.5–5.0)
Alkaline Phosphatase: 71 U/L (ref 38–126)
Anion gap: 9 (ref 5–15)
BUN: 15 mg/dL (ref 8–23)
CO2: 21 mmol/L — ABNORMAL LOW (ref 22–32)
Calcium: 9.6 mg/dL (ref 8.9–10.3)
Chloride: 108 mmol/L (ref 98–111)
Creatinine, Ser: 0.87 mg/dL (ref 0.44–1.00)
GFR, Estimated: >60 mL/min (ref >60)
Glucose, Bld: 141 mg/dL — ABNORMAL HIGH (ref 70–99)
Potassium: 4 mmol/L (ref 3.5–5.1)
Sodium: 138 mmol/L (ref 135–145)
Total Bilirubin: 1 mg/dL (ref 0.3–1.2)
Total Protein: 6.7 g/dL (ref 6.5–8.1)

## 2020-06-26 LAB — BLOOD GAS, ARTERIAL
Acid-base deficit: 1.7 mmol/L (ref 0.0–2.0)
Bicarbonate: 22 mmol/L (ref 20.0–28.0)
Drawn by: 602861
FIO2: 21
O2 Saturation: 95.9 %
Patient temperature: 37
pCO2 arterial: 34.3 mmHg (ref 32.0–48.0)
pH, Arterial: 7.424 (ref 7.350–7.450)
pO2, Arterial: 78.6 mmHg — ABNORMAL LOW (ref 83.0–108.0)

## 2020-06-26 LAB — TYPE AND SCREEN
ABO/RH(D): A POS
Antibody Screen: NEGATIVE

## 2020-06-26 LAB — CBC
HCT: 36.6 % (ref 36.0–46.0)
Hemoglobin: 12.2 g/dL (ref 12.0–15.0)
MCH: 31.5 pg (ref 26.0–34.0)
MCHC: 33.3 g/dL (ref 30.0–36.0)
MCV: 94.6 fL (ref 80.0–100.0)
Platelets: 238 10*3/uL (ref 150–400)
RBC: 3.87 MIL/uL (ref 3.87–5.11)
RDW: 14.1 % (ref 11.5–15.5)
WBC: 8.1 10*3/uL (ref 4.0–10.5)
nRBC: 0 % (ref 0.0–0.2)

## 2020-06-26 LAB — URINALYSIS, ROUTINE W REFLEX MICROSCOPIC
Glucose, UA: NEGATIVE mg/dL
Hgb urine dipstick: NEGATIVE
Ketones, ur: NEGATIVE mg/dL
Leukocytes,Ua: NEGATIVE
Nitrite: NEGATIVE
Protein, ur: NEGATIVE mg/dL
Specific Gravity, Urine: 1.02 (ref 1.005–1.030)
pH: 6.5 (ref 5.0–8.0)

## 2020-06-26 LAB — HEMOGLOBIN A1C
Hgb A1c MFr Bld: 7.7 % — ABNORMAL HIGH (ref 4.8–5.6)
Mean Plasma Glucose: 174 mg/dL

## 2020-06-26 LAB — PROTIME-INR
INR: 1.1 (ref 0.8–1.2)
Prothrombin Time: 14.2 seconds (ref 11.4–15.2)

## 2020-06-26 LAB — SURGICAL PCR SCREEN
MRSA, PCR: NEGATIVE
Staphylococcus aureus: NEGATIVE

## 2020-06-26 LAB — GLUCOSE, CAPILLARY: Glucose-Capillary: 153 mg/dL — ABNORMAL HIGH (ref 70–99)

## 2020-06-26 LAB — SARS CORONAVIRUS 2 (TAT 6-24 HRS): SARS Coronavirus 2: NEGATIVE

## 2020-06-26 NOTE — Progress Notes (Signed)
PCP - Dr. Donette Larry Cardiologist - Dr. Verdis Prime  PPM/ICD -  Device Orders -  Rep Notified -   Chest x-ray - 06/26/20 EKG - 06/21/20 Stress Test - patient states she has had one before but it was so long ago and she is not sure exactly when ECHO - 04/22/20 Cardiac Cath - 06/17/20  Sleep Study - patient denies CPAP -   Fasting Blood Sugar - patient does not check CBG at home Checks Blood Sugar _____ times a day  Blood Thinner Instructions: stop eliquis on 06/24/20 Aspirin Instructions:  ERAS Protcol - n/a PRE-SURGERY Ensure or G2-   COVID TEST-  At PAT appointment 06/26/20   Anesthesia review: yes, recent cath and hospitalization  Patient denies shortness of breath, fever, cough and chest pain at PAT appointment   All instructions explained to the patient, with a verbal understanding of the material. Patient agrees to go over the instructions while at home for a better understanding. Patient also instructed to self quarantine after being tested for COVID-19. The opportunity to ask questions was provided.

## 2020-06-29 MED ORDER — DEXMEDETOMIDINE HCL IN NACL 400 MCG/100ML IV SOLN
0.1000 ug/kg/h | INTRAVENOUS | Status: AC
  Administered 2020-06-30: .7 ug/kg/h via INTRAVENOUS
  Filled 2020-06-29 (×2): qty 100

## 2020-06-29 MED ORDER — MAGNESIUM SULFATE 50 % IJ SOLN
40.0000 meq | INTRAMUSCULAR | Status: DC
  Filled 2020-06-29: qty 9.85

## 2020-06-29 MED ORDER — POTASSIUM CHLORIDE 2 MEQ/ML IV SOLN
80.0000 meq | INTRAVENOUS | Status: DC
  Filled 2020-06-29: qty 40

## 2020-06-29 MED ORDER — SODIUM CHLORIDE 0.9 % IV SOLN
INTRAVENOUS | Status: DC
  Filled 2020-06-29: qty 30

## 2020-06-29 MED ORDER — CEFAZOLIN SODIUM-DEXTROSE 2-4 GM/100ML-% IV SOLN
2.0000 g | INTRAVENOUS | Status: AC
  Administered 2020-06-30: 2 g via INTRAVENOUS
  Filled 2020-06-29 (×2): qty 100

## 2020-06-29 MED ORDER — NOREPINEPHRINE 4 MG/250ML-% IV SOLN
0.0000 ug/min | INTRAVENOUS | Status: DC
  Filled 2020-06-29: qty 250

## 2020-06-30 ENCOUNTER — Ambulatory Visit (HOSPITAL_COMMUNITY): Payer: Medicare Other

## 2020-06-30 ENCOUNTER — Encounter: Payer: Self-pay | Admitting: Physician Assistant

## 2020-06-30 ENCOUNTER — Encounter (HOSPITAL_COMMUNITY): Payer: Self-pay | Admitting: Cardiovascular Disease

## 2020-06-30 ENCOUNTER — Inpatient Hospital Stay (HOSPITAL_COMMUNITY)
Admission: RE | Admit: 2020-06-30 | Discharge: 2020-07-01 | DRG: 267 | Disposition: A | Discharge status: Home / Self Care | Payer: Medicare Other | Attending: Cardiovascular Disease | Admitting: Cardiovascular Disease

## 2020-06-30 ENCOUNTER — Inpatient Hospital Stay (HOSPITAL_COMMUNITY): Payer: Medicare Other | Admitting: Physician Assistant

## 2020-06-30 ENCOUNTER — Other Ambulatory Visit: Payer: Self-pay

## 2020-06-30 ENCOUNTER — Other Ambulatory Visit: Payer: Self-pay | Admitting: Physician Assistant

## 2020-06-30 ENCOUNTER — Encounter (HOSPITAL_COMMUNITY)
Admission: RE | Disposition: A | Discharge status: Home / Self Care | Payer: Medicare Other | Source: Home / Self Care | Attending: Cardiovascular Disease

## 2020-06-30 DIAGNOSIS — F32A Depression, unspecified: Secondary | ICD-10-CM | POA: Diagnosis not present

## 2020-06-30 DIAGNOSIS — E1165 Type 2 diabetes mellitus with hyperglycemia: Secondary | ICD-10-CM

## 2020-06-30 DIAGNOSIS — G2581 Restless legs syndrome: Secondary | ICD-10-CM | POA: Diagnosis present

## 2020-06-30 DIAGNOSIS — Z79899 Other long term (current) drug therapy: Secondary | ICD-10-CM | POA: Diagnosis not present

## 2020-06-30 DIAGNOSIS — I10 Essential (primary) hypertension: Secondary | ICD-10-CM | POA: Diagnosis present

## 2020-06-30 DIAGNOSIS — E119 Type 2 diabetes mellitus without complications: Secondary | ICD-10-CM | POA: Diagnosis not present

## 2020-06-30 DIAGNOSIS — I4821 Permanent atrial fibrillation: Secondary | ICD-10-CM | POA: Diagnosis not present

## 2020-06-30 DIAGNOSIS — I358 Other nonrheumatic aortic valve disorders: Secondary | ICD-10-CM | POA: Diagnosis not present

## 2020-06-30 DIAGNOSIS — I6529 Occlusion and stenosis of unspecified carotid artery: Secondary | ICD-10-CM | POA: Diagnosis not present

## 2020-06-30 DIAGNOSIS — I495 Sick sinus syndrome: Secondary | ICD-10-CM | POA: Diagnosis not present

## 2020-06-30 DIAGNOSIS — Z8249 Family history of ischemic heart disease and other diseases of the circulatory system: Secondary | ICD-10-CM

## 2020-06-30 DIAGNOSIS — Z006 Encounter for examination for normal comparison and control in clinical research program: Secondary | ICD-10-CM

## 2020-06-30 DIAGNOSIS — E669 Obesity, unspecified: Secondary | ICD-10-CM | POA: Diagnosis not present

## 2020-06-30 DIAGNOSIS — Z888 Allergy status to other drugs, medicaments and biological substances status: Secondary | ICD-10-CM | POA: Diagnosis not present

## 2020-06-30 DIAGNOSIS — Z6832 Body mass index (BMI) 32.0-32.9, adult: Secondary | ICD-10-CM

## 2020-06-30 DIAGNOSIS — Z7984 Long term (current) use of oral hypoglycemic drugs: Secondary | ICD-10-CM

## 2020-06-30 DIAGNOSIS — Z7901 Long term (current) use of anticoagulants: Secondary | ICD-10-CM

## 2020-06-30 DIAGNOSIS — E559 Vitamin D deficiency, unspecified: Secondary | ICD-10-CM | POA: Diagnosis not present

## 2020-06-30 DIAGNOSIS — I350 Nonrheumatic aortic (valve) stenosis: Principal | ICD-10-CM

## 2020-06-30 DIAGNOSIS — K219 Gastro-esophageal reflux disease without esophagitis: Secondary | ICD-10-CM | POA: Diagnosis present

## 2020-06-30 DIAGNOSIS — Z952 Presence of prosthetic heart valve: Secondary | ICD-10-CM | POA: Insufficient documentation

## 2020-06-30 DIAGNOSIS — M069 Rheumatoid arthritis, unspecified: Secondary | ICD-10-CM | POA: Diagnosis not present

## 2020-06-30 DIAGNOSIS — E785 Hyperlipidemia, unspecified: Secondary | ICD-10-CM | POA: Diagnosis present

## 2020-06-30 DIAGNOSIS — I2510 Atherosclerotic heart disease of native coronary artery without angina pectoris: Secondary | ICD-10-CM | POA: Diagnosis present

## 2020-06-30 DIAGNOSIS — Z91030 Bee allergy status: Secondary | ICD-10-CM

## 2020-06-30 DIAGNOSIS — I779 Disorder of arteries and arterioles, unspecified: Secondary | ICD-10-CM | POA: Diagnosis present

## 2020-06-30 DIAGNOSIS — Z823 Family history of stroke: Secondary | ICD-10-CM

## 2020-06-30 DIAGNOSIS — D649 Anemia, unspecified: Secondary | ICD-10-CM | POA: Diagnosis not present

## 2020-06-30 DIAGNOSIS — Q2730 Arteriovenous malformation, site unspecified: Secondary | ICD-10-CM

## 2020-06-30 DIAGNOSIS — R590 Localized enlarged lymph nodes: Secondary | ICD-10-CM | POA: Diagnosis not present

## 2020-06-30 DIAGNOSIS — I4819 Other persistent atrial fibrillation: Secondary | ICD-10-CM | POA: Diagnosis not present

## 2020-06-30 DIAGNOSIS — E66811 Obesity, class 1: Secondary | ICD-10-CM

## 2020-06-30 DIAGNOSIS — Z8042 Family history of malignant neoplasm of prostate: Secondary | ICD-10-CM

## 2020-06-30 DIAGNOSIS — Z91018 Allergy to other foods: Secondary | ICD-10-CM

## 2020-06-30 HISTORY — PX: TRANSCATHETER AORTIC VALVE REPLACEMENT, TRANSFEMORAL: SHX6400

## 2020-06-30 HISTORY — DX: Other persistent atrial fibrillation: I48.19

## 2020-06-30 HISTORY — DX: Nonrheumatic aortic (valve) stenosis: I35.0

## 2020-06-30 HISTORY — DX: Disorder of arteries and arterioles, unspecified: I77.9

## 2020-06-30 HISTORY — DX: Presence of prosthetic heart valve: Z95.2

## 2020-06-30 LAB — POCT I-STAT, CHEM 8
BUN: 17 mg/dL (ref 8–23)
BUN: 15 mg/dL (ref 8–23)
Calcium, Ion: 1.31 mmol/L (ref 1.15–1.40)
Calcium, Ion: 1.33 mmol/L (ref 1.15–1.40)
Chloride: 108 mmol/L (ref 98–111)
Chloride: 108 mmol/L (ref 98–111)
Creatinine, Ser: 0.7 mg/dL (ref 0.44–1.00)
Creatinine, Ser: 0.7 mg/dL (ref 0.44–1.00)
Glucose, Bld: 121 mg/dL — ABNORMAL HIGH (ref 70–99)
Glucose, Bld: 150 mg/dL — ABNORMAL HIGH (ref 70–99)
HCT: 39 % (ref 36.0–46.0)
HCT: 34 % — ABNORMAL LOW (ref 36.0–46.0)
Hemoglobin: 13.3 g/dL (ref 12.0–15.0)
Hemoglobin: 11.6 g/dL — ABNORMAL LOW (ref 12.0–15.0)
Potassium: 4.2 mmol/L (ref 3.5–5.1)
Potassium: 4.1 mmol/L (ref 3.5–5.1)
Sodium: 141 mmol/L (ref 135–145)
Sodium: 141 mmol/L (ref 135–145)
TCO2: 21 mmol/L — ABNORMAL LOW (ref 22–32)
TCO2: 22 mmol/L (ref 22–32)

## 2020-06-30 LAB — ECHOCARDIOGRAM LIMITED
AR max vel: 1.49 cm2
AV Area VTI: 1.22 cm2
AV Area mean vel: 1.29 cm2
AV Mean grad: 11.5 mmHg
AV Peak grad: 14.6 mmHg
Ao pk vel: 1.91 m/s

## 2020-06-30 LAB — POCT ACTIVATED CLOTTING TIME
Activated Clotting Time: 138 seconds
Activated Clotting Time: 303 seconds
Activated Clotting Time: 320 seconds
Activated Clotting Time: 119 seconds

## 2020-06-30 LAB — GLUCOSE, CAPILLARY
Glucose-Capillary: 171 mg/dL — ABNORMAL HIGH (ref 70–99)
Glucose-Capillary: 273 mg/dL — ABNORMAL HIGH (ref 70–99)

## 2020-06-30 LAB — ABO/RH: ABO/RH(D): A POS

## 2020-06-30 SURGERY — IMPLANTATION, AORTIC VALVE, TRANSCATHETER, FEMORAL APPROACH
Anesthesia: Monitor Anesthesia Care | Site: Chest

## 2020-06-30 MED ORDER — ROSUVASTATIN CALCIUM 20 MG PO TABS
20.0000 mg | ORAL_TABLET | Freq: Every day | ORAL | Status: DC
  Administered 2020-06-30: 20 mg via ORAL
  Filled 2020-06-30: qty 1

## 2020-06-30 MED ORDER — MORPHINE SULFATE (PF) 2 MG/ML IV SOLN: 1.0000 mg | INTRAVENOUS | Status: DC | PRN

## 2020-06-30 MED ORDER — CEFAZOLIN SODIUM-DEXTROSE 2-4 GM/100ML-% IV SOLN
2.0000 g | Freq: Three times a day (TID) | INTRAVENOUS | Status: AC
  Administered 2020-06-30 – 2020-07-01 (×2): 2 g via INTRAVENOUS
  Filled 2020-06-30 (×2): qty 100

## 2020-06-30 MED ORDER — OXYCODONE HCL 5 MG PO TABS: 5.0000 mg | ORAL_TABLET | ORAL | Status: DC | PRN

## 2020-06-30 MED ORDER — METOPROLOL TARTRATE 50 MG PO TABS
50.0000 mg | ORAL_TABLET | Freq: Two times a day (BID) | ORAL | Status: DC
  Administered 2020-06-30 – 2020-07-01 (×2): 50 mg via ORAL
  Filled 2020-06-30 (×2): qty 1

## 2020-06-30 MED ORDER — IOHEXOL 350 MG/ML SOLN
INTRAVENOUS | Status: DC | PRN
Start: 2020-06-30 — End: 2020-06-30
  Administered 2020-06-30: 60 mL via INTRA_ARTERIAL

## 2020-06-30 MED ORDER — LACTATED RINGERS IV SOLN: INTRAVENOUS | Status: DC

## 2020-06-30 MED ORDER — ONDANSETRON HCL 4 MG/2ML IJ SOLN: 4.0000 mg | Freq: Four times a day (QID) | INTRAMUSCULAR | Status: DC | PRN

## 2020-06-30 MED ORDER — ASPIRIN 81 MG PO CHEW
81.0000 mg | CHEWABLE_TABLET | Freq: Every day | ORAL | Status: DC
  Administered 2020-07-01: 81 mg via ORAL
  Filled 2020-06-30: qty 1

## 2020-06-30 MED ORDER — TRAMADOL HCL 50 MG PO TABS
50.0000 mg | ORAL_TABLET | ORAL | Status: DC | PRN
Start: 2020-06-30 — End: 2020-07-01

## 2020-06-30 MED ORDER — CHLORHEXIDINE GLUCONATE 4 % EX LIQD
30.0000 mL | CUTANEOUS | Status: DC
  Filled 2020-06-30: qty 30

## 2020-06-30 MED ORDER — HEPARIN (PORCINE) IN NACL 1000-0.9 UT/500ML-% IV SOLN
INTRAVENOUS | Status: DC | PRN
  Administered 2020-06-30 (×3): 500 mL

## 2020-06-30 MED ORDER — ACETAMINOPHEN 325 MG PO TABS: 650.0000 mg | ORAL_TABLET | Freq: Four times a day (QID) | ORAL | Status: DC | PRN

## 2020-06-30 MED ORDER — SODIUM CHLORIDE 0.9 % IV SOLN
INTRAVENOUS | Status: DC
Start: 2020-07-01 — End: 2020-06-30

## 2020-06-30 MED ORDER — LIDOCAINE HCL (PF) 1 % IJ SOLN
INTRAMUSCULAR | Status: AC
  Filled 2020-06-30: qty 30

## 2020-06-30 MED ORDER — PHENYLEPHRINE HCL-NACL 10-0.9 MG/250ML-% IV SOLN
0.0000 ug/min | INTRAVENOUS | Status: DC
  Filled 2020-06-30: qty 250

## 2020-06-30 MED ORDER — SODIUM CHLORIDE 0.9 % IV SOLN: 250.0000 mL | INTRAVENOUS | Status: DC | PRN

## 2020-06-30 MED ORDER — METOPROLOL TARTRATE 50 MG PO TABS
50.0000 mg | ORAL_TABLET | ORAL | Status: AC
  Administered 2020-06-30: 50 mg via ORAL

## 2020-06-30 MED ORDER — CHLORHEXIDINE GLUCONATE 0.12 % MT SOLN
15.0000 mL | Freq: Once | OROMUCOSAL | Status: AC
Start: 2020-07-01 — End: 2020-06-30

## 2020-06-30 MED ORDER — SODIUM CHLORIDE 0.9% FLUSH
3.0000 mL | Freq: Two times a day (BID) | INTRAVENOUS | Status: DC
  Administered 2020-07-01 (×2): 3 mL via INTRAVENOUS

## 2020-06-30 MED ORDER — NITROGLYCERIN IN D5W 200-5 MCG/ML-% IV SOLN: 0.0000 ug/min | INTRAVENOUS | Status: DC

## 2020-06-30 MED ORDER — METOPROLOL TARTRATE 50 MG PO TABS
ORAL_TABLET | ORAL | Status: AC
  Filled 2020-06-30: qty 1

## 2020-06-30 MED ORDER — ACETAMINOPHEN 650 MG RE SUPP: 650.0000 mg | Freq: Four times a day (QID) | RECTAL | Status: DC | PRN

## 2020-06-30 MED ORDER — LORATADINE 10 MG PO TABS: 10.0000 mg | ORAL_TABLET | Freq: Every day | ORAL | Status: DC

## 2020-06-30 MED ORDER — PROTAMINE SULFATE 10 MG/ML IV SOLN
INTRAVENOUS | Status: DC | PRN
  Administered 2020-06-30: 100 mg via INTRAVENOUS

## 2020-06-30 MED ORDER — CHLORHEXIDINE GLUCONATE 0.12 % MT SOLN: 15.0000 mL | Freq: Once | OROMUCOSAL | Status: DC

## 2020-06-30 MED ORDER — PANTOPRAZOLE SODIUM 40 MG PO TBEC: 40.0000 mg | DELAYED_RELEASE_TABLET | Freq: Every day | ORAL | Status: DC | PRN

## 2020-06-30 MED ORDER — ORAL CARE MOUTH RINSE: 15.0000 mL | Freq: Once | OROMUCOSAL | Status: DC

## 2020-06-30 MED ORDER — ONDANSETRON HCL 4 MG/2ML IJ SOLN
INTRAMUSCULAR | Status: DC | PRN
  Administered 2020-06-30: 4 mg via INTRAVENOUS

## 2020-06-30 MED ORDER — INSULIN ASPART 100 UNIT/ML IJ SOLN
0.0000 [IU] | Freq: Three times a day (TID) | INTRAMUSCULAR | Status: DC
  Administered 2020-06-30: 12 [IU] via SUBCUTANEOUS
  Administered 2020-07-01: 4 [IU] via SUBCUTANEOUS

## 2020-06-30 MED ORDER — CHLORHEXIDINE GLUCONATE 4 % EX LIQD
60.0000 mL | Freq: Once | CUTANEOUS | Status: DC
Start: 2020-07-01 — End: 2020-06-30

## 2020-06-30 MED ORDER — SODIUM CHLORIDE 0.9 % IV SOLN: INTRAVENOUS | Status: AC

## 2020-06-30 MED ORDER — PROPOFOL 10 MG/ML IV BOLUS
INTRAVENOUS | Status: DC | PRN
  Administered 2020-06-30 (×3): 30 mg via INTRAVENOUS

## 2020-06-30 MED ORDER — LIDOCAINE HCL (PF) 1 % IJ SOLN
INTRAMUSCULAR | Status: DC | PRN
  Administered 2020-06-30 (×2): 10 mL

## 2020-06-30 MED ORDER — CHLORHEXIDINE GLUCONATE 4 % EX LIQD: 60.0000 mL | Freq: Once | CUTANEOUS | Status: DC

## 2020-06-30 MED ORDER — HEPARIN (PORCINE) IN NACL 1000-0.9 UT/500ML-% IV SOLN
INTRAVENOUS | Status: AC
  Filled 2020-06-30: qty 1500

## 2020-06-30 MED ORDER — SODIUM CHLORIDE 0.9% FLUSH: 3.0000 mL | INTRAVENOUS | Status: DC | PRN

## 2020-06-30 MED ORDER — HEPARIN SODIUM (PORCINE) 1000 UNIT/ML IJ SOLN
INTRAMUSCULAR | Status: DC | PRN
  Administered 2020-06-30: 13000 [IU] via INTRAVENOUS

## 2020-06-30 MED ORDER — CHLORHEXIDINE GLUCONATE 0.12 % MT SOLN
OROMUCOSAL | Status: AC
  Administered 2020-06-30: 15 mL via OROMUCOSAL
  Filled 2020-06-30: qty 15

## 2020-06-30 MED ORDER — ROPINIROLE HCL 1 MG PO TABS
4.0000 mg | ORAL_TABLET | Freq: Every day | ORAL | Status: DC
  Administered 2020-06-30: 4 mg via ORAL
  Filled 2020-06-30 (×2): qty 4

## 2020-06-30 MED ORDER — LIDOCAINE 2% (20 MG/ML) 5 ML SYRINGE
INTRAMUSCULAR | Status: DC | PRN
  Administered 2020-06-30: 30 mg via INTRAVENOUS

## 2020-06-30 MED ORDER — DEXAMETHASONE SODIUM PHOSPHATE 10 MG/ML IJ SOLN
INTRAMUSCULAR | Status: DC | PRN
  Administered 2020-06-30: 5 mg via INTRAVENOUS

## 2020-06-30 SURGICAL SUPPLY — 30 items
BAG SNAP BAND KOVER 36X36 (MISCELLANEOUS) ×6 IMPLANT
BLANKET WARM UNDERBOD FULL ACC (MISCELLANEOUS) ×3 IMPLANT
CABLE ADAPT PACING TEMP 12FT (ADAPTER) ×3 IMPLANT
CATH 23 ULTRA DELIVERY (CATHETERS) ×3 IMPLANT
CATH DIAG 6FR PIGTAIL ANGLED (CATHETERS) ×6 IMPLANT
CATH INFINITI 6F AL1 (CATHETERS) ×3 IMPLANT
CATH S G BIP PACING (CATHETERS) ×3 IMPLANT
CLOSURE MYNX CONTROL 6F/7F (Vascular Products) ×3 IMPLANT
CLOSURE PERCLOSE PROSTYLE (VASCULAR PRODUCTS) ×6 IMPLANT
CRIMPER (MISCELLANEOUS) ×3 IMPLANT
DEVICE INFLATION ATRION QL2530 (MISCELLANEOUS) ×3 IMPLANT
ELECT DEFIB PAD ADLT CADENCE (PAD) ×3 IMPLANT
GUIDEWIRE SAFE TJ AMPLATZ EXST (WIRE) ×3 IMPLANT
KIT HEART LEFT (KITS) ×3 IMPLANT
KIT MICROPUNCTURE NIT STIFF (SHEATH) ×3 IMPLANT
PACK CARDIAC CATHETERIZATION (CUSTOM PROCEDURE TRAY) ×3 IMPLANT
SHEATH BRITE TIP 7FR 35CM (SHEATH) ×3 IMPLANT
SHEATH EDWARDS INTRO SET 14X36 (SHEATH) ×3 IMPLANT
SHEATH PINNACLE 6F 10CM (SHEATH) ×3 IMPLANT
SHEATH PINNACLE 8F 10CM (SHEATH) ×3 IMPLANT
SHEATH PROBE COVER 6X72 (BAG) ×3 IMPLANT
SLEEVE REPOSITIONING LENGTH 30 (MISCELLANEOUS) ×3 IMPLANT
STOPCOCK MORSE 400PSI 3WAY (MISCELLANEOUS) ×6 IMPLANT
SYR MEDRAD MARK V 150ML (SYRINGE) ×3 IMPLANT
TRANSDUCER W/STOPCOCK (MISCELLANEOUS) ×6 IMPLANT
VALVE 23 ULTRA SAPIEN KIT (Valve) ×3 IMPLANT
WIRE AMPLATZ SS-J .035X180CM (WIRE) ×3 IMPLANT
WIRE EMERALD 3MM-J .035X150CM (WIRE) ×3 IMPLANT
WIRE EMERALD 3MM-J .035X260CM (WIRE) ×3 IMPLANT
WIRE EMERALD ST .035X260CM (WIRE) ×3 IMPLANT

## 2020-06-30 NOTE — Anesthesia Preprocedure Evaluation (Addendum)
Anesthesia Evaluation  Patient identified by MRN, date of birth, ID band Patient awake    Reviewed: Allergy & Precautions, NPO status , reviewed documented beta blocker date and time , Unable to perform ROS - Chart review only  History of Anesthesia Complications Negative for: history of anesthetic complications  Airway Mallampati: I  TM Distance: >3 FB Neck ROM: Full    Dental  (+) Upper Dentures, Lower Dentures, Dental Advisory Given   Pulmonary shortness of breath and with exertion, asthma ,    Pulmonary exam normal        Cardiovascular hypertension, Pt. on medications +CHF  + dysrhythmias Atrial Fibrillation + Valvular Problems/Murmurs AS  Rhythm:Regular Rate:Normal + Systolic murmurs IMPRESSIONS    1. Left ventricular ejection fraction, by estimation, is 55%. The left  ventricle has no regional wall motion abnormalities. There is mild left  ventricular hypertrophy. Left ventricular diastolic parameters are  indeterminate.  2. Right ventricular systolic function is mildly reduced. The right  ventricular size is mildly enlarged. Peak RV-RA gradient 26 mmHg.  3. The mitral valve is abnormal. Mild mitral valve regurgitation.  Moderate mitral annular and valvular calcification. Visually, there  appears to be mitral stenosis but gradient was not measured. Would  consider additional images to assess degree of  mitral stenosis.  4. Tricuspid valve regurgitation is moderate to severe.  5. The aortic valve is tricuspid. Aortic valve regurgitation is not  visualized. Severe aortic valve stenosis. Aortic valve area, by VTI  measures 0.70 cm. Aortic valve mean gradient measures 38.0 mmHg.  6. The IVC was not visualized.    Neuro/Psych PSYCHIATRIC DISORDERS Depression Restless legs syndrome    GI/Hepatic Neg liver ROS, GERD  Medicated and Controlled,  Endo/Other  diabetes, Well Controlled, Type 2, Oral Hypoglycemic  AgentsObesity  Hyperlipidemia  Renal/GU negative Renal ROS  negative genitourinary   Musculoskeletal  (+) Arthritis , Osteoarthritis,    Abdominal (+) + obese,   Peds  Hematology  (+) anemia , Eliquis therapy- last dose yesterday am; patient to get another dose this am   Anesthesia Other Findings   Reproductive/Obstetrics                            Anesthesia Physical  Anesthesia Plan  ASA: IV  Anesthesia Plan: MAC   Post-op Pain Management:    Induction:   PONV Risk Score and Plan: 2 and Treatment may vary due to age or medical condition, Ondansetron and Propofol infusion  Airway Management Planned: Natural Airway, Simple Face Mask and Nasal Cannula  Additional Equipment:   Intra-op Plan:   Post-operative Plan:   Informed Consent: I have reviewed the patients History and Physical, chart, labs and discussed the procedure including the risks, benefits and alternatives for the proposed anesthesia with the patient or authorized representative who has indicated his/her understanding and acceptance.     Dental advisory given  Plan Discussed with: Anesthesiologist and Surgeon  Anesthesia Plan Comments:        Anesthesia Quick Evaluation

## 2020-06-30 NOTE — CV Procedure (Signed)
[facility]  TAVR OPERATIVE NOTE   Date of Procedure:  06/30/2020  Preoperative Diagnosis: Severe Aortic Stenosis   Postoperative Diagnosis: Same   Procedure:    Transcatheter Aortic Valve Replacement - Transfemoral Approach  Edwards Sapien 3 THV (size 23 mm, model # [id number], serial # [id number])   Co-Surgeons:  Lauree Chandler, MD and Valentina Gu. Roxy Manns, MD   Anesthesiologist:  Tobias Alexander  Echocardiographer:  O'Neal  Pre-operative Echo Findings:  Severe aortic stenosis  Normal left ventricular systolic function  Post-operative Echo Findings:  No paravalvular leak  Normal left ventricular systolic function  BRIEF CLINICAL NOTE AND INDICATIONS FOR SURGERY   75 yo female with history of anemia, hyperlipidemia, rheumatoid arthritis, GERD, HTN, diabetes, persistent atrial fibrillation and severe aortic stenosis. She is followed in our office by Dr. Tamala Julian. Echo April 2022 with LVEF=55-60%, mild LVH. Moderate mitral annular calcification with trivial mitral regurgitation. The aortic valve leaflets are thickened and calcified with limited leaflet excursion. Mean gradient 29 mmHg, peak gradient 31 mmHg, AVA 0.73 cm2. Dimensionless index 0.23. SVI 19. This is consistent with paradoxical low flow/low gradient severe aortic stenosis. She was in rapid atrial fibrillation during the echo. She has persistent atrial fibrillation and is on Eliquis. She has borderline hyperglycemia and is on metformin. She was seen by Dr. Tamala Julian 06/09/20 and c/o exertional dyspnea and fatigue that has progressively worsened over the past six months. Cardiac cath with no obstructive CAD. Admitted with fatigue and weakness on 06/21/20. Work up in the ER was unremarkable except for continued afib with RVR and she was started back on amiodarone. CT angio to rule out dissection showed no acute abnormality or PE. There was nonspecific right hilar lymphadenopathy and prominent left hilar/mediastinal lymph  nodes. Findings may be reactive in etiology. 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 EP was consulted for discussion of AV node ablation + PPM placement. They recommended continued medical therapy with amiodarone since HRs had improved to 60-100 bpm. However, her HRs are now much better controlled.   During the course of the patient's preoperative work up they have been evaluated comprehensively by a multidisciplinary team of specialists coordinated through the [facility] in the [facility].  They have been demonstrated to suffer from symptomatic severe aortic stenosis as noted above. The patient has been counseled extensively as to the relative risks and benefits of all options for the treatment of severe aortic stenosis including long term medical therapy, conventional surgery for aortic valve replacement, and transcatheter aortic valve replacement.  The patient has been independently evaluated by Dr. Roxy Manns with CT surgery and they are felt to be at high risk for conventional surgical aortic valve replacement. The surgeon indicated the patient would be a poor candidate for conventional surgery. Based upon review of all of the patient's preoperative diagnostic tests they are felt to be candidate for transcatheter aortic valve replacement using the transfemoral approach as an alternative to high risk conventional surgery.    Following the decision to proceed with transcatheter aortic valve replacement, a discussion has been held regarding what types of management strategies would be attempted intraoperatively in the event of life-threatening complications, including whether or not the patient would be considered a candidate for the use of cardiopulmonary bypass and/or conversion to open sternotomy for attempted surgical intervention.  The patient has  been advised of a variety of  complications that might develop peculiar to this approach including but not limited to risks of death, stroke, paravalvular leak, aortic dissection or other major vascular complications, aortic annulus rupture, device embolization, cardiac rupture or perforation, acute myocardial infarction, arrhythmia, heart block or bradycardia requiring permanent pacemaker placement, congestive heart failure, respiratory failure, renal failure, pneumonia, infection, other late complications related to structural valve deterioration or migration, or other complications that might ultimately cause a temporary or permanent loss of functional independence or other long term morbidity.  The patient provides full informed consent for the procedure as described and all questions were answered preoperatively.    DETAILS OF THE OPERATIVE PROCEDURE  PREPARATION:   The patient is brought to the operating room on the above mentioned date and central monitoring was established by the anesthesia team including placement of a radial arterial line. The patient is placed in the supine position on the operating table.  Intravenous antibiotics are administered. Conscious sedation is used.   Baseline transthoracic echocardiogram was performed. The patient's chest, abdomen, both groins, and both lower extremities are prepared and draped in a sterile manner. A time out procedure is performed.   PERIPHERAL ACCESS:   Using the modified Seldinger technique, femoral arterial and venous access were obtained with placement of a 6 Fr sheath in the artery and a 7 Fr sheath in the vein on the left side using u/s guidance.  A pigtail diagnostic catheter was passed through the femoral arterial sheath under fluoroscopic guidance into the aortic root.  A temporary transvenous pacemaker catheter was passed through the femoral venous sheath under fluoroscopic guidance into the right ventricle.  The pacemaker was tested to  ensure stable lead placement and pacemaker capture. Aortic root angiography was performed in order to determine the optimal angiographic angle for valve deployment.  TRANSFEMORAL ACCESS:  A micropuncture kit was used to gain access to the left femoral artery using u/s guidance. Position confirmed with angiography. Pre-closure with double ProGlide closure devices. The patient was heparinized systemically and ACT verified > 250 seconds.    A 14 Fr transfemoral E-sheath was introduced into the right femoral artery after progressively dilating over an Amplatz superstiff wire. An AL-1 catheter was used to direct a straight-tip exchange length wire across the native aortic valve into the left ventricle. This was exchanged out for a pigtail catheter and position was confirmed in the LV apex. Simultaneous LV and Ao pressures were recorded.  The pigtail catheter was then exchanged for an Amplatz Extra-stiff wire in the LV apex.   TRANSCATHETER HEART VALVE DEPLOYMENT:  An Edwards Sapien 3 THV (size 23 mm) was prepared and crimped per manufacturer's guidelines, and the proper orientation of the valve is confirmed on the [employer] delivery system. The valve was advanced through the introducer sheath using normal technique until in an appropriate position in the abdominal aorta beyond the sheath tip. The balloon was then retracted and using the fine-tuning wheel was centered on the valve. The valve was then advanced across the aortic arch using appropriate flexion of the catheter. The valve was carefully positioned across the aortic valve annulus. The Commander catheter was retracted using normal technique. Once final position of the valve has been confirmed by angiographic assessment, the valve is deployed while temporarily holding ventilation and during rapid ventricular pacing to maintain systolic blood pressure < 50 mmHg and pulse pressure < 10 mmHg. The balloon inflation is held for >3 seconds after reaching  full deployment volume. Once the balloon has fully  deflated the balloon is retracted into the ascending aorta and valve function is assessed using TTE. There is felt to be no paravalvular leak and no central aortic insufficiency.  The patient's hemodynamic recovery following valve deployment is good.  The deployment balloon and guidewire are both removed. Echo demostrated acceptable post-procedural gradients, stable mitral valve function, and no AI.   PROCEDURE COMPLETION:  The sheath was then removed and closure devices were completed. Protamine was administered once femoral arterial repair was complete. The temporary pacemaker, pigtail catheters and femoral sheaths were removed with a Mynx closure device placed in the artery and manual pressure used for venous hemostasis.    The patient tolerated the procedure well and is transported to the surgical intensive care in stable condition. There were no immediate intraoperative complications. All sponge instrument and needle counts are verified correct at completion of the operation.   No blood products were administered during the operation.  The patient received a total of 60 mL of intravenous contrast during the procedure.  Lauree Chandler MD 06/30/2020 3:52 PM

## 2020-06-30 NOTE — Progress Notes (Signed)
  [facility]   MULTIDISCIPLINARY HEART VALVE TEAM  Patient doing well s/p TAVR.She is hemodynamically stable. Groin sites stable. ECG with afib with CVR and no high grade block. Plan to dc art line and send to 4E. Plan for early ambulation after bedrest completed and hopeful discharge over the next 24-48 hours.   Cline Crock PA-C  [facility]  Pager [phone]

## 2020-06-30 NOTE — Anesthesia Procedure Notes (Signed)
Procedure Name: MAC Date/Time: 06/30/2020 2:35 PM Performed by: Reece Agar, CRNA Pre-anesthesia Checklist: Patient identified, Emergency Drugs available, Suction available and Patient being monitored Patient Re-evaluated:Patient Re-evaluated prior to induction Oxygen Delivery Method: Simple face mask

## 2020-06-30 NOTE — Progress Notes (Signed)
20 G L radial arterial line was pulled, and manual pressure was held for 10 min. L wrist was level zero, with radial pule 2+.

## 2020-06-30 NOTE — Interval H&P Note (Signed)
History and Physical Interval Note:  06/30/2020 10:21 AM  Briana Foster  has presented today for surgery, with the diagnosis of Severe Aortic Stenosis.  The various methods of treatment have been discussed with the patient and family. After consideration of risks, benefits and other options for treatment, the patient has consented to  Procedure(s): TRANSCATHETER AORTIC VALVE REPLACEMENT, TRANSFEMORAL (N/A) TRANSESOPHAGEAL ECHOCARDIOGRAM (TEE) (N/A) as a surgical intervention.  The patient's history has been reviewed, patient examined, no change in status, stable for surgery.  I have reviewed the patient's chart and labs.  Questions were answered to the patient's satisfaction.     [name]

## 2020-06-30 NOTE — Anesthesia Postprocedure Evaluation (Signed)
Anesthesia Post Note  Patient: Briana Foster  Procedure(s) Performed: TRANSCATHETER AORTIC VALVE REPLACEMENT, TRANSFEMORAL (N/A Chest)     Patient location during evaluation: PACU Anesthesia Type: MAC Level of consciousness: awake and alert Pain management: pain level controlled Vital Signs Assessment: post-procedure vital signs reviewed and stable Respiratory status: spontaneous breathing and respiratory function stable Cardiovascular status: stable Postop Assessment: no apparent nausea or vomiting Anesthetic complications: no   Encounter Complications  Complication Outcome Phase Comment  None  Intraprocedure     Last Vitals:  Vitals:   06/30/20 1611 06/30/20 1701  BP: (!) 106/58   Pulse: 95 86  Resp: 18 17  Temp: (!) 36.2 C (!) 36.2 C  SpO2:      Last Pain:  Vitals:   06/30/20 1701  TempSrc: Temporal  PainSc: 0-No pain                 Herberth Deharo DANIEL

## 2020-06-30 NOTE — Progress Notes (Signed)
Pt transferred from PACU post TAVR, tele initiated, CHG completed.  BL groin clean and intact, no hematoma.  VSS.  Will continue to monitor

## 2020-06-30 NOTE — Transfer of Care (Signed)
Immediate Anesthesia Transfer of Care Note  Patient: Christeen Douglas  Procedure(s) Performed: TRANSCATHETER AORTIC VALVE REPLACEMENT, TRANSFEMORAL (N/A Chest)  Patient Location: PACU and Cath Lab  Anesthesia Type:MAC  Level of Consciousness: awake and alert   Airway & Oxygen Therapy: Patient Spontanous Breathing and Patient connected to face mask oxygen  Post-op Assessment: Report given to RN and Post -op Vital signs reviewed and stable  Post vital signs: Reviewed and stable  Last Vitals:  Vitals Value Taken Time  BP 106/58 06/30/20 1610  Temp    Pulse 86 06/30/20 1611  Resp 17 06/30/20 1611  SpO2 93 % 06/30/20 1611  Vitals shown include unvalidated device data.  Last Pain:  Vitals:   06/30/20 1109  TempSrc:   PainSc: 0-No pain         Complications:  Encounter Complications  Complication Outcome Phase Comment  None  Intraprocedure

## 2020-06-30 NOTE — Progress Notes (Signed)
Elevated HR in Pre-Op: Pt's HR elevated on arrival. Denies CP, SHOB, discomfort, no acute distress noted. Dr. Clifton James at bedside, made aware. Notified Dr. Krista Blue; verbal orders received for Metoprolol Tartrate (Lopressor) 50mg  PO.    06/30/20 0952 06/30/20 1005 06/30/20 1025  Vitals  Temp 98.3 F (36.8 C)  --   --   Temp Source Oral  --   --   Pulse Rate (!) 127  --  (!) 135  Pulse Rate Source Monitor  --   --   Resp 20  --   --   BP (!) 139/91  --  133/80 (Verbal Order for Metoprolol Tartrate (Lopressor) 50mg  PO per Dr. 07/02/20)  SpO2 96 %  --   --   Oxygen Therapy  O2 Device Room Air  --   --   Pain Assessment  Pain Scale  --  0-10  --   Pain Score  --  0  --

## 2020-06-30 NOTE — Anesthesia Procedure Notes (Signed)
Arterial Line Insertion Start/End5/31/2022 1:00 PM, 06/30/2020 1:08 PM Performed by: Elliot Dally, CRNA, CRNA  Patient location: Pre-op. Preanesthetic checklist: patient identified, IV checked, site marked, risks and benefits discussed, surgical consent, monitors and equipment checked, pre-op evaluation, timeout performed and anesthesia consent Left, radial was placed Catheter size: 20 G Hand hygiene performed  and maximum sterile barriers used  Allen's test indicative of satisfactory collateral circulation Attempts: 1 Procedure performed using ultrasound guided technique. Ultrasound Notes:anatomy identified, needle tip was noted to be adjacent to the nerve/plexus identified and no ultrasound evidence of intravascular and/or intraneural injection Following insertion, dressing applied and Biopatch. Post procedure assessment: normal  Post procedure complications: local hematoma. Patient tolerated the procedure well with no immediate complications. Additional procedure comments: Two attempts without ultrasound by another CRNA were unsuccessful in threading the catheter. Local hematoma present from these attempts. Easy placement with ultrasound. Marland Kitchen

## 2020-06-30 NOTE — Op Note (Signed)
[facility]   MULTIDISCIPLINARY HEART VALVE TEAM   TAVR OPERATIVE NOTE   Date of Procedure:  06/30/2020  Preoperative Diagnosis: Severe Aortic Stenosis   Postoperative Diagnosis: Same   Procedure:    Transcatheter Aortic Valve Replacement - Percutaneous Right Transfemoral Approach  Edwards Sapien 3 Ultra THV (size 23 mm, model # 9750TFX, serial # [id number])   Co-Surgeons: [name], MD and Salvatore Decent. Cornelius Moras, MD  Anesthesiologist:  Heather Roberts, MD  Echocardiographer:  Lennie Odor, MD  Pre-operative Echo Findings:  Paradoxical severe low flow, low gradient aortic stenosis  Normal left ventricular systolic function  Post-operative Echo Findings:  No paravalvular leak  Unchanged left ventricular systolic function   BRIEF CLINICAL NOTE AND INDICATIONS FOR SURGERY  Patient is 75 year old obese female with history of aortic stenosis, hypertension, hyperlipidemia, type 2 diabetes mellitus, anemia, rheumatoid arthritis, cerebrovascular disease, GE reflux disease, and permanent atrial fibrillation on Eliquis who was recently diagnosed with 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  The patient's atrial fibrillation has been difficult to manage medically with fluctuations in heart rate consistent with tachybradycardia syndrome.  She has been evaluated by the EP service who feel that an attempt at catheter-based ablation is probably not warranted.  Consideration of AV node ablation and permanent pacemaker placement has not been recommended at this time.  The patient states that since she has been in the hospital and medications adjusted she is feeling better.  She describes stable symptoms of exertional shortness of breath that occur with more strenuous exertion, consistent  with chronic diastolic congestive heart failure, New York Heart Association functional class II.  Her daughter reports that intermittently at home she has episodes where her breathing gets much worse.  I personally reviewed the patient's recent transthoracic echocardiogram, diagnostic cardiac catheterization, and CT angiograms.  Echocardiogram reveals severe aortic stenosis.  Left ventricular ejection fraction is estimated 55 to 60%.  The aortic valve is trileaflet with severe thickening, calcification, and restricted leaflet mobility involving all 3 leaflets of the aortic valve.  Peak velocity across aortic valve range considerably but was measured at size 2.8 m/s corresponding to mean transvalvular gradient estimated 29 mmHg but aortic valve area calculated only 0.73 cm by VTI.  The DVI was notably quite low at 0.23 and stroke-volume index only 19.  Diagnostic cardiac catheterization reveals no significant coronary artery disease.  Calculated valve area by catheterization was 1.0 cm with mean transvalvular gradient 14 mmHg.  Gated CT angiogram of the heart confirmed the presence of severe calcification of the aortic valve with severely restricted leaflet motion in systole.  The overall size of the aortic valve and aortic root is relatively small with annular area measured 374 mm.   CTA of the aorta and iliac vessels demonstrate what appears to be adequate pelvic vascular access to facilitate a transfemoral approach.  During the course of the patient's preoperative work up they have been evaluated comprehensively by a multidisciplinary team of specialists coordinated through the Multidisciplinary [facility] Valve Clinic in the [facility].  They have been demonstrated to suffer from symptomatic severe aortic stenosis as noted above. The patient has been counseled extensively as to the relative risks and benefits of all options for the treatment of severe aortic stenosis including long  term medical therapy, conventional surgery for aortic valve replacement, and  transcatheter aortic valve replacement.  All questions have been answered, and the patient provides full informed consent for the operation as described.   DETAILS OF THE OPERATIVE PROCEDURE  PREPARATION:    The patient is brought to the operating room on the above mentioned date and appropriate monitoring was established by the anesthesia team. The patient is placed in the supine position on the operating table.  Intravenous antibiotics are administered. The patient is monitored closely throughout the procedure under conscious sedation.  Baseline transthoracic echocardiogram was performed. The patient's chest, abdomen, both groins, and both lower extremities are prepared and draped in a sterile manner. A time out procedure is performed.   PERIPHERAL ACCESS:    Using the modified Seldinger technique, femoral arterial and venous access was obtained with placement of 6 Fr sheaths on the left side.  A pigtail diagnostic catheter was passed through the left arterial sheath under fluoroscopic guidance into the aortic root.  A temporary transvenous pacemaker catheter was passed through the left femoral venous sheath under fluoroscopic guidance into the right ventricle.  The pacemaker was tested to ensure stable lead placement and pacemaker capture. Aortic root angiography was performed in order to determine the optimal angiographic angle for valve deployment.   TRANSFEMORAL ACCESS:   Percutaneous transfemoral access and sheath placement was performed using ultrasound guidance.  The right common femoral artery was cannulated using a micropuncture needle and appropriate location was verified using hand injection angiogram.  A pair of Abbott Perclose percutaneous closure devices were placed and a 6 French sheath replaced into the femoral artery.  The patient was heparinized systemically and ACT verified > 250 seconds.    A 14 Fr  transfemoral E-sheath was introduced into the right common femoral artery after progressively dilating over an Amplatz superstiff wire. An AL-2 catheter was used to direct a straight-tip exchange length wire across the native aortic valve into the left ventricle. This was exchanged out for a pigtail catheter and position was confirmed in the LV apex. Simultaneous LV and Ao pressures were recorded.  The pigtail catheter was exchanged for an Amplatz Extra-stiff wire in the LV apex.  Echocardiography was utilized to confirm appropriate wire position and no sign of entanglement in the mitral subvalvular apparatus.   TRANSCATHETER HEART VALVE DEPLOYMENT:   An Edwards Sapien 3 Ultra transcatheter heart valve (size 23 mm, model #9750TFX, serial #[id number]) was prepared and crimped per manufacturer's guidelines, and the proper orientation of the valve is confirmed on the [employer] delivery system. The valve was advanced through the introducer sheath using normal technique until in an appropriate position in the abdominal aorta beyond the sheath tip. The balloon was then retracted and using the fine-tuning wheel was centered on the valve. The valve was then advanced across the aortic arch using appropriate flexion of the catheter. The valve was carefully positioned across the aortic valve annulus. The Commander catheter was retracted using normal technique. Once final position of the valve has been confirmed by angiographic assessment, the valve is deployed while temporarily holding ventilation and during rapid ventricular pacing to maintain systolic blood pressure < 50 mmHg and pulse pressure < 10 mmHg. The balloon inflation is held for >3 seconds after reaching full deployment volume. Once the balloon has fully deflated the balloon is retracted into the ascending aorta and valve function is assessed using echocardiography. There is felt to be no paravalvular leak and no central aortic insufficiency.  The  patient's hemodynamic recovery following valve deployment is good.  The deployment balloon and guidewire are both removed.    PROCEDURE COMPLETION:   The sheath was removed and femoral artery closure performed.  Protamine was administered once femoral arterial repair was complete. The temporary pacemaker, pigtail catheters and femoral sheaths were removed with manual pressure used for hemostasis.  A Mynx femoral closure device was utilized following removal of the diagnostic sheath in the left femoral artery.  The patient tolerated the procedure well and is transported to the surgical intensive care in stable condition. There were no immediate intraoperative complications. All sponge instrument and needle counts are verified correct at completion of the operation.   No blood products were administered during the operation.    Purcell Nails, MD 06/30/2020 4:00 PM

## 2020-06-30 NOTE — Discharge Instructions (Signed)
ACTIVITY AND EXERCISE °• Daily activity and exercise are an important part of your recovery. People recover at different rates depending on their general health and type of valve procedure. °• Most people recovering from TAVR feel better relatively quickly  °• No lifting, pushing, pulling more than 10 pounds (examples to avoid: groceries, vacuuming, gardening, golfing): °            - For one week with a procedure through the groin. °            - For six weeks for procedures through the chest wall or neck. °NOTE: You will typically see one of our providers 7-14 days after your procedure to discuss WHEN TO RESUME the above activities.  °  °  °DRIVING °• Do not drive until you are seen for follow up and cleared by a provider. Generally, we ask patient to not drive for 1 week after their procedure. °• If you have been told by your doctor in the past that you may not drive, you must talk with him/her before you begin driving again. °  °DRESSING °• Groin site: you may leave the clear dressing over the site for up to one week or until it falls off. °  °HYGIENE °• If you had a femoral (leg) procedure, you may take a shower when you return home. After the shower, pat the site dry. Do NOT use powder, oils or lotions in your groin area until the site has completely healed. °• If you had a chest procedure, you may shower when you return home unless specifically instructed not to by your discharging practitioner. °            - DO NOT scrub incision; pat dry with a towel. °            - DO NOT apply any lotions, oils, powders to the incision. °            - No tub baths / swimming for at least 2 weeks. °• If you notice any fevers, chills, increased pain, swelling, bleeding or pus, please contact your doctor. °  °ADDITIONAL INFORMATION °• If you are going to have an upcoming dental procedure, please contact our office as you will require antibiotics ahead of time to prevent infection on your heart valve.  ° ° °If you have any  questions or concerns you can call the structural heart phone during normal business hours 8am-4pm. If you have an urgent need after hours or weekends please call [phone] to talk to the on call provider for general cardiology. If you have an emergency that requires immediate attention, please call 911.  ° ° °After TAVR Checklist ° °Check  Test Description  ° Follow up appointment in 1-2 weeks  You will see our structural heart physician assistant, Katie Rondle Lohse. Your incision sites will be checked and you will be cleared to drive and resume all normal activities if you are doing well.    ° 1 month echo and follow up  You will have an echo to check on your new heart valve and be seen back in the office by Katie Diamonique Ruedas. Many times the echo is not read by your appointment time, but Katie will call you later that day or the following day to report your results.  ° Follow up with your primary cardiologist You will need to be seen by your primary cardiologist in the following 3-6 months after your 1 month appointment in the valve   clinic. Often times your Plavix or Aspirin will be discontinued during this time, but this is decided on a case by case basis.   ° 1 year echo and follow up You will have another echo to check on your heart valve after 1 year and be seen back in the office by Katie Suesan Mohrmann. This your last structural heart visit.  ° Bacterial endocarditis prophylaxis  You will have to take antibiotics for the rest of your life before all dental procedures (even teeth cleanings) to protect your heart valve. Antibiotics are also required before some surgeries. Please check with your cardiologist before scheduling any surgeries. Also, please make sure to tell us if you have a penicillin allergy as you will require an alternative antibiotic.   ° ° °

## 2020-07-01 ENCOUNTER — Encounter (HOSPITAL_COMMUNITY): Payer: Self-pay | Admitting: Cardiovascular Disease

## 2020-07-01 ENCOUNTER — Inpatient Hospital Stay (HOSPITAL_COMMUNITY): Payer: Medicare Other

## 2020-07-01 DIAGNOSIS — Z952 Presence of prosthetic heart valve: Secondary | ICD-10-CM | POA: Diagnosis not present

## 2020-07-01 DIAGNOSIS — I4821 Permanent atrial fibrillation: Secondary | ICD-10-CM | POA: Diagnosis not present

## 2020-07-01 DIAGNOSIS — Q2730 Arteriovenous malformation, site unspecified: Secondary | ICD-10-CM | POA: Diagnosis not present

## 2020-07-01 DIAGNOSIS — Z006 Encounter for examination for normal comparison and control in clinical research program: Secondary | ICD-10-CM | POA: Diagnosis not present

## 2020-07-01 DIAGNOSIS — I350 Nonrheumatic aortic (valve) stenosis: Secondary | ICD-10-CM | POA: Diagnosis not present

## 2020-07-01 DIAGNOSIS — I779 Disorder of arteries and arterioles, unspecified: Secondary | ICD-10-CM | POA: Diagnosis present

## 2020-07-01 LAB — BASIC METABOLIC PANEL
Anion gap: 13 (ref 5–15)
BUN: 18 mg/dL (ref 8–23)
CO2: 21 mmol/L — ABNORMAL LOW (ref 22–32)
Calcium: 9.7 mg/dL (ref 8.9–10.3)
Chloride: 103 mmol/L (ref 98–111)
Creatinine, Ser: 0.87 mg/dL (ref 0.44–1.00)
GFR, Estimated: >60 mL/min (ref >60)
Glucose, Bld: 172 mg/dL — ABNORMAL HIGH (ref 70–99)
Potassium: 4.9 mmol/L (ref 3.5–5.1)
Sodium: 137 mmol/L (ref 135–145)

## 2020-07-01 LAB — CBC
HCT: 41 % (ref 36.0–46.0)
Hemoglobin: 13.4 g/dL (ref 12.0–15.0)
MCH: 30.9 pg (ref 26.0–34.0)
MCHC: 32.7 g/dL (ref 30.0–36.0)
MCV: 94.7 fL (ref 80.0–100.0)
Platelets: 234 10*3/uL (ref 150–400)
RBC: 4.33 MIL/uL (ref 3.87–5.11)
RDW: 13.5 % (ref 11.5–15.5)
WBC: 7.5 10*3/uL (ref 4.0–10.5)
nRBC: 0 % (ref 0.0–0.2)

## 2020-07-01 LAB — GLUCOSE, CAPILLARY: Glucose-Capillary: 176 mg/dL — ABNORMAL HIGH (ref 70–99)

## 2020-07-01 LAB — ECHOCARDIOGRAM LIMITED
AR max vel: 2.32 cm2
AV Area VTI: 2.23 cm2
AV Area mean vel: 2.3 cm2
AV Mean grad: 11.3 mmHg
AV Peak grad: 19.4 mmHg
Ao pk vel: 2.21 m/s
Height: 68 in
Weight: 3304 oz

## 2020-07-01 LAB — MAGNESIUM: Magnesium: 2 mg/dL (ref 1.7–2.4)

## 2020-07-01 MED ORDER — DILTIAZEM HCL ER COATED BEADS 180 MG PO CP24
180.0000 mg | ORAL_CAPSULE | Freq: Every day | ORAL | Status: DC
  Administered 2020-07-01: 180 mg via ORAL
  Filled 2020-07-01: qty 1

## 2020-07-01 MED ORDER — APIXABAN 5 MG PO TABS: 5.0000 mg | ORAL_TABLET | Freq: Two times a day (BID) | ORAL | Status: DC

## 2020-07-01 MED ORDER — ASPIRIN 81 MG PO CHEW: 81.0000 mg | CHEWABLE_TABLET | Freq: Every day | ORAL | 1 refills | Status: DC

## 2020-07-01 NOTE — Progress Notes (Signed)
Pt eating breakfast, sts she walked x2 this am, the entire hall. Sts she tolerated well, no c/o. Does not use AD. Discussed restrictions, walking/exercise at home, and CRPII. Pt voiced understanding but at this time not interested in CRPII due to distance for her.  [id number] Ethelda Chick CES, ACSM 10:28 AM 07/01/2020

## 2020-07-01 NOTE — Progress Notes (Signed)
Echocardiogram 2D Echocardiogram has been performed.  Briana Foster Briana Foster 07/01/2020, 10:06 AM

## 2020-07-01 NOTE — Telephone Encounter (Signed)
Pt has not returned call.  Having echo today and seeing Carlean Jews, PA-C on 6/8.  Will close note.

## 2020-07-01 NOTE — Discharge Summary (Addendum)
[place] VALVE TEAM  Discharge Summary    Patient ID: Briana Foster MRN: [id number]; DOB: Feb 07, 1945  Admit date: 06/30/2020 Discharge date: 07/01/2020  Primary Care Provider: Wenda Low, MD  Primary Cardiologist: Sinclair Grooms, MD / Dr. Angelena Form & Dr. Roxy Manns (TAVR)  Discharge Diagnoses    Principal Problem:   S/P TAVR (transcatheter aortic valve replacement) Active Problems:   Class 1 obesity with serious comorbidity and body mass index (BMI) of 31.0 to 31.9 in adult   Essential hypertension   Type 2 diabetes mellitus without complication, without long-term current use of insulin (HCC)   Persistent atrial fibrillation (HCC)   Severe aortic stenosis   Rheumatoid arthritis ([place])   Aortic stenosis, severe   Carotid arterial disease (HCC)   Allergies Allergies  Allergen Reactions  . Bee Venom Shortness Of Breath and Rash  . Apricot Flavor Swelling    Eyes swell shut  . Atorvastatin Itching    Eye swelling  . Wasp Venom Other (See Comments)    Diagnostic Studies/Procedures    TAVR OPERATIVE NOTE   Date of Procedure:                06/30/2020  Preoperative Diagnosis:      Severe Aortic Stenosis   Postoperative Diagnosis:    Same   Procedure:        Transcatheter Aortic Valve Replacement - Percutaneous Right Transfemoral Approach             Edwards Sapien 3 Ultra THV (size 23 mm, model # 9750TFX, serial # [id number])              Co-Surgeons:            Lauree Chandler, MD and Valentina Gu. Roxy Manns, MD  Anesthesiologist:                  Duane Boston, MD  Echocardiographer:              Eleonore Chiquito, MD  Pre-operative Echo Findings: ? Paradoxical severe low flow, low gradient aortic stenosis ? Normal left ventricular systolic function  Post-operative Echo Findings: ? No paravalvular leak ? Unchanged left ventricular systolic function  _____________   Echo 07/01/20: complete but pending formal  read at the time of discharge    History of Present Illness     Briana Foster is a 75 y.o. female with a history of obesity, DMT2, HTN, HLD, anemia, carotid stenosis (60-79% RICA stenosis), rheumatoid arthritis, GERD, permanent atrial fibrillation on Eliquis and severe AS who presented to [facility] on 06/30/20 for planned TAVR.  She is followed by Dr. Tamala Julian and Dr. Quentin Ore in the outpatient setting for aortic stenosis and afib. She is felt to have permanent atrial fibrillation having failed to maintain NSR on amiodarone. A rate control strategy with metoprolol and long acting diltiazem was recommended. Echo 05/11/20 showed EF 55-60%, mild LVH, moderate MAC with trivial MR, severe LFLG AS with mean gradient 29 mm Hg, peak gradient 31 mm hg, AVA 0.73 cm2, DVI 0.23, SVI 19. Of note, she was in rapid afib during this echo evaluation. She complained of exertional fatigue and dyspnea. She was referred to Dr. Angelena Form for structural heart evaluation and seen on 06/10/20 and noted to have afib with RVR during that visit and started back on oral amio for better rate control. This was later discontinued due to nausea.  She underwent diagnostic L/RHC on 06/17/20 which showed widely  patent coronary arteries (vessels tortuous but with no significant obstruction). She was also noted to be in afib with RVR with rates varying between 101-160 bpm in the setting of not taking her oral medications that morning. She was treated with several doses of IV metoprolol. Variable RR interval made it difficult to appropriately assess left ventricular gradient. "Eyeball" peak to peak gradient is 20 mmHg. AV coronary and mitral annular calcification noted on cine fluoroscopy. Cardiac output by Fick 4.82 L/min with index 1.99 L/min/m. These findings are consistent with potential low-flow low gradient aortic stenosis as suspected. Calculated aortic valve area 1.0 cm based upon a mean gradient of 14 mmHg. She was discharged home after her  cath.  She then returned to the hospital on 5/22 via EMS for marked fatigue and generalized weakness and found to have afib with RVR. She was admitted from 5/21-5/25/22 for rate control and underwent TAVR work up and was evaluated by the multidisciplinary valve team. She was seen by EP who felt continued medical therapy was warranted (vs AV node ablation + PPM). Rates eventually controlled with just long acting cardizem and Lopressor. Plans were made for discharge home and to return 06/30/20 for TAVR.  Hospital Course     Consultants: none  Severe AS: s/p successful TAVR with a 23 mm Edwards Sapien 3 Ultra THV via the TF approach on 06/30/20. Post operative echo completed but pending formal read. Groin sites are stable. ECG with afib with RVR and no high grade heart block. Will resume on home Eliquis tonight and started on a baby aspirin 81 mg daily, which will be continued x 6 months. Plan for discharge home today with close follow up in office next week.  AFib with RVR: she has had a difficult time with rate control requiring admission last week. HR is elevated currently due to holding her long acting CCB. Will resume that now. She has had good rate control with Cardizem CD 171m daily and Lopressor 287mBID.  DMT2: treated with SSI while admitted. Resume home meds at discharge. Okay to resume Metformin after 48 hours after contrast dye exposure (6/2 PM)   HTN: BP well controlled.   Carotid artery disease: pre TAVR dopplers showed 02-05-76%ICA stenosis and 6046-96%ICA stenosis. Continue medical therapy.   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 This was noted on pre TAVR CT. Not sure there is anything to do about this.   _____________  Discharge Vitals Blood pressure (!) 116/98, pulse 91, temperature 98.8 F (37.1 C), temperature source Oral, resp. rate 18, height _0  (1.727 m),  weight 93.7 kg, SpO2 94 %.  Filed Weights   06/30/20 0952 07/01/20 0435  Weight: 95.4 kg 93.7 kg    GEN: Well nourished, well developed, in no acute distress, obese HEENT: normal Neck: no JVD or masses Cardiac: irreg irreg, tachy; no murmurs, rubs, or gallops,no edema  Respiratory:  clear to auscultation bilaterally, normal work of breathing GI: soft, nontender, nondistended, + BS MS: no deformity or atrophy Skin: warm and dry, no rash.  Groin sites clear without hematoma or ecchymosis  Neuro:  Alert and Oriented x 3, Strength and sensation are intact Psych: euthymic mood, full affect   Labs & Radiologic Studies    CBC Recent Labs    06/30/20 1631 07/01/20 0125  WBC  --  7.5  HGB 11.6* 13.4  HCT 34.0* 41.0  MCV  --  94.7  PLT  --  462   Basic Metabolic Panel Recent Labs    06/30/20 1631 07/01/20 0125  NA 141 137  K 4.1 4.9  CL 108 103  CO2  --  21*  GLUCOSE 150* 172*  BUN 15 18  CREATININE 0.70 0.87  CALCIUM  --  9.7  MG  --  2.0   Liver Function Tests No results for input(s): AST, ALT, ALKPHOS, BILITOT, PROT, ALBUMIN in the last 72 hours. No results for input(s): LIPASE, AMYLASE in the last 72 hours. Cardiac Enzymes No results for input(s): CKTOTAL, CKMB, CKMBINDEX, TROPONINI in the last 72 hours. BNP Invalid input(s): POCBNP D-Dimer No results for input(s): DDIMER in the last 72 hours. Hemoglobin A1C No results for input(s): HGBA1C in the last 72 hours. Fasting Lipid Panel No results for input(s): CHOL, HDL, LDLCALC, TRIG, CHOLHDL, LDLDIRECT in the last 72 hours. Thyroid Function Tests No results for input(s): TSH, T4TOTAL, T3FREE, THYROIDAB in the last 72 hours.  Invalid input(s): FREET3 _____________  DG Chest 2 View  Result Date: 06/27/2020 CLINICAL DATA:  75 year old female under preoperative evaluation prior to TAVR procedure. EXAM: CHEST - 2 VIEW COMPARISON:  Chest x-ray 06/21/2020. FINDINGS: Lung volumes are normal. No consolidative airspace  disease. No pleural effusions. No pneumothorax. No pulmonary nodule or mass noted. Pulmonary vasculature and the cardiomediastinal silhouette are within normal limits. Atherosclerosis in the thoracic aorta. IMPRESSION: 1.  No radiographic evidence of acute cardiopulmonary disease. 2. Aortic atherosclerosis. Electronically Signed   By: Vinnie Langton M.D.   On: 06/27/2020 18:32   CARDIAC CATHETERIZATION  Result Date: 06/17/2020  Very poor rate control related to patient not taking medications.  Atrial fibrillation with RVR varying between 101 160 bpm.  Therefore, the patient was treated with IV metoprolol receiving 15 mg and 5 mg doses over 30 minutes before proceeding.  This decrease the heart rate into the 90 to 115 bpm range.  Widely patent coronary arteries.  Vessels are tortuous but have no significant obstruction.  Normal pulmonary artery pressures with mean pulmonary wedge 23 mmHg (influenced by rate).  Variable RR interval made it difficult to appropriately assess left ventricular gradient.  "Eyeball" peak to peak gradient is 20 mmHg.  AV coronary and mitral annular calcification noted on cine fluoroscopy.  Cardiac output by Fick 4.82 L/min with index 1.99 L/min/m.  These findings are consistent with potential low-flow low gradient aortic stenosis as suspected.  Calculated aortic valve area 1.0 cm based upon a mean gradient of 14 mmHg. RECOMMENDATIONS:  Back to Dr. Angelena Form for CT imaging and referral to cardiac surgery for second opinion concerning management with TAVR versus SAVR.  CT CORONARY MORPH W/CTA COR W/SCORE W/CA W/CM &/OR WO/CM  Addendum Date: 06/22/2020   ADDENDUM REPORT: 06/22/2020 19:07 CLINICAL DATA:  Severe Aortic Stenosis. EXAM: Cardiac TAVR CT TECHNIQUE: A non-contrast, gated CT scan was obtained with axial slices of 3 mm through the heart for aortic valve calcium scoring. A 120 kV retrospective, gated, contrast cardiac scan was obtained. Gantry rotation speed was 250  msecs and collimation was 0.6 mm. Nitroglycerin was not given. The 3D data set was reconstructed in 5% intervals of the 0-95% of the R-R cycle. Systolic and diastolic phases were analyzed on a dedicated workstation using MPR, MIP, and VRT modes. The patient received 100 cc of contrast. FINDINGS: Image quality: Excellent. Noise artifact is: Limited. Valve Morphology: The aortic valve is tricuspid and severely calcified. There is bulky calcification of the [place] with  partial fusion of the NCC/LCC. The leaflets demonstrate severely restricted leaflet motion in systole. Aortic Valve Calcium score: 1083 Aortic annular dimension: Phase assessed: 25% Annular area: 374 mm2 Annular perimeter: 70.7 mm Max diameter: 25.5 mm Min diameter: 18.3 mm Annular and subannular calcification: None. Optimal coplanar projection: LAO 10 CRA 21 Coronary Artery Height above Annulus: Left Main: 12.5 mm Right Coronary: 15.2 mm Sinus of Valsalva Measurements: Non-coronary: 30 mm Right-coronary: 28 mm Left-coronary: 30 mm Sinus of Valsalva Height: Non-coronary: 21.9 mm Right-coronary: 21.5 mm Left-coronary: 18.0 mm Sinotubular Junction: 25 mm Ascending Thoracic Aorta: 31 mm Coronary Arteries: Normal coronary origin. Right dominance. The study was performed without use of NTG and is insufficient for plaque evaluation. Please refer to recent cardiac catheterization for coronary assessment. Cardiac Morphology: Right Atrium: Right atrial size is dilated. Right Ventricle: The right ventricular cavity is within normal limits. Left Atrium: Left atrial size is dilated. There is mixing artifact in the LAA, cannot exclude thrombus. Left Ventricle: The ventricular cavity size is within normal limits. There are no stigmata of prior infarction. There is no abnormal filling defect. Normal left ventricular function, EF=59%. No regional wall motion abnormalities. Pulmonary arteries: Normal in size without proximal filling defect. Pulmonary veins: Normal pulmonary  venous drainage. Pericardium: Normal thickness with no significant effusion or calcium present. Mitral Valve: The mitral valve is degenerative with moderate to severe annular calcification. Extra-cardiac findings: See attached radiology report for non-cardiac structures. IMPRESSION: 1. 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 Bulky calcification noted on the [place]. 2. Annular measurements appropriate for 23 mm Sapien 3 TAVR (374 mm2). 3. No significant annular or subannular calcifications. 4. Sufficient coronary to annulus distance. 5. Optimal Fluoroscopic Angle for Delivery: LAO 10 CRA 21 6. There is mixing artifact in the LAA, cannot exclude thrombus. 7. The mitral valve is degenerative with moderate to severe annular calcification. [place] T. Audie Box, MD Electronically Signed   By: Eleonore Chiquito   On: 06/22/2020 19:07   Result Date: 06/22/2020 EXAM: OVER-READ INTERPRETATION  CT CHEST The following report is an over-read performed by radiologist Dr. Vinnie Langton of [facility] [place] on 06/22/2020. This over-read does not include interpretation of cardiac or coronary anatomy or pathology. The coronary calcium score/coronary CTA interpretation by the cardiologist is attached. COMPARISON:  None. 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 Within the visualized portions of the thorax there are no suspicious appearing pulmonary nodules or masses, there is no acute consolidative airspace disease, no pleural effusions, no pneumothorax and no lymphadenopathy. Visualized portions of the upper abdomen are unremarkable. There are no aggressive appearing lytic or blastic lesions noted in the visualized portions of the skeleton. IMPRESSION: 1. 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 Electronically  Signed: By: Vinnie Langton M.D. On: 06/22/2020 16:27   DG Chest Portable 1 View  Result Date: 06/21/2020 CLINICAL DATA:  Shortness of breath. EXAM: PORTABLE CHEST 1 VIEW COMPARISON:  December 07, 2016 FINDINGS: Mild, chronic appearing increased lung markings are seen without evidence of focal consolidation, pleural effusion or pneumothorax. Very mild peribronchial thickening is noted within the perihilar regions, bilaterally. The cardiac silhouette is mildly enlarged. The visualized skeletal structures are unremarkable. IMPRESSION: Mild bilateral peribronchial thickening without an acute infiltrate. Electronically Signed   By: Virgina Norfolk M.D.   On: 06/21/2020 00:18  CT Angio Chest/Abd/Pel for Dissection W and/or Wo Contrast  Result Date: 06/21/2020 CLINICAL DATA:  Abdominal pain, aortic dissection suspected. weakness and a-fib. heart cath performed Wednesday. EXAM: CT ANGIOGRAPHY CHEST, ABDOMEN AND PELVIS TECHNIQUE: Non-contrast CT of the chest was initially obtained. Multidetector CT imaging through the chest, abdomen and pelvis was performed using the standard protocol during bolus administration of intravenous contrast. Multiplanar reconstructed images and MIPs were obtained and reviewed to evaluate the vascular anatomy. CONTRAST:  116m OMNIPAQUE IOHEXOL 350 MG/ML SOLN COMPARISON:  None. FINDINGS: CTA CHEST FINDINGS Cardiovascular: Satisfactory opacification of the pulmonary arteries to the segmental level. No evidence of pulmonary embolism. Satisfactory opacification of the aorta with no aneurysm or dissection. Mild cardiomegaly. Question patent foramen ovale. No pericardial effusion. Four-vessel coronary artery calcifications. Mitral annular calcifications at least moderate. Mediastinum/Nodes: Multiple borderline enlarged mediastinal lymph nodes: 0.9 cm right paratracheal (7:45). Prominent left hilar lymph nodes. Likely enlarged right hilar lymph node: 1.3 cm (7:67). No enlarged axillary lymph  nodes. Thyroid gland, trachea, and esophagus demonstrate no significant findings. Lungs/Pleura: Bilateral lower lobe subsegmental atelectasis. Few scattered calcified and noncalcified pulmonary micronodules (6:34, levin). No pulmonary mass. No focal consolidation. No pleural effusion. No pneumothorax. 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 Diffuse bronchial wall thickening. Musculoskeletal: No chest wall abnormality. No suspicious lytic or blastic osseous lesions. No acute displaced fracture. Multilevel degenerative changes of the spine. Degenerative changes of bilateral shoulders. Review of the MIP images confirms the above findings. CTA ABDOMEN AND PELVIS FINDINGS VASCULAR Aorta: At least moderate atherosclerotic plaque. Normal caliber aorta without aneurysm, dissection, vasculitis or significant stenosis. 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 Patent without evidence of aneurysm, dissection, vasculitis or significant stenosis. SMA: Patent without evidence of aneurysm, dissection, vasculitis or significant stenosis. Renals: Atherosclerotic plaque. Both renal arteries are patent without evidence of aneurysm, dissection, vasculitis, fibromuscular dysplasia or significant stenosis. IMA: Patent without evidence of aneurysm, dissection, vasculitis or significant stenosis. Inflow: Patent without evidence of aneurysm, dissection, vasculitis or significant stenosis. Veins: No obvious venous abnormality within the limitations of this arterial phase study. Review of the MIP images confirms the above findings. NON-VASCULAR Hepatobiliary: No focal liver abnormality. No gallstones, gallbladder wall thickening, or pericholecystic fluid. No biliary dilatation. Pancreas: No focal lesion. Normal pancreatic contour. No surrounding inflammatory changes. No main pancreatic ductal dilatation. Spleen: Normal in size without  focal abnormality. Adrenals/Urinary Tract: No adrenal nodule bilaterally. Bilateral kidneys enhance symmetrically. No hydronephrosis. No hydroureter. The urinary bladder is unremarkable. Stomach/Bowel: Stomach is within normal limits. No evidence of bowel wall thickening or dilatation. Scattered colonic diverticulosis. Appendix appears normal. Lymphatic: No lymphadenopathy. Reproductive: Uterus and bilateral adnexa are unremarkable. Other: No intraperitoneal free fluid. No intraperitoneal free gas. No organized fluid collection. Musculoskeletal: No abdominal wall hernia or abnormality. No suspicious lytic or blastic osseous lesions. No acute displaced fracture. Multilevel degenerative changes of the spine. Review of the MIP images confirms the above findings. IMPRESSION: 1. No acute aortic abnormality. Aortic Atherosclerosis (ICD10-I70.0) including four-vessel coronary artery calcifications and moderate mitral annular calcifications. 2. No central or segmental pulmonary embolus. 3. Nonspecific right hilar lymphadenopathy and prominent left hilar/mediastinal lymph nodes. Findings may be reactive in etiology. Recommend attention on follow-up. 4. 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 Correlation with prior cross-sectional imaging would be of value. Recommend attention on follow-up. 5. Mild cardiomegaly with question of patent foramen ovale.  6. Scattered colonic diverticulosis with no acute diverticulitis. Electronically Signed   By: Iven Finn M.D.   On: 06/21/2020 04:21   VAS US CAROTID  Result Date: 06/22/2020 Carotid Arterial Duplex Study Patient Name:  Briana Foster  Date of Exam:   06/22/2020 Medical Rec #: [id number]         Accession #:    [id number] Date of Birth: 08-14-1945        Patient Gender: F Patient Age:   74Y Exam Location:  [facility] Procedure:      VAS US CAROTID Referring Phys: [id number] [place]  --------------------------------------------------------------------------------  Indications:       Severe aortic stenosis. Risk Factors:      Hypertension, hyperlipidemia, Diabetes. Comparison Study:  no prior Performing Technologist: Abram Sander RVS  Examination Guidelines: A complete evaluation includes B-mode imaging, spectral Doppler, color Doppler, and power Doppler as needed of all accessible portions of each vessel. Bilateral testing is considered an integral part of a complete examination. Limited examinations for reoccurring indications may be performed as noted.  Right Carotid Findings: +----------+--------+--------+--------+-------------------------+--------+           PSV cm/sEDV cm/sStenosisPlaque Description       Comments +----------+--------+--------+--------+-------------------------+--------+ CCA Prox  44      13              heterogenous                      +----------+--------+--------+--------+-------------------------+--------+ CCA Distal68      26              heterogenous and calcific         +----------+--------+--------+--------+-------------------------+--------+ ICA Prox  257     96      60-79%  heterogenous and calcific         +----------+--------+--------+--------+-------------------------+--------+ ICA Mid   168     78                                                +----------+--------+--------+--------+-------------------------+--------+ ICA Distal103     36                                                +----------+--------+--------+--------+-------------------------+--------+ ECA       86      17                                                +----------+--------+--------+--------+-------------------------+--------+ +----------+--------+-------+--------+-------------------+           PSV cm/sEDV cmsDescribeArm Pressure (mmHG) +----------+--------+-------+--------+-------------------+ Subclavian80                                          +----------+--------+-------+--------+-------------------+ +---------+--------+--+--------+--+---------+ VertebralPSV cm/s70EDV cm/s18Antegrade +---------+--------+--+--------+--+---------+  Left Carotid Findings: +----------+--------+--------+--------+------------------+--------+           PSV cm/sEDV cm/sStenosisPlaque DescriptionComments +----------+--------+--------+--------+------------------+--------+ CCA Prox  92      29  heterogenous               +----------+--------+--------+--------+------------------+--------+ CCA Distal77      18              heterogenous               +----------+--------+--------+--------+------------------+--------+ ICA Prox  50      19      1-39%   heterogenous               +----------+--------+--------+--------+------------------+--------+ ICA Distal52      21                                         +----------+--------+--------+--------+------------------+--------+ ECA       55      12                                         +----------+--------+--------+--------+------------------+--------+ +----------+--------+--------+--------+-------------------+           PSV cm/sEDV cm/sDescribeArm Pressure (mmHG) +----------+--------+--------+--------+-------------------+ [id number]                                          +----------+--------+--------+--------+-------------------+ +---------+--------+--+--------+--+---------+ VertebralPSV cm/s47EDV cm/s23Antegrade +---------+--------+--+--------+--+---------+   Summary: Right Carotid: Velocities in the right ICA are consistent with a 60-79%                stenosis. Left Carotid: Velocities in the left ICA are consistent with a 1-39% stenosis. Vertebrals: Bilateral vertebral arteries demonstrate antegrade flow. *See table(s) above for measurements and observations.  Electronically signed by Monica Martinez MD on 06/22/2020 at 3:36:32 PM.     Final    ECHOCARDIOGRAM LIMITED  Result Date: 06/30/2020    ECHOCARDIOGRAM LIMITED REPORT   Patient Name:   Briana Foster Date of Exam: 06/30/2020 Medical Rec #:  [id number]        Height:       68.0 in Accession #:    [id number]       Weight:       210.3 lb Date of Birth:  24-May-1945       BSA:          2.088 m Patient Age:    47 years         BP:           117/86 mmHg Patient Gender: F                HR:           95 bpm. Exam Location:  Inpatient Procedure: Limited Echo, Cardiac Doppler, Color Doppler and 3D Echo Indications:    Aortic Stenosis  History:        Patient has prior history of Echocardiogram examinations, most                 recent 06/22/2020. Signs/Symptoms:Murmur and Shortness of Breath;                 Risk Factors:Diabetes, Dyslipidemia and Hypertension. GERD.                 Aortic Valve: 23 mm Sapien prosthetic, stented (TAVR) valve is  present in the aortic position. Procedure Date: 06/30/2020.  Sonographer:    Darlina Sicilian RDCS Referring Phys: [place]  1. Limited TTE for TAVR. Severe calcific AS is present. Paradoxical low-flow low-gradient (SVI = 13 cc/m2). Vmax 2.7 m/s, MG 21 mmHG, AVA 0.52 cm2, DI 0.29. 23 mm S3 deployed. Post deployment V max 1.0 m/s, MG 2 mmHG, EOA 2.4 cm2, DI 0.63. No paravalvular leak detected. Normal prosthesis. The aortic valve is tricuspid. There is severe calcifcation of the aortic valve. There is severe thickening of the aortic valve. Severe aortic valve stenosis. 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 Procedure Date: 06/30/2020.  2. Left ventricular ejection fraction, by estimation, is 60 to 65%. The left ventricle has normal function.  3. Right ventricular systolic function is normal. The right ventricular size is normal. There is normal pulmonary artery systolic pressure.  4. The mitral valve is degenerative. Trivial mitral valve regurgitation. No evidence of mitral  stenosis. Moderate to severe mitral annular calcification. FINDINGS  Left Ventricle: Left ventricular ejection fraction, by estimation, is 60 to 65%. The left ventricle has normal function. Right Ventricle: The right ventricular size is normal. No increase in right ventricular wall thickness. Right ventricular systolic function is normal. There is normal pulmonary artery systolic pressure. The tricuspid regurgitant velocity is 2.31 m/s, and  with an assumed right atrial pressure of 8 mmHg, the estimated right ventricular systolic pressure is 42.5 mmHg. Pericardium: Trivial pericardial effusion is present. Mitral Valve: The mitral valve is degenerative in appearance. Moderate to severe mitral annular calcification. Trivial mitral valve regurgitation. No evidence of mitral valve stenosis. Tricuspid Valve: The tricuspid valve is grossly normal. Tricuspid valve regurgitation is mild . No evidence of tricuspid stenosis. Aortic Valve: Limited TTE for TAVR. Severe calcific AS is present. Paradoxical low-flow low-gradient (SVI = 13 cc/m2). Vmax 2.7 m/s, MG 21 mmHG, AVA 0.52 cm2, DI 0.29. 23 mm S3 deployed. Post deployment V max 1.0 m/s, MG 2 mmHG, EOA 2.4 cm2, DI 0.63. No paravalvular leak detected. Normal prosthesis. The aortic valve is tricuspid. There is severe calcifcation of the aortic valve. There is severe thickening of the aortic valve. Severe aortic stenosis is present. Aortic valve mean gradient measures 11.5 mmHg. Aortic valve peak gradient measures 14.6 mmHg. Aortic valve area, by VTI measures 1.22 cm. There is a 23 mm Sapien prosthetic, stented (TAVR) valve present in the aortic position. Procedure Date: 06/30/2020. Pulmonic Valve: The pulmonic valve was not assessed. LEFT VENTRICLE PLAX 2D LVOT diam:     2.20 cm LV SV:         48 LV SV Index:   23 LVOT Area:     3.80 cm                         3D Volume EF:                         3D EF:        51 %                         LV EDV:       119 ml                          LV ESV:       58 ml  LV SV:        60 ml AORTIC VALVE AV Area (Vmax):    1.49 cm AV Area (Vmean):   1.29 cm AV Area (VTI):     1.22 cm AV Vmax:           190.75 cm/s AV Vmean:          143.300 cm/s AV VTI:            0.388 m AV Peak Grad:      14.6 mmHg AV Mean Grad:      11.5 mmHg LVOT Vmax:         74.80 cm/s LVOT Vmean:        48.650 cm/s LVOT VTI:          0.125 m LVOT/AV VTI ratio: 0.32 TRICUSPID VALVE TR Peak grad:   21.3 mmHg TR Vmax:        231.00 cm/s  SHUNTS Systemic VTI:  0.12 m Systemic Diam: 2.20 cm Eleonore Chiquito MD Electronically signed by Eleonore Chiquito MD Signature Date/Time: 06/30/2020/3:51:02 PM    Final    ECHOCARDIOGRAM LIMITED  Result Date: 06/22/2020    ECHOCARDIOGRAM LIMITED REPORT   Patient Name:   Briana Foster Date of Exam: 06/22/2020 Medical Rec #:  [id number]        Height:       68.0 in Accession #:    [id number]       Weight:       212.1 lb Date of Birth:  August 18, 1945       BSA:          2.095 m Patient Age:    37 years         BP:           121/95 mmHg Patient Gender: F                HR:           54 bpm. Exam Location:  Inpatient Procedure: Limited Echo, Cardiac Doppler and Limited Color Doppler Indications:    I35.0 Nonrheumatic aortic (valve) stenosis  History:        Patient has prior history of Echocardiogram examinations, most                 recent 05/11/2020. Cardiomegaly, Signs/Symptoms:Murmur; Risk                 Factors:Hypertension, Diabetes, Dyslipidemia and GERD.  Sonographer:    Jonelle Sidle Dance Referring Phys: [place]  1. Left ventricular ejection fraction, by estimation, is 55%. The left ventricle has no regional wall motion abnormalities. There is mild left ventricular hypertrophy. Left ventricular diastolic parameters are indeterminate.  2. Right ventricular systolic function is mildly reduced. The right ventricular size is mildly enlarged. Peak RV-RA gradient 26 mmHg.  3. The mitral valve is abnormal. Mild  mitral valve regurgitation. Moderate mitral annular and valvular calcification. Visually, there appears to be mitral stenosis but gradient was not measured. Would consider additional images to assess degree of mitral stenosis.  4. Tricuspid valve regurgitation is moderate to severe.  5. The aortic valve is tricuspid. Aortic valve regurgitation is not visualized. Severe aortic valve stenosis. Aortic valve area, by VTI measures 0.70 cm. Aortic valve mean gradient measures 38.0 mmHg.  6. The IVC was not visualized. FINDINGS  Left Ventricle: Left ventricular ejection fraction, by estimation, is 55%. The left ventricle has no regional wall motion abnormalities. The left ventricular internal cavity  size was normal in size. There is mild left ventricular hypertrophy. Left ventricular diastolic parameters are indeterminate. Right Ventricle: The right ventricular size is mildly enlarged. No increase in right ventricular wall thickness. Right ventricular systolic function is mildly reduced. Pericardium: There is no evidence of pericardial effusion. Mitral Valve: The mitral valve is abnormal. There is moderate calcification of the mitral valve leaflet(s). Moderate mitral annular calcification. Mild mitral valve regurgitation. Tricuspid Valve: The tricuspid valve is normal in structure. Tricuspid valve regurgitation is moderate to severe. Aortic Valve: The aortic valve is tricuspid. Aortic valve regurgitation is not visualized. Severe aortic stenosis is present. Aortic valve mean gradient measures 38.0 mmHg. Aortic valve peak gradient measures 58.1 mmHg. Aortic valve area, by VTI measures  0.70 cm. Pulmonic Valve: The pulmonic valve was normal in structure. Pulmonic valve regurgitation is trivial. Aorta: The aortic root is normal in size and structure. Venous: The inferior vena cava was not well visualized. LEFT VENTRICLE PLAX 2D LVIDd:         4.10 cm LVIDs:         3.10 cm LV PW:         1.40 cm LV IVS:        1.00 cm LVOT  diam:     2.00 cm LV SV:         64 LV SV Index:   30 LVOT Area:     3.14 cm  LEFT ATRIUM         Index LA diam:    4.50 cm 2.15 cm/m  AORTIC VALVE AV Area (Vmax):    0.75 cm AV Area (Vmean):   0.71 cm AV Area (VTI):     0.70 cm AV Vmax:           381.20 cm/s AV Vmean:          273.000 cm/s AV VTI:            0.910 m AV Peak Grad:      58.1 mmHg AV Mean Grad:      38.0 mmHg LVOT Vmax:         90.60 cm/s LVOT Vmean:        61.900 cm/s LVOT VTI:          0.203 m LVOT/AV VTI ratio: 0.22  AORTA Ao Root diam: 2.80 cm Ao Asc diam:  3.20 cm TRICUSPID VALVE TR Peak grad:   26.6 mmHg TR Vmax:        258.00 cm/s  SHUNTS Systemic VTI:  0.20 m Systemic Diam: 2.00 cm Loralie Champagne MD Electronically signed by Loralie Champagne MD Signature Date/Time: 06/22/2020/9:08:39 PM    Final    Structural Heart Procedure  Result Date: 06/30/2020 Placement 23 mm Edwards Sapien 3 THV from the right transfemoral approach. See operative note in the patient's chart  Disposition   Pt is being discharged home today in good condition.  Follow-up Plans & Appointments     Follow-up Information    [name], PA-C. Go on 07/08/2020.   Specialties: Cardiology, Radiology Why: @ 1pm, please arrive at least 10 minutes early. Contact information: [place] [id number] [phone]                Discharge Medications   Allergies as of 07/01/2020      Reactions   Bee Venom Shortness Of Breath, Rash   Apricot Flavor Swelling   Eyes swell shut   Atorvastatin Itching   Eye  swelling   Wasp Venom Other (See Comments)      Medication List    TAKE these medications   albuterol 108 (90 Base) MCG/ACT inhaler Commonly known as: VENTOLIN HFA Inhale 2 puffs into the lungs every 4 (four) hours as needed for wheezing or shortness of breath.   ALLERGY EYE OP Place 1 drop into both eyes daily as needed (red/itchy eyes).   apixaban 5 MG Tabs tablet Commonly known as: ELIQUIS Take 1  tablet (5 mg total) by mouth 2 (two) times daily.   aspirin 81 MG chewable tablet Chew 1 tablet (81 mg total) by mouth daily.   cetirizine 10 MG tablet Commonly known as: ZYRTEC Take 10 mg by mouth daily as needed for allergies.   diclofenac Sodium 1 % Gel Commonly known as: VOLTAREN Apply 1 application topically 2 (two) times daily as needed (pain).   diltiazem 180 MG 24 hr capsule Commonly known as: CARDIZEM CD Take 1 capsule (180 mg total) by mouth in the morning and at bedtime.   metFORMIN 750 MG 24 hr tablet Commonly known as: GLUCOPHAGE-XR Take 750 mg by mouth daily in the afternoon. Notes to patient: Okay to resume Metformin after 48 hours after contrast dye exposure (6/2 PM)    metoprolol tartrate 50 MG tablet Commonly known as: LOPRESSOR Take 1 tablet (50 mg total) by mouth 2 (two) times daily.   multivitamin with minerals tablet Take 1 tablet by mouth daily.   pantoprazole 40 MG tablet Commonly known as: PROTONIX Take 40 mg by mouth daily as needed (heartburn).   rOPINIRole 4 MG tablet Commonly known as: REQUIP Take 4 mg by mouth at bedtime.   rosuvastatin 20 MG tablet Commonly known as: CRESTOR Take 20 mg by mouth daily in the afternoon.   vitamin C 500 MG tablet Commonly known as: ASCORBIC ACID Take 500 mg by mouth daily.         Outstanding Labs/Studies   none  Duration of Discharge Encounter   Greater than 30 minutes including physician time.  Signed, Angelena Form, PA-C 07/01/2020, 9:34 AM [phone]  I have personally seen and examined this patient. I agree with the assessment and plan as outlined above. She is doing well this am post TAVR with placement of a 23 mm Edwards Sapien THV from the transfemoral approach. Groins stable. BP stable. Heart rate controlled. Discharge home today.   Lauree Chandler 07/01/2020 10:05 AM

## 2020-07-01 NOTE — Care Management (Signed)
Patient provided with 30 day Eliquis card.  

## 2020-07-02 ENCOUNTER — Telehealth: Payer: Self-pay | Admitting: Physician Assistant

## 2020-07-02 NOTE — Telephone Encounter (Signed)
  [facility]   MULTIDISCIPLINARY HEART VALVE TEAM   Attempted TOC call.   Ariele Vidrio PA-C  [facility]   

## 2020-07-03 NOTE — Telephone Encounter (Signed)
2nd TOC Call attempted.  Left voicemail for pt to contact the office with any questions or concerns.

## 2020-07-03 NOTE — Telephone Encounter (Signed)
Patient contacted regarding discharge from [facility] on 07/01/2020.  Patient understands to follow up with provider Carlean Jews PA-C on 07/08/2020 at 1:00 PM at [facility] office. Patient understands discharge instructions? yes Patient understands medications and regiment? yes Patient understands to bring all medications to this visit? yes

## 2020-07-06 NOTE — Progress Notes (Signed)
[place]                                     Cardiology Office Note:    Date:  07/08/2020   ID:  Briana Foster, DOB Nov 29, 1945, MRN [id number]  PCP:  Wenda Low, MD  [facility] Cardiologist:  Sinclair Grooms, MD / Dr. Angelena Form & Dr. Roxy Manns (TAVR) [facility] Electrophysiologist:  Vickie Epley, MD   Referring MD: Wenda Low, MD   [facility] s/p TAVR  History of Present Illness:    Briana Foster is a 75 y.o. female with a hx of obesity,DMT2,HTN,HLD, anemia,carotid stenosis(60-79% RICA stenosis), rheumatoidarthritis, GERD, permanent atrial fibrillation on Eliquisand severe AS s/p TAVR (06/30/20) who presents to clinic for follow up.  She is followed by Dr. Tamala Julian and Dr. Quentin Ore in the outpatient setting for aortic stenosis and afib. She is felt to have permanent atrial fibrillation having failed to maintain NSR on amiodarone. A rate control strategy with metoprolol and long acting diltiazem was recommended. Echo 05/11/20 showed EF 55-60%, mild LVH, moderate MAC with trivial MR, severe LFLG AS with mean gradient 29 mm Hg, peak gradient 31 mm hg, AVA 0.73 cm2, DVI 0.23, SVI 19. Of note, she was in rapid afib during this echo evaluation. She complained of exertional fatigue and dyspnea. She was referred to Dr. Angelena Form for structural heart evaluation and seen on 06/10/20 and noted to have afib with RVR during that visit and started back on oral amio for better rate control.This was later discontinued due to nausea.  She underwent diagnostic L/RHC on 06/17/20 which showed widely patent coronary arteries (vessels tortuous but with no significant obstruction). She was also noted to be in afib with RVR with rates varying between 101-160 bpm in the setting of not taking her oral medications that morning. She was treated with several doses of IV metoprolol.Variable RR interval made it difficult to appropriately assess left  ventricular gradient. "Eyeball" peak to peak gradient is 20 mmHg.AV coronary and mitral annular calcification noted on cine fluoroscopy.Cardiac output by Fick 4.82 L/min with index 1.99 L/min/m. These findings are consistent with potential low-flow low gradient aortic stenosis as suspected. Calculated aortic valve area 1.0 cm based upon a mean gradient of 14 mmHg.She was discharged homeafter her cath.  She then returned to the hospital on 5/22 via EMS for marked fatigue and generalized weakness and found to have afib with RVR. She was admitted from 5/21-5/25/22 for rate control and underwent TAVR work up and was evaluated by the multidisciplinary valve team. She was seen by EP who felt continued medical therapy was warranted (vs AV node ablation + PPM). Rates eventually controlled with just long acting cardizem and Lopressor. Plans were made for discharge home and to return 06/30/20 for TAVR.  She underwent successful TAVR with a 23 mm Edwards Sapien 3 Ultra THV via the TF approach on 06/30/20. Post operative echo showed EF 55%, normally functioning TAVR with a mean gradient of 11 mmHg and no PVL. She was resume on home AV nodal blocking agents as well as Eliquis and started on a baby aspirin 81 mg daily x 6 months.   Today she presents to clinic for follow up. Doing much better in terms of her breathing. Can tell a big difference. No CP or SOB. No LE edema, orthopnea or PND. No dizziness  or syncope. No blood in stool or urine. No palpitations.   Past Medical History:  Diagnosis Date  . Anemia   . Arthritis 07-20-12   hands  . Asthma   . Back pain   . Carotid arterial disease ([place])   . Class 1 obesity with serious comorbidity and body mass index (BMI) of 31.0 to 31.9 in adult 03/26/2018  . Depression   . Essential hypertension 03/26/2018  . GERD (gastroesophageal reflux disease)    "comes and goes" - no meds currently  . Hyperlipidemia   . Joint pain   . Persistent atrial fibrillation  ([place])   . Restless leg syndrome   . Rheumatoid arthritis ([place])   . 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 Angelena Form & Dr. Roxy Manns  . Severe aortic stenosis   . Type 2 diabetes mellitus without complication, without long-term current use of insulin ([place]) 04/10/2018  . Vitamin D deficiency     Past Surgical History:  Procedure Laterality Date  . BUNIONECTOMY  20 yrs ago   bil feet  . BUNIONECTOMY  11/30/2011   Procedure: Lillard Anes;  Surgeon: Magnus Sinning, MD;  Location: [facility];  Service: Orthopedics;  Laterality: Bilateral;  RIGHT FOOT EXCISION OF BUNIONETTE AND PARTIAL   PROXIMAL PHALANGECTOMY OF 5TH TOE LEFT FOOT FUNK BUNIONECTOMY,EXCISION OF BUNIONETTE   . CARDIOVERSION N/A 11/05/2019   Procedure: CARDIOVERSION;  Surgeon: Lelon Perla, MD;  [facility] ENDOSCOPY;  Service: Cardiovascular;  Laterality: N/A;  . CARDIOVERSION N/A 12/05/2019   Procedure: CARDIOVERSION;  Surgeon: Elouise Munroe, MD;  Location: [place];  Service: Cardiovascular;  Laterality: N/A;  . COLONOSCOPY WITH PROPOFOL N/A 08/07/2012   Procedure: COLONOSCOPY WITH PROPOFOL;  Surgeon: Garlan Fair, MD;  Location: [facility] ENDOSCOPY;  Service: Endoscopy;  Laterality: N/A;  . MOUTH SURGERY     teeth extractions  . RIGHT/LEFT HEART CATH AND CORONARY ANGIOGRAPHY N/A 06/17/2020   Procedure: RIGHT/LEFT HEART CATH AND CORONARY ANGIOGRAPHY;  Surgeon: Belva Crome, MD;  Location: [place] CV LAB;  Service: Cardiovascular;  Laterality: N/A;  . TONSILLECTOMY    . TRANSCATHETER AORTIC VALVE REPLACEMENT, TRANSFEMORAL N/A 06/30/2020   Procedure: TRANSCATHETER AORTIC VALVE REPLACEMENT, TRANSFEMORAL;  Surgeon: Burnell Blanks, MD;  Location: [place] CV LAB;  Service: Open Heart Surgery;  Laterality: N/A;  . TUBAL LIGATION    . WRIST SURGERY      Current Medications: Current Meds  Medication Sig  . albuterol (VENTOLIN  HFA) 108 (90 Base) MCG/ACT inhaler Inhale 2 puffs into the lungs every 4 (four) hours as needed for wheezing or shortness of breath.  Marland Kitchen apixaban (ELIQUIS) 5 MG TABS tablet Take 1 tablet (5 mg total) by mouth 2 (two) times daily.  Marland Kitchen aspirin 81 MG chewable tablet Chew 1 tablet (81 mg total) by mouth daily.  . cetirizine (ZYRTEC) 10 MG tablet Take 10 mg by mouth daily as needed for allergies.  Marland Kitchen diclofenac Sodium (VOLTAREN) 1 % GEL Apply 1 application topically 2 (two) times daily as needed (pain).  Marland Kitchen diltiazem (CARDIZEM CD) 180 MG 24 hr capsule Take 1 capsule (180 mg total) by mouth in the morning and at bedtime.  . metFORMIN (GLUCOPHAGE-XR) 750 MG 24 hr tablet Take 750 mg by mouth daily in the afternoon.  . metoprolol tartrate (LOPRESSOR) 50 MG tablet Take 1 tablet (50 mg total) by mouth 2 (two) times daily.  . Multiple  Vitamins-Minerals (MULTIVITAMIN WITH MINERALS) tablet Take 1 tablet by mouth daily.  Mable Fill (ALLERGY EYE OP) Place 1 drop into both eyes daily as needed (red/itchy eyes).  . pantoprazole (PROTONIX) 40 MG tablet Take 40 mg by mouth daily as needed (heartburn).  Marland Kitchen rOPINIRole (REQUIP) 4 MG tablet Take 4 mg by mouth at bedtime.  . rosuvastatin (CRESTOR) 20 MG tablet Take 20 mg by mouth daily in the afternoon.  . vitamin C (ASCORBIC ACID) 500 MG tablet Take 500 mg by mouth daily.     Allergies:   Bee venom, Apricot flavor, Atorvastatin, and Wasp venom   Social History   Socioeconomic History  . Marital status: Widowed    Spouse name: Not on file  . Number of children: 2  . Years of education: Not on file  . Highest education level: Not on file  Occupational History  . Occupation: Retired-worked at [employer]  . Smoking status: Never Smoker  . Smokeless tobacco: Never Used  Vaping Use  . Vaping Use: Never used  Substance and Sexual Activity  . Alcohol use: No  . Drug use: No  . Sexual activity: Not Currently  Other Topics Concern  . Not on  file  Social History Narrative  . Not on file   Social Determinants of Health   Financial Resource Strain: Not on file  Food Insecurity: Not on file  Transportation Needs: Not on file  Physical Activity: Not on file  Stress: Not on file  Social Connections: Not on file     Family History: The patient's family history includes Cancer in her father; Heart disease in her mother; Stroke in her mother.  ROS:   Please see the history of present illness.    All other systems reviewed and are negative.  EKGs/Labs/Other Studies Reviewed:    The following studies were reviewed today:  TAVR OPERATIVE NOTE   Date of Procedure:06/30/2020  Preoperative Diagnosis:Severe Aortic Stenosis   Postoperative Diagnosis:Same   Procedure:   Transcatheter Aortic Valve Replacement - PercutaneousRightTransfemoral Approach Edwards Sapien 3 Ultra THV (size 83m, model # 9750TFX, serial # [id number]  Co-Surgeons:Christopher McAlhany, MDandClarence H. ORoxy Manns MD  Anesthesiologist:James STobias Alexander MD  Echocardiographer: O'Neal, MD  Pre-operative Echo Findings: ? Paradoxical severelow flow, low gradientaortic stenosis ? Normalleft ventricular systolic function  Post-operative Echo Findings: ? Noparavalvular leak ? Unchangedleft ventricular systolic function  _____________   Echo 07/01/20: IMPRESSIONS  1. 23 mm S3 in the aortic position (06/30/2020). V max 2.2 m/s, MG 11  mmHG, EOA 2.2 cm2, DI 0.65. No regurgitation or paravalvular leak  detected. The aortic valve has been repaired/replaced. Aortic valve  regurgitation is not visualized. There is a 23 mm  Sapien prosthetic (TAVR) valve present in the aortic position. Procedure  Date: 06/30/2020. Echo findings are consistent with normal structure and  function of the aortic valve prosthesis.  2. Afib with RVR noted during  the study.  3. Left ventricular ejection fraction, by estimation, is 55 to 60%. The  left ventricle has normal function. The left ventricle has no regional  wall motion abnormalities.  4. Right ventricular systolic function is normal. The right ventricular  size is normal. There is normal pulmonary artery systolic pressure. The  estimated right ventricular systolic pressure is 116.6mmHg.  5. Left atrial size was moderately dilated.  6. The mitral valve is degenerative. Mild mitral valve regurgitation.  Moderate to severe mitral annular calcification.  7. The inferior vena cava is normal in size with greater  than 50%  respiratory variability, suggesting right atrial pressure of 3 mmHg.   EKG:  EKG is ordered today.  The ekg ordered today demonstrates afib with CVR HR 71   Recent Labs: 06/21/2020: B Natriuretic Peptide 250.5 06/26/2020: ALT 25 07/01/2020: BUN 18; Creatinine, Ser 0.87; Hemoglobin 13.4; Magnesium 2.0; Platelets 234; Potassium 4.9; Sodium 137  Recent Lipid Panel No results found for: CHOL, TRIG, HDL, CHOLHDL, VLDL, LDLCALC, LDLDIRECT   Risk Assessment/Calculations:    CHA2DS2-VASc Score = 6  This indicates a 9.7% annual risk of stroke. The patient's score is based upon: CHF History: Yes HTN History: Yes Diabetes History: Yes Stroke History: No Vascular Disease History: Yes Age Score: 1 Gender Score: 1      Physical Exam:    VS:  BP 132/76   Pulse 68   Ht 5' 7.5" (1.715 m)   Wt 209 lb (94.8 kg)   SpO2 96%   BMI 32.25 kg/m     Wt Readings from Last 3 Encounters:  07/08/20 209 lb (94.8 kg)  07/01/20 206 lb 8 oz (93.7 kg)  06/26/20 210 lb 4 oz (95.4 kg)     GEN:  Well nourished, well developed in no acute distress HEENT: Normal NECK: No JVD;  LYMPHATICS: No lymphadenopathy CARDIAC: irreg irreg, no murmurs, rubs, gallops RESPIRATORY:  Clear to auscultation without rales, wheezing or rhonchi  ABDOMEN: Soft, non-tender,  non-distended MUSCULOSKELETAL:  No edema; No deformity  SKIN: Warm and dry.  Groin sites clear without hematoma or ecchymosis  NEUROLOGIC:  Alert and oriented x 3 PSYCHIATRIC:  Normal affect   ASSESSMENT:    1. S/P TAVR (transcatheter aortic valve replacement)   2. Permanent atrial fibrillation ([place])   3. Essential hypertension   4. Stenosis of right carotid artery   5. AVM (arteriovenous malformation)    PLAN:    In order of problems listed above:  Severe AS s/p TAVR:doing great. Has had a big improvement in symptoms since TAVR. ECG with no HAVB. Groin sites healing well. SBE prophylaxis discussed; the patient is edentulous and does not go to the dentist. Continue on Eliquis and aspirin. I will see her back for 1 month follow up and echo later this month.   Chronic AFib with RVR: rate much better controlled today. Continue CCB and BB  HTN: BP well controlled. No changes made   Carotid artery disease: pre TAVR dopplers showed 2-48% LICA stenosis and 25-00% RICA stenosis. Continue medical therapy.   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 This was noted on pre TAVR CT. Not sure there is anything to do about this.    Medication Adjustments/Labs and Tests Ordered: Current medicines are reviewed at length with the patient today.  Concerns regarding medicines are outlined above.  No orders of the defined types were placed in this encounter.  No orders of the defined types were placed in this encounter.   Patient Instructions  Medication Instructions:  No changes *If you need a refill on your cardiac medications before your next appointment, please call your pharmacy*   Lab Work: none If you have labs (blood work) drawn today and your tests are completely normal, you will receive your results only by: Marland Kitchen MyChart Message (if you have MyChart) OR . A paper copy  in the mail If you have any lab test that is abnormal or we need to change your treatment, we will call you to  review the results.   Testing/Procedures: none   Follow-Up: As planned   Other Instructions      Signed, Angelena Form, PA-C  07/08/2020 2:06 PM    [place]

## 2020-07-08 ENCOUNTER — Ambulatory Visit: Payer: Medicare Other | Admitting: Physician Assistant

## 2020-07-08 ENCOUNTER — Other Ambulatory Visit: Payer: Self-pay

## 2020-07-08 ENCOUNTER — Encounter: Payer: Self-pay | Admitting: Physician Assistant

## 2020-07-08 VITALS — BP 132/76 | HR 68 | Ht 67.5 in | Wt 209.0 lb

## 2020-07-08 DIAGNOSIS — I4821 Permanent atrial fibrillation: Secondary | ICD-10-CM | POA: Diagnosis not present

## 2020-07-08 DIAGNOSIS — I6521 Occlusion and stenosis of right carotid artery: Secondary | ICD-10-CM

## 2020-07-08 DIAGNOSIS — Q2730 Arteriovenous malformation, site unspecified: Secondary | ICD-10-CM | POA: Diagnosis not present

## 2020-07-08 DIAGNOSIS — Z952 Presence of prosthetic heart valve: Secondary | ICD-10-CM

## 2020-07-08 DIAGNOSIS — I10 Essential (primary) hypertension: Secondary | ICD-10-CM | POA: Diagnosis not present

## 2020-07-08 NOTE — Patient Instructions (Signed)
Medication Instructions:  No changes *If you need a refill on your cardiac medications before your next appointment, please call your pharmacy*   Lab Work: none If you have labs (blood work) drawn today and your tests are completely normal, you will receive your results only by: . MyChart Message (if you have MyChart) OR . A paper copy in the mail If you have any lab test that is abnormal or we need to change your treatment, we will call you to review the results.   Testing/Procedures: none   Follow-Up: As planned  Other Instructions   

## 2020-07-10 NOTE — Addendum Note (Signed)
Addended by: Oleta Mouse on: 07/10/2020 09:08 AM   Modules accepted: Orders

## 2020-07-30 ENCOUNTER — Other Ambulatory Visit (HOSPITAL_COMMUNITY): Payer: Medicare Other

## 2020-07-30 ENCOUNTER — Other Ambulatory Visit: Payer: Self-pay

## 2020-07-30 ENCOUNTER — Ambulatory Visit: Payer: Medicare Other | Admitting: Physician Assistant

## 2020-07-30 ENCOUNTER — Encounter (HOSPITAL_COMMUNITY): Payer: Self-pay | Admitting: Physician Assistant

## 2020-07-30 ENCOUNTER — Encounter: Payer: Self-pay | Admitting: Physician Assistant

## 2020-07-30 VITALS — BP 122/80 | HR 93 | Ht 67.0 in | Wt 209.0 lb

## 2020-07-30 DIAGNOSIS — I4821 Permanent atrial fibrillation: Secondary | ICD-10-CM

## 2020-07-30 DIAGNOSIS — I10 Essential (primary) hypertension: Secondary | ICD-10-CM

## 2020-07-30 DIAGNOSIS — Z952 Presence of prosthetic heart valve: Secondary | ICD-10-CM

## 2020-07-30 DIAGNOSIS — I6521 Occlusion and stenosis of right carotid artery: Secondary | ICD-10-CM | POA: Diagnosis not present

## 2020-07-30 DIAGNOSIS — Q2730 Arteriovenous malformation, site unspecified: Secondary | ICD-10-CM

## 2020-07-30 NOTE — Patient Instructions (Addendum)
Medication Instructions:  Your physician has recommended you make the following change in your medication:  ON 12/30/2020 you can stop your Aspirin  *If you need a refill on your cardiac medications before your next appointment, please call your pharmacy*   Lab Work: None ordered   Testing/Procedures: Echocardiogram on 08/20/2020   Follow-Up: At [facility], you and your health needs are our priority.  As part of our continuing mission to provide you with exceptional heart care, we have created designated Provider Care Teams.  These Care Teams include your primary Cardiologist (physician) and Advanced Practice Providers (APPs -  Physician Assistants and Nurse Practitioners) who all work together to provide you with the care you need, when you need it.   Your next appointment:   6 month(s)  The format for your next appointment:   In Person  Provider:   Verdis Prime, MD    Thank you for choosing [facility]!!

## 2020-07-30 NOTE — Progress Notes (Signed)
[facility] [place]                                     Cardiology Office Note:    Date:  07/30/2020   ID:  Briana Foster, DOB 1945-06-03, MRN [id number]  PCP:  Wenda Low, MD  [facility] Cardiologist:  Sinclair Grooms, MD / Dr. Angelena Form & Dr. Roxy Manns (TAVR) [facility] Electrophysiologist:  Vickie Epley, MD   Referring MD: Wenda Low, MD   1 month s/p TAVR  History of Present Illness:    Briana Foster is a 75 y.o. female with a hx of obesity, DMT2, HTN, HLD, anemia, carotid stenosis (60-79% RICA stenosis), rheumatoid arthritis, GERD, permanent atrial fibrillation on Eliquis and severe AS s/p TAVR (06/30/20) who presents to clinic for follow up.  She is followed by Dr. Tamala Julian and Dr. Quentin Ore in the outpatient setting for aortic stenosis and afib. She is felt to have permanent atrial fibrillation having failed to maintain NSR on amiodarone. A rate control strategy with metoprolol and long acting diltiazem was recommended. Echo 05/11/20 showed EF 55-60%, mild LVH, moderate MAC with trivial MR, severe LFLG AS with mean gradient 29 mm Hg, peak gradient 31 mm hg, AVA 0.73 cm2, DVI 0.23, SVI 19. Of note, she was in rapid afib during this echo evaluation. She complained of exertional fatigue and dyspnea. She was referred to Dr. Angelena Form for structural heart evaluation and seen on 06/10/20 and noted to have afib with RVR during that visit and started back on oral amio for better rate control. This was later discontinued due to nausea.   She underwent diagnostic L/RHC on 06/17/20 which showed widely patent coronary arteries (vessels tortuous but with no significant obstruction). She was also noted to be in afib with RVR with rates varying between 101-160 bpm in the setting of not taking her oral medications that morning. She was treated with several doses of IV metoprolol. Variable RR interval made it difficult to appropriately assess  left ventricular gradient. "Ey[place]all" peak to peak gradient is 20 mmHg. AV coronary and mitral annular calcification noted on cine fluoroscopy. Cardiac output by Fick 4.82 L/min with index 1.99 L/min/m.  These findings are consistent with potential low-flow low gradient aortic stenosis as suspected.  Calculated aortic valve area 1.0 cm based upon a mean gradient of 14 mmHg. She was discharged home after her cath.   She then returned to the hospital on 5/22 via EMS for marked fatigue and generalized weakness and found to have afib with RVR. She was admitted from 5/21-5/25/22 for rate control and underwent TAVR work up and was evaluated by the multidisciplinary valve team. She was seen by EP who felt continued medical therapy was warranted (vs AV node ablation + PPM). Rates eventually controlled with just long acting cardizem and Lopressor. Plans were made for discharge home and to return 06/30/20 for TAVR.  She underwent successful TAVR with a 23 mm Edwards Sapien 3 Ultra THV via the TF approach on 06/30/20. Post operative echo showed EF 55%, normally functioning TAVR with a mean gradient of 11 mmHg and no PVL. She was resume on home AV nodal blocking agents as well as Eliquis and started on a baby aspirin 81 mg daily x 6 months.    Today she presents to clinic for follow up. Here alone. No CP. She does  have some mild SOB with exertion but much improved from before TAVR. No LE edema, orthopnea or PND. No dizziness or syncope. No blood in stool or urine. No palpitations. Getting ready to go on a cruise with her entire family.   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 Angelena Form & Dr. Roxy Manns   Severe aortic stenosis    Type 2 diabetes mellitus without complication, without long-term current use of insulin ([place]) 04/10/2018   Vitamin D deficiency     Past Surgical History:  Procedure Laterality Date   BUNIONECTOMY  20 yrs ago   bil feet   BUNIONECTOMY  11/30/2011   Procedure: Lillard Anes;  Surgeon: Magnus Sinning, MD;  Location: [facility];  Service: Orthopedics;  Laterality: Bilateral;  RIGHT FOOT EXCISION OF BUNIONETTE AND PARTIAL   PROXIMAL PHALANGECTOMY OF 5TH TOE LEFT FOOT FUNK BUNIONECTOMY,EXCISION OF BUNIONETTE    CARDIOVERSION N/A 11/05/2019   Procedure: CARDIOVERSION;  Surgeon: Lelon Perla, MD;  Location: [facility] ENDOSCOPY;  Service: Cardiovascular;  Laterality: N/A;   CARDIOVERSION N/A 12/05/2019   Procedure: CARDIOVERSION;  Surgeon: Elouise Munroe, MD;  Location: [place];  Service: Cardiovascular;  Laterality: N/A;   COLONOSCOPY WITH PROPOFOL N/A 08/07/2012   Procedure: COLONOSCOPY WITH PROPOFOL;  Surgeon: Garlan Fair, MD;  Location: [facility]OS[facility];  Service: Endoscopy;  Laterality: N/A;   MOUTH SURGERY     teeth extractions   RIGHT/LEFT HEART CATH AND CORONARY ANGIOGRAPHY N/A 06/17/2020   Procedure: RIGHT/LEFT HEART CATH AND CORONARY ANGIOGRAPHY;  Surgeon: Belva Crome, MD;  Location: [place] [facility];  Service: Cardiovascular;  Laterality: N/A;   TONSILLECTOMY     TRANSCATHETER AORTIC VALVE REPLACEMENT, TRANSFEMORAL N/A 06/30/2020   Procedure: TRANSCATHETER AORTIC VALVE REPLACEMENT, TRANSFEMORAL;  Surgeon: Burnell Blanks, MD;  Location: [place] [facility];  Service: Open Heart Surgery;  Laterality: N/A;   TUBAL LIGATION     WRIST SURGERY      Current Medications: Current  Meds  Medication Sig   albuterol (VENTOLIN HFA) 108 (90 Base) MCG/ACT inhaler Inhale 2 puffs into the lungs every 4 (four) hours as needed for wheezing or shortness of breath.   apixaban (ELIQUIS) 5 MG TABS tablet Take 1 tablet (5 mg total) by mouth 2 (two) times daily.   aspirin 81 MG chewable tablet Chew 1 tablet (81 mg total) by mouth daily.   cetirizine (ZYRTEC) 10 MG tablet Take 10 mg by mouth daily as needed for allergies.   diclofenac Sodium (VOLTAREN) 1 % GEL Apply 1 application topically 2 (two) times daily as needed (pain).   diltiazem (CARDIZEM CD) 180 MG 24 hr capsule Take 1 capsule (180 mg total) by mouth in the morning and at bedtime.   metFORMIN (GLUCOPHAGE-XR) 750 MG 24 hr tablet Take  750 mg by mouth daily in the afternoon.   metoprolol tartrate (LOPRESSOR) 50 MG tablet Take 1 tablet (50 mg total) by mouth 2 (two) times daily.   Multiple Vitamins-Minerals (MULTIVITAMIN WITH MINERALS) tablet Take 1 tablet by mouth daily.   Naphazoline-Pheniramine (ALLERGY EYE OP) Place 1 drop into both eyes daily as needed (red/itchy eyes).   pantoprazole (PROTONIX) 40 MG tablet Take 40 mg by mouth daily as needed (heartburn).   rOPINIRole (REQUIP) 4 MG tablet Take 4 mg by mouth at bedtime.   rosuvastatin (CRESTOR) 20 MG tablet Take 20 mg by mouth daily in the afternoon.   vitamin C (ASCORBIC ACID) 500 MG tablet Take 500 mg by mouth daily.     Allergies:   Bee venom, Apricot flavor, Atorvastatin, and Wasp venom   Social History   Socioeconomic History   Marital status: Widowed    Spouse name: Not on file   Number of children: 2   Years of education: Not on file   Highest education level: Not on file  Occupational History   Occupation: Retired-worked at [employer]   Smoking status: Never   Smokeless tobacco: Never  Vaping Use   Vaping Use: Never used  Substance and Sexual Activity   Alcohol use: No   Drug use: No   Sexual activity: Not Currently  Other Topics Concern    Not on file  Social History Narrative   Not on file   Social Determinants of Health   Financial Resource Strain: Not on file  Food Insecurity: Not on file  Transportation Needs: Not on file  Physical Activity: Not on file  Stress: Not on file  Social Connections: Not on file     Family History: The patient's family history includes Cancer in her father; Heart disease in her mother; Stroke in her mother.  ROS:   Please see the history of present illness.    All other systems reviewed and are negative.  EKGs/Labs/Other Studies Reviewed:    The following studies were reviewed today:  TAVR OPERATIVE NOTE     Date of Procedure:                06/30/2020   Preoperative Diagnosis:      Severe Aortic Stenosis    Postoperative Diagnosis:    Same    Procedure:        Transcatheter Aortic Valve Replacement - Percutaneous Right Transfemoral Approach             Edwards Sapien 3 Ultra THV (size 23 mm, model # 9750TFX, serial # [id number])              Co-Surgeons:            Lauree Chandler, MD and Valentina Gu. Roxy Manns, MD   Anesthesiologist:                  Duane Boston, MD   Echocardiographer:              Eleonore Chiquito, MD   Pre-operative Echo Findings: Paradoxical severe low flow, low gradient aortic stenosis Normal left ventricular systolic function   Post-operative Echo Findings: No paravalvular leak Unchanged left ventricular systolic function   _____________   Echo 07/01/20: IMPRESSIONS   1. 23 mm S3 in the aortic position (06/30/2020). V max 2.2 m/s, MG 11  mmHG, EOA 2.2 cm2, DI 0.65. No regurgitation or paravalvular leak  detected. The aortic valve has been repaired/replaced. Aortic valve  regurgitation is  not visualized. There is a 23 mm  Sapien prosthetic (TAVR) valve present in the aortic position. Procedure  Date: 06/30/2020. Echo findings are consistent with normal structure and  function of the aortic valve prosthesis.   2. Afib with RVR noted during  the study.   3. Left ventricular ejection fraction, by estimation, is 55 to 60%. The  left ventricle has normal function. The left ventricle has no regional  wall motion abnormalities.   4. Right ventricular systolic function is normal. The right ventricular  size is normal. There is normal pulmonary artery systolic pressure. The  estimated right ventricular systolic pressure is 65.0 mmHg.   5. Left atrial size was moderately dilated.   6. The mitral valve is degenerative. Mild mitral valve regurgitation.  Moderate to severe mitral annular calcification.   7. The inferior vena cava is normal in size with greater than 50%  respiratory variability, suggesting right atrial pressure of 3 mmHg.   _____________________   EKG:  EKG is NOT ordered today.     Recent Labs: 06/21/2020: B Natriuretic Peptide 250.5 06/26/2020: ALT 25 07/01/2020: BUN 18; Creatinine, Ser 0.87; Hemoglobin 13.4; Magnesium 2.0; Platelets 234; Potassium 4.9; Sodium 137  Recent Lipid Panel No results found for: CHOL, TRIG, HDL, CHOLHDL, VLDL, LDLCALC, LDLDIRECT   Risk Assessment/Calculations:    CHA2DS2-VASc Score = 6  This indicates a 9.7% annual risk of stroke. The patient's score is based upon: CHF History: Yes HTN History: Yes Diabetes History: Yes Stroke History: No Vascular Disease History: Yes Age Score: 1 Gender Score: 1      Physical Exam:    VS:  BP 122/80   Pulse 93   Ht _0  (1.702 m)   Wt 209 lb (94.8 kg)   SpO2 96%   BMI 32.73 kg/m     Wt Readings from Last 3 Encounters:  07/30/20 209 lb (94.8 kg)  07/08/20 209 lb (94.8 kg)  07/01/20 206 lb 8 oz (93.7 kg)     GEN:  Well nourished, well developed in no acute distress HEENT: Normal NECK: No JVD;  LYMPHATICS: No lymphadenopathy CARDIAC: irreg irreg, no murmurs, rubs, gallops RESPIRATORY:  Clear to auscultation without rales, wheezing or rhonchi  ABDOMEN: Soft, non-tender, non-distended MUSCULOSKELETAL:  No edema; No deformity   SKIN: Warm and dry. NEUROLOGIC:  Alert and oriented x 3 PSYCHIATRIC:  Normal affect   ASSESSMENT:    1. S/P TAVR (transcatheter aortic valve replacement)   2. Permanent atrial fibrillation ([place])   3. Essential hypertension   4. Stenosis of right carotid artery   5. AVM (arteriovenous malformation)     PLAN:    In order of problems listed above:  Severe AS s/p TAVR: unfortnately she missed her echo today. This has been r/s for 7/21. She has NYHA class II symptoms but a huge improvement since TAVR. SBE prophylaxis discussed; the patient is edentulous and does not go to the dentist. Continue on Eliquis and aspirin. Can stop aspirin on 11/30.  I will see her back for 1 year follow up and echo. Will follow up on her echo scheduled for later this month.    Chronic AFib: Continue CCB and BB as well as Eliquis.    HTN: BP well controlled. No changes made    Carotid artery disease: pre TAVR dopplers showed 3-54% LICA stenosis and 65-68% RICA stenosis. Continue medical therapy.    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 This was noted on pre TAVR CT. Not sure there is anything to do about this.    Medication Adjustments/Labs and Tests Ordered: Current medicines are reviewed at length with the patient today.  Concerns regarding medicines are outlined above.  No orders of the defined types were placed in this encounter.  No orders of the defined types were placed in this encounter.   Patient Instructions  Medication Instructions:  Your physician has recommended you make the following change in your medication:  ON 12/30/2020 you can stop your Aspirin  *If you need a refill on your cardiac medications before your next appointment, please call your pharmacy*   Lab Work: None ordered   Testing/Procedures: Echocardiogram on 08/20/2020   Follow-Up: At [facility],  you and your health needs are our priority.  As part of our continuing mission to provide you with exceptional heart care, we have created designated Provider Care Teams.  These Care Teams include your primary Cardiologist (physician) and Advanced Practice Providers (APPs -  Physician Assistants and Nurse Practitioners) who all work together to provide you with the care you need, when you need it.   Your next appointment:   6 month(s)  The format for your next appointment:   In Person  Provider:   Daneen Schick, MD    Thank you for choosing [facility]!!       Signed, Angelena Form, PA-C  07/30/2020 2:53 PM    [place]

## 2020-07-31 ENCOUNTER — Other Ambulatory Visit: Payer: Self-pay | Admitting: Physician Assistant

## 2020-07-31 DIAGNOSIS — Z20822 Contact with and (suspected) exposure to covid-19: Secondary | ICD-10-CM | POA: Diagnosis not present

## 2020-08-20 ENCOUNTER — Ambulatory Visit: Payer: Medicare Other | Admitting: Physician Assistant

## 2020-08-20 ENCOUNTER — Other Ambulatory Visit (HOSPITAL_COMMUNITY): Payer: Medicare Other

## 2020-08-24 ENCOUNTER — Telehealth (HOSPITAL_COMMUNITY): Payer: Self-pay | Admitting: Physician Assistant

## 2020-08-24 ENCOUNTER — Other Ambulatory Visit: Payer: Self-pay | Admitting: Physician Assistant

## 2020-08-24 DIAGNOSIS — Z952 Presence of prosthetic heart valve: Secondary | ICD-10-CM

## 2020-08-24 NOTE — Telephone Encounter (Signed)
Just an FYI. We have made several attempts to contact this patient including sending a letter to schedule or reschedule their echocardiogram. We will be removing the patient from the echo WQ.  07/30/20 NO SHOWED -MAILED LETTER [facility]      Thank you

## 2020-09-10 ENCOUNTER — Other Ambulatory Visit: Payer: Self-pay

## 2020-09-10 ENCOUNTER — Ambulatory Visit (HOSPITAL_COMMUNITY): Payer: Medicare Other | Attending: Cardiology

## 2020-09-10 DIAGNOSIS — Z952 Presence of prosthetic heart valve: Secondary | ICD-10-CM | POA: Insufficient documentation

## 2020-09-11 LAB — ECHOCARDIOGRAM COMPLETE
AR max vel: 1.78 cm2
AV Area VTI: 1.81 cm2
AV Area mean vel: 1.79 cm2
AV Mean grad: 8.4 mmHg
AV Peak grad: 15.4 mmHg
Ao pk vel: 1.96 m/s
S' Lateral: 3.1 cm

## 2020-09-14 ENCOUNTER — Telehealth: Payer: Self-pay | Admitting: Physician Assistant

## 2020-09-14 NOTE — Telephone Encounter (Signed)
[name], PA-C  09/11/2020 11:25 AM EDT     1 month echo s/p TAVR looks good. EF 55%, normally functioning TAVR with a mean gradient of 8.4 mmHg and no PVL. Mild MR/MS. Moderate MAC. Noted to be in afib with RVR. Can you have her keep an eye on her HR and make sure she is taking her Cardizem and Lopressor. If she is complaint with these and HR remains elevated we will need to increase Her Lopressor to 117m BID.

## 2020-09-14 NOTE — Telephone Encounter (Signed)
Called and reviewed echo results with pt who states understanding.  She has not been regularly checking her VS at home but does have a monitor and will start.  She has a diary to keep results in and is aware to notify the office if HR is elevated.  She reports she has been taking her medications as ordered but does need to contact the pharmacy about having them refilled as she only has a few left of several of them.

## 2020-09-14 NOTE — Telephone Encounter (Signed)
Follow Up:     Patient is returning a call, concerning her Echo results. 

## 2020-09-14 NOTE — Progress Notes (Unsigned)
Called and reviewed echo results with pt who states understanding.  She has not been regularly checking her VS at home but does have a monitor and will start.  She has a diary to keep results in and is aware to notify the office if HR is elevated.  She reports she has been taking her medications as ordered but does need to contact the pharmacy about having them refilled as she only has a few left of several of them.   

## 2020-09-15 ENCOUNTER — Telehealth: Payer: Self-pay | Admitting: Physician Assistant

## 2020-09-15 MED ORDER — METOPROLOL TARTRATE 50 MG PO TABS: 50.0000 mg | ORAL_TABLET | Freq: Two times a day (BID) | ORAL | 1 refills | Status: DC

## 2020-09-15 MED ORDER — ROSUVASTATIN CALCIUM 20 MG PO TABS: 20.0000 mg | ORAL_TABLET | Freq: Every day | ORAL | 1 refills | Status: DC

## 2020-09-15 NOTE — Telephone Encounter (Signed)
*  STAT* If patient is at the pharmacy, call can be transferred to refill team.   1. Which medications need to be refilled? (please list name of each medication and dose if known) need new prescriptions for Metoprolol and Rosuvastatin  2. Which pharmacy/location (including street and city if local pharmacy) is medication to be sent to? [facility] [address], [place]  3. Do they need a 30 day or 90 day supply? Metoprolol# 60 and Rosuvastatin # 83

## 2020-09-15 NOTE — Telephone Encounter (Signed)
Pt scheduled to see Dr. Katrinka Blazing in December, refills for Rosuvastatin and [facility]rol[facility]

## 2020-10-27 ENCOUNTER — Emergency Department (HOSPITAL_COMMUNITY): Payer: Medicare Other

## 2020-10-27 ENCOUNTER — Inpatient Hospital Stay (HOSPITAL_COMMUNITY)
Admission: EM | Admit: 2020-10-27 | Discharge: 2020-10-29 | DRG: 243 | Disposition: A | Discharge status: Home / Self Care | Payer: Medicare Other | Attending: Cardiology | Admitting: Cardiology

## 2020-10-27 ENCOUNTER — Encounter (HOSPITAL_COMMUNITY): Payer: Self-pay

## 2020-10-27 ENCOUNTER — Other Ambulatory Visit: Payer: Self-pay

## 2020-10-27 DIAGNOSIS — Z91018 Allergy to other foods: Secondary | ICD-10-CM

## 2020-10-27 DIAGNOSIS — E119 Type 2 diabetes mellitus without complications: Secondary | ICD-10-CM | POA: Diagnosis present

## 2020-10-27 DIAGNOSIS — Z7901 Long term (current) use of anticoagulants: Secondary | ICD-10-CM

## 2020-10-27 DIAGNOSIS — M19041 Primary osteoarthritis, right hand: Secondary | ICD-10-CM | POA: Diagnosis not present

## 2020-10-27 DIAGNOSIS — G2581 Restless legs syndrome: Secondary | ICD-10-CM | POA: Diagnosis present

## 2020-10-27 DIAGNOSIS — R6889 Other general symptoms and signs: Secondary | ICD-10-CM | POA: Diagnosis not present

## 2020-10-27 DIAGNOSIS — E785 Hyperlipidemia, unspecified: Secondary | ICD-10-CM | POA: Diagnosis present

## 2020-10-27 DIAGNOSIS — N179 Acute kidney failure, unspecified: Secondary | ICD-10-CM | POA: Diagnosis not present

## 2020-10-27 DIAGNOSIS — J984 Other disorders of lung: Secondary | ICD-10-CM | POA: Diagnosis not present

## 2020-10-27 DIAGNOSIS — I4821 Permanent atrial fibrillation: Secondary | ICD-10-CM | POA: Diagnosis present

## 2020-10-27 DIAGNOSIS — M19042 Primary osteoarthritis, left hand: Secondary | ICD-10-CM | POA: Diagnosis not present

## 2020-10-27 DIAGNOSIS — Z95818 Presence of other cardiac implants and grafts: Secondary | ICD-10-CM

## 2020-10-27 DIAGNOSIS — D72829 Elevated white blood cell count, unspecified: Secondary | ICD-10-CM | POA: Diagnosis not present

## 2020-10-27 DIAGNOSIS — R001 Bradycardia, unspecified: Secondary | ICD-10-CM

## 2020-10-27 DIAGNOSIS — Z91030 Bee allergy status: Secondary | ICD-10-CM

## 2020-10-27 DIAGNOSIS — I447 Left bundle-branch block, unspecified: Secondary | ICD-10-CM | POA: Diagnosis not present

## 2020-10-27 DIAGNOSIS — K219 Gastro-esophageal reflux disease without esophagitis: Secondary | ICD-10-CM | POA: Diagnosis not present

## 2020-10-27 DIAGNOSIS — I4891 Unspecified atrial fibrillation: Secondary | ICD-10-CM

## 2020-10-27 DIAGNOSIS — Z7984 Long term (current) use of oral hypoglycemic drugs: Secondary | ICD-10-CM | POA: Diagnosis not present

## 2020-10-27 DIAGNOSIS — R55 Syncope and collapse: Secondary | ICD-10-CM | POA: Diagnosis present

## 2020-10-27 DIAGNOSIS — Z79899 Other long term (current) drug therapy: Secondary | ICD-10-CM

## 2020-10-27 DIAGNOSIS — R0902 Hypoxemia: Secondary | ICD-10-CM | POA: Diagnosis not present

## 2020-10-27 DIAGNOSIS — I10 Essential (primary) hypertension: Secondary | ICD-10-CM | POA: Diagnosis present

## 2020-10-27 DIAGNOSIS — Z20822 Contact with and (suspected) exposure to covid-19: Secondary | ICD-10-CM | POA: Diagnosis not present

## 2020-10-27 DIAGNOSIS — M069 Rheumatoid arthritis, unspecified: Secondary | ICD-10-CM | POA: Diagnosis not present

## 2020-10-27 DIAGNOSIS — J45909 Unspecified asthma, uncomplicated: Secondary | ICD-10-CM | POA: Diagnosis present

## 2020-10-27 DIAGNOSIS — Z952 Presence of prosthetic heart valve: Secondary | ICD-10-CM

## 2020-10-27 DIAGNOSIS — Z888 Allergy status to other drugs, medicaments and biological substances status: Secondary | ICD-10-CM

## 2020-10-27 DIAGNOSIS — Z7982 Long term (current) use of aspirin: Secondary | ICD-10-CM

## 2020-10-27 DIAGNOSIS — I495 Sick sinus syndrome: Principal | ICD-10-CM | POA: Diagnosis present

## 2020-10-27 DIAGNOSIS — E1165 Type 2 diabetes mellitus with hyperglycemia: Secondary | ICD-10-CM

## 2020-10-27 DIAGNOSIS — R42 Dizziness and giddiness: Secondary | ICD-10-CM | POA: Diagnosis not present

## 2020-10-27 DIAGNOSIS — R739 Hyperglycemia, unspecified: Secondary | ICD-10-CM | POA: Diagnosis not present

## 2020-10-27 DIAGNOSIS — I499 Cardiac arrhythmia, unspecified: Secondary | ICD-10-CM | POA: Diagnosis not present

## 2020-10-27 DIAGNOSIS — Z743 Need for continuous supervision: Secondary | ICD-10-CM | POA: Diagnosis not present

## 2020-10-27 DIAGNOSIS — Z8249 Family history of ischemic heart disease and other diseases of the circulatory system: Secondary | ICD-10-CM | POA: Diagnosis not present

## 2020-10-27 LAB — CBC WITH DIFFERENTIAL/PLATELET
Abs Immature Granulocytes: 0.09 K/uL — ABNORMAL HIGH (ref 0.00–0.07)
Basophils Absolute: 0.1 K/uL (ref 0.0–0.1)
Basophils Relative: 0 %
Eosinophils Absolute: 0 K/uL (ref 0.0–0.5)
Eosinophils Relative: 0 %
HCT: 38.7 % (ref 36.0–46.0)
Hemoglobin: 12.8 g/dL (ref 12.0–15.0)
Immature Granulocytes: 1 %
Lymphocytes Relative: 10 %
Lymphs Abs: 1.2 K/uL (ref 0.7–4.0)
MCH: 31.2 pg (ref 26.0–34.0)
MCHC: 33.1 g/dL (ref 30.0–36.0)
MCV: 94.4 fL (ref 80.0–100.0)
Monocytes Absolute: 0.9 K/uL (ref 0.1–1.0)
Monocytes Relative: 7 %
Neutro Abs: 10.1 K/uL — ABNORMAL HIGH (ref 1.7–7.7)
Neutrophils Relative %: 82 %
Platelets: 173 K/uL (ref 150–400)
RBC: 4.1 MIL/uL (ref 3.87–5.11)
RDW: 13.7 % (ref 11.5–15.5)
WBC: 12.3 K/uL — ABNORMAL HIGH (ref 4.0–10.5)
nRBC: 0 % (ref 0.0–0.2)

## 2020-10-27 LAB — BASIC METABOLIC PANEL
Anion gap: 9 (ref 5–15)
BUN: 19 mg/dL (ref 8–23)
CO2: 23 mmol/L (ref 22–32)
Calcium: 9.7 mg/dL (ref 8.9–10.3)
Chloride: 104 mmol/L (ref 98–111)
Creatinine, Ser: 1.64 mg/dL — ABNORMAL HIGH (ref 0.44–1.00)
GFR, Estimated: 33 mL/min — ABNORMAL LOW (ref >60)
Glucose, Bld: 278 mg/dL — ABNORMAL HIGH (ref 70–99)
Potassium: 4.5 mmol/L (ref 3.5–5.1)
Sodium: 136 mmol/L (ref 135–145)

## 2020-10-27 LAB — RESP PANEL BY RT-PCR (FLU A&B, COVID) ARPGX2
Influenza A by PCR: NEGATIVE
Influenza B by PCR: NEGATIVE
SARS Coronavirus 2 by RT PCR: NEGATIVE

## 2020-10-27 LAB — TROPONIN I (HIGH SENSITIVITY)
Troponin I (High Sensitivity): 4 ng/L
Troponin I (High Sensitivity): 19 ng/L — ABNORMAL HIGH (ref <18)

## 2020-10-27 LAB — CBG MONITORING, ED: Glucose-Capillary: 166 mg/dL — ABNORMAL HIGH (ref 70–99)

## 2020-10-27 MED ORDER — ROPINIROLE HCL 0.5 MG PO TABS
4.0000 mg | ORAL_TABLET | Freq: Every day | ORAL | Status: DC
  Administered 2020-10-27 – 2020-10-28 (×2): 4 mg via ORAL
  Filled 2020-10-27: qty 4
  Filled 2020-10-27: qty 8

## 2020-10-27 MED ORDER — INSULIN ASPART 100 UNIT/ML IJ SOLN
0.0000 [IU] | Freq: Three times a day (TID) | INTRAMUSCULAR | Status: DC
  Administered 2020-10-29: 2 [IU] via SUBCUTANEOUS

## 2020-10-27 MED ORDER — ALBUTEROL SULFATE (2.5 MG/3ML) 0.083% IN NEBU: 2.5000 mg | INHALATION_SOLUTION | RESPIRATORY_TRACT | Status: DC | PRN

## 2020-10-27 MED ORDER — ROSUVASTATIN CALCIUM 20 MG PO TABS
20.0000 mg | ORAL_TABLET | Freq: Every day | ORAL | Status: DC
  Administered 2020-10-27 – 2020-10-28 (×3): 20 mg via ORAL
  Filled 2020-10-27 (×3): qty 1

## 2020-10-27 MED ORDER — ADULT MULTIVITAMIN W/MINERALS CH
1.0000 | ORAL_TABLET | Freq: Every day | ORAL | Status: DC
  Administered 2020-10-29: 1 via ORAL
  Filled 2020-10-27: qty 1

## 2020-10-27 MED ORDER — ASCORBIC ACID 500 MG PO TABS
500.0000 mg | ORAL_TABLET | Freq: Every day | ORAL | Status: DC
  Administered 2020-10-29: 500 mg via ORAL
  Filled 2020-10-27: qty 1

## 2020-10-27 MED ORDER — INSULIN ASPART 100 UNIT/ML IJ SOLN: 0.0000 [IU] | Freq: Every day | INTRAMUSCULAR | Status: DC

## 2020-10-27 MED ORDER — CALCIUM CARBONATE ANTACID 500 MG PO CHEW
1.0000 | CHEWABLE_TABLET | Freq: Two times a day (BID) | ORAL | Status: DC | PRN
  Administered 2020-10-27: 200 mg via ORAL
  Filled 2020-10-27: qty 1

## 2020-10-27 MED ORDER — ACETAMINOPHEN 325 MG PO TABS
650.0000 mg | ORAL_TABLET | ORAL | Status: DC | PRN
  Administered 2020-10-28 – 2020-10-29 (×4): 650 mg via ORAL
  Filled 2020-10-27 (×4): qty 2

## 2020-10-27 MED ORDER — ASPIRIN 81 MG PO CHEW
81.0000 mg | CHEWABLE_TABLET | Freq: Every day | ORAL | Status: DC
  Administered 2020-10-29: 81 mg via ORAL
  Filled 2020-10-27: qty 1

## 2020-10-27 MED ORDER — ALBUTEROL SULFATE HFA 108 (90 BASE) MCG/ACT IN AERS: 2.0000 | INHALATION_SPRAY | RESPIRATORY_TRACT | Status: DC | PRN

## 2020-10-27 MED ORDER — APIXABAN 5 MG PO TABS
5.0000 mg | ORAL_TABLET | Freq: Two times a day (BID) | ORAL | Status: DC
  Administered 2020-10-27: 5 mg via ORAL
  Filled 2020-10-27: qty 1

## 2020-10-27 MED ORDER — LACTATED RINGERS IV BOLUS
500.0000 mL | Freq: Once | INTRAVENOUS | Status: AC
  Administered 2020-10-27: 500 mL via INTRAVENOUS

## 2020-10-27 MED ORDER — ONDANSETRON HCL 4 MG/2ML IJ SOLN: 4.0000 mg | Freq: Four times a day (QID) | INTRAMUSCULAR | Status: DC | PRN

## 2020-10-27 NOTE — ED Triage Notes (Signed)
Pt bib [facility] from home with complaints of dizziness and n/v that started today while sitting down. Upon ems arrival pt was found with HR 30-50 and a BP 56/32. Pt is no longer dizzy and just complains of feeling weak. AOx4

## 2020-10-27 NOTE — H&P (Addendum)
ADMISSION HISTORY & PHYSICAL  Patient Name: Briana Foster Date of Encounter: 10/27/2020 Primary Care Physician: Georgann Housekeeper, MD Cardiologist: Lesleigh Noe, MD  Chief Complaint   Near syncope  Patient Profile   75 yo female with permanent atrial fibrillation, severe aortic stenosis status post TAVR in 05/2020, presents with ongoing fatigue and dyspnea and a near syncopal event and found to be bradycardic and hypotensive.  HPI   This is a 75 y.o. female with a past medical history significant for permanent atrial fibrillation, type 2 diabetes, GERD, hypertension, carotid artery disease, severe aortic stenosis status post TAVR in May 2022, presenting now with progressive fatigue and dyspnea and an episode of near syncope while picking up her daughter from school.  She was then noted to be lightheaded and nauseated.  Symptoms worsened and then she vomited several times.  EMS was called as she was unresponsive for period of time to her daughter and was found to be hypotensive with blood pressure of 56/32 and heart rate in the 30s to 50s in A. fib on arrival.  She was given IV fluids with good response however remains bradycardic.  She is on metoprolol tartrate 50 mg twice daily and diltiazem CD 180 mg daily.  She denies any fever, chills or sweats, diarrhea, frank syncope, dysuria or sick contacts. COVID-19 test is pending.  She has been given more than 1 L of IV fluids and systolic blood pressure has come up to the low 100s.  She reports she feels less dizzy.  Labs remain pending.  Chest x-ray performed and appears to show no significant consolidation or edema (my read, official read pending).  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 Clifton James & Dr. Cornelius Moras   Severe aortic stenosis    Type 2 diabetes mellitus without complication, without long-term current use of insulin (HCC) 04/10/2018   Vitamin D deficiency     Past Surgical History:  Procedure Laterality Date   BUNIONECTOMY  20 yrs ago   bil feet   BUNIONECTOMY  11/30/2011   Procedure: Arbutus Leas;  Surgeon: Drucilla Schmidt, MD;  Location: [facility];  Service: Orthopedics;  Laterality: Bilateral;  RIGHT FOOT EXCISION OF BUNIONETTE AND PARTIAL   PROXIMAL PHALANGECTOMY OF 5TH TOE LEFT FOOT FUNK BUNIONECTOMY,EXCISION OF BUNIONETTE    CARDIOVERSION N/A 11/05/2019   Procedure: CARDIOVERSION;  Surgeon: Lewayne Bunting, MD;  Location: [facility] ENDOSCOPY;  Service: Cardiovascular;  Laterality: N/A;   CARDIOVERSION N/A 12/05/2019   Procedure: CARDIOVERSION;  Surgeon: Parke Poisson, MD;  Location: [facility] ENDOSCOPY;  Service: Cardiovascular;  Laterality: N/A;   COLONOSCOPY WITH PROPOFOL N/A 08/07/2012   Procedure: COLONOSCOPY WITH PROPOFOL;  Surgeon: Charolett Bumpers, MD;  Location: [facility] ENDOSCOPY;  Service: Endoscopy;  Laterality: N/A;   MOUTH SURGERY     teeth extractions   RIGHT/LEFT HEART CATH AND CORONARY ANGIOGRAPHY N/A 06/17/2020   Procedure: RIGHT/LEFT HEART CATH AND CORONARY ANGIOGRAPHY;  Surgeon: Lyn Records, MD;  Location: [facility] INVASIVE CV LAB;  Service: Cardiovascular;  Laterality: N/A;   TONSILLECTOMY     TRANSCATHETER AORTIC VALVE REPLACEMENT, TRANSFEMORAL N/A 06/30/2020   Procedure: TRANSCATHETER AORTIC VALVE REPLACEMENT, TRANSFEMORAL;  Surgeon: Kathleene Hazel, MD;  Location: [facility] INVASIVE CV LAB;  Service: Open Heart  Surgery;  Laterality: N/A;   TUBAL LIGATION     WRIST SURGERY      FAMHx   Family History  Problem Relation Age of Onset   Heart disease Mother    Stroke Mother    Cancer Father        Prostate    SOCHx    reports that she has never smoked. She has never used smokeless tobacco. She reports that she does not drink alcohol and does not use drugs.  Outpatient Medications   No current facility-administered medications on file prior to encounter.   Current Outpatient Medications on File Prior to Encounter  Medication Sig Dispense Refill   albuterol (VENTOLIN HFA) 108 (90 Base) MCG/ACT inhaler Inhale 2 puffs into the lungs every 4 (four) hours as needed for wheezing or shortness of breath.     apixaban (ELIQUIS) 5 MG TABS tablet Take 1 tablet (5 mg total) by mouth 2 (two) times daily.     aspirin 81 MG chewable tablet Chew 1 tablet (81 mg total) by mouth daily. 90 tablet 1   cetirizine (ZYRTEC) 10 MG tablet Take 10 mg by mouth daily as needed for allergies.     diclofenac Sodium (VOLTAREN) 1 % GEL Apply 1 application topically 2 (two) times daily as needed (pain).     diltiazem (CARDIZEM CD) 180 MG 24 hr capsule Take 1 capsule (180 mg total) by mouth in the morning and at bedtime. 180 capsule 3   metFORMIN (GLUCOPHAGE-XR) 750 MG 24 hr tablet Take 750 mg by mouth daily in the afternoon.     metoprolol tartrate (LOPRESSOR) 50 MG tablet Take 1 tablet (50 mg total) by mouth 2 (two) times daily. 180 tablet 1   Multiple Vitamins-Minerals (MULTIVITAMIN WITH MINERALS) tablet Take 1 tablet by mouth daily.     Naphazoline-Pheniramine (ALLERGY EYE OP) Place 1 drop into both eyes daily as needed (red/itchy eyes).     pantoprazole (PROTONIX) 40 MG tablet Take 40 mg by mouth daily as needed (heartburn).     rOPINIRole (REQUIP) 4 MG tablet Take 4 mg by mouth at bedtime.     rosuvastatin (CRESTOR) 20 MG tablet Take 1 tablet (20 mg total) by mouth daily in the afternoon. 90 tablet 1   vitamin C  (ASCORBIC ACID) 500 MG tablet Take 500 mg by mouth daily.      Inpatient Medications    Scheduled Meds:   Continuous Infusions:   PRN Meds:    ALLERGIES   Allergies  Allergen Reactions   Bee Venom Shortness Of Breath and Rash   Apricot Flavor Swelling    Eyes swell shut   Atorvastatin Itching    Eye swelling   Wasp Venom Other (See Comments)    ROS   Pertinent items noted in HPI and remainder of comprehensive ROS otherwise negative.  Vitals   Vitals:   10/27/20 1811 10/27/20 1900  BP: (!) 83/72 111/86  Pulse: (!) 47 (!) 42  Resp: 20 17  Temp: 97.7 F (36.5 C)   TempSrc: Oral   SpO2: 98% 93%  Weight: 86.2 kg   Height: 5\' 8"  (1.727 m)     Intake/Output Summary (Last 24 hours) at 10/27/2020 1928 Last data filed at 10/27/2020 1910 Gross per 24 hour  Intake 500 ml  Output --  Net 500 ml   Filed Weights   10/27/20 1811  Weight: 86.2 kg    Physical Exam   General appearance: alert, no distress, and pale Neck: no carotid bruit, no JVD, and thyroid not enlarged, symmetric, no tenderness/mass/nodules Lungs: clear to auscultation bilaterally Heart: regular rate and rhythm, S1, S2 normal, and systolic murmur: early systolic 2/6, crescendo at 2nd right intercostal space Abdomen: soft, non-tender; bowel sounds normal; no masses,  no organomegaly Extremities: extremities normal, atraumatic, no cyanosis or edema Pulses: 2+ and symmetric Skin: Pale, cool, dry Neurologic: Alert and oriented X 3, normal strength and tone. Normal symmetric reflexes. Normal coordination and gait Psych: Pleasant  Labs   No results found for this or any previous visit (from the past 48 hour(s)).  ECG   A. fib with left bundle branch block and PACs at 49- Personally Reviewed  Telemetry   A. fib with slow ventricular response- Personally Reviewed  Radiology   DG Chest Port 1 View  Result Date: 10/27/2020 CLINICAL DATA:  Dizziness with bradycardia EXAM: PORTABLE CHEST 1 VIEW  COMPARISON:  06/26/2020 FINDINGS: No focal opacity or pleural effusion. Cardiac size upper limits of normal. No pneumothorax. Interval TAVR. IMPRESSION: No active disease. Electronically Signed   By: 06/28/2020 M.D.   On: 10/27/2020 19:13    Cardiac Studies   None  Assessment   Principal Problem:   Near syncope Active Problems:   Essential hypertension   Type 2 diabetes mellitus without complication, without long-term current use of insulin (HCC)   Restless leg syndrome   S/P TAVR (transcatheter aortic valve replacement)   Atrial fibrillation with slow ventricular response (HCC)   LBBB (left bundle branch block)   Plan   Near syncope -Noted to be markedly hypotensive and bradycardic on EMS arrival, this is responded somewhat to fluids.  We will obviously hold her beta-blocker and calcium channel blocker to allow washout however she has had A. fib with RVR in the past and given her aortic annular calcification status post TAVR is at risk for conduction abnormalities.  A left bundle branch block pattern is noted however this was previously noted as well. 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 A. fib with slow ventricular response -She is followed by Dr. 10/29/2020.  She has been seen most recently by Dr. Lalla Brothers in May 2022, noting she has failed to maintain sinus rhythm on amiodarone.  There was discussion in the past about the need for AV nodal ablation and pacing prior to TAVR however it was recommended to continue with the procedure and she seemed to tolerate it well.  We will hold her beta-blocker and calcium channel blocker.  She has external pacing pads available.  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 Orthostatic hypotension -May be related to combination of medications and profound bradycardia or  secondary to other etiologies such as infection.  Labs are pending.  Hold beta-blocker and calcium channel blocker.  She is receiving IV fluids. History of TAVR (05/2020)  -This appears to  be stable.  Her last echo was in August 2022 showing  EF 55%, normally functioning TAVR valve with a mean gradient of 8  mmHg. Essential hypertension -Hypotensive on admission, now receiving lactated Ringer's.  May be related to bradycardia.  Labs are pending, low threshold to evaluate for possible sepsis.  Given that she is post TAVR with normal LV function, would be okay with continued fluids.  Holding diltiazem and metoprolol.  Monitor blood pressure and consider treatment as needed, possibly with short acting hydralazine. DM2  -Hold metformin and will use moderate sliding scale insulin, check A1c RLS -Hold ropinirole due to bradycardia and hypotension  Full code  Time Spent Directly with Patient:  I have spent a total of 55 minutes with patient reviewing hospital notes, telemetry, EKGs, labs and examining the patient as well as establishing an assessment and plan that was discussed with the patient.  > 50% of time was spent in direct patient care.   Length of Stay:  LOS: 0 days   Chrystie Nose, MD, [facility], FACP  [place]  [facility]  Medical Director of the [facility]  [facility] Diplomate of the American Board of Clinical Lipidology Attending Cardiologist  Direct Dial: [phone]  Fax: [phone]  Website:  [email or web address] Nicely Nyko Gell 10/27/2020, 7:28 PM

## 2020-10-27 NOTE — ED Provider Notes (Signed)
[place] EMERGENCY DEPARTMENT Provider Note  CSN: [id number] Arrival date & time: 10/27/20 1800    History Chief Complaint  Patient presents with   Dizziness    Briana Foster is a 75 y.o. female with history of permanent afib on Eliquis, metoprolol and Cardizem CD aldo had TAVR in May, reports around 1pm today while in line to pick up her granddaughter from school she began to feel lightheaded and swimmy headed, this was associated with nausea. She had to stop and throw up several times on the way home. She thought it might be due to vertigo which she has had before. When her symptoms did not improve over the next few hours she called EMS who found her to be bradycardic and hypotensive. BP improved enroute but remains bradycardic. She denies any CP, mild SOB is not unusual for her. She does not think she took any extra of her medications.    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 Clifton James & Dr. Cornelius Moras   Severe aortic stenosis    Type 2 diabetes mellitus without complication, without long-term current use of insulin (HCC) 04/10/2018   Vitamin D deficiency     Past Surgical History:  Procedure Laterality Date   BUNIONECTOMY  20 yrs ago   bil feet   BUNIONECTOMY  11/30/2011   Procedure: Arbutus Leas;  Surgeon: Drucilla Schmidt, MD;  Location: [facility];  Service: Orthopedics;  Laterality: Bilateral;   RIGHT FOOT EXCISION OF BUNIONETTE AND PARTIAL   PROXIMAL PHALANGECTOMY OF 5TH TOE LEFT FOOT FUNK BUNIONECTOMY,EXCISION OF BUNIONETTE    CARDIOVERSION N/A 11/05/2019   Procedure: CARDIOVERSION;  Surgeon: Lewayne Bunting, MD;  Location: [facility] ENDOSCOPY;  Service: Cardiovascular;  Laterality: N/A;   CARDIOVERSION N/A 12/05/2019   Procedure: CARDIOVERSION;  Surgeon: Parke Poisson, MD;  Location: [facility] ENDOSCOPY;  Service: Cardiovascular;  Laterality: N/A;   COLONOSCOPY WITH PROPOFOL N/A 08/07/2012   Procedure: COLONOSCOPY WITH PROPOFOL;  Surgeon: Charolett Bumpers, MD;  Location: [facility] ENDOSCOPY;  Service: Endoscopy;  Laterality: N/A;   MOUTH SURGERY     teeth extractions   RIGHT/LEFT HEART CATH AND CORONARY ANGIOGRAPHY N/A 06/17/2020   Procedure: RIGHT/LEFT HEART CATH AND CORONARY ANGIOGRAPHY;  Surgeon: Lyn Records, MD;  Location: [facility] INVASIVE CV LAB;  Service: Cardiovascular;  Laterality: N/A;   TONSILLECTOMY     TRANSCATHETER AORTIC VALVE REPLACEMENT, TRANSFEMORAL N/A 06/30/2020   Procedure: TRANSCATHETER AORTIC VALVE REPLACEMENT, TRANSFEMORAL;  Surgeon: Kathleene Hazel, MD;  Location: [facility] INVASIVE CV LAB;  Service: Open Heart Surgery;  Laterality: N/A;   TUBAL LIGATION     WRIST SURGERY      Family History  Problem Relation Age of Onset   Heart disease Mother    Stroke Mother    Cancer Father  Prostate    Social History   Tobacco Use   Smoking status: Never   Smokeless tobacco: Never  Vaping Use   Vaping Use: Never used  Substance Use Topics   Alcohol use: No   Drug use: No     Home Medications Prior to Admission medications   Medication Sig Start Date End Date Taking? Authorizing Provider  albuterol (VENTOLIN HFA) 108 (90 Base) MCG/ACT inhaler Inhale 2 puffs into the lungs every 4 (four) hours as needed for wheezing or shortness of breath.   Yes [provider]  apixaban (ELIQUIS) 5 MG TABS tablet Take 1 tablet (5 mg total) by mouth 2 (two) times daily.  07/01/20  Yes Janetta Hora, PA-C  aspirin 81 MG chewable tablet Chew 1 tablet (81 mg total) by mouth daily. 07/01/20  Yes Janetta Hora, PA-C  cetirizine (ZYRTEC) 10 MG tablet Take 10 mg by mouth daily as needed for allergies.   Yes [provider]  diclofenac Sodium (VOLTAREN) 1 % GEL Apply 1 application topically 2 (two) times daily as needed (pain). 02/21/20  Yes [provider]  diltiazem (CARDIZEM CD) 180 MG 24 hr capsule Take 1 capsule (180 mg total) by mouth in the morning and at bedtime. 06/10/20  Yes Kathleene Hazel, MD  metoprolol tartrate (LOPRESSOR) 50 MG tablet Take 1 tablet (50 mg total) by mouth 2 (two) times daily. 09/15/20  Yes Lyn Records, MD  Multiple Vitamins-Minerals (MULTIVITAMIN WITH MINERALS) tablet Take 1 tablet by mouth daily.   Yes [provider]  Naphazoline-Pheniramine (ALLERGY EYE OP) Place 1 drop into both eyes daily as needed (red/itchy eyes).   Yes [provider]  rOPINIRole (REQUIP) 4 MG tablet Take 4 mg by mouth at bedtime.   Yes [provider]  rosuvastatin (CRESTOR) 20 MG tablet Take 1 tablet (20 mg total) by mouth daily in the afternoon. 09/15/20  Yes Lyn Records, MD     Allergies    Bee venom, Apricot flavor, Atorvastatin, and Wasp venom   Review of Systems   Review of Systems A comprehensive review of systems was completed and negative except as noted in HPI.    Physical Exam BP 111/86   Pulse (!) 42   Temp 97.7 F (36.5 C) (Oral)   Resp 17   Ht 5\' 8"  (1.727 m)   Wt 86.2 kg   SpO2 93%   BMI 28.89 kg/m   Physical Exam Vitals and nursing note reviewed.  Constitutional:      Appearance: Normal appearance.  HENT:     Head: Normocephalic and atraumatic.     Nose: Nose normal.     Mouth/Throat:     Mouth: Mucous membranes are moist.  Eyes:     Extraocular Movements: Extraocular movements intact.     Conjunctiva/sclera: Conjunctivae normal.  Cardiovascular:     Rate and  Rhythm: Bradycardia present. Rhythm irregular.  Pulmonary:     Effort: Pulmonary effort is normal.     Breath sounds: Normal breath sounds.  Abdominal:     General: Abdomen is flat.     Palpations: Abdomen is soft.     Tenderness: There is no abdominal tenderness.  Musculoskeletal:        General: No swelling. Normal range of motion.     Cervical back: Neck supple.  Skin:    General: Skin is warm and dry.  Neurological:     General: No focal deficit present.     Mental Status:  She is alert.  Psychiatric:        Mood and Affect: Mood normal.     ED Results / Procedures / Treatments   Labs (all labs ordered are listed, but only abnormal results are displayed) Labs Reviewed  BASIC METABOLIC PANEL - Abnormal; Notable for the following components:      Result Value   Glucose, Bld 278 (*)    Creatinine, Ser 1.64 (*)    GFR, Estimated 33 (*)    All other components within normal limits  CBC WITH DIFFERENTIAL/PLATELET - Abnormal; Notable for the following components:   WBC 12.3 (*)    Neutro Abs 10.1 (*)    Abs Immature Granulocytes 0.09 (*)    All other components within normal limits  RESP PANEL BY RT-PCR (FLU A&B, COVID) ARPGX2  HEMOGLOBIN A1C  BASIC METABOLIC PANEL  LIPID PANEL  CBC  TROPONIN I (HIGH SENSITIVITY)  TROPONIN I (HIGH SENSITIVITY)    EKG EKG Interpretation  Date/Time:  Tuesday October 27 2020 18:06:59 EDT Ventricular Rate:  49 PR Interval:    QRS Duration: 134 QT Interval:  566 QTC Calculation: 426 R Axis:   -12 Text Interpretation: Atrial fibrillation Left bundle branch block Since last tracing LBBB is new and rate is slower Confirmed by Susy Frizzle [phone]) on 10/27/2020 6:09:57 PM  Radiology DG Chest Port 1 View  Result Date: 10/27/2020 CLINICAL DATA:  Dizziness with bradycardia EXAM: PORTABLE CHEST 1 VIEW COMPARISON:  06/26/2020 FINDINGS: No focal opacity or pleural effusion. Cardiac size upper limits of normal. No pneumothorax. Interval  TAVR. IMPRESSION: No active disease. Electronically Signed   By: Jasmine Pang M.D.   On: 10/27/2020 19:13    Procedures .Critical Care Performed by: Pollyann Savoy, MD Authorized by: Pollyann Savoy, MD   Critical care provider statement:    Critical care time (minutes):  40   Critical care time was exclusive of:  Separately billable procedures and treating other patients   Critical care was necessary to treat or prevent imminent or life-threatening deterioration of the following conditions:  Cardiac failure   Critical care was time spent personally by me on the following activities:  Discussions with consultants, evaluation of patient's response to treatment, examination of patient, ordering and performing treatments and interventions, ordering and review of laboratory studies, ordering and review of radiographic studies, pulse oximetry, re-evaluation of patient's condition, obtaining history from patient or surrogate and review of old charts   Care discussed with: admitting provider    Medications Ordered in the ED Medications  aspirin chewable tablet 81 mg (has no administration in time range)  rosuvastatin (CRESTOR) tablet 20 mg (20 mg Oral Given 10/27/20 1933)  apixaban (ELIQUIS) tablet 5 mg (has no administration in time range)  multivitamin with minerals tablet 1 tablet (has no administration in time range)  ascorbic acid (VITAMIN C) tablet 500 mg (has no administration in time range)  acetaminophen (TYLENOL) tablet 650 mg (has no administration in time range)  ondansetron (ZOFRAN) injection 4 mg (has no administration in time range)  insulin aspart (novoLOG) injection 0-15 Units (has no administration in time range)  insulin aspart (novoLOG) injection 0-5 Units (has no administration in time range)  albuterol (PROVENTIL) (2.5 MG/3ML) 0.083% nebulizer solution 2.5 mg (has no administration in time range)  lactated ringers bolus 500 mL (0 mLs Intravenous Stopped 10/27/20 1910)      MDM Rules/Calculators/A&P MDM HR variable between 30-55, BP improving, she does not appear in distress. She is awake  and alert. Denies nausea or dizziness now. Will check labs, give IVF, discuss with Cardiology.  ED Course  I have reviewed the triage vital signs and the nursing notes.  Pertinent labs & imaging results that were available during my care of the patient were reviewed by me and considered in my medical decision making (see chart for details).  Clinical Course as of 10/27/20 2046  Tue Oct 27, 2020  1844 Spoke with Dr. Rennis Golden, Cardiology who will come evaluate the patient. He does not recommend any acute intervention as her HR and BP seem to be improving.  [CS]  1917 CXR is clear [CS]  1939 CBC with mild leukocytosis, otherwise normal.  [CS]  2022 Covid is neg. Trop neg.  [CS]  2022 BMP with elevated Cr compared with baseline, otherwise unremarkable.  [CS]    Clinical Course User Index [CS] Pollyann Savoy, MD    Final Clinical Impression(s) / ED Diagnoses Final diagnoses:  Symptomatic bradycardia  Atrial fibrillation with slow ventricular response (HCC)  AKI (acute kidney injury) [facility])    Rx / DC Orders ED Discharge Orders     None        Pollyann Savoy, MD 10/27/20 2046

## 2020-10-28 ENCOUNTER — Inpatient Hospital Stay (HOSPITAL_COMMUNITY)
Admission: EM | Disposition: A | Discharge status: Home / Self Care | Payer: Self-pay | Source: Home / Self Care | Attending: Internal Medicine

## 2020-10-28 ENCOUNTER — Encounter (HOSPITAL_COMMUNITY): Payer: Self-pay | Admitting: Internal Medicine

## 2020-10-28 DIAGNOSIS — I4821 Permanent atrial fibrillation: Secondary | ICD-10-CM

## 2020-10-28 DIAGNOSIS — I495 Sick sinus syndrome: Principal | ICD-10-CM

## 2020-10-28 HISTORY — PX: PACEMAKER IMPLANT: EP1218

## 2020-10-28 LAB — CBC
HCT: 37.3 % (ref 36.0–46.0)
Hemoglobin: 12.4 g/dL (ref 12.0–15.0)
MCH: 30.8 pg (ref 26.0–34.0)
MCHC: 33.2 g/dL (ref 30.0–36.0)
MCV: 92.6 fL (ref 80.0–100.0)
Platelets: 163 10*3/uL (ref 150–400)
RBC: 4.03 MIL/uL (ref 3.87–5.11)
RDW: 13.7 % (ref 11.5–15.5)
WBC: 9.9 10*3/uL (ref 4.0–10.5)
nRBC: 0 % (ref 0.0–0.2)

## 2020-10-28 LAB — CBG MONITORING, ED
Glucose-Capillary: 113 mg/dL — ABNORMAL HIGH (ref 70–99)
Glucose-Capillary: 116 mg/dL — ABNORMAL HIGH (ref 70–99)

## 2020-10-28 LAB — BASIC METABOLIC PANEL
Anion gap: 7 (ref 5–15)
BUN: 15 mg/dL (ref 8–23)
CO2: 25 mmol/L (ref 22–32)
Calcium: 9.6 mg/dL (ref 8.9–10.3)
Chloride: 105 mmol/L (ref 98–111)
Creatinine, Ser: 0.96 mg/dL (ref 0.44–1.00)
GFR, Estimated: >60 mL/min (ref >60)
Glucose, Bld: 115 mg/dL — ABNORMAL HIGH (ref 70–99)
Potassium: 4 mmol/L (ref 3.5–5.1)
Sodium: 137 mmol/L (ref 135–145)

## 2020-10-28 LAB — GLUCOSE, CAPILLARY: Glucose-Capillary: 114 mg/dL — ABNORMAL HIGH (ref 70–99)

## 2020-10-28 LAB — LIPID PANEL
Cholesterol: 138 mg/dL (ref 0–200)
HDL: 41 mg/dL (ref >40)
LDL Cholesterol: 61 mg/dL (ref 0–99)
Total CHOL/HDL Ratio: 3.4 RATIO
Triglycerides: 180 mg/dL — ABNORMAL HIGH (ref <150)
VLDL: 36 mg/dL (ref 0–40)

## 2020-10-28 LAB — HEMOGLOBIN A1C
Hgb A1c MFr Bld: 8.2 % — ABNORMAL HIGH (ref 4.8–5.6)
Mean Plasma Glucose: 189 mg/dL

## 2020-10-28 SURGERY — PACEMAKER IMPLANT

## 2020-10-28 MED ORDER — LIDOCAINE HCL (PF) 1 % IJ SOLN
INTRAMUSCULAR | Status: DC | PRN
  Administered 2020-10-28: 90 mL

## 2020-10-28 MED ORDER — LIDOCAINE HCL 1 % IJ SOLN
INTRAMUSCULAR | Status: AC
  Filled 2020-10-28: qty 20

## 2020-10-28 MED ORDER — FENTANYL CITRATE (PF) 100 MCG/2ML IJ SOLN
INTRAMUSCULAR | Status: AC
  Filled 2020-10-28: qty 2

## 2020-10-28 MED ORDER — NAPHAZOLINE-GLYCERIN 0.012-0.25 % OP SOLN
1.0000 [drp] | Freq: Four times a day (QID) | OPHTHALMIC | Status: DC | PRN
  Filled 2020-10-28: qty 15

## 2020-10-28 MED ORDER — CEFAZOLIN SODIUM-DEXTROSE 2-4 GM/100ML-% IV SOLN
2.0000 g | INTRAVENOUS | Status: AC
  Administered 2020-10-28: 2 g via INTRAVENOUS

## 2020-10-28 MED ORDER — SODIUM CHLORIDE 0.9 % IV SOLN
INTRAVENOUS | Status: AC
  Filled 2020-10-28: qty 2

## 2020-10-28 MED ORDER — CHLORHEXIDINE GLUCONATE 4 % EX LIQD
60.0000 mL | Freq: Once | CUTANEOUS | Status: DC
Start: 2020-10-28 — End: 2020-10-28

## 2020-10-28 MED ORDER — METOPROLOL TARTRATE 50 MG PO TABS
50.0000 mg | ORAL_TABLET | Freq: Two times a day (BID) | ORAL | Status: DC
  Administered 2020-10-28 – 2020-10-29 (×3): 50 mg via ORAL
  Filled 2020-10-28 (×3): qty 1

## 2020-10-28 MED ORDER — MIDAZOLAM HCL 5 MG/5ML IJ SOLN
INTRAMUSCULAR | Status: DC | PRN
  Administered 2020-10-28 (×2): 1 mg via INTRAVENOUS

## 2020-10-28 MED ORDER — MIDAZOLAM HCL 5 MG/5ML IJ SOLN
INTRAMUSCULAR | Status: AC
  Filled 2020-10-28: qty 5

## 2020-10-28 MED ORDER — CHLORHEXIDINE GLUCONATE 4 % EX LIQD: 60.0000 mL | Freq: Once | CUTANEOUS | Status: DC

## 2020-10-28 MED ORDER — CEFAZOLIN SODIUM-DEXTROSE 1-4 GM/50ML-% IV SOLN
1.0000 g | Freq: Four times a day (QID) | INTRAVENOUS | Status: DC
  Administered 2020-10-28 – 2020-10-29 (×2): 1 g via INTRAVENOUS
  Filled 2020-10-28 (×3): qty 50

## 2020-10-28 MED ORDER — SODIUM CHLORIDE 0.9 % IV SOLN: 250.0000 mL | INTRAVENOUS | Status: DC

## 2020-10-28 MED ORDER — GENTAMICIN SULFATE 40 MG/ML IJ SOLN
80.0000 mg | INTRAMUSCULAR | Status: DC
  Filled 2020-10-28: qty 2

## 2020-10-28 MED ORDER — SODIUM CHLORIDE 0.9% FLUSH: 3.0000 mL | INTRAVENOUS | Status: DC | PRN

## 2020-10-28 MED ORDER — SODIUM CHLORIDE 0.9% FLUSH
3.0000 mL | Freq: Two times a day (BID) | INTRAVENOUS | Status: DC
  Administered 2020-10-28: 3 mL via INTRAVENOUS

## 2020-10-28 MED ORDER — LIDOCAINE HCL 1 % IJ SOLN
INTRAMUSCULAR | Status: AC
  Filled 2020-10-28: qty 60

## 2020-10-28 MED ORDER — CEFAZOLIN SODIUM-DEXTROSE 2-4 GM/100ML-% IV SOLN
INTRAVENOUS | Status: AC
  Filled 2020-10-28: qty 100

## 2020-10-28 MED ORDER — HEPARIN (PORCINE) IN NACL 1000-0.9 UT/500ML-% IV SOLN
INTRAVENOUS | Status: AC
  Filled 2020-10-28: qty 500

## 2020-10-28 MED ORDER — FENTANYL CITRATE (PF) 100 MCG/2ML IJ SOLN
INTRAMUSCULAR | Status: DC | PRN
  Administered 2020-10-28 (×2): 25 ug via INTRAVENOUS

## 2020-10-28 MED ORDER — SODIUM CHLORIDE 0.9 % IV SOLN: INTRAVENOUS | Status: DC

## 2020-10-28 SURGICAL SUPPLY — 6 items
CABLE SURGICAL S-101-97-12 (CABLE) ×2 IMPLANT
LEAD TENDRIL MRI 58CM LPA1200M (Lead) ×1 IMPLANT
PACEMAKER ASSURITY SR-SF (Pacemaker) ×1 IMPLANT
PAD PRO RADIOLUCENT 2001M-C (PAD) ×2 IMPLANT
SHEATH 8FR PRELUDE SNAP 13 (SHEATH) ×1 IMPLANT
TRAY PACEMAKER INSERTION (PACKS) ×2 IMPLANT

## 2020-10-28 NOTE — Discharge Summary (Addendum)
ELECTROPHYSIOLOGY PROCEDURE DISCHARGE SUMMARY    Patient ID: Briana Foster,  MRN: [id number], DOB/AGE: 73-14-1947 73 y.o.  Admit date: 10/27/2020 Discharge date: 10/29/20  Primary Care Physician: Georgann Housekeeper, MD  Primary Cardiologist: Dr. Katrinka Blazing Electrophysiologist: Dr. 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  Implantation of a Abbott single chamber PPM on 10/28/20 by Dr Elberta Fortis.  The patient received    Abbott Medical Assurity MRI  model [id number] (serial number  [id number] ) pacemaker, Abbott Medical model Tendril MRI [id number] (serial number  [id number]) right ventricular lead  There were no immediate post procedure complications. CXR on 10/29/20 demonstrated no pneumothorax status post device implantation.   Brief HPI: Briana Foster is a 75 y.o. female developed new onst weakness, lightheaded spells, and suspect syncopal episode without injury associated with some unusual SOB.  She was found  by EMS hypotensive and bradycardic, treated with IVF with improvement in BP and mentation  Hospital Course:  The patient in the ER with AFib, HRs generally 40's with stable BP, labs unrevealing, and given hx of AFib w/RVR and new bradycardia, EP consulted. She was admitted her home BB and CCB held and underwent implantation of a PPM with details as outlined above.  She was monitored on telemetry developing Afib w/RVR of her nodal blockers, rates this AM prior to morning medisicines is 110's-120's.    Left  chest was without hematoma or ecchymosis.  The device was interrogated and found to be functioning normally.  CXR was obtained and demonstrated no pneumothorax status post device implantation.  Wound care, arm mobility, and restrictions were reviewed with the patient.  The patient feels well, denies any CP/SOB, with minimal site discomfort.  She was examined by Dr. Elberta Fortis and considered stable for discharge to home.   Resume eliquis tonight Anticipate once back on her medicines as usual, HR Jerney Baksh improve  Physical Exam: Vitals:   10/28/20 2316 10/29/20 0023 10/29/20 0400 10/29/20 0746  BP: (!) 145/89 (!) 163/121 (!) 149/88 (!) 139/93  Pulse: (!) 113 (!) 113  (!) 121  Resp:  20 20 19   Temp:  98.2 F (36.8 C) 98.2 F (36.8 C) 97.9 F (36.6 C)  TempSrc:  Oral Oral Oral  SpO2:  95% 93% 94%  Weight:      Height:        GEN- The patient is well appearing, alert and oriented x 3 today.   HEENT: normocephalic, atraumatic; sclera clear, conjunctiva pink; hearing intact; oropharynx clear; neck supple, no JVP Lungs- CTA b/l, normal work of breathing.  No wheezes, rales, rhonchi Heart- irreg-irreg, tachycardic, no murmurs, rubs or gallops, PMI not laterally displaced GI- soft, non-tender, non-distended Extremities- no clubbing, cyanosis, or edema MS- no significant deformity or atrophy Skin- warm and dry, no rash or lesion, left chest without hematoma/ecchymosis Psych- euthymic mood, full affect Neuro- no gross deficits   Labs:   Lab Results  Component Value Date   WBC 9.9 10/28/2020   HGB 12.4 10/28/2020  HCT 37.3 10/28/2020   MCV 92.6 10/28/2020   PLT 163 10/28/2020    Recent Labs  Lab 10/28/20 0453  NA 137  K 4.0  CL 105  CO2 25  BUN 15  CREATININE 0.96  CALCIUM 9.6  GLUCOSE 115*    Discharge Medications:  Allergies as of 10/29/2020       Reactions   Bee Venom Shortness Of Breath, Rash   Apricot Flavor Swelling   Eyes swell shut   Atorvastatin Itching   Eye  swelling   Wasp Venom Other (See Comments)        Medication List     TAKE these medications    albuterol 108 (90 Base) MCG/ACT inhaler Commonly known as: VENTOLIN HFA Inhale 2 puffs into the lungs every 4 (four) hours as needed for wheezing or shortness of breath.   ALLERGY EYE OP Place 1 drop into both eyes daily as needed (red/itchy eyes).   apixaban 5 MG Tabs tablet Commonly known as: ELIQUIS Take 1 tablet (5 mg total) by mouth 2 (two) times daily. Notes to patient: Resume this evening 10/29/20   aspirin 81 MG chewable tablet Chew 1 tablet (81 mg total) by mouth daily.   cetirizine 10 MG tablet Commonly known as: ZYRTEC Take 10 mg by mouth daily as needed for allergies.   diclofenac Sodium 1 % Gel Commonly known as: VOLTAREN Apply 1 application topically 2 (two) times daily as needed (pain).   diltiazem 180 MG 24 hr capsule Commonly known as: CARDIZEM CD Take 1 capsule (180 mg total) by mouth in the morning and at bedtime.   metoprolol tartrate 50 MG tablet Commonly known as: LOPRESSOR Take 1 tablet (50 mg total) by mouth 2 (two) times daily.   multivitamin with minerals tablet Take 1 tablet by mouth daily.   rOPINIRole 4 MG tablet Commonly known as: REQUIP Take 4 mg by mouth at bedtime.   rosuvastatin 20 MG tablet Commonly known as: CRESTOR Take 1 tablet (20 mg total) by mouth daily in the afternoon.               Discharge Care Instructions  (From admission, onward)           Start     Ordered   10/29/20 0000  Discharge wound care:       Comments: As per AVS   10/29/20 1029            Disposition: Home Discharge Instructions     Diet - low sodium heart healthy   Complete by: As directed    Discharge wound care:   Complete by: As directed    As per AVS   Increase activity slowly   Complete by: As directed        Follow-up Information     [facility] Follow up.   Specialty: Cardiology Why: 11/11/20  @ 9:20AM, wound check visit Contact information: [address], [address] [place] [address] [phone]        Regan Lemming, MD Follow up.   Specialty: Cardiology Why: 02/02/21 @ 2:45PM Contact information: [address] [place] [address] [phone]                 Duration of Discharge Encounter: Greater than 30 minutes including physician time.  Signed, Francis Dowse, PA-C 10/29/2020 10:30 AM   I have seen and examined this patient with Francis Dowse.  Agree with above, note added  to reflect my findings.  On exam, tachycardic, irregular.  She is now status post Abbott pacemaker for tachy/brady.  Device functioning appropriately.  Chest x-ray and interrogation without issue.  Plan for discharge today with follow-up in device clinic.  Avrum Kimball M. Chiamaka Latka MD 10/29/2020 10:34 AM

## 2020-10-28 NOTE — H&P (View-Only) (Signed)
Cardiology Consultation:   Patient ID: Briana Foster MRN: [id number]; DOB: [birth year]  Admit date: 10/27/2020 Date of Consult: 10/28/2020  PCP:  Georgann Housekeeper, MD   [facility] Providers Cardiologist:  Lesleigh Noe, MD  Electrophysiologist:  Lanier Prude, MD   Structural Heart: Dr. Clifton James  Patient Profile:   Briana Foster is a 75 y.o. 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 Rennis Golden.  History of Present Illness:   Briana Foster was last seen by Dr. Lalla Brothers 02/25/20, at that visit HR 120's on both lopressor and diltiazem and regime was up-tiotrated for better rate control.  Noting he was felt to be permanent AFib having failed rhythm control with amiodarone that had previously been stopped.  Briana saw the TAVR team June 2022, doing well less SOB post TAVR, VSS< HR controlled, no changes were made.  Briana was admitted yesterday to [facility] coming via EMS with progressive weakness, lightheadedness, developed nausea, and vomited once, and reported possible syncope.  EMS found her bradycardic with HRs 30's and hypotensive w/SBP in the 50's, and given IVF with improvement in MS and BP.  Briana was in AFib rates 30's-40's in the ER, BP better, admitted her home CCB and BB held.  HR subsequently improved and eventually with RVR 110's-120's, BP has remained stable  LABS K+ 4.5 > 4.0 Mag 2.0 BUN/Creat 19/1.64 > 15/0.96 HS Trop 4, 19 WBC 12.3 > 9.9 H?H 12/37 Plts 163    Past Medical History:  Diagnosis Date   Anemia    Arthritis 07-20-12   hands   Asthma    Back pain    Carotid arterial disease (HCC)    Class 1 obesity with serious comorbidity and body mass index (BMI) of 31.0 to 31.9 in adult 03/26/2018   Depression    Essential hypertension 03/26/2018   GERD  (gastroesophageal reflux disease)    "comes and goes" - no meds currently   Hyperlipidemia    Joint pain    Persistent atrial fibrillation (HCC)    Restless leg syndrome    Rheumatoid arthritis (HCC)    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 Clifton James & Dr. Cornelius Moras   Severe aortic stenosis    Type 2 diabetes mellitus without complication, without long-term current use of insulin (HCC) 04/10/2018   Vitamin D deficiency     Past Surgical History:  Procedure Laterality Date   BUNIONECTOMY  20 yrs ago   bil feet   BUNIONECTOMY  11/30/2011   Procedure: Arbutus Leas;  Surgeon: Drucilla Schmidt, MD;  Location: [facility] ORS;  Service: Orthopedics;  Laterality: Bilateral;  RIGHT FOOT EXCISION OF BUNIONETTE AND PARTIAL   PROXIMAL PHALANGECTOMY OF 5TH TOE LEFT FOOT FUNK BUNIONECTOMY,EXCISION OF BUNIONETTE    CARDIOVERSION N/A 11/05/2019   Procedure: CARDIOVERSION;  Surgeon: Lewayne Bunting, MD;  Location: [facility] ENDOSCOPY;  Service: Cardiovascular;  Laterality: N/A;   CARDIOVERSION N/A 12/05/2019   Procedure: CARDIOVERSION;  Surgeon: Parke Poisson, MD;  Location: [facility] ENDOSCOPY;  Service: Cardiovascular;  Laterality: N/A;   COLONOSCOPY WITH PROPOFOL N/A 08/07/2012   Procedure: COLONOSCOPY WITH PROPOFOL;  Surgeon: Charolett Bumpers, MD;  Location: [facility] ENDOSCOPY;  Service: Endoscopy;  Laterality: N/A;  MOUTH SURGERY     teeth extractions   RIGHT/LEFT HEART CATH AND CORONARY ANGIOGRAPHY N/A 06/17/2020   Procedure: RIGHT/LEFT HEART CATH AND CORONARY ANGIOGRAPHY;  Surgeon: Lyn Records, MD;  Location: [facility] INVASIVE CV LAB;  Service: Cardiovascular;  Laterality: N/A;   TONSILLECTOMY     TRANSCATHETER AORTIC VALVE REPLACEMENT, TRANSFEMORAL N/A 06/30/2020   Procedure: TRANSCATHETER AORTIC VALVE REPLACEMENT, TRANSFEMORAL;  Surgeon: Kathleene Hazel, MD;  Location: [facility] INVASIVE CV LAB;  Service: Open Heart Surgery;  Laterality:  N/A;   TUBAL LIGATION     WRIST SURGERY       Home Medications:  Prior to Admission medications   Medication Sig Start Date End Date Taking? Authorizing Provider  albuterol (VENTOLIN HFA) 108 (90 Base) MCG/ACT inhaler Inhale 2 puffs into the lungs every 4 (four) hours as needed for wheezing or shortness of breath.   Yes [provider]  apixaban (ELIQUIS) 5 MG TABS tablet Take 1 tablet (5 mg total) by mouth 2 (two) times daily. 07/01/20  Yes Janetta Hora, PA-C  aspirin 81 MG chewable tablet Chew 1 tablet (81 mg total) by mouth daily. 07/01/20  Yes Janetta Hora, PA-C  cetirizine (ZYRTEC) 10 MG tablet Take 10 mg by mouth daily as needed for allergies.   Yes [provider]  diclofenac Sodium (VOLTAREN) 1 % GEL Apply 1 application topically 2 (two) times daily as needed (pain). 02/21/20  Yes [provider]  diltiazem (CARDIZEM CD) 180 MG 24 hr capsule Take 1 capsule (180 mg total) by mouth in the morning and at bedtime. 06/10/20  Yes Kathleene Hazel, MD  metoprolol tartrate (LOPRESSOR) 50 MG tablet Take 1 tablet (50 mg total) by mouth 2 (two) times daily. 09/15/20  Yes Lyn Records, MD  Multiple Vitamins-Minerals (MULTIVITAMIN WITH MINERALS) tablet Take 1 tablet by mouth daily.   Yes [provider]  Naphazoline-Pheniramine (ALLERGY EYE OP) Place 1 drop into both eyes daily as needed (red/itchy eyes).   Yes [provider]  rOPINIRole (REQUIP) 4 MG tablet Take 4 mg by mouth at bedtime.   Yes [provider]  rosuvastatin (CRESTOR) 20 MG tablet Take 1 tablet (20 mg total) by mouth daily in the afternoon. 09/15/20  Yes Lyn Records, MD    Inpatient Medications: Scheduled Meds:  vitamin C  500 mg Oral Daily   aspirin  81 mg Oral Daily   insulin aspart  0-15 Units Subcutaneous TID WC   insulin aspart  0-5 Units Subcutaneous QHS   multivitamin with minerals  1 tablet Oral Daily   rOPINIRole  4 mg Oral QHS   rosuvastatin  20  mg Oral Q1500   Continuous Infusions:  PRN Meds: acetaminophen, albuterol, calcium carbonate, ondansetron (ZOFRAN) IV  Allergies:    Allergies  Allergen Reactions   Bee Venom Shortness Of Breath and Rash   Apricot Flavor Swelling    Eyes swell shut   Atorvastatin Itching    Eye swelling   Wasp Venom Other (See Comments)    Social History:   Social History   Socioeconomic History   Marital status: Widowed    Spouse name: Not on file   Number of children: 2   Years of education: Not on file   Highest education level: Not on file  Occupational History   Occupation: Retired-worked at [employer]  Tobacco Use   Smoking status: Never   Smokeless tobacco: Never  Vaping Use   Vaping Use: Never used  Substance and  Sexual Activity   Alcohol use: No   Drug use: No   Sexual activity: Not Currently  Other Topics Concern   Not on file  Social History Narrative   Not on file   Social Determinants of Health   Financial Resource Strain: Not on file  Food Insecurity: Not on file  Transportation Needs: Not on file  Physical Activity: Not on file  Stress: Not on file  Social Connections: Not on file  Intimate Partner Violence: Not on file    Family History:   Family History  Problem Relation Age of Onset   Heart disease Mother    Stroke Mother    Cancer Father        Prostate     ROS:  Please see the history of present illness.  All other ROS reviewed and negative.     Physical Exam/Data:   Vitals:   10/28/20 0145 10/28/20 0245 10/28/20 0415 10/28/20 0715  BP: (!) 147/101 (!) 157/107 (!) 133/103 (!) 123/94  Pulse: (!) 118 (!) 119 (!) 109 (!) 119  Resp: 18 19 16 17   Temp:      TempSrc:      SpO2: 91% 94% 92% 94%  Weight:      Height:        Intake/Output Summary (Last 24 hours) at 10/28/2020 0804 Last data filed at 10/27/2020 1910 Gross per 24 hour  Intake 500 ml  Output --  Net 500 ml   Last 3 Weights 10/27/2020 07/30/2020 07/08/2020  Weight (lbs) 190 lb  209 lb 209 lb  Weight (kg) 86.183 kg 94.802 kg 94.802 kg     Body mass index is 28.89 kg/m.  General:  Well nourished, well developed, in no acute distress HEENT: normal Neck: no JVD Vascular: No carotid bruits; Distal pulses 2+ bilaterally Cardiac:  irreg-irreg; no murmurs, gallops or rubs Lungs:  CTA b/l, no wheezing, rhonchi or rales  Abd: soft, nontender, Ext: no edema Musculoskeletal:  No deformities, Skin: warm and dry  Neuro: no focal abnormalities noted Psych:  Normal affect   EKG:  The EKG was personally reviewed and demonstrates:   AFib 49bpm, LBBB  OLD 07/08/20: AFib 71bpm, LBBB (new)  Telemetry:  Telemetry was personally reviewed and demonstrates:   AFib 30's >>> 120's  Relevant CV Studies:  09/10/20; TTE IMPRESSIONS   1. Afib with RVR noted during the study.   2. 23 mm S3 TAVR 06/30/2020. Vmax 1.9 m/s, MG 8.4 mmHG, EOA 1.81 cm2, DI  0.44. Normal prosthesis without evidence of regurgitation or paravalvular  leak. The aortic valve has been repaired/replaced. Aortic valve  regurgitation is not visualized. There is a   23 mm Edwards Sapien prosthetic (TAVR) valve present in the aortic  position. Procedure Date: 06/30/2020.   3. Left ventricular ejection fraction, by estimation, is 55 to 60%. The  left ventricle has normal function. The left ventricle has no regional  wall motion abnormalities. Left ventricular diastolic function could not  be evaluated.   4. Right ventricular systolic function is normal. The right ventricular  size is normal. There is normal pulmonary artery systolic pressure. The  estimated right ventricular systolic pressure is 33.4 mmHg.   5. The mitral valve is degenerative. Mild mitral valve regurgitation.  Mild mitral stenosis. The mean mitral valve gradient is 5.3 mmHg with  average heart rate of 126 bpm. Moderate to severe mitral annular  calcification.   6. The inferior vena cava is dilated in size with >50% respiratory  variability,  suggesting right atrial pressure of 8 mmHg.   Comparison(s): Changes from prior study are noted. TAVR in place with  normal function.    06/17/20; LHC Very poor rate control related to patient not taking medications.  Atrial fibrillation with RVR varying between 101 160 bpm.  Therefore, the patient was treated with IV metoprolol receiving 15 mg and 5 mg doses over 30 minutes before proceeding.  This decrease the heart rate into the 90 to 115 bpm range. Widely patent coronary arteries.  Vessels are tortuous but have no significant obstruction. Normal pulmonary artery pressures with mean pulmonary wedge 23 mmHg (influenced by rate). Variable RR interval made it difficult to appropriately assess left ventricular gradient.  "Eyeball" peak to peak gradient is 20 mmHg. AV coronary and mitral annular calcification noted on cine fluoroscopy. Cardiac output by Fick 4.82 L/min with index 1.99 L/min/m.  These findings are consistent with potential low-flow low gradient aortic stenosis as suspected.  Calculated aortic valve area 1.0 cm based upon a mean gradient of 14 mmHg.   RECOMMENDATIONS:   Back to Dr. Clifton James for CT imaging and referral to cardiac surgery for second opinion concerning management with TAVR versus SAVR.  Laboratory Data:  High Sensitivity Troponin:   Recent Labs  Lab 10/27/20 1823 10/27/20 2023  TROPONINIHS 4 19*     Chemistry Recent Labs  Lab 10/27/20 1823 10/28/20 0453  NA 136 137  K 4.5 4.0  CL 104 105  CO2 23 25  GLUCOSE 278* 115*  BUN 19 15  CREATININE 1.64* 0.96  CALCIUM 9.7 9.6  GFRNONAA 33* >60  ANIONGAP 9 7    No results for input(s): PROT, ALBUMIN, AST, ALT, ALKPHOS, BILITOT in the last 168 hours. Lipids  Recent Labs  Lab 10/28/20 0453  CHOL 138  TRIG 180*  HDL 41  LDLCALC 61  CHOLHDL 3.4    Hematology Recent Labs  Lab 10/27/20 1823 10/28/20 0453  WBC 12.3* 9.9  RBC 4.10 4.03  HGB 12.8 12.4  HCT 38.7 37.3  MCV 94.4 92.6  MCH 31.2  30.8  MCHC 33.1 33.2  RDW 13.7 13.7  PLT 173 163   Thyroid No results for input(s): TSH, FREET4 in the last 168 hours.  BNPNo results for input(s): BNP, PROBNP in the last 168 hours.  DDimer No results for input(s): DDIMER in the last 168 hours.   Radiology/Studies:  DG Chest Port 1 View  Result Date: 10/27/2020 CLINICAL DATA:  Dizziness with bradycardia EXAM: PORTABLE CHEST 1 VIEW COMPARISON:  06/26/2020 FINDINGS: No focal opacity or pleural effusion. Cardiac size upper limits of normal. No pneumothorax. Interval TAVR. IMPRESSION: No active disease. Electronically Signed   By: Jasmine Pang M.D.   On: 10/27/2020 19:13     Assessment and Plan:   Symptomatic bradycardia Permanent AFib CH2DS2Vasc is 5, on Eliquis >> hold for pacer Tachy-brady syndrome  Briana Foster need PPM Dr. Elberta Fortis has seen and examined the patient discussed ration for PPM recommendation Discussed implant procedure, potential risks and benefits The patient is agreeable to proceed  Resume rate control meds post implant Resume eliquis pending pocket stability  4. VHD, severe AS S/p TAVR May 2022 Stable function by echo last month  5. DM SSI     Risk Assessment/Risk Scores:     For questions or updates, please contact [facility] Please consult [email or web address] for contact info under    Signed, Briana Pigeon, PA-C  10/28/2020 8:04 AM  I have seen and examined  this patient with Francis Dowse.  Agree with above, note added to reflect my findings.  On exam, tachycardic, irregular, no murmurs.  Patient return to the hospital with bradycardia, weakness and fatigue.  Briana has permanent atrial fibrillation.  Her outpatient medications had been adjusted as Briana is also had episodes of rapid atrial fibrillation.  Due to this, we Thorvald Orsino plan for pacemaker implant.  Briana Foster has presented today for surgery, with the diagnosis of tachy/brady syndrome.  The various methods of treatment have been discussed  with the patient and family. After consideration of risks, benefits and other options for treatment, the patient has consented to  Procedure(s): Pacemaker implant as a surgical intervention .  Risks include but not limited to bleeding, infection, pneumothorax, perforation, tamponade, vascular damage, renal failure, MI, stroke, death, and lead dislodgement . The patient's history has been reviewed, patient examined, no change in status, stable for surgery.  I have reviewed the patient's chart and labs.  Questions were answered to the patient's satisfaction.    Saraiyah Hemminger Elberta Fortis, MD 10/28/2020 11:26 AM

## 2020-10-28 NOTE — Progress Notes (Signed)
RN aware

## 2020-10-28 NOTE — ED Notes (Signed)
Admitting Cardiology Fellow is aware of the patients change in HR from a rang e of 30-40 to 115-135 with Afib

## 2020-10-28 NOTE — Discharge Instructions (Signed)
    Supplemental Discharge Instructions for  Pacemaker/Defibrillator Patients   Activity No heavy lifting or vigorous activity with your left/right arm for 6 to 8 weeks.  Do not raise your left/right arm above your head for one week.  Gradually raise your affected arm as drawn below.             11/02/20                    11/03/20                    11/04/20                 11/05/20 __  NO DRIVING until cleared to at your wound check visit.  WOUND CARE Keep the wound area clean and dry.  Do not get this area wet , no showers for one week; you may shower on     . The tape/steri-strips on your wound will fall off; do not pull them off.  No bandage is needed on the site.  DO  NOT apply any creams, oils, or ointments to the wound area. If you notice any drainage or discharge from the wound, any swelling or bruising at the site, or you develop a fever > 101? F after you are discharged home, call the office at once.  Special Instructions You are still able to use cellular telephones; use the ear opposite the side where you have your pacemaker/defibrillator.  Avoid carrying your cellular phone near your device. When traveling through airports, show security personnel your identification card to avoid being screened in the metal detectors.  Ask the security personnel to use the hand wand. Avoid arc welding equipment, MRI testing (magnetic resonance imaging), TENS units (transcutaneous nerve stimulators).  Call the office for questions about other devices. Avoid electrical appliances that are in poor condition or are not properly grounded. Microwave ovens are safe to be near or to operate.

## 2020-10-28 NOTE — Consult Note (Addendum)
Cardiology Consultation:   Patient ID: Briana Foster MRN: [id number]; DOB: 09/14/1945  Admit date: 10/27/2020 Date of Consult: 10/28/2020  PCP:  Husain, Karrar, MD   [facility] Providers Cardiologist:  Henry W Smith III, MD  Electrophysiologist:  CAMERON T LAMBERT, MD   Structural Heart: Dr. McAlhany  Patient Profile:   Briana Foster is a 74 y.o. 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 Hilty.  History of Present Illness:   Briana Foster was last seen by Dr. Lambert 02/25/20, at that visit HR 120's on both lopressor and diltiazem and regime was up-tiotrated for better rate control.  Noting he was felt to be permanent AFib having failed rhythm control with amiodarone that had previously been stopped.  She saw the TAVR team June 2022, doing well less SOB post TAVR, VSS< HR controlled, no changes were made.  She was admitted yesterday to [facility] coming via EMS with progressive weakness, lightheadedness, developed nausea, and vomited once, and reported possible syncope.  EMS found her bradycardic with HRs 30's and hypotensive w/SBP in the 50's, and given IVF with improvement in MS and BP.  She was in AFib rates 30's-40's in the ER, BP better, admitted her home CCB and BB held.  HR subsequently improved and eventually with RVR 110's-120's, BP has remained stable  LABS K+ 4.5 > 4.0 Mag 2.0 BUN/Creat 19/1.64 > 15/0.96 HS Trop 4, 19 WBC 12.3 > 9.9 H?H 12/37 Plts 163    Past Medical History:  Diagnosis Date   Anemia    Arthritis 07-20-12   hands   Asthma    Back pain    Carotid arterial disease (HCC)    Class 1 obesity with serious comorbidity and body mass index (BMI) of 31.0 to 31.9 in adult 03/26/2018   Depression    Essential hypertension 03/26/2018   GERD  (gastroesophageal reflux disease)    "comes and goes" - no meds currently   Hyperlipidemia    Joint pain    Persistent atrial fibrillation (HCC)    Restless leg syndrome    Rheumatoid arthritis (HCC)    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 McAlhany & Dr. Owen   Severe aortic stenosis    Type 2 diabetes mellitus without complication, without long-term current use of insulin (HCC) 04/10/2018   Vitamin D deficiency     Past Surgical History:  Procedure Laterality Date   BUNIONECTOMY  20 yrs ago   bil feet   BUNIONECTOMY  11/30/2011   Procedure: BUNIONECTOMY;  Surgeon: James P Aplington, MD;  Location: [facility];  Service: Orthopedics;  Laterality: Bilateral;  RIGHT FOOT EXCISION OF BUNIONETTE AND PARTIAL   PROXIMAL PHALANGECTOMY OF 5TH TOE LEFT FOOT FUNK BUNIONECTOMY,EXCISION OF BUNIONETTE    CARDIOVERSION N/A 11/05/2019   Procedure: CARDIOVERSION;  Surgeon: Crenshaw, Brian S, MD;  Location: [facility];  Service: Cardiovascular;  Laterality: N/A;   CARDIOVERSION N/A 12/05/2019   Procedure: CARDIOVERSION;  Surgeon: Acharya, Gayatri A, MD;  Location: [facility];  Service: Cardiovascular;  Laterality: N/A;   COLONOSCOPY WITH PROPOFOL N/A 08/07/2012   Procedure: COLONOSCOPY WITH PROPOFOL;  Surgeon: Martin K Johnson, MD;  Location: [facility];  Service: Endoscopy;  Laterality: N/A;     MOUTH SURGERY     teeth extractions   RIGHT/LEFT HEART CATH AND CORONARY ANGIOGRAPHY N/A 06/17/2020   Procedure: RIGHT/LEFT HEART CATH AND CORONARY ANGIOGRAPHY;  Surgeon: Lyn Records, MD;  Location: [facility];  Service: Cardiovascular;  Laterality: N/A;   TONSILLECTOMY     TRANSCATHETER AORTIC VALVE REPLACEMENT, TRANSFEMORAL N/A 06/30/2020   Procedure: TRANSCATHETER AORTIC VALVE REPLACEMENT, TRANSFEMORAL;  Surgeon: Kathleene Hazel, MD;  Location: [facility];  Service: Open Heart Surgery;  Laterality:  N/A;   TUBAL LIGATION     WRIST SURGERY       Home Medications:  Prior to Admission medications   Medication Sig Start Date End Date Taking? Authorizing Provider  albuterol (VENTOLIN HFA) 108 (90 Base) MCG/ACT inhaler Inhale 2 puffs into the lungs every 4 (four) hours as needed for wheezing or shortness of breath.   Yes [provider]  apixaban (ELIQUIS) 5 MG TABS tablet Take 1 tablet (5 mg total) by mouth 2 (two) times daily. 07/01/20  Yes Janetta Hora, PA-C  aspirin 81 MG chewable tablet Chew 1 tablet (81 mg total) by mouth daily. 07/01/20  Yes Janetta Hora, PA-C  cetirizine (ZYRTEC) 10 MG tablet Take 10 mg by mouth daily as needed for allergies.   Yes [provider]  diclofenac Sodium (VOLTAREN) 1 % GEL Apply 1 application topically 2 (two) times daily as needed (pain). 02/21/20  Yes [provider]  diltiazem (CARDIZEM CD) 180 MG 24 hr capsule Take 1 capsule (180 mg total) by mouth in the morning and at bedtime. 06/10/20  Yes Kathleene Hazel, MD  metoprolol tartrate (LOPRESSOR) 50 MG tablet Take 1 tablet (50 mg total) by mouth 2 (two) times daily. 09/15/20  Yes Lyn Records, MD  Multiple Vitamins-Minerals (MULTIVITAMIN WITH MINERALS) tablet Take 1 tablet by mouth daily.   Yes [provider]  Naphazoline-Pheniramine (ALLERGY EYE OP) Place 1 drop into both eyes daily as needed (red/itchy eyes).   Yes [provider]  rOPINIRole (REQUIP) 4 MG tablet Take 4 mg by mouth at bedtime.   Yes [provider]  rosuvastatin (CRESTOR) 20 MG tablet Take 1 tablet (20 mg total) by mouth daily in the afternoon. 09/15/20  Yes Lyn Records, MD    Inpatient Medications: Scheduled Meds:  vitamin C  500 mg Oral Daily   aspirin  81 mg Oral Daily   insulin aspart  0-15 Units Subcutaneous TID WC   insulin aspart  0-5 Units Subcutaneous QHS   multivitamin with minerals  1 tablet Oral Daily   rOPINIRole  4 mg Oral QHS   rosuvastatin  20  mg Oral Q1500   Continuous Infusions:  PRN Meds: acetaminophen, albuterol, calcium carbonate, ondansetron (ZOFRAN) IV  Allergies:    Allergies  Allergen Reactions   Bee Venom Shortness Of Breath and Rash   Apricot Flavor Swelling    Eyes swell shut   Atorvastatin Itching    Eye swelling   Wasp Venom Other (See Comments)    Social History:   Social History   Socioeconomic History   Marital status: Widowed    Spouse name: Not on file   Number of children: 2   Years of education: Not on file   Highest education level: Not on file  Occupational History   Occupation: Retired-worked at [employer]  Tobacco Use   Smoking status: Never   Smokeless tobacco: Never  Vaping Use   Vaping Use: Never used  Substance and  Sexual Activity   Alcohol use: No   Drug use: No   Sexual activity: Not Currently  Other Topics Concern   Not on file  Social History Narrative   Not on file   Social Determinants of Health   Financial Resource Strain: Not on file  Food Insecurity: Not on file  Transportation Needs: Not on file  Physical Activity: Not on file  Stress: Not on file  Social Connections: Not on file  Intimate Partner Violence: Not on file    Family History:   Family History  Problem Relation Age of Onset   Heart disease Mother    Stroke Mother    Cancer Father        Prostate     ROS:  Please see the history of present illness.  All other ROS reviewed and negative.     Physical Exam/Data:   Vitals:   10/28/20 0145 10/28/20 0245 10/28/20 0415 10/28/20 0715  BP: (!) 147/101 (!) 157/107 (!) 133/103 (!) 123/94  Pulse: (!) 118 (!) 119 (!) 109 (!) 119  Resp: 18 19 16 17   Temp:      TempSrc:      SpO2: 91% 94% 92% 94%  Weight:      Height:        Intake/Output Summary (Last 24 hours) at 10/28/2020 0804 Last data filed at 10/27/2020 1910 Gross per 24 hour  Intake 500 ml  Output --  Net 500 ml   Last 3 Weights 10/27/2020 07/30/2020 07/08/2020  Weight (lbs) 190 lb  209 lb 209 lb  Weight (kg) 86.183 kg 94.802 kg 94.802 kg     Body mass index is 28.89 kg/m.  General:  Well nourished, well developed, in no acute distress HEENT: normal Neck: no JVD Vascular: No carotid bruits; Distal pulses 2+ bilaterally Cardiac:  irreg-irreg; no murmurs, gallops or rubs Lungs:  CTA b/l, no wheezing, rhonchi or rales  Abd: soft, nontender, Ext: no edema Musculoskeletal:  No deformities, Skin: warm and dry  Neuro: no focal abnormalities noted Psych:  Normal affect   EKG:  The EKG was personally reviewed and demonstrates:   AFib 49bpm, LBBB  OLD 07/08/20: AFib 71bpm, LBBB (new)  Telemetry:  Telemetry was personally reviewed and demonstrates:   AFib 30's >>> 120's  Relevant CV Studies:  09/10/20; TTE IMPRESSIONS   1. Afib with RVR noted during the study.   2. 23 mm S3 TAVR 06/30/2020. Vmax 1.9 m/s, MG 8.4 mmHG, EOA 1.81 cm2, DI  0.44. Normal prosthesis without evidence of regurgitation or paravalvular  leak. The aortic valve has been repaired/replaced. Aortic valve  regurgitation is not visualized. There is a   23 mm Edwards Sapien prosthetic (TAVR) valve present in the aortic  position. Procedure Date: 06/30/2020.   3. Left ventricular ejection fraction, by estimation, is 55 to 60%. The  left ventricle has normal function. The left ventricle has no regional  wall motion abnormalities. Left ventricular diastolic function could not  be evaluated.   4. Right ventricular systolic function is normal. The right ventricular  size is normal. There is normal pulmonary artery systolic pressure. The  estimated right ventricular systolic pressure is 33.4 mmHg.   5. The mitral valve is degenerative. Mild mitral valve regurgitation.  Mild mitral stenosis. The mean mitral valve gradient is 5.3 mmHg with  average heart rate of 126 bpm. Moderate to severe mitral annular  calcification.   6. The inferior vena cava is dilated in size with >50% respiratory  variability,  suggesting right atrial pressure of 8 mmHg.   Comparison(s): Changes from prior study are noted. TAVR in place with  normal function.    06/17/20; LHC Very poor rate control related to patient not taking medications.  Atrial fibrillation with RVR varying between 101 160 bpm.  Therefore, the patient was treated with IV metoprolol receiving 15 mg and 5 mg doses over 30 minutes before proceeding.  This decrease the heart rate into the 90 to 115 bpm range. Widely patent coronary arteries.  Vessels are tortuous but have no significant obstruction. Normal pulmonary artery pressures with mean pulmonary wedge 23 mmHg (influenced by rate). Variable RR interval made it difficult to appropriately assess left ventricular gradient.  "Eyeball" peak to peak gradient is 20 mmHg. AV coronary and mitral annular calcification noted on cine fluoroscopy. Cardiac output by Fick 4.82 L/min with index 1.99 L/min/m.  These findings are consistent with potential low-flow low gradient aortic stenosis as suspected.  Calculated aortic valve area 1.0 cm based upon a mean gradient of 14 mmHg.   RECOMMENDATIONS:   Back to Dr. Clifton James for CT imaging and referral to cardiac surgery for second opinion concerning management with TAVR versus SAVR.  Laboratory Data:  High Sensitivity Troponin:   Recent Labs  Lab 10/27/20 1823 10/27/20 2023  TROPONINIHS 4 19*     Chemistry Recent Labs  Lab 10/27/20 1823 10/28/20 0453  NA 136 137  K 4.5 4.0  CL 104 105  CO2 23 25  GLUCOSE 278* 115*  BUN 19 15  CREATININE 1.64* 0.96  CALCIUM 9.7 9.6  GFRNONAA 33* >60  ANIONGAP 9 7    No results for input(s): PROT, ALBUMIN, AST, ALT, ALKPHOS, BILITOT in the last 168 hours. Lipids  Recent Labs  Lab 10/28/20 0453  CHOL 138  TRIG 180*  HDL 41  LDLCALC 61  CHOLHDL 3.4    Hematology Recent Labs  Lab 10/27/20 1823 10/28/20 0453  WBC 12.3* 9.9  RBC 4.10 4.03  HGB 12.8 12.4  HCT 38.7 37.3  MCV 94.4 92.6  MCH 31.2  30.8  MCHC 33.1 33.2  RDW 13.7 13.7  PLT 173 163   Thyroid No results for input(s): TSH, FREET4 in the last 168 hours.  BNPNo results for input(s): BNP, PROBNP in the last 168 hours.  DDimer No results for input(s): DDIMER in the last 168 hours.   Radiology/Studies:  DG Chest Port 1 View  Result Date: 10/27/2020 CLINICAL DATA:  Dizziness with bradycardia EXAM: PORTABLE CHEST 1 VIEW COMPARISON:  06/26/2020 FINDINGS: No focal opacity or pleural effusion. Cardiac size upper limits of normal. No pneumothorax. Interval TAVR. IMPRESSION: No active disease. Electronically Signed   By: Jasmine Pang M.D.   On: 10/27/2020 19:13     Assessment and Plan:   Symptomatic bradycardia Permanent AFib CH2DS2Vasc is 5, on Eliquis >> hold for pacer Tachy-brady syndrome  She Briana Foster need PPM Dr. Elberta Foster has seen and examined the patient discussed ration for PPM recommendation Discussed implant procedure, potential risks and benefits The patient is agreeable to proceed  Resume rate control meds post implant Resume eliquis pending pocket stability  4. VHD, severe AS S/p TAVR May 2022 Stable function by echo last month  5. DM SSI     Risk Assessment/Risk Scores:     For questions or updates, please contact [facility] Please consult [email or web address] for contact info under    Signed, Briana Pigeon, PA-C  10/28/2020 8:04 AM  I have seen and examined  this patient with Briana Foster.  Agree with above, note added to reflect my findings.  On exam, tachycardic, irregular, no murmurs.  Patient return to the hospital with bradycardia, weakness and fatigue.  She has permanent atrial fibrillation.  Her outpatient medications had been adjusted as she is also had episodes of rapid atrial fibrillation.  Due to this, we Fiza Foster plan for pacemaker implant.  Briana Foster has presented today for surgery, with the diagnosis of tachy/brady syndrome.  The various methods of treatment have been discussed  with the patient and family. After consideration of risks, benefits and other options for treatment, the patient has consented to  Procedure(s): Pacemaker implant as a surgical intervention .  Risks include but not limited to bleeding, infection, pneumothorax, perforation, tamponade, vascular damage, renal failure, MI, stroke, death, and lead dislodgement . The patient's history has been reviewed, patient examined, no change in status, stable for surgery.  I have reviewed the patient's chart and labs.  Questions were answered to the patient's satisfaction.    Briana Lochridge Elberta Fortis, MD 10/28/2020 11:26 AM

## 2020-10-28 NOTE — Interval H&P Note (Signed)
History and Physical Interval Note:  10/28/2020 11:28 AM  Briana Foster  has presented today for surgery, with the diagnosis of tachy-brady.  The various methods of treatment have been discussed with the patient and family. After consideration of risks, benefits and other options for treatment, the patient has consented to  Procedure(s): PACEMAKER IMPLANT (N/A) as a surgical intervention.  The patient's history has been reviewed, patient examined, no change in status, stable for surgery.  I have reviewed the patient's chart and labs.  Questions were answered to the patient's satisfaction.     Tasmia Blumer [employer]

## 2020-10-28 NOTE — ED Notes (Signed)
Consulting providers at bedside

## 2020-10-29 ENCOUNTER — Encounter (HOSPITAL_COMMUNITY): Payer: Self-pay | Admitting: Cardiology

## 2020-10-29 ENCOUNTER — Inpatient Hospital Stay (HOSPITAL_COMMUNITY): Payer: Medicare Other

## 2020-10-29 LAB — GLUCOSE, CAPILLARY: Glucose-Capillary: 137 mg/dL — ABNORMAL HIGH (ref 70–99)

## 2020-10-29 MED ORDER — DILTIAZEM HCL ER COATED BEADS 240 MG PO CP24
240.0000 mg | ORAL_CAPSULE | Freq: Every day | ORAL | Status: DC
  Administered 2020-10-29: 240 mg via ORAL
  Filled 2020-10-29: qty 1

## 2020-10-29 MED FILL — Heparin Sod (Porcine)-NaCl IV Soln 1000 Unit/500ML-0.9%: INTRAVENOUS | Qty: 500 | Status: AC

## 2020-10-29 MED FILL — Gentamicin Sulfate Inj 40 MG/ML: INTRAMUSCULAR | Qty: 80 | Status: AC

## 2020-10-29 NOTE — Progress Notes (Signed)
   10/29/20 1030  Clinical Encounter Type  Visited With Patient and family together  Visit Type Initial  Referral From Nurse  Consult/Referral To Chaplain   Chaplain responded to Advance Directive consult. Chaplain explained what an AD is and gave Pt the paperwork. No further questions at this time. Pt plans on completing the paperwork after discharge today.  This note was prepared by Chaplain Resident, Tacy Learn, MDiv. Chaplain remains available as needed through the on-call pager: [phone].

## 2020-11-11 ENCOUNTER — Ambulatory Visit (INDEPENDENT_AMBULATORY_CARE_PROVIDER_SITE_OTHER): Payer: Medicare Other

## 2020-11-11 ENCOUNTER — Other Ambulatory Visit: Payer: Self-pay

## 2020-11-11 DIAGNOSIS — I4891 Unspecified atrial fibrillation: Secondary | ICD-10-CM

## 2020-11-11 LAB — CUP PACEART INCLINIC DEVICE CHECK
Battery Remaining Longevity: 120 mo
Battery Voltage: 3.08 V
Brady Statistic RV Percent Paced: 7.2 %
Date Time Interrogation Session: 20221012095640
Implantable Lead Implant Date: 20220928
Implantable Lead Location: 753860
Implantable Pulse Generator Implant Date: 20220928
Lead Channel Impedance Value: 537.5 Ohm
Lead Channel Pacing Threshold Amplitude: 0.75 V
Lead Channel Pacing Threshold Pulse Width: 0.4 ms
Lead Channel Sensing Intrinsic Amplitude: 11.2 mV
Lead Channel Setting Pacing Amplitude: 3.5 V
Lead Channel Setting Pacing Pulse Width: 0.4 ms
Lead Channel Setting Sensing Sensitivity: 2 mV
Pulse Gen Model: 1272
Pulse Gen Serial Number: 3937998

## 2020-11-11 MED ORDER — METOPROLOL TARTRATE 50 MG PO TABS: 75.0000 mg | ORAL_TABLET | Freq: Two times a day (BID) | ORAL | 1 refills | Status: DC

## 2020-11-11 NOTE — Patient Instructions (Signed)

## 2020-11-11 NOTE — Progress Notes (Signed)
Wound check appointment. Steri-strips removed. Wound without redness or edema. Incision edges approximated, wound well healed. Normal device function. Thresholds, sensing, and impedances consistent with implant measurements. Device programmed at 3.5V/auto capture programmed on for extra safety margin until 3 month visit. Histogram distribution appropriate for patient and level of activity. No high ventricular rates noted. Patient educated about wound care, arm mobility, lifting restrictions. ROV in 3 months with Dr. Lalla Brothers.  Presenting rhythm ~ VS 110-130's. See previous notes from hospital admission that discusses rates. Patient is asymptomatic today. Consulted with A. Tillery, PA-C, verbal order to increase Lopressor to 75 mg BID and f/u with Renee U., PA-C in 3 weeks. Patient advised and orders/schedule placed. Patient advised to call with any further questions or concerns.

## 2020-11-12 DIAGNOSIS — R531 Weakness: Secondary | ICD-10-CM | POA: Diagnosis not present

## 2020-11-12 DIAGNOSIS — E1165 Type 2 diabetes mellitus with hyperglycemia: Secondary | ICD-10-CM | POA: Diagnosis not present

## 2020-11-12 DIAGNOSIS — J449 Chronic obstructive pulmonary disease, unspecified: Secondary | ICD-10-CM | POA: Diagnosis not present

## 2020-11-12 DIAGNOSIS — R42 Dizziness and giddiness: Secondary | ICD-10-CM | POA: Diagnosis not present

## 2020-11-12 DIAGNOSIS — M069 Rheumatoid arthritis, unspecified: Secondary | ICD-10-CM | POA: Diagnosis not present

## 2020-11-12 DIAGNOSIS — I495 Sick sinus syndrome: Secondary | ICD-10-CM | POA: Diagnosis not present

## 2020-11-12 DIAGNOSIS — Z23 Encounter for immunization: Secondary | ICD-10-CM | POA: Diagnosis not present

## 2020-11-29 NOTE — Progress Notes (Signed)
Cardiology Office Note Date:  12/02/2020  Patient ID:  Briana Foster, Briana Foster September 30, 1945, MRN [id number] PCP:  Georgann Housekeeper, MD  Cardiologist:  Dr. Katrinka Blazing Electrophysiologist: Dr. Lalla Brothers    Chief Complaint:  f/u HR post medicine titration  History of Present Illness: MITSY OWEN is a 74 y.o. 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  EMS found her bradycardic with HRs 30's and hypotensive w/SBP in the 50's, and given IVF with improvement in MS and BP.   She was in AFib rates 30's-40's in the ER, BP better, admitted her home CCB and BB held >> AFib w/RVR, diagnosed with tachy/brady and underwent PPM implant with Dr. Elberta Fortis Discharged 10/29/20  She had her wound check visit, healed well, acute outputs remained, HRs elevated and her lopressor increased with plans to have early follow up with APP.  TODAY She feels OK Mentions CP but this is only at night and when she lays on her L die a ache/shotting type pain.  None otherwise, no exertional symptoms, no SOB No near syncope or syncope. No bleeding or signs of bleeding, reports excellent compliance with her emdicines    Device information Abbott dual chamber PPM implanted 10/29/20   Past Medical History:  Diagnosis Date   Anemia    Arthritis 07-20-12   hands   Asthma    Back pain    Carotid arterial disease (HCC)    Class 1 obesity with serious comorbidity and body mass index (BMI) of 31.0 to 31.9 in adult 03/26/2018   Depression    Essential hypertension 03/26/2018   GERD (gastroesophageal reflux disease)    "comes and goes" - no meds currently   Hyperlipidemia    Joint pain    Persistent atrial fibrillation (HCC)    Restless leg syndrome    Rheumatoid arthritis  (HCC)    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 Clifton James & Dr. Cornelius Moras   Severe aortic stenosis    Type 2 diabetes mellitus without complication, without long-term current use of insulin (HCC) 04/10/2018   Vitamin D deficiency     Past Surgical History:  Procedure Laterality Date   BUNIONECTOMY  20 yrs ago   bil feet   BUNIONECTOMY  11/30/2011   Procedure: Arbutus Leas;  Surgeon: Drucilla Schmidt, MD;  Location: [facility]S;  Service: Orthopedics;  Laterality: Bilateral;  RIGHT FOOT EXCISION OF BUNIONETTE AND PARTIAL   PROXIMAL PHALANGECTOMY OF 5TH TOE LEFT FOOT FUNK BUNIONECTOMY,EXCISION OF BUNIONETTE    CARDIOVERSION N/A 11/05/2019   Procedure: CARDIOVERSION;  Surgeon: Lewayne Bunting, MD;  Location: [facility] ENDOSCOPY;  Service: Cardiovascular;  Laterality: N/A;   CARDIOVERSION N/A 12/05/2019   Procedure: CARDIOVERSION;  Surgeon: Parke Poisson, MD;  Location: [facility] ENDOSCOPY;  Service: Cardiovascular;  Laterality: N/A;   COLONOSCOPY WITH PROPOFOL N/A 08/07/2012   Procedure: COLONOSCOPY WITH PROPOFOL;  Surgeon: Charolett Bumpers, MD;  Location: [facility] ENDOSCOPY;  Service: Endoscopy;  Laterality: N/A;   MOUTH SURGERY     teeth extractions   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   Procedure: PACEMAKER IMPLANT;  Surgeon: Regan Lemming, MD;  Location: [facility] INVASIVE CV LAB;  Service: Cardiovascular;  Laterality: N/A;   RIGHT/LEFT  HEART CATH AND CORONARY ANGIOGRAPHY N/A 06/17/2020   Procedure: RIGHT/LEFT HEART CATH AND CORONARY ANGIOGRAPHY;  Surgeon: Lyn Records, MD;  Location: [facility] INVASIVE CV LAB;  Service: Cardiovascular;  Laterality: N/A;   TONSILLECTOMY     TRANSCATHETER AORTIC VALVE REPLACEMENT, TRANSFEMORAL N/A 06/30/2020   Procedure: TRANSCATHETER AORTIC VALVE REPLACEMENT, TRANSFEMORAL;  Surgeon: Kathleene Hazel, MD;  Location: [facility] INVASIVE CV LAB;  Service: Open Heart Surgery;  Laterality: N/A;   TUBAL LIGATION      WRIST SURGERY      Current Outpatient Medications  Medication Sig Dispense Refill   albuterol (VENTOLIN HFA) 108 (90 Base) MCG/ACT inhaler Inhale 2 puffs into the lungs every 4 (four) hours as needed for wheezing or shortness of breath.     apixaban (ELIQUIS) 5 MG TABS tablet Take 1 tablet (5 mg total) by mouth 2 (two) times daily.     aspirin 81 MG chewable tablet Chew 1 tablet (81 mg total) by mouth daily. 90 tablet 1   cetirizine (ZYRTEC) 10 MG tablet Take 10 mg by mouth daily as needed for allergies.     diclofenac Sodium (VOLTAREN) 1 % GEL Apply 1 application topically 2 (two) times daily as needed (pain).     metFORMIN (GLUCOPHAGE-XR) 750 MG 24 hr tablet Take 750 mg by mouth 2 (two) times daily.     metoprolol tartrate (LOPRESSOR) 50 MG tablet Take 1.5 tablets (75 mg total) by mouth 2 (two) times daily. 180 tablet 1   Multiple Vitamins-Minerals (MULTIVITAMIN WITH MINERALS) tablet Take 1 tablet by mouth daily.     Naphazoline-Pheniramine (ALLERGY EYE OP) Place 1 drop into both eyes daily as needed (red/itchy eyes).     rOPINIRole (REQUIP) 4 MG tablet Take 4 mg by mouth at bedtime.     rosuvastatin (CRESTOR) 20 MG tablet Take 1 tablet (20 mg total) by mouth daily in the afternoon. 90 tablet 1   diltiazem (CARDIZEM CD) 240 MG 24 hr capsule Take 1 capsule (240 mg total) by mouth in the morning and at bedtime. 90 capsule 1   No current facility-administered medications for this visit.    Allergies:   Bee venom, Apricot flavor, Atorvastatin, and Wasp venom   Social History:  The patient  reports that she has never smoked. She has never used smokeless tobacco. She reports that she does not drink alcohol and does not use drugs.   Family History:  The patient's family history includes Cancer in her father; Heart disease in her mother; Stroke in her mother.  ROS:  Please see the history of present illness.    All other systems are reviewed and otherwise negative.   PHYSICAL EXAM:  VS:   BP 130/88   Pulse (!) 123   Ht 5\' 7"  (1.702 m)   Wt 214 lb (97.1 kg)   BMI 33.52 kg/m  BMI: Body mass index is 33.52 kg/m. Well nourished, well developed, in no acute distress HEENT: normocephalic, atraumatic Neck: no JVD, carotid bruits or masses Cardiac:  irreg-irreg, tachycardic; no significant murmurs, no rubs, or gallops Lungs:  CTA b/l, no wheezing, rhonchi or rales Abd: soft, nontender MS: no deformity or  atrophy Ext:  no edema Skin: warm and dry, no rash Neuro:  No gross deficits appreciated Psych: euthymic mood, full affect  PPM site is stable, no tethering or discomfort, well healed   EKG:  done today and reviewed by myself AFib 123bpm  Device interrogation done today and reviewed by myself:  Battery and lead  measurements are good 11% VP HR histograms note majority 100-130 She does though have HRs 60's-100s as well, some faster then 103   09/10/20; TTE IMPRESSIONS   1. Afib with RVR noted during the study.   2. 23 mm S3 TAVR 06/30/2020. Vmax 1.9 m/s, MG 8.4 mmHG, EOA 1.81 cm2, DI  0.44. Normal prosthesis without evidence of regurgitation or paravalvular  leak. The aortic valve has been repaired/replaced. Aortic valve  regurgitation is not visualized. There is a   23 mm Edwards Sapien prosthetic (TAVR) valve present in the aortic  position. Procedure Date: 06/30/2020.   3. Left ventricular ejection fraction, by estimation, is 55 to 60%. The  left ventricle has normal function. The left ventricle has no regional  wall motion abnormalities. Left ventricular diastolic function could not  be evaluated.   4. Right ventricular systolic function is normal. The right ventricular  size is normal. There is normal pulmonary artery systolic pressure. The  estimated right ventricular systolic pressure is 33.4 mmHg.   5. The mitral valve is degenerative. Mild mitral valve regurgitation.  Mild mitral stenosis. The mean mitral valve gradient is 5.3 mmHg with  average heart  rate of 126 bpm. Moderate to severe mitral annular  calcification.   6. The inferior vena cava is dilated in size with >50% respiratory  variability, suggesting right atrial pressure of 8 mmHg.   Comparison(s): Changes from prior study are noted. TAVR in place with  normal function.      06/17/20; LHC Very poor rate control related to patient not taking medications.  Atrial fibrillation with RVR varying between 101 160 bpm.  Therefore, the patient was treated with IV metoprolol receiving 15 mg and 5 mg doses over 30 minutes before proceeding.  This decrease the heart rate into the 90 to 115 bpm range. Widely patent coronary arteries.  Vessels are tortuous but have no significant obstruction. Normal pulmonary artery pressures with mean pulmonary wedge 23 mmHg (influenced by rate). Variable RR interval made it difficult to appropriately assess left ventricular gradient.  "Eyeball" peak to peak gradient is 20 mmHg. AV coronary and mitral annular calcification noted on cine fluoroscopy. Cardiac output by Fick 4.82 L/min with index 1.99 L/min/m.  These findings are consistent with potential low-flow low gradient aortic stenosis as suspected.  Calculated aortic valve area 1.0 cm based upon a mean gradient of 14 mmHg.   RECOMMENDATIONS:   Back to Dr. Clifton James for CT imaging and referral to cardiac surgery for second opinion concerning management with TAVR versus SAVR.  Recent Labs: 06/21/2020: B Natriuretic Peptide 250.5 06/26/2020: ALT 25 07/01/2020: Magnesium 2.0 10/28/2020: BUN 15; Creatinine, Ser 0.96; Hemoglobin 12.4; Platelets 163; Potassium 4.0; Sodium 137  10/28/2020: Cholesterol 138; HDL 41; LDL Cholesterol 61; Total CHOL/HDL Ratio 3.4; Triglycerides 180; VLDL 36   CrCl cannot be calculated (Patient's most recent lab result is older than the maximum 21 days allowed.).   Wt Readings from Last 3 Encounters:  12/02/20 214 lb (97.1 kg)  10/27/20 190 lb (86.2 kg)  07/30/20 209 lb (94.8 kg)      Other studies reviewed: Additional studies/records reviewed today include: summarized above  ASSESSMENT AND PLAN:  PPM Intact function No programming changes made Acute implant lead outputs remain  Permanent AFib CHA2DS2Vasc is 5, on Eliquis She is not stuck going fast though the majority ore in the faster side  Increase diltiazem to  daily Send a transmission Monday to see if her HR histogram has shifted left at all,  see where her rates are  PVD Carotid disease On ASA, statin C/w Dr. Katrinka Blazing  VHD S/p TAVR May 2022  HTN Looks OK, should tolerate more dilt She gets similar readings at home  Disposition: F/u as above, see her back otherwise in 3-4 weeks if HR remain difficult to control.   Current medicines are reviewed at length with the patient today.  The patient did not have any concerns regarding medicines.  Briana Fredrickson, PA-C 12/02/2020 9:57 AM     [facility] [address] [place] [address] [phone] (office)  [phone] (fax)

## 2020-12-02 ENCOUNTER — Other Ambulatory Visit: Payer: Self-pay

## 2020-12-02 ENCOUNTER — Ambulatory Visit: Payer: Medicare Other | Admitting: Physician Assistant

## 2020-12-02 ENCOUNTER — Encounter: Payer: Self-pay | Admitting: Physician Assistant

## 2020-12-02 VITALS — BP 130/88 | HR 123 | Ht 67.0 in | Wt 214.0 lb

## 2020-12-02 DIAGNOSIS — Z950 Presence of cardiac pacemaker: Secondary | ICD-10-CM | POA: Diagnosis not present

## 2020-12-02 DIAGNOSIS — I10 Essential (primary) hypertension: Secondary | ICD-10-CM | POA: Diagnosis not present

## 2020-12-02 DIAGNOSIS — I4821 Permanent atrial fibrillation: Secondary | ICD-10-CM | POA: Diagnosis not present

## 2020-12-02 DIAGNOSIS — Z952 Presence of prosthetic heart valve: Secondary | ICD-10-CM

## 2020-12-02 DIAGNOSIS — I779 Disorder of arteries and arterioles, unspecified: Secondary | ICD-10-CM | POA: Diagnosis not present

## 2020-12-02 LAB — CUP PACEART INCLINIC DEVICE CHECK
Battery Remaining Longevity: 121 mo
Battery Voltage: 3.07 V
Brady Statistic RV Percent Paced: 11 %
Date Time Interrogation Session: 20221102101920
Implantable Lead Implant Date: 20220928
Implantable Lead Location: 753860
Implantable Pulse Generator Implant Date: 20220928
Lead Channel Impedance Value: 587.5 Ohm
Lead Channel Pacing Threshold Amplitude: 0.5 V
Lead Channel Pacing Threshold Amplitude: 0.5 V
Lead Channel Pacing Threshold Pulse Width: 0.4 ms
Lead Channel Pacing Threshold Pulse Width: 0.4 ms
Lead Channel Sensing Intrinsic Amplitude: 12 mV
Lead Channel Setting Pacing Amplitude: 3.5 V
Lead Channel Setting Pacing Pulse Width: 0.4 ms
Lead Channel Setting Sensing Sensitivity: 2 mV
Pulse Gen Model: 1272
Pulse Gen Serial Number: 3937998

## 2020-12-02 MED ORDER — DILTIAZEM HCL ER COATED BEADS 240 MG PO CP24
240.0000 mg | ORAL_CAPSULE | Freq: Two times a day (BID) | ORAL | 1 refills | Status: DC
Start: 2020-12-02 — End: 2021-03-04

## 2020-12-02 NOTE — Patient Instructions (Signed)
Medication Instructions:   START TAKING DILTIAZEM 240 MG ONCE A DAY   *If you need a refill on your cardiac medications before your next appointment, please call your pharmacy*   Lab Work:NONE ORDERED  TODAY    If you have labs (blood work) drawn today and your tests are completely normal, you will receive your results only by: MyChart Message (if you have MyChart) OR A paper copy in the mail If you have any lab test that is abnormal or we need to change your treatment, we will call you to review the results.   Testing/Procedures: NONE ORDERED  TODAY    Follow-Up: At [facility] you and your health needs are our priority.  As part of our continuing mission to provide you with exceptional heart care, we have created designated Provider Care Teams.  These Care Teams include your primary Cardiologist (physician) and Advanced Practice Providers (APPs -  Physician Assistants and Nurse Practitioners) who all work together to provide you with the care you need, when you need it.  We recommend signing up for the patient portal called "MyChart".  Sign up information is provided on this After Visit Summary.  MyChart is used to connect with patients for Virtual Visits (Telemedicine).  Patients are able to view lab/test results, encounter notes, upcoming appointments, etc.  Non-urgent messages can be sent to your provider as well.   To learn more about what you can do with MyChart, go to [email or web address].    Your next appointment:    3 month(s)  The format for your next appointment:   In Person  Provider:   You will see one of the following Advanced Practice Providers on your designated Care Team:   Francis Dowse, [place] Casimiro Needle "Mardelle Matte" Lanna Poche, [place]     Other Instructions

## 2021-01-08 ENCOUNTER — Ambulatory Visit: Payer: Medicare Other | Admitting: Interventional Cardiology

## 2021-01-18 DIAGNOSIS — Z03818 Encounter for observation for suspected exposure to other biological agents ruled out: Secondary | ICD-10-CM | POA: Diagnosis not present

## 2021-01-18 DIAGNOSIS — Z20822 Contact with and (suspected) exposure to covid-19: Secondary | ICD-10-CM | POA: Diagnosis not present

## 2021-01-27 ENCOUNTER — Ambulatory Visit (INDEPENDENT_AMBULATORY_CARE_PROVIDER_SITE_OTHER): Payer: Medicare Other

## 2021-01-27 DIAGNOSIS — I4821 Permanent atrial fibrillation: Secondary | ICD-10-CM | POA: Diagnosis not present

## 2021-01-28 LAB — CUP PACEART REMOTE DEVICE CHECK
Battery Remaining Longevity: 110 mo
Battery Remaining Percentage: 95.5 %
Battery Voltage: 3.05 V
Brady Statistic RV Percent Paced: 8.1 %
Date Time Interrogation Session: 20221229095020
Implantable Lead Implant Date: 20220928
Implantable Lead Location: 753860
Implantable Pulse Generator Implant Date: 20220928
Lead Channel Impedance Value: 580 Ohm
Lead Channel Pacing Threshold Amplitude: 0.5 V
Lead Channel Pacing Threshold Pulse Width: 0.4 ms
Lead Channel Sensing Intrinsic Amplitude: 10.2 mV
Lead Channel Setting Pacing Amplitude: 3.5 V
Lead Channel Setting Pacing Pulse Width: 0.4 ms
Lead Channel Setting Sensing Sensitivity: 2 mV
Pulse Gen Model: 1272
Pulse Gen Serial Number: 3937998

## 2021-02-02 ENCOUNTER — Encounter: Payer: Medicare Other | Admitting: Cardiology

## 2021-02-03 ENCOUNTER — Encounter: Payer: Medicare Other | Admitting: Cardiology

## 2021-02-09 NOTE — Progress Notes (Signed)
Remote pacemaker transmission.   

## 2021-03-03 NOTE — Progress Notes (Signed)
Electrophysiology Office Note Date: 03/04/2021  ID:  Arnice, Hodum [birth year], MRN [id number]  PCP: Wenda Low, MD Primary Cardiologist: Sinclair Grooms, MD Electrophysiologist: Vickie Epley, MD   CC: Pacemaker follow-up  Briana Foster is a 76 y.o. female seen today for Vickie Epley, MD for routine electrophysiology followup.  Since last being seen in our clinic the patient reports doing very well, despite elevated HRs.  she denies chest pain, palpitations, dyspnea, PND, orthopnea, nausea, vomiting, dizziness, syncope, edema, weight gain, or early satiety.  Device History: St. 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 Angelena Form & Dr. Roxy Manns   Severe aortic stenosis    Type 2 diabetes mellitus without complication, without long-term current use of insulin ([place]) 04/10/2018   Vitamin D deficiency    Past Surgical History:  Procedure Laterality Date   BUNIONECTOMY  20 yrs ago   bil feet   BUNIONECTOMY  11/30/2011   Procedure: Lillard Anes;  Surgeon: Magnus Sinning, MD;  Location: [facility];  Service: Orthopedics;  Laterality: Bilateral;  RIGHT FOOT EXCISION OF BUNIONETTE AND PARTIAL   PROXIMAL PHALANGECTOMY OF 5TH TOE LEFT FOOT FUNK BUNIONECTOMY,EXCISION OF BUNIONETTE    CARDIOVERSION N/A 11/05/2019   Procedure: CARDIOVERSION;   Surgeon: Lelon Perla, MD;  Location: [facility] ENDOSCOPY;  Service: Cardiovascular;  Laterality: N/A;   CARDIOVERSION N/A 12/05/2019   Procedure: CARDIOVERSION;  Surgeon: Elouise Munroe, MD;  Location: [place];  Service: Cardiovascular;  Laterality: N/A;   COLONOSCOPY WITH PROPOFOL N/A 08/07/2012   Procedure: COLONOSCOPY WITH PROPOFOL;  Surgeon: Garlan Fair, MD;  Location: [facility] ENDOSCOPY;  Service: Endoscopy;  Laterality: N/A;   MOUTH SURGERY     teeth extractions   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   Procedure: PACEMAKER IMPLANT;  Surgeon: Constance Haw, MD;  Location: [place] CV LAB;  Service: Cardiovascular;  Laterality: N/A;   RIGHT/LEFT HEART CATH AND CORONARY ANGIOGRAPHY N/A 06/17/2020   Procedure: RIGHT/LEFT HEART CATH AND CORONARY ANGIOGRAPHY;  Surgeon: Belva Crome, MD;  Location: [place] CV LAB;  Service: Cardiovascular;  Laterality: N/A;   TONSILLECTOMY     TRANSCATHETER AORTIC VALVE REPLACEMENT, TRANSFEMORAL N/A 06/30/2020   Procedure: TRANSCATHETER AORTIC VALVE REPLACEMENT, TRANSFEMORAL;  Surgeon: Burnell Blanks, MD;  Location: [place] CV LAB;  Service: Open Heart Surgery;  Laterality: N/A;   TUBAL LIGATION     WRIST SURGERY      Current Outpatient Medications  Medication Sig Dispense Refill   albuterol (VENTOLIN HFA) 108 (90 Base) MCG/ACT inhaler Inhale 2 puffs into the lungs every 4 (four) hours as needed for wheezing or shortness of breath.     apixaban (ELIQUIS) 5 MG TABS tablet Take 1 tablet (  5 mg total) by mouth 2 (two) times daily.     aspirin 81 MG chewable tablet Chew 1 tablet (81 mg total) by mouth daily. 90 tablet 1   cetirizine (ZYRTEC) 10 MG tablet Take 10 mg by mouth daily as needed for allergies.     Cyanocobalamin (VITAMIN B-12 PO) Take 1 tablet by mouth daily.     diclofenac Sodium (VOLTAREN) 1 % GEL Apply 1 application topically 2 (two) times daily as needed (pain).     diltiazem (CARDIZEM CD) 240 MG 24 hr capsule Take 1  capsule (240 mg total) by mouth in the morning and at bedtime. 90 capsule 1   meclizine (ANTIVERT) 25 MG tablet Take 25 mg by mouth 2 (two) times daily as needed.     metFORMIN (GLUCOPHAGE-XR) 750 MG 24 hr tablet Take 750 mg by mouth 2 (two) times daily.     metoprolol tartrate (LOPRESSOR) 50 MG tablet Take 1.5 tablets (75 mg total) by mouth 2 (two) times daily. 180 tablet 1   Multiple Vitamins-Minerals (MULTIVITAMIN WITH MINERALS) tablet Take 1 tablet by mouth daily.     Naphazoline-Pheniramine (ALLERGY EYE OP) Place 1 drop into both eyes daily as needed (red/itchy eyes).     rOPINIRole (REQUIP) 4 MG tablet Take 4 mg by mouth at bedtime.     rosuvastatin (CRESTOR) 20 MG tablet Take 1 tablet (20 mg total) by mouth daily in the afternoon. 90 tablet 1   No current facility-administered medications for this visit.    Allergies:   Bee venom, Apricot flavor, Atorvastatin, and Wasp venom   Social History: Social History   Socioeconomic History   Marital status: Widowed    Spouse name: Not on file   Number of children: 2   Years of education: Not on file   Highest education level: Not on file  Occupational History   Occupation: Retired-worked at [employer] Use   Smoking status: Never   Smokeless tobacco: Never  Vaping Use   Vaping Use: Never used  Substance and Sexual Activity   Alcohol use: No   Drug use: No   Sexual activity: Not Currently  Other Topics Concern   Not on file  Social History Narrative   Not on file   Social Determinants of Health   Financial Resource Strain: Not on file  Food Insecurity: Not on file  Transportation Needs: Not on file  Physical Activity: Not on file  Stress: Not on file  Social Connections: Not on file  Intimate Partner Violence: Not on file    Family History: Family History  Problem Relation Age of Onset   Heart disease Mother    Stroke Mother    Cancer Father        Prostate     Review of Systems: All other systems  reviewed and are otherwise negative except as noted above.  Physical Exam: Vitals:   03/04/21 0858  BP: 120/62  Pulse: 62  SpO2: 97%  Weight: 217 lb (98.4 kg)  Height: 5\' 7"  (1.702 m)     GEN- The patient is well appearing, alert and oriented x 3 today.   HEENT: normocephalic, atraumatic; sclera clear, conjunctiva pink; hearing intact; oropharynx clear; neck supple  Lungs- Clear to ausculation bilaterally, normal work of breathing.  No wheezes, rales, rhonchi Heart- Regular rate and rhythm, no murmurs, rubs or gallops  GI- soft, non-tender, non-distended, bowel sounds present  Extremities- no clubbing or cyanosis. No edema MS- no significant deformity or atrophy Skin-  warm and dry, no rash or lesion; PPM pocket well healed Psych- euthymic mood, full affect Neuro- strength and sensation are intact  PPM Interrogation- reviewed in detail today,  See PACEART report  EKG:  EKG is not ordered today.  Recent Labs: 06/21/2020: B Natriuretic Peptide 250.5 06/26/2020: ALT 25 07/01/2020: Magnesium 2.0 10/28/2020: BUN 15; Creatinine, Ser 0.96; Hemoglobin 12.4; Platelets 163; Potassium 4.0; Sodium 137   Wt Readings from Last 3 Encounters:  03/04/21 217 lb (98.4 kg)  12/02/20 214 lb (97.1 kg)  10/27/20 190 lb (86.2 kg)     Other studies Reviewed: Additional studies/ records that were reviewed today include: Previous EP office notes, Previous remote checks, Most recent labwork.   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 We had her listed as BID. Will change to diltiazem 180 BID.  If rates do not improve I worry she will ultimately require AV nodal ablation.    PVD Carotid disease On ASA, statin C/w Dr. Tamala Julian   VHD S/p TAVR May 2022   HTN Stable on current regimen   Current medicines are  reviewed at length with the patient today.     Disposition:   Follow up with  MD  in 3 months. I will clarify her attending. Dr. Curt Bears placed PPM, but Dr. Quentin Ore had followed prior.    Jacalyn Lefevre, PA-C  03/04/2021 9:24 AM  [place]  [address] [phone] (office) [phone] (fax)

## 2021-03-04 ENCOUNTER — Ambulatory Visit: Payer: Medicare Other | Admitting: Student

## 2021-03-04 ENCOUNTER — Other Ambulatory Visit: Payer: Self-pay

## 2021-03-04 ENCOUNTER — Encounter: Payer: Self-pay | Admitting: Student

## 2021-03-04 VITALS — BP 120/62 | HR 62 | Ht 67.0 in | Wt 217.0 lb

## 2021-03-04 DIAGNOSIS — I779 Disorder of arteries and arterioles, unspecified: Secondary | ICD-10-CM

## 2021-03-04 DIAGNOSIS — I4821 Permanent atrial fibrillation: Secondary | ICD-10-CM

## 2021-03-04 DIAGNOSIS — I10 Essential (primary) hypertension: Secondary | ICD-10-CM

## 2021-03-04 DIAGNOSIS — Z952 Presence of prosthetic heart valve: Secondary | ICD-10-CM | POA: Diagnosis not present

## 2021-03-04 DIAGNOSIS — Z950 Presence of cardiac pacemaker: Secondary | ICD-10-CM

## 2021-03-04 LAB — CUP PACEART INCLINIC DEVICE CHECK
Battery Remaining Longevity: 117 mo
Battery Voltage: 3.04 V
Brady Statistic RV Percent Paced: 8.5 %
Date Time Interrogation Session: 20230202093455
Implantable Lead Implant Date: 20220928
Implantable Lead Location: 753860
Implantable Pulse Generator Implant Date: 20220928
Lead Channel Impedance Value: 575 Ohm
Lead Channel Sensing Intrinsic Amplitude: 11.1 mV
Lead Channel Setting Pacing Amplitude: 3.5 V
Lead Channel Setting Pacing Pulse Width: 0.4 ms
Lead Channel Setting Sensing Sensitivity: 2 mV
Pulse Gen Model: 1272
Pulse Gen Serial Number: 3937998

## 2021-03-04 MED ORDER — DILTIAZEM HCL ER COATED BEADS 180 MG PO CP24: 180.0000 mg | ORAL_CAPSULE | Freq: Two times a day (BID) | ORAL | 3 refills | Status: DC

## 2021-03-04 NOTE — Patient Instructions (Signed)
Medication Instructions:  Your physician has recommended you make the following change in your medication:   INCREASE: Diltiazem to 180mg  twice daily  *If you need a refill on your cardiac medications before your next appointment, please call your pharmacy*   Lab Work: None If you have labs (blood work) drawn today and your tests are completely normal, you will receive your results only by: MyChart Message (if you have MyChart) OR A paper copy in the mail If you have any lab test that is abnormal or we need to change your treatment, we will call you to review the results.   Follow-Up: At [facility], you and your health needs are our priority.  As part of our continuing mission to provide you with exceptional heart care, we have created designated Provider Care Teams.  These Care Teams include your primary Cardiologist (physician) and Advanced Practice Providers (APPs -  Physician Assistants and Nurse Practitioners) who all work together to provide you with the care you need, when you need it.  Your next appointment:   3 month(s)  The format for your next appointment:   In Person  Provider:   Loman BrooklynWill Camnitz, MD

## 2021-03-30 ENCOUNTER — Other Ambulatory Visit: Payer: Self-pay | Admitting: Physician Assistant

## 2021-03-30 DIAGNOSIS — Z952 Presence of prosthetic heart valve: Secondary | ICD-10-CM

## 2021-04-28 ENCOUNTER — Ambulatory Visit: Payer: Medicare Other

## 2021-05-16 NOTE — Progress Notes (Signed)
?Cardiology Office Note:   ? ?Date:  05/19/2021  ? ?IDGWYNETH Foster, DOB 08-Sep-1945, MRN [id number] ? ?PCP:  Wenda Low, MD  ?Cardiologist:  Sinclair Grooms, MD  ? ?Referring MD: Wenda Low, MD  ? ?Chief Complaint  ?Patient presents with  ? Congestive Heart Failure  ? Hypertension  ? Hyperlipidemia  ? ? ?History of Present Illness:   ? ?Briana Foster is a 76 y.o. 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 ? ? ?Is doing well status post TAVR.  She had to undergo dual-chamber pacemaker placements meant for tachybradycardia syndrome.  She is in atrial fibrillation continuously.  Rate control seems to be a problem.  She feels there has been difficulty on Lanoxin.  Current regimen is diltiazem and metoprolol.  She is losing her hair and feels it may be related to metoprolol.  She has no upcoming appointment with Dr. Curt Bears. ? ?Past Medical History:  ?Diagnosis Date  ? Anemia   ? Arthritis 07-20-12  ? hands  ? Asthma   ? Back pain   ? Carotid arterial disease ([place])   ? Class 1 obesity with serious comorbidity and body mass index (BMI) of 31.0 to 31.9 in adult 03/26/2018  ? Depression   ? Essential hypertension 03/26/2018  ? GERD (gastroesophageal reflux disease)   ? "comes and goes" - no meds currently  ? Hyperlipidemia   ? Joint pain   ? Persistent atrial fibrillation ([place])   ? Restless leg syndrome   ? Rheumatoid arthritis ([place])   ? 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 s/p TAVR with a 23 mm Edwards S3U via the TF approach by Dr. Angelena Form & Dr. Roxy Manns  ? Severe aortic stenosis   ? Type 2 diabetes mellitus without complication, without long-term current use of insulin ([place]) 04/10/2018  ? Vitamin D deficiency   ? ? ?Past Surgical History:  ?Procedure Laterality  Date  ? BUNIONECTOMY  20 yrs ago  ? bil feet  ? BUNIONECTOMY  11/30/2011  ? Procedure: BUNIONECTOMY;  Surgeon: Magnus Sinning, MD;  Location: [facility];  Service: Orthopedics;  Laterality: Bilateral;  RIGHT FOOT EXCISION OF BUNIONETTE AND PARTIAL   PROXIMAL PHALANGECTOMY OF 5TH TOE ?LEFT FOOT FUNK BUNIONECTOMY,EXCISION OF BUNIONETTE   ? CARDIOVERSION N/A 11/05/2019  ? Procedure: CARDIOVERSION;  Surgeon: Lelon Perla, MD;  Location: [facility] ENDOSCOPY;  Service: Cardiovascular;  Laterality: N/A;  ? CARDIOVERSION N/A 12/05/2019  ? Procedure: CARDIOVERSION;  Surgeon: Elouise Munroe, MD;  Location: [facility] ENDOSCOPY;  Service: Cardiovascular;  Laterality: N/A;  ? COLONOSCOPY WITH PROPOFOL N/A 08/07/2012  ? Procedure: COLONOSCOPY WITH PROPOFOL;  Surgeon: Garlan Fair, MD;  Location: [facility] ENDOSCOPY;  Service: Endoscopy;  Laterality: N/A;  ? MOUTH SURGERY    ? teeth extractions  ? 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  ? Procedure: PACEMAKER IMPLANT;  Surgeon: Constance Haw, MD;  Location: [place] CV LAB;  Service: Cardiovascular;  Laterality: N/A;  ? RIGHT/LEFT HEART CATH AND CORONARY ANGIOGRAPHY N/A 06/17/2020  ? Procedure: RIGHT/LEFT HEART CATH AND CORONARY ANGIOGRAPHY;  Surgeon: Belva Crome, MD;  Location: [place] CV LAB;  Service: Cardiovascular;  Laterality: N/A;  ? TONSILLECTOMY    ? TRANSCATHETER AORTIC VALVE REPLACEMENT, TRANSFEMORAL N/A 06/30/2020  ? Procedure: TRANSCATHETER AORTIC VALVE REPLACEMENT,  TRANSFEMORAL;  Surgeon: Burnell Blanks, MD;  Location: [place] [facility] LAB;  Service: Open Heart Surgery;  Laterality: N/A;  ? TUBAL LIGATION    ? WRIST SURGERY    ? ? ?Current Medications: ?Current Meds  ?Medication Sig  ? albuterol (VENTOLIN HFA) 108 (90 Base) MCG/ACT inhaler Inhale 2 puffs into the lungs every 4 (four) hours as needed for wheezing or shortness of breath.  ? apixaban (ELIQUIS) 5 MG TABS tablet Take 1 tablet (5 mg total) by mouth 2 (two) times daily.  ? aspirin 81 MG chewable tablet  Chew 1 tablet (81 mg total) by mouth daily.  ? cetirizine (ZYRTEC) 10 MG tablet Take 10 mg by mouth daily as needed for allergies.  ? Cyanocobalamin (VITAMIN B-12 PO) Take 1 tablet by mouth daily.  ? diclofenac Sodium (VOLTAREN) 1 % GEL Apply 1 application topically 2 (two) times daily as needed (pain).  ? diltiazem (CARDIZEM CD) 180 MG 24 hr capsule Take 1 capsule (180 mg total) by mouth in the morning and at bedtime.  ? meclizine (ANTIVERT) 25 MG tablet Take 25 mg by mouth 2 (two) times daily as needed.  ? metFORMIN (GLUCOPHAGE-XR) 750 MG 24 hr tablet Take 750 mg by mouth 2 (two) times daily.  ? metoprolol tartrate (LOPRESSOR) 50 MG tablet Take 1.5 tablets (75 mg total) by mouth 2 (two) times daily.  ? Multiple Vitamins-Minerals (MULTIVITAMIN WITH MINERALS) tablet Take 1 tablet by mouth daily.  ? Naphazoline-Pheniramine (ALLERGY EYE OP) Place 1 drop into both eyes daily as needed (red/itchy eyes).  ? rOPINIRole (REQUIP) 4 MG tablet Take 4 mg by mouth at bedtime.  ? rosuvastatin (CRESTOR) 20 MG tablet Take 1 tablet (20 mg total) by mouth daily in the afternoon.  ?  ? ?Allergies:   Bee venom, Apricot flavor, Atorvastatin, and Wasp venom  ? ?Social History  ? ?Socioeconomic History  ? Marital status: Widowed  ?  Spouse name: Not on file  ? Number of children: 2  ? Years of education: Not on file  ? Highest education level: Not on file  ?Occupational History  ? Occupation: Retired-worked at [employer]  ?Tobacco Use  ? Smoking status: Never  ? Smokeless tobacco: Never  ?Vaping Use  ? Vaping Use: Never used  ?Substance and Sexual Activity  ? Alcohol use: No  ? Drug use: No  ? Sexual activity: Not Currently  ?Other Topics Concern  ? Not on file  ?Social History Narrative  ? Not on file  ? ?Social Determinants of Health  ? ?Financial Resource Strain: Not on file  ?Food Insecurity: Not on file  ?Transportation Needs: Not on file  ?Physical Activity: Not on file  ?Stress: Not on file  ?Social Connections: Not on file  ?   ? ?Family History: ?The patient's family history includes Cancer in her father; Heart disease in her mother; Stroke in her mother. ? ?ROS:   ?Please see the history of present illness.    ?In weight.  Wants to take orlistat.  Concerned about hair loss.  All other systems reviewed and are negative. ? ?EKGs/Labs/Other Studies Reviewed:   ? ?The following studies were reviewed today: ? 2 D Doppler ECHOCARDIOGRAM: ?IMPRESSIONS  ? ? ? 1. Afib with RVR noted during the study.  ? 2. 23 mm S3 TAVR 06/30/2020. Vmax 1.9 m/s, MG 8.4 mmHG, EOA 1.81 cm2, DI  ?0.44. Normal prosthesis without evidence of regurgitation or paravalvular  ?leak. The aortic valve has been repaired/replaced. Aortic valve  ?  regurgitation is not visualized. There is a  ? 23 mm Edwards Sapien prosthetic (TAVR) valve present in the aortic  ?position. Procedure Date: 06/30/2020.  ? 3. Left ventricular ejection fraction, by estimation, is 55 to 60%. The  ?left ventricle has normal function. The left ventricle has no regional  ?wall motion abnormalities. Left ventricular diastolic function could not  ?be evaluated.  ? 4. Right ventricular systolic function is normal. The right ventricular  ?size is normal. There is normal pulmonary artery systolic pressure. The  ?estimated right ventricular systolic pressure is [id number] mmHg.  ? 5. The mitral valve is degenerative. Mild mitral valve regurgitation.  ?Mild mitral stenosis. The mean mitral valve gradient is 5.3 mmHg with  ?average heart rate of 126 bpm. Moderate to severe mitral annular  ?calcification.  ? 6. The inferior vena cava is dilated in size with >50% respiratory  ?variability, suggesting right atrial pressure of 8 mmHg.  ? ?Comparison(s): Changes from prior study are noted. TAVR in place with  ?normal function.  ? ?EKG:  EKG not performed ? ?Recent Labs: ?06/21/2020: B Natriuretic Peptide 250.5 ?06/26/2020: ALT 25 ?07/01/2020: Magnesium 2.0 ?10/28/2020: BUN 15; Creatinine, Ser 0.96; Hemoglobin 12.4; Platelets  163; Potassium 4.0; Sodium 137  ?Recent Lipid Panel ?   ?Component Value Date/Time  ? CHOL 138 10/28/2020 0453  ? TRIG 180 (H) 10/28/2020 0453  ? HDL 41 10/28/2020 0453  ? CHOLHDL 3.4 10/28/2020 0453  ? VLDL 36

## 2021-05-19 ENCOUNTER — Ambulatory Visit: Payer: Medicare Other | Admitting: Interventional Cardiology

## 2021-05-19 ENCOUNTER — Encounter: Payer: Self-pay | Admitting: Interventional Cardiology

## 2021-05-19 VITALS — BP 118/68 | HR 136 | Ht 67.0 in | Wt 214.2 lb

## 2021-05-19 DIAGNOSIS — Z7901 Long term (current) use of anticoagulants: Secondary | ICD-10-CM

## 2021-05-19 DIAGNOSIS — I10 Essential (primary) hypertension: Secondary | ICD-10-CM

## 2021-05-19 DIAGNOSIS — Z952 Presence of prosthetic heart valve: Secondary | ICD-10-CM | POA: Diagnosis not present

## 2021-05-19 DIAGNOSIS — I4821 Permanent atrial fibrillation: Secondary | ICD-10-CM | POA: Diagnosis not present

## 2021-05-19 DIAGNOSIS — E785 Hyperlipidemia, unspecified: Secondary | ICD-10-CM

## 2021-05-19 DIAGNOSIS — Z950 Presence of cardiac pacemaker: Secondary | ICD-10-CM

## 2021-05-19 NOTE — Patient Instructions (Signed)
Medication Instructions:  ?Your physician recommends that you continue on your current medications as directed. Please refer to the Current Medication list given to you today. ? ?*If you need a refill on your cardiac medications before your next appointment, please call your pharmacy* ? ? ?Lab Work: ?None ?If you have labs (blood work) drawn today and your tests are completely normal, you will receive your results only by: ?MyChart Message (if you have MyChart) OR ?A paper copy in the mail ?If you have any lab test that is abnormal or we need to change your treatment, we will call you to review the results. ? ? ?Testing/Procedures: ?None ? ? ?Follow-Up: ?At [facility], you and your health needs are our priority.  As part of our continuing mission to provide you with exceptional heart care, we have created designated Provider Care Teams.  These Care Teams include your primary Cardiologist (physician) and Advanced Practice Providers (APPs -  Physician Assistants and Nurse Practitioners) who all work together to provide you with the care you need, when you need it. ? ?We recommend signing up for the patient portal called "MyChart".  Sign up information is provided on this After Visit Summary.  MyChart is used to connect with patients for Virtual Visits (Telemedicine).  Patients are able to view lab/test results, encounter notes, upcoming appointments, etc.  Non-urgent messages can be sent to your provider as well.   ?To learn more about what you can do with MyChart, go to [email or web address].   ? ?Your next appointment:   ?1 year(s) ? ?The format for your next appointment:   ?In Person ? ?Provider:   ?Henry W Smith III, MD  ? ? ?Other Instructions ? ?Important Information About Sugar ? ? ? ? ?  ?

## 2021-05-27 NOTE — Progress Notes (Signed)
?[facility]   ?Briana Foster ?                                    ?Cardiology Office Note:   ? ?Date:  05/28/2021  ? ?Briana Foster, DOB 1945-06-17, MRN [id number] ? ?PCP:  Wenda Low, MD  ?[facility] Cardiologist:  Sinclair Grooms, MD / Dr. Angelena Form & Dr. Roxy Manns (TAVR) ?[facility] Electrophysiologist:  Will Meredith Leeds, MD  ? ?Referring MD: Wenda Low, MD  ? ?1 year s/p TAVR ? ?History of Present Illness:   ? ?Briana Foster is a 76 y.o. 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 ? ?She is followed by Dr. Tamala Julian and Dr. Quentin Ore in the outpatient setting for aortic stenosis and afib. She is felt to have permanent atrial fibrillation having failed to maintain NSR on amiodarone. A rate control strategy with metoprolol and long acting diltiazem was recommended. Echo 05/11/20 showed EF 55-60%, mild LVH, moderate MAC with trivial MR, severe LFLG AS with mean gradient 29 mm Hg, peak gradient 31 mm hg, AVA 0.73 cm2, DVI 0.23, SVI 19. Of note, she was in rapid afib during this echo evaluation. She complained of exertional fatigue and dyspnea. She was referred to Dr. Angelena Form for structural heart evaluation and seen on 06/10/20 and noted to have afib with RVR during that visit and started back on oral amio for better rate control. This was later discontinued due to nausea. ?  ?She underwent diagnostic L/RHC on 06/17/20 which showed widely patent coronary arteries (vessels tortuous but with no significant obstruction).  ?  ?She then returned to the hospital on 06/21/20 via EMS for marked fatigue and generalized weakness and found to have afib with RVR. She was admitted from 5/21-5/25/22 for rate control and underwent  TAVR work up and was evaluated by the multidisciplinary valve team. She was seen by EP who felt continued medical therapy was warranted (vs AV node ablation + PPM). Rates eventually controlled with just long acting cardizem and Lopressor. Plans were made for discharge home and to return 06/30/20 for TAVR. ? ?She underwent successful TAVR with a 23 mm Edwards Sapien 3 Ultra THV via the TF approach on 06/30/20. Post operative echo showed EF 55%, normally functioning TAVR with a mean gradient of 11 mmHg and no PVL. She was resume on home AV nodal blocking agents as well as Eliquis and started on a baby aspirin 81 mg daily x 6 months. 1 month echo showed EF 55%, normally functioning TAVR with a mean gradient of 8.4 mmHg and no PVL. Mild MR/MS. Moderate MAC.  ? ?Given ongoing issues with tachy-brady, she ultimately underwent St. Jude Dual Chamber PPM implantation in 10/2020. She continues to have difficult to control rates. Consideration of AV node ablation have been discussed.  ? ?Last seen by Dr Tamala Julian on 05/19/21 and HRs elevated to 136bpm. She was given a dose of Lopressor in the office. She complained of hair loss that she had attributed to metoprolol. She was reluctant to starting digoxin given issues with this in the past. Asprin was discontinued.  ?  ?Today she presents to clinic for follow up.  No CP or SOB. No LE edema, orthopnea or PND. No dizziness or syncope. No blood in stool or urine. No palpitations. She does not feel her heart racing. Did not take meds this am because she hasn't eaten yet.  ? ? ?Past Medical History:  ?Diagnosis Date  ? Anemia   ? Arthritis 07-20-12  ? hands  ? Asthma   ? Back pain   ? Carotid arterial disease ([place])   ? Class 1 obesity with serious comorbidity and body mass index (BMI) of 31.0 to 31.9 in adult 03/26/2018  ? Depression   ? Essential hypertension 03/26/2018  ? GERD (gastroesophageal reflux disease)   ? "comes and goes" - no meds currently  ? Hyperlipidemia   ? Joint pain   ?  Persistent atrial fibrillation ([place])   ? Restless leg syndrome   ? Rheumatoid arthritis ([place])   ? 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 s/p TAVR with a 23 mm Edwards S3U via the TF approach by Dr. Angelena Form & Dr. Roxy Manns  ? Severe aortic stenosis   ? Type 2 diabetes mellitus without complication, without long-term current use of insulin ([place]) 04/10/2018  ? Vitamin D deficiency   ? ? ?Past Surgical History:  ?Procedure Laterality Date  ? BUNIONECTOMY  20 yrs ago  ? bil feet  ? BUNIONECTOMY  11/30/2011  ? Procedure: BUNIONECTOMY;  Surgeon: Magnus Sinning, MD;  Location: [facility];  Service: Orthopedics;  Laterality: Bilateral;  RIGHT FOOT EXCISION OF BUNIONETTE AND PARTIAL   PROXIMAL PHALANGECTOMY OF 5TH TOE ?LEFT FOOT FUNK BUNIONECTOMY,EXCISION OF BUNIONETTE   ? CARDIOVERSION N/A 11/05/2019  ? Procedure: CARDIOVERSION;  Surgeon: Lelon Perla, MD;  Location: [facility] ENDOSCOPY;  Service: Cardiovascular;  Laterality: N/A;  ? CARDIOVERSION N/A 12/05/2019  ? Procedure: CARDIOVERSION;  Surgeon: Elouise Munroe, MD;  Location: [facility] ENDOSCOPY;  Service: Cardiovascular;  Laterality: N/A;  ? COLONOSCOPY WITH PROPOFOL N/A 08/07/2012  ? Procedure: COLONOSCOPY WITH PROPOFOL;  Surgeon: Garlan Fair, MD;  Location: [facility] ENDOSCOPY;  Service: Endoscopy;  Laterality: N/A;  ? MOUTH SURGERY    ? teeth extractions  ? 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  ? Procedure: PACEMAKER IMPLANT;  Surgeon: Constance Haw, MD;  Location: [place] CV LAB;  Service: Cardiovascular;  Laterality: N/A;  ? RIGHT/LEFT HEART CATH AND CORONARY ANGIOGRAPHY N/A 06/17/2020  ? Procedure: RIGHT/LEFT HEART CATH AND CORONARY ANGIOGRAPHY;  Surgeon: Belva Crome, MD;  Location: [place] CV LAB;  Service: Cardiovascular;  Laterality: N/A;  ? TONSILLECTOMY    ? TRANSCATHETER AORTIC VALVE REPLACEMENT, TRANSFEMORAL N/A 06/30/2020  ? Procedure: TRANSCATHETER AORTIC VALVE REPLACEMENT, TRANSFEMORAL;  Surgeon: Burnell Blanks, MD;   Location: [place] CV LAB;  Service: Open Heart Surgery;  Laterality: N/A;  ? TUBAL LIGATION    ? WRIST SURGERY    ? ? ?Current Medications: ?Current Meds  ?Medication Sig  ? albuterol (VENTOLIN HFA) 108 (90 Base) MCG/ACT inhaler Inhale 2 puffs into the lungs every 4 (four) hours as needed for wheezing or shortness of breath.  ? apixaban (ELIQUIS) 5 MG TABS tablet Take 1 tablet (5 mg total) by mouth 2 (two) times daily.  ? cetirizine (ZYRTEC) 10 MG tablet Take 10 mg by mouth daily as needed for allergies.  ? Cyanocobalamin (VITAMIN B-12 PO) Take 1 tablet by mouth daily.  ? diclofenac Sodium (VOLTAREN) 1 % GEL Apply 1 application topically 2 (two) times daily as needed (pain).  ? digoxin (LANOXIN) 0.125 MG tablet Take 1  tablet (0.125 mg total) by mouth daily.  ? diltiazem (CARDIZEM CD) 180 MG 24 hr capsule Take 1 capsule (180 mg total) by mouth in the morning and at bedtime.  ? meclizine (ANTIVERT) 25 MG tablet Take 25 mg by mouth 2 (two) times daily as needed.  ? metFORMIN (GLUCOPHAGE-XR) 750 MG 24 hr tablet Take 750 mg by mouth 2 (two) times daily.  ? metoprolol tartrate (LOPRESSOR) 50 MG tablet Take 1.5 tablets (75 mg total) by mouth 2 (two) times daily.  ? Multiple Vitamins-Minerals (MULTIVITAMIN WITH MINERALS) tablet Take 1 tablet by mouth daily.  ? Naphazoline-Pheniramine (ALLERGY EYE OP) Place 1 drop into both eyes daily as needed (red/itchy eyes).  ? rOPINIRole (REQUIP) 4 MG tablet Take 4 mg by mouth at bedtime.  ? rosuvastatin (CRESTOR) 20 MG tablet Take 1 tablet (20 mg total) by mouth daily in the afternoon.  ?  ? ?Allergies:   Bee venom, Apricot flavor, Atorvastatin, and Wasp venom  ? ?Social History  ? ?Socioeconomic History  ? Marital status: Widowed  ?  Spouse name: Not on file  ? Number of children: 2  ? Years of education: Not on file  ? Highest education level: Not on file  ?Occupational History  ? Occupation: Retired-worked at [employer]  ?Tobacco Use  ? Smoking status: Never  ? Smokeless tobacco:  Never  ?Vaping Use  ? Vaping Use: Never used  ?Substance and Sexual Activity  ? Alcohol use: No  ? Drug use: No  ? Sexual activity: Not Currently  ?Other Topics Concern  ? Not on file  ?Social History Narrative  ?

## 2021-05-28 ENCOUNTER — Ambulatory Visit (HOSPITAL_COMMUNITY): Payer: Medicare Other | Attending: Cardiovascular Disease

## 2021-05-28 ENCOUNTER — Ambulatory Visit: Payer: Medicare Other | Admitting: Physician Assistant

## 2021-05-28 VITALS — BP 120/84 | HR 134 | Ht 68.0 in | Wt 214.0 lb

## 2021-05-28 DIAGNOSIS — Z950 Presence of cardiac pacemaker: Secondary | ICD-10-CM

## 2021-05-28 DIAGNOSIS — I4821 Permanent atrial fibrillation: Secondary | ICD-10-CM

## 2021-05-28 DIAGNOSIS — I779 Disorder of arteries and arterioles, unspecified: Secondary | ICD-10-CM

## 2021-05-28 DIAGNOSIS — I10 Essential (primary) hypertension: Secondary | ICD-10-CM | POA: Diagnosis not present

## 2021-05-28 DIAGNOSIS — Z952 Presence of prosthetic heart valve: Secondary | ICD-10-CM

## 2021-05-28 LAB — ECHOCARDIOGRAM COMPLETE
AR max vel: 1.98 cm2
AV Area VTI: 1.77 cm2
AV Area mean vel: 1.96 cm2
AV Mean grad: 7 mmHg
AV Peak grad: 13.8 mmHg
Ao pk vel: 1.86 m/s
Area-P 1/2: 7.59 cm2
MV VTI: 2.33 cm2
S' Lateral: 2.8 cm

## 2021-05-28 MED ORDER — DIGOXIN 125 MCG PO TABS: 0.1250 mg | ORAL_TABLET | Freq: Every day | ORAL | 3 refills | Status: DC

## 2021-05-28 NOTE — Patient Instructions (Signed)
Medication Instructions:  ?Start Digoxin 0.125 mg daily  ? ?*If you need a refill on your cardiac medications before your next appointment, please call your pharmacy* ? ? ?Lab Work: ?None ordered  ? ?If you have labs (blood work) drawn today and your tests are completely normal, you will receive your results only by: ?MyChart Message (if you have MyChart) OR ?A paper copy in the mail ?If you have any lab test that is abnormal or we need to change your treatment, we will call you to review the results. ? ? ?Testing/Procedures: ?None ordered  ? ? ?Follow-Up: ?Follow up as scheduled  ? ? ?Other Instructions ? ?Important Information About Sugar ? ? ? ? ?  ?

## 2021-06-09 ENCOUNTER — Encounter: Payer: Medicare Other | Admitting: Cardiology

## 2021-06-10 ENCOUNTER — Ambulatory Visit: Payer: Medicare Other | Admitting: Physician Assistant

## 2021-06-11 ENCOUNTER — Ambulatory Visit (INDEPENDENT_AMBULATORY_CARE_PROVIDER_SITE_OTHER): Payer: Medicare Other | Admitting: Physician Assistant

## 2021-06-11 VITALS — BP 120/80 | HR 142 | Ht 68.0 in | Wt 215.0 lb

## 2021-06-11 DIAGNOSIS — I10 Essential (primary) hypertension: Secondary | ICD-10-CM

## 2021-06-11 DIAGNOSIS — I4891 Unspecified atrial fibrillation: Secondary | ICD-10-CM

## 2021-06-11 DIAGNOSIS — I779 Disorder of arteries and arterioles, unspecified: Secondary | ICD-10-CM

## 2021-06-11 DIAGNOSIS — Z952 Presence of prosthetic heart valve: Secondary | ICD-10-CM | POA: Diagnosis not present

## 2021-06-11 DIAGNOSIS — Z950 Presence of cardiac pacemaker: Secondary | ICD-10-CM

## 2021-06-11 MED ORDER — DIGOXIN 125 MCG PO TABS: 0.1250 mg | ORAL_TABLET | Freq: Every day | ORAL | 3 refills | Status: DC

## 2021-06-11 MED ORDER — METOPROLOL TARTRATE 100 MG PO TABS: 100.0000 mg | ORAL_TABLET | Freq: Two times a day (BID) | ORAL | 3 refills | Status: DC

## 2021-06-11 NOTE — Patient Instructions (Addendum)
Medication Instructions:  ?Your physician has recommended you make the following change in your medication:  ? ?1-Increased Metoprolol 100 mg by mouth twice daily ? ?*If you need a refill on your cardiac medications before your next appointment, please call your pharmacy* ? ?Lab Work: ?Your physician recommends that you have lab work today- Digoxin Level ? ?If you have labs (blood work) drawn today and your tests are completely normal, you will receive your results only by: ?MyChart Message (if you have MyChart) OR ?A paper copy in the mail ?If you have any lab test that is abnormal or we need to change your treatment, we will call you to review the results. ? ?Testing/Procedures: ?Your physician has requested that you have a carotid duplex. This test is an ultrasound of the carotid arteries in your neck. It looks at blood flow through these arteries that supply the brain with blood. Allow one hour for this exam. There are no restrictions or special instructions. ? ?Follow-Up: ?At [facility], you and your health needs are our priority.  As part of our continuing mission to provide you with exceptional heart care, we have created designated Provider Care Teams.  These Care Teams include your primary Cardiologist (physician) and Advanced Practice Providers (APPs -  Physician Assistants and Nurse Practitioners) who all work together to provide you with the care you need, when you need it. ? ?We recommend signing up for the patient portal called "MyChart".  Sign up information is provided on this After Visit Summary.  MyChart is used to connect with patients for Virtual Visits (Telemedicine).  Patients are able to view lab/test results, encounter notes, upcoming appointments, etc.  Non-urgent messages can be sent to your provider as well.   ?To learn more about what you can do with MyChart, go to [email or web address].   ? ?Your next appointment:   ?As already scheduled  ? ?Important Information About  Sugar ? ? ? ? ?  ?

## 2021-06-11 NOTE — Progress Notes (Signed)
?[facility]   ?[place] ?                                    ?Cardiology Office Note:   ? ?Date:  06/11/2021  ? ?IDNISHKA Foster, DOB 04-Nov-1945, MRN [id number] ? ?PCP:  Wenda Low, MD  ?[facility] Cardiologist:  Sinclair Grooms, MD / Dr. Angelena Form & Dr. Roxy Manns (TAVR) ?[facility] Electrophysiologist:  Will Meredith Leeds, MD  ? ?Referring MD: Wenda Low, MD  ? ?Follow up of HR after adding digoxin.  ? ?History of Present Illness:   ? ?Briana Foster is a 76 y.o. 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 ? ?She is followed by Dr. Tamala Julian and Dr. Quentin Ore in the outpatient setting for aortic stenosis and afib. She is felt to have permanent atrial fibrillation having failed to maintain NSR on amiodarone. A rate control strategy with metoprolol and long acting diltiazem was recommended. Echo 05/11/20 showed EF 55-60%, mild LVH, moderate MAC with trivial MR, severe LFLG AS with mean gradient 29 mm Hg, peak gradient 31 mm hg, AVA 0.73 cm2, DVI 0.23, SVI 19. Of note, she was in rapid afib during this echo evaluation. She complained of exertional fatigue and dyspnea. She was referred to Dr. Angelena Form for structural heart evaluation and seen on 06/10/20 and noted to have afib with RVR during that visit and started back on oral amio for better rate control. This was later discontinued due to nausea. ?  ?She underwent diagnostic L/RHC on 06/17/20 which showed widely patent coronary arteries (vessels tortuous but with no significant obstruction).  ?  ?She then returned to the hospital on 06/21/20 via EMS for marked fatigue and generalized weakness and found to have afib with RVR. She was admitted from 5/21-5/25/22 for rate  control and underwent TAVR work up and was evaluated by the multidisciplinary valve team. She was seen by EP who felt continued medical therapy was warranted (vs AV node ablation + PPM). Rates eventually controlled with just long acting cardizem and Lopressor. Plans were made for discharge home and to return 06/30/20 for TAVR. ? ?She underwent successful TAVR with a 23 mm Edwards Sapien 3 Ultra THV via the TF approach on 06/30/20. Post operative echo showed EF 55%, normally functioning TAVR with a mean gradient of 11 mmHg and no PVL. She was resume on home AV nodal blocking agents as well as Eliquis and started on a baby aspirin 81 mg daily x 6 months. 1 month echo showed EF 55%, normally functioning TAVR with a mean gradient of 8.4 mmHg and no PVL. Mild MR/MS. Moderate MAC.  ? ?Given ongoing issues with tachy-brady, she ultimately underwent St. Jude Dual Chamber PPM implantation in 10/2020. She continues to have difficult to control rates. Consideration of AV node ablation have been discussed.  ? ?Last seen by Dr Tamala Julian on 05/19/21 and HRs elevated to 136bpm. She was given a dose of Lopressor in the office. She complained of hair loss that she had attributed to metoprolol. She was reluctant to starting digoxin given issues with this in the past. Asprin was discontinued. I saw her on 05/28/21 for TAVR  follow up and HRs were still elevated. I added digoxin. Echo at that visit showed EF 60%, normally functioning TAVR with a mean gradient of 7 mm hg and no PVL.  ? ?Today the patient presents to clinic for follow up. No CP or SOB. No LE edema, orthopnea or PND. No dizziness or syncope. No blood in stool or urine. No palpitations. She does not feel her heart racing at all. She also doesn't want an AV nodal ablation. Wants to know if heart okay to go on a cruise in November.  ? ? ? ?Past Medical History:  ?Diagnosis Date  ? Anemia   ? Arthritis 07-20-12  ? hands  ? Asthma   ? Back pain   ? Carotid arterial disease ([place])   ?  Class 1 obesity with serious comorbidity and body mass index (BMI) of 31.0 to 31.9 in adult 03/26/2018  ? Depression   ? Essential hypertension 03/26/2018  ? GERD (gastroesophageal reflux disease)   ? "comes and goes" - no meds currently  ? Hyperlipidemia   ? Joint pain   ? Persistent atrial fibrillation ([place])   ? Restless leg syndrome   ? Rheumatoid arthritis ([place])   ? 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 s/p TAVR with a 23 mm Edwards S3U via the TF approach by Dr. Angelena Form & Dr. Roxy Manns  ? Severe aortic stenosis   ? Type 2 diabetes mellitus without complication, without long-term current use of insulin ([place]) 04/10/2018  ? Vitamin D deficiency   ? ? ?Past Surgical History:  ?Procedure Laterality Date  ? BUNIONECTOMY  20 yrs ago  ? bil feet  ? BUNIONECTOMY  11/30/2011  ? Procedure: BUNIONECTOMY;  Surgeon: Magnus Sinning, MD;  Location: [facility];  Service: Orthopedics;  Laterality: Bilateral;  RIGHT FOOT EXCISION OF BUNIONETTE AND PARTIAL   PROXIMAL PHALANGECTOMY OF 5TH TOE ?LEFT FOOT FUNK BUNIONECTOMY,EXCISION OF BUNIONETTE   ? CARDIOVERSION N/A 11/05/2019  ? Procedure: CARDIOVERSION;  Surgeon: Lelon Perla, MD;  Location: [facility] ENDOSCOPY;  Service: Cardiovascular;  Laterality: N/A;  ? CARDIOVERSION N/A 12/05/2019  ? Procedure: CARDIOVERSION;  Surgeon: Elouise Munroe, MD;  Location[facility] ENDOSCOPY;  Service: Cardiovascular;  Laterality: N/A;  ? COLONOSCOPY WITH PROPOFOL N/A 08/07/2012  ? Procedure: COLONOSCOPY WITH PROPOFOL;  Surgeon: Garlan Fair, MD;  Location: [facility] ENDOSCOPY;  Service: Endoscopy;  Laterality: N/A;  ? MOUTH SURGERY    ? teeth extractions  ? 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  ? Procedure: PACEMAKER IMPLANT;  Surgeon: Constance Haw, MD;  Location: [place] CV LAB;  Service: Cardiovascular;  Laterality: N/A;  ? RIGHT/LEFT HEART CATH AND CORONARY ANGIOGRAPHY N/A 06/17/2020  ? Procedure: RIGHT/LEFT HEART CATH AND CORONARY ANGIOGRAPHY;  Surgeon: Belva Crome, MD;   Location: [place] CV LAB;  Service: Cardiovascular;  Laterality: N/A;  ? TONSILLECTOMY    ? TRANSCATHETER AORTIC VALVE REPLACEMENT, TRANSFEMORAL N/A 06/30/2020  ? Procedure: TRANSCATHETER AORTIC VALVE REPLACEMENT, TRANSFEMORAL;  Surgeon: Burnell Blanks, MD;  Location: [place] [facility];  Service: Open Heart Surgery;  Laterality: N/A;  ? TUBAL LIGATION    ? WRIST SURGERY    ? ? ?Current Medications: ?Current Meds  ?Medication Sig  ? albuterol (VENTOLIN HFA) 108 (90 Base) MCG/ACT inhaler Inhale 2 puffs into the lungs every 4 (four) hours as needed for wheezing or shortness of breath.  ? apixaban (ELIQUIS) 5 MG TABS tablet Take 1 tablet (5 mg total) by mouth 2 (two) times daily.  ?  cetirizine (ZYRTEC) 10 MG tablet Take 10 mg by mouth daily as needed for allergies.  ? Cyanocobalamin (VITAMIN B-12 PO) Take 1 tablet by mouth daily.  ? diclofenac Sodium (VOLTAREN) 1 % GEL Apply 1 application topically 2 (two) times daily as needed (pain).  ? diltiazem (CARDIZEM CD) 180 MG 24 hr capsule Take 1 capsule (180 mg total) by mouth in the morning and at bedtime.  ? meclizine (ANTIVERT) 25 MG tablet Take 25 mg by mouth 2 (two) times daily as needed.  ? metFORMIN (GLUCOPHAGE-XR) 750 MG 24 hr tablet Take 750 mg by mouth 2 (two) times daily.  ? Multiple Vitamins-Minerals (MULTIVITAMIN WITH MINERALS) tablet Take 1 tablet by mouth daily.  ? Naphazoline-Pheniramine (ALLERGY EYE OP) Place 1 drop into both eyes daily as needed (red/itchy eyes).  ? rOPINIRole (REQUIP) 4 MG tablet Take 4 mg by mouth at bedtime.  ? rosuvastatin (CRESTOR) 20 MG tablet Take 1 tablet (20 mg total) by mouth daily in the afternoon.  ? [DISCONTINUED] digoxin (LANOXIN) 0.125 MG tablet Take 1 tablet (0.125 mg total) by mouth daily.  ? [DISCONTINUED] metoprolol tartrate (LOPRESSOR) 50 MG tablet Take 1.5 tablets (75 mg total) by mouth 2 (two) times daily.  ?  ? ?Allergies:   Bee venom, Apricot flavor, Atorvastatin, and Wasp venom  ? ?Social History   ? ?Socioeconomic History  ? Marital status: Widowed  ?  Spouse name: Not on file  ? Number of children: 2  ? Years of education: Not on file  ? Highest education level: Not on file  ?Occupational History  ? [employer]

## 2021-06-12 LAB — DIGOXIN LEVEL: Digoxin, Serum: 0.8 ng/mL (ref 0.5–0.9)

## 2021-06-18 ENCOUNTER — Other Ambulatory Visit: Payer: Self-pay | Admitting: Interventional Cardiology

## 2021-06-18 DIAGNOSIS — I4819 Other persistent atrial fibrillation: Secondary | ICD-10-CM

## 2021-06-18 NOTE — Telephone Encounter (Signed)
Prescription refill request for Eliquis received. Indication:Afib  Last office visit:06/11/21 Briana Foster)  Scr: 0.96 (10/28/20)  Age: 76 Weight: 97.5kg  Appropriate dose and refill sent to requested pharmacy.

## 2021-06-22 DIAGNOSIS — M545 Low back pain, unspecified: Secondary | ICD-10-CM | POA: Diagnosis not present

## 2021-06-22 DIAGNOSIS — R000 Tachycardia, unspecified: Secondary | ICD-10-CM | POA: Diagnosis not present

## 2021-06-22 DIAGNOSIS — R030 Elevated blood-pressure reading, without diagnosis of hypertension: Secondary | ICD-10-CM | POA: Diagnosis not present

## 2021-06-24 ENCOUNTER — Encounter: Payer: Self-pay | Admitting: *Deleted

## 2021-06-24 ENCOUNTER — Ambulatory Visit: Payer: Medicare Other | Admitting: Cardiology

## 2021-06-24 ENCOUNTER — Encounter: Payer: Self-pay | Admitting: Cardiology

## 2021-06-24 VITALS — BP 150/84 | HR 113 | Ht 67.0 in | Wt 214.4 lb

## 2021-06-24 DIAGNOSIS — Z01812 Encounter for preprocedural laboratory examination: Secondary | ICD-10-CM | POA: Diagnosis not present

## 2021-06-24 DIAGNOSIS — I4821 Permanent atrial fibrillation: Secondary | ICD-10-CM

## 2021-06-24 NOTE — Patient Instructions (Signed)
Medication Instructions:  Your physician recommends that you continue on your current medications as directed. Please refer to the Current Medication list given to you today.  *If you need a refill on your cardiac medications before your next appointment, please call your pharmacy*   Lab Work: Pre procedure labs today: BMET & CBC  If you have labs (blood work) drawn today and your tests are completely normal, you will receive your results only by: MyChart Message (if you have MyChart) OR A paper copy in the mail If you have any lab test that is abnormal or we need to change your treatment, we will call you to review the results.   Testing/Procedures: Your physician has recommended that you have an ablation. Catheter ablation is a medical procedure used to treat some cardiac arrhythmias (irregular heartbeats). During catheter ablation, a long, thin, flexible tube is put into a blood vessel in your groin (upper thigh), or neck. This tube is called an ablation catheter. It is then guided to your heart through the blood vessel. Radio frequency waves destroy small areas of heart tissue where abnormal heartbeats may cause an arrhythmia to start. Please see the instruction sheet given to you today.   Follow-Up: At [facility], you and your health needs are our priority.  As part of our continuing mission to provide you with exceptional heart care, we have created designated Provider Care Teams.  These Care Teams include your primary Cardiologist (physician) and Advanced Practice Providers (APPs -  Physician Assistants and Nurse Practitioners) who all work together to provide you with the care you need, when you need it.   Your next appointment:   2 week(s) with device clinic for wound check after your procedure   The format for your next appointment:   In Person  Provider:   Loman [place], MD{    Thank you for choosing [facility]!!   Dory Horn, RN [phone]  Other  Instructions    Important Information About Sugar       Cardiac Ablation Cardiac ablation is a procedure to destroy (ablate) some heart tissue that is sending bad signals. These bad signals cause problems in heart rhythm. The heart has many areas that make these signals. If there are problems in these areas, they can make the heart beat in a way that is not normal. Destroying some tissues can help make the heart rhythm normal. Tell your doctor about: Any allergies you have. All medicines you are taking. These include vitamins, herbs, eye drops, creams, and over-the-counter medicines. Any problems you or family members have had with medicines that make you fall asleep (anesthetics). Any blood disorders you have. Any surgeries you have had. Any medical conditions you have, such as kidney failure. Whether you are pregnant or may be pregnant. What are the risks? This is a safe procedure. But problems may occur, including: Infection. Bruising and bleeding. Bleeding into the chest. Stroke or blood clots. Damage to nearby areas of your body. Allergies to medicines or dyes. The need for a pacemaker if the normal system is damaged. Failure of the procedure to treat the problem. What happens before the procedure? Medicines Ask your doctor about: Changing or stopping your normal medicines. This is important. Taking aspirin and ibuprofen. Do not take these medicines unless your doctor tells you to take them. Taking other medicines, vitamins, herbs, and supplements. General instructions Follow instructions from your doctor about what you cannot eat or drink. Plan to have someone take you home  from the hospital or clinic. If you will be going home right after the procedure, plan to have someone with you for 24 hours. Ask your doctor what steps will be taken to prevent infection. What happens during the procedure?  An IV tube will be put into one of your veins. You will be given a  medicine to help you relax. The skin on your neck or groin will be numbed. A cut (incision) will be made in your neck or groin. A needle will be put through your cut and into a large vein. A tube (catheter) will be put into the needle. The tube will be moved to your heart. Dye may be put through the tube. This helps your doctor see your heart. Small devices (electrodes) on the tube will send out signals. A type of energy will be used to destroy some heart tissue. The tube will be taken out. Pressure will be held on your cut. This helps stop bleeding. A bandage will be put over your cut. The exact procedure may vary among doctors and hospitals. What happens after the procedure? You will be watched until you leave the hospital or clinic. This includes checking your heart rate, breathing rate, oxygen, and blood pressure. Your cut will be watched for bleeding. You will need to lie still for a few hours. Do not drive for 24 hours or as long as your doctor tells you. Summary Cardiac ablation is a procedure to destroy some heart tissue. This is done to treat heart rhythm problems. Tell your doctor about any medical conditions you may have. Tell him or her about all medicines you are taking to treat them. This is a safe procedure. But problems may occur. These include infection, bruising, bleeding, and damage to nearby areas of your body. Follow what your doctor tells you about food and drink. You may also be told to change or stop some of your medicines. After the procedure, do not drive for 24 hours or as long as your doctor tells you. This information is not intended to replace advice given to you by your health care provider. Make sure you discuss any questions you have with your health care provider. Document Revised: 12/20/2018 Document Reviewed: 12/20/2018 Elsevier Patient Education  2023 [employer].

## 2021-06-24 NOTE — H&P (View-Only) (Signed)
Electrophysiology Office Note   Date:  06/24/2021   ID:  Courntey, [place] Sep 10, 1945, MRN [id number]  PCP:  Wenda Low, MD  Cardiologist:  Tamala Julian Primary Electrophysiologist:  Heydi Swango Meredith Leeds, MD    Chief Complaint: pacemaker   History of Present Illness: Briana Foster is a 76 y.o. female who is being seen today for the evaluation of pacemaker at the request of Wenda Low, MD. Presenting today for electrophysiology evaluation.  She has a history seen for diabetes, hypertension, hyperlipidemia, carotid artery disease, severe aortic stenosis status post TAVR, permanent atrial fibrillation.  She was seen in the hospital with tachybradycardia syndrome.  She is now status post Abbott dual-chamber pacemaker implanted 10/28/2020.  Today, she denies symptoms of palpitations, chest pain, shortness of breath, orthopnea, PND, lower extremity edema, claudication, dizziness, presyncope, syncope, bleeding, or neurologic sequela. The patient is tolerating medications without difficulties.    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 Angelena Form & Dr. Roxy Manns   Severe aortic stenosis    Type 2 diabetes mellitus without complication, without long-term current use of insulin ([place]) 04/10/2018   Vitamin D deficiency    Past Surgical History:  Procedure Laterality Date   BUNIONECTOMY  20 yrs ago   bil feet    BUNIONECTOMY  11/30/2011   Procedure: Lillard Anes;  Surgeon: Magnus Sinning, MD;  Location: [facility];  Service: Orthopedics;  Laterality: Bilateral;  RIGHT FOOT EXCISION OF BUNIONETTE AND PARTIAL   PROXIMAL PHALANGECTOMY OF 5TH TOE LEFT FOOT FUNK BUNIONECTOMY,EXCISION OF BUNIONETTE    CARDIOVERSION N/A 11/05/2019   Procedure: CARDIOVERSION;  Surgeon: Lelon Perla, MD;  Location: [facility] ENDOSCOPY;  Service: Cardiovascular;  Laterality: N/A;   CARDIOVERSION N/A 12/05/2019   Procedure: CARDIOVERSION;  Surgeon: Elouise Munroe, MD;  Location: [place];  Service: Cardiovascular;  Laterality: N/A;   COLONOSCOPY WITH PROPOFOL N/A 08/07/2012   Procedure: COLONOSCOPY WITH PROPOFOL;  Surgeon: Garlan Fair, MD;  [facility] END[facility];  Service: Endoscopy;  Laterality: N/A;   MOUTH SURGERY     teeth extractions   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   Procedure: PACEMAKER IMPLANT;  Surgeon: Constance Haw, MD;  Location: [place] CV LAB;  Service: Cardiovascular;  Laterality: N/A;   RIGHT/LEFT HEART CATH AND CORONARY ANGIOGRAPHY N/A 06/17/2020   Procedure: RIGHT/LEFT HEART CATH AND CORONARY ANGIOGRAPHY;  Surgeon: Belva Crome, MD;  Location: [place] CV LAB;  Service: Cardiovascular;  Laterality: N/A;   TONSILLECTOMY     TRANSCATHETER AORTIC VALVE REPLACEMENT, TRANSFEMORAL N/A 06/30/2020   Procedure: TRANSCATHETER AORTIC VALVE REPLACEMENT, TRANSFEMORAL;  Surgeon: Burnell Blanks, MD;  Location: [place] CV LAB;  Service: Open Heart Surgery;  Laterality: N/A;   TUBAL LIGATION     WRIST SURGERY  Current Outpatient Medications  Medication Sig Dispense Refill   albuterol (VENTOLIN HFA) 108 (90 Base) MCG/ACT inhaler Inhale 2 puffs into the lungs every 4 (four) hours as needed for wheezing or shortness of breath.     apixaban (ELIQUIS) 5 MG TABS tablet Take 1 tablet (5 mg total) by mouth 2 (two) times daily. 60 tablet 5   cetirizine (ZYRTEC) 10 MG tablet Take 10 mg by mouth  daily as needed for allergies.     Cyanocobalamin (VITAMIN B-12 PO) Take 1 tablet by mouth daily.     diclofenac Sodium (VOLTAREN) 1 % GEL Apply 1 application topically 2 (two) times daily as needed (pain).     digoxin (LANOXIN) 0.125 MG tablet Take 1 tablet (0.125 mg total) by mouth daily. 90 tablet 3   diltiazem (CARDIZEM CD) 180 MG 24 hr capsule Take 1 capsule (180 mg total) by mouth in the morning and at bedtime. 180 capsule 3   meclizine (ANTIVERT) 25 MG tablet Take 25 mg by mouth 2 (two) times daily as needed.     metFORMIN (GLUCOPHAGE-XR) 750 MG 24 hr tablet Take 750 mg by mouth 2 (two) times daily.     metoprolol tartrate (LOPRESSOR) 100 MG tablet Take 1 tablet (100 mg total) by mouth 2 (two) times daily. 180 tablet 3   Multiple Vitamins-Minerals (MULTIVITAMIN WITH MINERALS) tablet Take 1 tablet by mouth daily.     Naphazoline-Pheniramine (ALLERGY EYE OP) Place 1 drop into both eyes daily as needed (red/itchy eyes).     rOPINIRole (REQUIP) 4 MG tablet Take 4 mg by mouth at bedtime.     rosuvastatin (CRESTOR) 20 MG tablet Take 1 tablet (20 mg total) by mouth daily in the afternoon. 90 tablet 1   No current facility-administered medications for this visit.    Allergies:   Bee venom, Wasp venom, Apricot flavor, and Atorvastatin   Social History:  The patient  reports that she has never smoked. She has never used smokeless tobacco. She reports that she does not drink alcohol and does not use drugs.   Family History:  The patient's family history includes Cancer in her father; Heart disease in her mother; Stroke in her mother.    ROS:  Please see the history of present illness.   Otherwise, review of systems is positive for none.   All other systems are reviewed and negative.    PHYSICAL EXAM: VS:  BP (!) 150/84   Pulse (!) 113   Ht 5\' 7"  (1.702 m)   Wt 214 lb 6.4 oz (97.3 kg)   SpO2 95%   BMI 33.58 kg/m  , BMI Body mass index is 33.58 kg/m. GEN: Well nourished, well developed,  in no acute distress  HEENT: normal  Neck: no JVD, carotid bruits, or masses Cardiac: irregular, tachycardic; no murmurs, rubs, or gallops,no edema  Respiratory:  clear to auscultation bilaterally, normal work of breathing GI: soft, nontender, nondistended, + BS MS: no deformity or atrophy  Skin: warm and dry, device pocket is well healed Neuro:  Strength and sensation are intact Psych: euthymic mood, full affect  EKG:  EKG is not ordered today. Personal review of the ekg ordered 06/11/21 shows atrial fibrillation, rate 142  Device interrogation is reviewed today in detail.  See PaceArt for details.   Recent Labs: 06/26/2020: ALT 25 07/01/2020: Magnesium 2.0 10/28/2020: BUN 15; Creatinine, Ser 0.96; Hemoglobin 12.4; Platelets 163; Potassium 4.0; Sodium 137    Lipid Panel     Component Value Date/Time  CHOL 138 10/28/2020 0453   TRIG 180 (H) 10/28/2020 0453   HDL 41 10/28/2020 0453   CHOLHDL 3.4 10/28/2020 0453   VLDL 36 10/28/2020 0453   LDLCALC 61 10/28/2020 0453     Wt Readings from Last 3 Encounters:  06/24/21 214 lb 6.4 oz (97.3 kg)  06/11/21 215 lb (97.5 kg)  05/28/21 214 lb (97.1 kg)      Other studies Reviewed: Additional studies/ records that were reviewed today include: TTE 05/28/21  Review of the above records today demonstrates:   1. Left ventricular ejection fraction, by estimation, is 60 to 65%. The  left ventricle has normal function. The left ventricle has no regional  wall motion abnormalities. There is mild asymmetric left ventricular  hypertrophy of the basal-septal segment.  Left ventricular diastolic parameters are indeterminate.   2. Right ventricular systolic function is normal. The right ventricular  size is normal. There is normal pulmonary artery systolic pressure.   3. Left atrial size was severely dilated.   4. The mitral valve is normal in structure. Trivial mitral valve  regurgitation. No evidence of mitral stenosis. Moderate mitral  annular  calcification.   5. The aortic valve has been repaired/replaced. Aortic valve  regurgitation is not visualized. No aortic stenosis is present. There is a  23 mm Edwards Sapien prosthetic (TAVR) valve present in the aortic  position. Procedure Date: 06/30/2020. Echo findings   are consistent with normal structure and function of the aortic valve  prosthesis. Aortic valve area, by VTI measures 1.77 cm. Aortic valve mean  gradient measures 7.0 mmHg. Aortic valve Vmax measures 1.86 m/s.   6. The inferior vena cava is dilated in size with >50% respiratory  variability, suggesting right atrial pressure of 8 mmHg.    ASSESSMENT AND PLAN:  1.  Tachybradycardia syndrome: Status post Abbott dual-chamber pacemaker.  Device functioning appropriately.  No changes at this time.  2.  Permanent atrial fibrillation: CHA2DS2-VASc of 5.  Currently on Eliquis 5 mg twice daily.  Also on diltiazem 180 mg twice daily.  Fortunately she is continue to have atrial fibrillation that has been rapid.  She is on maximal medical therapy.  If she is not able to be controlled with medicines, we Briana Foster plan for AV node ablation.  Risk and benefits were discussed.  Risk include bleeding, tamponade, stroke.  She understands these risks and is agreed to the procedure.  3.  Peripheral vascular disease: Significant carotid artery disease.  Plan per primary cardiology.  4.  Aortic stenosis: Status post TAVR May 2022.  Plan per primary cardiology.  5.  Secondary hypercoagulable state: Currently on Eliquis for atrial fibrillation as above.  Current medicines are reviewed at length with the patient today.   The patient does not have concerns regarding her medicines.  The following changes were made today:  none  Labs/ tests ordered today include:  Orders Placed This Encounter  Procedures   Basic metabolic panel   CBC     Disposition:   FU with Briana Foster 3 months  Signed, Delvonte Berenson Meredith Leeds, MD  06/24/2021  3:21 PM     [place]  [id number] [phone] (office) [phone] (fax)

## 2021-06-24 NOTE — Progress Notes (Signed)
 Electrophysiology Office Note   Date:  06/24/2021   ID:  Briana Foster, DOB 10/24/1945, MRN [id number]  PCP:  Husain, Karrar, MD  Cardiologist:  Smith Primary Electrophysiologist:  Darnise Montag Martin Rudie Sermons, MD    Chief Complaint: pacemaker   History of Present Illness: Briana Foster is a 75 y.o. female who is being seen today for the evaluation of pacemaker at the request of Husain, Karrar, MD. Presenting today for electrophysiology evaluation.  She has a history seen for diabetes, hypertension, hyperlipidemia, carotid artery disease, severe aortic stenosis status post TAVR, permanent atrial fibrillation.  She was seen in the hospital with tachybradycardia syndrome.  She is now status post Abbott dual-chamber pacemaker implanted 10/28/2020.  Today, she denies symptoms of palpitations, chest pain, shortness of breath, orthopnea, PND, lower extremity edema, claudication, dizziness, presyncope, syncope, bleeding, or neurologic sequela. The patient is tolerating medications without difficulties.    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 McAlhany & Dr. Owen   Severe aortic stenosis    Type 2 diabetes mellitus without complication, without long-term current use of insulin (HCC) 04/10/2018   Vitamin D deficiency    Past Surgical History:  Procedure Laterality Date   BUNIONECTOMY  20 yrs ago   bil feet    BUNIONECTOMY  11/30/2011   Procedure: BUNIONECTOMY;  Surgeon: James P Aplington, MD;  Location: [facility];  Service: Orthopedics;  Laterality: Bilateral;  RIGHT FOOT EXCISION OF BUNIONETTE AND PARTIAL   PROXIMAL PHALANGECTOMY OF 5TH TOE LEFT FOOT FUNK BUNIONECTOMY,EXCISION OF BUNIONETTE    CARDIOVERSION N/A 11/05/2019   Procedure: CARDIOVERSION;  Surgeon: Crenshaw, Brian S, MD;  Location: [facility];  Service: Cardiovascular;  Laterality: N/A;   CARDIOVERSION N/A 12/05/2019   Procedure: CARDIOVERSION;  Surgeon: Acharya, Gayatri A, MD;  Location: [facility];  Service: Cardiovascular;  Laterality: N/A;   COLONOSCOPY WITH PROPOFOL N/A 08/07/2012   Procedure: COLONOSCOPY WITH PROPOFOL;  Surgeon: Martin K Johnson, MD;  Location: [facility];  Service: Endoscopy;  Laterality: N/A;   MOUTH SURGERY     teeth extractions   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   Procedure: PACEMAKER IMPLANT;  Surgeon: Jennifier Smitherman Martin, MD;  Location: [facility];  Service: Cardiovascular;  Laterality: N/A;   RIGHT/LEFT HEART CATH AND CORONARY ANGIOGRAPHY N/A 06/17/2020   Procedure: RIGHT/LEFT HEART CATH AND CORONARY ANGIOGRAPHY;  Surgeon: Smith, Henry W, MD;  Location: [facility];  Service: Cardiovascular;  Laterality: N/A;   TONSILLECTOMY     TRANSCATHETER AORTIC VALVE REPLACEMENT, TRANSFEMORAL N/A 06/30/2020   Procedure: TRANSCATHETER AORTIC VALVE REPLACEMENT, TRANSFEMORAL;  Surgeon: McAlhany, Christopher D, MD;  Location: [facility];  Service: Open Heart Surgery;  Laterality: N/A;   TUBAL LIGATION     WRIST SURGERY         Current Outpatient Medications  Medication Sig Dispense Refill   albuterol (VENTOLIN HFA) 108 (90 Base) MCG/ACT inhaler Inhale 2 puffs into the lungs every 4 (four) hours as needed for wheezing or shortness of breath.     apixaban (ELIQUIS) 5 MG TABS tablet Take 1 tablet (5 mg total) by mouth 2 (two) times daily. 60 tablet 5   cetirizine (ZYRTEC) 10 MG tablet Take 10 mg by mouth  daily as needed for allergies.     Cyanocobalamin (VITAMIN B-12 PO) Take 1 tablet by mouth daily.     diclofenac Sodium (VOLTAREN) 1 % GEL Apply 1 application topically 2 (two) times daily as needed (pain).     digoxin (LANOXIN) 0.125 MG tablet Take 1 tablet (0.125 mg total) by mouth daily. 90 tablet 3   diltiazem (CARDIZEM CD) 180 MG 24 hr capsule Take 1 capsule (180 mg total) by mouth in the morning and at bedtime. 180 capsule 3   meclizine (ANTIVERT) 25 MG tablet Take 25 mg by mouth 2 (two) times daily as needed.     metFORMIN (GLUCOPHAGE-XR) 750 MG 24 hr tablet Take 750 mg by mouth 2 (two) times daily.     metoprolol tartrate (LOPRESSOR) 100 MG tablet Take 1 tablet (100 mg total) by mouth 2 (two) times daily. 180 tablet 3   Multiple Vitamins-Minerals (MULTIVITAMIN WITH MINERALS) tablet Take 1 tablet by mouth daily.     Naphazoline-Pheniramine (ALLERGY EYE OP) Place 1 drop into both eyes daily as needed (red/itchy eyes).     rOPINIRole (REQUIP) 4 MG tablet Take 4 mg by mouth at bedtime.     rosuvastatin (CRESTOR) 20 MG tablet Take 1 tablet (20 mg total) by mouth daily in the afternoon. 90 tablet 1   No current facility-administered medications for this visit.    Allergies:   Bee venom, Wasp venom, Apricot flavor, and Atorvastatin   Social History:  The patient  reports that she has never smoked. She has never used smokeless tobacco. She reports that she does not drink alcohol and does not use drugs.   Family History:  The patient's family history includes Cancer in her father; Heart disease in her mother; Stroke in her mother.    ROS:  Please see the history of present illness.   Otherwise, review of systems is positive for none.   All other systems are reviewed and negative.    PHYSICAL EXAM: VS:  BP (!) 150/84   Pulse (!) 113   Ht 5' 7" (1.702 m)   Wt 214 lb 6.4 oz (97.3 kg)   SpO2 95%   BMI 33.58 kg/m  , BMI Body mass index is 33.58 kg/m. GEN: Well nourished, well developed,  in no acute distress  HEENT: normal  Neck: no JVD, carotid bruits, or masses Cardiac: irregular, tachycardic; no murmurs, rubs, or gallops,no edema  Respiratory:  clear to auscultation bilaterally, normal work of breathing GI: soft, nontender, nondistended, + BS MS: no deformity or atrophy  Skin: warm and dry, device pocket is well healed Neuro:  Strength and sensation are intact Psych: euthymic mood, full affect  EKG:  EKG is not ordered today. Personal review of the ekg ordered 06/11/21 shows atrial fibrillation, rate 142  Device interrogation is reviewed today in detail.  See PaceArt for details.   Recent Labs: 06/26/2020: ALT 25 07/01/2020: Magnesium 2.0 10/28/2020: BUN 15; Creatinine, Ser 0.96; Hemoglobin 12.4; Platelets 163; Potassium 4.0; Sodium 137    Lipid Panel     Component Value Date/Time     CHOL 138 10/28/2020 0453   TRIG 180 (H) 10/28/2020 0453   HDL 41 10/28/2020 0453   CHOLHDL 3.4 10/28/2020 0453   VLDL 36 10/28/2020 0453   LDLCALC 61 10/28/2020 0453     Wt Readings from Last 3 Encounters:  06/24/21 214 lb 6.4 oz (97.3 kg)  06/11/21 215 lb (97.5 kg)  05/28/21 214 lb (97.1 kg)      Other studies Reviewed: Additional studies/ records that were reviewed today include: TTE 05/28/21  Review of the above records today demonstrates:   1. Left ventricular ejection fraction, by estimation, is 60 to 65%. The  left ventricle has normal function. The left ventricle has no regional  wall motion abnormalities. There is mild asymmetric left ventricular  hypertrophy of the basal-septal segment.  Left ventricular diastolic parameters are indeterminate.   2. Right ventricular systolic function is normal. The right ventricular  size is normal. There is normal pulmonary artery systolic pressure.   3. Left atrial size was severely dilated.   4. The mitral valve is normal in structure. Trivial mitral valve  regurgitation. No evidence of mitral stenosis. Moderate mitral  annular  calcification.   5. The aortic valve has been repaired/replaced. Aortic valve  regurgitation is not visualized. No aortic stenosis is present. There is a  23 mm Edwards Sapien prosthetic (TAVR) valve present in the aortic  position. Procedure Date: 06/30/2020. Echo findings   are consistent with normal structure and function of the aortic valve  prosthesis. Aortic valve area, by VTI measures 1.77 cm. Aortic valve mean  gradient measures 7.0 mmHg. Aortic valve Vmax measures 1.86 m/s.   6. The inferior vena cava is dilated in size with >50% respiratory  variability, suggesting right atrial pressure of 8 mmHg.    ASSESSMENT AND PLAN:  1.  Tachybradycardia syndrome: Status post Abbott dual-chamber pacemaker.  Device functioning appropriately.  No changes at this time.  2.  Permanent atrial fibrillation: CHA2DS2-VASc of 5.  Currently on Eliquis 5 mg twice daily.  Also on diltiazem 180 mg twice daily.  Fortunately she is continue to have atrial fibrillation that has been rapid.  She is on maximal medical therapy.  If she is not able to be controlled with medicines, we Allie Gerhold plan for AV node ablation.  Risk and benefits were discussed.  Risk include bleeding, tamponade, stroke.  She understands these risks and is agreed to the procedure.  3.  Peripheral vascular disease: Significant carotid artery disease.  Plan per primary cardiology.  4.  Aortic stenosis: Status post TAVR May 2022.  Plan per primary cardiology.  5.  Secondary hypercoagulable state: Currently on Eliquis for atrial fibrillation as above.  Current medicines are reviewed at length with the patient today.   The patient does not have concerns regarding her medicines.  The following changes were made today:  none  Labs/ tests ordered today include:  Orders Placed This Encounter  Procedures   Basic metabolic panel   CBC     Disposition:   FU with Lanetra Hartley 3 months  Signed, Kealy Lewter Martin Salik Grewell, MD  06/24/2021  3:21 PM     [facility] [address] [place] [address] [phone] (office) [phone] (fax)  

## 2021-06-25 LAB — CBC
Hematocrit: 45.8 % (ref 34.0–46.6)
Hemoglobin: 15.3 g/dL (ref 11.1–15.9)
MCH: 30.7 pg (ref 26.6–33.0)
MCHC: 33.4 g/dL (ref 31.5–35.7)
MCV: 92 fL (ref 79–97)
Platelets: 198 10*3/uL (ref 150–450)
RBC: 4.98 x10E6/uL (ref 3.77–5.28)
RDW: 12.3 % (ref 11.7–15.4)
WBC: 7.9 10*3/uL (ref 3.4–10.8)

## 2021-06-25 LAB — BASIC METABOLIC PANEL
BUN/Creatinine Ratio: 17 (ref 12–28)
BUN: 13 mg/dL (ref 8–27)
CO2: 23 mmol/L (ref 20–29)
Calcium: 10 mg/dL (ref 8.7–10.3)
Chloride: 102 mmol/L (ref 96–106)
Creatinine, Ser: 0.76 mg/dL (ref 0.57–1.00)
Glucose: 225 mg/dL — ABNORMAL HIGH (ref 70–99)
Potassium: 4.6 mmol/L (ref 3.5–5.2)
Sodium: 139 mmol/L (ref 134–144)
eGFR: 83 mL/min/{1.73_m2} (ref >59)

## 2021-06-29 ENCOUNTER — Inpatient Hospital Stay (HOSPITAL_COMMUNITY): Admission: RE | Admit: 2021-06-29 | Payer: Medicare Other | Source: Ambulatory Visit

## 2021-07-09 ENCOUNTER — Ambulatory Visit (HOSPITAL_COMMUNITY)
Admission: RE | Admit: 2021-07-09 | Discharge: 2021-07-09 | Disposition: A | Discharge status: Home / Self Care | Payer: Medicare Other | Source: Ambulatory Visit | Attending: Cardiology | Admitting: Cardiology

## 2021-07-09 DIAGNOSIS — I779 Disorder of arteries and arterioles, unspecified: Secondary | ICD-10-CM

## 2021-07-13 NOTE — Pre-Procedure Instructions (Signed)
Instructed patient on the following items: Arrival time 1030 Nothing to eat or drink after midnight No meds AM of procedure Responsible person to drive you home and stay with you for 24 hrs  Have you missed any doses of anti-coagulant Eliquis- take both doses today, none in the morning.

## 2021-07-14 ENCOUNTER — Other Ambulatory Visit: Payer: Self-pay

## 2021-07-14 ENCOUNTER — Encounter (HOSPITAL_COMMUNITY)
Admission: RE | Disposition: A | Discharge status: Home / Self Care | Payer: Self-pay | Source: Home / Self Care | Attending: Cardiology

## 2021-07-14 ENCOUNTER — Ambulatory Visit (HOSPITAL_COMMUNITY)
Admission: RE | Admit: 2021-07-14 | Discharge: 2021-07-14 | Disposition: A | Discharge status: Home / Self Care | Payer: Medicare Other | Attending: Cardiology | Admitting: Cardiology

## 2021-07-14 DIAGNOSIS — Z79899 Other long term (current) drug therapy: Secondary | ICD-10-CM | POA: Diagnosis not present

## 2021-07-14 DIAGNOSIS — I4891 Unspecified atrial fibrillation: Secondary | ICD-10-CM

## 2021-07-14 DIAGNOSIS — Z952 Presence of prosthetic heart valve: Secondary | ICD-10-CM | POA: Diagnosis not present

## 2021-07-14 DIAGNOSIS — I10 Essential (primary) hypertension: Secondary | ICD-10-CM | POA: Insufficient documentation

## 2021-07-14 DIAGNOSIS — Z7984 Long term (current) use of oral hypoglycemic drugs: Secondary | ICD-10-CM | POA: Diagnosis not present

## 2021-07-14 DIAGNOSIS — E785 Hyperlipidemia, unspecified: Secondary | ICD-10-CM | POA: Diagnosis not present

## 2021-07-14 DIAGNOSIS — Z950 Presence of cardiac pacemaker: Secondary | ICD-10-CM | POA: Insufficient documentation

## 2021-07-14 DIAGNOSIS — I6529 Occlusion and stenosis of unspecified carotid artery: Secondary | ICD-10-CM | POA: Diagnosis not present

## 2021-07-14 DIAGNOSIS — D6869 Other thrombophilia: Secondary | ICD-10-CM | POA: Diagnosis not present

## 2021-07-14 DIAGNOSIS — I495 Sick sinus syndrome: Secondary | ICD-10-CM | POA: Insufficient documentation

## 2021-07-14 DIAGNOSIS — Z7901 Long term (current) use of anticoagulants: Secondary | ICD-10-CM | POA: Insufficient documentation

## 2021-07-14 DIAGNOSIS — E1151 Type 2 diabetes mellitus with diabetic peripheral angiopathy without gangrene: Secondary | ICD-10-CM | POA: Insufficient documentation

## 2021-07-14 DIAGNOSIS — I4821 Permanent atrial fibrillation: Secondary | ICD-10-CM | POA: Diagnosis not present

## 2021-07-14 HISTORY — PX: AV NODE ABLATION: EP1193

## 2021-07-14 LAB — GLUCOSE, CAPILLARY: Glucose-Capillary: 187 mg/dL — ABNORMAL HIGH (ref 70–99)

## 2021-07-14 SURGERY — AV NODE ABLATION
Anesthesia: LOCAL

## 2021-07-14 MED ORDER — MIDAZOLAM HCL 5 MG/5ML IJ SOLN
INTRAMUSCULAR | Status: DC | PRN
  Administered 2021-07-14 (×2): 1 mg via INTRAVENOUS

## 2021-07-14 MED ORDER — HEPARIN (PORCINE) IN NACL 1000-0.9 UT/500ML-% IV SOLN
INTRAVENOUS | Status: AC
  Filled 2021-07-14: qty 500

## 2021-07-14 MED ORDER — FENTANYL CITRATE (PF) 100 MCG/2ML IJ SOLN
INTRAMUSCULAR | Status: DC | PRN
Start: 2021-07-14 — End: 2021-07-14
  Administered 2021-07-14 (×2): 25 ug via INTRAVENOUS

## 2021-07-14 MED ORDER — SODIUM CHLORIDE 0.9% FLUSH: 3.0000 mL | INTRAVENOUS | Status: DC | PRN

## 2021-07-14 MED ORDER — ACETAMINOPHEN 325 MG PO TABS: 650.0000 mg | ORAL_TABLET | ORAL | Status: DC | PRN

## 2021-07-14 MED ORDER — BUPIVACAINE HCL (PF) 0.25 % IJ SOLN
INTRAMUSCULAR | Status: DC | PRN
Start: 2021-07-14 — End: 2021-07-14
  Administered 2021-07-14: 30 mL

## 2021-07-14 MED ORDER — ONDANSETRON HCL 4 MG/2ML IJ SOLN: 4.0000 mg | Freq: Four times a day (QID) | INTRAMUSCULAR | Status: DC | PRN

## 2021-07-14 MED ORDER — MIDAZOLAM HCL 5 MG/5ML IJ SOLN
INTRAMUSCULAR | Status: AC
  Filled 2021-07-14: qty 5

## 2021-07-14 MED ORDER — FENTANYL CITRATE (PF) 100 MCG/2ML IJ SOLN
INTRAMUSCULAR | Status: AC
  Filled 2021-07-14: qty 2

## 2021-07-14 MED ORDER — SODIUM CHLORIDE 0.9 % IV SOLN: 250.0000 mL | INTRAVENOUS | Status: DC | PRN

## 2021-07-14 MED ORDER — HEPARIN (PORCINE) IN NACL 1000-0.9 UT/500ML-% IV SOLN
INTRAVENOUS | Status: DC | PRN
  Administered 2021-07-14: 500 mL

## 2021-07-14 MED ORDER — BUPIVACAINE HCL (PF) 0.25 % IJ SOLN
INTRAMUSCULAR | Status: AC
  Filled 2021-07-14: qty 30

## 2021-07-14 MED ORDER — SODIUM CHLORIDE 0.9 % IV SOLN: INTRAVENOUS | Status: DC

## 2021-07-14 SURGICAL SUPPLY — 6 items
CATH BLAZERPRIME XP LG CV 10MM (ABLATOR) ×1 IMPLANT
CLOSURE PERCLOSE PROSTYLE (VASCULAR PRODUCTS) ×1 IMPLANT
PACK EP LATEX FREE (CUSTOM PROCEDURE TRAY) ×2
PACK EP LF (CUSTOM PROCEDURE TRAY) ×1 IMPLANT
PAD DEFIB RADIO PHYSIO CONN (PAD) ×2 IMPLANT
SHEATH PINNACLE 8F 10CM (SHEATH) ×1 IMPLANT

## 2021-07-14 NOTE — Discharge Instructions (Signed)

## 2021-07-14 NOTE — Interval H&P Note (Signed)
History and Physical Interval Note:  07/14/2021 12:26 PM  Briana Foster  has presented today for surgery, with the diagnosis of permanent a-fib.  The various methods of treatment have been discussed with the patient and family. After consideration of risks, benefits and other options for treatment, the patient has consented to  Procedure(s): AV NODE ABLATION (N/A) as a surgical intervention.  The patient's history has been reviewed, patient examined, no change in status, stable for surgery.  I have reviewed the patient's chart and labs.  Questions were answered to the patient's satisfaction.     [name] [employer]

## 2021-07-15 ENCOUNTER — Encounter (HOSPITAL_COMMUNITY): Payer: Self-pay | Admitting: Cardiology

## 2021-07-16 IMAGING — CT CT ANGIO CHEST-ABD-PELV FOR DISSECTION W/ AND WO/W CM
2 of 7 series · 13 of 46 positions shown, 15 images · non-contrast
Comparison: None.

CLINICAL DATA: Abdominal pain, aortic dissection suspected.
weakness and a-fib. heart cath performed [REDACTED].

EXAM:
CT ANGIOGRAPHY CHEST, ABDOMEN AND PELVIS
TECHNIQUE: Non-contrast CT of the chest was initially obtained.

[Series 7: dissection 2mm · axial · 0.76mm/px · z∈[-594,-62]mm · 10 of 305 slices shown, 12 images]
[im 20/305  soft-tissue]
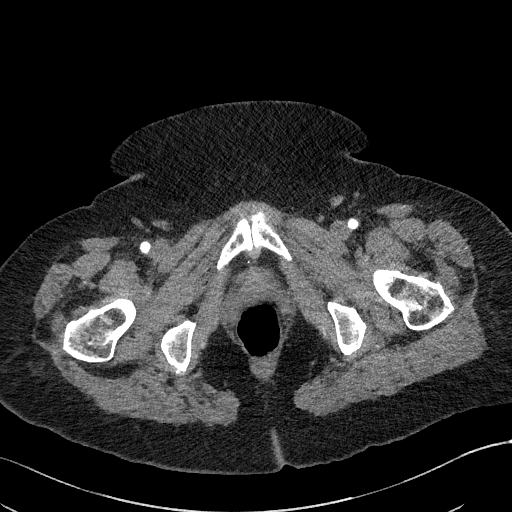
[im 20/305  bone]
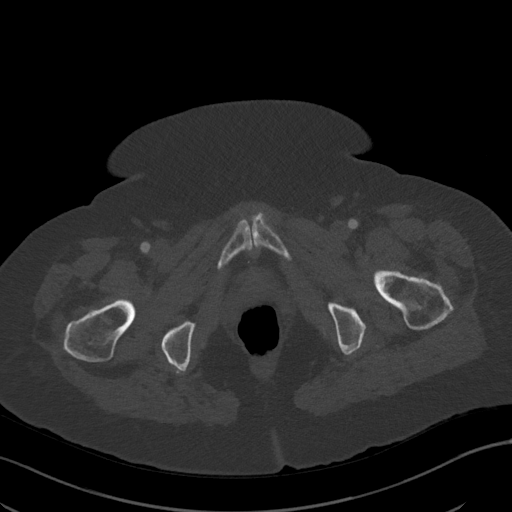
[im 58/305  soft-tissue]
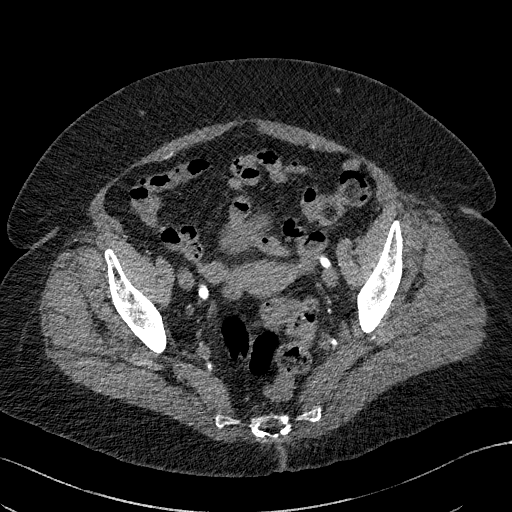
[im 77/305  soft-tissue]
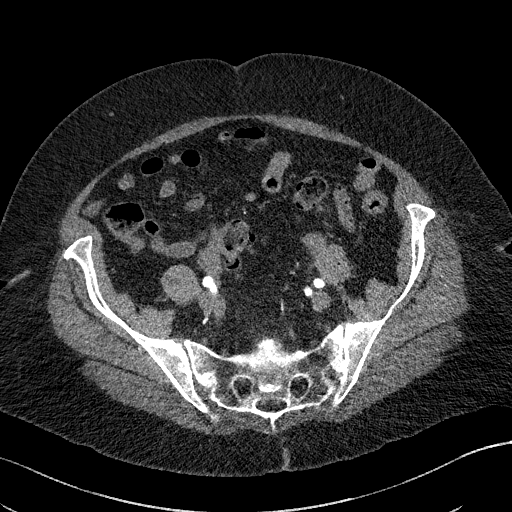
[im 115/305  soft-tissue]
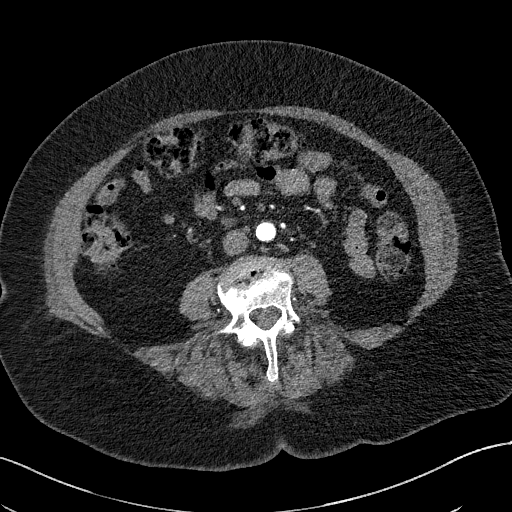
[im 134/305  soft-tissue]
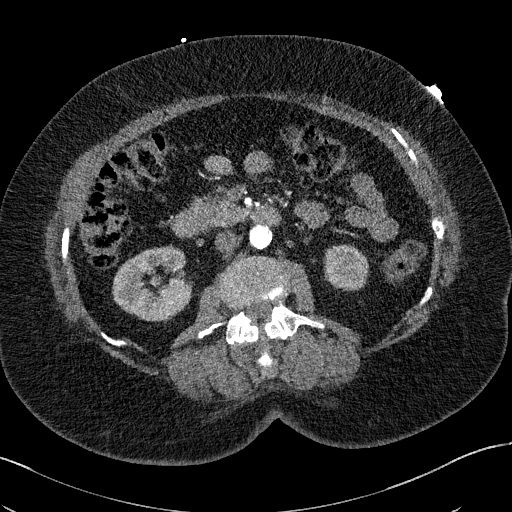
[im 172/305  soft-tissue]
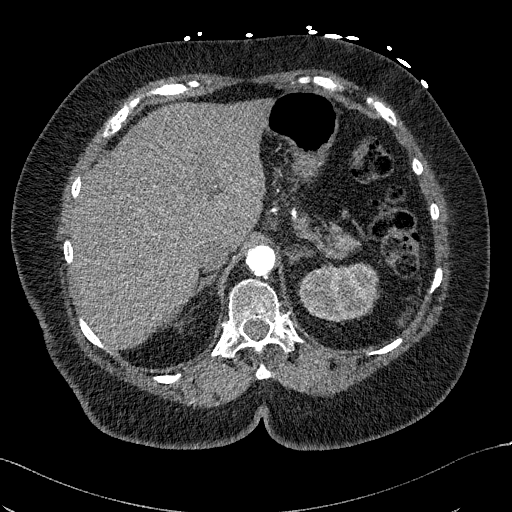
[im 191/305  soft-tissue]
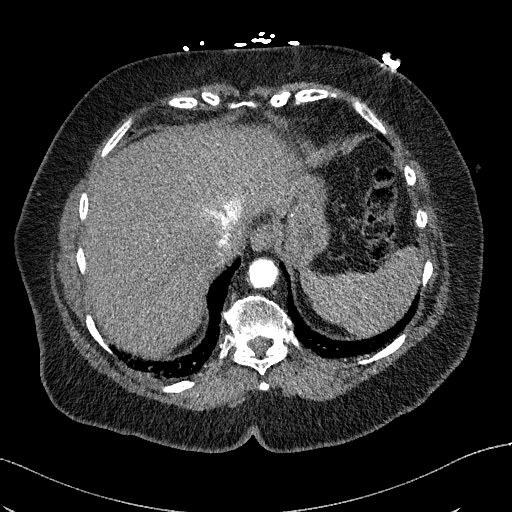
[im 229/305  soft-tissue]
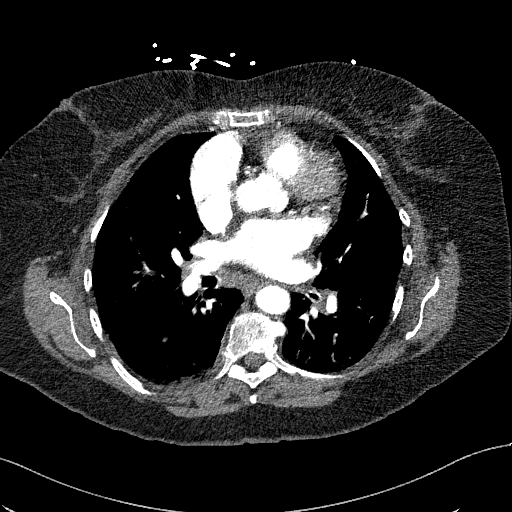
[im 248/305  soft-tissue]
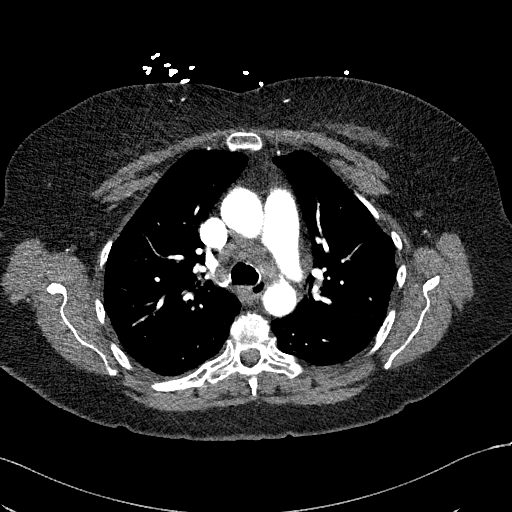
[im 248/305  bone]
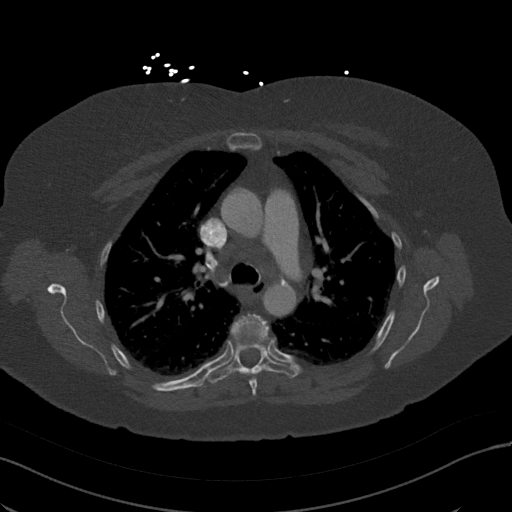
[im 286/305  soft-tissue]
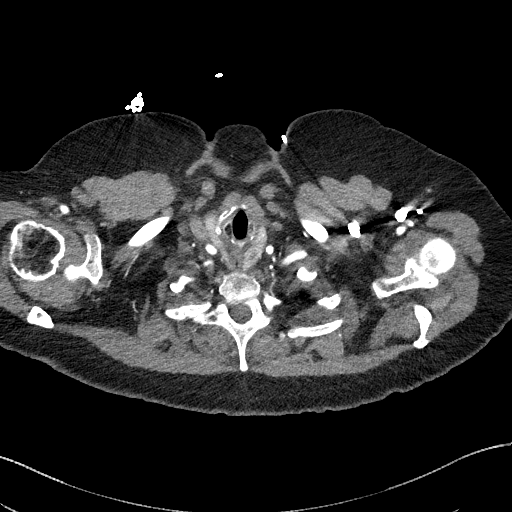

[Series 10: dissection 2mm cor · coronal · 0.92mm/px · 3 of 189 slices shown]
[im 48/189  soft-tissue]
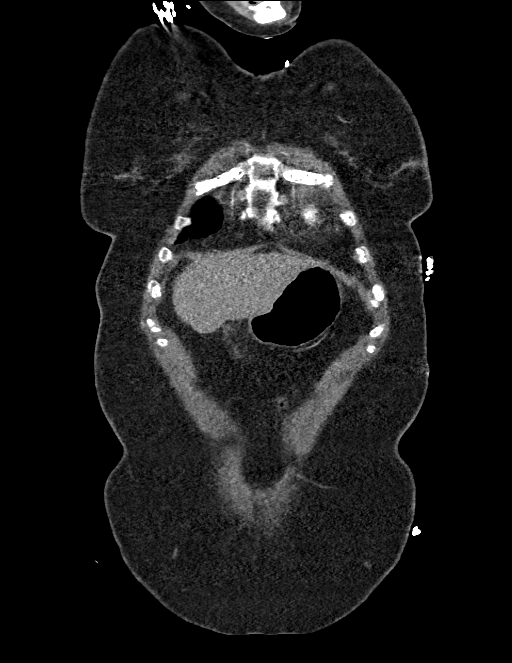
[im 95/189  soft-tissue]
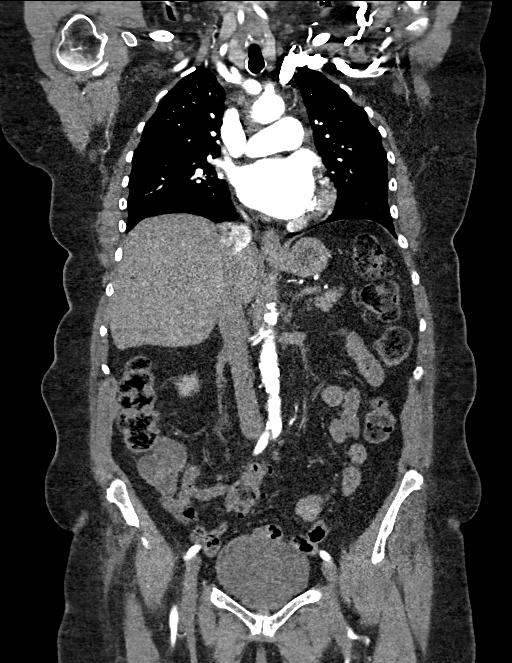
[im 142/189  soft-tissue]
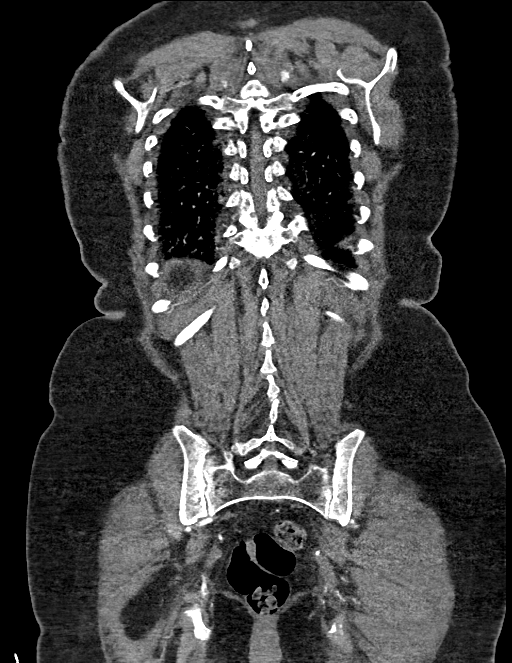

[13 of 46 positions shown; findings below may reference images not displayed]

Multidetector CT imaging through the chest, abdomen and pelvis was
performed using the standard protocol during bolus administration of
intravenous contrast. Multiplanar reconstructed images and MIPs were
obtained and reviewed to evaluate the vascular anatomy.

CONTRAST:  100mL OMNIPAQUE IOHEXOL 350 MG/ML SOLN
FINDINGS: CTA CHEST FINDINGS

Cardiovascular:

Satisfactory opacification of the pulmonary arteries to the
segmental level. No evidence of pulmonary embolism.

Satisfactory opacification of the aorta with no aneurysm or
dissection.

Mild cardiomegaly. Question patent foramen ovale. No pericardial
effusion. Four-vessel coronary artery calcifications. Mitral annular
calcifications at least moderate.

Mediastinum/Nodes: Multiple borderline enlarged mediastinal lymph
nodes: 0.9 cm right paratracheal ([DATE]). Prominent left hilar lymph
nodes. Likely enlarged right hilar lymph node: 1.3 cm ([DATE]). No
enlarged axillary lymph nodes. Thyroid gland, trachea, and esophagus
demonstrate no significant findings.

Lungs/Pleura: Bilateral lower lobe subsegmental atelectasis. Few
scattered calcified and noncalcified pulmonary micronodules ([DATE],
[place]). No pulmonary mass. No focal consolidation. No pleural
effusion. No pneumothorax. Likely arterio venous malformation in the
right lower lobe at the right base (41-6:43). Diffuse bronchial wall
thickening.

Musculoskeletal:

No chest wall abnormality.

No suspicious lytic or blastic osseous lesions. No acute displaced
fracture. Multilevel degenerative changes of the spine. Degenerative
changes of bilateral shoulders.

Review of the MIP images confirms the above findings.

CTA ABDOMEN AND PELVIS FINDINGS

VASCULAR

Aorta: At least moderate atherosclerotic plaque. Normal caliber
aorta without aneurysm, dissection, vasculitis or significant
stenosis.

Celiac: At least moderate stenosis of the origin of the celiac
artery due to atherosclerotic plaque. Patent without evidence of
aneurysm, dissection, vasculitis or significant stenosis.

SMA: Patent without evidence of aneurysm, dissection, vasculitis or
significant stenosis.

Renals: Atherosclerotic plaque. Both renal arteries are patent
without evidence of aneurysm, dissection, vasculitis, fibromuscular
dysplasia or significant stenosis.

IMA: Patent without evidence of aneurysm, dissection, vasculitis or
significant stenosis.

Inflow: Patent without evidence of aneurysm, dissection, vasculitis
or significant stenosis.

Veins: No obvious venous abnormality within the limitations of this
arterial phase study.

Review of the MIP images confirms the above findings.

NON-VASCULAR

Hepatobiliary: No focal liver abnormality. No gallstones,
gallbladder wall thickening, or pericholecystic fluid. No biliary
dilatation.

Pancreas: No focal lesion. Normal pancreatic contour. No surrounding
inflammatory changes. No main pancreatic ductal dilatation.

Spleen: Normal in size without focal abnormality.

Adrenals/Urinary Tract:

No adrenal nodule bilaterally.

Bilateral kidneys enhance symmetrically. No hydronephrosis. No
hydroureter.

The urinary bladder is unremarkable.

Stomach/Bowel: Stomach is within normal limits. No evidence of bowel
wall thickening or dilatation. Scattered colonic diverticulosis.
Appendix appears normal.

Lymphatic: No lymphadenopathy.

Reproductive: Uterus and bilateral adnexa are unremarkable.

Other: No intraperitoneal free fluid. No intraperitoneal free gas.
No organized fluid collection.

Musculoskeletal:

No abdominal wall hernia or abnormality.

No suspicious lytic or blastic osseous lesions. No acute displaced
fracture. Multilevel degenerative changes of the spine.

Review of the MIP images confirms the above findings.
IMPRESSION: 1. No acute aortic abnormality. Aortic Atherosclerosis (ZLCX1-787.7)
including four-vessel coronary artery calcifications and moderate
mitral annular calcifications.
2. No central or segmental pulmonary embolus.
3. Nonspecific right hilar lymphadenopathy and prominent left
hilar/mediastinal lymph nodes. Findings may be reactive in etiology.
Recommend attention on follow-up.
4. Likely arteriovenous malformation in the right lower lobe at the
right base. Correlation with prior cross-sectional imaging would be
of value. Recommend attention on follow-up.
5. Mild cardiomegaly with question of patent foramen ovale.
6. Scattered colonic diverticulosis with no acute diverticulitis.

## 2021-08-11 ENCOUNTER — Encounter: Payer: Self-pay | Admitting: Cardiology

## 2021-08-11 ENCOUNTER — Ambulatory Visit: Payer: Medicare Other | Admitting: Cardiology

## 2021-08-11 VITALS — BP 112/70 | HR 90 | Ht 67.5 in | Wt 213.0 lb

## 2021-08-11 DIAGNOSIS — I4821 Permanent atrial fibrillation: Secondary | ICD-10-CM | POA: Diagnosis not present

## 2021-08-11 DIAGNOSIS — D6869 Other thrombophilia: Secondary | ICD-10-CM

## 2021-08-11 NOTE — Patient Instructions (Signed)
Medication Instructions:  Your physician recommends that you continue on your current medications as directed. Please refer to the Current Medication list given to you today.  *If you need a refill on your cardiac medications before your next appointment, please call your pharmacy*   Lab Work: None ordered   Testing/Procedures: None ordered   Follow-Up: At [facility], you and your health needs are our priority.  As part of our continuing mission to provide you with exceptional heart care, we have created designated Provider Care Teams.  These Care Teams include your primary Cardiologist (physician) and Advanced Practice Providers (APPs -  Physician Assistants and Nurse Practitioners) who all work together to provide you with the care you need, when you need it.  Remote monitoring is used to monitor your Pacemaker or ICD from home. This monitoring reduces the number of office visits required to check your device to one time per year. It allows [place] to keep an eye on the functioning of your device to ensure it is working properly. You are scheduled for a device check from home on 10/27/21. You may send your transmission at any time that day. If you have a wireless device, the transmission will be sent automatically. After your physician reviews your transmission, you will receive a postcard with your next transmission date.  Your next appointment:   1 year(s)  The format for your next appointment:   In Person  Provider:   You will see one of the following Advanced Practice Providers on your designated Care Team:   Francis Dowse, [place] Casimiro Needle "Mardelle Matte" Lanna Poche, [place]     Thank you for choosing [facility]!!   Dory Horn, RN [phone]    Other Instructions   Important Information About Sugar

## 2021-08-11 NOTE — Progress Notes (Signed)
Electrophysiology Office Note   Date:  08/11/2021   ID:  Briana, Foster Oct 15, 1945, MRN [id number]  PCP:  Georgann Housekeeper, MD  Cardiologist:  Katrinka Blazing Primary Electrophysiologist:  Chanette Demo Jorja Loa, MD    Chief Complaint: pacemaker   History of Present Illness: Briana Foster is a 76 y.o. female who is being seen today for the evaluation of pacemaker at the request of Georgann Housekeeper, MD. Presenting today for electrophysiology evaluation.  She has a history seen for diabetes, hypertension, hyperlipidemia, carotid artery disease, severe aortic stenosis status post TAVR, permanent atrial fibrillation.  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  She is status post AV node ablation 07/14/2021.  Today, denies symptoms of palpitations, chest pain, shortness of breath, orthopnea, PND, lower extremity edema, claudication, dizziness, presyncope, syncope, bleeding, or neurologic sequela. The patient is tolerating medications without difficulties.  Her ablation she is overall done well.  She has less shortness of breath and fatigue.  She does continue to have some episodes of both of these, but they are improved.  She does state that she is less steady on her feet, but otherwise has been doing well.    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 Clifton James & Dr. Cornelius Moras   Severe aortic stenosis    Type 2 diabetes mellitus without complication, without long-term current use of insulin (HCC) 04/10/2018   Vitamin D deficiency    Past Surgical History:  Procedure Laterality Date   AV NODE ABLATION N/A 07/14/2021   Procedure: AV NODE ABLATION;  Surgeon: Regan Lemming, MD;  Location: [facility];  Service: Cardiovascular;  Laterality: N/A;   BUNIONECTOMY  20 yrs ago   bil feet   BUNIONECTOMY  11/30/2011   Procedure: BUNIONECTOMY;  Surgeon: Drucilla Schmidt, MD;  Location: [facility];  Service: Orthopedics;  Laterality: Bilateral;  RIGHT FOOT EXCISION OF BUNIONETTE AND PARTIAL   PROXIMAL PHALANGECTOMY OF 5TH TOE LEFT FOOT FUNK BUNIONECTOMY,EXCISION OF BUNIONETTE    CARDIOVERSION N/A 11/05/2019   Procedure: CARDIOVERSION;  Surgeon: Lewayne Bunting, MD;  Location: [facility] ENDOSCOPY;  Service: Cardiovascular;  Laterality: N/A;   CARDIOVERSION N/A 12/05/2019   Procedure: CARDIOVERSION;  Surgeon: Parke Poisson, MD;  Location: [facility] END[facility]COPY;  Service: Cardiovascular;  Laterality: N/A;   COLONOSCOPY WITH PROPOFOL N/A 08/07/2012   Procedure: COLONOSCOPY WITH PROPOFOL;  Surgeon: Charolett Bumpers, MD;  Location: [facility] ENDOSCOPY;  Service: Endoscopy;  Laterality: N/A;   MOUTH SURGERY     teeth extractions   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   Procedure: PACEMAKER IMPLANT;  Surgeon: Regan Lemming, MD;  Location: [facility];  Service: Cardiovascular;  Laterality: N/A;   RIGHT/LEFT HEART CATH AND CORONARY ANGIOGRAPHY  N/A 06/17/2020   Procedure: RIGHT/LEFT HEART CATH AND CORONARY ANGIOGRAPHY;  Surgeon: Lyn Records, MD;  Location: [facility] INVASIVE CV LAB;  Service: Cardiovascular;  Laterality: N/A;   TONSILLECTOMY     TRANSCATHETER AORTIC VALVE REPLACEMENT, TRANSFEMORAL N/A 06/30/2020   Procedure: TRANSCATHETER AORTIC VALVE REPLACEMENT, TRANSFEMORAL;  Surgeon: Kathleene Hazel, MD;  Location: [facility];  Service: Open  Heart Surgery;  Laterality: N/A;   TUBAL LIGATION     WRIST SURGERY       Current Outpatient Medications  Medication Sig Dispense Refill   acetaminophen (TYLENOL) 500 MG tablet Take 500-1,000 mg by mouth every 8 (eight) hours as needed for moderate pain.     albuterol (VENTOLIN HFA) 108 (90 Base) MCG/ACT inhaler Inhale 2 puffs into the lungs every 4 (four) hours as needed for wheezing or shortness of breath.     apixaban (ELIQUIS) 5 MG TABS tablet Take 1 tablet (5 mg total) by mouth 2 (two) times daily. 60 tablet 5   cetirizine (ZYRTEC) 10 MG tablet Take 10 mg by mouth daily as needed for allergies.     Cyanocobalamin (VITAMIN B-12 PO) Take 1 tablet by mouth daily.     digoxin (LANOXIN) 0.125 MG tablet Take 1 tablet (0.125 mg total) by mouth daily. 90 tablet 3   meclizine (ANTIVERT) 25 MG tablet Take 25 mg by mouth 2 (two) times daily as needed (vertigo).     metFORMIN (GLUCOPHAGE-XR) 750 MG 24 hr tablet Take 750 mg by mouth daily.     metoprolol tartrate (LOPRESSOR) 100 MG tablet Take 1 tablet (100 mg total) by mouth 2 (two) times daily. 180 tablet 3   Multiple Vitamins-Minerals (MULTIVITAMIN WITH MINERALS) tablet Take 1 tablet by mouth daily.     Naphazoline-Pheniramine (ALLERGY EYE OP) Place 1 drop into both eyes daily as needed (red/itchy eyes).     rOPINIRole (REQUIP) 4 MG tablet Take 4 mg by mouth at bedtime.     rosuvastatin (CRESTOR) 20 MG tablet Take 1 tablet (20 mg total) by mouth daily in the afternoon. 90 tablet 1   No current facility-administered medications for this visit.    Allergies:   Bee venom, Wasp venom, Apricot flavor, and Atorvastatin   Social History:  The patient  reports that she has never smoked. She has never used smokeless tobacco. She reports that she does not drink alcohol and does not use drugs.   Family History:  The patient's family history includes Cancer in her father; Heart disease in her mother; Stroke in her mother.   ROS:  Please see the history  of present illness.   Otherwise, review of systems is positive for none.   All other systems are reviewed and negative.   PHYSICAL EXAM: VS:  BP 112/70   Pulse 90   Ht 5' 7.5" (1.715 m)   Wt 213 lb (96.6 kg)   SpO2 95%   BMI 32.87 kg/m  , BMI Body mass index is 32.87 kg/m. GEN: Well nourished, well developed, in no acute distress  HEENT: normal  Neck: no JVD, carotid bruits, or masses Cardiac: RRR; no murmurs, rubs, or gallops,no edema  Respiratory:  clear to auscultation bilaterally, normal work of breathing GI: soft, nontender, nondistended, + BS MS: no deformity or atrophy  Skin: warm and dry, device site well healed Neuro:  Strength and sensation are intact Psych: euthymic mood, full affect  EKG:  EKG is ordered today. Personal review of the ekg ordered shows atrial fibrillation, ventricular  paced  Personal review of the device interrogation today. Results in [facility]    Recent Labs: 06/24/2021: BUN 13; Creatinine, Ser 0.76; Hemoglobin 15.3; Platelets 198; Potassium 4.6; Sodium 139    Lipid Panel     Component Value Date/Time   CHOL 138 10/28/2020 0453   TRIG 180 (H) 10/28/2020 0453   HDL 41 10/28/2020 0453   CHOLHDL 3.4 10/28/2020 0453   VLDL 36 10/28/2020 0453   LDLCALC 61 10/28/2020 0453     Wt Readings from Last 3 Encounters:  08/11/21 213 lb (96.6 kg)  07/14/21 190 lb (86.2 kg)  06/24/21 214 lb 6.4 oz (97.3 kg)      Other studies Reviewed: Additional studies/ records that were reviewed today include: TTE 05/28/21  Review of the above records today demonstrates:   1. Left ventricular ejection fraction, by estimation, is 60 to 65%. The  left ventricle has normal function. The left ventricle has no regional  wall motion abnormalities. There is mild asymmetric left ventricular  hypertrophy of the basal-septal segment.  Left ventricular diastolic parameters are indeterminate.   2. Right ventricular systolic function is normal. The right ventricular  size  is normal. There is normal pulmonary artery systolic pressure.   3. Left atrial size was severely dilated.   4. The mitral valve is normal in structure. Trivial mitral valve  regurgitation. No evidence of mitral stenosis. Moderate mitral annular  calcification.   5. The aortic valve has been repaired/replaced. Aortic valve  regurgitation is not visualized. No aortic stenosis is present. There is a  23 mm Edwards Sapien prosthetic (TAVR) valve present in the aortic  position. Procedure Date: 06/30/2020. Echo findings   are consistent with normal structure and function of the aortic valve  prosthesis. Aortic valve area, by VTI measures 1.77 cm. Aortic valve mean  gradient measures 7.0 mmHg. Aortic valve Vmax measures 1.86 m/s.   6. The inferior vena cava is dilated in size with >50% respiratory  variability, suggesting right atrial pressure of 8 mmHg.    ASSESSMENT AND PLAN:  1.  Tachybradycardia syndrome: Status post Abbott dual-chamber pacemaker.  Device function appropriately.  No changes at this time.  2.  Permanent atrial fibrillation: CHA2DS2-VASc of 5.  Currently on Eliquis 5 mg twice daily.  Is status post AV node ablation 07/14/2021.  She is now device dependent.  We Buford Gayler turn her heart rate down to VVI 60.  3.  Peripheral arterial disease: Significant carotid artery disease.  Plan per primary cardiology.  4.  Aortic stenosis: Status post TAVR.  Plan per primary cardiology.  5.  Secondary hypercoagulable state: Currently on Eliquis for atrial fibrillation as above.   Current medicines are reviewed at length with the patient today.   The patient does not have concerns regarding her medicines.  The following changes were made today: None  Labs/ tests ordered today include:  Orders Placed This Encounter  Procedures   EKG 12-Lead     Disposition:   FU 12 months  Signed, Dorrene Bently Jorja Loa, MD  08/11/2021 11:41 AM     [facility] [address] [place] [address] [phone] (office) [phone] (fax)

## 2021-08-13 ENCOUNTER — Telehealth: Payer: Self-pay | Admitting: Cardiology

## 2021-08-13 NOTE — Telephone Encounter (Signed)
Left message to call back  

## 2021-08-13 NOTE — Telephone Encounter (Signed)
STAT if HR is under 50 or over 120 (normal HR is 60-100 beats per minute)  What is your heart rate? 60  Do you have a log of your heart rate readings (document readings)?  63 58 60  Do you have any other symptoms? Pt states that she has been feeling very fatigue since pacemaker install yesterday.  She states she is not sure if the pacemaker is just getting adjusted or if she should be concerned.

## 2021-08-13 NOTE — Telephone Encounter (Signed)
Patient is returning call.  °

## 2021-08-13 NOTE — Telephone Encounter (Signed)
Pt is: Dependent at 30  V pacing 99% Originally set at 90 Dropped her to 60 VVR 60  She reports that she is just extremely tired.   Will forward to Dr. Elberta Fortis for advisement, if need to bring pt back in to office to increase lower rate limit as she was recently dropped from 90 to 60, post AVN ablation, at [facility] on 7/12  Pt aware I will call her next week after advisement.

## 2021-08-17 NOTE — Telephone Encounter (Signed)
Also note sensor was programmed on and hysteresis rate changed.

## 2021-08-23 ENCOUNTER — Ambulatory Visit (INDEPENDENT_AMBULATORY_CARE_PROVIDER_SITE_OTHER): Payer: Medicare Other

## 2021-08-23 ENCOUNTER — Ambulatory Visit: Payer: Medicare Other | Admitting: Cardiology

## 2021-08-23 DIAGNOSIS — I4821 Permanent atrial fibrillation: Secondary | ICD-10-CM

## 2021-08-23 NOTE — Telephone Encounter (Signed)
Successful telephone encounter to patient to follow up on recent complaint of feeling tired after PPM rate decreased from 90 to 60 s/p AVN ablation. Patient states today she is feeling much better. C/O intermittent dizziness and sleepiness. BP 176/93, HR 60-71 currently. History of vertigo. Patient advised to change positions slowly to prevent postural hypotension. Discussed with Dr. Elberta Fortis who agrees to continue to monitor since patient is feeling better. Assisted patient with sending manual transmission as once has not been received since ablation.

## 2021-08-24 LAB — CUP PACEART REMOTE DEVICE CHECK
Battery Remaining Longevity: 138 mo
Battery Remaining Percentage: 95.5 %
Battery Voltage: 3.04 V
Brady Statistic RV Percent Paced: 98 %
Date Time Interrogation Session: 20230724143329
Implantable Lead Implant Date: 20220928
Implantable Lead Location: 753860
Implantable Pulse Generator Implant Date: 20220928
Lead Channel Impedance Value: 480 Ohm
Lead Channel Pacing Threshold Amplitude: 0.5 V
Lead Channel Pacing Threshold Pulse Width: 0.4 ms
Lead Channel Sensing Intrinsic Amplitude: 8.6 mV
Lead Channel Setting Pacing Amplitude: 0.75 V
Lead Channel Setting Pacing Pulse Width: 0.4 ms
Lead Channel Setting Sensing Sensitivity: 2 mV
Pulse Gen Model: 1272
Pulse Gen Serial Number: 3937998

## 2021-08-31 ENCOUNTER — Ambulatory Visit
Admission: RE | Admit: 2021-08-31 | Discharge: 2021-08-31 | Disposition: A | Discharge status: Home / Self Care | Payer: Medicare Other | Source: Ambulatory Visit | Attending: Internal Medicine | Admitting: Internal Medicine

## 2021-08-31 ENCOUNTER — Other Ambulatory Visit: Payer: Self-pay | Admitting: Internal Medicine

## 2021-08-31 DIAGNOSIS — I4891 Unspecified atrial fibrillation: Secondary | ICD-10-CM | POA: Diagnosis not present

## 2021-08-31 DIAGNOSIS — R2689 Other abnormalities of gait and mobility: Secondary | ICD-10-CM | POA: Diagnosis not present

## 2021-08-31 DIAGNOSIS — R269 Unspecified abnormalities of gait and mobility: Secondary | ICD-10-CM | POA: Diagnosis not present

## 2021-08-31 DIAGNOSIS — D6869 Other thrombophilia: Secondary | ICD-10-CM | POA: Diagnosis not present

## 2021-08-31 DIAGNOSIS — I10 Essential (primary) hypertension: Secondary | ICD-10-CM | POA: Diagnosis not present

## 2021-08-31 DIAGNOSIS — R2681 Unsteadiness on feet: Secondary | ICD-10-CM | POA: Diagnosis not present

## 2021-08-31 DIAGNOSIS — J45909 Unspecified asthma, uncomplicated: Secondary | ICD-10-CM | POA: Diagnosis not present

## 2021-08-31 DIAGNOSIS — G2581 Restless legs syndrome: Secondary | ICD-10-CM | POA: Diagnosis not present

## 2021-08-31 DIAGNOSIS — E1169 Type 2 diabetes mellitus with other specified complication: Secondary | ICD-10-CM | POA: Diagnosis not present

## 2021-08-31 DIAGNOSIS — Z950 Presence of cardiac pacemaker: Secondary | ICD-10-CM | POA: Diagnosis not present

## 2021-09-21 NOTE — Progress Notes (Signed)
Remote pacemaker transmission.   

## 2021-11-03 ENCOUNTER — Telehealth: Payer: Self-pay | Admitting: Cardiology

## 2021-11-03 ENCOUNTER — Encounter: Payer: Self-pay | Admitting: Nurse Practitioner

## 2021-11-03 ENCOUNTER — Ambulatory Visit: Payer: Medicare Other | Attending: Nurse Practitioner | Admitting: Nurse Practitioner

## 2021-11-03 VITALS — BP 162/82 | HR 65 | Ht 67.5 in | Wt 206.2 lb

## 2021-11-03 DIAGNOSIS — I4821 Permanent atrial fibrillation: Secondary | ICD-10-CM

## 2021-11-03 DIAGNOSIS — Z952 Presence of prosthetic heart valve: Secondary | ICD-10-CM

## 2021-11-03 DIAGNOSIS — R079 Chest pain, unspecified: Secondary | ICD-10-CM | POA: Diagnosis not present

## 2021-11-03 DIAGNOSIS — I10 Essential (primary) hypertension: Secondary | ICD-10-CM

## 2021-11-03 DIAGNOSIS — Z950 Presence of cardiac pacemaker: Secondary | ICD-10-CM | POA: Diagnosis not present

## 2021-11-03 MED ORDER — AMLODIPINE BESYLATE 10 MG PO TABS: 10.0000 mg | ORAL_TABLET | Freq: Every day | ORAL | 3 refills | Status: DC

## 2021-11-03 NOTE — Telephone Encounter (Signed)
Patient calling in to say that she has had pain around her pacemaker. She states the pain comes and goes. Please advise

## 2021-11-03 NOTE — Telephone Encounter (Signed)
Pt called [facility] c/o pain around her Pacemaker site. Pt stated her pain is intermittent.  Pt had her SJ PPM placed on 10/28/2021 and is a dual chamber.  She also is s/p AV Node Ablation 07/14/2021.     When I called her Pt stated she has had pain around her SJ PPM site for 1.5 weeks now.  Pt stated it comes and goes, but is noticeable. When asked, Pt stated no visual abnormalities to skin or redness where PPM is located, no fluid build up at [facility] site, it just feels uncomfortable at times?  Pt also stated that she sometimes will feel off balance when ambulating.  It does not occur all the time, and does not feel any near syncope.   Just off balance.   While speaking with Pt, she stated PCP put her on Amlodipine?  Amlodipine was NOT listed on the Pt MAR.  There may be an underlying BP concern, and be connected to her feeling "off balanced" when ambulating.    I scheduled this Pt to see provider Ambrose Pancoast, NP at 245 pm on 11/03/2021.   Mr. Barbarann Ehlers NP was briefed on her concerns.  Follow today is required.
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
## 2021-11-03 NOTE — Patient Instructions (Signed)
Medication Instructions:  Increase Amlodipine to 10 mg daily   *If you need a refill on your cardiac medications before your next appointment, please call your pharmacy*   Lab Work:  If you have labs (blood work) drawn today and your tests are completely normal, you will receive your results only by: [place] (if you have MyChart) OR A paper copy in the mail If you have any lab test that is abnormal or we need to change your treatment, we will call you to review the results.   Testing/Procedures:    Follow-Up: At [facility], you and your health needs are our priority.  As part of our continuing mission to provide you with exceptional heart care, we have created designated Provider Care Teams.  These Care Teams include your primary Cardiologist (physician) and Advanced Practice Providers (APPs -  Physician Assistants and Nurse Practitioners) who all work together to provide you with the care you need, when you need it.  We recommend signing up for the patient portal called "MyChart".  Sign up information is provided on this After Visit Summary.  MyChart is used to connect with patients for Virtual Visits (Telemedicine).  Patients are able to view lab/test results, encounter notes, upcoming appointments, etc.  Non-urgent messages can be sent to your provider as well.   To learn more about what you can do with MyChart, go to [email or web address].    Your next appointment:  Change April Dr Tamala Julian appt to Dr Ali Lowe   Important Information About Sugar      How to Take Your Blood Pressure Blood pressure measures how strongly your blood is pressing against the walls of your arteries. Arteries are blood vessels that carry blood from your heart throughout your body. You can take your blood pressure at home with a machine. You may need to check your blood pressure at home: To check if you have high blood pressure (hypertension). To check your blood pressure over  time. To make sure your blood pressure medicine is working. Supplies needed: Blood pressure machine, or monitor. A chair to sit in. This should be a chair where you can sit upright with your back supported. Do not sit on a soft couch or an armchair. Table or desk. Small notebook. Pencil or pen. How to prepare Avoid these things for 30 minutes before checking your blood pressure: Having drinks with caffeine in them, such as coffee or tea. Drinking alcohol. Eating. Smoking. Exercising. Do these things five minutes before checking your blood pressure: Go to the bathroom and pee (urinate). Sit in a chair. Be quiet. Do not talk. How to take your blood pressure Follow the instructions that came with your machine. If you have a digital blood pressure monitor, these may be the instructions: Sit up straight. Place your feet on the floor. Do not cross your ankles or legs. Rest your left arm at the level of your heart. You may rest it on a table, desk, or chair. Pull up your shirt sleeve. Wrap the blood pressure cuff around the upper part of your left arm. The cuff should be 1 inch (2.5 cm) above your elbow. It is best to wrap the cuff around bare skin. Fit the cuff snugly around your arm, but not too tightly. You should be able to place only one finger between the cuff and your arm. Place the cord so that it rests in the bend of your elbow. Press the power button. Sit quietly while the cuff fills with  air and loses air. Write down the numbers on the screen. Wait 2-3 minutes and then repeat steps 1-10. What do the numbers mean? Two numbers make up your blood pressure. The first number is called systolic pressure. The second is called diastolic pressure. An example of a blood pressure reading is "120 over 80" (or 120/80). If you are an adult and do not have a medical condition, use this guide to find out if your blood pressure is normal: Normal First number: below 120. Second number: below  80. Elevated First number: 120-129. Second number: below 80. Hypertension stage 1 First number: 130-139. Second number: 80-89. Hypertension stage 2 First number: 140 or above. Second number: 90 or above. Your blood pressure is above normal even if only the first or only the second number is above normal. Follow these instructions at home: Medicines Take over-the-counter and prescription medicines only as told by your doctor. Tell your doctor if your medicine is causing side effects. General instructions Check your blood pressure as often as your doctor tells you to. Check your blood pressure at the same time every day. Take your monitor to your next doctor's appointment. Your doctor will: Make sure you are using it correctly. Make sure it is working right. Understand what your blood pressure numbers should be. Keep all follow-up visits. General tips You will need a blood pressure machine or monitor. Your doctor can suggest a monitor. You can buy one at a drugstore or online. When choosing one: Choose one with an arm cuff. Choose one that wraps around your upper arm. Only one finger should fit between your arm and the cuff. Do not choose one that measures your blood pressure from your wrist or finger. Where to find more information American Heart Association: www.heart.org Contact a doctor if: Your blood pressure keeps being high. Your blood pressure is suddenly low. Get help right away if: Your first blood pressure number is higher than 180. Your second blood pressure number is higher than 120. These symptoms may be an emergency. Do not wait to see if the symptoms will go away. Get help right away. Call 911. Summary Check your blood pressure at the same time every day. Avoid caffeine, alcohol, smoking, and exercise for 30 minutes before checking your blood pressure. Make sure you understand what your blood pressure numbers should be. This information is not intended to  replace advice given to you by your health care provider. Make sure you discuss any questions you have with your health care provider. Document Revised: 10/01/2020 Document Reviewed: 10/01/2020 Elsevier Patient Education  2023 [employer].

## 2021-11-04 ENCOUNTER — Observation Stay (HOSPITAL_COMMUNITY)
Admission: EM | Admit: 2021-11-04 | Discharge: 2021-11-06 | Disposition: A | Discharge status: Home / Self Care | Payer: Medicare Other | Attending: Family Medicine | Admitting: Family Medicine

## 2021-11-04 ENCOUNTER — Emergency Department (HOSPITAL_COMMUNITY): Payer: Medicare Other

## 2021-11-04 ENCOUNTER — Other Ambulatory Visit: Payer: Self-pay

## 2021-11-04 DIAGNOSIS — R1031 Right lower quadrant pain: Principal | ICD-10-CM | POA: Diagnosis present

## 2021-11-04 DIAGNOSIS — S29011A Strain of muscle and tendon of front wall of thorax, initial encounter: Secondary | ICD-10-CM

## 2021-11-04 DIAGNOSIS — K760 Fatty (change of) liver, not elsewhere classified: Secondary | ICD-10-CM | POA: Diagnosis not present

## 2021-11-04 DIAGNOSIS — Z952 Presence of prosthetic heart valve: Secondary | ICD-10-CM | POA: Diagnosis not present

## 2021-11-04 DIAGNOSIS — K838 Other specified diseases of biliary tract: Secondary | ICD-10-CM | POA: Diagnosis not present

## 2021-11-04 DIAGNOSIS — J45909 Unspecified asthma, uncomplicated: Secondary | ICD-10-CM | POA: Diagnosis not present

## 2021-11-04 DIAGNOSIS — I4819 Other persistent atrial fibrillation: Secondary | ICD-10-CM | POA: Diagnosis present

## 2021-11-04 DIAGNOSIS — E119 Type 2 diabetes mellitus without complications: Secondary | ICD-10-CM | POA: Diagnosis not present

## 2021-11-04 DIAGNOSIS — R109 Unspecified abdominal pain: Secondary | ICD-10-CM | POA: Diagnosis present

## 2021-11-04 DIAGNOSIS — R1111 Vomiting without nausea: Secondary | ICD-10-CM | POA: Diagnosis not present

## 2021-11-04 DIAGNOSIS — E785 Hyperlipidemia, unspecified: Secondary | ICD-10-CM

## 2021-11-04 DIAGNOSIS — K810 Acute cholecystitis: Secondary | ICD-10-CM | POA: Diagnosis not present

## 2021-11-04 DIAGNOSIS — K819 Cholecystitis, unspecified: Secondary | ICD-10-CM

## 2021-11-04 DIAGNOSIS — I2510 Atherosclerotic heart disease of native coronary artery without angina pectoris: Secondary | ICD-10-CM | POA: Diagnosis not present

## 2021-11-04 DIAGNOSIS — I480 Paroxysmal atrial fibrillation: Secondary | ICD-10-CM | POA: Insufficient documentation

## 2021-11-04 DIAGNOSIS — I4892 Unspecified atrial flutter: Secondary | ICD-10-CM | POA: Diagnosis not present

## 2021-11-04 DIAGNOSIS — Z79899 Other long term (current) drug therapy: Secondary | ICD-10-CM | POA: Insufficient documentation

## 2021-11-04 DIAGNOSIS — I10 Essential (primary) hypertension: Secondary | ICD-10-CM | POA: Diagnosis present

## 2021-11-04 DIAGNOSIS — R1011 Right upper quadrant pain: Secondary | ICD-10-CM | POA: Diagnosis not present

## 2021-11-04 DIAGNOSIS — Z7901 Long term (current) use of anticoagulants: Secondary | ICD-10-CM | POA: Insufficient documentation

## 2021-11-04 DIAGNOSIS — Z950 Presence of cardiac pacemaker: Secondary | ICD-10-CM | POA: Diagnosis not present

## 2021-11-04 DIAGNOSIS — I779 Disorder of arteries and arterioles, unspecified: Secondary | ICD-10-CM | POA: Diagnosis present

## 2021-11-04 DIAGNOSIS — R1084 Generalized abdominal pain: Secondary | ICD-10-CM | POA: Diagnosis not present

## 2021-11-04 DIAGNOSIS — Z743 Need for continuous supervision: Secondary | ICD-10-CM | POA: Diagnosis not present

## 2021-11-04 DIAGNOSIS — Z7984 Long term (current) use of oral hypoglycemic drugs: Secondary | ICD-10-CM | POA: Insufficient documentation

## 2021-11-04 DIAGNOSIS — R6889 Other general symptoms and signs: Secondary | ICD-10-CM | POA: Diagnosis not present

## 2021-11-04 DIAGNOSIS — I700 Atherosclerosis of aorta: Secondary | ICD-10-CM | POA: Diagnosis not present

## 2021-11-04 DIAGNOSIS — M069 Rheumatoid arthritis, unspecified: Secondary | ICD-10-CM | POA: Diagnosis present

## 2021-11-04 DIAGNOSIS — E1165 Type 2 diabetes mellitus with hyperglycemia: Secondary | ICD-10-CM | POA: Diagnosis not present

## 2021-11-04 LAB — URINALYSIS, ROUTINE W REFLEX MICROSCOPIC
Bacteria, UA: NONE SEEN
Bilirubin Urine: NEGATIVE
Glucose, UA: >=500 mg/dL — AB
Ketones, ur: NEGATIVE mg/dL
Leukocytes,Ua: NEGATIVE
Nitrite: NEGATIVE
Protein, ur: 100 mg/dL — AB
RBC / HPF: >50 RBC/hpf — ABNORMAL HIGH (ref 0–5)
Specific Gravity, Urine: 1.014 (ref 1.005–1.030)
pH: 5 (ref 5.0–8.0)

## 2021-11-04 LAB — COMPREHENSIVE METABOLIC PANEL
ALT: 22 U/L (ref 0–44)
AST: 20 U/L (ref 15–41)
Albumin: 3.8 g/dL (ref 3.5–5.0)
Alkaline Phosphatase: 65 U/L (ref 38–126)
Anion gap: 11 (ref 5–15)
BUN: 14 mg/dL (ref 8–23)
CO2: 21 mmol/L — ABNORMAL LOW (ref 22–32)
Calcium: 9.4 mg/dL (ref 8.9–10.3)
Chloride: 106 mmol/L (ref 98–111)
Creatinine, Ser: 0.88 mg/dL (ref 0.44–1.00)
GFR, Estimated: >60 mL/min (ref >60)
Glucose, Bld: 305 mg/dL — ABNORMAL HIGH (ref 70–99)
Potassium: 3.5 mmol/L (ref 3.5–5.1)
Sodium: 138 mmol/L (ref 135–145)
Total Bilirubin: 0.6 mg/dL (ref 0.3–1.2)
Total Protein: 6.9 g/dL (ref 6.5–8.1)

## 2021-11-04 LAB — APTT: aPTT: 55 seconds — ABNORMAL HIGH (ref 24–36)

## 2021-11-04 LAB — CBC WITH DIFFERENTIAL/PLATELET
Abs Immature Granulocytes: 0.04 10*3/uL (ref 0.00–0.07)
Basophils Absolute: 0.1 10*3/uL (ref 0.0–0.1)
Basophils Relative: 1 %
Eosinophils Absolute: 0 10*3/uL (ref 0.0–0.5)
Eosinophils Relative: 0 %
HCT: 41.7 % (ref 36.0–46.0)
Hemoglobin: 14.2 g/dL (ref 12.0–15.0)
Immature Granulocytes: 0 %
Lymphocytes Relative: 9 %
Lymphs Abs: 1 10*3/uL (ref 0.7–4.0)
MCH: 31.7 pg (ref 26.0–34.0)
MCHC: 34.1 g/dL (ref 30.0–36.0)
MCV: 93.1 fL (ref 80.0–100.0)
Monocytes Absolute: 0.6 10*3/uL (ref 0.1–1.0)
Monocytes Relative: 5 %
Neutro Abs: 9.3 10*3/uL — ABNORMAL HIGH (ref 1.7–7.7)
Neutrophils Relative %: 85 %
Platelets: 172 10*3/uL (ref 150–400)
RBC: 4.48 MIL/uL (ref 3.87–5.11)
RDW: 12.4 % (ref 11.5–15.5)
WBC: 11 10*3/uL — ABNORMAL HIGH (ref 4.0–10.5)
nRBC: 0 % (ref 0.0–0.2)

## 2021-11-04 LAB — LIPASE, BLOOD: Lipase: 28 U/L (ref 11–51)

## 2021-11-04 LAB — HEPARIN LEVEL (UNFRACTIONATED): Heparin Unfractionated: 0.89 IU/mL — ABNORMAL HIGH (ref 0.30–0.70)

## 2021-11-04 LAB — GLUCOSE, CAPILLARY: Glucose-Capillary: 111 mg/dL — ABNORMAL HIGH (ref 70–99)

## 2021-11-04 LAB — HEMOGLOBIN A1C
Hgb A1c MFr Bld: 8.9 % — ABNORMAL HIGH (ref 4.8–5.6)
Mean Plasma Glucose: 208.73 mg/dL

## 2021-11-04 MED ORDER — INSULIN ASPART 100 UNIT/ML IJ SOLN
0.0000 [IU] | Freq: Three times a day (TID) | INTRAMUSCULAR | Status: DC
  Administered 2021-11-05 (×2): 2 [IU] via SUBCUTANEOUS
  Administered 2021-11-06: 3 [IU] via SUBCUTANEOUS

## 2021-11-04 MED ORDER — HYDROMORPHONE HCL 1 MG/ML IJ SOLN
0.5000 mg | INTRAMUSCULAR | Status: DC | PRN
  Administered 2021-11-05: 1 mg via INTRAVENOUS
  Filled 2021-11-04: qty 1

## 2021-11-04 MED ORDER — ALBUTEROL SULFATE (2.5 MG/3ML) 0.083% IN NEBU: 2.5000 mg | INHALATION_SOLUTION | RESPIRATORY_TRACT | Status: DC | PRN

## 2021-11-04 MED ORDER — DIGOXIN 125 MCG PO TABS
0.1250 mg | ORAL_TABLET | Freq: Every day | ORAL | Status: DC
  Administered 2021-11-05 – 2021-11-06 (×2): 0.125 mg via ORAL
  Filled 2021-11-04 (×2): qty 1

## 2021-11-04 MED ORDER — ONDANSETRON HCL 4 MG/2ML IJ SOLN
4.0000 mg | Freq: Four times a day (QID) | INTRAMUSCULAR | Status: DC | PRN
  Administered 2021-11-06: 4 mg via INTRAVENOUS
  Filled 2021-11-04: qty 2

## 2021-11-04 MED ORDER — BISACODYL 5 MG PO TBEC: 5.0000 mg | DELAYED_RELEASE_TABLET | Freq: Every day | ORAL | Status: DC | PRN

## 2021-11-04 MED ORDER — ACETAMINOPHEN 500 MG PO TABS
500.0000 mg | ORAL_TABLET | Freq: Three times a day (TID) | ORAL | Status: DC | PRN
  Administered 2021-11-05: 500 mg via ORAL
  Filled 2021-11-04: qty 2

## 2021-11-04 MED ORDER — AMLODIPINE BESYLATE 10 MG PO TABS
10.0000 mg | ORAL_TABLET | Freq: Every day | ORAL | Status: DC
  Administered 2021-11-04 – 2021-11-06 (×3): 10 mg via ORAL
  Filled 2021-11-04 (×2): qty 1
  Filled 2021-11-04: qty 2

## 2021-11-04 MED ORDER — SODIUM CHLORIDE 0.9 % IV SOLN: INTRAVENOUS | Status: DC

## 2021-11-04 MED ORDER — ROSUVASTATIN CALCIUM 20 MG PO TABS
20.0000 mg | ORAL_TABLET | Freq: Every day | ORAL | Status: DC
  Administered 2021-11-04 – 2021-11-05 (×2): 20 mg via ORAL
  Filled 2021-11-04 (×2): qty 1

## 2021-11-04 MED ORDER — SENNOSIDES-DOCUSATE SODIUM 8.6-50 MG PO TABS: 1.0000 | ORAL_TABLET | Freq: Every evening | ORAL | Status: DC | PRN

## 2021-11-04 MED ORDER — MECLIZINE HCL 25 MG PO TABS: 25.0000 mg | ORAL_TABLET | Freq: Two times a day (BID) | ORAL | Status: DC | PRN

## 2021-11-04 MED ORDER — ROPINIROLE HCL 1 MG PO TABS
4.0000 mg | ORAL_TABLET | Freq: Every day | ORAL | Status: DC
  Administered 2021-11-04 – 2021-11-05 (×2): 4 mg via ORAL
  Filled 2021-11-04 (×2): qty 4

## 2021-11-04 MED ORDER — ONDANSETRON HCL 4 MG PO TABS: 4.0000 mg | ORAL_TABLET | Freq: Four times a day (QID) | ORAL | Status: DC | PRN

## 2021-11-04 MED ORDER — SODIUM CHLORIDE 0.9 % IV BOLUS
1000.0000 mL | Freq: Once | INTRAVENOUS | Status: AC
  Administered 2021-11-04: 1000 mL via INTRAVENOUS

## 2021-11-04 MED ORDER — PIPERACILLIN-TAZOBACTAM 3.375 G IVPB 30 MIN
3.3750 g | Freq: Once | INTRAVENOUS | Status: AC
  Administered 2021-11-04: 3.375 g via INTRAVENOUS
  Filled 2021-11-04: qty 50

## 2021-11-04 MED ORDER — HEPARIN (PORCINE) 25000 UT/250ML-% IV SOLN
1100.0000 [IU]/h | INTRAVENOUS | Status: DC
  Administered 2021-11-04: 1150 [IU]/h via INTRAVENOUS
  Administered 2021-11-05: 1300 [IU]/h via INTRAVENOUS
  Administered 2021-11-06: 1200 [IU]/h via INTRAVENOUS
  Filled 2021-11-04 (×3): qty 250

## 2021-11-04 MED ORDER — PIPERACILLIN-TAZOBACTAM 3.375 G IVPB
3.3750 g | Freq: Three times a day (TID) | INTRAVENOUS | Status: DC
  Administered 2021-11-04 – 2021-11-06 (×5): 3.375 g via INTRAVENOUS
  Filled 2021-11-04 (×5): qty 50

## 2021-11-04 MED ORDER — IOHEXOL 350 MG/ML SOLN
70.0000 mL | Freq: Once | INTRAVENOUS | Status: AC | PRN
  Administered 2021-11-04: 70 mL via INTRAVENOUS

## 2021-11-04 MED ORDER — LORATADINE 10 MG PO TABS
10.0000 mg | ORAL_TABLET | Freq: Every day | ORAL | Status: DC
  Administered 2021-11-04 – 2021-11-06 (×3): 10 mg via ORAL
  Filled 2021-11-04 (×3): qty 1

## 2021-11-04 MED ORDER — FENTANYL CITRATE PF 50 MCG/ML IJ SOSY
50.0000 ug | PREFILLED_SYRINGE | Freq: Once | INTRAMUSCULAR | Status: AC
  Administered 2021-11-04: 50 ug via INTRAVENOUS
  Filled 2021-11-04: qty 1

## 2021-11-04 MED ORDER — ONDANSETRON HCL 4 MG/2ML IJ SOLN
4.0000 mg | Freq: Once | INTRAMUSCULAR | Status: AC
  Administered 2021-11-04: 4 mg via INTRAVENOUS
  Filled 2021-11-04: qty 2

## 2021-11-04 NOTE — Progress Notes (Signed)
ANTICOAGULATION CONSULT NOTE - Initial Consult  Pharmacy Consult for heparin Indication: atrial fibrillation  Allergies  Allergen Reactions   Bee Venom Shortness Of Breath and Rash   Wasp Venom Other (See Comments) and Anaphylaxis   Apricot Flavor Swelling    Eyes swell shut   Atorvastatin Itching    Eye swelling Other reaction(s): HA    Patient Measurements: Height: 5\' 8"  (172.7 cm) Weight: 81.6 kg (180 lb) IBW/kg (Calculated) : 63.9 Heparin Dosing Weight: 80kg  Vital Signs: Temp: 97.6 F (36.4 C) (10/05 1941) Temp Source: Oral (10/05 1941) BP: 161/70 (10/05 1941) Pulse Rate: 61 (10/05 1941)  Labs: Recent Labs    11/04/21 1115 11/04/21 2023  HGB 14.2  --   HCT 41.7  --   PLT 172  --   APTT  --  55*  HEPARINUNFRC  --  0.89*  CREATININE 0.88  --      Estimated Creatinine Clearance: 63.8 mL/min (by C-G formula based on SCr of 0.88 mg/dL).   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 Angelena Form & Dr. Roxy Manns   Severe aortic stenosis    Type 2 diabetes mellitus without complication, without long-term current use of insulin ([place]) 04/10/2018   Vitamin D deficiency    Assessment: 32 YOF presenting with abdominal pain, concern for cholecystitis, hx of afib on Eliquis (last dose 10/4)  Heparin level came back 0.89 and PTT 55. Not correlating yet. We will increase heparin and  check level in AM.  Goal of Therapy:  Heparin level 0.3-0.7 units/ml aPTT 66-102 seconds Monitor platelets by anticoagulation protocol: Yes   Plan:  Increase heparin gtt to 1300 units/hr F/u aPTT/HL in AM F/u intervention plans and ability to transition back to [place], PharmD, [place], AAHIVP, CPP Infectious Disease Pharmacist 11/04/2021 9:41 PM

## 2021-11-04 NOTE — ED Triage Notes (Signed)
Pt arrived to the ED via EMS with complaints of right lower abdominal pain. Patient states the pain started when driving to her daughters apartment. Pt reports pain 10/10. Patient states she has been nauseous and vomited 3 times. Patient states the pain does not radiate but pain is more intense upon palpation. EMS gave 4 mg of Zofran. Patient lives at home. Patient states no history of abdominal issues. Patient is alert and oriented X 4.

## 2021-11-04 NOTE — ED Provider Notes (Signed)
[facility] Provider Note   CSN: [id number] Arrival date & time: 11/04/21  1035     History  Chief Complaint  Patient presents with   Abdominal Pain    Right lower    [facility] is a 76 y.o. female.  Patient here with right-sided abdominal pain that occurred this morning with nausea and vomiting.  History of high cholesterol, acid reflux, type 2 diabetes, atrial fibrillation status post TAVR/pacemaker.  Denies any diarrhea.  She is having a lot of discomfort in her right lower abdomen.  Denies any pain with urination.  Denies any significant abdominal surgery.  Nothing makes it worse or better.  Denies any chest pain or shortness of breath or weakness or numbness or speech changes.        Home Medications Prior to Admission medications   Medication Sig Start Date End Date Taking? Authorizing Provider  acetaminophen (TYLENOL) 500 MG tablet Take 500-1,000 mg by mouth every 8 (eight) hours as needed for moderate pain.    [provider]  albuterol (VENTOLIN HFA) 108 (90 Base) MCG/ACT inhaler Inhale 2 puffs into the lungs every 4 (four) hours as needed for wheezing or shortness of breath.    [provider]  amLODipine (NORVASC) 10 MG tablet Take 1 tablet (10 mg total) by mouth daily. 11/03/21   Gaston Islam., NP  apixaban (ELIQUIS) 5 MG TABS tablet Take 1 tablet (5 mg total) by mouth 2 (two) times daily. 06/18/21   Lyn Records, MD  cetirizine (ZYRTEC) 10 MG tablet Take 10 mg by mouth daily as needed for allergies.    [provider]  Cyanocobalamin (VITAMIN B-12 PO) Take 1 tablet by mouth daily.    [provider]  digoxin (LANOXIN) 0.125 MG tablet Take 1 tablet (0.125 mg total) by mouth daily. 06/11/21   Janetta Hora, PA-C  meclizine (ANTIVERT) 25 MG tablet Take 25 mg by mouth 2 (two) times daily as needed (vertigo). 11/12/20   [provider]  metFORMIN (GLUCOPHAGE-XR) 750 MG 24 hr  tablet Take 750 mg by mouth daily. 11/12/20   [provider]  metoprolol tartrate (LOPRESSOR) 100 MG tablet Take 1 tablet (100 mg total) by mouth 2 (two) times daily. Patient not taking: Reported on 11/03/2021 06/11/21   Janetta Hora, PA-C  Multiple Vitamins-Minerals (MULTIVITAMIN WITH MINERALS) tablet Take 1 tablet by mouth daily.    [provider]  Naphazoline-Pheniramine (ALLERGY EYE OP) Place 1 drop into both eyes daily as needed (red/itchy eyes). Patient not taking: Reported on 11/03/2021    [provider]  rOPINIRole (REQUIP) 4 MG tablet Take 4 mg by mouth at bedtime.    [provider]  rosuvastatin (CRESTOR) 20 MG tablet Take 1 tablet (20 mg total) by mouth daily in the afternoon. 09/15/20   Lyn Records, MD      Allergies    Bee venom, Wasp venom, Apricot flavor, and Atorvastatin    Review of Systems   Review of Systems  Physical Exam Updated Vital Signs BP (!) 165/77 (BP Location: Right Arm)   Pulse 62   Temp 97.9 F (36.6 C) (Oral)   Resp 17   Ht 5\' 8"  (1.727 m)   Wt 81.6 kg   SpO2 96%   BMI 27.37 kg/m  Physical Exam Vitals and nursing note reviewed.  Constitutional:      General: She is in acute distress.     Appearance: She is  well-developed. She is ill-appearing.  HENT:     Head: Normocephalic and atraumatic.  Eyes:     Conjunctiva/sclera: Conjunctivae normal.  Cardiovascular:     Rate and Rhythm: Normal rate and regular rhythm.     Heart sounds: No murmur heard. Pulmonary:     Effort: Pulmonary effort is normal. No respiratory distress.     Breath sounds: Normal breath sounds.  Abdominal:     Palpations: Abdomen is soft.     Tenderness: There is abdominal tenderness in the right upper quadrant and right lower quadrant. There is guarding.  Musculoskeletal:        General: No swelling.     Cervical back: Neck supple.  Skin:    General: Skin is warm and dry.     Capillary Refill: Capillary refill takes less  than 2 seconds.  Neurological:     Mental Status: She is alert.  Psychiatric:        Mood and Affect: Mood normal.     ED Results / Procedures / Treatments   Labs (all labs ordered are listed, but only abnormal results are displayed) Labs Reviewed  CBC WITH DIFFERENTIAL/PLATELET - Abnormal; Notable for the following components:      Result Value   WBC 11.0 (*)    Neutro Abs 9.3 (*)    All other components within normal limits  COMPREHENSIVE METABOLIC PANEL - Abnormal; Notable for the following components:   CO2 21 (*)    Glucose, Bld 305 (*)    All other components within normal limits  URINALYSIS, ROUTINE W REFLEX MICROSCOPIC - Abnormal; Notable for the following components:   APPearance HAZY (*)    Glucose, UA >=500 (*)    Hgb urine dipstick LARGE (*)    Protein, ur 100 (*)    RBC / HPF >50 (*)    All other components within normal limits  LIPASE, BLOOD    EKG EKG Interpretation  Date/Time:  Thursday November 04 2021 10:47:32 EDT Ventricular Rate:  60 PR Interval:  174 QRS Duration: 155 QT Interval:  501 QTC Calculation: 501 R Axis:   -69 Text Interpretation: paced rhythm LVH with IVCD, LAD and secondary repol abnrm Confirmed by Ronnald Nian, Kree Armato ([id number]) on 11/04/2021 10:50:53 AM  Radiology US Abdomen Limited RUQ (LIVER/GB)  Result Date: 11/04/2021 CLINICAL DATA:  Right upper quadrant pain EXAM: ULTRASOUND ABDOMEN LIMITED RIGHT UPPER QUADRANT COMPARISON:  CT 06/21/2020 FINDINGS: Gallbladder: Mild gallbladder wall thickening up to 3.5 mm. No gallstones or sludge is visualized. A positive Murphy sign was reported by the sonographer. Common bile duct: Diameter: 15 mm. Liver: No focal lesion identified. Diffusely increased hepatic parenchymal echogenicity. Portal vein is patent on color Doppler imaging with normal direction of blood flow towards the liver. Other: None. Sonographer reported technically limited exam secondary to poor penetration related to patient body habitus.  IMPRESSION: 1. Mild gallbladder wall thickening with a positive sonographic Murphy sign. Although no gallstones are visualized, findings are suspicious for acute cholecystitis. 2. The echogenicity of the liver is increased. This is a nonspecific finding but is most commonly seen with fatty infiltration of the liver. There are no obvious focal liver lesions. 3. Dilated common bile duct measuring up to 15 mm. Correlate with liver function tests. Electronically Signed   By: Davina Poke D.O.   On: 11/04/2021 11:50    Procedures Procedures    Medications Ordered in ED Medications  piperacillin-tazobactam (ZOSYN) IVPB 3.375 g (has no administration in time range)  Followed by  piperacillin-tazobactam (ZOSYN) IVPB 3.375 g (has no administration in time range)  fentaNYL (SUBLIMAZE) injection 50 mcg (50 mcg Intravenous Given 11/04/21 1110)  sodium chloride 0.9 % bolus 1,000 mL (0 mLs Intravenous Stopped 11/04/21 1210)  ondansetron (ZOFRAN) injection 4 mg (4 mg Intravenous Given 11/04/21 1110)    ED Course/ Medical Decision Making/ A&P                           Medical Decision Making Amount and/or Complexity of Data Reviewed Labs: ordered. Radiology: ordered.  Risk Prescription drug management. Decision regarding hospitalization.   Eddye B Tamburri is here with abdominal pain.  Unremarkable vitals.  No fever.  Right-sided pain on exam right lower quadrant and right upper quadrant.  Differential diagnosis is cholecystitis versus pancreatitis versus appendicitis versus bowel obstruction versus colitis.  She is neurovascular intact on exam.  She has no chest pain or shortness of breath.  EKG showed rhythm with LVH pattern.  We will get CBC, CMP, lipase, urinalysis, CT scan abdomen pelvis and ultrasound of the right upper quadrant.  Will give IV fluids, IV Zofran and IV fentanyl and reevaluate.  Per my review and interpretation of labs is no significant anemia, electrolyte abnormality, kidney  injury or leukocytosis.  Ultrasound of the gallbladder shows gallbladder wall thickening with positive Murphy sign.  However no gallstones or sludge.  Suspect a calculus acute cholecystitis.  Common bile duct is dilated to 15 mm however, lipase is normal, liver enzymes and gallbladder enzymes within normal limits.  I talked with Dr. Marca Ancona with gastroenterology as well as Dr. Quintella Reichert with general surgery.  Patient does have a pacemaker.  This was placed last year.  The plan is for MRCP if patient is able to get it otherwise may need cholecystectomy with Intra-Op intra cholangiogram.  We will continue to pursue CT scan abdomen pelvis to see if that elucidates any more here in the interim.  Patient has been started on IV Zosyn.  To be admitted to [facility] for further care.  This chart was dictated using voice recognition software.  Despite best efforts to proofread,  errors can occur which can change the documentation meaning.         Final Clinical Impression(s) / ED Diagnoses Final diagnoses:  Acalculous cholecystitis    Rx / DC Orders ED Discharge Orders     None         Virgina Norfolk, DO 11/04/21 1338

## 2021-11-04 NOTE — ED Notes (Signed)
Patient helped to bedside commode. Pt family at bedside. Denies further needs.

## 2021-11-04 NOTE — ED Notes (Addendum)
Pt moved to room 44 in yellow. Pt placed on monitoring. Call bell in reach. Side rails up. Report given and care endorsed.

## 2021-11-04 NOTE — Progress Notes (Signed)
ANTICOAGULATION CONSULT NOTE - Initial Consult  Pharmacy Consult for heparin Indication: atrial fibrillation  Allergies  Allergen Reactions   Bee Venom Shortness Of Breath and Rash   Wasp Venom Other (See Comments) and Anaphylaxis   Apricot Flavor Swelling    Eyes swell shut   Atorvastatin Itching    Eye swelling Other reaction(s): HA    Patient Measurements: Height: 5\' 8"  (172.7 cm) Weight: 81.6 kg (180 lb) IBW/kg (Calculated) : 63.9 Heparin Dosing Weight: 80kg  Vital Signs: Temp: 97.9 F (36.6 C) (10/05 1315) Temp Source: Oral (10/05 1315) BP: 110/69 (10/05 1330) Pulse Rate: 59 (10/05 1330)  Labs: Recent Labs    11/04/21 1115  HGB 14.2  HCT 41.7  PLT 172  CREATININE 0.88    Estimated Creatinine Clearance: 63.8 mL/min (by C-G formula based on SCr of 0.88 mg/dL).   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 Angelena Form & Dr. Roxy Manns   Severe aortic stenosis    Type 2 diabetes mellitus without complication, without long-term current use of insulin ([facility]) 04/10/2018   Vitamin D deficiency    Assessment: 9 YOF presenting with abdominal pain, concern for cholecystitis, hx of afib on Eliquis (last dose 10/4)  Goal of Therapy:  Heparin level 0.3-0.7 units/ml aPTT 66-102 seconds Monitor platelets by anticoagulation protocol: Yes   Plan:  Heparin gtt at 1150 units/hr, no  bolus F/u 6 hour aPTT/HL F/u intervention plans and ability to transition back to PO  Bertis Ruddy, PharmD Clinical Pharmacist ED Pharmacist Phone # [phone] 11/04/2021 2:31 PM

## 2021-11-04 NOTE — Progress Notes (Signed)
Pharmacy Antibiotic Note  ALMEDA EZRA is a 76 y.o. female admitted on 11/04/2021 with abdominal pain. On presentation she was afebrile, WBC elevated. Pharmacy has been consulted for Zosyn dosing for treatment of suspected intra-abdominal infection.   Plan: Initiate zosyn 3.375 mg Q8h Monitor renal function and resolution of infection  Height: 5\' 8"  (172.7 cm) Weight: 81.6 kg (180 lb) IBW/kg (Calculated) : 63.9  Temp (24hrs), Avg:97.6 F (36.4 C), Min:97.6 F (36.4 C), Max:97.6 F (36.4 C)  Recent Labs  Lab 11/04/21 1115  WBC 11.0*  CREATININE 0.88    Estimated Creatinine Clearance: 63.8 mL/min (by C-G formula based on SCr of 0.88 mg/dL).    Allergies  Allergen Reactions   Bee Venom Shortness Of Breath and Rash   Wasp Venom Other (See Comments) and Anaphylaxis   Apricot Flavor Swelling    Eyes swell shut   Atorvastatin Itching    Eye swelling Other reaction(s): HA    Antimicrobials this admission: Zosyn 10/5 >>   Dose adjustments this admission: N/a  Microbiology results: N/a  Thank you for allowing pharmacy to be a part of this patient's care.  Titus Dubin, PharmD PGY1 Pharmacy Resident 11/04/2021 1:32 PM

## 2021-11-04 NOTE — ED Notes (Signed)
MRI called pt reports having pacemaker and hx of valve replacement and pt reports claustrophobia. MRI aware.

## 2021-11-04 NOTE — ED Notes (Signed)
Zosyn started. Family at bedside. Pt denies further needs. Pt rates pain 0/10.

## 2021-11-04 NOTE — Consult Note (Signed)
Briana Foster 1945-10-06  [id number].    Requesting MD: Dr. Ronnald Nian Chief Complaint/Reason for Consult: R sided abdominal pain  HPI: Briana Foster is a 76 year old female with multiple medical problems including prior TAVR, CAD, HTN, HLD, AF on Eliquis (last dose 11/03/21 pm), RA, DM2 and pacemaker for tachybradycardia syndrome who presented the emergency department for abdominal pain.  Patient reports she awoke this morning with no abdominal pain.  She notes a few hours later she began having acute onset of right-sided abdominal pain that was diffuse but worse in the lower abdomen versus the upper abdomen.  Associated nausea and vomiting.  No fever, chills, constipation, diarrhea or urinary symptoms.  Last BM yesterday and normal.  No bloody stools.  Has never had a pain like this before.  Nothing seems to make the pain worse.  She received pain medication in the ED and notes her symptoms have greatly improved.   Patient lives at home with her daughter.  Per discussion with family and patient it appears very short distances or less than 1 flight of stairs can cause the patient's significant fatigue/shortness of breath.  She denies any alcohol or tobacco use.  She denies any prior abdominal surgeries.  Reports last colonoscopy was ~3 years ago with eagle and normal with no polyps (cannot see report). Reports remote history of endoscopy but does not member what that showed.  Denies frequent NSAID use or history of ulcers.  ROS: ROS As above, see hpi  Family History  Problem Relation Age of Onset   Heart disease Mother    Stroke Mother    Cancer Father        Prostate    Past Medical History:  Diagnosis Date   Anemia    Arthritis 07-20-12   hands   Asthma    Back pain    Carotid arterial disease (HCC)    Class 1 obesity with serious comorbidity and body mass index (BMI) of 31.0 to 31.9 in adult 03/26/2018   Depression    Essential hypertension 03/26/2018   GERD  (gastroesophageal reflux disease)    "comes and goes" - no meds currently   Hyperlipidemia    Joint pain    Persistent atrial fibrillation (HCC)    Restless leg syndrome    Rheumatoid arthritis (HCC)    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 Angelena Form & Dr. Roxy Manns   Severe aortic stenosis    Type 2 diabetes mellitus without complication, without long-term current use of insulin ([place]) 04/10/2018   Vitamin D deficiency     Past Surgical History:  Procedure Laterality Date   AV NODE ABLATION N/A 07/14/2021   Procedure: AV NODE ABLATION;  Surgeon: Constance Haw, MD;  Location: [place] [facility];  Service: Cardiovascular;  Laterality: N/A;   BUNIONECTOMY  20 yrs ago   bil feet   BUNIONECTOMY  11/30/2011   Procedure: BUNIONECTOMY;  Surgeon: Magnus Sinning, MD;  Location: [facility];  Service: Orthopedics;  Laterality: Bilateral;  RIGHT FOOT EXCISION OF BUNIONETTE AND PARTIAL   PROXIMAL PHALANGECTOMY OF 5TH TOE LEFT FOOT FUNK BUNIONECTOMY,EXCISION OF BUNIONETTE    CARDIOVERSION N/A 11/05/2019   Procedure: CARDIOVERSION;  Surgeon: Lelon Perla, MD;  Location: [facility] END[facility]COPY;  Service: Cardiovascular;  Laterality: N/A;   CARDIOVERSION N/A 12/05/2019   Procedure: CARDIOVERSION;  Surgeon: Elouise Munroe, MD;  Location: [place];  Service: Cardiovascular;  Laterality: N/A;   COLONOSCOPY WITH PROPOFOL N/A 08/07/2012   Procedure: COLONOSCOPY WITH PROPOFOL;  Surgeon: Garlan Fair, MD;  Location: [facility];  Service: Endoscopy;  Laterality: N/A;   MOUTH SURGERY     teeth extractions   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   Procedure: PACEMAKER IMPLANT;  Surgeon: Constance Haw, MD;  Location: [place] [facility];  Service: Cardiovascular;  Laterality: N/A;   RIGHT/LEFT HEART CATH AND CORONARY ANGIOGRAPHY N/A 06/17/2020   Procedure: RIGHT/LEFT HEART CATH AND CORONARY ANGIOGRAPHY;  Surgeon: Belva Crome, MD;  Location: [place] [facility];  Service: Cardiovascular;  Laterality: N/A;   TONSILLECTOMY     TRANSCATHETER AORTIC VALVE REPLACEMENT, TRANSFEMORAL N/A 06/30/2020   Procedure: TRANSCATHETER AORTIC VALVE REPLACEMENT, TRANSFEMORAL;  Surgeon: Burnell Blanks, MD;  Location: [place] [facility];  Service: Open Heart Surgery;  Laterality: N/A;   TUBAL LIGATION     WRIST SURGERY      Social History:  reports that she has never smoked. She has never used smokeless tobacco. She reports that she does not drink alcohol and does not use drugs.  Allergies:  Allergies  Allergen Reactions   Bee Venom Shortness Of Breath and Rash   Wasp Venom Other (See Comments) and Anaphylaxis   Apricot Flavor Swelling    Eyes swell shut   Atorvastatin Itching    Eye swelling Other reaction(s): HA    (Not in a hospital admission)    Physical Exam: Blood pressure (!) 165/77, pulse 62, temperature 97.9 F (36.6 C), temperature source Oral, resp. rate 17, height 5\' 8"  (1.727 m), weight 81.6 kg, SpO2 96 %. General: pleasant, WD/WN white female who is laying in bed in NAD HEENT: head is normocephalic, atraumatic.  Sclera are noninjected.  PERRL.  Ears and nose without any masses or lesions.  Mouth is pink and moist. Dentition fair Heart: regular rate, paced rhythm Lungs: CTAB, no wheezes, rhonchi, or rales noted.  Respiratory effort nonlabored Abd:  Soft, obese, ND, RLQ > RUQ ttp. Negative Murphy's sign. +BS. No obvious masses, hernias, or organomegaly MS: no BUE/BLE edema Skin: warm and dry with no masses, lesions, or rashes Psych: A&Ox4 with an appropriate affect Neuro: cranial nerves grossly intact, normal speech, thought process intact, moves all extremities, gait not assessed   Results for orders placed or performed during the hospital encounter of 11/04/21 (from the past 48 hour(s))  CBC with Differential     Status: Abnormal   Collection Time: 11/04/21 11:15 AM  Result Value Ref Range    WBC 11.0 (H) 4.0 - 10.5 K/uL   RBC 4.48 3.87 - 5.11 MIL/uL   Hemoglobin 14.2 12.0 - 15.0 g/dL   HCT 41.7 36.0 - 46.0 %   MCV 93.1 80.0 - 100.0 fL   MCH 31.7 26.0 - 34.0 pg   MCHC 34.1 30.0 - 36.0 g/dL   RDW 12.4 11.5 - 15.5 %   Platelets 172 150 - 400 K/uL   nRBC 0.0 0.0 - 0.2 %   Neutrophils Relative % 85 %   Neutro Abs 9.3 (H) 1.7 - 7.7 K/uL   Lymphocytes Relative 9 %   Lymphs Abs 1.0 0.7 - 4.0 K/uL   Monocytes Relative 5 %   Monocytes Absolute 0.6 0.1 - 1.0 K/uL   Eosinophils Relative 0 %   Eosinophils Absolute 0.0 0.0 - 0.5 K/uL   Basophils Relative 1 %   Basophils Absolute 0.1 0.0 - 0.1 K/uL   Immature  Granulocytes 0 %   Abs Immature Granulocytes 0.04 0.00 - 0.07 K/uL    Comment: Performed at [facility], [place] [address], [place], [place] [address]  Comprehensive metabolic panel     Status: Abnormal   Collection Time: 11/04/21 11:15 AM  Result Value Ref Range   Sodium 138 135 - 145 mmol/L   Potassium 3.5 3.5 - 5.1 mmol/L   Chloride 106 98 - 111 mmol/L   CO2 21 (L) 22 - 32 mmol/L   Glucose, Bld 305 (H) 70 - 99 mg/dL    Comment: Glucose reference range applies only to samples taken after fasting for at least 8 hours.   BUN 14 8 - 23 mg/dL   Creatinine, Ser 0.88 0.44 - 1.00 mg/dL   Calcium 9.4 8.9 - 10.3 mg/dL   Total Protein 6.9 6.5 - 8.1 g/dL   Albumin 3.8 3.5 - 5.0 g/dL   AST 20 15 - 41 U/L   ALT 22 0 - 44 U/L   Alkaline Phosphatase 65 38 - 126 U/L   Total Bilirubin 0.6 0.3 - 1.2 mg/dL   GFR, Estimated >60 >60 mL/min    Comment: (NOTE) Calculated using the CKD-EPI Creatinine Equation (2021)    Anion gap 11 5 - 15    Comment: Performed at [facility] [address], [place], [place] [address]  Lipase, blood     Status: None   Collection Time: 11/04/21 11:15 AM  Result Value Ref Range   Lipase 28 11 - 51 U/L    Comment: Performed at [facility] [address] [place], [place] [address]  Urinalysis, Routine w reflex microscopic Urine,  Clean Catch     Status: Abnormal   Collection Time: 11/04/21 12:40 PM  Result Value Ref Range   Color, Urine YELLOW YELLOW   APPearance HAZY (A) CLEAR   Specific Gravity, Urine 1.014 1.005 - 1.030   pH 5.0 5.0 - 8.0   Glucose, UA >=500 (A) NEGATIVE mg/dL   Hgb urine dipstick LARGE (A) NEGATIVE   Bilirubin Urine NEGATIVE NEGATIVE   Ketones, ur NEGATIVE NEGATIVE mg/dL   Protein, ur 100 (A) NEGATIVE mg/dL   Nitrite NEGATIVE NEGATIVE   Leukocytes,Ua NEGATIVE NEGATIVE   RBC / HPF >50 (H) 0 - 5 RBC/hpf   WBC, UA 6-10 0 - 5 WBC/hpf   Bacteria, UA NONE SEEN NONE SEEN   Squamous Epithelial / LPF 0-5 0 - 5   Mucus PRESENT     Comment: Performed at [facility], [place] [address] [place], [place] [address]   US Abdomen Limited RUQ (LIVER/GB)  Result Date: 11/04/2021 CLINICAL DATA:  Right upper quadrant pain EXAM: ULTRASOUND ABDOMEN LIMITED RIGHT UPPER QUADRANT COMPARISON:  CT 06/21/2020 FINDINGS: Gallbladder: Mild gallbladder wall thickening up to 3.5 mm. No gallstones or sludge is visualized. A positive Murphy sign was reported by the sonographer. Common bile duct: Diameter: 15 mm. Liver: No focal lesion identified. Diffusely increased hepatic parenchymal echogenicity. Portal vein is patent on color Doppler imaging with normal direction of blood flow towards the liver. Other: None. Sonographer reported technically limited exam secondary to poor penetration related to patient body habitus. IMPRESSION: 1. Mild gallbladder wall thickening with a positive sonographic Murphy sign. Although no gallstones are visualized, findings are suspicious for acute cholecystitis. 2. The echogenicity of the liver is increased. This is a nonspecific finding but is most commonly seen with fatty infiltration of the liver. There are no obvious focal liver  lesions. 3. Dilated common bile duct measuring up to 15 mm. Correlate with liver function tests. Electronically Signed   By: Davina Poke D.O.   On: 11/04/2021 11:50     Anti-infectives (From admission, onward)    Start     Dose/Rate Route Frequency Ordered Stop   11/04/21 2000  piperacillin-tazobactam (ZOSYN) IVPB 3.375 g       See Hyperspace for full Linked Orders Report.   3.375 g 12.5 mL/hr over 240 Minutes Intravenous Every 8 hours 11/04/21 1335     11/04/21 1345  piperacillin-tazobactam (ZOSYN) IVPB 3.375 g       See Hyperspace for full Linked Orders Report.   3.375 g 100 mL/hr over 30 Minutes Intravenous  Once 11/04/21 1335         Assessment/Plan Right sided abdominal pain Possible acalculous cholecystitis This is a 76 year old female with multiple medical problems including prior TAVR, CAD, HTN, HLD, AF on Eliquis (last dose 11/03/21 pm), RA, DM2 and pacemaker for tachybradycardia syndrome who presented with right-sided abdominal pain, nausea and vomiting this morning.  Pain seems more RLQ than RUQ but she does have diffuse right-sided tenderness on exam.  RUQ [place] mentions possible cholecystitis based on gallbladder wall thickening (very mild at 3.41mm) but no evidence of gallstones or sludge.  Appears on prior CT of the abdomen there was no evidence of gallstones in 2022 either. CBD was noted to be dilated at 25mm - EDP has reached out to GI who reportedly recommended either lap chole with IOC versus MRCP if pacemaker is compatible to further evaluate this and a call back.  Her LFTs are normal and again she has no gallstones on any imaging.  It appears her pain is more in the RLQ on exam.  A CT A/P is already ordered to further evaluate other etiologies of her pain.  If CT does not show any other etiologies, would recommend HIDA to further evaluate for possible acalculous cholecystitis.  Patient has significant past medical history and would likely be high risk for surgery.  If upon above pathway she ultimately is found to definitively have acalculous cholecystitis she would need cardiology evaluation to help better delineate if she was a surgical  candidate or would need a percutaneous cholecystostomy tube. Agree with [facility] admit. Will follow up on CT. Keep npo for now.   I reviewed nursing notes, ED provider notes, last 24 h vitals and pain scores, last 48 h intake and output, last 24 h labs and trends, and last 24 h imaging results.  Jillyn Ledger, [facility] Surgery 11/04/2021, 2:11 PM Please see Amion for pager number during day hours 7:00am-4:30pm

## 2021-11-04 NOTE — H&P (Signed)
History and Physical    Briana Foster [id number] DOB: 1945-02-02 DOA: 11/04/2021  PCP: Wenda Low, MD (Confirm with patient/family/NH records and if not entered, this has to be entered at [facility] point of entry) Patient coming from: Home  I have personally briefly reviewed patient's old medical records in [place]  Chief Complaint: Belly hurts  HPI: Briana Foster is a 76 y.o. female with medical history significant of chronic A-fib on Eliquis, severe aortic stenosis status post TAVR, HTN, HLD, IIDM, AV node ablation and PPM, presented with new onset of right upper quadrant abdominal pain.  Symptoms started this morning, patient woke up with severe cramping-like RUQ abdominal pain 10/10, vomited 2 times of greenish color vomitus.  Episode of chills but no fever.  Denies any chest pain shortness of breath.  ED Course: Afebrile, no tachycardia no hypotension.  RUQ ultrasound positive Murphy sign.  Suspicious for acalculous cholecystitis.  CT abdomen pel with no significant CBD dilatation or CBD obstruction.  Zosyn started in the ED.  Review of Systems: As per HPI otherwise 14 point review of systems negative.    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 Angelena Form & Dr. Roxy Manns   Severe aortic stenosis    Type 2 diabetes mellitus without complication, without long-term current use of insulin ([place])  04/10/2018   Vitamin D deficiency     Past Surgical History:  Procedure Laterality Date   AV NODE ABLATION N/A 07/14/2021   Procedure: AV NODE ABLATION;  Surgeon: Constance Haw, MD;  Location: [place] [facility];  Service: Cardiovascular;  Laterality: N/A;   BUNIONECTOMY  20 yrs ago   bil feet   BUNIONECTOMY  11/30/2011   Procedure: BUNIONECTOMY;  Surgeon: Magnus Sinning, MD;  Location: [facility];  Service: Orthopedics;  Laterality: Bilateral;  RIGHT FOOT EXCISION OF BUNIONETTE AND PARTIAL   PROXIMAL PHALANGECTOMY OF 5TH TOE LEFT FOOT FUNK BUNIONECTOMY,EXCISION OF BUNIONETTE    CARDIOVERSION N/A 11/05/2019   Procedure: CARDIOVERSION;  Surgeon: Lelon Perla, MD;  Location: [facility];  Service: Cardiovascular;  Laterality: N/A;   CARDIOVERSION N/A 12/05/2019   Procedure: CARDIOVERSION;  Surgeon: Elouise Munroe, MD;  Location: [place];  Service: Cardiovascular;  Laterality: N/A;   COLONOSCOPY WITH PROPOFOL N/A 08/07/2012   Procedure: COLONOSCOPY WITH PROPOFOL;  Surgeon: Garlan Fair, MD;  Location: [facility];  Service: Endoscopy;  Laterality: N/A;   MOUTH SURGERY     teeth extractions   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   Procedure: PACEMAKER IMPLANT;  Surgeon: Constance Haw, MD;  Location: [place] [facility];  Service: Cardiovascular;  Laterality: N/A;   RIGHT/LEFT HEART CATH AND CORONARY ANGIOGRAPHY N/A 06/17/2020   Procedure: RIGHT/LEFT HEART CATH AND CORONARY ANGIOGRAPHY;  Surgeon: Belva Crome, MD;  Location: [place] [facility];  Service: Cardiovascular;  Laterality: N/A;   TONSILLECTOMY     TRANSCATHETER AORTIC VALVE REPLACEMENT, TRANSFEMORAL N/A 06/30/2020   Procedure: TRANSCATHETER AORTIC VALVE REPLACEMENT, TRANSFEMORAL;  Surgeon: Burnell Blanks, MD;  Location: [place] [facility];  Service: Open Heart Surgery;  Laterality: N/A;   TUBAL LIGATION     WRIST SURGERY       reports that she has never smoked. She has never used smokeless tobacco.  She reports that she does not drink alcohol and does not use drugs.  Allergies  Allergen Reactions   Bee Venom Shortness Of Breath and Rash   Wasp Venom Other (See Comments) and Anaphylaxis   Apricot Flavor Swelling    Eyes swell shut   Atorvastatin Itching    Eye swelling Other reaction(s): HA    Family History  Problem Relation Age of Onset   Heart disease Mother    Stroke Mother    Cancer Father        Prostate     Prior to Admission medications   Medication Sig Start Date End Date Taking? Authorizing Provider  acetaminophen (TYLENOL) 500 MG tablet Take 500-1,000 mg by mouth every 8 (eight) hours as needed for moderate pain.    [provider]  albuterol (VENTOLIN HFA) 108 (90 Base) MCG/ACT inhaler Inhale 2 puffs into the lungs every 4 (four) hours as needed for wheezing or shortness of breath.    [provider]  amLODipine (NORVASC) 10 MG tablet Take 1 tablet (10 mg total) by mouth daily. 11/03/21   [name], NP  apixaban (ELIQUIS) 5 MG TABS tablet Take 1 tablet (5 mg total) by mouth 2 (two) times daily. 06/18/21   Belva Crome, MD  cetirizine (ZYRTEC) 10 MG tablet Take 10 mg by mouth daily as needed for allergies.    [provider]  Cyanocobalamin (VITAMIN B-12 PO) Take 1 tablet by mouth daily.    [provider]  digoxin (LANOXIN) 0.125 MG tablet Take 1 tablet (0.125 mg total) by mouth daily. 06/11/21   [name], PA-C  meclizine (ANTIVERT) 25 MG tablet Take 25 mg by mouth 2 (two) times daily as needed (vertigo). 11/12/20   [provider]  metFORMIN (GLUCOPHAGE-XR) 750 MG 24 hr tablet Take 750 mg by mouth daily. 11/12/20   [provider]  metoprolol tartrate (LOPRESSOR) 100 MG tablet Take 1 tablet (100 mg total) by mouth 2 (two) times daily. Patient not taking: Reported on 11/03/2021 06/11/21   [name], PA-C  Multiple Vitamins-Minerals (MULTIVITAMIN WITH MINERALS) tablet Take 1 tablet by  mouth daily.    [provider]  Naphazoline-Pheniramine (ALLERGY EYE OP) Place 1 drop into both eyes daily as needed (red/itchy eyes). Patient not taking: Reported on 11/03/2021    [provider]  rOPINIRole (REQUIP) 4 MG tablet Take 4 mg by mouth at bedtime.    [provider]  rosuvastatin (CRESTOR) 20 MG tablet Take 1 tablet (20 mg total) by mouth daily in the afternoon. 09/15/20   Belva Crome, MD    Physical Exam: Vitals:   11/04/21 1200 11/04/21 1315 11/04/21 1315 11/04/21 1330  BP: (!) 170/93 (!) 162/98 (!) 165/77 110/69  Pulse: 60 (!) 58 62 (!) 59  Resp: 14 (!) 23 17 (!) 21  Temp:   97.9 F (36.6 C)   TempSrc:   Oral   SpO2: 99% 98% 96% 92%  Weight:  Height:        Constitutional: NAD, calm, comfortable Vitals:   11/04/21 1200 11/04/21 1315 11/04/21 1315 11/04/21 1330  BP: (!) 170/93 (!) 162/98 (!) 165/77 110/69  Pulse: 60 (!) 58 62 (!) 59  Resp: 14 (!) 23 17 (!) 21  Temp:   97.9 F (36.6 C)   TempSrc:   Oral   SpO2: 99% 98% 96% 92%  Weight:      Height:       Eyes: PERRL, lids and conjunctivae normal ENMT: Mucous membranes are moist. Posterior pharynx clear of any exudate or lesions.Normal dentition.  Neck: normal, supple, no masses, no thyromegaly Respiratory: clear to auscultation bilaterally, no wheezing, no crackles. Normal respiratory effort. No accessory muscle use.  Cardiovascular: Regular rate and rhythm, no murmurs / rubs / gallops. No extremity edema. 2+ pedal pulses. No carotid bruits.  Abdomen: Significant RUQ tenderness, no rebound no guarding, no masses palpated. No hepatosplenomegaly. Bowel sounds positive.  Musculoskeletal: no clubbing / cyanosis. No joint deformity upper and lower extremities. Good ROM, no contractures. Normal muscle tone.  Skin: no rashes, lesions, ulcers. No induration Neurologic: CN 2-12 grossly intact. Sensation intact, DTR normal. Strength 5/5 in all 4.  Psychiatric: Normal judgment and  insight. Alert and oriented x 3. Normal mood.     Labs on Admission: I have personally reviewed following labs and imaging studies  CBC: Recent Labs  Lab 11/04/21 1115  WBC 11.0*  NEUTROABS 9.3*  HGB 14.2  HCT 41.7  MCV 93.1  PLT [id number]   Basic Metabolic Panel: Recent Labs  Lab 11/04/21 1115  NA 138  K 3.5  CL 106  CO2 21*  GLUCOSE 305*  BUN 14  CREATININE 0.88  CALCIUM 9.4   GFR: Estimated Creatinine Clearance: 63.8 mL/min (by C-G formula based on SCr of 0.88 mg/dL). Liver Function Tests: Recent Labs  Lab 11/04/21 1115  AST 20  ALT 22  ALKPHOS 65  BILITOT 0.6  PROT 6.9  ALBUMIN 3.8   Recent Labs  Lab 11/04/21 1115  LIPASE 28   No results for input(s): "AMMONIA" in the last 168 hours. Coagulation Profile: No results for input(s): "INR", "PROTIME" in the last 168 hours. Cardiac Enzymes: No results for input(s): "CKTOTAL", "CKMB", "CKMBINDEX", "TROPONINI" in the last 168 hours. BNP (last 3 results) No results for input(s): "PROBNP" in the last 8760 hours. HbA1C: No results for input(s): "HGBA1C" in the last 72 hours. CBG: No results for input(s): "GLUCAP" in the last 168 hours. Lipid Profile: No results for input(s): "CHOL", "HDL", "LDLCALC", "TRIG", "CHOLHDL", "LDLDIRECT" in the last 72 hours. Thyroid Function Tests: No results for input(s): "TSH", "T4TOTAL", "FREET4", "T3FREE", "THYROIDAB" in the last 72 hours. Anemia Panel: No results for input(s): "VITAMINB12", "FOLATE", "FERRITIN", "TIBC", "IRON", "RETICCTPCT" in the last 72 hours. Urine analysis:    Component Value Date/Time   COLORURINE YELLOW 11/04/2021 1240   APPEARANCEUR HAZY (A) 11/04/2021 1240   LABSPEC 1.014 11/04/2021 1240   PHURINE 5.0 11/04/2021 1240   GLUCOSEU >=500 (A) 11/04/2021 1240   HGBUR LARGE (A) 11/04/2021 1240   BILIRUBINUR NEGATIVE 11/04/2021 1240   KETONESUR NEGATIVE 11/04/2021 1240   PROTEINUR 100 (A) 11/04/2021 1240   NITRITE NEGATIVE 11/04/2021 1240   LEUKOCYTESUR  NEGATIVE 11/04/2021 1240    Radiological Exams on Admission: CT ABDOMEN PELVIS W CONTRAST  Result Date: 11/04/2021 CLINICAL DATA:  acute abdominal pain in a 76 year old female. EXAM: CT ABDOMEN AND PELVIS WITH CONTRAST TECHNIQUE: Multidetector CT imaging of the abdomen and  pelvis was performed using the standard protocol following bolus administration of intravenous contrast. RADIATION DOSE REDUCTION: This exam was performed according to the departmental dose-optimization program which includes automated exposure control, adjustment of the mA and/or kV according to patient size and/or use of iterative reconstruction technique. CONTRAST:  18mL OMNIPAQUE IOHEXOL 350 MG/ML SOLN COMPARISON:  Chest abdomen and pelvis study of Jun 21, 2020. 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 No pleural effusion. Mitral annular calcification and signs of cardiac pacer device incompletely evaluated. Hepatobiliary: Lobular hepatic contours with signs of hepatic steatosis. Portal vein is patent. Hepatic veins are patent. Reflux of contrast into hepatic veins. No focal, suspicious hepatic lesion, biliary duct dilation or pericholecystic stranding. Pancreas: Mild atrophy of the pancreas without ductal dilation, inflammation or visible lesion. Spleen: Normal. Adrenals/Urinary Tract: Adrenal glands are normal. Symmetric renal enhancement without hydronephrosis. No ureteral calculi, perivesical stranding or perinephric stranding. No suspicious renal lesion. Nephrolithiasis in the upper pole the RIGHT kidney similar to prior imaging. Stomach/Bowel: Normal appendix. Stomach without adjacent stranding. Small bowel without signs of obstruction or inflammation. Scattered colonic diverticulosis without signs of diverticulitis. Vascular/Lymphatic: Aortic atherosclerosis. No sign of aneurysm. Smooth contour of the IVC. There is no gastrohepatic or hepatoduodenal  ligament lymphadenopathy. No retroperitoneal or mesenteric lymphadenopathy. No pelvic sidewall lymphadenopathy. Reproductive: Unremarkable by CT. Other: No ascites.  No free air. Musculoskeletal: No acute bone finding. No destructive bone process. Spinal degenerative changes. IMPRESSION: 1. No acute findings in the abdomen or pelvis. 2. Lobular hepatic contours with signs of hepatic steatosis. Correlate with any clinical or laboratory evidence of liver disease. 3. 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 4. Nephrolithiasis in the upper pole the RIGHT kidney similar to prior imaging. 5. Aortic atherosclerosis. Aortic Atherosclerosis (ICD10-I70.0). Electronically Signed   By: Zetta Bills M.D.   On: 11/04/2021 14:11   US Abdomen Limited RUQ (LIVER/GB)  Result Date: 11/04/2021 CLINICAL DATA:  Right upper quadrant pain EXAM: ULTRASOUND ABDOMEN LIMITED RIGHT UPPER QUADRANT COMPARISON:  CT 06/21/2020 FINDINGS: Gallbladder: Mild gallbladder wall thickening up to 3.5 mm. No gallstones or sludge is visualized. A positive Murphy sign was reported by the sonographer. Common bile duct: Diameter: 15 mm. Liver: No focal lesion identified. Diffusely increased hepatic parenchymal echogenicity. Portal vein is patent on color Doppler imaging with normal direction of blood flow towards the liver. Other: None. Sonographer reported technically limited exam secondary to poor penetration related to patient body habitus. IMPRESSION: 1. Mild gallbladder wall thickening with a positive sonographic Murphy sign. Although no gallstones are visualized, findings are suspicious for acute cholecystitis. 2. The echogenicity of the liver is increased. This is a nonspecific finding but is most commonly seen with fatty infiltration of the liver. There are no obvious focal liver lesions. 3. Dilated common bile duct measuring up to 15 mm. Correlate with liver function tests.  Electronically Signed   By: Davina Poke D.O.   On: 11/04/2021 11:50    EKG: Independently reviewed.  A flutter 4:1 transduction, no acute ST changes.  Assessment/Plan Principal Problem:   Cholecystitis Active Problems:   Acute cholecystitis  (please populate well all problems here in Problem List. (For example, if patient is on BP meds at home and you resume or decide to hold them, it is a problem that needs to be her. Same for CAD, COPD, HLD and so on)  Acute acalculous cholecystitis -No significant signs of  acute cholangitis at this point -General surgeon consulted for possible cholecystectomy -GI also consulted, defer to GI for further work-up MRCP as her CT abdomen showed no significant CBD dilatation or obstruction. -Continue Zosyn, n.p.o., IV fluid pain control -Trend CBC and liver function  IIDM -With hyperglycemia -Hold off metformin for 3 days as patient received IV contrast today. -Sliding scale for now  Chronic a flutter, rate controlled -Continue rate control regimen of digoxin -Change Eliquis to heparin drip in case patient requiring OR  Restless leg syndrome, HLD, COPD, PPM, TAVR -Stable  DVT prophylaxis: Heparin drip Code Status: Full code Family Communication: Daughter at bedside Disposition Plan: Patient sick with acute cholecystitis requiring IV antibiotics and possible OR, expect more than 2 midnight hospital stay Consults called: General surgery, GI Admission status: MedSurg admission   Lequita Halt MD [facility] Pager [phone]  11/04/2021, 2:26 PM

## 2021-11-05 ENCOUNTER — Inpatient Hospital Stay (HOSPITAL_COMMUNITY): Payer: Medicare Other

## 2021-11-05 DIAGNOSIS — I10 Essential (primary) hypertension: Secondary | ICD-10-CM | POA: Diagnosis not present

## 2021-11-05 DIAGNOSIS — R0602 Shortness of breath: Secondary | ICD-10-CM | POA: Diagnosis not present

## 2021-11-05 DIAGNOSIS — Z950 Presence of cardiac pacemaker: Secondary | ICD-10-CM | POA: Diagnosis not present

## 2021-11-05 DIAGNOSIS — K7689 Other specified diseases of liver: Secondary | ICD-10-CM | POA: Diagnosis not present

## 2021-11-05 DIAGNOSIS — R1011 Right upper quadrant pain: Secondary | ICD-10-CM | POA: Diagnosis not present

## 2021-11-05 DIAGNOSIS — Z7984 Long term (current) use of oral hypoglycemic drugs: Secondary | ICD-10-CM | POA: Diagnosis not present

## 2021-11-05 DIAGNOSIS — R6889 Other general symptoms and signs: Secondary | ICD-10-CM | POA: Diagnosis not present

## 2021-11-05 DIAGNOSIS — J4520 Mild intermittent asthma, uncomplicated: Secondary | ICD-10-CM | POA: Diagnosis not present

## 2021-11-05 DIAGNOSIS — R1031 Right lower quadrant pain: Secondary | ICD-10-CM | POA: Diagnosis not present

## 2021-11-05 DIAGNOSIS — M069 Rheumatoid arthritis, unspecified: Secondary | ICD-10-CM | POA: Diagnosis not present

## 2021-11-05 DIAGNOSIS — Z743 Need for continuous supervision: Secondary | ICD-10-CM | POA: Diagnosis not present

## 2021-11-05 DIAGNOSIS — K429 Umbilical hernia without obstruction or gangrene: Secondary | ICD-10-CM | POA: Diagnosis not present

## 2021-11-05 DIAGNOSIS — Z823 Family history of stroke: Secondary | ICD-10-CM | POA: Diagnosis not present

## 2021-11-05 DIAGNOSIS — Z888 Allergy status to other drugs, medicaments and biological substances status: Secondary | ICD-10-CM | POA: Diagnosis not present

## 2021-11-05 DIAGNOSIS — I700 Atherosclerosis of aorta: Secondary | ICD-10-CM | POA: Diagnosis not present

## 2021-11-05 DIAGNOSIS — E785 Hyperlipidemia, unspecified: Secondary | ICD-10-CM | POA: Diagnosis not present

## 2021-11-05 DIAGNOSIS — R319 Hematuria, unspecified: Secondary | ICD-10-CM | POA: Diagnosis not present

## 2021-11-05 DIAGNOSIS — N1330 Unspecified hydronephrosis: Secondary | ICD-10-CM | POA: Diagnosis not present

## 2021-11-05 DIAGNOSIS — I4892 Unspecified atrial flutter: Secondary | ICD-10-CM | POA: Diagnosis not present

## 2021-11-05 DIAGNOSIS — J811 Chronic pulmonary edema: Secondary | ICD-10-CM | POA: Diagnosis not present

## 2021-11-05 DIAGNOSIS — E7800 Pure hypercholesterolemia, unspecified: Secondary | ICD-10-CM | POA: Diagnosis not present

## 2021-11-05 DIAGNOSIS — Z8042 Family history of malignant neoplasm of prostate: Secondary | ICD-10-CM | POA: Diagnosis not present

## 2021-11-05 DIAGNOSIS — E1165 Type 2 diabetes mellitus with hyperglycemia: Secondary | ICD-10-CM | POA: Diagnosis not present

## 2021-11-05 DIAGNOSIS — R0902 Hypoxemia: Secondary | ICD-10-CM | POA: Diagnosis not present

## 2021-11-05 DIAGNOSIS — N10 Acute tubulo-interstitial nephritis: Secondary | ICD-10-CM | POA: Diagnosis not present

## 2021-11-05 DIAGNOSIS — M47816 Spondylosis without myelopathy or radiculopathy, lumbar region: Secondary | ICD-10-CM | POA: Diagnosis not present

## 2021-11-05 DIAGNOSIS — E876 Hypokalemia: Secondary | ICD-10-CM | POA: Diagnosis not present

## 2021-11-05 DIAGNOSIS — R112 Nausea with vomiting, unspecified: Secondary | ICD-10-CM | POA: Diagnosis not present

## 2021-11-05 DIAGNOSIS — Z79899 Other long term (current) drug therapy: Secondary | ICD-10-CM | POA: Diagnosis not present

## 2021-11-05 DIAGNOSIS — R1084 Generalized abdominal pain: Secondary | ICD-10-CM | POA: Diagnosis not present

## 2021-11-05 DIAGNOSIS — N281 Cyst of kidney, acquired: Secondary | ICD-10-CM | POA: Diagnosis not present

## 2021-11-05 DIAGNOSIS — Z8249 Family history of ischemic heart disease and other diseases of the circulatory system: Secondary | ICD-10-CM | POA: Diagnosis not present

## 2021-11-05 DIAGNOSIS — N136 Pyonephrosis: Secondary | ICD-10-CM | POA: Diagnosis not present

## 2021-11-05 DIAGNOSIS — K8020 Calculus of gallbladder without cholecystitis without obstruction: Secondary | ICD-10-CM | POA: Diagnosis not present

## 2021-11-05 DIAGNOSIS — I779 Disorder of arteries and arterioles, unspecified: Secondary | ICD-10-CM

## 2021-11-05 DIAGNOSIS — K219 Gastro-esophageal reflux disease without esophagitis: Secondary | ICD-10-CM | POA: Diagnosis not present

## 2021-11-05 DIAGNOSIS — N390 Urinary tract infection, site not specified: Secondary | ICD-10-CM | POA: Diagnosis not present

## 2021-11-05 DIAGNOSIS — I4819 Other persistent atrial fibrillation: Secondary | ICD-10-CM

## 2021-11-05 DIAGNOSIS — Z952 Presence of prosthetic heart valve: Secondary | ICD-10-CM | POA: Diagnosis not present

## 2021-11-05 DIAGNOSIS — G2581 Restless legs syndrome: Secondary | ICD-10-CM | POA: Diagnosis not present

## 2021-11-05 DIAGNOSIS — E119 Type 2 diabetes mellitus without complications: Secondary | ICD-10-CM | POA: Diagnosis not present

## 2021-11-05 DIAGNOSIS — Z7901 Long term (current) use of anticoagulants: Secondary | ICD-10-CM | POA: Diagnosis not present

## 2021-11-05 DIAGNOSIS — K5900 Constipation, unspecified: Secondary | ICD-10-CM | POA: Diagnosis not present

## 2021-11-05 DIAGNOSIS — R109 Unspecified abdominal pain: Secondary | ICD-10-CM | POA: Diagnosis not present

## 2021-11-05 LAB — GLUCOSE, CAPILLARY
Glucose-Capillary: 140 mg/dL — ABNORMAL HIGH (ref 70–99)
Glucose-Capillary: 136 mg/dL — ABNORMAL HIGH (ref 70–99)
Glucose-Capillary: 101 mg/dL — ABNORMAL HIGH (ref 70–99)

## 2021-11-05 LAB — HEPATIC FUNCTION PANEL
ALT: 20 U/L (ref 0–44)
AST: 18 U/L (ref 15–41)
Albumin: 3.4 g/dL — ABNORMAL LOW (ref 3.5–5.0)
Alkaline Phosphatase: 60 U/L (ref 38–126)
Bilirubin, Direct: 0.2 mg/dL (ref 0.0–0.2)
Indirect Bilirubin: 0.9 mg/dL (ref 0.3–0.9)
Total Bilirubin: 1.1 mg/dL (ref 0.3–1.2)
Total Protein: 6.2 g/dL — ABNORMAL LOW (ref 6.5–8.1)

## 2021-11-05 LAB — CBC
HCT: 37.5 % (ref 36.0–46.0)
Hemoglobin: 12.5 g/dL (ref 12.0–15.0)
MCH: 31.2 pg (ref 26.0–34.0)
MCHC: 33.3 g/dL (ref 30.0–36.0)
MCV: 93.5 fL (ref 80.0–100.0)
Platelets: 156 10*3/uL (ref 150–400)
RBC: 4.01 MIL/uL (ref 3.87–5.11)
RDW: 12.8 % (ref 11.5–15.5)
WBC: 8.3 10*3/uL (ref 4.0–10.5)
nRBC: 0 % (ref 0.0–0.2)

## 2021-11-05 LAB — HEPARIN LEVEL (UNFRACTIONATED)
Heparin Unfractionated: 1.02 IU/mL — ABNORMAL HIGH (ref 0.30–0.70)
Heparin Unfractionated: >1.1 IU/mL — ABNORMAL HIGH (ref 0.30–0.70)

## 2021-11-05 LAB — APTT
aPTT: 96 seconds — ABNORMAL HIGH (ref 24–36)
aPTT: 118 seconds — ABNORMAL HIGH (ref 24–36)

## 2021-11-05 MED ORDER — GADOBUTROL 1 MMOL/ML IV SOLN
8.0000 mL | Freq: Once | INTRAVENOUS | Status: AC | PRN
  Administered 2021-11-05: 8 mL via INTRAVENOUS

## 2021-11-05 MED ORDER — DILTIAZEM HCL ER COATED BEADS 180 MG PO CP24
180.0000 mg | ORAL_CAPSULE | Freq: Every day | ORAL | Status: DC
  Administered 2021-11-05 – 2021-11-06 (×2): 180 mg via ORAL
  Filled 2021-11-05 (×2): qty 1

## 2021-11-05 MED ORDER — DILTIAZEM HCL ER 180 MG PO CP24: 180.0000 mg | ORAL_CAPSULE | Freq: Every day | ORAL | Status: DC

## 2021-11-05 NOTE — Assessment & Plan Note (Signed)
-   Hold Eliquis - Heparin drip for now - Continue Crestor

## 2021-11-05 NOTE — Assessment & Plan Note (Signed)
Blood pressure control - Continue amlodipine, diltiazem

## 2021-11-05 NOTE — Assessment & Plan Note (Signed)
Possible acalculous cholecystitis, possible choledocholithiasis.  Will need further imaging to delineate extent of her gallbladder/liver disease. - Obtain MRCP - Continue antibiotics - Continue heparin infusion - Hold Eliquis - Consult general surgery and gastroenterology

## 2021-11-05 NOTE — Progress Notes (Signed)
[place] for Heparin Indication: atrial fibrillation  Allergies  Allergen Reactions   Bee Venom Shortness Of Breath and Rash   Wasp Venom Other (See Comments) and Anaphylaxis   Apricot Flavor Swelling    Eyes swell shut   Atorvastatin Itching    Eye swelling Other reaction(s): HA    Patient Measurements: Height: 5\' 8"  (172.7 cm) Weight: 81.6 kg (180 lb) IBW/kg (Calculated) : 63.9  Heparin Dosing Weight: 64 kg  Vital Signs: Temp: 97.5 F (36.4 C) (10/06 0800) Temp Source: Oral (10/06 0800) BP: 140/72 (10/06 0800) Pulse Rate: 60 (10/06 0800)  Labs: Recent Labs    11/04/21 1115 11/04/21 2023 11/05/21 0452  HGB 14.2  --  12.5  HCT 41.7  --  37.5  PLT 172  --  156  APTT  --  55* 96*  HEPARINUNFRC  --  0.89* 1.02*  CREATININE 0.88  --   --     Estimated Creatinine Clearance: 63.8 mL/min (by C-G formula based on SCr of 0.88 mg/dL).   Assessment: 76 YO female with medical history significant for Afib on Eliquis PTA who presented with abdominal pain with concerns for cholecystitis. Pharmacy consulted to manage heparin infusion while PTA apixaban on hold for possible OR intervention. CBC stable, no issues with heparin infusion or bleeding reported.    Goal of Therapy:  Heparin level 0.3-0.7 units/ml Heparin level 66-102 units/ml Monitor platelets by anticoagulation protocol: Yes   Plan:  Continue heparin infusion at 1300 units/hr Check heparin level daily while on heparin Continue to monitor H&H and platelets    Thank you for allowing pharmacy to be a part of this patient's care.  Ardyth Harps, PharmD Clinical Pharmacist

## 2021-11-05 NOTE — Assessment & Plan Note (Signed)
-   Continue digoxin, diltiazem - Hold Eliquis - Continue heparin infusion until operative plan is clear

## 2021-11-05 NOTE — Progress Notes (Signed)
Mobility Specialist Progress Note:   11/05/21 1608  Mobility  Activity Dangled on edge of bed  Activity Response Tolerated fair  $Mobility charge 1 Mobility  Level of Assistance Standby assist, set-up cues, supervision of patient - no hands on  Assistive Device None  Mobility Referral Yes   Pt received in bed and agreeable. Upon sitting up complained of lightheadedness and feeling "funny". Deferred further mobility d/t symptoms. Pt left laying in bed with all needs met, call bell in reach, and family in room.   Briana Foster Mobility Specialist-Acute Rehab Secure Chat only

## 2021-11-05 NOTE — Progress Notes (Signed)
ANTICOAGULATION CONSULT NOTE  Pharmacy Consult for Heparin Indication: atrial fibrillation  Allergies  Allergen Reactions   Bee Venom Shortness Of Breath and Rash   Wasp Venom Other (See Comments) and Anaphylaxis   Apricot Flavor Swelling    Eyes swell shut   Atorvastatin Itching    Eye swelling Other reaction(s): HA    Patient Measurements: Height: 5\' 8"  (172.7 cm) Weight: 81.6 kg (180 lb) IBW/kg (Calculated) : 63.9  Heparin Dosing Weight: 64 kg  Vital Signs: Temp: 97.8 F (36.6 C) (10/06 1742) Temp Source: Oral (10/06 1742) BP: 112/59 (10/06 1742) Pulse Rate: 63 (10/06 1742)  Labs: Recent Labs    11/04/21 1115 11/04/21 2023 11/05/21 0452 11/05/21 1614  HGB 14.2  --  12.5  --   HCT 41.7  --  37.5  --   PLT 172  --  156  --   APTT  --  55* 96* 118*  HEPARINUNFRC  --  0.89* 1.02* >1.10*  CREATININE 0.88  --   --   --      Estimated Creatinine Clearance: 63.8 mL/min (by C-G formula based on SCr of 0.88 mg/dL).   Assessment: 75 YO female with medical history significant for Afib on Eliquis PTA who presented with abdominal pain with concerns for cholecystitis. Pharmacy consulted to manage heparin infusion while PTA apixaban on hold for possible OR intervention. CBC stable, no issues with heparin infusion or bleeding reported.   PTT came back supratherapeutic this PM. We will decrease the rate and check a level in AM.  Goal of Therapy:  Heparin level 0.3-0.7 units/ml Heparin level 66-102 units/ml Monitor platelets by anticoagulation protocol: Yes   Plan:  Decrease heparin infusion to 1200 units/hr PTT/HL in Am Check heparin level daily while on heparin Continue to monitor H&H and platelets    Onnie Boer, PharmD, BCIDP, AAHIVP, CPP Infectious Disease Pharmacist 11/05/2021 5:59 PM

## 2021-11-05 NOTE — TOC Initial Note (Signed)
Transition of Care [facility]) - Initial/Assessment Note    Patient Details  Name: Briana Foster MRN: [id number] Date of Birth: December 07, 1945  Transition of Care [facility] CM/SW Contact:    Curlene Labrum, RN Phone Number: 11/05/2021, 3:55 PM  Clinical Narrative:                 Cm met with the patient at the bedside to present Medicare Observation notice and change of inpatient status to observation per Utilization Review team.  The patient was admitted for abdominal pain and r/o choledocholithiasis and return from MRI today.    DME - Patient has rollator and can at home.  Transportation is provided by the patient and her daughter when patient is unable to drive.  The patient lives with her daughter, who is present in the room at this time.  PCP - Dr. Lysle Rubens.  [facility].  CM will continue to follow the patient for discharge needs but no needs at this time.  Expected Discharge Plan: Home/Self Care Barriers to Discharge: Continued Medical Work up   Patient Goals and CMS Choice Patient states their goals for this hospitalization and ongoing recovery are:: To get better and return home CMS Medicare.gov Compare Post Acute Care list provided to:: Patient Choice offered to / list presented to : Patient  Expected Discharge Plan and Services Expected Discharge Plan: Home/Self Care   Discharge Planning Services: CM Consult   Living arrangements for the past 2 months: Single Family Home                                      Prior Living Arrangements/Services Living arrangements for the past 2 months: Single Family Home Lives with:: Adult Children Patient language and need for interpreter reviewed:: Yes Do you feel safe going back to the place where you live?: Yes      Need for Family Participation in Patient Care: Yes (Comment) Care giver support system in place?: Yes (comment) Current home services: DME (Cane and rollator at the  home) Criminal Activity/Legal Involvement Pertinent to Current Situation/Hospitalization: No - Comment as needed  Activities of Daily Living Home Assistive Devices/Equipment: None ADL Screening (condition at time of admission) Patient's cognitive ability adequate to safely complete daily activities?: Yes Is the patient deaf or have difficulty hearing?: Yes Does the patient have difficulty seeing, even when wearing glasses/contacts?: No Does the patient have difficulty concentrating, remembering, or making decisions?: No Patient able to express need for assistance with ADLs?: Yes Does the patient have difficulty dressing or bathing?: No Independently performs ADLs?: Yes (appropriate for developmental age) Does the patient have difficulty walking or climbing stairs?: No Weakness of Legs: None Weakness of Arms/Hands: None  Permission Sought/Granted Permission sought to share information with : Case Manager Permission granted to share information with : Yes, Verbal Permission Granted        Permission granted to share info w Relationship: daughter - Hannah Beat - [phone]     Emotional Assessment Appearance:: Appears stated age Attitude/Demeanor/Rapport: Gracious Affect (typically observed): Accepting Orientation: : Oriented to Self, Oriented to Place, Oriented to  Time, Oriented to Situation Alcohol / Substance Use: Not Applicable Psych Involvement: No (comment)  Admission diagnosis:  Acalculous cholecystitis [K81.9] Cholecystitis [K81.9] Abdominal pain, right lower quadrant [R10.31] Patient Active Problem List   Diagnosis Date Noted   Hyperlipidemia 11/05/2021   Abdominal pain, right  lower quadrant 11/05/2021   Abdominal pain 11/04/2021   Near syncope 10/27/2020   Atrial fibrillation with slow ventricular response ([place]) 10/27/2020   LBBB (left bundle branch block) 10/27/2020   Aortic stenosis, severe 07/01/2020   Carotid arterial disease (HCC)    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)    Vitamin D deficiency 04/10/2018   Type 2 diabetes mellitus without complication, without long-term current use of insulin ([place]) 04/10/2018   Class 1 obesity with serious comorbidity and body mass index (BMI) of 31.0 to 31.9 in adult 03/26/2018   Essential hypertension 03/26/2018   PCP:  Wenda Low, MD [facility]acy:   [id number] [place], [place] [address] [place] [address] Phone: [phone] Fax: [phone]     Social Determinants of Health (SDOH) Interventions    Readmission Risk Interventions    11/05/2021    3:55 PM  Readmission Risk Prevention Plan  Post Dischage Appt Complete  Medication Screening Complete  Transportation Screening Complete

## 2021-11-05 NOTE — Assessment & Plan Note (Signed)
Not currently on DMARD

## 2021-11-05 NOTE — Plan of Care (Signed)

## 2021-11-05 NOTE — Progress Notes (Signed)
Changed rate on Heparin to 12U. Verified with nurse Lovena Le, LPN

## 2021-11-05 NOTE — Care Plan (Signed)
Called to daughter, no answer, left VM.

## 2021-11-05 NOTE — Progress Notes (Signed)
Subjective/Chief Complaint: Complains right sided pain   Objective: Vital signs in last 24 hours: Temp:  [97.5 F (36.4 C)-99.1 F (37.3 C)] 97.5 F (36.4 C) (10/06 0800) Pulse Rate:  [58-63] 60 (10/06 0800) Resp:  [14-23] 18 (10/06 0800) BP: (110-193)/(69-129) 140/72 (10/06 0800) SpO2:  [87 %-99 %] 91 % (10/06 0800) Weight:  [81.6 kg] 81.6 kg (10/05 1056)    Intake/Output from previous day: 10/05 0701 - 10/06 0700 In: 1050 [IV Piggyback:1050] Out: -  Intake/Output this shift: No intake/output data recorded.  Ab soft obese right sided ab pain more mid abdomen, no murphys sign  Lab Results:  Recent Labs    11/04/21 1115 11/05/21 0452  WBC 11.0* 8.3  HGB 14.2 12.5  HCT 41.7 37.5  PLT 172 156   BMET Recent Labs    11/04/21 1115  NA 138  K 3.5  CL 106  CO2 21*  GLUCOSE 305*  BUN 14  CREATININE 0.88  CALCIUM 9.4   PT/INR No results for input(s): "LABPROT", "INR" in the last 72 hours. ABG No results for input(s): "PHART", "HCO3" in the last 72 hours.  Invalid input(s): "PCO2", "PO2"  Studies/Results: CT ABDOMEN PELVIS W CONTRAST  Result Date: 11/04/2021 CLINICAL DATA:  acute abdominal pain in a 76 year old female. EXAM: CT ABDOMEN AND PELVIS WITH CONTRAST TECHNIQUE: Multidetector CT imaging of the abdomen and pelvis was performed using the standard protocol following bolus administration of intravenous contrast. RADIATION DOSE REDUCTION: This exam was performed according to the departmental dose-optimization program which includes automated exposure control, adjustment of the mA and/or kV according to patient size and/or use of iterative reconstruction technique. CONTRAST:  74mL OMNIPAQUE IOHEXOL 350 MG/ML SOLN COMPARISON:  Chest abdomen and pelvis study of Jun 21, 2020. 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 No pleural effusion. Mitral annular calcification and signs of cardiac  pacer device incompletely evaluated. Hepatobiliary: Lobular hepatic contours with signs of hepatic steatosis. Portal vein is patent. Hepatic veins are patent. Reflux of contrast into hepatic veins. No focal, suspicious hepatic lesion, biliary duct dilation or pericholecystic stranding. Pancreas: Mild atrophy of the pancreas without ductal dilation, inflammation or visible lesion. Spleen: Normal. Adrenals/Urinary Tract: Adrenal glands are normal. Symmetric renal enhancement without hydronephrosis. No ureteral calculi, perivesical stranding or perinephric stranding. No suspicious renal lesion. Nephrolithiasis in the upper pole the RIGHT kidney similar to prior imaging. Stomach/Bowel: Normal appendix. Stomach without adjacent stranding. Small bowel without signs of obstruction or inflammation. Scattered colonic diverticulosis without signs of diverticulitis. Vascular/Lymphatic: Aortic atherosclerosis. No sign of aneurysm. Smooth contour of the IVC. There is no gastrohepatic or hepatoduodenal ligament lymphadenopathy. No retroperitoneal or mesenteric lymphadenopathy. No pelvic sidewall lymphadenopathy. Reproductive: Unremarkable by CT. Other: No ascites.  No free air. Musculoskeletal: No acute bone finding. No destructive bone process. Spinal degenerative changes. IMPRESSION: 1. No acute findings in the abdomen or pelvis. 2. Lobular hepatic contours with signs of hepatic steatosis. Correlate with any clinical or laboratory evidence of liver disease. 3. 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 4. Nephrolithiasis in the upper pole the RIGHT kidney similar to prior imaging. 5. Aortic atherosclerosis. Aortic Atherosclerosis (ICD10-I70.0). Electronically Signed   By: Zetta Bills M.D.   On: 11/04/2021 14:11   US Abdomen Limited RUQ (LIVER/GB)  Result Date: 11/04/2021 CLINICAL DATA:  Right upper quadrant pain EXAM: ULTRASOUND ABDOMEN LIMITED RIGHT UPPER  QUADRANT COMPARISON:  CT 06/21/2020 FINDINGS: Gallbladder:  Mild gallbladder wall thickening up to 3.5 mm. No gallstones or sludge is visualized. A positive Murphy sign was reported by the sonographer. Common bile duct: Diameter: 15 mm. Liver: No focal lesion identified. Diffusely increased hepatic parenchymal echogenicity. Portal vein is patent on color Doppler imaging with normal direction of blood flow towards the liver. Other: None. Sonographer reported technically limited exam secondary to poor penetration related to patient body habitus. IMPRESSION: 1. Mild gallbladder wall thickening with a positive sonographic Murphy sign. Although no gallstones are visualized, findings are suspicious for acute cholecystitis. 2. The echogenicity of the liver is increased. This is a nonspecific finding but is most commonly seen with fatty infiltration of the liver. There are no obvious focal liver lesions. 3. Dilated common bile duct measuring up to 15 mm. Correlate with liver function tests. Electronically Signed   By: Duanne Guess D.O.   On: 11/04/2021 11:50    Anti-infectives: Anti-infectives (From admission, onward)    Start     Dose/Rate Route Frequency Ordered Stop   11/04/21 2000  piperacillin-tazobactam (ZOSYN) IVPB 3.375 g       See Hyperspace for full Linked Orders Report.   3.375 g 12.5 mL/hr over 240 Minutes Intravenous Every 8 hours 11/04/21 1335     11/04/21 1345  piperacillin-tazobactam (ZOSYN) IVPB 3.375 g       See Hyperspace for full Linked Orders Report.   3.375 g 100 mL/hr over 30 Minutes Intravenous  Once 11/04/21 1335 11/04/21 1441       Assessment/Plan: Ab pain, ? Acalculous cholecystitis, dilated cbd -agree with MRCP if can be done -if not HIDA scan -if she has cholecystitis and is acalculous doubt cholecystectomy is best plan given comorbidities. If she has sludge/stones on mrcp will have to get cards to see her as well to determine next step  I reviewed hospitalist notes,  last 24 h vitals and pain scores, last 24 h labs and trends, and last 24 h imaging results.  This care required straight-forward level of medical decision making.     Emelia Loron 11/05/2021

## 2021-11-05 NOTE — Progress Notes (Signed)
  Progress Note   Patient: Briana Foster OHY:[id number] DOB: 11/13/1945 DOA: 11/04/2021     1 DOS: the patient was seen and examined on 11/05/2021 at 8:34 AM      Brief hospital course: Briana Foster is a 76 y.o. F with cAF on Eliquis, AS s/p TAVR s/p PPM, RA not on DMARD, HTN, PVD, DM, and HLD who presented with acute RUQ pain.  In the ER, CT abdomen and pelvis and RUQ US showed thickened gallbladder, no stones, no other acute findings.  General Surgery were consulted who recommended GI evaluation.     Assessment and Plan: * Abdominal pain Possible acalculous cholecystitis, possible choledocholithiasis.  Will need further imaging to delineate extent of her gallbladder/liver disease. - Obtain MRCP - Continue antibiotics - Continue heparin infusion - Hold Eliquis - Consult general surgery and gastroenterology  Hyperlipidemia - Continue Crestor  Carotid arterial disease (HCC) - Hold Eliquis - Heparin drip for now - Continue Crestor  S/P TAVR (transcatheter aortic valve replacement)    Rheumatoid arthritis (HCC) Not currently on DMARD  Persistent atrial fibrillation (HCC) - Continue digoxin, diltiazem - Hold Eliquis - Continue heparin infusion until operative plan is clear  Type 2 diabetes mellitus without complication, without long-term current use of insulin (HCC) - Hold metformin - Sliding scale corrections  Essential hypertension Blood pressure control - Continue amlodipine, diltiazem          Subjective: Patient is uncomfortable, no vomiting, no diarrhea.  No fever.     Physical Exam: BP 136/71 (BP Location: Left Arm)   Pulse 62   Temp (!) 97.5 F (36.4 C) (Oral)   Resp 18   Ht 5\' 8"  (1.727 m)   Wt 81.6 kg   SpO2 91%   BMI 27.37 kg/m   Elderly adult female, lying in bed, appears uncomfortable RRR, no murmurs, no peripheral edema Respiratory rate normal, lungs clear without rales or wheezes Abdomen with mild tenderness on the right,  guarding on the right side, no distention, no rigidity, no rebound Attention normal, affect appropriate, judgment insight appear normal, moves upper extremities with normal strength and coordination, speech fluent, face symmetric     Data Reviewed: Discussed with gastroenterology Comprehensive metabolic panel normal CT the abdomen pelvis shows thickened gallbladder, CBD dilation Right upper sound shows thickened gallbladder, no CBD dilation Complete blood count normal Hemoglobin A1c 8.9% Urinalysis unremarkable    Family Communication: None at the bedside    Disposition: Status is: Inpatient The patient has abdominal pain, possible cholecystitis.  Her testing however at this point is inconclusive, she will need further evaluation to determine the cause as being choledocholithiasis vs cholecystitis        Author: Edwin Dada, MD 11/05/2021 1:13 PM  For on call review [email or web address].

## 2021-11-05 NOTE — Assessment & Plan Note (Signed)
Continue Crestor 

## 2021-11-05 NOTE — Assessment & Plan Note (Signed)
-   Hold metformin - Sliding scale corrections

## 2021-11-05 NOTE — Care Management CC44 (Cosign Needed)
Condition Code 44 Documentation Completed  Patient Details  Name: Briana Foster MRN: [id number] Date of Birth: 12-06-1945   Condition Code 44 given:  Yes Patient signature on Condition Code 44 notice:  Yes Documentation of 2 MD's agreement:  Yes Code 44 added to claim:  Yes    Curlene Labrum, RN 11/05/2021, 3:41 PM

## 2021-11-05 NOTE — Consult Note (Addendum)
[facility] Consult  Referring Provider: [facility] hospitalist Primary Care Physician:  Wenda Low, MD Primary Gastroenterologist: [facility], previously seen by Dr. Earle Gell  Reason for Consultation: Right-sided abdominal pain  HPI: Briana Foster is a 76 y.o. female reports that she was in her usual state of health until 8 AM yesterday morning, while driving she developed sudden onset of right-sided abdominal pain patient reports that she had pain in the right upper quadrant as well as right lower quadrant which became progressively severe and was associated with nausea and vomiting. Patient denies fever, chills or rigors. Patient denies radiation of pain. Pain lasted for almost 5 to 6 hours before she came to the ER and was relieved after pain medications received in the ER. Patient states that the pain has improved since although she continues to have right upper abdominal pain on palpation.  Patient denies prior similar episodes. She has mild acid reflux and takes Tums as needed. She denies difficulty swallowing, pain on swallowing, early satiety, bloating, unintentional weight loss or loss of appetite. Her last bowel movement was yesterday evening, normal as per patient. She denies noticing blood in stool or black stools. She normally has 1-2 bowel movements a day.  Prior GI work-up: Upper GI series 2019, nausea, dysphagia: No evidence of hiatal hernia or reflux, small duodenal diverticulum Colonoscopy 2014, Dr. 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 Angelena Form & Dr. Roxy Manns   Severe aortic stenosis    Type 2 diabetes mellitus without complication, without long-term current use of insulin ([place]) 04/10/2018   Vitamin D deficiency     Past Surgical History:  Procedure Laterality Date   AV NODE ABLATION N/A 07/14/2021   Procedure: AV NODE ABLATION;  Surgeon: Constance Haw, MD;  Location: [facility];  Service: Cardiovascular;  Laterality: N/A;   BUNIONECTOMY  20 yrs ago   bil feet   BUNIONECTOMY  11/30/2011   Procedure: BUNIONECTOMY;  Surgeon: Magnus Sinning, MD;  Location: [facility];  Service: Orthopedics;  Laterality: Bilateral;  RIGHT FOOT EXCISION OF BUNIONETTE AND PARTIAL   PROXIMAL PHALANGECTOMY OF 5TH TOE LEFT FOOT FUNK BUNIONECTOMY,EXCISION OF BUNIONETTE    CARDIOVERSION N/A 11/05/2019   Procedure: CARDIOVERSION;  Surgeon: Lelon Perla, MD;  Location: [facility];  Service: Cardiovascular;  Laterality: N/A;   CARDIOVERSION N/A 12/05/2019   Procedure: CARDIOVERSION;  Surgeon: Elouise Munroe, MD;  Location: [facility];  Service: Cardiovascular;  Laterality: N/A;   COLONOSCOPY WITH PROPOFOL N/A 08/07/2012   Procedure: COLONOSCOPY WITH PROPOFOL;  Surgeon: Garlan Fair, MD;  Location: [facility];  Service: Endoscopy;  Laterality: N/A;   MOUTH SURGERY     teeth  extractions   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   Procedure: PACEMAKER IMPLANT;  Surgeon: Constance Haw, MD;  Location: [facility];  Service: Cardiovascular;  Laterality: N/A;   RIGHT/LEFT HEART CATH AND CORONARY ANGIOGRAPHY N/A 06/17/2020   Procedure: RIGHT/LEFT HEART CATH AND CORONARY ANGIOGRAPHY;  Surgeon: Belva Crome, MD;  Location: [facility];  Service:  Cardiovascular;  Laterality: N/A;   TONSILLECTOMY     TRANSCATHETER AORTIC VALVE REPLACEMENT, TRANSFEMORAL N/A 06/30/2020   Procedure: TRANSCATHETER AORTIC VALVE REPLACEMENT, TRANSFEMORAL;  Surgeon: Burnell Blanks, MD;  Location: [facility];  Service: Open Heart Surgery;  Laterality: N/A;   TUBAL LIGATION     WRIST SURGERY      Prior to Admission medications   Medication Sig Start Date End Date Taking? Authorizing Provider  acetaminophen (TYLENOL) 500 MG tablet Take 500-1,000 mg by mouth every 8 (eight) hours as needed for moderate pain.   Yes [provider]  albuterol (VENTOLIN HFA) 108 (90 Base) MCG/ACT inhaler Inhale 2 puffs into the lungs every 4 (four) hours as needed for wheezing or shortness of breath.   Yes [provider]  amLODipine (NORVASC) 10 MG tablet Take 1 tablet (10 mg total) by mouth daily. 11/03/21  Yes [name], NP  apixaban (ELIQUIS) 5 MG TABS tablet Take 1 tablet (5 mg total) by mouth 2 (two) times daily. 06/18/21  Yes Belva Crome, MD  cetirizine (ZYRTEC) 10 MG tablet Take 10 mg by mouth daily as needed for allergies.   Yes [provider]  Cyanocobalamin (VITAMIN B-12 PO) Take 1 tablet by mouth daily.   Yes [provider]  digoxin (LANOXIN) 0.125 MG tablet Take 1 tablet (0.125 mg total) by mouth daily. 06/11/21  Yes [name], PA-C  diltiazem (DILACOR XR) 180 MG 24 hr capsule Take 180 mg by mouth daily.   Yes [provider]  meclizine (ANTIVERT) 25 MG tablet Take 25 mg by mouth 2 (two) times daily as needed (vertigo). 11/12/20  Yes [provider]  metFORMIN (GLUCOPHAGE-XR) 750 MG 24 hr tablet Take 750 mg by mouth daily. 11/12/20  Yes [provider]  Multiple Vitamins-Minerals (MULTIVITAMIN WITH MINERALS) tablet Take 1 tablet by mouth daily.   Yes [provider]  rOPINIRole (REQUIP) 4 MG tablet Take 4 mg by mouth at bedtime.   Yes [provider]   rosuvastatin (CRESTOR) 20 MG tablet Take 1 tablet (20 mg total) by mouth daily in the afternoon. Patient taking differently: Take 20 mg by mouth every other day. 09/15/20  Yes Belva Crome, MD  metoprolol tartrate (LOPRESSOR) 100 MG tablet Take 1 tablet (100 mg total) by mouth 2 (two) times daily. Patient not taking: Reported on 11/03/2021 06/11/21   [name], PA-C    Current Facility-Administered Medications  Medication Dose Route Frequency Provider Last Rate Last Admin   0.9 %  sodium chloride infusion   Intravenous Continuous Wynetta Fines T, MD 100 mL/hr at 11/04/21 1440 New Bag at 11/04/21 1440   acetaminophen (TYLENOL) tablet 500-1,000 mg  500-1,000 mg Oral Q8H PRN Wynetta Fines T, MD   500 mg at 11/05/21 0330   albuterol (PROVENTIL) (2.5 MG/3ML) 0.083% nebulizer solution 2.5 mg  2.5 mg Inhalation Q4H PRN Wynetta Fines T, MD       amLODipine (NORVASC) tablet 10 mg  10 mg Oral Daily Wynetta Fines T, MD   10 mg at 11/04/21 1434   bisacodyl (DULCOLAX) EC  tablet 5 mg  5 mg Oral Daily PRN Wynetta Fines T, MD       digoxin Fonnie Birkenhead) tablet 0.125 mg  0.125 mg Oral Daily Wynetta Fines T, MD       heparin ADULT infusion 100 units/mL (25000 units/281mL)  1,300 Units/hr Intravenous Continuous Onnie Boer Q, RPH-CPP 13 mL/hr at 11/04/21 2211 1,300 Units/hr at 11/04/21 2211   HYDROmorphone (DILAUDID) injection 0.5-1 mg  0.5-1 mg Intravenous Q2H PRN Wynetta Fines T, MD   1 mg at 11/05/21 0606   insulin aspart (novoLOG) injection 0-15 Units  0-15 Units Subcutaneous TID WC Wynetta Fines T, MD       loratadine (CLARITIN) tablet 10 mg  10 mg Oral Daily Wynetta Fines T, MD   10 mg at 11/04/21 1433   meclizine (ANTIVERT) tablet 25 mg  25 mg Oral BID PRN Lequita Halt, MD       ondansetron [facility]) tablet 4 mg  4 mg Oral Q6H PRN Wynetta Fines T, MD       Or   ondansetron [facility]) injection 4 mg  4 mg Intravenous Q6H PRN Wynetta Fines T, MD       piperacillin-tazobactam (ZOSYN) IVPB 3.375 g  3.375 g Intravenous Q8H  Curatolo, Adam, DO 12.5 mL/hr at 11/05/21 0337 3.375 g at 11/05/21 [id number]   rOPINIRole (REQUIP) tablet 4 mg  4 mg Oral QHS Wynetta Fines T, MD   4 mg at 11/04/21 2122   rosuvastatin (CRESTOR) tablet 20 mg  20 mg Oral Q1500 Wynetta Fines T, MD   20 mg at 11/04/21 1433   senna-docusate (Senokot-S) tablet 1 tablet  1 tablet Oral QHS PRN Lequita Halt, MD        Allergies as of 11/04/2021 - Review Complete 11/04/2021  Allergen Reaction Noted   Bee venom Shortness Of Breath and Rash 03/16/2016   Wasp venom Other (See Comments) and Anaphylaxis 02/21/2020   Apricot flavor Swelling 11/24/2011   Atorvastatin Itching 02/21/2020    Family History  Problem Relation Age of Onset   Heart disease Mother    Stroke Mother    Cancer Father        Prostate    Social History   Socioeconomic History   Marital status: Widowed    Spouse name: Not on file   Number of children: 2   Years of education: Not on file   Highest education level: Not on file  Occupational History   Occupation: Retired-worked at [employer] Use   Smoking status: Never   Smokeless tobacco: Never  Vaping Use   Vaping Use: Never used  Substance and Sexual Activity   Alcohol use: No   Drug use: No   Sexual activity: Not Currently  Other Topics Concern   Not on file  Social History Narrative   Not on file   Social Determinants of Health   Financial Resource Strain: Not on file  Food Insecurity: No Food Insecurity (11/05/2021)   Hunger Vital Sign    Worried About Running Out of Food in the Last Year: Never true    Ran Out of Food in the Last Year: Never true  Transportation Needs: Not on file  Physical Activity: Not on file  Stress: Not on file  Social Connections: Not on file  Intimate Partner Violence: Not on file    Review of Systems:  GI: Described in detail in HPI.    Gen: Denies any fever, chills, rigors, night sweats, anorexia, fatigue, weakness, malaise, involuntary  weight loss, and sleep disorder CV:  Denies chest pain, angina, palpitations, syncope, orthopnea, PND, peripheral edema, and claudication. Resp: Denies dyspnea, cough, sputum, wheezing, coughing up blood. GU : Denies urinary burning, blood in urine, urinary frequency, urinary hesitancy, nocturnal urination, and urinary incontinence. MS: Denies joint pain or swelling.  Denies muscle weakness, cramps, atrophy.  Derm: Denies rash, itching, oral ulcerations, hives, unhealing ulcers.  Psych: Denies depression, anxiety, memory loss, suicidal ideation, hallucinations,  and confusion. Heme: Denies bruising, bleeding, and enlarged lymph nodes. Neuro:  Denies any headaches, dizziness, paresthesias. Endo:  DM,  denies any problems with thyroid, adrenal function.  Physical Exam: Vital signs in last 24 hours: Temp:  [97.5 F (36.4 C)-99.1 F (37.3 C)] 97.5 F (36.4 C) (10/06 0800) Pulse Rate:  [58-63] 60 (10/06 0800) Resp:  [14-23] 18 (10/06 0800) BP: (110-193)/(69-129) 140/72 (10/06 0800) SpO2:  [87 %-99 %] 91 % (10/06 0800) Weight:  [81.6 kg] 81.6 kg (10/05 1056)    General:   Alert,  Well-developed, well-nourished, pleasant and cooperative in NAD Head:  Normocephalic and atraumatic. Eyes:  Sclera clear, no icterus.   Conjunctiva pink. Ears:  Normal auditory acuity. Nose:  No deformity, discharge,  or lesions. Mouth:  No deformity or lesions.  Oropharynx pink & moist. Neck:  Supple; no masses or thyromegaly. Lungs:  Clear throughout to auscultation.   No wheezes, crackles, or rhonchi. No acute distress. Heart:  Regular rate and rhythm; no murmurs, clicks, rubs,  or gallops. Extremities:  Without clubbing or edema. Neurologic:  Alert and  oriented x4;  grossly normal neurologically. Skin:  Intact without significant lesions or rashes. Psych:  Alert and cooperative. Normal mood and affect. Abdomen:  Soft, RUQ tenderness and nondistended. No masses, hepatosplenomegaly or hernias noted. Normal bowel sounds, without guarding, and  without rebound.         Lab Results: Recent Labs    11/04/21 1115 11/05/21 0452  WBC 11.0* 8.3  HGB 14.2 12.5  HCT 41.7 37.5  PLT 172 156   BMET Recent Labs    11/04/21 1115  NA 138  K 3.5  CL 106  CO2 21*  GLUCOSE 305*  BUN 14  CREATININE 0.88  CALCIUM 9.4   LFT Recent Labs    11/05/21 0452  PROT 6.2*  ALBUMIN 3.4*  AST 18  ALT 20  ALKPHOS 60  BILITOT 1.1  BILIDIR 0.2  IBILI 0.9   PT/INR No results for input(s): "LABPROT", "INR" in the last 72 hours.  Studies/Results: CT ABDOMEN PELVIS W CONTRAST  Result Date: 11/04/2021 CLINICAL DATA:  acute abdominal pain in a 76 year old female. EXAM: CT ABDOMEN AND PELVIS WITH CONTRAST TECHNIQUE: Multidetector CT imaging of the abdomen and pelvis was performed using the standard protocol following bolus administration of intravenous contrast. RADIATION DOSE REDUCTION: This exam was performed according to the departmental dose-optimization program which includes automated exposure control, adjustment of the mA and/or kV according to patient size and/or use of iterative reconstruction technique. CONTRAST:  68mL OMNIPAQUE IOHEXOL 350 MG/ML SOLN COMPARISON:  Chest abdomen and pelvis study of Jun 21, 2020. 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 No pleural effusion. Mitral annular calcification and signs of cardiac pacer device incompletely evaluated. Hepatobiliary: Lobular hepatic contours with signs of hepatic steatosis. Portal vein is patent. Hepatic veins are patent. Reflux of contrast into hepatic veins. No focal, suspicious hepatic lesion, biliary duct dilation or pericholecystic stranding. Pancreas: Mild atrophy of the pancreas  without ductal dilation, inflammation or visible lesion. Spleen: Normal. Adrenals/Urinary Tract: Adrenal glands are normal. Symmetric renal enhancement without hydronephrosis. No ureteral calculi, perivesical stranding or  perinephric stranding. No suspicious renal lesion. Nephrolithiasis in the upper pole the RIGHT kidney similar to prior imaging. Stomach/Bowel: Normal appendix. Stomach without adjacent stranding. Small bowel without signs of obstruction or inflammation. Scattered colonic diverticulosis without signs of diverticulitis. Vascular/Lymphatic: Aortic atherosclerosis. No sign of aneurysm. Smooth contour of the IVC. There is no gastrohepatic or hepatoduodenal ligament lymphadenopathy. No retroperitoneal or mesenteric lymphadenopathy. No pelvic sidewall lymphadenopathy. Reproductive: Unremarkable by CT. Other: No ascites.  No free air. Musculoskeletal: No acute bone finding. No destructive bone process. Spinal degenerative changes. IMPRESSION: 1. No acute findings in the abdomen or pelvis. 2. Lobular hepatic contours with signs of hepatic steatosis. Correlate with any clinical or laboratory evidence of liver disease. 3. 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 4. Nephrolithiasis in the upper pole the RIGHT kidney similar to prior imaging. 5. Aortic atherosclerosis. Aortic Atherosclerosis (ICD10-I70.0). Electronically Signed   By: Zetta Bills M.D.   On: 11/04/2021 14:11   US Abdomen Limited RUQ (LIVER/GB)  Result Date: 11/04/2021 CLINICAL DATA:  Right upper quadrant pain EXAM: ULTRASOUND ABDOMEN LIMITED RIGHT UPPER QUADRANT COMPARISON:  CT 06/21/2020 FINDINGS: Gallbladder: Mild gallbladder wall thickening up to 3.5 mm. No gallstones or sludge is visualized. A positive Murphy sign was reported by the sonographer. Common bile duct: Diameter: 15 mm. Liver: No focal lesion identified. Diffusely increased hepatic parenchymal echogenicity. Portal vein is patent on color Doppler imaging with normal direction of blood flow towards the liver. Other: None. Sonographer reported technically limited exam secondary to poor penetration related to patient body habitus.  IMPRESSION: 1. Mild gallbladder wall thickening with a positive sonographic Murphy sign. Although no gallstones are visualized, findings are suspicious for acute cholecystitis. 2. The echogenicity of the liver is increased. This is a nonspecific finding but is most commonly seen with fatty infiltration of the liver. There are no obvious focal liver lesions. 3. Dilated common bile duct measuring up to 15 mm. Correlate with liver function tests. Electronically Signed   By: Davina Poke D.O.   On: 11/04/2021 11:50    Impression: Right upper quadrant abdominal pain, nausea, vomiting  Ultrasound: Mild gallbladder wall thickening with a positive sonographic Murphy sign, no gallstones but findings suspicious for acute cholecystitis Dilated CBD of 15 mm  CT abdomen pelvis with contrast: Hepatic steatosis, no biliary ductal dilatation or pericholecystic stranding  Normal LFTs-T. bili 1.1, AST 18, ALT 20, ALP 60, normal lipase 28 No leukocytosis, normal hemoglobin and platelet  Multiple comorbidities: A-fib on Eliquis, severe aortic stenosis status post TAVR, hypertension, dyslipidemia, diabetes, AV node ablation, permanent pacemaker in place  Plan: Possible acalculous cholecystitis, CBD dilation noted on ultrasound not appreciated on CAT scan. Patient scheduled for MRCP without contrast, if CBD abnormality noted, will need GI involvement. If patient noted to have acalculus cholecystitis, will benefit from cholecystectomy. Currently she remains without pain, Eliquis is on hold, she is n.p.o. and started on IV heparin and IV Zosyn empirically. If MRCP cannot be done, recommend HIDA with EF as the next step. Discussed the same with patient's hospitalist Dr. Loleta Books.   LOS: 1 day   Ronnette Juniper, MD  11/05/2021, 8:32 AM

## 2021-11-05 NOTE — Care Management Obs Status (Cosign Needed)
[facility] NOTIFICATION   Patient Details  Name: Briana Foster MRN: [id number] Date of Birth: 06/06/1945   Medicare Observation Status Notification Given:  Yes    Curlene Labrum, RN 11/05/2021, 3:41 PM

## 2021-11-05 NOTE — Progress Notes (Signed)
Briana Foster for heparin Indication: atrial fibrillation  Allergies  Allergen Reactions   Bee Venom Shortness Of Breath and Rash   Wasp Venom Other (See Comments) and Anaphylaxis   Apricot Flavor Swelling    Eyes swell shut   Atorvastatin Itching    Eye swelling Other reaction(s): HA    Patient Measurements: Height: 5\' 8"  (172.7 cm) Weight: 81.6 kg (180 lb) IBW/kg (Calculated) : 63.9 Heparin Dosing Weight: 80kg  Vital Signs: Temp: 99.1 F (37.3 C) (10/06 0532) Temp Source: Oral (10/05 2359) BP: 152/76 (10/06 0532) Pulse Rate: 58 (10/06 0532)  Labs: Recent Labs    11/04/21 1115 11/04/21 2023 11/05/21 0452  HGB 14.2  --  12.5  HCT 41.7  --  37.5  PLT 172  --  156  APTT  --  55* 96*  HEPARINUNFRC  --  0.89* 1.02*  CREATININE 0.88  --   --      Estimated Creatinine Clearance: 63.8 mL/min (by C-G formula based on SCr of 0.88 mg/dL).   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 Angelena Form & Dr. Roxy Manns   Severe aortic stenosis    Type 2 diabetes mellitus without complication, without long-term current use of insulin ([place]) 04/10/2018   Vitamin D deficiency    Assessment: 22 YOF presenting with abdominal pain, concern for cholecystitis, hx of afib on Eliquis (last dose 10/4)  10/6 AM update:  aPTT therapeutic after rate increase  Goal  of Therapy:  Heparin level 0.3-0.7 units/ml aPTT 66-102 seconds Monitor platelets by anticoagulation protocol: Yes   Plan:  Cont heparin 1300 units/hr 1400 aPTT and heparin level F/u intervention plans and ability to transition back to [place], PharmD, [place] Pharmacist Phone: [phone]

## 2021-11-05 NOTE — Hospital Course (Addendum)
Mrs. Briana Foster is a 76 y.o. F with cAF on Eliquis, AS s/p TAVR s/p PPM, RA not on DMARD, HTN, PVD, DM, and HLD who presented with acute RUQ pain.  In the ER, CT abdomen and pelvis and RUQ US showed thickened gallbladder, no stones, no other acute findings.  General Surgery were consulted who recommended GI evaluation.

## 2021-11-06 DIAGNOSIS — R112 Nausea with vomiting, unspecified: Secondary | ICD-10-CM | POA: Diagnosis not present

## 2021-11-06 DIAGNOSIS — R1011 Right upper quadrant pain: Secondary | ICD-10-CM | POA: Diagnosis not present

## 2021-11-06 DIAGNOSIS — R1031 Right lower quadrant pain: Secondary | ICD-10-CM | POA: Diagnosis not present

## 2021-11-06 LAB — COMPREHENSIVE METABOLIC PANEL
ALT: 23 U/L (ref 0–44)
AST: 19 U/L (ref 15–41)
Albumin: 3.4 g/dL — ABNORMAL LOW (ref 3.5–5.0)
Alkaline Phosphatase: 57 U/L (ref 38–126)
Anion gap: 11 (ref 5–15)
BUN: 14 mg/dL (ref 8–23)
CO2: 24 mmol/L (ref 22–32)
Calcium: 9.4 mg/dL (ref 8.9–10.3)
Chloride: 105 mmol/L (ref 98–111)
Creatinine, Ser: 1.08 mg/dL — ABNORMAL HIGH (ref 0.44–1.00)
GFR, Estimated: 54 mL/min — ABNORMAL LOW (ref >60)
Glucose, Bld: 170 mg/dL — ABNORMAL HIGH (ref 70–99)
Potassium: 3.8 mmol/L (ref 3.5–5.1)
Sodium: 140 mmol/L (ref 135–145)
Total Bilirubin: 0.7 mg/dL (ref 0.3–1.2)
Total Protein: 6.4 g/dL — ABNORMAL LOW (ref 6.5–8.1)

## 2021-11-06 LAB — CBC
HCT: 36.8 % (ref 36.0–46.0)
Hemoglobin: 12.3 g/dL (ref 12.0–15.0)
MCH: 31.6 pg (ref 26.0–34.0)
MCHC: 33.4 g/dL (ref 30.0–36.0)
MCV: 94.6 fL (ref 80.0–100.0)
Platelets: 155 10*3/uL (ref 150–400)
RBC: 3.89 MIL/uL (ref 3.87–5.11)
RDW: 12.7 % (ref 11.5–15.5)
WBC: 7.2 10*3/uL (ref 4.0–10.5)
nRBC: 0 % (ref 0.0–0.2)

## 2021-11-06 LAB — GLUCOSE, CAPILLARY
Glucose-Capillary: 160 mg/dL — ABNORMAL HIGH (ref 70–99)
Glucose-Capillary: 216 mg/dL — ABNORMAL HIGH (ref 70–99)

## 2021-11-06 LAB — APTT: aPTT: 111 seconds — ABNORMAL HIGH (ref 24–36)

## 2021-11-06 LAB — HEPARIN LEVEL (UNFRACTIONATED): Heparin Unfractionated: >1.1 IU/mL — ABNORMAL HIGH (ref 0.30–0.70)

## 2021-11-06 MED ORDER — APIXABAN 5 MG PO TABS
5.0000 mg | ORAL_TABLET | Freq: Two times a day (BID) | ORAL | Status: DC
  Administered 2021-11-06: 5 mg via ORAL
  Filled 2021-11-06: qty 1

## 2021-11-06 NOTE — Plan of Care (Signed)
  Problem: Coping: Goal: Ability to adjust to condition or change in health will improve Outcome: Progressing   Problem: Fluid Volume: Goal: Ability to maintain a balanced intake and output will improve Outcome: Progressing   Problem: Nutritional: Goal: Maintenance of adequate nutrition will improve Outcome: Progressing   Problem: Skin Integrity: Goal: Risk for impaired skin integrity will decrease Outcome: Progressing   

## 2021-11-06 NOTE — Discharge Summary (Signed)
Physician Discharge Summary   Patient: Briana Foster MRN: [id number] DOB: 1945/02/18  Admit date:     11/04/2021  Discharge date: 11/06/21  Discharge Physician: Edwin Dada   PCP: Wenda Low, MD     Recommendations at discharge:  Follow up with PCP Dr. Lysle Rubens in 1 week     Discharge Diagnoses: Principal Problem:   Abdominal pain suspect musculoskeletal Active Problems:   Essential hypertension   Type 2 diabetes mellitus without complication, without long-term current use of insulin (HCC)   Persistent atrial fibrillation (HCC)   Rheumatoid arthritis (HCC)   S/P TAVR (transcatheter aortic valve replacement)   Carotid arterial disease (HCC)   Hyperlipidemia     Hospital Course: Mrs. Sherrow is a 76 y.o. F with cAF on Eliquis, AS s/p TAVR s/p PPM, RA not on DMARD, HTN, PVD, DM, and HLD who presented with acute RLQ abdominal pain.  In the ER, US abdomen showed mildly thickened gallbladder, possible Murphy sign, possible CBD dilation.   General Surgery were consulted who recommended GI evaluation and further imaging.   * Abdominal pain, likely oblique muscle strain or other musculoskeletal pain Patient was admitted and underwent CT abdomen that ruled out perforation, ureteral calculi or hydronephrosis, bowel obstruction, or pancreatitis or appendicitis or divericulitis or mass.    CBD normal on CT abdomen and subsequent MRCP showed no evidence of cholecystitis at all, only some small stones, no CBD dilation.  Patient subsequently able to tolerate diet without symptoms, has rare spasms of pain, with walking which resolved in seconds.  WBC normal.  Cholecystitis ruled out.  Suspect a benign cause of her abdominal pain           [facility] was reviewed for this patient prior to discharge.   Consultants:  General Surgery Gastroenterology  Procedures performed:  - US abdomen - CT abdomen and pelvis  -  MRCP  Disposition: Home Diet recommendation:  Discharge Diet Orders (From admission, onward)     Start     Ordered   11/06/21 0000  Diet - low sodium heart healthy        11/06/21 1209             DISCHARGE MEDICATION: Allergies as of 11/06/2021       Reactions   Bee Venom Shortness Of Breath, Rash   Wasp Venom Other (See Comments), Anaphylaxis   Apricot Flavor Swelling   Eyes swell shut   Atorvastatin Itching   Eye swelling Other reaction(s): HA        Medication List     STOP taking these medications    metoprolol tartrate 100 MG tablet Commonly known as: LOPRESSOR       TAKE these medications    acetaminophen 500 MG tablet Commonly known as: TYLENOL Take 500-1,000 mg by mouth every 8 (eight) hours as needed for moderate pain.   albuterol 108 (90 Base) MCG/ACT inhaler Commonly known as: VENTOLIN HFA Inhale 2 puffs into the lungs every 4 (four) hours as needed for wheezing or shortness of breath.   amLODipine 10 MG tablet Commonly known as: NORVASC Take 1 tablet (10 mg total) by mouth daily.   apixaban 5 MG Tabs tablet Commonly known as: Eliquis Take 1 tablet (5 mg total) by mouth 2 (two) times daily.   cetirizine 10 MG tablet Commonly known as: ZYRTEC Take 10 mg by mouth daily as needed for allergies.   digoxin 0.125 MG tablet Commonly known as: LANOXIN  Take 1 tablet (0.125 mg total) by mouth daily.   diltiazem 180 MG 24 hr capsule Commonly known as: DILACOR XR Take 180 mg by mouth daily.   meclizine 25 MG tablet Commonly known as: ANTIVERT Take 25 mg by mouth 2 (two) times daily as needed (vertigo).   metFORMIN 750 MG 24 hr tablet Commonly known as: GLUCOPHAGE-XR Take 750 mg by mouth daily.   multivitamin with minerals tablet Take 1 tablet by mouth daily.   rOPINIRole 4 MG tablet Commonly known as: REQUIP Take 4 mg by mouth at bedtime.   rosuvastatin 20 MG tablet Commonly known as: CRESTOR Take 1 tablet (20 mg total) by mouth  daily in the afternoon. What changed: when to take this   VITAMIN B-12 PO Take 1 tablet by mouth daily.        Follow-up Information  Georgann HousekeeperKarrar, MD. Schedule an appointment as soon as possible for a visit.   Specialty: Internal Medicine Contact information: [address] [place] [address] [phone]         [facility], [place] Follow up.   Specialty: General Surgery Why: call our office as needed if you have concerns about your gallbladder Contact information: [address] [place] [address] [phone]                Discharge Instructions     Ambulatory referral to Physical Therapy   Complete by: As directed    For right oblique strain   Diet - low sodium heart healthy   Complete by: As directed    Discharge instructions   Complete by: As directed    IMPORTANT DISCHARGE INSTRUCTIONS: You were admitted for abdominal pain Here, your first imaging study was possibly suggestive of an inflamed gallbladder.  Later, more sensitive tests for gallbladder disease showed that this was not the case, your liver, gallbladder were normal, you had no bowel obstruction, kidney stones or other abnormalities of the liver, pancreas, gallbladder or kidneys to explain your symptoms, which is reassuring.  It may have been pain in the muscles of your abdomen.  If this is the case, gentle stretching, regular exercise and a hot pad would likely help It would be reasonable to try seeing a physical therapist.   I have sent a referral. If they haven't called you to set that up by the time you see your primary care doctor, ask them for a PT referral  Go see your primary care in 1 week   Increase activity slowly   Complete by: As directed        Discharge Exam: Filed Weights   11/04/21 1056  Weight: 81.6 kg    General: Pt is alert, awake, not in acute distress Cardiovascular: RRR, nl S1-S2, no  murmurs appreciated.   No LE edema.   Respiratory: Normal respiratory rate and rhythm.  CTAB without rales or wheezes. Abdominal: Abdomen soft and non-tender. On my exam, she has only some mild discomfort when I push on oblique muscles and when I manipulate her pannus. No distension or HSM.   Neuro/Psych: Strength symmetric in upper and lower extremities.  Judgment and insight appear normal.   Condition at discharge: good  The results of significant diagnostics from this hospitalization (including imaging, microbiology, ancillary and laboratory) are listed below for reference.   Imaging Studies: MR ABDOMEN MRCP W WO CONTAST  Result Date: 11/05/2021 CLINICAL DATA:  Inpatient. Right upper quadrant abdominal pain.  Nondiagnostic ultrasound. EXAM: MRI ABDOMEN WITHOUT AND WITH CONTRAST (INCLUDING MRCP) TECHNIQUE: Multiplanar multisequence MR imaging of the abdomen was performed both before and after the administration of intravenous contrast. Heavily T2-weighted images of the biliary and pancreatic ducts were obtained, and three-dimensional MRCP images were rendered by post processing. CONTRAST:  2mL GADAVIST GADOBUTROL 1 MMOL/ML IV SOLN COMPARISON:  11/04/2021 CT abdomen/pelvis and right upper quadrant abdominal sonogram. FINDINGS: Lower chest: No acute abnormality at the lung bases. Hepatobiliary: Normal liver size and configuration. No definite liver surface irregularity. No hepatic steatosis. Simple subcentimeter segment 7 right liver cyst. No suspicious liver masses. Two tiny layering gallstones in the gallbladder, largest 4 mm. No gallbladder wall thickening. No pericholecystic fluid. No intrahepatic biliary ductal dilatation. Common bile duct diameter 4 mm. Small periampullary duodenal diverticulum. No choledocholithiasis. No biliary masses, strictures or beading. Pancreas: No pancreatic mass or duct dilation.  No pancreas divisum. Spleen: Normal size. No mass. Adrenals/Urinary Tract: Normal adrenals.  No hydronephrosis. T1 hyperintense 1.3 cm lower right renal cortical lesion with small hematocrit level and without compelling evidence of enhancement on the motion degraded postcontrast sequences (series 18/image 65), compatible with Bosniak category 2 hemorrhagic/proteinaceous renal cyst. Small 1.0 cm posterior upper left renal cyst with thin internal septation, compatible with Bosniak category 2 renal cyst. Additional scattered subcentimeter simple bilateral renal cysts. No suspicious renal masses. Stomach/Bowel: Normal non-distended stomach. Visualized small and large bowel is normal caliber, with no bowel wall thickening. Vascular/Lymphatic: Atherosclerotic nonaneurysmal abdominal aorta. Patent portal, splenic, hepatic and renal veins. No pathologically enlarged lymph nodes in the abdomen. Other: No abdominal ascites or focal fluid collection. Musculoskeletal: No aggressive appearing focal osseous lesions. IMPRESSION: 1. Cholelithiasis. No gallbladder wall thickening or other MRI findings to suggest acute cholecystitis. No biliary ductal dilatation. No choledocholithiasis. 2. Small Bosniak category 1 and category 2 renal cysts. No suspicious renal masses. Electronically Signed   By: Delbert Phenix M.D.   On: 11/05/2021 15:13   MR 3D Recon At Scanner  Result Date: 11/05/2021 CLINICAL DATA:  Inpatient. Right upper quadrant abdominal pain. Nondiagnostic ultrasound. EXAM: MRI ABDOMEN WITHOUT AND WITH CONTRAST (INCLUDING MRCP) TECHNIQUE: Multiplanar multisequence MR imaging of the abdomen was performed both before and after the administration of intravenous contrast. Heavily T2-weighted images of the biliary and pancreatic ducts were obtained, and three-dimensional MRCP images were rendered by post processing. CONTRAST:  46mL GADAVIST GADOBUTROL 1 MMOL/ML IV SOLN COMPARISON:  11/04/2021 CT abdomen/pelvis and right upper quadrant abdominal sonogram. FINDINGS: Lower chest: No acute abnormality at the lung bases.  Hepatobiliary: Normal liver size and configuration. No definite liver surface irregularity. No hepatic steatosis. Simple subcentimeter segment 7 right liver cyst. No suspicious liver masses. Two tiny layering gallstones in the gallbladder, largest 4 mm. No gallbladder wall thickening. No pericholecystic fluid. No intrahepatic biliary ductal dilatation. Common bile duct diameter 4 mm. Small periampullary duodenal diverticulum. No choledocholithiasis. No biliary masses, strictures or beading. Pancreas: No pancreatic mass or duct dilation.  No pancreas divisum. Spleen: Normal size. No mass. Adrenals/Urinary Tract: Normal adrenals. No hydronephrosis. T1 hyperintense 1.3 cm lower right renal cortical lesion with small hematocrit level and without compelling evidence of enhancement on the motion degraded postcontrast sequences (series 18/image 65), compatible with Bosniak category 2 hemorrhagic/proteinaceous renal cyst. Small 1.0 cm posterior upper left renal cyst with thin internal septation, compatible with Bosniak category 2 renal cyst. Additional scattered subcentimeter simple bilateral renal cysts. No suspicious renal masses. Stomach/Bowel: Normal non-distended stomach. Visualized small and large bowel is normal  caliber, with no bowel wall thickening. Vascular/Lymphatic: Atherosclerotic nonaneurysmal abdominal aorta. Patent portal, splenic, hepatic and renal veins. No pathologically enlarged lymph nodes in the abdomen. Other: No abdominal ascites or focal fluid collection. Musculoskeletal: No aggressive appearing focal osseous lesions. IMPRESSION: 1. Cholelithiasis. No gallbladder wall thickening or other MRI findings to suggest acute cholecystitis. No biliary ductal dilatation. No choledocholithiasis. 2. Small Bosniak category 1 and category 2 renal cysts. No suspicious renal masses. Electronically Signed   By: Delbert Phenix M.D.   On: 11/05/2021 15:13   CT ABDOMEN PELVIS W CONTRAST  Result Date:  11/04/2021 CLINICAL DATA:  acute abdominal pain in a 76 year old female. EXAM: CT ABDOMEN AND PELVIS WITH CONTRAST TECHNIQUE: Multidetector CT imaging of the abdomen and pelvis was performed using the standard protocol following bolus administration of intravenous contrast. RADIATION DOSE REDUCTION: This exam was performed according to the departmental dose-optimization program which includes automated exposure control, adjustment of the mA and/or kV according to patient size and/or use of iterative reconstruction technique. CONTRAST:  52mL OMNIPAQUE IOHEXOL 350 MG/ML SOLN COMPARISON:  Chest abdomen and pelvis study of Jun 21, 2020. 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 No pleural effusion. Mitral annular calcification and signs of cardiac pacer device incompletely evaluated. Hepatobiliary: Lobular hepatic contours with signs of hepatic steatosis. Portal vein is patent. Hepatic veins are patent. Reflux of contrast into hepatic veins. No focal, suspicious hepatic lesion, biliary duct dilation or pericholecystic stranding. Pancreas: Mild atrophy of the pancreas without ductal dilation, inflammation or visible lesion. Spleen: Normal. Adrenals/Urinary Tract: Adrenal glands are normal. Symmetric renal enhancement without hydronephrosis. No ureteral calculi, perivesical stranding or perinephric stranding. No suspicious renal lesion. Nephrolithiasis in the upper pole the RIGHT kidney similar to prior imaging. Stomach/Bowel: Normal appendix. Stomach without adjacent stranding. Small bowel without signs of obstruction or inflammation. Scattered colonic diverticulosis without signs of diverticulitis. Vascular/Lymphatic: Aortic atherosclerosis. No sign of aneurysm. Smooth contour of the IVC. There is no gastrohepatic or hepatoduodenal ligament lymphadenopathy. No retroperitoneal or mesenteric lymphadenopathy. No pelvic sidewall lymphadenopathy.  Reproductive: Unremarkable by CT. Other: No ascites.  No free air. Musculoskeletal: No acute bone finding. No destructive bone process. Spinal degenerative changes. IMPRESSION: 1. No acute findings in the abdomen or pelvis. 2. Lobular hepatic contours with signs of hepatic steatosis. Correlate with any clinical or laboratory evidence of liver disease. 3. 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 4. Nephrolithiasis in the upper pole the RIGHT kidney similar to prior imaging. 5. Aortic atherosclerosis. Aortic Atherosclerosis (ICD10-I70.0). Electronically Signed   By: Donzetta Kohut M.D.   On: 11/04/2021 14:11   US Abdomen Limited RUQ (LIVER/GB)  Result Date: 11/04/2021 CLINICAL DATA:  Right upper quadrant pain EXAM: ULTRASOUND ABDOMEN LIMITED RIGHT UPPER QUADRANT COMPARISON:  CT 06/21/2020 FINDINGS: Gallbladder: Mild gallbladder wall thickening up to 3.5 mm. No gallstones or sludge is visualized. A positive Murphy sign was reported by the sonographer. Common bile duct: Diameter: 15 mm. Liver: No focal lesion identified. Diffusely increased hepatic parenchymal echogenicity. Portal vein is patent on color Doppler imaging with normal direction of blood flow towards the liver. Other: None. Sonographer reported technically limited exam secondary to poor penetration related to patient body habitus. IMPRESSION: 1. Mild gallbladder wall thickening with a positive sonographic Murphy sign. Although no gallstones are visualized, findings are suspicious for acute cholecystitis. 2. The echogenicity of the liver is increased. This is a nonspecific finding but is  most commonly seen with fatty infiltration of the liver. There are no obvious focal liver lesions. 3. Dilated common bile duct measuring up to 15 mm. Correlate with liver function tests. Electronically Signed   By: Duanne Guess D.O.   On: 11/04/2021 11:50    Microbiology: Results for orders placed  or performed during the hospital encounter of 10/27/20  Resp Panel by RT-PCR (Flu A&B, Covid) Nasopharyngeal Swab     Status: None   Collection Time: 10/27/20  6:49 PM   Specimen: Nasopharyngeal Swab; Nasopharyngeal(NP) swabs in vial transport medium  Result Value Ref Range Status   SARS Coronavirus 2 by RT PCR NEGATIVE NEGATIVE Final    Comment: (NOTE) SARS-CoV-2 target nucleic acids are NOT DETECTED.  The SARS-CoV-2 RNA is generally detectable in upper respiratory specimens during the acute phase of infection. The lowest concentration of SARS-CoV-2 viral copies this assay can detect is 138 copies/mL. A negative result does not preclude SARS-Cov-2 infection and should not be used as the sole basis for treatment or other patient management decisions. A negative result may occur with  improper specimen collection/handling, submission of specimen other than nasopharyngeal swab, presence of viral mutation(s) within the areas targeted by this assay, and inadequate number of viral copies(<138 copies/mL). A negative result must be combined with clinical observations, patient history, and epidemiological information. The expected result is Negative.  Fact Sheet for Patients:  BloggerCourse.com  Fact Sheet for Healthcare Providers:  SeriousBroker.it  This test is no t yet approved or cleared by the [place] FDA and  has been authorized for detection and/or diagnosis of SARS-CoV-2 by FDA under an Emergency Use Authorization (EUA). This EUA will remain  in effect (meaning this test can be used) for the duration of the COVID-19 declaration under Section 564(b)(1) of the Act, 21 U.S.C.section 360bbb-3(b)(1), unless the authorization is terminated  or revoked sooner.       Influenza A by PCR NEGATIVE NEGATIVE Final   Influenza B by PCR NEGATIVE NEGATIVE Final    Comment: (NOTE) The Xpert Xpress SARS-CoV-2/FLU/RSV plus assay is intended  as an aid in the diagnosis of influenza from Nasopharyngeal swab specimens and should not be used as a sole basis for treatment. Nasal washings and aspirates are unacceptable for Xpert Xpress SARS-CoV-2/FLU/RSV testing.  Fact Sheet for Patients: BloggerCourse.com  Fact Sheet for Healthcare Providers: SeriousBroker.it  This test is not yet approved or cleared by the [place] FDA and has been authorized for detection and/or diagnosis of SARS-CoV-2 by FDA under an Emergency Use Authorization (EUA). This EUA will remain in effect (meaning this test can be used) for the duration of the COVID-19 declaration under Section 564(b)(1) of the Act, 21 U.S.C. section 360bbb-3(b)(1), unless the authorization is terminated or revoked.  Performed at [facility], [address], [place], [place] [address]     Labs: CBC: Recent Labs  Lab 11/04/21 1115 11/05/21 0452 11/06/21 0348  WBC 11.0* 8.3 7.2  NEUTROABS 9.3*  --   --   HGB 14.2 12.5 12.3  HCT 41.7 37.5 36.8  MCV 93.1 93.5 94.6  PLT 172 156 155   Basic Metabolic Panel: Recent Labs  Lab 11/04/21 1115 11/06/21 0348  NA 138 140  K 3.5 3.8  CL 106 105  CO2 21* 24  GLUCOSE 305* 170*  BUN 14 14  CREATININE 0.88 1.08*  CALCIUM 9.4 9.4   Liver Function Tests: Recent Labs  Lab 11/04/21 1115 11/05/21 0452 11/06/21 0348  AST 20  18 19  ALT 22 20 23   ALKPHOS 65 60 57  BILITOT 0.6 1.1 0.7  PROT 6.9 6.2* 6.4*  ALBUMIN 3.8 3.4* 3.4*   CBG: Recent Labs  Lab 11/05/21 0802 11/05/21 1237 11/05/21 1540 11/06/21 0833 11/06/21 1234  GLUCAP 140* 136* 101* 160* 216*    Discharge time spent: approximately 25 minutes spent on discharge counseling, evaluation of patient on day of discharge, and coordination of discharge planning with nursing, social work, pharmacy and case management  Signed: 01/06/22, MD [facility] 11/06/2021

## 2021-11-06 NOTE — Progress Notes (Signed)
Mobility Specialist Progress Note:   11/06/21 1021  Mobility  Activity Ambulated with assistance in hallway  Activity Response Tolerated well  Distance Ambulated (ft) 200 ft  $Mobility charge 1 Mobility  Level of Assistance Standby assist, set-up cues, supervision of patient - no hands on  Assistive Device Front wheel walker  Mobility Referral Yes   Pt received in bed and agreeable. C/o of fatigue and generalized weakness. Pt left sitting EOB with all needs met and call bell in reach.   Briana Foster Mobility Specialist-Acute Rehab Secure Chat only

## 2021-11-06 NOTE — Progress Notes (Signed)
ANTICOAGULATION CONSULT NOTE - Follow Up Consult  Pharmacy Consult for heparin Indication: atrial fibrillation  Labs: Recent Labs    11/04/21 1115 11/04/21 2023 11/05/21 0452 11/05/21 1614 11/06/21 0348  HGB 14.2  --  12.5  --  12.3  HCT 41.7  --  37.5  --  36.8  PLT 172  --  156  --  155  APTT  --    < > 96* 118* 111*  HEPARINUNFRC  --    < > 1.02* >1.10* >1.10*  CREATININE 0.88  --   --   --  1.08*   < > = values in this interval not displayed.    Assessment: 76yo female remains supratherapeutic on heparin after rate change; no infusion issues or signs of bleeding per RN.  Goal of Therapy:  aPTT 66-102 seconds   Plan:  Will decrease heparin infusion by 1-2 units/kg/hr to 1100 units/hr and check PTT in 8 hours.    Wynona Neat, PharmD, BCPS  11/06/2021,4:41 AM

## 2021-11-06 NOTE — Progress Notes (Signed)
Subjective: Patient complains of nausea but reports improvement in abdominal pain.  She complains of headache.  Objective: Vital signs in last 24 hours: Temp:  [97.5 F (36.4 C)-99.2 F (37.3 C)] 97.7 F (36.5 C) (10/07 0810) Pulse Rate:  [59-86] 63 (10/07 0810) Resp:  [17-18] 17 (10/07 0810) BP: (112-160)/(59-86) 160/83 (10/07 0810) SpO2:  [90 %-94 %] 93 % (10/07 0810) Weight change:     PE: Sitting comfortably on bed not in distress GENERAL: Nonicteric, no pallor  ABDOMEN: Mild upper abdominal, epigastric and right upper quadrant tenderness but normoactive bowel sounds, no rigidity, no guarding EXTREMITIES: No deformity  Lab Results: Results for orders placed or performed during the hospital encounter of 11/04/21 (from the past 48 hour(s))  CBC with Differential     Status: Abnormal   Collection Time: 11/04/21 11:15 AM  Result Value Ref Range   WBC 11.0 (H) 4.0 - 10.5 K/uL   RBC 4.48 3.87 - 5.11 MIL/uL   Hemoglobin 14.2 12.0 - 15.0 g/dL   HCT 41.7 36.0 - 46.0 %   MCV 93.1 80.0 - 100.0 fL   MCH 31.7 26.0 - 34.0 pg   MCHC 34.1 30.0 - 36.0 g/dL   RDW 12.4 11.5 - 15.5 %   Platelets 172 150 - 400 K/uL   nRBC 0.0 0.0 - 0.2 %   Neutrophils Relative % 85 %   Neutro Abs 9.3 (H) 1.7 - 7.7 K/uL   Lymphocytes Relative 9 %   Lymphs Abs 1.0 0.7 - 4.0 K/uL   Monocytes Relative 5 %   Monocytes Absolute 0.6 0.1 - 1.0 K/uL   Eosinophils Relative 0 %   Eosinophils Absolute 0.0 0.0 - 0.5 K/uL   Basophils Relative 1 %   Basophils Absolute 0.1 0.0 - 0.1 K/uL   Immature Granulocytes 0 %   Abs Immature Granulocytes 0.04 0.00 - 0.07 K/uL    Comment: Performed at [facility], [address], [place], [place] [address]  Comprehensive metabolic panel     Status: Abnormal   Collection Time: 11/04/21 11:15 AM  Result Value Ref Range   Sodium 138 135 - 145 mmol/L   Potassium 3.5 3.5 - 5.1 mmol/L   Chloride 106 98 - 111 mmol/L   CO2 21 (L) 22 - 32 mmol/L   Glucose, Bld 305 (H) 70 -  99 mg/dL    Comment: Glucose reference range applies only to samples taken after fasting for at least 8 hours.   BUN 14 8 - 23 mg/dL   Creatinine, Ser 0.88 0.44 - 1.00 mg/dL   Calcium 9.4 8.9 - 10.3 mg/dL   Total Protein 6.9 6.5 - 8.1 g/dL   Albumin 3.8 3.5 - 5.0 g/dL   AST 20 15 - 41 U/L   ALT 22 0 - 44 U/L   Alkaline Phosphatase 65 38 - 126 U/L   Total Bilirubin 0.6 0.3 - 1.2 mg/dL   GFR, Estimated >60 >60 mL/min    Comment: (NOTE) Calculated using the CKD-EPI Creatinine Equation (2021)    Anion gap 11 5 - 15    Comment: Performed at [facility] [address], [place], [place] [address]  Lipase, blood     Status: None   Collection Time: 11/04/21 11:15 AM  Result Value Ref Range   Lipase 28 11 - 51 U/L    Comment: Performed at [facility] [address], [place], [place] [address]  Urinalysis, Routine w reflex microscopic Urine, Clean Catch  Status: Abnormal   Collection Time: 11/04/21 12:40 PM  Result Value Ref Range   Color, Urine YELLOW YELLOW   APPearance HAZY (A) CLEAR   Specific Gravity, Urine 1.014 1.005 - 1.030   pH 5.0 5.0 - 8.0   Glucose, UA >=500 (A) NEGATIVE mg/dL   Hgb urine dipstick LARGE (A) NEGATIVE   Bilirubin Urine NEGATIVE NEGATIVE   Ketones, ur NEGATIVE NEGATIVE mg/dL   Protein, ur 100 (A) NEGATIVE mg/dL   Nitrite NEGATIVE NEGATIVE   Leukocytes,Ua NEGATIVE NEGATIVE   RBC / HPF >50 (H) 0 - 5 RBC/hpf   WBC, UA 6-10 0 - 5 WBC/hpf   Bacteria, UA NONE SEEN NONE SEEN   Squamous Epithelial / LPF 0-5 0 - 5   Mucus PRESENT     Comment: Performed at [facility] [address] [place], [place] [address]  Hemoglobin A1c     Status: Abnormal   Collection Time: 11/04/21  8:23 PM  Result Value Ref Range   Hgb A1c MFr Bld 8.9 (H) 4.8 - 5.6 %    Comment: (NOTE) Pre diabetes:          5.7%-6.4%  Diabetes:              >6.4%  Glycemic control for   <7.0% adults with diabetes    Mean Plasma Glucose 208.73 mg/dL    Comment: Performed  at [facility] [address] [place], [place] [address]  Heparin level (unfractionated)     Status: Abnormal   Collection Time: 11/04/21  8:23 PM  Result Value Ref Range   Heparin Unfractionated 0.89 (H) 0.30 - 0.70 IU/mL    Comment: (NOTE) The clinical reportable range upper limit is being lowered to >1.10 to align with the FDA approved guidance for the current laboratory assay.  If heparin results are below expected values, and patient dosage has  been confirmed, suggest follow up testing of antithrombin III levels. Performed at [facility], [place] [address], [place], [place] [address]   APTT     Status: Abnormal   Collection Time: 11/04/21  8:23 PM  Result Value Ref Range   aPTT 55 (H) 24 - 36 seconds    Comment:        IF BASELINE aPTT IS ELEVATED, SUGGEST PATIENT RISK ASSESSMENT BE USED TO DETERMINE APPROPRIATE ANTICOAGULANT THERAPY. Performed at [facility], [place] [address], [place], [place] [address]   Glucose, capillary     Status: Abnormal   Collection Time: 11/04/21  8:46 PM  Result Value Ref Range   Glucose-Capillary 111 (H) 70 - 99 mg/dL    Comment: Glucose reference range applies only to samples taken after fasting for at least 8 hours.  CBC     Status: None   Collection Time: 11/05/21  4:52 AM  Result Value Ref Range   WBC 8.3 4.0 - 10.5 K/uL   RBC 4.01 3.87 - 5.11 MIL/uL   Hemoglobin 12.5 12.0 - 15.0 g/dL   HCT 37.5 36.0 - 46.0 %   MCV 93.5 80.0 - 100.0 fL   MCH 31.2 26.0 - 34.0 pg   MCHC 33.3 30.0 - 36.0 g/dL   RDW 12.8 11.5 - 15.5 %   Platelets 156 150 - 400 K/uL   nRBC 0.0 0.0 - 0.2 %    Comment: Performed at [facility], [place] [address] [place], [place] [address]  Hepatic function panel     Status: Abnormal  Collection Time: 11/05/21  4:52 AM  Result Value Ref Range   Total Protein 6.2 (L) 6.5 - 8.1 g/dL   Albumin 3.4 (L) 3.5 - 5.0 g/dL   AST 18 15 - 41 U/L   ALT 20 0 - 44 U/L   Alkaline Phosphatase 60 38 - 126 U/L    Total Bilirubin 1.1 0.3 - 1.2 mg/dL   Bilirubin, Direct 0.2 0.0 - 0.2 mg/dL   Indirect Bilirubin 0.9 0.3 - 0.9 mg/dL    Comment: Performed at [facility] [address], [place], [place] [address]  Heparin level (unfractionated)     Status: Abnormal   Collection Time: 11/05/21  4:52 AM  Result Value Ref Range   Heparin Unfractionated 1.02 (H) 0.30 - 0.70 IU/mL    Comment: (NOTE) The clinical reportable range upper limit is being lowered to >1.10 to align with the FDA approved guidance for the current laboratory assay.  If heparin results are below expected values, and patient dosage has  been confirmed, suggest follow up testing of antithrombin III levels. Performed at [facility], [place] [address], [place], [place] [address]   APTT     Status: Abnormal   Collection Time: 11/05/21  4:52 AM  Result Value Ref Range   aPTT 96 (H) 24 - 36 seconds    Comment:        IF BASELINE aPTT IS ELEVATED, SUGGEST PATIENT RISK ASSESSMENT BE USED TO DETERMINE APPROPRIATE ANTICOAGULANT THERAPY. Performed at [facility], [place] [address] [place], [place] [address]   Glucose, capillary     Status: Abnormal   Collection Time: 11/05/21  8:02 AM  Result Value Ref Range   Glucose-Capillary 140 (H) 70 - 99 mg/dL    Comment: Glucose reference range applies only to samples taken after fasting for at least 8 hours.  Glucose, capillary     Status: Abnormal   Collection Time: 11/05/21 12:37 PM  Result Value Ref Range   Glucose-Capillary 136 (H) 70 - 99 mg/dL    Comment: Glucose reference range applies only to samples taken after fasting for at least 8 hours.  Glucose, capillary     Status: Abnormal   Collection Time: 11/05/21  3:40 PM  Result Value Ref Range   Glucose-Capillary 101 (H) 70 - 99 mg/dL    Comment: Glucose reference range applies only to samples taken after fasting for at least 8 hours.  APTT     Status: Abnormal   Collection Time: 11/05/21  4:14 PM  Result Value Ref  Range   aPTT 118 (H) 24 - 36 seconds    Comment:        IF BASELINE aPTT IS ELEVATED, SUGGEST PATIENT RISK ASSESSMENT BE USED TO DETERMINE APPROPRIATE ANTICOAGULANT THERAPY. Performed at [facility], [place] [address] [place], [place] [address]   Heparin level (unfractionated)     Status: Abnormal   Collection Time: 11/05/21  4:14 PM  Result Value Ref Range   Heparin Unfractionated >1.10 (H) 0.30 - 0.70 IU/mL    Comment: (NOTE) The clinical reportable range upper limit is being lowered to >1.10 to align with the FDA approved guidance for the current laboratory assay.  If heparin results are below expected values, and patient dosage has  been confirmed, suggest follow up testing of antithrombin III levels. Performed at [facility], [place] [address], [place], [place] [address]   APTT     Status: Abnormal   Collection Time:  11/06/21  3:48 AM  Result Value Ref Range   aPTT 111 (H) 24 - 36 seconds    Comment:        IF BASELINE aPTT IS ELEVATED, SUGGEST PATIENT RISK ASSESSMENT BE USED TO DETERMINE APPROPRIATE ANTICOAGULANT THERAPY. Performed at [facility], [place] [address], [place], [place] [address]   Heparin level (unfractionated)     Status: Abnormal   Collection Time: 11/06/21  3:48 AM  Result Value Ref Range   Heparin Unfractionated >1.10 (H) 0.30 - 0.70 IU/mL    Comment: (NOTE) The clinical reportable range upper limit is being lowered to >1.10 to align with the FDA approved guidance for the current laboratory assay.  If heparin results are below expected values, and patient dosage has  been confirmed, suggest follow up testing of antithrombin III levels. Performed at [facility], [place] [address], [place] [address]   CBC     Status: None   Collection Time: 11/06/21  3:48 AM  Result Value Ref Range   WBC 7.2 4.0 - 10.5 K/uL   RBC 3.89 3.87 - 5.11 MIL/uL   Hemoglobin 12.3 12.0 - 15.0 g/dL   HCT 36.8 36.0 - 46.0 %   MCV 94.6 80.0 -  100.0 fL   MCH 31.6 26.0 - 34.0 pg   MCHC 33.4 30.0 - 36.0 g/dL   RDW 12.7 11.5 - 15.5 %   Platelets 155 150 - 400 K/uL   nRBC 0.0 0.0 - 0.2 %    Comment: Performed at [facility], [place] [address], [place], [place] [address]  Comprehensive metabolic panel     Status: Abnormal   Collection Time: 11/06/21  3:48 AM  Result Value Ref Range   Sodium 140 135 - 145 mmol/L   Potassium 3.8 3.5 - 5.1 mmol/L   Chloride 105 98 - 111 mmol/L   CO2 24 22 - 32 mmol/L   Glucose, Bld 170 (H) 70 - 99 mg/dL    Comment: Glucose reference range applies only to samples taken after fasting for at least 8 hours.   BUN 14 8 - 23 mg/dL   Creatinine, Ser 1.08 (H) 0.44 - 1.00 mg/dL   Calcium 9.4 8.9 - 10.3 mg/dL   Total Protein 6.4 (L) 6.5 - 8.1 g/dL   Albumin 3.4 (L) 3.5 - 5.0 g/dL   AST 19 15 - 41 U/L   ALT 23 0 - 44 U/L   Alkaline Phosphatase 57 38 - 126 U/L   Total Bilirubin 0.7 0.3 - 1.2 mg/dL   GFR, Estimated 54 (L) >60 mL/min    Comment: (NOTE) Calculated using the CKD-EPI Creatinine Equation (2021)    Anion gap 11 5 - 15    Comment: Performed at [facility], [place] [address] [place], [place] [address]  Glucose, capillary     Status: Abnormal   Collection Time: 11/06/21  8:33 AM  Result Value Ref Range   Glucose-Capillary 160 (H) 70 - 99 mg/dL    Comment: Glucose reference range applies only to samples taken after fasting for at least 8 hours.    Studies/Results: MR ABDOMEN MRCP W WO CONTAST  Result Date: 11/05/2021 CLINICAL DATA:  Inpatient. Right upper quadrant abdominal pain. Nondiagnostic ultrasound. EXAM: MRI ABDOMEN WITHOUT AND WITH CONTRAST (INCLUDING MRCP) TECHNIQUE: Multiplanar multisequence MR imaging of the abdomen was performed both before and after the administration of intravenous contrast. Heavily T2-weighted images of the biliary and pancreatic ducts were obtained, and  three-dimensional MRCP images were rendered by post processing. CONTRAST:  66mL GADAVIST GADOBUTROL 1  MMOL/ML IV SOLN COMPARISON:  11/04/2021 CT abdomen/pelvis and right upper quadrant abdominal sonogram. FINDINGS: Lower chest: No acute abnormality at the lung bases. Hepatobiliary: Normal liver size and configuration. No definite liver surface irregularity. No hepatic steatosis. Simple subcentimeter segment 7 right liver cyst. No suspicious liver masses. Two tiny layering gallstones in the gallbladder, largest 4 mm. No gallbladder wall thickening. No pericholecystic fluid. No intrahepatic biliary ductal dilatation. Common bile duct diameter 4 mm. Small periampullary duodenal diverticulum. No choledocholithiasis. No biliary masses, strictures or beading. Pancreas: No pancreatic mass or duct dilation.  No pancreas divisum. Spleen: Normal size. No mass. Adrenals/Urinary Tract: Normal adrenals. No hydronephrosis. T1 hyperintense 1.3 cm lower right renal cortical lesion with small hematocrit level and without compelling evidence of enhancement on the motion degraded postcontrast sequences (series 18/image 65), compatible with Bosniak category 2 hemorrhagic/proteinaceous renal cyst. Small 1.0 cm posterior upper left renal cyst with thin internal septation, compatible with Bosniak category 2 renal cyst. Additional scattered subcentimeter simple bilateral renal cysts. No suspicious renal masses. Stomach/Bowel: Normal non-distended stomach. Visualized small and large bowel is normal caliber, with no bowel wall thickening. Vascular/Lymphatic: Atherosclerotic nonaneurysmal abdominal aorta. Patent portal, splenic, hepatic and renal veins. No pathologically enlarged lymph nodes in the abdomen. Other: No abdominal ascites or focal fluid collection. Musculoskeletal: No aggressive appearing focal osseous lesions. IMPRESSION: 1. Cholelithiasis. No gallbladder wall thickening or other MRI findings to suggest acute cholecystitis. No biliary ductal dilatation. No choledocholithiasis. 2. Small Bosniak category 1 and category 2 renal  cysts. No suspicious renal masses. Electronically Signed   By: Ilona Sorrel M.D.   On: 11/05/2021 15:13   MR 3D Recon At Scanner  Result Date: 11/05/2021 CLINICAL DATA:  Inpatient. Right upper quadrant abdominal pain. Nondiagnostic ultrasound. EXAM: MRI ABDOMEN WITHOUT AND WITH CONTRAST (INCLUDING MRCP) TECHNIQUE: Multiplanar multisequence MR imaging of the abdomen was performed both before and after the administration of intravenous contrast. Heavily T2-weighted images of the biliary and pancreatic ducts were obtained, and three-dimensional MRCP images were rendered by post processing. CONTRAST:  46mL GADAVIST GADOBUTROL 1 MMOL/ML IV SOLN COMPARISON:  11/04/2021 CT abdomen/pelvis and right upper quadrant abdominal sonogram. FINDINGS: Lower chest: No acute abnormality at the lung bases. Hepatobiliary: Normal liver size and configuration. No definite liver surface irregularity. No hepatic steatosis. Simple subcentimeter segment 7 right liver cyst. No suspicious liver masses. Two tiny layering gallstones in the gallbladder, largest 4 mm. No gallbladder wall thickening. No pericholecystic fluid. No intrahepatic biliary ductal dilatation. Common bile duct diameter 4 mm. Small periampullary duodenal diverticulum. No choledocholithiasis. No biliary masses, strictures or beading. Pancreas: No pancreatic mass or duct dilation.  No pancreas divisum. Spleen: Normal size. No mass. Adrenals/Urinary Tract: Normal adrenals. No hydronephrosis. T1 hyperintense 1.3 cm lower right renal cortical lesion with small hematocrit level and without compelling evidence of enhancement on the motion degraded postcontrast sequences (series 18/image 65), compatible with Bosniak category 2 hemorrhagic/proteinaceous renal cyst. Small 1.0 cm posterior upper left renal cyst with thin internal septation, compatible with Bosniak category 2 renal cyst. Additional scattered subcentimeter simple bilateral renal cysts. No suspicious renal masses.  Stomach/Bowel: Normal non-distended stomach. Visualized small and large bowel is normal caliber, with no bowel wall thickening. Vascular/Lymphatic: Atherosclerotic nonaneurysmal abdominal aorta. Patent portal, splenic, hepatic and renal veins. No pathologically enlarged lymph nodes in the abdomen. Other: No abdominal ascites or focal fluid collection. Musculoskeletal: No aggressive appearing focal osseous lesions.  IMPRESSION: 1. Cholelithiasis. No gallbladder wall thickening or other MRI findings to suggest acute cholecystitis. No biliary ductal dilatation. No choledocholithiasis. 2. Small Bosniak category 1 and category 2 renal cysts. No suspicious renal masses. Electronically Signed   By: Ilona Sorrel M.D.   On: 11/05/2021 15:13   CT ABDOMEN PELVIS W CONTRAST  Result Date: 11/04/2021 CLINICAL DATA:  acute abdominal pain in a 76 year old female. EXAM: CT ABDOMEN AND PELVIS WITH CONTRAST TECHNIQUE: Multidetector CT imaging of the abdomen and pelvis was performed using the standard protocol following bolus administration of intravenous contrast. RADIATION DOSE REDUCTION: This exam was performed according to the departmental dose-optimization program which includes automated exposure control, adjustment of the mA and/or kV according to patient size and/or use of iterative reconstruction technique. CONTRAST:  44mL OMNIPAQUE IOHEXOL 350 MG/ML SOLN COMPARISON:  Chest abdomen and pelvis study of Jun 21, 2020. 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 No pleural effusion. Mitral annular calcification and signs of cardiac pacer device incompletely evaluated. Hepatobiliary: Lobular hepatic contours with signs of hepatic steatosis. Portal vein is patent. Hepatic veins are patent. Reflux of contrast into hepatic veins. No focal, suspicious hepatic lesion, biliary duct dilation or pericholecystic stranding. Pancreas: Mild atrophy of the pancreas  without ductal dilation, inflammation or visible lesion. Spleen: Normal. Adrenals/Urinary Tract: Adrenal glands are normal. Symmetric renal enhancement without hydronephrosis. No ureteral calculi, perivesical stranding or perinephric stranding. No suspicious renal lesion. Nephrolithiasis in the upper pole the RIGHT kidney similar to prior imaging. Stomach/Bowel: Normal appendix. Stomach without adjacent stranding. Small bowel without signs of obstruction or inflammation. Scattered colonic diverticulosis without signs of diverticulitis. Vascular/Lymphatic: Aortic atherosclerosis. No sign of aneurysm. Smooth contour of the IVC. There is no gastrohepatic or hepatoduodenal ligament lymphadenopathy. No retroperitoneal or mesenteric lymphadenopathy. No pelvic sidewall lymphadenopathy. Reproductive: Unremarkable by CT. Other: No ascites.  No free air. Musculoskeletal: No acute bone finding. No destructive bone process. Spinal degenerative changes. IMPRESSION: 1. No acute findings in the abdomen or pelvis. 2. Lobular hepatic contours with signs of hepatic steatosis. Correlate with any clinical or laboratory evidence of liver disease. 3. 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 4. Nephrolithiasis in the upper pole the RIGHT kidney similar to prior imaging. 5. Aortic atherosclerosis. Aortic Atherosclerosis (ICD10-I70.0). Electronically Signed   By: Zetta Bills M.D.   On: 11/04/2021 14:11   US Abdomen Limited RUQ (LIVER/GB)  Result Date: 11/04/2021 CLINICAL DATA:  Right upper quadrant pain EXAM: ULTRASOUND ABDOMEN LIMITED RIGHT UPPER QUADRANT COMPARISON:  CT 06/21/2020 FINDINGS: Gallbladder: Mild gallbladder wall thickening up to 3.5 mm. No gallstones or sludge is visualized. A positive Murphy sign was reported by the sonographer. Common bile duct: Diameter: 15 mm. Liver: No focal lesion identified. Diffusely increased hepatic parenchymal echogenicity.  Portal vein is patent on color Doppler imaging with normal direction of blood flow towards the liver. Other: None. Sonographer reported technically limited exam secondary to poor penetration related to patient body habitus. IMPRESSION: 1. Mild gallbladder wall thickening with a positive sonographic Murphy sign. Although no gallstones are visualized, findings are suspicious for acute cholecystitis. 2. The echogenicity of the liver is increased. This is a nonspecific finding but is most commonly seen with fatty infiltration of the liver. There are no obvious focal liver lesions. 3. Dilated common bile duct measuring up to 15 mm. Correlate with liver function tests. Electronically Signed   By: Davina Poke D.O.  On: 11/04/2021 11:50    Medications: I have reviewed the patient's current medications.  Assessment: Right upper quadrant abdominal pain with normal WBC and normal LFTs MRCP showed cholelithiasis without gallbladder wall thickening or biliary ductal dilatation and no choledocholithiasis  Plan: Okay to continue diet, recommend Zofran as needed for nausea. No pathology noted in CBD, GI will sign off, please recall if needed.  Ronnette Juniper, MD 11/06/2021, 9:42 AM

## 2021-11-07 ENCOUNTER — Inpatient Hospital Stay (HOSPITAL_COMMUNITY)
Admission: EM | Admit: 2021-11-07 | Discharge: 2021-11-09 | DRG: 690 | Disposition: A | Discharge status: Home / Self Care | Payer: Medicare Other | Attending: Internal Medicine | Admitting: Internal Medicine

## 2021-11-07 ENCOUNTER — Other Ambulatory Visit: Payer: Self-pay

## 2021-11-07 ENCOUNTER — Emergency Department (HOSPITAL_COMMUNITY): Payer: Medicare Other

## 2021-11-07 ENCOUNTER — Encounter (HOSPITAL_COMMUNITY): Payer: Self-pay | Admitting: Emergency Medicine

## 2021-11-07 DIAGNOSIS — G2581 Restless legs syndrome: Secondary | ICD-10-CM | POA: Diagnosis present

## 2021-11-07 DIAGNOSIS — Z888 Allergy status to other drugs, medicaments and biological substances status: Secondary | ICD-10-CM

## 2021-11-07 DIAGNOSIS — Z79899 Other long term (current) drug therapy: Secondary | ICD-10-CM

## 2021-11-07 DIAGNOSIS — Z8249 Family history of ischemic heart disease and other diseases of the circulatory system: Secondary | ICD-10-CM

## 2021-11-07 DIAGNOSIS — R1031 Right lower quadrant pain: Principal | ICD-10-CM

## 2021-11-07 DIAGNOSIS — Z8042 Family history of malignant neoplasm of prostate: Secondary | ICD-10-CM

## 2021-11-07 DIAGNOSIS — Z950 Presence of cardiac pacemaker: Secondary | ICD-10-CM

## 2021-11-07 DIAGNOSIS — K219 Gastro-esophageal reflux disease without esophagitis: Secondary | ICD-10-CM | POA: Diagnosis present

## 2021-11-07 DIAGNOSIS — E1165 Type 2 diabetes mellitus with hyperglycemia: Secondary | ICD-10-CM | POA: Diagnosis present

## 2021-11-07 DIAGNOSIS — R319 Hematuria, unspecified: Secondary | ICD-10-CM | POA: Diagnosis not present

## 2021-11-07 DIAGNOSIS — N136 Pyonephrosis: Principal | ICD-10-CM | POA: Diagnosis present

## 2021-11-07 DIAGNOSIS — N1330 Unspecified hydronephrosis: Secondary | ICD-10-CM | POA: Diagnosis not present

## 2021-11-07 DIAGNOSIS — N10 Acute tubulo-interstitial nephritis: Secondary | ICD-10-CM

## 2021-11-07 DIAGNOSIS — M069 Rheumatoid arthritis, unspecified: Secondary | ICD-10-CM | POA: Diagnosis present

## 2021-11-07 DIAGNOSIS — N390 Urinary tract infection, site not specified: Secondary | ICD-10-CM | POA: Diagnosis present

## 2021-11-07 DIAGNOSIS — I4819 Other persistent atrial fibrillation: Secondary | ICD-10-CM | POA: Diagnosis present

## 2021-11-07 DIAGNOSIS — I4892 Unspecified atrial flutter: Secondary | ICD-10-CM | POA: Diagnosis present

## 2021-11-07 DIAGNOSIS — Z823 Family history of stroke: Secondary | ICD-10-CM

## 2021-11-07 DIAGNOSIS — J4520 Mild intermittent asthma, uncomplicated: Secondary | ICD-10-CM | POA: Diagnosis present

## 2021-11-07 DIAGNOSIS — K5900 Constipation, unspecified: Secondary | ICD-10-CM | POA: Diagnosis present

## 2021-11-07 DIAGNOSIS — E7800 Pure hypercholesterolemia, unspecified: Secondary | ICD-10-CM | POA: Diagnosis present

## 2021-11-07 DIAGNOSIS — E119 Type 2 diabetes mellitus without complications: Secondary | ICD-10-CM

## 2021-11-07 DIAGNOSIS — Z7901 Long term (current) use of anticoagulants: Secondary | ICD-10-CM

## 2021-11-07 DIAGNOSIS — I10 Essential (primary) hypertension: Secondary | ICD-10-CM | POA: Diagnosis present

## 2021-11-07 DIAGNOSIS — Z7984 Long term (current) use of oral hypoglycemic drugs: Secondary | ICD-10-CM

## 2021-11-07 DIAGNOSIS — Z952 Presence of prosthetic heart valve: Secondary | ICD-10-CM

## 2021-11-07 DIAGNOSIS — E876 Hypokalemia: Secondary | ICD-10-CM | POA: Diagnosis present

## 2021-11-07 LAB — CBG MONITORING, ED: Glucose-Capillary: 187 mg/dL — ABNORMAL HIGH (ref 70–99)

## 2021-11-07 LAB — URINALYSIS, ROUTINE W REFLEX MICROSCOPIC
Bacteria, UA: NONE SEEN
Bilirubin Urine: NEGATIVE
Glucose, UA: >=500 mg/dL — AB
Ketones, ur: 20 mg/dL — AB
Leukocytes,Ua: NEGATIVE
Nitrite: NEGATIVE
Protein, ur: 100 mg/dL — AB
RBC / HPF: >50 RBC/hpf — ABNORMAL HIGH (ref 0–5)
Specific Gravity, Urine: 1.021 (ref 1.005–1.030)
pH: 5 (ref 5.0–8.0)

## 2021-11-07 LAB — CBC
HCT: 39.6 % (ref 36.0–46.0)
Hemoglobin: 13.5 g/dL (ref 12.0–15.0)
MCH: 32 pg (ref 26.0–34.0)
MCHC: 34.1 g/dL (ref 30.0–36.0)
MCV: 93.8 fL (ref 80.0–100.0)
Platelets: 181 10*3/uL (ref 150–400)
RBC: 4.22 MIL/uL (ref 3.87–5.11)
RDW: 12.4 % (ref 11.5–15.5)
WBC: 9.9 10*3/uL (ref 4.0–10.5)
nRBC: 0 % (ref 0.0–0.2)

## 2021-11-07 LAB — COMPREHENSIVE METABOLIC PANEL
ALT: 27 U/L (ref 0–44)
AST: 27 U/L (ref 15–41)
Albumin: 4.1 g/dL (ref 3.5–5.0)
Alkaline Phosphatase: 64 U/L (ref 38–126)
Anion gap: 11 (ref 5–15)
BUN: 13 mg/dL (ref 8–23)
CO2: 24 mmol/L (ref 22–32)
Calcium: 9.8 mg/dL (ref 8.9–10.3)
Chloride: 107 mmol/L (ref 98–111)
Creatinine, Ser: 0.9 mg/dL (ref 0.44–1.00)
GFR, Estimated: >60 mL/min (ref >60)
Glucose, Bld: 239 mg/dL — ABNORMAL HIGH (ref 70–99)
Potassium: 3.4 mmol/L — ABNORMAL LOW (ref 3.5–5.1)
Sodium: 142 mmol/L (ref 135–145)
Total Bilirubin: 0.8 mg/dL (ref 0.3–1.2)
Total Protein: 7.4 g/dL (ref 6.5–8.1)

## 2021-11-07 LAB — LIPASE, BLOOD: Lipase: 30 U/L (ref 11–51)

## 2021-11-07 MED ORDER — ACETAMINOPHEN 500 MG PO TABS
500.0000 mg | ORAL_TABLET | Freq: Three times a day (TID) | ORAL | Status: DC | PRN
  Administered 2021-11-07: 1000 mg via ORAL
  Filled 2021-11-07: qty 2

## 2021-11-07 MED ORDER — POTASSIUM CHLORIDE CRYS ER 20 MEQ PO TBCR
40.0000 meq | EXTENDED_RELEASE_TABLET | ORAL | Status: AC
  Administered 2021-11-07: 40 meq via ORAL
  Filled 2021-11-07 (×2): qty 2

## 2021-11-07 MED ORDER — ROPINIROLE HCL 1 MG PO TABS
4.0000 mg | ORAL_TABLET | Freq: Every day | ORAL | Status: DC
  Administered 2021-11-07: 4 mg via ORAL
  Filled 2021-11-07 (×2): qty 4

## 2021-11-07 MED ORDER — DILTIAZEM HCL ER 180 MG PO CP24: 180.0000 mg | ORAL_CAPSULE | Freq: Every day | ORAL | Status: DC

## 2021-11-07 MED ORDER — APIXABAN 5 MG PO TABS
5.0000 mg | ORAL_TABLET | Freq: Two times a day (BID) | ORAL | Status: DC
  Administered 2021-11-07 – 2021-11-09 (×4): 5 mg via ORAL
  Filled 2021-11-07 (×4): qty 1

## 2021-11-07 MED ORDER — SODIUM CHLORIDE 0.9 % IV SOLN: INTRAVENOUS | Status: DC

## 2021-11-07 MED ORDER — MECLIZINE HCL 25 MG PO TABS: 25.0000 mg | ORAL_TABLET | Freq: Two times a day (BID) | ORAL | Status: DC | PRN

## 2021-11-07 MED ORDER — FENTANYL CITRATE PF 50 MCG/ML IJ SOSY
50.0000 ug | PREFILLED_SYRINGE | Freq: Once | INTRAMUSCULAR | Status: AC
  Administered 2021-11-07: 50 ug via INTRAVENOUS
  Filled 2021-11-07: qty 1

## 2021-11-07 MED ORDER — ALBUTEROL SULFATE (2.5 MG/3ML) 0.083% IN NEBU
2.5000 mg | INHALATION_SOLUTION | RESPIRATORY_TRACT | Status: DC | PRN
  Administered 2021-11-07: 2.5 mg via RESPIRATORY_TRACT
  Filled 2021-11-07: qty 3

## 2021-11-07 MED ORDER — AMLODIPINE BESYLATE 5 MG PO TABS: 10.0000 mg | ORAL_TABLET | Freq: Every day | ORAL | Status: DC

## 2021-11-07 MED ORDER — AMLODIPINE BESYLATE 10 MG PO TABS
10.0000 mg | ORAL_TABLET | Freq: Every day | ORAL | Status: DC
  Administered 2021-11-07 – 2021-11-09 (×3): 10 mg via ORAL
  Filled 2021-11-07: qty 2
  Filled 2021-11-07 (×2): qty 1

## 2021-11-07 MED ORDER — LORATADINE 10 MG PO TABS
10.0000 mg | ORAL_TABLET | Freq: Every day | ORAL | Status: DC
  Administered 2021-11-07: 10 mg via ORAL
  Filled 2021-11-07 (×3): qty 1

## 2021-11-07 MED ORDER — INSULIN ASPART 100 UNIT/ML IJ SOLN
0.0000 [IU] | Freq: Three times a day (TID) | INTRAMUSCULAR | Status: DC
  Administered 2021-11-07: 4 [IU] via SUBCUTANEOUS
  Administered 2021-11-08: 3 [IU] via SUBCUTANEOUS
  Administered 2021-11-08: 4 [IU] via SUBCUTANEOUS
  Administered 2021-11-08 – 2021-11-09 (×2): 3 [IU] via SUBCUTANEOUS
  Administered 2021-11-09: 4 [IU] via SUBCUTANEOUS

## 2021-11-07 MED ORDER — ALBUTEROL SULFATE HFA 108 (90 BASE) MCG/ACT IN AERS: 2.0000 | INHALATION_SPRAY | RESPIRATORY_TRACT | Status: DC | PRN

## 2021-11-07 MED ORDER — SODIUM CHLORIDE 0.9 % IV BOLUS
500.0000 mL | Freq: Once | INTRAVENOUS | Status: AC
  Administered 2021-11-07: 500 mL via INTRAVENOUS

## 2021-11-07 MED ORDER — INSULIN ASPART 100 UNIT/ML IJ SOLN: 0.0000 [IU] | Freq: Every day | INTRAMUSCULAR | Status: DC

## 2021-11-07 MED ORDER — HYDROMORPHONE HCL 1 MG/ML IJ SOLN
0.5000 mg | INTRAMUSCULAR | Status: DC | PRN
  Administered 2021-11-07: 0.5 mg via INTRAVENOUS
  Filled 2021-11-07: qty 1

## 2021-11-07 MED ORDER — OXYCODONE HCL 5 MG PO TABS
5.0000 mg | ORAL_TABLET | Freq: Four times a day (QID) | ORAL | Status: DC | PRN
  Administered 2021-11-07 – 2021-11-08 (×2): 5 mg via ORAL
  Filled 2021-11-07 (×2): qty 1

## 2021-11-07 MED ORDER — SODIUM CHLORIDE 0.9 % IV SOLN
2.0000 g | INTRAVENOUS | Status: DC
  Administered 2021-11-07 – 2021-11-09 (×3): 2 g via INTRAVENOUS
  Filled 2021-11-07 (×3): qty 20

## 2021-11-07 MED ORDER — ONDANSETRON HCL 4 MG/2ML IJ SOLN
4.0000 mg | Freq: Once | INTRAMUSCULAR | Status: AC
  Administered 2021-11-07: 4 mg via INTRAVENOUS
  Filled 2021-11-07: qty 2

## 2021-11-07 MED ORDER — ONDANSETRON HCL 4 MG/2ML IJ SOLN
4.0000 mg | Freq: Once | INTRAMUSCULAR | Status: AC | PRN
  Administered 2021-11-07: 4 mg via INTRAVENOUS
  Filled 2021-11-07: qty 2

## 2021-11-07 MED ORDER — TAMSULOSIN HCL 0.4 MG PO CAPS
0.4000 mg | ORAL_CAPSULE | Freq: Once | ORAL | Status: AC
  Administered 2021-11-07: 0.4 mg via ORAL
  Filled 2021-11-07: qty 1

## 2021-11-07 MED ORDER — ROSUVASTATIN CALCIUM 20 MG PO TABS
20.0000 mg | ORAL_TABLET | ORAL | Status: DC
  Administered 2021-11-08: 20 mg via ORAL
  Filled 2021-11-07: qty 1

## 2021-11-07 MED ORDER — DILTIAZEM HCL 30 MG PO TABS
60.0000 mg | ORAL_TABLET | Freq: Three times a day (TID) | ORAL | Status: DC
  Administered 2021-11-07 – 2021-11-09 (×6): 60 mg via ORAL
  Filled 2021-11-07 (×3): qty 2
  Filled 2021-11-07: qty 1
  Filled 2021-11-07: qty 2
  Filled 2021-11-07 (×2): qty 1

## 2021-11-07 MED ORDER — DOCUSATE SODIUM 100 MG PO CAPS
100.0000 mg | ORAL_CAPSULE | Freq: Two times a day (BID) | ORAL | Status: DC | PRN
  Administered 2021-11-08: 100 mg via ORAL
  Filled 2021-11-07: qty 1

## 2021-11-07 MED ORDER — DIGOXIN 125 MCG PO TABS: 0.1250 mg | ORAL_TABLET | Freq: Every day | ORAL | Status: DC

## 2021-11-07 MED ORDER — HYDRALAZINE HCL 25 MG PO TABS: 25.0000 mg | ORAL_TABLET | Freq: Four times a day (QID) | ORAL | Status: DC | PRN

## 2021-11-07 MED ORDER — DIGOXIN 125 MCG PO TABS
0.1250 mg | ORAL_TABLET | Freq: Every day | ORAL | Status: DC
  Administered 2021-11-07 – 2021-11-09 (×3): 0.125 mg via ORAL
  Filled 2021-11-07 (×3): qty 1

## 2021-11-07 NOTE — Consult Note (Addendum)
Urology Consult   Physician requesting consult: Dr. Lynden Oxfordhristopher Tegeler, MD  Reason for consult: Unilateral Upper Tract Dilation  History of Present Illness: Briana Foster is a 76 y.o. female with history of HLD, atrial fibrillation s/p TAVR/pacemaker on Eliquis and persistent right sided abdominal pain that started last week. She was admitted to the hospital for possible gallbladder pathology at that time and was evaluated with MRCP and general surgery, GI consults. She did not have an gallstones or sludge found. Her pain improved so she was subsequently discharged yesterday. She presented to the ED this morning with recurrence of her pain. She is afebrile, hypertensive. Labs notable for WBC 9.9, Hgb 13.5, Cr 0.9 (down from 1.08 day prior). Urinalysis without concern for infection, urine culture pending. Subsequent CT renal protocol with mild right hydronephrosis with proximal ureteral dilation without any lesions or stones causing obstruction. Small stone in right upper pole. Radiology read suggests possible recently passed stone.  The patient reports her pain started again last night. It is sharp in nature. The pain medication has helped a little bit. Denies any voiding issues, dysuria, or UTI. Notes saw a urologist a long time ago for UTI but hasn't had one recently. Denies history of stones.   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 Clifton JamesMcAlhany & Dr. Cornelius Moraswen   Severe aortic stenosis    Type 2 diabetes mellitus without complication, without long-term current use of insulin (HCC) 04/10/2018   Vitamin D deficiency     Past Surgical History:  Procedure Laterality Date   AV NODE ABLATION N/A 07/14/2021   Procedure: AV NODE ABLATION;  Surgeon: Regan Lemmingamnitz, Will Martin, MD;  Location: [facility];  Service: Cardiovascular;  Laterality: N/A;   BUNIONECTOMY  20 yrs ago   bil feet   BUNIONECTOMY  11/30/2011   Procedure: BUNIONECTOMY;  Surgeon: Drucilla SchmidtJames P Aplington, MD;  Location: [facility];  Service: Orthopedics;  Laterality: Bilateral;  RIGHT FOOT EXCISION OF BUNIONETTE AND PARTIAL   PROXIMAL PHALANGECTOMY OF 5TH TOE LEFT FOOT FUNK BUNIONECTOMY,EXCISION OF BUNIONETTE    CARDIOVERSION N/A 11/05/2019   Procedure: CARDIOVERSION;  Surgeon: Lewayne Buntingrenshaw, Brian S, MD;  [facility];  Service: Cardiovascular;  Laterality: N/A;   CARDIOVERSION N/A 12/05/2019   Procedure: CARDIOVERSION;  Surgeon: Parke PoissonAcharya, Gayatri A, MD;  [facility];  Service: Cardiovascular;  Laterality: N/A;   COLONOSCOPY WITH PROPOFOL N/A 08/07/2012   Procedure: COLONOSCOPY WITH PROPOFOL;  Surgeon: Charolett BumpersMartin K Johnson, MD;  Location: [facility];  Service: Endoscopy;  Laterality: N/A;   MOUTH SURGERY     teeth extractions   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   Procedure: PACEMAKER IMPLANT;  Surgeon: Regan Lemmingamnitz, Will Martin, MD;  Location: [facility];  Service: Cardiovascular;  Laterality: N/A;   RIGHT/LEFT HEART CATH  AND CORONARY ANGIOGRAPHY N/A 06/17/2020   Procedure: RIGHT/LEFT HEART CATH AND CORONARY ANGIOGRAPHY;  Surgeon: Belva Crome, MD;  Location: [facility];  Service: Cardiovascular;  Laterality: N/A;   TONSILLECTOMY     TRANSCATHETER AORTIC VALVE REPLACEMENT, TRANSFEMORAL N/A 06/30/2020   Procedure: TRANSCATHETER AORTIC VALVE REPLACEMENT, TRANSFEMORAL;  Surgeon: Burnell Blanks, MD;  Location: [place] [facility];   Service: Open Heart Surgery;  Laterality: N/A;   TUBAL LIGATION     WRIST SURGERY      Current Hospital Medications:  Home Meds:  No outpatient medications have been marked as taking for the 11/07/21 encounter [facility] Encounter).    Scheduled Meds: Continuous Infusions: PRN Meds:.  Allergies:  Allergies  Allergen Reactions   Bee Venom Shortness Of Breath and Rash   Wasp Venom Other (See Comments) and Anaphylaxis   Apricot Flavor Swelling    Eyes swell shut   Atorvastatin Itching    Eye swelling Other reaction(s): HA    Family History  Problem Relation Age of Onset   Heart disease Mother    Stroke Mother    Cancer Father        Prostate    Social History:  reports that she has never smoked. She has never used smokeless tobacco. She reports that she does not drink alcohol and does not use drugs.  ROS: A complete review of systems was performed.  All systems are negative except for pertinent findings as noted.  Physical Exam:  Vital signs in last 24 hours: Temp:  [97.8 F (36.6 C)-98.1 F (36.7 C)] 98.1 F (36.7 C) (10/08 1140) Pulse Rate:  [59-78] 59 (10/08 1115) Resp:  [16-20] 17 (10/08 1115) BP: (144-188)/(71-109) 186/90 (10/08 1115) SpO2:  [91 %-100 %] 100 % (10/08 1115)  Constitutional:  Alert and oriented, No acute distress Cardiovascular: Regular rate Respiratory: Normal respiratory effort on room air GI: Abdomen is soft, RLQ TTP, nondistended GU: No CVA tenderness, voiding spontaneously Psychiatric: Normal mood and affect  Laboratory Data:  Recent Labs    11/05/21 0452 11/06/21 0348 11/07/21 0051  WBC 8.3 7.2 9.9  HGB 12.5 12.3 13.5  HCT 37.5 36.8 39.6  PLT 156 155 181    Recent Labs    11/06/21 0348 11/07/21 0051  NA 140 142  K 3.8 3.4*  CL 105 107  GLUCOSE 170* 239*  BUN 14 13  CALCIUM 9.4 9.8  CREATININE 1.08* 0.90     Results for orders placed or performed during the hospital encounter of 11/07/21 (from the past 24 hour(s))   Urinalysis, Routine w reflex microscopic     Status: Abnormal   Collection Time: 11/07/21 12:44 AM  Result Value Ref Range   Color, Urine YELLOW YELLOW   APPearance HAZY (A) CLEAR   Specific Gravity, Urine 1.021 1.005 - 1.030   pH 5.0 5.0 - 8.0   Glucose, UA >=500 (A) NEGATIVE mg/dL   Hgb urine dipstick LARGE (A) NEGATIVE   Bilirubin Urine NEGATIVE NEGATIVE   Ketones, ur 20 (A) NEGATIVE mg/dL   Protein, ur 100 (A) NEGATIVE mg/dL   Nitrite NEGATIVE NEGATIVE   Leukocytes,Ua NEGATIVE NEGATIVE   RBC / HPF >50 (H) 0 - 5 RBC/hpf   WBC, UA 6-10 0 - 5 WBC/hpf   Bacteria, UA NONE SEEN NONE SEEN   Squamous Epithelial / LPF 0-5 0 - 5   Mucus PRESENT   Lipase, blood     Status: None   Collection Time: 11/07/21 12:51 AM  Result  Value Ref Range   Lipase 30 11 - 51 U/L  Comprehensive metabolic panel     Status: Abnormal   Collection Time: 11/07/21 12:51 AM  Result Value Ref Range   Sodium 142 135 - 145 mmol/L   Potassium 3.4 (L) 3.5 - 5.1 mmol/L   Chloride 107 98 - 111 mmol/L   CO2 24 22 - 32 mmol/L   Glucose, Bld 239 (H) 70 - 99 mg/dL   BUN 13 8 - 23 mg/dL   Creatinine, Ser 0.90 0.44 - 1.00 mg/dL   Calcium 9.8 8.9 - 10.3 mg/dL   Total Protein 7.4 6.5 - 8.1 g/dL   Albumin 4.1 3.5 - 5.0 g/dL   AST 27 15 - 41 U/L   ALT 27 0 - 44 U/L   Alkaline Phosphatase 64 38 - 126 U/L   Total Bilirubin 0.8 0.3 - 1.2 mg/dL   GFR, Estimated >60 >60 mL/min   Anion gap 11 5 - 15  CBC     Status: None   Collection Time: 11/07/21 12:51 AM  Result Value Ref Range   WBC 9.9 4.0 - 10.5 K/uL   RBC 4.22 3.87 - 5.11 MIL/uL   Hemoglobin 13.5 12.0 - 15.0 g/dL   HCT 39.6 36.0 - 46.0 %   MCV 93.8 80.0 - 100.0 fL   MCH 32.0 26.0 - 34.0 pg   MCHC 34.1 30.0 - 36.0 g/dL   RDW 12.4 11.5 - 15.5 %   Platelets 181 150 - 400 K/uL   nRBC 0.0 0.0 - 0.2 %   No results found for this or any previous visit (from the past 240 hour(s)).  Renal Function: Recent Labs    11/04/21 1115 11/06/21 0348 11/07/21 0051   CREATININE 0.88 1.08* 0.90   Estimated Creatinine Clearance: 62.4 mL/min (by C-G formula based on SCr of 0.9 mg/dL).  Radiologic Imaging: CT Renal Stone Study  Result Date: 11/07/2021 CLINICAL DATA:  Right flank pain, right hydronephrosis seen in the sonogram EXAM: CT ABDOMEN AND PELVIS WITHOUT CONTRAST TECHNIQUE: Multidetector CT imaging of the abdomen and pelvis was performed following the standard protocol without IV contrast. RADIATION DOSE REDUCTION: This exam was performed according to the departmental dose-optimization program which includes automated exposure control, adjustment of the mA and/or kV according to patient size and/or use of iterative reconstruction technique. COMPARISON:  Previous CT done on 11/04/2021 and sonogram done earlier today FINDINGS: Lower chest: Small patchy infiltrate is seen in left lower lobe. Coronary artery calcifications are seen. There is prosthetic aortic valve. Dense calcification is seen in mitral annulus. Cardiac pacer leads are noted in place. Hepatobiliary: In image 18 of series 3, there is subcentimeter low-density in the right lobe of liver, possibly cysts or hemangiomas. There is no dilation of bile ducts. Gallbladder is not distended. Pancreas: No focal abnormalities are seen. Spleen: Unremarkable. Adrenals/Urinary Tract: Adrenals are unremarkable. There is mild right hydronephrosis. There is mild to moderate dilation of proximal course of right ureter. There is mild right perinephric stranding. There are 2 small calcific densities in the upper pole of right kidney. Possible tiny calcifications are seen in the lower pole of left kidney. There is no demonstrable opaque calculus in the course of the right ureter. There are multiple round calcifications in both sides of pelvis, possibly vascular calcifications. There is no hydronephrosis on the left side. Stomach/Bowel: Stomach is unremarkable. Small bowel loops are not dilated. Appendix is unremarkable. There  is no significant wall thickening in colon. Few diverticula  are seen in colon without signs of focal diverticulitis. Vascular/Lymphatic: Scattered arterial calcifications are seen. Reproductive: Uterus is unremarkable. There is 1.5 cm low-density in right side of pelvis, possibly ovarian follicle. Other: There is no ascites or pneumoperitoneum. Umbilical hernia containing fat is seen. Musculoskeletal: Degenerative changes are noted in lumbar spine with disc space narrowing, bony spurs and facet hypertrophy at multiple levels. There is encroachment of neural foramina from L3-S1 levels. IMPRESSION: There is mild to moderate right hydronephrosis. There is right perinephric stranding. There is no demonstrable opaque calculus in the course of the right ureter. Findings may suggest recent passage of ureteral calculus or non opaque calculus in the distal right ureter. There are few small calcifications in both kidneys each measuring less than 3 mm suggesting renal stones. There is right perinephric stranding which may be due to ureteric obstruction or superimposed infection. There is no evidence of intestinal obstruction or pneumoperitoneum. Appendix is not dilated. Scattered diverticula are seen in colon without signs of focal diverticulitis. Lumbar spondylosis. Small patchy infiltrate in left lower lung fields may suggest atelectasis/pneumonia. Other findings as described in the body of the report. Electronically Signed   By: Elmer Picker M.D.   On: 11/07/2021 12:53   US Pelvis Complete  Result Date: 11/07/2021 CLINICAL DATA:  76 year old female with pelvic pain. EXAM: TRANSABDOMINAL ULTRASOUND OF PELVIS TECHNIQUE: Transabdominal ultrasound examination of the pelvis was performed including evaluation of the uterus, ovaries, adnexal regions, and pelvic cul-de-sac. Patient declined transvaginal exam. COMPARISON:  11/04/2021 CT FINDINGS: Uterus Measurements: Anteverted measuring 7.8 x 1.9 x 3.5 cm = volume: 27  mL. No fibroids or other mass visualized. Endometrium Thickness: 2.3 mm.  No focal abnormality visualized. Right ovary Not visualized.  No adnexal mass. Left ovary Not visualized.  No adnexal mass. Other findings:  No abnormal free fluid. IMPRESSION: 1. Unremarkable uterus. 2. Ovaries not visualized. No free fluid or adnexal mass. Electronically Signed   By: Margarette Canada M.D.   On: 11/07/2021 11:06   US Abdomen Limited RUQ (LIVER/GB)  Result Date: 11/07/2021 CLINICAL DATA:  Right upper quadrant pain. Per technologist report, limited exam due to body habitus. EXAM: ULTRASOUND ABDOMEN LIMITED RIGHT UPPER QUADRANT COMPARISON:  CT abdomen and pelvis November 04, 2021, abdominal ultrasound November 04, 2021 FINDINGS: Gallbladder: At least 1 shadowing gallstone is visualized. No gallbladder wall thickening or pericholecystic fluid. No sonographic Murphy sign noted by sonographer. Common bile duct: Diameter: 4 mm Liver: No focal lesion identified. Increased liver parenchymal echogenicity. Portal vein is patent on color Doppler imaging with normal direction of blood flow towards the liver. Other: Mild right hydronephrosis. IMPRESSION: 1. Cholelithiasis without evidence of acute cholecystitis. 2. Incidentally visualized mild right hydronephrosis. Electronically Signed   By: Beryle Flock M.D.   On: 11/07/2021 11:03   DG Chest 2 View  Result Date: 11/07/2021 CLINICAL DATA:  Shortness of breath EXAM: CHEST - 2 VIEW COMPARISON:  Chest x-ray October 29, 2020, chest CT Jun 22, 2020 FINDINGS: Intact left chest wall single lead pacemaker. Unchanged cardiomediastinal contours. Mild diffuse interstitial pulmonary opacities. There is also a more focal left retrocardiac opacity. No large pleural effusion or pneumothorax. No acute osseous abnormality. The visualized upper abdomen is unremarkable. IMPRESSION: 1. Mild pulmonary edema. 2. There is a more focal left retrocardiac opacity, possibly reflecting edema or atelectasis.  However, aspiration/infection is also possible in the appropriate clinical context. Electronically Signed   By: Beryle Flock M.D.   On: 11/07/2021 09:29   MR ABDOMEN MRCP  W WO CONTAST  Result Date: 11/05/2021 CLINICAL DATA:  Inpatient. Right upper quadrant abdominal pain. Nondiagnostic ultrasound. EXAM: MRI ABDOMEN WITHOUT AND WITH CONTRAST (INCLUDING MRCP) TECHNIQUE: Multiplanar multisequence MR imaging of the abdomen was performed both before and after the administration of intravenous contrast. Heavily T2-weighted images of the biliary and pancreatic ducts were obtained, and three-dimensional MRCP images were rendered by post processing. CONTRAST:  37mL GADAVIST GADOBUTROL 1 MMOL/ML IV SOLN COMPARISON:  11/04/2021 CT abdomen/pelvis and right upper quadrant abdominal sonogram. FINDINGS: Lower chest: No acute abnormality at the lung bases. Hepatobiliary: Normal liver size and configuration. No definite liver surface irregularity. No hepatic steatosis. Simple subcentimeter segment 7 right liver cyst. No suspicious liver masses. Two tiny layering gallstones in the gallbladder, largest 4 mm. No gallbladder wall thickening. No pericholecystic fluid. No intrahepatic biliary ductal dilatation. Common bile duct diameter 4 mm. Small periampullary duodenal diverticulum. No choledocholithiasis. No biliary masses, strictures or beading. Pancreas: No pancreatic mass or duct dilation.  No pancreas divisum. Spleen: Normal size. No mass. Adrenals/Urinary Tract: Normal adrenals. No hydronephrosis. T1 hyperintense 1.3 cm lower right renal cortical lesion with small hematocrit level and without compelling evidence of enhancement on the motion degraded postcontrast sequences (series 18/image 65), compatible with Bosniak category 2 hemorrhagic/proteinaceous renal cyst. Small 1.0 cm posterior upper left renal cyst with thin internal septation, compatible with Bosniak category 2 renal cyst. Additional scattered subcentimeter  simple bilateral renal cysts. No suspicious renal masses. Stomach/Bowel: Normal non-distended stomach. Visualized small and large bowel is normal caliber, with no bowel wall thickening. Vascular/Lymphatic: Atherosclerotic nonaneurysmal abdominal aorta. Patent portal, splenic, hepatic and renal veins. No pathologically enlarged lymph nodes in the abdomen. Other: No abdominal ascites or focal fluid collection. Musculoskeletal: No aggressive appearing focal osseous lesions. IMPRESSION: 1. Cholelithiasis. No gallbladder wall thickening or other MRI findings to suggest acute cholecystitis. No biliary ductal dilatation. No choledocholithiasis. 2. Small Bosniak category 1 and category 2 renal cysts. No suspicious renal masses. Electronically Signed   By: Ilona Sorrel M.D.   On: 11/05/2021 15:13   MR 3D Recon At Scanner  Result Date: 11/05/2021 CLINICAL DATA:  Inpatient. Right upper quadrant abdominal pain. Nondiagnostic ultrasound. EXAM: MRI ABDOMEN WITHOUT AND WITH CONTRAST (INCLUDING MRCP) TECHNIQUE: Multiplanar multisequence MR imaging of the abdomen was performed both before and after the administration of intravenous contrast. Heavily T2-weighted images of the biliary and pancreatic ducts were obtained, and three-dimensional MRCP images were rendered by post processing. CONTRAST:  61mL GADAVIST GADOBUTROL 1 MMOL/ML IV SOLN COMPARISON:  11/04/2021 CT abdomen/pelvis and right upper quadrant abdominal sonogram. FINDINGS: Lower chest: No acute abnormality at the lung bases. Hepatobiliary: Normal liver size and configuration. No definite liver surface irregularity. No hepatic steatosis. Simple subcentimeter segment 7 right liver cyst. No suspicious liver masses. Two tiny layering gallstones in the gallbladder, largest 4 mm. No gallbladder wall thickening. No pericholecystic fluid. No intrahepatic biliary ductal dilatation. Common bile duct diameter 4 mm. Small periampullary duodenal diverticulum. No choledocholithiasis.  No biliary masses, strictures or beading. Pancreas: No pancreatic mass or duct dilation.  No pancreas divisum. Spleen: Normal size. No mass. Adrenals/Urinary Tract: Normal adrenals. No hydronephrosis. T1 hyperintense 1.3 cm lower right renal cortical lesion with small hematocrit level and without compelling evidence of enhancement on the motion degraded postcontrast sequences (series 18/image 65), compatible with Bosniak category 2 hemorrhagic/proteinaceous renal cyst. Small 1.0 cm posterior upper left renal cyst with thin internal septation, compatible with Bosniak category 2 renal cyst. Additional scattered subcentimeter simple bilateral renal  cysts. No suspicious renal masses. Stomach/Bowel: Normal non-distended stomach. Visualized small and large bowel is normal caliber, with no bowel wall thickening. Vascular/Lymphatic: Atherosclerotic nonaneurysmal abdominal aorta. Patent portal, splenic, hepatic and renal veins. No pathologically enlarged lymph nodes in the abdomen. Other: No abdominal ascites or focal fluid collection. Musculoskeletal: No aggressive appearing focal osseous lesions. IMPRESSION: 1. Cholelithiasis. No gallbladder wall thickening or other MRI findings to suggest acute cholecystitis. No biliary ductal dilatation. No choledocholithiasis. 2. Small Bosniak category 1 and category 2 renal cysts. No suspicious renal masses. Electronically Signed   By: Ilona Sorrel M.D.   On: 11/05/2021 15:13    I independently reviewed the above imaging studies.  Impression/Recommendation 76 year old female with history of HLD, atrial fibrillation s/p TAVR/pacemaker on Eliquis who presented with persistent right sided abdominal pain. Afebrile and stable. No leukocytosis, normal renal function. UA without concern for infection. Urine culture pending. CT scan with mild right hydronephrosis with proximal ureteral dilation and perinephric stranding, small non obstructing stone in right kidney, suggesting recently  passed stone. Patient symptomatic with mild right lower quadrant pain. Discussed options of conservative/supportive care vs intervention with stent. Discussed risks and benefits. Will plan for conservative management at this time (flomax, hydration, pain control). Patient is in agreement with plan.  Coralyn Pear, MD [facility] 11/07/2021, 1:16 PM   I have seen and examined the patient and agree with the above assessment and plan.  Pt with mild left lower abdominal pain that has improved. Afebrile. No leukocytosis, UA with neg nitritese and leukocyte esterase. Ucx pending. Discussed options including right ureteral stent placement or conservative treatment with pain control and tamsulosin as CT scan with evidence of likely recently passed ureteral stone. She elects for conservative treatment. Will arrange for f/u in office to obtain followup imaging and if persistent hydronephrosis, would warrant further evaluation with lasix renal scan or possible diagnostic ureteroscopy.  Matt R. [facility]  Pager: [phone]

## 2021-11-07 NOTE — ED Provider Notes (Signed)
[facility] Provider Note   CSN: [id number] Arrival date & time: 11/07/21  0042     History  Chief Complaint  Patient presents with   Abdominal Pain    Briana Foster is a 76 y.o. female with Hx of hypercholesterolemia, GERD, DMT2, A-fib status post TAVR/pacemaker on Eliquis.  Returning to the ED today for continued right-sided abdominal pain.  Was seen on 11/04/2021, admitted to the hospital for further evaluation of possible gallbladder pathology.  [place] had indicated gallbladder wall thickening with positive Murphy sign, however no gallstones or sludge appreciated.  CBD was also dilated, however LFTs and lipase normal.  MRCP was recommended and general surgery/GI were consulted.  Was discharged today, with mildly improved pain, that quickly returned as soon as she got home.  Endorses accompanying N/V described as nonbloody and bilious.  Unable to keep down food or fluids.  States pain is much worse than when she originally arrived to the ED, rated 10/10.  Pain described as constant, waxing and waning, sharp, and affecting her ability to breathe to where she now feels short of breath.  Known Hx of asthma, poor compliance with inhaler per patient's daughter whom lives with her.  No recent cough or URI symptoms.  Denies urinary symptoms, back pain, leg weakness or numbness, chest pain, diarrhea, bloody bowel movements, constipation, or neck stiffness.  No new recent sexual partners.  Denies vaginal bleeding, discharge, pain, or discomfort.  The history is provided by the patient and medical records.  Abdominal Pain     Home Medications Prior to Admission medications   Medication Sig Start Date End Date Taking? Authorizing Provider  acetaminophen (TYLENOL) 500 MG tablet Take 500-1,000 mg by mouth every 8 (eight) hours as needed for moderate pain.    [provider]  albuterol (VENTOLIN HFA) 108 (90 Base) MCG/ACT inhaler Inhale 2 puffs into the  lungs every 4 (four) hours as needed for wheezing or shortness of breath.    [provider]  amLODipine (NORVASC) 10 MG tablet Take 1 tablet (10 mg total) by mouth daily. 11/03/21   [name], NP  apixaban (ELIQUIS) 5 MG TABS tablet Take 1 tablet (5 mg total) by mouth 2 (two) times daily. 06/18/21   Belva Crome, MD  cetirizine (ZYRTEC) 10 MG tablet Take 10 mg by mouth daily as needed for allergies.    [provider]  Cyanocobalamin (VITAMIN B-12 PO) Take 1 tablet by mouth daily.    [provider]  digoxin (LANOXIN) 0.125 MG tablet Take 1 tablet (0.125 mg total) by mouth daily. 06/11/21   [name], PA-C  diltiazem (DILACOR XR) 180 MG 24 hr capsule Take 180 mg by mouth daily.    [provider]  meclizine (ANTIVERT) 25 MG tablet Take 25 mg by mouth 2 (two) times daily as needed (vertigo). 11/12/20   [provider]  metFORMIN (GLUCOPHAGE-XR) 750 MG 24 hr tablet Take 750 mg by mouth daily. 11/12/20   [provider]  Multiple Vitamins-Minerals (MULTIVITAMIN WITH MINERALS) tablet Take 1 tablet by mouth daily.    [provider]  rOPINIRole (REQUIP) 4 MG tablet Take 4 mg by mouth at bedtime.    [provider]  rosuvastatin (CRESTOR) 20 MG tablet Take 1 tablet (20 mg total) by mouth daily in the afternoon. Patient taking differently: Take 20 mg by mouth every other day. 09/15/20   Belva Crome, MD  Allergies    Bee venom, Wasp venom, Apricot flavor, and Atorvastatin    Review of Systems   Review of Systems  Gastrointestinal:  Positive for abdominal pain.    Physical Exam Updated Vital Signs BP (!) 186/90   Pulse (!) 59   Temp 98.1 F (36.7 C) (Oral)   Resp 17   SpO2 100%  Physical Exam Vitals and nursing note reviewed.  Constitutional:      General: She is not in acute distress.    Appearance: She is well-developed. She is ill-appearing. She is not toxic-appearing or diaphoretic.  HENT:      Head: Normocephalic and atraumatic.  Eyes:     Conjunctiva/sclera: Conjunctivae normal.  Cardiovascular:     Rate and Rhythm: Normal rate and regular rhythm.     Heart sounds: Murmur heard.  Pulmonary:     Effort: Pulmonary effort is normal. No respiratory distress.     Breath sounds: Wheezing (Lower lobes, mild, R > L) present.  Chest:     Chest wall: No tenderness.  Abdominal:     General: Abdomen is protuberant. There is no distension.     Palpations: Abdomen is soft.     Tenderness: There is abdominal tenderness in the right lower quadrant. Positive signs include Murphy's sign and McBurney's sign.  Musculoskeletal:        General: No swelling.     Cervical back: Neck supple.  Skin:    General: Skin is warm and dry.     Capillary Refill: Capillary refill takes less than 2 seconds.     Coloration: Skin is not cyanotic, jaundiced or pale.  Neurological:     Mental Status: She is alert and oriented to person, place, and time.  Psychiatric:        Mood and Affect: Mood normal.     ED Results / Procedures / Treatments   Labs (all labs ordered are listed, but only abnormal results are displayed) Labs Reviewed  COMPREHENSIVE METABOLIC PANEL - Abnormal; Notable for the following components:      Result Value   Potassium 3.4 (*)    Glucose, Bld 239 (*)    All other components within normal limits  URINALYSIS, ROUTINE W REFLEX MICROSCOPIC - Abnormal; Notable for the following components:   APPearance HAZY (*)    Glucose, UA >=500 (*)    Hgb urine dipstick LARGE (*)    Ketones, ur 20 (*)    Protein, ur 100 (*)    RBC / HPF >50 (*)    All other components within normal limits  URINE CULTURE  LIPASE, BLOOD  CBC    EKG EKG Interpretation  Date/Time:  Sunday November 07 2021 08:59:10 EDT Ventricular Rate:  62 PR Interval:  160 QRS Duration: 156 QT Interval:  479 QTC Calculation: 487 R Axis:   -78 Text Interpretation: paced rhytm similar to prior No STEMI Confirmed  by Antony Blackbird [phone]) on 11/07/2021 9:09:34 AM  Radiology CT Renal Stone Study  Result Date: 11/07/2021 CLINICAL DATA:  Right flank pain, right hydronephrosis seen in the sonogram EXAM: CT ABDOMEN AND PELVIS WITHOUT CONTRAST TECHNIQUE: Multidetector CT imaging of the abdomen and pelvis was performed following the standard protocol without IV contrast. RADIATION DOSE REDUCTION: This exam was performed according to the departmental dose-optimization program which includes automated exposure control, adjustment of the mA and/or kV according to patient size and/or use of iterative reconstruction technique. COMPARISON:  Previous CT done on 11/04/2021 and sonogram done earlier today FINDINGS:  Lower chest: Small patchy infiltrate is seen in left lower lobe. Coronary artery calcifications are seen. There is prosthetic aortic valve. Dense calcification is seen in mitral annulus. Cardiac pacer leads are noted in place. Hepatobiliary: In image 18 of series 3, there is subcentimeter low-density in the right lobe of liver, possibly cysts or hemangiomas. There is no dilation of bile ducts. Gallbladder is not distended. Pancreas: No focal abnormalities are seen. Spleen: Unremarkable. Adrenals/Urinary Tract: Adrenals are unremarkable. There is mild right hydronephrosis. There is mild to moderate dilation of proximal course of right ureter. There is mild right perinephric stranding. There are 2 small calcific densities in the upper pole of right kidney. Possible tiny calcifications are seen in the lower pole of left kidney. There is no demonstrable opaque calculus in the course of the right ureter. There are multiple round calcifications in both sides of pelvis, possibly vascular calcifications. There is no hydronephrosis on the left side. Stomach/Bowel: Stomach is unremarkable. Small bowel loops are not dilated. Appendix is unremarkable. There is no significant wall thickening in colon. Few diverticula are seen in colon  without signs of focal diverticulitis. Vascular/Lymphatic: Scattered arterial calcifications are seen. Reproductive: Uterus is unremarkable. There is 1.5 cm low-density in right side of pelvis, possibly ovarian follicle. Other: There is no ascites or pneumoperitoneum. Umbilical hernia containing fat is seen. Musculoskeletal: Degenerative changes are noted in lumbar spine with disc space narrowing, bony spurs and facet hypertrophy at multiple levels. There is encroachment of neural foramina from L3-S1 levels. IMPRESSION: There is mild to moderate right hydronephrosis. There is right perinephric stranding. There is no demonstrable opaque calculus in the course of the right ureter. Findings may suggest recent passage of ureteral calculus or non opaque calculus in the distal right ureter. There are few small calcifications in both kidneys each measuring less than 3 mm suggesting renal stones. There is right perinephric stranding which may be due to ureteric obstruction or superimposed infection. There is no evidence of intestinal obstruction or pneumoperitoneum. Appendix is not dilated. Scattered diverticula are seen in colon without signs of focal diverticulitis. Lumbar spondylosis. Small patchy infiltrate in left lower lung fields may suggest atelectasis/pneumonia. Other findings as described in the body of the report. Electronically Signed   By: Elmer Picker M.D.   On: 11/07/2021 12:53   US Pelvis Complete  Result Date: 11/07/2021 CLINICAL DATA:  76 year old female with pelvic pain. EXAM: TRANSABDOMINAL ULTRASOUND OF PELVIS TECHNIQUE: Transabdominal ultrasound examination of the pelvis was performed including evaluation of the uterus, ovaries, adnexal regions, and pelvic cul-de-sac. Patient declined transvaginal exam. COMPARISON:  11/04/2021 CT FINDINGS: Uterus Measurements: Anteverted measuring 7.8 x 1.9 x 3.5 cm = volume: 27 mL. No fibroids or other mass visualized. Endometrium Thickness: 2.3 mm.  No  focal abnormality visualized. Right ovary Not visualized.  No adnexal mass. Left ovary Not visualized.  No adnexal mass. Other findings:  No abnormal free fluid. IMPRESSION: 1. Unremarkable uterus. 2. Ovaries not visualized. No free fluid or adnexal mass. Electronically Signed   By: Margarette Canada M.D.   On: 11/07/2021 11:06   US Abdomen Limited RUQ (LIVER/GB)  Result Date: 11/07/2021 CLINICAL DATA:  Right upper quadrant pain. Per technologist report, limited exam due to body habitus. EXAM: ULTRASOUND ABDOMEN LIMITED RIGHT UPPER QUADRANT COMPARISON:  CT abdomen and pelvis November 04, 2021, abdominal ultrasound November 04, 2021 FINDINGS: Gallbladder: At least 1 shadowing gallstone is visualized. No gallbladder wall thickening or pericholecystic fluid. No sonographic Murphy sign noted by sonographer. Common  bile duct: Diameter: 4 mm Liver: No focal lesion identified. Increased liver parenchymal echogenicity. Portal vein is patent on color Doppler imaging with normal direction of blood flow towards the liver. Other: Mild right hydronephrosis. IMPRESSION: 1. Cholelithiasis without evidence of acute cholecystitis. 2. Incidentally visualized mild right hydronephrosis. Electronically Signed   By: Beryle Flock M.D.   On: 11/07/2021 11:03   DG Chest 2 View  Result Date: 11/07/2021 CLINICAL DATA:  Shortness of breath EXAM: CHEST - 2 VIEW COMPARISON:  Chest x-ray October 29, 2020, chest CT Jun 22, 2020 FINDINGS: Intact left chest wall single lead pacemaker. Unchanged cardiomediastinal contours. Mild diffuse interstitial pulmonary opacities. There is also a more focal left retrocardiac opacity. No large pleural effusion or pneumothorax. No acute osseous abnormality. The visualized upper abdomen is unremarkable. IMPRESSION: 1. Mild pulmonary edema. 2. There is a more focal left retrocardiac opacity, possibly reflecting edema or atelectasis. However, aspiration/infection is also possible in the appropriate clinical  context. Electronically Signed   By: Beryle Flock M.D.   On: 11/07/2021 09:29   MR ABDOMEN MRCP W WO CONTAST  Result Date: 11/05/2021 CLINICAL DATA:  Inpatient. Right upper quadrant abdominal pain. Nondiagnostic ultrasound. EXAM: MRI ABDOMEN WITHOUT AND WITH CONTRAST (INCLUDING MRCP) TECHNIQUE: Multiplanar multisequence MR imaging of the abdomen was performed both before and after the administration of intravenous contrast. Heavily T2-weighted images of the biliary and pancreatic ducts were obtained, and three-dimensional MRCP images were rendered by post processing. CONTRAST:  24mL GADAVIST GADOBUTROL 1 MMOL/ML IV SOLN COMPARISON:  11/04/2021 CT abdomen/pelvis and right upper quadrant abdominal sonogram. FINDINGS: Lower chest: No acute abnormality at the lung bases. Hepatobiliary: Normal liver size and configuration. No definite liver surface irregularity. No hepatic steatosis. Simple subcentimeter segment 7 right liver cyst. No suspicious liver masses. Two tiny layering gallstones in the gallbladder, largest 4 mm. No gallbladder wall thickening. No pericholecystic fluid. No intrahepatic biliary ductal dilatation. Common bile duct diameter 4 mm. Small periampullary duodenal diverticulum. No choledocholithiasis. No biliary masses, strictures or beading. Pancreas: No pancreatic mass or duct dilation.  No pancreas divisum. Spleen: Normal size. No mass. Adrenals/Urinary Tract: Normal adrenals. No hydronephrosis. T1 hyperintense 1.3 cm lower right renal cortical lesion with small hematocrit level and without compelling evidence of enhancement on the motion degraded postcontrast sequences (series 18/image 65), compatible with Bosniak category 2 hemorrhagic/proteinaceous renal cyst. Small 1.0 cm posterior upper left renal cyst with thin internal septation, compatible with Bosniak category 2 renal cyst. Additional scattered subcentimeter simple bilateral renal cysts. No suspicious renal masses. Stomach/Bowel: Normal  non-distended stomach. Visualized small and large bowel is normal caliber, with no bowel wall thickening. Vascular/Lymphatic: Atherosclerotic nonaneurysmal abdominal aorta. Patent portal, splenic, hepatic and renal veins. No pathologically enlarged lymph nodes in the abdomen. Other: No abdominal ascites or focal fluid collection. Musculoskeletal: No aggressive appearing focal osseous lesions. IMPRESSION: 1. Cholelithiasis. No gallbladder wall thickening or other MRI findings to suggest acute cholecystitis. No biliary ductal dilatation. No choledocholithiasis. 2. Small Bosniak category 1 and category 2 renal cysts. No suspicious renal masses. Electronically Signed   By: Ilona Sorrel M.D.   On: 11/05/2021 15:13   MR 3D Recon At Scanner  Result Date: 11/05/2021 CLINICAL DATA:  Inpatient. Right upper quadrant abdominal pain. Nondiagnostic ultrasound. EXAM: MRI ABDOMEN WITHOUT AND WITH CONTRAST (INCLUDING MRCP) TECHNIQUE: Multiplanar multisequence MR imaging of the abdomen was performed both before and after the administration of intravenous contrast. Heavily T2-weighted images of the biliary and pancreatic ducts were obtained, and three-dimensional  MRCP images were rendered by post processing. CONTRAST:  80mL GADAVIST GADOBUTROL 1 MMOL/ML IV SOLN COMPARISON:  11/04/2021 CT abdomen/pelvis and right upper quadrant abdominal sonogram. FINDINGS: Lower chest: No acute abnormality at the lung bases. Hepatobiliary: Normal liver size and configuration. No definite liver surface irregularity. No hepatic steatosis. Simple subcentimeter segment 7 right liver cyst. No suspicious liver masses. Two tiny layering gallstones in the gallbladder, largest 4 mm. No gallbladder wall thickening. No pericholecystic fluid. No intrahepatic biliary ductal dilatation. Common bile duct diameter 4 mm. Small periampullary duodenal diverticulum. No choledocholithiasis. No biliary masses, strictures or beading. Pancreas: No pancreatic mass or duct  dilation.  No pancreas divisum. Spleen: Normal size. No mass. Adrenals/Urinary Tract: Normal adrenals. No hydronephrosis. T1 hyperintense 1.3 cm lower right renal cortical lesion with small hematocrit level and without compelling evidence of enhancement on the motion degraded postcontrast sequences (series 18/image 65), compatible with Bosniak category 2 hemorrhagic/proteinaceous renal cyst. Small 1.0 cm posterior upper left renal cyst with thin internal septation, compatible with Bosniak category 2 renal cyst. Additional scattered subcentimeter simple bilateral renal cysts. No suspicious renal masses. Stomach/Bowel: Normal non-distended stomach. Visualized small and large bowel is normal caliber, with no bowel wall thickening. Vascular/Lymphatic: Atherosclerotic nonaneurysmal abdominal aorta. Patent portal, splenic, hepatic and renal veins. No pathologically enlarged lymph nodes in the abdomen. Other: No abdominal ascites or focal fluid collection. Musculoskeletal: No aggressive appearing focal osseous lesions. IMPRESSION: 1. Cholelithiasis. No gallbladder wall thickening or other MRI findings to suggest acute cholecystitis. No biliary ductal dilatation. No choledocholithiasis. 2. Small Bosniak category 1 and category 2 renal cysts. No suspicious renal masses. Electronically Signed   By: Ilona Sorrel M.D.   On: 11/05/2021 15:13    Procedures Procedures    Medications Ordered in ED Medications  tamsulosin (FLOMAX) capsule 0.4 mg (has no administration in time range)  0.9 %  sodium chloride infusion (has no administration in time range)  cefTRIAXone (ROCEPHIN) 2 g in sodium chloride 0.9 % 100 mL IVPB (has no administration in time range)  ondansetron (ZOFRAN) injection 4 mg (4 mg Intravenous Given 11/07/21 0049)  fentaNYL (SUBLIMAZE) injection 50 mcg (50 mcg Intravenous Given 11/07/21 0858)  fentaNYL (SUBLIMAZE) injection 50 mcg (50 mcg Intravenous Given 11/07/21 0947)  ondansetron (ZOFRAN) injection 4 mg  (4 mg Intravenous Given 11/07/21 0947)  sodium chloride 0.9 % bolus 500 mL (0 mLs Intravenous Stopped 11/07/21 1116)    ED Course/ Medical Decision Making/ A&P Clinical Course as of 11/07/21 1340  Sun Nov 07, 2021  1326 Consulted with Dr. Kathrynn Ducking of Urology, reviewed imaging and discussed patient's case in detail.  Discussed possible stent versus close monitoring with admission to medicine.  She does not believe a stent would be most beneficial at this time, no evidence of acute calculus obstruction.  Imaging suggests likely recently passed calculus.  Moderate hydronephrosis and uncontrollable pain concerning, recommends use of Flomax.  Not able to utilize Toradol, currently on Eliquis.  Plans to follow with admission to medicine and to come evaluate the patient. [AC]  1331 Consulted with Dr. Roosevelt Locks of hospitalist team.  Discussed patient's case in detail and discussion/recommendations with Urology.  Agrees with plan for admission for pain management and further treatment of possible ureteral obstruction [AC]    Clinical Course User Index [AC] Prince Rome, PA-C                           Medical Decision Making Amount and/or  Complexity of Data Reviewed Labs: ordered. Radiology: ordered.  Risk Prescription drug management. Decision regarding hospitalization.   76 y.o. female presents to the ED for concern of Abdominal Pain     This involves an extensive number of treatment options, and is a complaint that carries with it a high risk of complications and morbidity.  The emergent differential diagnosis prior to evaluation includes, but is not limited to: Acute cholecystitis, acute appendicitis, ovarian torsion, UTI, pyelonephritis, renal calculi  This is not an exhaustive differential.   Past Medical History / Co-morbidities / Social History: Hx of hypercholesterolemia, GERD, DMT2, A-fib status post TAVR/pacemaker on Eliquis.  Surgical history of tubal ligation, ovaries still present  per patient. Social Determinants of Health include: Elderly  Additional History:  Obtained by chart review.  Notably recent ED to hospital admission with notes from gastroenterology and general surgery and discharge disposition reviewed.  See for details.  Lab Tests: I ordered, and personally interpreted labs.  The pertinent results include:   Lipase 30 LFTs within normal limits WBC 9.9, without evidence of anemia Total bili 0.8 UA negative for nitrites, leukocytes, or bacteria, though with RBCs > 50.  Ketones likely due to decreased food intake and dehydration Urine culture pending  Imaging Studies: I ordered imaging studies including CXR, [place] RUQ, US pelvic series.   I independently visualized and interpreted imaging which showed  CXR: Evidence of mild pulmonary edema with possible small area of atelectasis (left retrocardiac in location) US pelvic series: [place] RUQ: Cholelithiasis without evidence of acute cholecystitis though with incidental finding of right hydronephrosis, plan to evaluate further CT renal study: Mild to moderate right hydronephrosis. There is right perinephric stranding. No demonstrable opaque calculus in the course of the right ureter. Findings may suggest recent passage of ureteral calculus or non opaque calculus in the distal right ureter. Few small calcifications in both kidneys each measuring less than 3 mm suggesting renal stones. With right perinephric stranding, may be due to ureteric obstruction or superimposed infection. No evidence of intestinal obstruction or pneumoperitoneum. Appendix not dilated. Scattered diverticula seen in colon without signs of focal diverticulitis. I agree with the radiologist interpretation.  Cardiac Monitoring: The patient was maintained on a cardiac monitor.  I personally viewed and interpreted the cardiac monitored which showed an underlying rhythm of: ventricularly paced rhythm around 60-70 bpm  ED Course / Critical  Interventions: Pt ill-appearing on exam.  Nontoxic nonseptic appearing.  Returning to the ED today for continued right-sided abdominal pain.  Brought in by EMS, received 200 mcg of fentanyl in route.  Received 4 mg Zofran in the waiting room.  Seen on 11/04/2021, admitted to the hospital for further evaluation of possible gallbladder pathology.  [place] indicated gallbladder wall thickening with positive Murphy sign, however no gallstones or sludge appreciated.  CBD was also dilated, however LFTs and lipase normal.  Recent CT imaging without evidence of renal calculi or pyelonephritis, though evidence of multiple renal cysts appreciated on MRI abdomen.  MRCP was recommended and general surgery/GI were consulted. On exam with exquisite RLQ tenderness and McBurney's point tenderness.  Mild to moderate Murphy sign noted.  Abdomen protuberant, soft.  Mild bilateral wheezing lower lobes, worse on right than left.  Breathing appears shallow, between 17 to 21 breaths/min, though does not appear to be in acute respiratory distress.  Is not cyanotic or pale.  Satting at 96% on room air.  Again without chest pain, chest non-TTP.  EKG without significant changes since last tracing.  Known pacemaker, with evident ventricular pacing on EKG.  Hx of TAVR, murmur appreciated on exam.  Recent CT abdomen and pelvis not indicative of appendicitis.  Recent MRCP not indicative of acute cholecystitis or acute cholangitis.  Lipase normal, LFTs within normal limits.  Lower likelihood of gallbladder etiology due to recent imaging, however plan to reassess with [place] of RUQ for completeness.  Without vaginal or urological complaints, though UA from 11/04/2021 with RBCs and mild WBCs.  Plan to reassess UA and send urine for culture.  Also plan to assess for possible atypical ovarian torsion with US pelvic series as well.  If imaging without acute findings, may consider admission to hospitalist team for pain management and further evaluation.   Fentanyl and Zofran provided. CXR indicates very mild pulmonary edema.  Known significant cardiac history.  Afebrile, without elevated WBC or evidence of infection or sepsis.  Again does not meet SIRS or sepsis criteria at this time.  Low suspicion for pneumonia, no evidence of pneumothorax.  [place] RUQ again indicates cholelithiasis without evidence of cholecystitis, though with incidental finding of hydronephrosis.  US pelvic series without abnormal findings, though ovaries were not visualized.  Upon reevaluation, patient still with significant pain on exam.  Provide another dose of fentanyl, though informed by RN that saturation dropped to the high 80s, 2 to 4 L O2 placed.  Due to patient's extensive cardiac history and vital signs, options for pain control are limited at this time.  Reports nausea improvement.  Due to incidental exam finding of hydronephrosis, plan to assess for an occult renal calculi that may have been missed on CT abdomen and pelvis with contrast or a UTI that may have progressed over the last few days.  If this is negative, anticipate admission for further work-up and pain management. CT renal study indicates mild to moderate right hydronephrosis with perinephric stranding.  However no obvious opaque calculus, imaging suggestive of either recent passage of ureteral calculus or a nonopaque calculus, and possibly ureter obstruction or superimposed infection.  Also with some mild scattered calculi throughout the kidneys, each no larger than 3 mm.  Plan to consult with urology. Consulted with Dr Kathrynn Ducking of urology, did not recommend stent placement at this time though recommended utilization of Flomax.  Plans to come assess the patient and follow patient upon admission to medicine.  Consulted with Dr. Roosevelt Locks of hospitalist team, agrees with plan for admission.  Disposition: Admission    I discussed this case with my attending, Dr. Sherry Ruffing, who agreed with the proposed treatment course and  cosigned this note including patient's presenting symptoms, physical exam, and planned diagnostics and interventions.  Attending physician stated agreement with plan or made changes to plan which were implemented.     This chart was dictated using voice recognition software.  Despite best efforts to proofread, errors can occur which can change the documentation meaning.         Final Clinical Impression(s) / ED Diagnoses Final diagnoses:  Right lower quadrant abdominal pain  Type 2 diabetes mellitus without complication, without long-term current use of insulin (HCC)  S/P TAVR (transcatheter aortic valve replacement)  Essential hypertension  Hydronephrosis, unspecified hydronephrosis type    Rx / DC Orders ED Discharge Orders     None         Prince Rome, PA-C [id number]    Tegeler, Gwenyth Allegra, MD 11/07/21 1402

## 2021-11-07 NOTE — H&P (Addendum)
History and Physical    Briana Foster [id number] DOB: [birth year] DOA: 11/07/2021  PCP: Wenda Low, MD (Confirm with patient/family/NH records and if not entered, this has to be entered at [facility] point of entry) Patient coming from: Home  I have personally briefly reviewed patient's old medical records in [place]  Chief Complaint: Abdominal pain and flank pain  HPI: AGDA CRESSWELL is a 76 y.o. female with medical history significant of HTN, PAF on Eliquis, aortic stenosis status post TAVR, mild intermittent asthma, AV node ablation and PPM, presented to ED with recurrent right-sided abdominal pain.  Patient was recently hospitalized for similar presentation, with right lower quadrant abdominal pain and nausea, after her normal GI study including MRCP, she was discharged home.  Initially she was stable, but last night, patient started to have cramping like right lower quadrant abdominal pain again 10/10 radiating to the right groin area no fever chills no dysuria or burning sensation.  ED Course: Afebrile, blood pressure elevated no tachycardia.  CT abdomen pelvis with contrast showed mild to moderate right hydronephrosis with right perinephric stranding no identifiable optic calculus in the course of the right ureter, findings suspect recent passage of ureteral calculus or nonatopic calculus.  WBC 8.9, creatinine within normal limits.  Urology consulted and recommended conservative management with pain control.  UA showed microscopic hematuria but WBC 0-5.  Review of Systems: As per HPI otherwise 14 point review of systems negative.    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 Angelena Form & Dr. Roxy Manns   Severe aortic stenosis    Type 2 diabetes mellitus without complication, without long-term current use of insulin ([place]) 04/10/2018   Vitamin D deficiency     Past Surgical History:  Procedure Laterality Date   AV NODE ABLATION N/A 07/14/2021   Procedure: AV NODE ABLATION;  Surgeon: Constance Haw, MD;  Location: [place] [facility];  Service: Cardiovascular;  Laterality: N/A;   BUNIONECTOMY  20 yrs ago   bil feet   BUNIONECTOMY  11/30/2011   Procedure: BUNIONECTOMY;  Surgeon: Magnus Sinning, MD;  Location: [facility];  Service: Orthopedics;  Laterality: Bilateral;  RIGHT FOOT EXCISION OF BUNIONETTE AND PARTIAL   PROXIMAL PHALANGECTOMY OF 5TH TOE LEFT FOOT FUNK BUNIONECTOMY,EXCISION OF BUNIONETTE    CARDIOVERSION N/A 11/05/2019   Procedure: CARDIOVERSION;  Surgeon: Lelon Perla, MD;  Location: [facility];  Service: Cardiovascular;  Laterality: N/A;   CARDIOVERSION N/A 12/05/2019   Procedure: CARDIOVERSION;  Surgeon: Elouise Munroe, MD;  Location: [place];  Service: Cardiovascular;  Laterality: N/A;   COLONOSCOPY WITH PROPOFOL N/A 08/07/2012   Procedure: COLONOSCOPY WITH PROPOFOL;  Surgeon: Garlan Fair, MD;  Location: [facility];  Service: Endoscopy;  Laterality: N/A;   MOUTH SURGERY  teeth extractions   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   Procedure: PACEMAKER IMPLANT;  Surgeon: Constance Haw, MD;  Location: [place] [facility];  Service: Cardiovascular;  Laterality: N/A;   RIGHT/LEFT HEART CATH AND CORONARY ANGIOGRAPHY N/A 06/17/2020   Procedure: RIGHT/LEFT HEART CATH AND CORONARY ANGIOGRAPHY;  Surgeon: Belva Crome, MD;  Location: [place] [facility];   Service: Cardiovascular;  Laterality: N/A;   TONSILLECTOMY     TRANSCATHETER AORTIC VALVE REPLACEMENT, TRANSFEMORAL N/A 06/30/2020   Procedure: TRANSCATHETER AORTIC VALVE REPLACEMENT, TRANSFEMORAL;  Surgeon: Burnell Blanks, MD;  Location: [facility] [place] [facility];  Service: Open Heart Surgery;  Laterality: N/A;   TUBAL LIGATION     WRIST SURGERY       reports that she has never smoked. She has never used smokeless tobacco. She reports that she does not drink alcohol and does not use drugs.  Allergies  Allergen Reactions   Bee Venom Shortness Of Breath and Rash   Wasp Venom Other (See Comments) and Anaphylaxis   Apricot Flavor Swelling    Eyes swell shut   Atorvastatin Itching    Eye swelling Other reaction(s): HA    Family History  Problem Relation Age of Onset   Heart disease Mother    Stroke Mother    Cancer Father        Prostate    Prior to Admission medications   Medication Sig Start Date End Date Taking? Authorizing Provider  acetaminophen (TYLENOL) 500 MG tablet Take 500-1,000 mg by mouth every 8 (eight) hours as needed for moderate pain.    [provider]  albuterol (VENTOLIN HFA) 108 (90 Base) MCG/ACT inhaler Inhale 2 puffs into the lungs every 4 (four) hours as needed for wheezing or shortness of breath.    [provider]  amLODipine (NORVASC) 10 MG tablet Take 1 tablet (10 mg total) by mouth daily. 11/03/21   [name], NP  apixaban (ELIQUIS) 5 MG TABS tablet Take 1 tablet (5 mg total) by mouth 2 (two) times daily. 06/18/21   Belva Crome, MD  cetirizine (ZYRTEC) 10 MG tablet Take 10 mg by mouth daily as needed for allergies.    [provider]  Cyanocobalamin (VITAMIN B-12 PO) Take 1 tablet by mouth daily.    [provider]  digoxin (LANOXIN) 0.125 MG tablet Take 1 tablet (0.125 mg total) by mouth daily. 06/11/21   [name], PA-C  diltiazem (DILACOR XR) 180 MG 24 hr capsule Take 180 mg by mouth daily.     [provider]  meclizine (ANTIVERT) 25 MG tablet Take 25 mg by mouth 2 (two) times daily as needed (vertigo). 11/12/20   [provider]  metFORMIN (GLUCOPHAGE-XR) 750 MG 24 hr tablet Take 750 mg by mouth daily. 11/12/20   [provider]  Multiple Vitamins-Minerals (MULTIVITAMIN WITH MINERALS) tablet Take 1 tablet by mouth daily.    [provider]  rOPINIRole (REQUIP) 4 MG tablet Take 4 mg by mouth at bedtime.    [provider]  rosuvastatin (CRESTOR) 20 MG tablet Take 1 tablet (20 mg total) by mouth daily in the afternoon. Patient taking differently: Take 20 mg by mouth every other day. 09/15/20   Belva Crome, MD    Physical Exam: Vitals:   11/07/21 0845 11/07/21 1100 11/07/21 1115 11/07/21 1140  BP: (!) 188/71 (!) 165/89 (!) 186/90   Pulse: 64 70 (!) 59   Resp: 17 16 17    Temp:  98.1 F (36.7 C)  TempSrc:    Oral  SpO2: 96% 100% 100%     Constitutional: NAD, calm, comfortable Vitals:   11/07/21 0845 11/07/21 1100 11/07/21 1115 11/07/21 1140  BP: (!) 188/71 (!) 165/89 (!) 186/90   Pulse: 64 70 (!) 59   Resp: 17 16 17    Temp:    98.1 F (36.7 C)  TempSrc:    Oral  SpO2: 96% 100% 100%    Eyes: PERRL, lids and conjunctivae normal ENMT: Mucous membranes are moist. Posterior pharynx clear of any exudate or lesions.Normal dentition.  Neck: normal, supple, no masses, no thyromegaly Respiratory: clear to auscultation bilaterally, no wheezing, no crackles. Normal respiratory effort. No accessory muscle use.  Cardiovascular: Regular rate and rhythm, no murmurs / rubs / gallops. No extremity edema. 2+ pedal pulses. No carotid bruits.  Abdomen: mild tenderness on right flank and CVA, no rebound no guarding, no masses palpated. No hepatosplenomegaly. Bowel sounds positive.  Musculoskeletal: no clubbing / cyanosis. No joint deformity upper and lower extremities. Good ROM, no contractures. Normal muscle tone.  Skin: no rashes, lesions,  ulcers. No induration Neurologic: CN 2-12 grossly intact. Sensation intact, DTR normal. Strength 5/5 in all 4.  Psychiatric: Normal judgment and insight. Alert and oriented x 3. Normal mood.    Labs on Admission: I have personally reviewed following labs and imaging studies  CBC: Recent Labs  Lab 11/04/21 1115 11/05/21 0452 11/06/21 0348 11/07/21 0051  WBC 11.0* 8.3 7.2 9.9  NEUTROABS 9.3*  --   --   --   HGB 14.2 12.5 12.3 13.5  HCT 41.7 37.5 36.8 39.6  MCV 93.1 93.5 94.6 93.8  PLT 172 156 155 0000000   Basic Metabolic Panel: Recent Labs  Lab 11/04/21 1115 11/06/21 0348 11/07/21 0051  NA 138 140 142  K 3.5 3.8 3.4*  CL 106 105 107  CO2 21* 24 24  GLUCOSE 305* 170* 239*  BUN 14 14 13   CREATININE 0.88 1.08* 0.90  CALCIUM 9.4 9.4 9.8   GFR: Estimated Creatinine Clearance: 62.4 mL/min (by C-G formula based on SCr of 0.9 mg/dL). Liver Function Tests: Recent Labs  Lab 11/04/21 1115 11/05/21 0452 11/06/21 0348 11/07/21 0051  AST 20 18 19 27   ALT 22 20 23 27   ALKPHOS 65 60 57 64  BILITOT 0.6 1.1 0.7 0.8  PROT 6.9 6.2* 6.4* 7.4  ALBUMIN 3.8 3.4* 3.4* 4.1   Recent Labs  Lab 11/04/21 1115 11/07/21 0051  LIPASE 28 30   No results for input(s): "AMMONIA" in the last 168 hours. Coagulation Profile: No results for input(s): "INR", "PROTIME" in the last 168 hours. Cardiac Enzymes: No results for input(s): "CKTOTAL", "CKMB", "CKMBINDEX", "TROPONINI" in the last 168 hours. BNP (last 3 results) No results for input(s): "PROBNP" in the last 8760 hours. HbA1C: Recent Labs    11/04/21 2023  HGBA1C 8.9*   CBG: Recent Labs  Lab 11/05/21 0802 11/05/21 1237 11/05/21 1540 11/06/21 0833 11/06/21 1234  GLUCAP 140* 136* 101* 160* 216*   Lipid Profile: No results for input(s): "CHOL", "HDL", "LDLCALC", "TRIG", "CHOLHDL", "LDLDIRECT" in the last 72 hours. Thyroid Function Tests: No results for input(s): "TSH", "T4TOTAL", "FREET4", "T3FREE", "THYROIDAB" in the last 72  hours. Anemia Panel: No results for input(s): "VITAMINB12", "FOLATE", "FERRITIN", "TIBC", "IRON", "RETICCTPCT" in the last 72 hours. Urine analysis:    Component Value Date/Time   COLORURINE YELLOW 11/07/2021 0044   APPEARANCEUR HAZY (A) 11/07/2021 0044   LABSPEC 1.021 11/07/2021 0044  PHURINE 5.0 11/07/2021 0044   GLUCOSEU >=500 (A) 11/07/2021 0044   HGBUR LARGE (A) 11/07/2021 0044   BILIRUBINUR NEGATIVE 11/07/2021 0044   KETONESUR 20 (A) 11/07/2021 0044   PROTEINUR 100 (A) 11/07/2021 0044   NITRITE NEGATIVE 11/07/2021 0044   LEUKOCYTESUR NEGATIVE 11/07/2021 0044    Radiological Exams on Admission: CT Renal Stone Study  Result Date: 11/07/2021 CLINICAL DATA:  Right flank pain, right hydronephrosis seen in the sonogram EXAM: CT ABDOMEN AND PELVIS WITHOUT CONTRAST TECHNIQUE: Multidetector CT imaging of the abdomen and pelvis was performed following the standard protocol without IV contrast. RADIATION DOSE REDUCTION: This exam was performed according to the departmental dose-optimization program which includes automated exposure control, adjustment of the mA and/or kV according to patient size and/or use of iterative reconstruction technique. COMPARISON:  Previous CT done on 11/04/2021 and sonogram done earlier today FINDINGS: Lower chest: Small patchy infiltrate is seen in left lower lobe. Coronary artery calcifications are seen. There is prosthetic aortic valve. Dense calcification is seen in mitral annulus. Cardiac pacer leads are noted in place. Hepatobiliary: In image 18 of series 3, there is subcentimeter low-density in the right lobe of liver, possibly cysts or hemangiomas. There is no dilation of bile ducts. Gallbladder is not distended. Pancreas: No focal abnormalities are seen. Spleen: Unremarkable. Adrenals/Urinary Tract: Adrenals are unremarkable. There is mild right hydronephrosis. There is mild to moderate dilation of proximal course of right ureter. There is mild right perinephric  stranding. There are 2 small calcific densities in the upper pole of right kidney. Possible tiny calcifications are seen in the lower pole of left kidney. There is no demonstrable opaque calculus in the course of the right ureter. There are multiple round calcifications in both sides of pelvis, possibly vascular calcifications. There is no hydronephrosis on the left side. Stomach/Bowel: Stomach is unremarkable. Small bowel loops are not dilated. Appendix is unremarkable. There is no significant wall thickening in colon. Few diverticula are seen in colon without signs of focal diverticulitis. Vascular/Lymphatic: Scattered arterial calcifications are seen. Reproductive: Uterus is unremarkable. There is 1.5 cm low-density in right side of pelvis, possibly ovarian follicle. Other: There is no ascites or pneumoperitoneum. Umbilical hernia containing fat is seen. Musculoskeletal: Degenerative changes are noted in lumbar spine with disc space narrowing, bony spurs and facet hypertrophy at multiple levels. There is encroachment of neural foramina from L3-S1 levels. IMPRESSION: There is mild to moderate right hydronephrosis. There is right perinephric stranding. There is no demonstrable opaque calculus in the course of the right ureter. Findings may suggest recent passage of ureteral calculus or non opaque calculus in the distal right ureter. There are few small calcifications in both kidneys each measuring less than 3 mm suggesting renal stones. There is right perinephric stranding which may be due to ureteric obstruction or superimposed infection. There is no evidence of intestinal obstruction or pneumoperitoneum. Appendix is not dilated. Scattered diverticula are seen in colon without signs of focal diverticulitis. Lumbar spondylosis. Small patchy infiltrate in left lower lung fields may suggest atelectasis/pneumonia. Other findings as described in the body of the report. Electronically Signed   By: Elmer Picker  M.D.   On: 11/07/2021 12:53   US Pelvis Complete  Result Date: 11/07/2021 CLINICAL DATA:  76 year old female with pelvic pain. EXAM: TRANSABDOMINAL ULTRASOUND OF PELVIS TECHNIQUE: Transabdominal ultrasound examination of the pelvis was performed including evaluation of the uterus, ovaries, adnexal regions, and pelvic cul-de-sac. Patient declined transvaginal exam. COMPARISON:  11/04/2021 CT FINDINGS: Uterus Measurements: Anteverted  measuring 7.8 x 1.9 x 3.5 cm = volume: 27 mL. No fibroids or other mass visualized. Endometrium Thickness: 2.3 mm.  No focal abnormality visualized. Right ovary Not visualized.  No adnexal mass. Left ovary Not visualized.  No adnexal mass. Other findings:  No abnormal free fluid. IMPRESSION: 1. Unremarkable uterus. 2. Ovaries not visualized. No free fluid or adnexal mass. Electronically Signed   By: Margarette Canada M.D.   On: 11/07/2021 11:06   US Abdomen Limited RUQ (LIVER/GB)  Result Date: 11/07/2021 CLINICAL DATA:  Right upper quadrant pain. Per technologist report, limited exam due to body habitus. EXAM: ULTRASOUND ABDOMEN LIMITED RIGHT UPPER QUADRANT COMPARISON:  CT abdomen and pelvis November 04, 2021, abdominal ultrasound November 04, 2021 FINDINGS: Gallbladder: At least 1 shadowing gallstone is visualized. No gallbladder wall thickening or pericholecystic fluid. No sonographic Murphy sign noted by sonographer. Common bile duct: Diameter: 4 mm Liver: No focal lesion identified. Increased liver parenchymal echogenicity. Portal vein is patent on color Doppler imaging with normal direction of blood flow towards the liver. Other: Mild right hydronephrosis. IMPRESSION: 1. Cholelithiasis without evidence of acute cholecystitis. 2. Incidentally visualized mild right hydronephrosis. Electronically Signed   By: Beryle Flock M.D.   On: 11/07/2021 11:03   DG Chest 2 View  Result Date: 11/07/2021 CLINICAL DATA:  Shortness of breath EXAM: CHEST - 2 VIEW COMPARISON:  Chest x-ray  October 29, 2020, chest CT Jun 22, 2020 FINDINGS: Intact left chest wall single lead pacemaker. Unchanged cardiomediastinal contours. Mild diffuse interstitial pulmonary opacities. There is also a more focal left retrocardiac opacity. No large pleural effusion or pneumothorax. No acute osseous abnormality. The visualized upper abdomen is unremarkable. IMPRESSION: 1. Mild pulmonary edema. 2. There is a more focal left retrocardiac opacity, possibly reflecting edema or atelectasis. However, aspiration/infection is also possible in the appropriate clinical context. Electronically Signed   By: Beryle Flock M.D.   On: 11/07/2021 09:29   MR ABDOMEN MRCP W WO CONTAST  Result Date: 11/05/2021 CLINICAL DATA:  Inpatient. Right upper quadrant abdominal pain. Nondiagnostic ultrasound. EXAM: MRI ABDOMEN WITHOUT AND WITH CONTRAST (INCLUDING MRCP) TECHNIQUE: Multiplanar multisequence MR imaging of the abdomen was performed both before and after the administration of intravenous contrast. Heavily T2-weighted images of the biliary and pancreatic ducts were obtained, and three-dimensional MRCP images were rendered by post processing. CONTRAST:  5mL GADAVIST GADOBUTROL 1 MMOL/ML IV SOLN COMPARISON:  11/04/2021 CT abdomen/pelvis and right upper quadrant abdominal sonogram. FINDINGS: Lower chest: No acute abnormality at the lung bases. Hepatobiliary: Normal liver size and configuration. No definite liver surface irregularity. No hepatic steatosis. Simple subcentimeter segment 7 right liver cyst. No suspicious liver masses. Two tiny layering gallstones in the gallbladder, largest 4 mm. No gallbladder wall thickening. No pericholecystic fluid. No intrahepatic biliary ductal dilatation. Common bile duct diameter 4 mm. Small periampullary duodenal diverticulum. No choledocholithiasis. No biliary masses, strictures or beading. Pancreas: No pancreatic mass or duct dilation.  No pancreas divisum. Spleen: Normal size. No mass.  Adrenals/Urinary Tract: Normal adrenals. No hydronephrosis. T1 hyperintense 1.3 cm lower right renal cortical lesion with small hematocrit level and without compelling evidence of enhancement on the motion degraded postcontrast sequences (series 18/image 65), compatible with Bosniak category 2 hemorrhagic/proteinaceous renal cyst. Small 1.0 cm posterior upper left renal cyst with thin internal septation, compatible with Bosniak category 2 renal cyst. Additional scattered subcentimeter simple bilateral renal cysts. No suspicious renal masses. Stomach/Bowel: Normal non-distended stomach. Visualized small and large bowel is normal caliber, with no bowel  wall thickening. Vascular/Lymphatic: Atherosclerotic nonaneurysmal abdominal aorta. Patent portal, splenic, hepatic and renal veins. No pathologically enlarged lymph nodes in the abdomen. Other: No abdominal ascites or focal fluid collection. Musculoskeletal: No aggressive appearing focal osseous lesions. IMPRESSION: 1. Cholelithiasis. No gallbladder wall thickening or other MRI findings to suggest acute cholecystitis. No biliary ductal dilatation. No choledocholithiasis. 2. Small Bosniak category 1 and category 2 renal cysts. No suspicious renal masses. Electronically Signed   By: Ilona Sorrel M.D.   On: 11/05/2021 15:13   MR 3D Recon At Scanner  Result Date: 11/05/2021 CLINICAL DATA:  Inpatient. Right upper quadrant abdominal pain. Nondiagnostic ultrasound. EXAM: MRI ABDOMEN WITHOUT AND WITH CONTRAST (INCLUDING MRCP) TECHNIQUE: Multiplanar multisequence MR imaging of the abdomen was performed both before and after the administration of intravenous contrast. Heavily T2-weighted images of the biliary and pancreatic ducts were obtained, and three-dimensional MRCP images were rendered by post processing. CONTRAST:  57mL GADAVIST GADOBUTROL 1 MMOL/ML IV SOLN COMPARISON:  11/04/2021 CT abdomen/pelvis and right upper quadrant abdominal sonogram. FINDINGS: Lower chest: No  acute abnormality at the lung bases. Hepatobiliary: Normal liver size and configuration. No definite liver surface irregularity. No hepatic steatosis. Simple subcentimeter segment 7 right liver cyst. No suspicious liver masses. Two tiny layering gallstones in the gallbladder, largest 4 mm. No gallbladder wall thickening. No pericholecystic fluid. No intrahepatic biliary ductal dilatation. Common bile duct diameter 4 mm. Small periampullary duodenal diverticulum. No choledocholithiasis. No biliary masses, strictures or beading. Pancreas: No pancreatic mass or duct dilation.  No pancreas divisum. Spleen: Normal size. No mass. Adrenals/Urinary Tract: Normal adrenals. No hydronephrosis. T1 hyperintense 1.3 cm lower right renal cortical lesion with small hematocrit level and without compelling evidence of enhancement on the motion degraded postcontrast sequences (series 18/image 65), compatible with Bosniak category 2 hemorrhagic/proteinaceous renal cyst. Small 1.0 cm posterior upper left renal cyst with thin internal septation, compatible with Bosniak category 2 renal cyst. Additional scattered subcentimeter simple bilateral renal cysts. No suspicious renal masses. Stomach/Bowel: Normal non-distended stomach. Visualized small and large bowel is normal caliber, with no bowel wall thickening. Vascular/Lymphatic: Atherosclerotic nonaneurysmal abdominal aorta. Patent portal, splenic, hepatic and renal veins. No pathologically enlarged lymph nodes in the abdomen. Other: No abdominal ascites or focal fluid collection. Musculoskeletal: No aggressive appearing focal osseous lesions. IMPRESSION: 1. Cholelithiasis. No gallbladder wall thickening or other MRI findings to suggest acute cholecystitis. No biliary ductal dilatation. No choledocholithiasis. 2. Small Bosniak category 1 and category 2 renal cysts. No suspicious renal masses. Electronically Signed   By: Ilona Sorrel M.D.   On: 11/05/2021 15:13    EKG: Independently  reviewed.  Ventricular paced  Assessment/Plan Principal Problem:   UTI (urinary tract infection) Active Problems:   Acute pyelonephritis  (please populate well all problems here in Problem List. (For example, if patient is on BP meds at home and you resume or decide to hold them, it is a problem that needs to be her. Same for CAD, COPD, HLD and so on)  Acute pyelonephritis -Worsening of right flank pain and imaging study showed right kidney perinephritic stranding as well as microscopic hematuria indicating passing right kidney/ureteral stone and early pyelonephritis -Start ceftriaxone -Urine culture pending -Pain control, urology will see the patient this afternoon. -Other DDx, patient had extensive GI work-up on last admission, unlikely etiology related to hepato-billary system.  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 -Agreed with Flomax, urology follow-ups.  Hypokalemia -PO replacement  IIDM, with hyperglycemia -Most recent A1c 8.9 indicating poorly controlled baseline diabetes -Hold off metformin for 3 days as patient underwent CT with contrast -Sliding scale for now  Chronic a flutter with status post AVablation and ventricle pacemaker -Paced rhythm -Continue Eliquis -Continue Digoxin   HTN -Blood pressure elevated, resume home BP meds including amlodipine and Cardizem  Restless leg syndrome -No acute issue  DVT prophylaxis: Eliquis Code Status: Full code Family Communication: Daughter at bedside Disposition Plan: Expect less than 2 midnight hospital stay Consults called: Urology Admission status: MedSurg observation   Lequita Halt MD [facility] Pager [phone]  11/07/2021, 1:46 PM

## 2021-11-07 NOTE — ED Notes (Signed)
Requested pharmacy to release meds d/t bp of 009 systolic

## 2021-11-07 NOTE — ED Notes (Signed)
Urology at bedside.

## 2021-11-07 NOTE — ED Triage Notes (Signed)
Pt brought to ED by [facility] with c/o RLQ abdominal pain and vomiting. Was discharged from facility today for gallbladder issues.   EMS Interventions Zofran 4mg   Fentanyl 238mcg- TOTAL  EMS Vitals BP 158/84 HR 62 RR 14 SPO2 955 RA  CBG 190

## 2021-11-07 NOTE — ED Notes (Signed)
Patient transported to CT 

## 2021-11-08 DIAGNOSIS — R319 Hematuria, unspecified: Secondary | ICD-10-CM | POA: Diagnosis not present

## 2021-11-08 DIAGNOSIS — N390 Urinary tract infection, site not specified: Secondary | ICD-10-CM | POA: Diagnosis present

## 2021-11-08 DIAGNOSIS — Z8249 Family history of ischemic heart disease and other diseases of the circulatory system: Secondary | ICD-10-CM | POA: Diagnosis not present

## 2021-11-08 DIAGNOSIS — K5900 Constipation, unspecified: Secondary | ICD-10-CM | POA: Diagnosis present

## 2021-11-08 DIAGNOSIS — Z79899 Other long term (current) drug therapy: Secondary | ICD-10-CM | POA: Diagnosis not present

## 2021-11-08 DIAGNOSIS — I10 Essential (primary) hypertension: Secondary | ICD-10-CM | POA: Diagnosis present

## 2021-11-08 DIAGNOSIS — N10 Acute tubulo-interstitial nephritis: Secondary | ICD-10-CM | POA: Diagnosis not present

## 2021-11-08 DIAGNOSIS — I4819 Other persistent atrial fibrillation: Secondary | ICD-10-CM | POA: Diagnosis present

## 2021-11-08 DIAGNOSIS — G2581 Restless legs syndrome: Secondary | ICD-10-CM | POA: Diagnosis present

## 2021-11-08 DIAGNOSIS — Z888 Allergy status to other drugs, medicaments and biological substances status: Secondary | ICD-10-CM | POA: Diagnosis not present

## 2021-11-08 DIAGNOSIS — J4520 Mild intermittent asthma, uncomplicated: Secondary | ICD-10-CM | POA: Diagnosis present

## 2021-11-08 DIAGNOSIS — E1165 Type 2 diabetes mellitus with hyperglycemia: Secondary | ICD-10-CM | POA: Diagnosis present

## 2021-11-08 DIAGNOSIS — I4892 Unspecified atrial flutter: Secondary | ICD-10-CM | POA: Diagnosis present

## 2021-11-08 DIAGNOSIS — E876 Hypokalemia: Secondary | ICD-10-CM | POA: Diagnosis present

## 2021-11-08 DIAGNOSIS — Z952 Presence of prosthetic heart valve: Secondary | ICD-10-CM | POA: Diagnosis not present

## 2021-11-08 DIAGNOSIS — E7800 Pure hypercholesterolemia, unspecified: Secondary | ICD-10-CM | POA: Diagnosis present

## 2021-11-08 DIAGNOSIS — Z7901 Long term (current) use of anticoagulants: Secondary | ICD-10-CM | POA: Diagnosis not present

## 2021-11-08 DIAGNOSIS — N136 Pyonephrosis: Secondary | ICD-10-CM | POA: Diagnosis present

## 2021-11-08 DIAGNOSIS — M069 Rheumatoid arthritis, unspecified: Secondary | ICD-10-CM | POA: Diagnosis present

## 2021-11-08 DIAGNOSIS — Z823 Family history of stroke: Secondary | ICD-10-CM | POA: Diagnosis not present

## 2021-11-08 DIAGNOSIS — K219 Gastro-esophageal reflux disease without esophagitis: Secondary | ICD-10-CM | POA: Diagnosis present

## 2021-11-08 DIAGNOSIS — Z7984 Long term (current) use of oral hypoglycemic drugs: Secondary | ICD-10-CM | POA: Diagnosis not present

## 2021-11-08 DIAGNOSIS — Z950 Presence of cardiac pacemaker: Secondary | ICD-10-CM | POA: Diagnosis not present

## 2021-11-08 DIAGNOSIS — Z8042 Family history of malignant neoplasm of prostate: Secondary | ICD-10-CM | POA: Diagnosis not present

## 2021-11-08 LAB — URINE CULTURE: Culture: NO GROWTH

## 2021-11-08 LAB — CBC
HCT: 36.4 % (ref 36.0–46.0)
Hemoglobin: 12 g/dL (ref 12.0–15.0)
MCH: 31.4 pg (ref 26.0–34.0)
MCHC: 33 g/dL (ref 30.0–36.0)
MCV: 95.3 fL (ref 80.0–100.0)
Platelets: 156 10*3/uL (ref 150–400)
RBC: 3.82 MIL/uL — ABNORMAL LOW (ref 3.87–5.11)
RDW: 13 % (ref 11.5–15.5)
WBC: 10.5 10*3/uL (ref 4.0–10.5)
nRBC: 0 % (ref 0.0–0.2)

## 2021-11-08 LAB — BASIC METABOLIC PANEL
Anion gap: 7 (ref 5–15)
BUN: 14 mg/dL (ref 8–23)
CO2: 25 mmol/L (ref 22–32)
Calcium: 9.2 mg/dL (ref 8.9–10.3)
Chloride: 107 mmol/L (ref 98–111)
Creatinine, Ser: 1.07 mg/dL — ABNORMAL HIGH (ref 0.44–1.00)
GFR, Estimated: 55 mL/min — ABNORMAL LOW (ref >60)
Glucose, Bld: 134 mg/dL — ABNORMAL HIGH (ref 70–99)
Potassium: 3.7 mmol/L (ref 3.5–5.1)
Sodium: 139 mmol/L (ref 135–145)

## 2021-11-08 LAB — GLUCOSE, CAPILLARY
Glucose-Capillary: 180 mg/dL — ABNORMAL HIGH (ref 70–99)
Glucose-Capillary: 131 mg/dL — ABNORMAL HIGH (ref 70–99)
Glucose-Capillary: 136 mg/dL — ABNORMAL HIGH (ref 70–99)
Glucose-Capillary: 142 mg/dL — ABNORMAL HIGH (ref 70–99)

## 2021-11-08 MED ORDER — GUAIFENESIN 100 MG/5ML PO LIQD: 5.0000 mL | ORAL | Status: DC | PRN

## 2021-11-08 MED ORDER — POLYETHYLENE GLYCOL 3350 17 G PO PACK: 17.0000 g | PACK | Freq: Every day | ORAL | Status: DC | PRN

## 2021-11-08 MED ORDER — METOPROLOL TARTRATE 5 MG/5ML IV SOLN: 5.0000 mg | INTRAVENOUS | Status: DC | PRN

## 2021-11-08 MED ORDER — POLYETHYLENE GLYCOL 3350 17 G PO PACK
17.0000 g | PACK | Freq: Every day | ORAL | Status: DC
  Administered 2021-11-08 – 2021-11-09 (×2): 17 g via ORAL
  Filled 2021-11-08 (×2): qty 1

## 2021-11-08 MED ORDER — DOCUSATE SODIUM 100 MG PO CAPS
100.0000 mg | ORAL_CAPSULE | Freq: Two times a day (BID) | ORAL | Status: DC
  Administered 2021-11-08 – 2021-11-09 (×3): 100 mg via ORAL
  Filled 2021-11-08 (×3): qty 1

## 2021-11-08 MED ORDER — POLYETHYLENE GLYCOL 3350 17 G PO PACK
17.0000 g | PACK | Freq: Once | ORAL | Status: AC
  Administered 2021-11-08: 17 g via ORAL
  Filled 2021-11-08: qty 1

## 2021-11-08 MED ORDER — TRAZODONE HCL 50 MG PO TABS
50.0000 mg | ORAL_TABLET | Freq: Every evening | ORAL | Status: DC | PRN
  Administered 2021-11-08: 50 mg via ORAL
  Filled 2021-11-08: qty 1

## 2021-11-08 MED ORDER — DICLOFENAC SODIUM 1 % EX GEL
2.0000 g | Freq: Four times a day (QID) | CUTANEOUS | Status: DC
  Administered 2021-11-08 – 2021-11-09 (×6): 2 g via TOPICAL
  Filled 2021-11-08: qty 100

## 2021-11-08 MED ORDER — IPRATROPIUM-ALBUTEROL 0.5-2.5 (3) MG/3ML IN SOLN: 3.0000 mL | RESPIRATORY_TRACT | Status: DC | PRN

## 2021-11-08 MED ORDER — SENNOSIDES-DOCUSATE SODIUM 8.6-50 MG PO TABS: 1.0000 | ORAL_TABLET | Freq: Every evening | ORAL | Status: DC | PRN

## 2021-11-08 MED ORDER — SODIUM CHLORIDE 0.9 % IV SOLN: INTRAVENOUS | Status: AC

## 2021-11-08 MED ORDER — HYDRALAZINE HCL 20 MG/ML IJ SOLN: 10.0000 mg | INTRAMUSCULAR | Status: DC | PRN

## 2021-11-08 NOTE — Progress Notes (Signed)
Pt took home med... informed to not take home meds without permission and proper authorization.Marland Kitchen pt aware, educated, night meds given with acceptance of med taken already. All needs addressed

## 2021-11-08 NOTE — Progress Notes (Signed)
Mobility Specialist Progress Note:   11/08/21 1336  Mobility  Activity Ambulated with assistance in room  Level of Assistance Minimal assist, patient does 75% or more  Assistive Device Front wheel walker  Distance Ambulated (ft) 30 ft  Activity Response Tolerated well  Mobility Referral Yes  $Mobility charge 1 Mobility   Pt received in bed and agreeable. MinA to stand from bed and toilet. Independent with [employer]. Pt was unsteady and shaking upon standing, but able to steady self after a few minutes. No complaints of pain or SOB. Pt left in bed with all needs met and call bell in reach.   Marguerite Barba Mobility Specialist-Acute Rehab Secure Chat only

## 2021-11-08 NOTE — Consult Note (Addendum)
Subjective: No acute events overnight. Patient notes pain has improved and feels better this morning. Only complaint is feeling constipated. Afebrile and stable.   Objective: Vital signs in last 24 hours: Temp:  [97.7 F (36.5 C)-98.4 F (36.9 C)] 97.7 F (36.5 C) (10/09 0452) Pulse Rate:  [57-70] 63 (10/09 0452) Resp:  [10-20] 16 (10/09 0452) BP: (119-201)/(53-158) 119/75 (10/09 0452) SpO2:  [92 %-100 %] 97 % (10/09 0452)  Intake/Output from previous day: 10/08 0701 - 10/09 0700 In: 202.4 [P.O.:100; IV Piggyback:102.4] Out: -  Intake/Output this shift: No intake/output data recorded.  Physical Exam:  General: Alert and oriented CV: Regular rate Lungs: Normal work of breathing  Abdomen: Soft, Nondistended, tenderness to palpation has improved GU: Voiding spontaneously   Lab Results: Recent Labs    11/06/21 0348 11/07/21 0051 11/08/21 0008  HGB 12.3 13.5 12.0  HCT 36.8 39.6 36.4   BMET Recent Labs    11/07/21 0051 11/08/21 0008  NA 142 139  K 3.4* 3.7  CL 107 107  CO2 24 25  GLUCOSE 239* 134*  BUN 13 14  CREATININE 0.90 1.07*  CALCIUM 9.8 9.2     Studies/Results: CT Renal Stone Study  Result Date: 11/07/2021 CLINICAL DATA:  Right flank pain, right hydronephrosis seen in the sonogram EXAM: CT ABDOMEN AND PELVIS WITHOUT CONTRAST TECHNIQUE: Multidetector CT imaging of the abdomen and pelvis was performed following the standard protocol without IV contrast. RADIATION DOSE REDUCTION: This exam was performed according to the departmental dose-optimization program which includes automated exposure control, adjustment of the mA and/or kV according to patient size and/or use of iterative reconstruction technique. COMPARISON:  Previous CT done on 11/04/2021 and sonogram done earlier today FINDINGS: Lower chest: Small patchy infiltrate is seen in left lower lobe. Coronary artery calcifications are seen. There is prosthetic aortic valve. Dense calcification is seen in  mitral annulus. Cardiac pacer leads are noted in place. Hepatobiliary: In image 18 of series 3, there is subcentimeter low-density in the right lobe of liver, possibly cysts or hemangiomas. There is no dilation of bile ducts. Gallbladder is not distended. Pancreas: No focal abnormalities are seen. Spleen: Unremarkable. Adrenals/Urinary Tract: Adrenals are unremarkable. There is mild right hydronephrosis. There is mild to moderate dilation of proximal course of right ureter. There is mild right perinephric stranding. There are 2 small calcific densities in the upper pole of right kidney. Possible tiny calcifications are seen in the lower pole of left kidney. There is no demonstrable opaque calculus in the course of the right ureter. There are multiple round calcifications in both sides of pelvis, possibly vascular calcifications. There is no hydronephrosis on the left side. Stomach/Bowel: Stomach is unremarkable. Small bowel loops are not dilated. Appendix is unremarkable. There is no significant wall thickening in colon. Few diverticula are seen in colon without signs of focal diverticulitis. Vascular/Lymphatic: Scattered arterial calcifications are seen. Reproductive: Uterus is unremarkable. There is 1.5 cm low-density in right side of pelvis, possibly ovarian follicle. Other: There is no ascites or pneumoperitoneum. Umbilical hernia containing fat is seen. Musculoskeletal: Degenerative changes are noted in lumbar spine with disc space narrowing, bony spurs and facet hypertrophy at multiple levels. There is encroachment of neural foramina from L3-S1 levels. IMPRESSION: There is mild to moderate right hydronephrosis. There is right perinephric stranding. There is no demonstrable opaque calculus in the course of the right ureter. Findings may suggest recent passage of ureteral calculus or non opaque calculus in the distal right ureter. There are few small  calcifications in both kidneys each measuring less than 3 mm  suggesting renal stones. There is right perinephric stranding which may be due to ureteric obstruction or superimposed infection. There is no evidence of intestinal obstruction or pneumoperitoneum. Appendix is not dilated. Scattered diverticula are seen in colon without signs of focal diverticulitis. Lumbar spondylosis. Small patchy infiltrate in left lower lung fields may suggest atelectasis/pneumonia. Other findings as described in the body of the report. Electronically Signed   By: Elmer Picker M.D.   On: 11/07/2021 12:53   US Pelvis Complete  Result Date: 11/07/2021 CLINICAL DATA:  76 year old female with pelvic pain. EXAM: TRANSABDOMINAL ULTRASOUND OF PELVIS TECHNIQUE: Transabdominal ultrasound examination of the pelvis was performed including evaluation of the uterus, ovaries, adnexal regions, and pelvic cul-de-sac. Patient declined transvaginal exam. COMPARISON:  11/04/2021 CT FINDINGS: Uterus Measurements: Anteverted measuring 7.8 x 1.9 x 3.5 cm = volume: 27 mL. No fibroids or other mass visualized. Endometrium Thickness: 2.3 mm.  No focal abnormality visualized. Right ovary Not visualized.  No adnexal mass. Left ovary Not visualized.  No adnexal mass. Other findings:  No abnormal free fluid. IMPRESSION: 1. Unremarkable uterus. 2. Ovaries not visualized. No free fluid or adnexal mass. Electronically Signed   By: Margarette Canada M.D.   On: 11/07/2021 11:06   US Abdomen Limited RUQ (LIVER/GB)  Result Date: 11/07/2021 CLINICAL DATA:  Right upper quadrant pain. Per technologist report, limited exam due to body habitus. EXAM: ULTRASOUND ABDOMEN LIMITED RIGHT UPPER QUADRANT COMPARISON:  CT abdomen and pelvis November 04, 2021, abdominal ultrasound November 04, 2021 FINDINGS: Gallbladder: At least 1 shadowing gallstone is visualized. No gallbladder wall thickening or pericholecystic fluid. No sonographic Murphy sign noted by sonographer. Common bile duct: Diameter: 4 mm Liver: No focal lesion identified.  Increased liver parenchymal echogenicity. Portal vein is patent on color Doppler imaging with normal direction of blood flow towards the liver. Other: Mild right hydronephrosis. IMPRESSION: 1. Cholelithiasis without evidence of acute cholecystitis. 2. Incidentally visualized mild right hydronephrosis. Electronically Signed   By: Beryle Flock M.D.   On: 11/07/2021 11:03   DG Chest 2 View  Result Date: 11/07/2021 CLINICAL DATA:  Shortness of breath EXAM: CHEST - 2 VIEW COMPARISON:  Chest x-ray October 29, 2020, chest CT Jun 22, 2020 FINDINGS: Intact left chest wall single lead pacemaker. Unchanged cardiomediastinal contours. Mild diffuse interstitial pulmonary opacities. There is also a more focal left retrocardiac opacity. No large pleural effusion or pneumothorax. No acute osseous abnormality. The visualized upper abdomen is unremarkable. IMPRESSION: 1. Mild pulmonary edema. 2. There is a more focal left retrocardiac opacity, possibly reflecting edema or atelectasis. However, aspiration/infection is also possible in the appropriate clinical context. Electronically Signed   By: Beryle Flock M.D.   On: 11/07/2021 09:29    Assessment/Plan: 76 year old female with history of HLD, atrial fibrillation s/p TAVR/pacemaker on Eliquis who presented with persistent right sided abdominal pain. CT scan with mild right hydronephrosis with proximal ureteral dilation and perinephric stranding, small non obstructing stone in right kidney, suggesting recently passed stone.  Pain improving with conservative management.  - Continue fluids, pain control, flomax for conservative management. - Agree with starting bowel regimen for patient due to constipation.  - Patient will need to follow up with urology in outpatient setting for repeat imaging.    LOS: 0 days   Obafunbi Abimbola 11/08/2021, 7:36 AM  I have seen and examined the patient and agree with the above assessment and plan.  Pt with improved  pain.  Continue conservative management as above. Will arrange f/u outpatient to check renal ultrasound. Please call with questions.  Matt R. [facility]  Pager: [phone]

## 2021-11-08 NOTE — Progress Notes (Signed)
Pt complains of constipation. PRN colace given.   Placed purewick on pt. Unable to get up due to pain. Has not urinated, bladder scan x 2 shows 0 ml.   Hospitalist contacted, new orders for Miralax

## 2021-11-08 NOTE — Progress Notes (Signed)
PROGRESS NOTE    Briana Foster  [id number] DOB: 10/30/1945 DOA: 11/07/2021 PCP: Wenda Low, MD   Brief Narrative:  76 y.o. female with medical history significant of HTN, PAF on Eliquis, aortic stenosis status post TAVR, mild intermittent asthma, AV node ablation and PPM, presented to ED with recurrent right-sided abdominal pain. Patient was recently hospitalized for similar presentation, with right lower quadrant abdominal pain and nausea, after her normal GI study including MRCP, she was discharged home.  Initially she was stable, but last night, patient started to have cramping like right lower quadrant abdominal pain again 10/10 radiating to the right groin area.  CT showed moderate right-sided hydronephrosis with evidence of pyelonephritis and recent passage of renal stone.  Urology consulted who recommended conservative management at this time.   Assessment & Plan:  Principal Problem:   UTI (urinary tract infection) Active Problems:   Acute pyelonephritis    Acute pyelonephritis with hematuria -Worsening flank pain on the right side.  CT shows evidence of moderate hydronephrosis with possible recent passage of renal stone.  Urology recommending empiric IV Rocephin, Flomax, IV fluids. 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  On Flomax and urology follow-up.   Hypokalemia -As needed repletion   IIDM, with hyperglycemia -Most recent A1c 8.9 indicating poorly controlled baseline diabetes -Metformin on hold.  Sliding scale and Accu-Cheks   Chronic atrial flutter with status post AVablation and ventricle pacemaker -Paced rhythm.  Digoxin and Eliquis    HTN -Currently on Norvasc, Cardizem.  IV as needed ordered   Restless leg syndrome -Ropinirole   DVT prophylaxis: Eliquis Code Status: Full code Family  Communication:    Maintain hospital stay for further antibiotic as she is still having abdominal pain and discomfort.  Hopefully discharge in next 24 hours    Subjective: Due to abdominal pain and discomfort radiating to her right groin she has poor oral intake.  We will maintain IV fluids and IV antibiotics today   Examination:  General exam: Appears calm and comfortable  Respiratory system: Clear to auscultation. Respiratory effort normal. Cardiovascular system: S1 & S2 heard, RRR. No JVD, murmurs, rubs, gallops or clicks. No pedal edema. Gastrointestinal system: Abdomen is nondistended, soft and nontender. No organomegaly or masses felt. Normal bowel sounds heard. Central nervous system: Alert and oriented. No focal neurological deficits. Extremities: Symmetric 5 x 5 power. Skin: No rashes, lesions or ulcers Psychiatry: Judgement and insight appear normal. Mood & affect appropriate.   External female catheter  Objective: Vitals:   11/07/21 2027 11/07/21 2049 11/08/21 0003 11/08/21 0452  BP:  (!) 123/57 (!) 134/53 119/75  Pulse:  60 61 63  Resp:  16 17 16   Temp: 97.8 F (36.6 C) 98.2 F (36.8 C) 98.4 F (36.9 C) 97.7 F (36.5 C)  TempSrc: Oral Oral Oral Oral  SpO2:  96% 92% 97%    Intake/Output Summary (Last 24 hours) at 11/08/2021 [id number] Last data filed at 11/07/2021 2300 Gross per 24 hour  Intake 202.44 ml  Output --  Net 202.44 ml   There were no vitals filed for this visit.   Data Reviewed:   CBC: Recent Labs  Lab 11/04/21 1115 11/05/21 0452 11/06/21 0348 11/07/21 0051 11/08/21 0008  WBC 11.0* 8.3 7.2 9.9 10.5  NEUTROABS 9.3*  --   --   --   --   HGB 14.2 12.5 12.3 13.5  12.0  HCT 41.7 37.5 36.8 39.6 36.4  MCV 93.1 93.5 94.6 93.8 95.3  PLT 172 156 155 181 [id number]   Basic Metabolic Panel: Recent Labs  Lab 11/04/21 1115 11/06/21 0348 11/07/21 0051 11/08/21 0008  NA 138 140 142 139  K 3.5 3.8 3.4* 3.7  CL 106 105 107 107  CO2 21* 24 24 25   GLUCOSE  305* 170* 239* 134*  BUN 14 14 13 14   CREATININE 0.88 1.08* 0.90 1.07*  CALCIUM 9.4 9.4 9.8 9.2   GFR: Estimated Creatinine Clearance: 52.5 mL/min (A) (by C-G formula based on SCr of 1.07 mg/dL (H)). Liver Function Tests: Recent Labs  Lab 11/04/21 1115 11/05/21 0452 11/06/21 0348 11/07/21 0051  AST 20 18 19 27   ALT 22 20 23 27   ALKPHOS 65 60 57 64  BILITOT 0.6 1.1 0.7 0.8  PROT 6.9 6.2* 6.4* 7.4  ALBUMIN 3.8 3.4* 3.4* 4.1   Recent Labs  Lab 11/04/21 1115 11/07/21 0051  LIPASE 28 30   No results for input(s): "AMMONIA" in the last 168 hours. Coagulation Profile: No results for input(s): "INR", "PROTIME" in the last 168 hours. Cardiac Enzymes: No results for input(s): "CKTOTAL", "CKMB", "CKMBINDEX", "TROPONINI" in the last 168 hours. BNP (last 3 results) No results for input(s): "PROBNP" in the last 8760 hours. HbA1C: No results for input(s): "HGBA1C" in the last 72 hours. CBG: Recent Labs  Lab 11/05/21 1237 11/05/21 1540 11/06/21 0833 11/06/21 1234 11/07/21 1811  GLUCAP 136* 101* 160* 216* 187*   Lipid Profile: No results for input(s): "CHOL", "HDL", "LDLCALC", "TRIG", "CHOLHDL", "LDLDIRECT" in the last 72 hours. Thyroid Function Tests: No results for input(s): "TSH", "T4TOTAL", "FREET4", "T3FREE", "THYROIDAB" in the last 72 hours. Anemia Panel: No results for input(s): "VITAMINB12", "FOLATE", "FERRITIN", "TIBC", "IRON", "RETICCTPCT" in the last 72 hours. Sepsis Labs: No results for input(s): "PROCALCITON", "LATICACIDVEN" in the last 168 hours.  No results found for this or any previous visit (from the past 240 hour(s)).       Radiology Studies: CT Renal Stone Study  Result Date: 11/07/2021 CLINICAL DATA:  Right flank pain, right hydronephrosis seen in the sonogram EXAM: CT ABDOMEN AND PELVIS WITHOUT CONTRAST TECHNIQUE: Multidetector CT imaging of the abdomen and pelvis was performed following the standard protocol without IV contrast. RADIATION DOSE  REDUCTION: This exam was performed according to the departmental dose-optimization program which includes automated exposure control, adjustment of the mA and/or kV according to patient size and/or use of iterative reconstruction technique. COMPARISON:  Previous CT done on 11/04/2021 and sonogram done earlier today FINDINGS: Lower chest: Small patchy infiltrate is seen in left lower lobe. Coronary artery calcifications are seen. There is prosthetic aortic valve. Dense calcification is seen in mitral annulus. Cardiac pacer leads are noted in place. Hepatobiliary: In image 18 of series 3, there is subcentimeter low-density in the right lobe of liver, possibly cysts or hemangiomas. There is no dilation of bile ducts. Gallbladder is not distended. Pancreas: No focal abnormalities are seen. Spleen: Unremarkable. Adrenals/Urinary Tract: Adrenals are unremarkable. There is mild right hydronephrosis. There is mild to moderate dilation of proximal course of right ureter. There is mild right perinephric stranding. There are 2 small calcific densities in the upper pole of right kidney. Possible tiny calcifications are seen in the lower pole of left kidney. There is no demonstrable opaque calculus in the course of the right ureter. There are multiple round calcifications in both sides of pelvis, possibly vascular calcifications. There is no hydronephrosis on  the left side. Stomach/Bowel: Stomach is unremarkable. Small bowel loops are not dilated. Appendix is unremarkable. There is no significant wall thickening in colon. Few diverticula are seen in colon without signs of focal diverticulitis. Vascular/Lymphatic: Scattered arterial calcifications are seen. Reproductive: Uterus is unremarkable. There is 1.5 cm low-density in right side of pelvis, possibly ovarian follicle. Other: There is no ascites or pneumoperitoneum. Umbilical hernia containing fat is seen. Musculoskeletal: Degenerative changes are noted in lumbar spine with  disc space narrowing, bony spurs and facet hypertrophy at multiple levels. There is encroachment of neural foramina from L3-S1 levels. IMPRESSION: There is mild to moderate right hydronephrosis. There is right perinephric stranding. There is no demonstrable opaque calculus in the course of the right ureter. Findings may suggest recent passage of ureteral calculus or non opaque calculus in the distal right ureter. There are few small calcifications in both kidneys each measuring less than 3 mm suggesting renal stones. There is right perinephric stranding which may be due to ureteric obstruction or superimposed infection. There is no evidence of intestinal obstruction or pneumoperitoneum. Appendix is not dilated. Scattered diverticula are seen in colon without signs of focal diverticulitis. Lumbar spondylosis. Small patchy infiltrate in left lower lung fields may suggest atelectasis/pneumonia. Other findings as described in the body of the report. Electronically Signed   By: Elmer Picker M.D.   On: 11/07/2021 12:53   US Pelvis Complete  Result Date: 11/07/2021 CLINICAL DATA:  76 year old female with pelvic pain. EXAM: TRANSABDOMINAL ULTRASOUND OF PELVIS TECHNIQUE: Transabdominal ultrasound examination of the pelvis was performed including evaluation of the uterus, ovaries, adnexal regions, and pelvic cul-de-sac. Patient declined transvaginal exam. COMPARISON:  11/04/2021 CT FINDINGS: Uterus Measurements: Anteverted measuring 7.8 x 1.9 x 3.5 cm = volume: 27 mL. No fibroids or other mass visualized. Endometrium Thickness: 2.3 mm.  No focal abnormality visualized. Right ovary Not visualized.  No adnexal mass. Left ovary Not visualized.  No adnexal mass. Other findings:  No abnormal free fluid. IMPRESSION: 1. Unremarkable uterus. 2. Ovaries not visualized. No free fluid or adnexal mass. Electronically Signed   By: Margarette Canada M.D.   On: 11/07/2021 11:06   US Abdomen Limited RUQ (LIVER/GB)  Result Date:  11/07/2021 CLINICAL DATA:  Right upper quadrant pain. Per technologist report, limited exam due to body habitus. EXAM: ULTRASOUND ABDOMEN LIMITED RIGHT UPPER QUADRANT COMPARISON:  CT abdomen and pelvis November 04, 2021, abdominal ultrasound November 04, 2021 FINDINGS: Gallbladder: At least 1 shadowing gallstone is visualized. No gallbladder wall thickening or pericholecystic fluid. No sonographic Murphy sign noted by sonographer. Common bile duct: Diameter: 4 mm Liver: No focal lesion identified. Increased liver parenchymal echogenicity. Portal vein is patent on color Doppler imaging with normal direction of blood flow towards the liver. Other: Mild right hydronephrosis. IMPRESSION: 1. Cholelithiasis without evidence of acute cholecystitis. 2. Incidentally visualized mild right hydronephrosis. Electronically Signed   By: Beryle Flock M.D.   On: 11/07/2021 11:03   DG Chest 2 View  Result Date: 11/07/2021 CLINICAL DATA:  Shortness of breath EXAM: CHEST - 2 VIEW COMPARISON:  Chest x-ray October 29, 2020, chest CT Jun 22, 2020 FINDINGS: Intact left chest wall single lead pacemaker. Unchanged cardiomediastinal contours. Mild diffuse interstitial pulmonary opacities. There is also a more focal left retrocardiac opacity. No large pleural effusion or pneumothorax. No acute osseous abnormality. The visualized upper abdomen is unremarkable. IMPRESSION: 1. Mild pulmonary edema. 2. There is a more focal left retrocardiac opacity, possibly reflecting edema or atelectasis. However, aspiration/infection is  also possible in the appropriate clinical context. Electronically Signed   By: Beryle Flock M.D.   On: 11/07/2021 09:29        Scheduled Meds:  amLODipine  10 mg Oral Daily   apixaban  5 mg Oral BID   digoxin  0.125 mg Oral Daily   diltiazem  60 mg Oral Q8H   insulin aspart  0-20 Units Subcutaneous TID WC   insulin aspart  0-5 Units Subcutaneous QHS   loratadine  10 mg Oral Daily   rOPINIRole  4 mg Oral  QHS   rosuvastatin  20 mg Oral QODAY   Continuous Infusions:  sodium chloride 125 mL/hr at 11/08/21 0452   cefTRIAXone (ROCEPHIN)  IV Stopped (11/07/21 1546)     LOS: 0 days   Time spent= 35 mins    Ellenora Talton Arsenio Loader, MD [facility]  If 7PM-7AM, please contact night-coverage  11/08/2021, 7:28 AM

## 2021-11-08 NOTE — Progress Notes (Signed)
[facility] night cross cover note:   I was notified by RN of the patient's ongoing constipation following prn colace and associated request for escalation of oral bowel regimen. I placed order for Miralax 17 g PO x 1 dose now.     Babs Bertin, DO Hospitalist

## 2021-11-09 ENCOUNTER — Other Ambulatory Visit (HOSPITAL_COMMUNITY): Payer: Self-pay

## 2021-11-09 LAB — BASIC METABOLIC PANEL
Anion gap: 9 (ref 5–15)
BUN: 14 mg/dL (ref 8–23)
CO2: 23 mmol/L (ref 22–32)
Calcium: 9.1 mg/dL (ref 8.9–10.3)
Chloride: 105 mmol/L (ref 98–111)
Creatinine, Ser: 0.81 mg/dL (ref 0.44–1.00)
GFR, Estimated: >60 mL/min (ref >60)
Glucose, Bld: 148 mg/dL — ABNORMAL HIGH (ref 70–99)
Potassium: 3.8 mmol/L (ref 3.5–5.1)
Sodium: 137 mmol/L (ref 135–145)

## 2021-11-09 LAB — CBC
HCT: 34.9 % — ABNORMAL LOW (ref 36.0–46.0)
Hemoglobin: 11.2 g/dL — ABNORMAL LOW (ref 12.0–15.0)
MCH: 31 pg (ref 26.0–34.0)
MCHC: 32.1 g/dL (ref 30.0–36.0)
MCV: 96.7 fL (ref 80.0–100.0)
Platelets: 154 10*3/uL (ref 150–400)
RBC: 3.61 MIL/uL — ABNORMAL LOW (ref 3.87–5.11)
RDW: 12.9 % (ref 11.5–15.5)
WBC: 8.2 10*3/uL (ref 4.0–10.5)
nRBC: 0 % (ref 0.0–0.2)

## 2021-11-09 LAB — GLUCOSE, CAPILLARY
Glucose-Capillary: 116 mg/dL — ABNORMAL HIGH (ref 70–99)
Glucose-Capillary: 125 mg/dL — ABNORMAL HIGH (ref 70–99)
Glucose-Capillary: 160 mg/dL — ABNORMAL HIGH (ref 70–99)
Glucose-Capillary: 129 mg/dL — ABNORMAL HIGH (ref 70–99)

## 2021-11-09 LAB — MAGNESIUM: Magnesium: 1.6 mg/dL — ABNORMAL LOW (ref 1.7–2.4)

## 2021-11-09 MED ORDER — CEPHALEXIN 500 MG PO CAPS
500.0000 mg | ORAL_CAPSULE | Freq: Four times a day (QID) | ORAL | 0 refills | Status: AC
  Filled 2021-11-09: qty 20, 5d supply, fill #0

## 2021-11-09 MED ORDER — LACTULOSE 10 GM/15ML PO SOLN
30.0000 g | ORAL | Status: AC
  Administered 2021-11-09: 30 g via ORAL
  Filled 2021-11-09 (×2): qty 45

## 2021-11-09 MED ORDER — TAMSULOSIN HCL 0.4 MG PO CAPS
0.4000 mg | ORAL_CAPSULE | Freq: Every day | ORAL | 0 refills | Status: DC
  Filled 2021-11-09: qty 30, 30d supply, fill #0

## 2021-11-09 MED ORDER — POTASSIUM CHLORIDE CRYS ER 20 MEQ PO TBCR
20.0000 meq | EXTENDED_RELEASE_TABLET | Freq: Once | ORAL | Status: AC
  Administered 2021-11-09: 20 meq via ORAL
  Filled 2021-11-09: qty 1

## 2021-11-09 MED ORDER — DOCUSATE SODIUM 100 MG PO CAPS
100.0000 mg | ORAL_CAPSULE | Freq: Two times a day (BID) | ORAL | 0 refills | Status: DC
  Filled 2021-11-09: qty 10, 5d supply, fill #0

## 2021-11-09 MED ORDER — POLYETHYLENE GLYCOL 3350 17 GM/SCOOP PO POWD
17.0000 g | Freq: Two times a day (BID) | ORAL | 0 refills | Status: DC | PRN
  Filled 2021-11-09: qty 238, 7d supply, fill #0

## 2021-11-09 MED ORDER — DICLOFENAC SODIUM 1 % EX GEL
2.0000 g | Freq: Four times a day (QID) | CUTANEOUS | 0 refills | Status: DC
  Filled 2021-11-09: qty 100, fill #0

## 2021-11-09 MED ORDER — MAGNESIUM SULFATE 4 GM/100ML IV SOLN
4.0000 g | Freq: Once | INTRAVENOUS | Status: AC
  Administered 2021-11-09: 4 g via INTRAVENOUS
  Filled 2021-11-09: qty 100

## 2021-11-09 NOTE — Discharge Summary (Signed)
Physician Discharge Summary  Briana Foster [id number] DOB: [birth year] DOA: 11/07/2021  PCP: Wenda Low, MD  Admit date: 11/07/2021 Discharge date: 11/09/2021  Admitted From: Home Disposition: Home  Recommendations for Outpatient Follow-up:  Follow up with PCP in 1-2 weeks Please obtain BMP/CBC in one week your next doctors visit.  Oral Keflex for 5 more days Bowel regimen prescribed Voltaren gel for her bilateral knees Flomax daily. Outpatient follow-up with urology   Discharge Condition: Stable CODE STATUS: Full code Diet recommendation: Regular  Brief/Interim Summary: 76 y.o. female with medical history significant of HTN, PAF on Eliquis, aortic stenosis status post TAVR, mild intermittent asthma, AV node ablation and PPM, presented to ED with recurrent right-sided abdominal pain. Patient was recently hospitalized for similar presentation, with right lower quadrant abdominal pain and nausea, after her normal GI study including MRCP, she was discharged home.  Initially she was stable, but last night, patient started to have cramping like right lower quadrant abdominal pain again 10/10 radiating to the right groin area.  CT showed moderate right-sided hydronephrosis with evidence of pyelonephritis and recent passage of renal stone.  Urology consulted who recommended conservative management at this time.  Patient improved with conservative management, blood cultures remain negative.  We will discharge patient today with recommendations as stated above.  Germain Osgood, no answer. Tried calling the other number but patient herself answers.   Assessment & Plan:  Principal Problem:   UTI (urinary tract infection) Active Problems:   Acute pyelonephritis     Acute pyelonephritis with hematuria -Worsening flank pain on the right side.  CT shows evidence of moderate hydronephrosis with possible recent passage of renal stone.  Urology recommending empiric IV Rocephin, Flomax, IV  fluids.  Will transition to oral Keflex for 5 more days, prescribed Flomax.  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  On Flomax and urology follow-up.   Hypokalemia -As needed repletion   IIDM, with hyperglycemia -Most recent A1c 8.9 indicating poorly controlled baseline diabetes - Resume home regimen   Chronic atrial flutter with status post AVablation and ventricle pacemaker -Paced rhythm.  Digoxin and Eliquis    HTN -Currently on Norvasc, Cardizem.  Patient states she is on both calcium channel blockers   Restless leg syndrome -Ropinirole      Discharge Diagnoses:  Principal Problem:   UTI (urinary tract infection) Active Problems:   Acute pyelonephritis      Consultations: Urology  Subjective: Patient feels great no complaints.  Discharge Exam: Vitals:   11/09/21 0433 11/09/21 0736  BP: (!) 143/69 (!) 148/70  Pulse: 62 63  Resp: 19 17  Temp: 98.7 F (37.1 C) 98.8 F (37.1 C)  SpO2: 95% 90%   Vitals:   11/08/21 1544 11/08/21 1957 11/09/21 0433 11/09/21 0736  BP: 125/65 (!) 141/82 (!) 143/69 (!) 148/70  Pulse: (!) 59 62 62 63  Resp: 18 19 19 17   Temp: 98.2 F (36.8 C) 98.9 F (37.2 C) 98.7 F (37.1 C) 98.8 F (37.1 C)  TempSrc: Oral Oral Oral Oral  SpO2: 97% 97% 95% 90%    General: Pt is alert, awake, not in acute distress Cardiovascular: RRR, S1/S2 +, no rubs, no gallops Respiratory: CTA bilaterally, no wheezing, no rhonchi Abdominal: Soft, NT, ND, bowel sounds + Extremities: no edema, no cyanosis  Discharge Instructions   Allergies as of 11/09/2021  Reactions   Bee Venom Shortness Of Breath, Rash   Claritin [loratadine] Itching, Palpitations   Wasp Venom Other (See Comments), Anaphylaxis   Apricot Flavor Swelling   Eyes swell shut   Atorvastatin  Itching   Eye swelling Other reaction(s): HA        Medication List     TAKE these medications    acetaminophen 500 MG tablet Commonly known as: TYLENOL Take 500-1,000 mg by mouth every 8 (eight) hours as needed for moderate pain.   albuterol 108 (90 Base) MCG/ACT inhaler Commonly known as: VENTOLIN HFA Inhale 2 puffs into the lungs every 4 (four) hours as needed for wheezing or shortness of breath.   amLODipine 10 MG tablet Commonly known as: NORVASC Take 1 tablet (10 mg total) by mouth daily.   apixaban 5 MG Tabs tablet Commonly known as: Eliquis Take 1 tablet (5 mg total) by mouth 2 (two) times daily.   cephALEXin 500 MG capsule Commonly known as: KEFLEX Take 1 capsule (500 mg total) by mouth 4 (four) times daily for 5 days.   cetirizine 10 MG tablet Commonly known as: ZYRTEC Take 10 mg by mouth daily as needed for allergies.   diclofenac Sodium 1 % Gel Commonly known as: VOLTAREN Apply 2 g topically 4 (four) times daily.   digoxin 0.125 MG tablet Commonly known as: LANOXIN Take 1 tablet (0.125 mg total) by mouth daily.   diltiazem 180 MG 24 hr capsule Commonly known as: DILACOR XR Take 180 mg by mouth daily.   docusate sodium 100 MG capsule Commonly known as: COLACE Take 1 capsule (100 mg total) by mouth 2 (two) times daily.   meclizine 25 MG tablet Commonly known as: ANTIVERT Take 25 mg by mouth 2 (two) times daily as needed (vertigo).   metFORMIN 750 MG 24 hr tablet Commonly known as: GLUCOPHAGE-XR Take 750 mg by mouth daily.   multivitamin with minerals tablet Take 1 tablet by mouth daily.   polyethylene glycol powder 17 GM/SCOOP powder Commonly known as: GLYCOLAX/MIRALAX Take 17 g by mouth 2 (two) times daily as needed for moderate constipation or severe constipation.   rOPINIRole 4 MG tablet Commonly known as: REQUIP Take 4 mg by mouth at bedtime.   rosuvastatin 20 MG tablet Commonly known as: CRESTOR Take 1 tablet (20 mg total) by  mouth daily in the afternoon. What changed: when to take this   tamsulosin 0.4 MG Caps capsule Commonly known as: Flomax Take 1 capsule (0.4 mg total) by mouth daily after supper.   VITAMIN B-12 PO Take 1 tablet by mouth daily.        Follow-up Information     [facility] Follow up.   Contact information: [place] [phone]               Allergies  Allergen Reactions   Bee Venom Shortness Of Breath and Rash   Claritin [Loratadine] Itching and Palpitations   Wasp Venom Other (See Comments) and Anaphylaxis   Apricot Flavor Swelling    Eyes swell shut   Atorvastatin Itching    Eye swelling Other reaction(s): HA    You were cared for by a hospitalist during your hospital stay. If you have any questions about your discharge medications or the care you received while you were in the hospital after you are discharged, you can call the unit and asked to speak with the hospitalist on call if the hospitalist that took  care of you is not available. Once you are discharged, your primary care physician will handle any further medical issues. Please note that no refills for any discharge medications will be authorized once you are discharged, as it is imperative that you return to your primary care physician (or establish a relationship with a primary care physician if you do not have one) for your aftercare needs so that they can reassess your need for medications and monitor your lab values.   Procedures/Studies: CT Renal Stone Study  Result Date: 11/07/2021 CLINICAL DATA:  Right flank pain, right hydronephrosis seen in the sonogram EXAM: CT ABDOMEN AND PELVIS WITHOUT CONTRAST TECHNIQUE: Multidetector CT imaging of the abdomen and pelvis was performed following the standard protocol without IV contrast. RADIATION DOSE REDUCTION: This exam was performed according to the departmental dose-optimization program which  includes automated exposure control, adjustment of the mA and/or kV according to patient size and/or use of iterative reconstruction technique. COMPARISON:  Previous CT done on 11/04/2021 and sonogram done earlier today FINDINGS: Lower chest: Small patchy infiltrate is seen in left lower lobe. Coronary artery calcifications are seen. There is prosthetic aortic valve. Dense calcification is seen in mitral annulus. Cardiac pacer leads are noted in place. Hepatobiliary: In image 18 of series 3, there is subcentimeter low-density in the right lobe of liver, possibly cysts or hemangiomas. There is no dilation of bile ducts. Gallbladder is not distended. Pancreas: No focal abnormalities are seen. Spleen: Unremarkable. Adrenals/Urinary Tract: Adrenals are unremarkable. There is mild right hydronephrosis. There is mild to moderate dilation of proximal course of right ureter. There is mild right perinephric stranding. There are 2 small calcific densities in the upper pole of right kidney. Possible tiny calcifications are seen in the lower pole of left kidney. There is no demonstrable opaque calculus in the course of the right ureter. There are multiple round calcifications in both sides of pelvis, possibly vascular calcifications. There is no hydronephrosis on the left side. Stomach/Bowel: Stomach is unremarkable. Small bowel loops are not dilated. Appendix is unremarkable. There is no significant wall thickening in colon. Few diverticula are seen in colon without signs of focal diverticulitis. Vascular/Lymphatic: Scattered arterial calcifications are seen. Reproductive: Uterus is unremarkable. There is 1.5 cm low-density in right side of pelvis, possibly ovarian follicle. Other: There is no ascites or pneumoperitoneum. Umbilical hernia containing fat is seen. Musculoskeletal: Degenerative changes are noted in lumbar spine with disc space narrowing, bony spurs and facet hypertrophy at multiple levels. There is encroachment  of neural foramina from L3-S1 levels. IMPRESSION: There is mild to moderate right hydronephrosis. There is right perinephric stranding. There is no demonstrable opaque calculus in the course of the right ureter. Findings may suggest recent passage of ureteral calculus or non opaque calculus in the distal right ureter. There are few small calcifications in both kidneys each measuring less than 3 mm suggesting renal stones. There is right perinephric stranding which may be due to ureteric obstruction or superimposed infection. There is no evidence of intestinal obstruction or pneumoperitoneum. Appendix is not dilated. Scattered diverticula are seen in colon without signs of focal diverticulitis. Lumbar spondylosis. Small patchy infiltrate in left lower lung fields may suggest atelectasis/pneumonia. Other findings as described in the body of the report. Electronically Signed   By: Elmer Picker M.D.   On: 11/07/2021 12:53   US Pelvis Complete  Result Date: 11/07/2021 CLINICAL DATA:  76 year old female with pelvic pain. EXAM: TRANSABDOMINAL ULTRASOUND OF PELVIS TECHNIQUE: Transabdominal ultrasound examination of  the pelvis was performed including evaluation of the uterus, ovaries, adnexal regions, and pelvic cul-de-sac. Patient declined transvaginal exam. COMPARISON:  11/04/2021 CT FINDINGS: Uterus Measurements: Anteverted measuring 7.8 x 1.9 x 3.5 cm = volume: 27 mL. No fibroids or other mass visualized. Endometrium Thickness: 2.3 mm.  No focal abnormality visualized. Right ovary Not visualized.  No adnexal mass. Left ovary Not visualized.  No adnexal mass. Other findings:  No abnormal free fluid. IMPRESSION: 1. Unremarkable uterus. 2. Ovaries not visualized. No free fluid or adnexal mass. Electronically Signed   By: Margarette Canada M.D.   On: 11/07/2021 11:06   US Abdomen Limited RUQ (LIVER/GB)  Result Date: 11/07/2021 CLINICAL DATA:  Right upper quadrant pain. Per technologist report, limited exam due to  body habitus. EXAM: ULTRASOUND ABDOMEN LIMITED RIGHT UPPER QUADRANT COMPARISON:  CT abdomen and pelvis November 04, 2021, abdominal ultrasound November 04, 2021 FINDINGS: Gallbladder: At least 1 shadowing gallstone is visualized. No gallbladder wall thickening or pericholecystic fluid. No sonographic Murphy sign noted by sonographer. Common bile duct: Diameter: 4 mm Liver: No focal lesion identified. Increased liver parenchymal echogenicity. Portal vein is patent on color Doppler imaging with normal direction of blood flow towards the liver. Other: Mild right hydronephrosis. IMPRESSION: 1. Cholelithiasis without evidence of acute cholecystitis. 2. Incidentally visualized mild right hydronephrosis. Electronically Signed   By: Beryle Flock M.D.   On: 11/07/2021 11:03   DG Chest 2 View  Result Date: 11/07/2021 CLINICAL DATA:  Shortness of breath EXAM: CHEST - 2 VIEW COMPARISON:  Chest x-ray October 29, 2020, chest CT Jun 22, 2020 FINDINGS: Intact left chest wall single lead pacemaker. Unchanged cardiomediastinal contours. Mild diffuse interstitial pulmonary opacities. There is also a more focal left retrocardiac opacity. No large pleural effusion or pneumothorax. No acute osseous abnormality. The visualized upper abdomen is unremarkable. IMPRESSION: 1. Mild pulmonary edema. 2. There is a more focal left retrocardiac opacity, possibly reflecting edema or atelectasis. However, aspiration/infection is also possible in the appropriate clinical context. Electronically Signed   By: Beryle Flock M.D.   On: 11/07/2021 09:29   MR ABDOMEN MRCP W WO CONTAST  Result Date: 11/05/2021 CLINICAL DATA:  Inpatient. Right upper quadrant abdominal pain. Nondiagnostic ultrasound. EXAM: MRI ABDOMEN WITHOUT AND WITH CONTRAST (INCLUDING MRCP) TECHNIQUE: Multiplanar multisequence MR imaging of the abdomen was performed both before and after the administration of intravenous contrast. Heavily T2-weighted images of the biliary and  pancreatic ducts were obtained, and three-dimensional MRCP images were rendered by post processing. CONTRAST:  23mL GADAVIST GADOBUTROL 1 MMOL/ML IV SOLN COMPARISON:  11/04/2021 CT abdomen/pelvis and right upper quadrant abdominal sonogram. FINDINGS: Lower chest: No acute abnormality at the lung bases. Hepatobiliary: Normal liver size and configuration. No definite liver surface irregularity. No hepatic steatosis. Simple subcentimeter segment 7 right liver cyst. No suspicious liver masses. Two tiny layering gallstones in the gallbladder, largest 4 mm. No gallbladder wall thickening. No pericholecystic fluid. No intrahepatic biliary ductal dilatation. Common bile duct diameter 4 mm. Small periampullary duodenal diverticulum. No choledocholithiasis. No biliary masses, strictures or beading. Pancreas: No pancreatic mass or duct dilation.  No pancreas divisum. Spleen: Normal size. No mass. Adrenals/Urinary Tract: Normal adrenals. No hydronephrosis. T1 hyperintense 1.3 cm lower right renal cortical lesion with small hematocrit level and without compelling evidence of enhancement on the motion degraded postcontrast sequences (series 18/image 65), compatible with Bosniak category 2 hemorrhagic/proteinaceous renal cyst. Small 1.0 cm posterior upper left renal cyst with thin internal septation, compatible with Bosniak category 2 renal  cyst. Additional scattered subcentimeter simple bilateral renal cysts. No suspicious renal masses. Stomach/Bowel: Normal non-distended stomach. Visualized small and large bowel is normal caliber, with no bowel wall thickening. Vascular/Lymphatic: Atherosclerotic nonaneurysmal abdominal aorta. Patent portal, splenic, hepatic and renal veins. No pathologically enlarged lymph nodes in the abdomen. Other: No abdominal ascites or focal fluid collection. Musculoskeletal: No aggressive appearing focal osseous lesions. IMPRESSION: 1. Cholelithiasis. No gallbladder wall thickening or other MRI findings  to suggest acute cholecystitis. No biliary ductal dilatation. No choledocholithiasis. 2. Small Bosniak category 1 and category 2 renal cysts. No suspicious renal masses. Electronically Signed   By: Ilona Sorrel M.D.   On: 11/05/2021 15:13   MR 3D Recon At Scanner  Result Date: 11/05/2021 CLINICAL DATA:  Inpatient. Right upper quadrant abdominal pain. Nondiagnostic ultrasound. EXAM: MRI ABDOMEN WITHOUT AND WITH CONTRAST (INCLUDING MRCP) TECHNIQUE: Multiplanar multisequence MR imaging of the abdomen was performed both before and after the administration of intravenous contrast. Heavily T2-weighted images of the biliary and pancreatic ducts were obtained, and three-dimensional MRCP images were rendered by post processing. CONTRAST:  60mL GADAVIST GADOBUTROL 1 MMOL/ML IV SOLN COMPARISON:  11/04/2021 CT abdomen/pelvis and right upper quadrant abdominal sonogram. FINDINGS: Lower chest: No acute abnormality at the lung bases. Hepatobiliary: Normal liver size and configuration. No definite liver surface irregularity. No hepatic steatosis. Simple subcentimeter segment 7 right liver cyst. No suspicious liver masses. Two tiny layering gallstones in the gallbladder, largest 4 mm. No gallbladder wall thickening. No pericholecystic fluid. No intrahepatic biliary ductal dilatation. Common bile duct diameter 4 mm. Small periampullary duodenal diverticulum. No choledocholithiasis. No biliary masses, strictures or beading. Pancreas: No pancreatic mass or duct dilation.  No pancreas divisum. Spleen: Normal size. No mass. Adrenals/Urinary Tract: Normal adrenals. No hydronephrosis. T1 hyperintense 1.3 cm lower right renal cortical lesion with small hematocrit level and without compelling evidence of enhancement on the motion degraded postcontrast sequences (series 18/image 65), compatible with Bosniak category 2 hemorrhagic/proteinaceous renal cyst. Small 1.0 cm posterior upper left renal cyst with thin internal septation, compatible  with Bosniak category 2 renal cyst. Additional scattered subcentimeter simple bilateral renal cysts. No suspicious renal masses. Stomach/Bowel: Normal non-distended stomach. Visualized small and large bowel is normal caliber, with no bowel wall thickening. Vascular/Lymphatic: Atherosclerotic nonaneurysmal abdominal aorta. Patent portal, splenic, hepatic and renal veins. No pathologically enlarged lymph nodes in the abdomen. Other: No abdominal ascites or focal fluid collection. Musculoskeletal: No aggressive appearing focal osseous lesions. IMPRESSION: 1. Cholelithiasis. No gallbladder wall thickening or other MRI findings to suggest acute cholecystitis. No biliary ductal dilatation. No choledocholithiasis. 2. Small Bosniak category 1 and category 2 renal cysts. No suspicious renal masses. Electronically Signed   By: Ilona Sorrel M.D.   On: 11/05/2021 15:13   CT ABDOMEN PELVIS W CONTRAST  Result Date: 11/04/2021 CLINICAL DATA:  acute abdominal pain in a 76 year old female. EXAM: CT ABDOMEN AND PELVIS WITH CONTRAST TECHNIQUE: Multidetector CT imaging of the abdomen and pelvis was performed using the standard protocol following bolus administration of intravenous contrast. RADIATION DOSE REDUCTION: This exam was performed according to the departmental dose-optimization program which includes automated exposure control, adjustment of the mA and/or kV according to patient size and/or use of iterative reconstruction technique. CONTRAST:  47mL OMNIPAQUE IOHEXOL 350 MG/ML SOLN COMPARISON:  Chest abdomen and pelvis study of Jun 21, 2020. 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 No pleural effusion. Mitral annular calcification and signs of cardiac pacer device incompletely  evaluated. Hepatobiliary: Lobular hepatic contours with signs of hepatic steatosis. Portal vein is patent. Hepatic veins are patent. Reflux of contrast into hepatic veins.  No focal, suspicious hepatic lesion, biliary duct dilation or pericholecystic stranding. Pancreas: Mild atrophy of the pancreas without ductal dilation, inflammation or visible lesion. Spleen: Normal. Adrenals/Urinary Tract: Adrenal glands are normal. Symmetric renal enhancement without hydronephrosis. No ureteral calculi, perivesical stranding or perinephric stranding. No suspicious renal lesion. Nephrolithiasis in the upper pole the RIGHT kidney similar to prior imaging. Stomach/Bowel: Normal appendix. Stomach without adjacent stranding. Small bowel without signs of obstruction or inflammation. Scattered colonic diverticulosis without signs of diverticulitis. Vascular/Lymphatic: Aortic atherosclerosis. No sign of aneurysm. Smooth contour of the IVC. There is no gastrohepatic or hepatoduodenal ligament lymphadenopathy. No retroperitoneal or mesenteric lymphadenopathy. No pelvic sidewall lymphadenopathy. Reproductive: Unremarkable by CT. Other: No ascites.  No free air. Musculoskeletal: No acute bone finding. No destructive bone process. Spinal degenerative changes. IMPRESSION: 1. No acute findings in the abdomen or pelvis. 2. Lobular hepatic contours with signs of hepatic steatosis. Correlate with any clinical or laboratory evidence of liver disease. 3. 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 4. Nephrolithiasis in the upper pole the RIGHT kidney similar to prior imaging. 5. Aortic atherosclerosis. Aortic Atherosclerosis (ICD10-I70.0). Electronically Signed   By: Zetta Bills M.D.   On: 11/04/2021 14:11   US Abdomen Limited RUQ (LIVER/GB)  Result Date: 11/04/2021 CLINICAL DATA:  Right upper quadrant pain EXAM: ULTRASOUND ABDOMEN LIMITED RIGHT UPPER QUADRANT COMPARISON:  CT 06/21/2020 FINDINGS: Gallbladder: Mild gallbladder wall thickening up to 3.5 mm. No gallstones or sludge is visualized. A positive Murphy sign was reported by the  sonographer. Common bile duct: Diameter: 15 mm. Liver: No focal lesion identified. Diffusely increased hepatic parenchymal echogenicity. Portal vein is patent on color Doppler imaging with normal direction of blood flow towards the liver. Other: None. Sonographer reported technically limited exam secondary to poor penetration related to patient body habitus. IMPRESSION: 1. Mild gallbladder wall thickening with a positive sonographic Murphy sign. Although no gallstones are visualized, findings are suspicious for acute cholecystitis. 2. The echogenicity of the liver is increased. This is a nonspecific finding but is most commonly seen with fatty infiltration of the liver. There are no obvious focal liver lesions. 3. Dilated common bile duct measuring up to 15 mm. Correlate with liver function tests. Electronically Signed   By: Davina Poke D.O.   On: 11/04/2021 11:50     The results of significant diagnostics from this hospitalization (including imaging, microbiology, ancillary and laboratory) are listed below for reference.     Microbiology: Recent Results (from the past 240 hour(s))  Urine Culture     Status: None   Collection Time: 11/07/21  9:36 AM   Specimen: Urine, Clean Catch  Result Value Ref Range Status   Specimen Description URINE, CLEAN CATCH  Final   Special Requests NONE  Final   Culture   Final    NO GROWTH Performed at [facility], [address] [place], [place] [address]    Report Status 11/08/2021 FINAL  Final     Labs: BNP (last 3 results) No results for input(s): "BNP" in the last 8760 hours. Basic Metabolic Panel: Recent Labs  Lab 11/04/21 1115 11/06/21 0348 11/07/21 0051 11/08/21 0008 11/09/21 0243  NA 138 140 142 139 137  K 3.5 3.8 3.4* 3.7 3.8  CL 106 105 107 107 105  CO2 21* 24 24 25  23  GLUCOSE 305* 170* 239* 134* 148*  BUN 14 14 13 14 14   CREATININE 0.88 1.08* 0.90 1.07* 0.81  CALCIUM 9.4 9.4 9.8 9.2 9.1  MG  --   --   --   --  1.6*    Liver Function Tests: Recent Labs  Lab 11/04/21 1115 11/05/21 0452 11/06/21 0348 11/07/21 0051  AST 20 18 19 27   ALT 22 20 23 27   ALKPHOS 65 60 57 64  BILITOT 0.6 1.1 0.7 0.8  PROT 6.9 6.2* 6.4* 7.4  ALBUMIN 3.8 3.4* 3.4* 4.1   Recent Labs  Lab 11/04/21 1115 11/07/21 0051  LIPASE 28 30   No results for input(s): "AMMONIA" in the last 168 hours. CBC: Recent Labs  Lab 11/04/21 1115 11/05/21 0452 11/06/21 0348 11/07/21 0051 11/08/21 0008 11/09/21 0243  WBC 11.0* 8.3 7.2 9.9 10.5 8.2  NEUTROABS 9.3*  --   --   --   --   --   HGB 14.2 12.5 12.3 13.5 12.0 11.2*  HCT 41.7 37.5 36.8 39.6 36.4 34.9*  MCV 93.1 93.5 94.6 93.8 95.3 96.7  PLT 172 156 155 181 156 154   Cardiac Enzymes: No results for input(s): "CKTOTAL", "CKMB", "CKMBINDEX", "TROPONINI" in the last 168 hours. BNP: Invalid input(s): "POCBNP" CBG: Recent Labs  Lab 11/08/21 0812 11/08/21 1149 11/08/21 1545 11/08/21 2057 11/09/21 0738  GLUCAP 180* 131* 136* 142* 125*   D-Dimer No results for input(s): "DDIMER" in the last 72 hours. Hgb A1c No results for input(s): "HGBA1C" in the last 72 hours. Lipid Profile No results for input(s): "CHOL", "HDL", "LDLCALC", "TRIG", "CHOLHDL", "LDLDIRECT" in the last 72 hours. Thyroid function studies No results for input(s): "TSH", "T4TOTAL", "T3FREE", "THYROIDAB" in the last 72 hours.  Invalid input(s): "FREET3" Anemia work up No results for input(s): "VITAMINB12", "FOLATE", "FERRITIN", "TIBC", "IRON", "RETICCTPCT" in the last 72 hours. Urinalysis    Component Value Date/Time   COLORURINE YELLOW 11/07/2021 0044   APPEARANCEUR HAZY (A) 11/07/2021 0044   LABSPEC 1.021 11/07/2021 0044   PHURINE 5.0 11/07/2021 0044   GLUCOSEU >=500 (A) 11/07/2021 0044   HGBUR LARGE (A) 11/07/2021 0044   BILIRUBINUR NEGATIVE 11/07/2021 0044   KETONESUR 20 (A) 11/07/2021 0044   PROTEINUR 100 (A) 11/07/2021 0044   NITRITE NEGATIVE 11/07/2021 0044   LEUKOCYTESUR NEGATIVE  11/07/2021 0044   Sepsis Labs Recent Labs  Lab 11/06/21 0348 11/07/21 0051 11/08/21 0008 11/09/21 0243  WBC 7.2 9.9 10.5 8.2   Microbiology Recent Results (from the past 240 hour(s))  Urine Culture     Status: None   Collection Time: 11/07/21  9:36 AM   Specimen: Urine, Clean Catch  Result Value Ref Range Status   Specimen Description URINE, CLEAN CATCH  Final   Special Requests NONE  Final   Culture   Final    NO GROWTH Performed at [facility], [place] [address], [place], [place] [address]    Report Status 11/08/2021 FINAL  Final     Time coordinating discharge:  I have spent 35 minutes face to face with the patient and on the ward discussing the patients care, assessment, plan and disposition with other care givers. >50% of the time was devoted counseling the patient about the risks and benefits of treatment/Discharge disposition and coordinating care.   SIGNED:   Damita Lack, MD  [facility] 11/09/2021, 10:54 AM   If 7PM-7AM, please contact night-coverage

## 2021-11-09 NOTE — Progress Notes (Signed)
Patient discharge home with family. Patient verbalized understanding of discharge instructions and medications received from [facility]. IV discontinued prior to discharge.

## 2021-11-09 NOTE — Progress Notes (Signed)
Mobility Specialist: Progress Note   11/09/21 1125  Mobility  Activity Ambulated with assistance in hallway  Level of Assistance Minimal assist, patient does 75% or more  Assistive Device Front wheel walker  Distance Ambulated (ft) 92 ft  Activity Response Tolerated fair  $Mobility charge 1 Mobility   During Mobility: 161/79 BP Post-Mobility: 160/71 BP  Pt received in the bed and agreeable to mobility. C/o mild dizziness which progressed worse after standing. Stopped for seated break, BP as seen above. Pt agreeable to second attempt after seated break with c/o mild dizziness during ambulation but did not progress worse. Pt sitting EOB after session with call bell and phone in reach.   Kellogg Tarrin Menn Mobility Specialist Secure Chat Only

## 2021-11-09 NOTE — TOC Transition Note (Signed)
Four (4) discharge meds in the [place] for patient until discharge.

## 2021-11-12 DIAGNOSIS — N12 Tubulo-interstitial nephritis, not specified as acute or chronic: Secondary | ICD-10-CM | POA: Diagnosis not present

## 2021-11-12 DIAGNOSIS — J449 Chronic obstructive pulmonary disease, unspecified: Secondary | ICD-10-CM | POA: Diagnosis not present

## 2021-11-12 DIAGNOSIS — N200 Calculus of kidney: Secondary | ICD-10-CM | POA: Diagnosis not present

## 2021-11-12 DIAGNOSIS — I700 Atherosclerosis of aorta: Secondary | ICD-10-CM | POA: Diagnosis not present

## 2021-11-22 ENCOUNTER — Ambulatory Visit (INDEPENDENT_AMBULATORY_CARE_PROVIDER_SITE_OTHER): Payer: Medicare Other

## 2021-11-22 ENCOUNTER — Telehealth: Payer: Self-pay | Admitting: Cardiology

## 2021-11-22 DIAGNOSIS — Z950 Presence of cardiac pacemaker: Secondary | ICD-10-CM | POA: Diagnosis not present

## 2021-11-22 DIAGNOSIS — I4821 Permanent atrial fibrillation: Secondary | ICD-10-CM

## 2021-11-22 NOTE — Telephone Encounter (Signed)
Pt is requesting call back to get advice on where to get a medical id bracelet. Please advise.

## 2021-11-23 NOTE — Telephone Encounter (Signed)
Returned call to patient. She states she is trying to figure out how to get a medical ID bracelet before going on a cruise.  Informed patient I will forward her call to our social worker to see if we can get some guidance/assistance on this matter.

## 2021-11-23 NOTE — Telephone Encounter (Signed)
Shared information from social worker Westley Hummer regarding medical ID bracelet.  Per Michiel Cowboy: I would recommend she call her insurance and see if that is something that they may cover/reimburse her if she purchased one!  She could also call the Senior Line at [employer] to ask- their number is [phone]. I just highly doubt anyone is going to cover the cost for her.  Family and friends who I know have them have purchased them online or at jewelry stores with the medical conditions that they feel important to be listed.    Patient verbalized understanding and expressed appreciation for assistance.

## 2021-11-24 LAB — CUP PACEART REMOTE DEVICE CHECK
Battery Remaining Longevity: 135 mo
Battery Remaining Percentage: 95 %
Battery Voltage: 3.04 V
Brady Statistic RV Percent Paced: 98 %
Date Time Interrogation Session: 20231023025753
Implantable Lead Connection Status: 753985
Implantable Lead Implant Date: 20220928
Implantable Lead Location: 753860
Implantable Pulse Generator Implant Date: 20220928
Lead Channel Impedance Value: 460 Ohm
Lead Channel Pacing Threshold Amplitude: 0.5 V
Lead Channel Pacing Threshold Pulse Width: 0.4 ms
Lead Channel Sensing Intrinsic Amplitude: 3.7 mV
Lead Channel Setting Pacing Amplitude: 0.75 V
Lead Channel Setting Pacing Pulse Width: 0.4 ms
Lead Channel Setting Sensing Sensitivity: 2 mV
Pulse Gen Model: 1272
Pulse Gen Serial Number: 3937998

## 2021-12-27 DIAGNOSIS — J45909 Unspecified asthma, uncomplicated: Secondary | ICD-10-CM | POA: Diagnosis not present

## 2021-12-27 DIAGNOSIS — J069 Acute upper respiratory infection, unspecified: Secondary | ICD-10-CM | POA: Diagnosis not present

## 2021-12-27 DIAGNOSIS — Z03818 Encounter for observation for suspected exposure to other biological agents ruled out: Secondary | ICD-10-CM | POA: Diagnosis not present

## 2021-12-27 NOTE — Progress Notes (Signed)
Remote pacemaker transmission.   

## 2022-01-03 ENCOUNTER — Telehealth: Payer: Self-pay | Admitting: Cardiology

## 2022-01-03 DIAGNOSIS — I10 Essential (primary) hypertension: Secondary | ICD-10-CM | POA: Diagnosis not present

## 2022-01-03 DIAGNOSIS — Z23 Encounter for immunization: Secondary | ICD-10-CM | POA: Diagnosis not present

## 2022-01-03 DIAGNOSIS — R269 Unspecified abnormalities of gait and mobility: Secondary | ICD-10-CM | POA: Diagnosis not present

## 2022-01-03 DIAGNOSIS — H8110 Benign paroxysmal vertigo, unspecified ear: Secondary | ICD-10-CM | POA: Diagnosis not present

## 2022-01-03 NOTE — Telephone Encounter (Signed)
Pt reports being "off balance, head feels swimmy". She feels like she is going to lose her balance. She has not taken her PRN Meclizine as ordered. Pt advised she should follow up with her PCP about her symptoms for further evaluation. Informed that she can send in a PPM transmission, but that I am not expecting it to show [place] anything from an electrical aspect.  Pt does not want to at this time. She will follow up with her PCP. Advised to call back if need to discuss further. Patient verbalized understanding and agreeable to plan.

## 2022-01-03 NOTE — Telephone Encounter (Signed)
Patient states her PPM is throwing her balance off and she has already fallen twice today due to losing her balance. She states she has also been experiencing some itching.

## 2022-01-26 ENCOUNTER — Emergency Department (HOSPITAL_COMMUNITY): Payer: Medicare Other

## 2022-01-26 ENCOUNTER — Encounter (HOSPITAL_COMMUNITY): Payer: Self-pay | Admitting: Emergency Medicine

## 2022-01-26 ENCOUNTER — Inpatient Hospital Stay (HOSPITAL_COMMUNITY)
Admission: EM | Admit: 2022-01-26 | Discharge: 2022-01-28 | DRG: 178 | Disposition: A | Discharge status: Home Health | Payer: Medicare Other | Attending: Internal Medicine | Admitting: Internal Medicine

## 2022-01-26 ENCOUNTER — Other Ambulatory Visit: Payer: Self-pay

## 2022-01-26 DIAGNOSIS — I5032 Chronic diastolic (congestive) heart failure: Secondary | ICD-10-CM | POA: Diagnosis not present

## 2022-01-26 DIAGNOSIS — Z888 Allergy status to other drugs, medicaments and biological substances status: Secondary | ICD-10-CM | POA: Diagnosis not present

## 2022-01-26 DIAGNOSIS — Z91030 Bee allergy status: Secondary | ICD-10-CM | POA: Diagnosis not present

## 2022-01-26 DIAGNOSIS — M069 Rheumatoid arthritis, unspecified: Secondary | ICD-10-CM | POA: Diagnosis not present

## 2022-01-26 DIAGNOSIS — Z823 Family history of stroke: Secondary | ICD-10-CM | POA: Diagnosis not present

## 2022-01-26 DIAGNOSIS — G2581 Restless legs syndrome: Secondary | ICD-10-CM | POA: Diagnosis present

## 2022-01-26 DIAGNOSIS — R062 Wheezing: Secondary | ICD-10-CM | POA: Diagnosis not present

## 2022-01-26 DIAGNOSIS — Z79899 Other long term (current) drug therapy: Secondary | ICD-10-CM

## 2022-01-26 DIAGNOSIS — Z8249 Family history of ischemic heart disease and other diseases of the circulatory system: Secondary | ICD-10-CM | POA: Diagnosis not present

## 2022-01-26 DIAGNOSIS — Z950 Presence of cardiac pacemaker: Secondary | ICD-10-CM

## 2022-01-26 DIAGNOSIS — Z9102 Food additives allergy status: Secondary | ICD-10-CM

## 2022-01-26 DIAGNOSIS — Z743 Need for continuous supervision: Secondary | ICD-10-CM | POA: Diagnosis not present

## 2022-01-26 DIAGNOSIS — I110 Hypertensive heart disease with heart failure: Secondary | ICD-10-CM | POA: Diagnosis present

## 2022-01-26 DIAGNOSIS — Z809 Family history of malignant neoplasm, unspecified: Secondary | ICD-10-CM

## 2022-01-26 DIAGNOSIS — I4819 Other persistent atrial fibrillation: Secondary | ICD-10-CM | POA: Diagnosis not present

## 2022-01-26 DIAGNOSIS — Z7901 Long term (current) use of anticoagulants: Secondary | ICD-10-CM

## 2022-01-26 DIAGNOSIS — E1165 Type 2 diabetes mellitus with hyperglycemia: Secondary | ICD-10-CM

## 2022-01-26 DIAGNOSIS — E785 Hyperlipidemia, unspecified: Secondary | ICD-10-CM | POA: Diagnosis present

## 2022-01-26 DIAGNOSIS — R739 Hyperglycemia, unspecified: Secondary | ICD-10-CM

## 2022-01-26 DIAGNOSIS — R0902 Hypoxemia: Secondary | ICD-10-CM | POA: Diagnosis present

## 2022-01-26 DIAGNOSIS — E119 Type 2 diabetes mellitus without complications: Secondary | ICD-10-CM | POA: Diagnosis present

## 2022-01-26 DIAGNOSIS — R531 Weakness: Secondary | ICD-10-CM | POA: Diagnosis not present

## 2022-01-26 DIAGNOSIS — Z7984 Long term (current) use of oral hypoglycemic drugs: Secondary | ICD-10-CM | POA: Diagnosis not present

## 2022-01-26 DIAGNOSIS — K219 Gastro-esophageal reflux disease without esophagitis: Secondary | ICD-10-CM | POA: Diagnosis present

## 2022-01-26 DIAGNOSIS — R6889 Other general symptoms and signs: Secondary | ICD-10-CM | POA: Diagnosis not present

## 2022-01-26 DIAGNOSIS — Z952 Presence of prosthetic heart valve: Secondary | ICD-10-CM | POA: Diagnosis not present

## 2022-01-26 DIAGNOSIS — R0602 Shortness of breath: Secondary | ICD-10-CM | POA: Diagnosis not present

## 2022-01-26 DIAGNOSIS — I2510 Atherosclerotic heart disease of native coronary artery without angina pectoris: Secondary | ICD-10-CM | POA: Diagnosis present

## 2022-01-26 DIAGNOSIS — I10 Essential (primary) hypertension: Secondary | ICD-10-CM | POA: Diagnosis present

## 2022-01-26 DIAGNOSIS — S0990XA Unspecified injury of head, initial encounter: Secondary | ICD-10-CM | POA: Diagnosis not present

## 2022-01-26 DIAGNOSIS — J45909 Unspecified asthma, uncomplicated: Secondary | ICD-10-CM | POA: Diagnosis present

## 2022-01-26 DIAGNOSIS — G319 Degenerative disease of nervous system, unspecified: Secondary | ICD-10-CM | POA: Diagnosis not present

## 2022-01-26 DIAGNOSIS — U071 COVID-19: Secondary | ICD-10-CM | POA: Diagnosis not present

## 2022-01-26 DIAGNOSIS — J811 Chronic pulmonary edema: Secondary | ICD-10-CM | POA: Diagnosis not present

## 2022-01-26 LAB — CBC
HCT: 36.6 % (ref 36.0–46.0)
Hemoglobin: 12.1 g/dL (ref 12.0–15.0)
MCH: 30.9 pg (ref 26.0–34.0)
MCHC: 33.1 g/dL (ref 30.0–36.0)
MCV: 93.4 fL (ref 80.0–100.0)
Platelets: 178 10*3/uL (ref 150–400)
RBC: 3.92 MIL/uL (ref 3.87–5.11)
RDW: 13.4 % (ref 11.5–15.5)
WBC: 10.2 10*3/uL (ref 4.0–10.5)
nRBC: 0 % (ref 0.0–0.2)

## 2022-01-26 LAB — BASIC METABOLIC PANEL
Anion gap: 10 (ref 5–15)
BUN: 10 mg/dL (ref 8–23)
CO2: 21 mmol/L — ABNORMAL LOW (ref 22–32)
Calcium: 9 mg/dL (ref 8.9–10.3)
Chloride: 106 mmol/L (ref 98–111)
Creatinine, Ser: 0.96 mg/dL (ref 0.44–1.00)
GFR, Estimated: >60 mL/min (ref >60)
Glucose, Bld: 239 mg/dL — ABNORMAL HIGH (ref 70–99)
Potassium: 3.7 mmol/L (ref 3.5–5.1)
Sodium: 137 mmol/L (ref 135–145)

## 2022-01-26 LAB — HEPATIC FUNCTION PANEL
ALT: 30 U/L (ref 0–44)
AST: 31 U/L (ref 15–41)
Albumin: 3.8 g/dL (ref 3.5–5.0)
Alkaline Phosphatase: 65 U/L (ref 38–126)
Bilirubin, Direct: 0.1 mg/dL (ref 0.0–0.2)
Indirect Bilirubin: 0.8 mg/dL (ref 0.3–0.9)
Total Bilirubin: 0.9 mg/dL (ref 0.3–1.2)
Total Protein: 6.8 g/dL (ref 6.5–8.1)

## 2022-01-26 LAB — URINALYSIS, ROUTINE W REFLEX MICROSCOPIC
Bacteria, UA: NONE SEEN
Bilirubin Urine: NEGATIVE
Glucose, UA: 50 mg/dL — AB
Hgb urine dipstick: NEGATIVE
Ketones, ur: NEGATIVE mg/dL
Leukocytes,Ua: NEGATIVE
Nitrite: NEGATIVE
Protein, ur: 30 mg/dL — AB
Specific Gravity, Urine: 1.023 (ref 1.005–1.030)
pH: 5 (ref 5.0–8.0)

## 2022-01-26 LAB — LACTIC ACID, PLASMA
Lactic Acid, Venous: 3.1 mmol/L (ref 0.5–1.9)
Lactic Acid, Venous: 2.4 mmol/L (ref 0.5–1.9)
Lactic Acid, Venous: 1.4 mmol/L (ref 0.5–1.9)

## 2022-01-26 LAB — RESP PANEL BY RT-PCR (RSV, FLU A&B, COVID)  RVPGX2
Influenza A by PCR: NEGATIVE
Influenza B by PCR: NEGATIVE
Resp Syncytial Virus by PCR: NEGATIVE
SARS Coronavirus 2 by RT PCR: POSITIVE — AB

## 2022-01-26 LAB — GLUCOSE, CAPILLARY: Glucose-Capillary: 293 mg/dL — ABNORMAL HIGH (ref 70–99)

## 2022-01-26 LAB — PROCALCITONIN: Procalcitonin: <0.1 ng/mL

## 2022-01-26 LAB — C-REACTIVE PROTEIN: CRP: 1.3 mg/dL — ABNORMAL HIGH (ref <1.0)

## 2022-01-26 LAB — LACTATE DEHYDROGENASE: LDH: 246 U/L — ABNORMAL HIGH (ref 98–192)

## 2022-01-26 LAB — PROTIME-INR
INR: 1.2 (ref 0.8–1.2)
Prothrombin Time: 14.9 seconds (ref 11.4–15.2)

## 2022-01-26 LAB — APTT: aPTT: 31 seconds (ref 24–36)

## 2022-01-26 LAB — BRAIN NATRIURETIC PEPTIDE: B Natriuretic Peptide: 585.9 pg/mL — ABNORMAL HIGH (ref 0.0–100.0)

## 2022-01-26 LAB — FIBRINOGEN: Fibrinogen: 369 mg/dL (ref 210–475)

## 2022-01-26 LAB — FERRITIN: Ferritin: 111 ng/mL (ref 11–307)

## 2022-01-26 LAB — D-DIMER, QUANTITATIVE: D-Dimer, Quant: 1.12 ug/mL-FEU — ABNORMAL HIGH (ref 0.00–0.50)

## 2022-01-26 MED ORDER — ACETAMINOPHEN 325 MG PO TABS
650.0000 mg | ORAL_TABLET | Freq: Four times a day (QID) | ORAL | Status: DC | PRN
  Administered 2022-01-26: 650 mg via ORAL
  Filled 2022-01-26: qty 2

## 2022-01-26 MED ORDER — SODIUM CHLORIDE 0.9% FLUSH
3.0000 mL | INTRAVENOUS | Status: DC | PRN
  Administered 2022-01-26: 3 mL via INTRAVENOUS

## 2022-01-26 MED ORDER — APIXABAN 5 MG PO TABS
5.0000 mg | ORAL_TABLET | Freq: Two times a day (BID) | ORAL | Status: DC
  Administered 2022-01-26 – 2022-01-28 (×5): 5 mg via ORAL
  Filled 2022-01-26 (×5): qty 1

## 2022-01-26 MED ORDER — POLYETHYLENE GLYCOL 3350 17 G PO PACK: 17.0000 g | PACK | Freq: Every day | ORAL | Status: DC | PRN

## 2022-01-26 MED ORDER — ONDANSETRON HCL 4 MG PO TABS: 4.0000 mg | ORAL_TABLET | Freq: Four times a day (QID) | ORAL | Status: DC | PRN

## 2022-01-26 MED ORDER — OXYCODONE HCL 5 MG PO TABS
5.0000 mg | ORAL_TABLET | ORAL | Status: DC | PRN
  Administered 2022-01-26: 5 mg via ORAL
  Filled 2022-01-26: qty 1

## 2022-01-26 MED ORDER — DIGOXIN 125 MCG PO TABS
0.1250 mg | ORAL_TABLET | Freq: Every day | ORAL | Status: DC
  Administered 2022-01-26 – 2022-01-28 (×3): 0.125 mg via ORAL
  Filled 2022-01-26 (×3): qty 1

## 2022-01-26 MED ORDER — SODIUM CHLORIDE 0.9% FLUSH
3.0000 mL | Freq: Two times a day (BID) | INTRAVENOUS | Status: DC
  Administered 2022-01-26 – 2022-01-28 (×5): 3 mL via INTRAVENOUS

## 2022-01-26 MED ORDER — METHYLPREDNISOLONE SODIUM SUCC 125 MG IJ SOLR
0.5000 mg/kg | Freq: Two times a day (BID) | INTRAMUSCULAR | Status: DC
  Administered 2022-01-26 – 2022-01-27 (×2): 40.625 mg via INTRAVENOUS
  Filled 2022-01-26 (×3): qty 2

## 2022-01-26 MED ORDER — INSULIN ASPART 100 UNIT/ML IJ SOLN
0.0000 [IU] | Freq: Every day | INTRAMUSCULAR | Status: DC
  Administered 2022-01-26: 3 [IU] via SUBCUTANEOUS
  Administered 2022-01-27: 2 [IU] via SUBCUTANEOUS

## 2022-01-26 MED ORDER — VITAMIN C 500 MG PO TABS
500.0000 mg | ORAL_TABLET | Freq: Every day | ORAL | Status: DC
  Administered 2022-01-26 – 2022-01-28 (×3): 500 mg via ORAL
  Filled 2022-01-26 (×2): qty 1

## 2022-01-26 MED ORDER — ZINC SULFATE 220 (50 ZN) MG PO CAPS
220.0000 mg | ORAL_CAPSULE | Freq: Every day | ORAL | Status: DC
  Administered 2022-01-26 – 2022-01-28 (×3): 220 mg via ORAL
  Filled 2022-01-26 (×3): qty 1

## 2022-01-26 MED ORDER — ACETAMINOPHEN 325 MG PO TABS
650.0000 mg | ORAL_TABLET | Freq: Once | ORAL | Status: AC
  Administered 2022-01-26: 650 mg via ORAL
  Filled 2022-01-26: qty 2

## 2022-01-26 MED ORDER — DOCUSATE SODIUM 100 MG PO CAPS
100.0000 mg | ORAL_CAPSULE | Freq: Two times a day (BID) | ORAL | Status: DC
  Administered 2022-01-26 – 2022-01-27 (×4): 100 mg via ORAL
  Filled 2022-01-26 (×5): qty 1

## 2022-01-26 MED ORDER — ALBUTEROL SULFATE HFA 108 (90 BASE) MCG/ACT IN AERS: 2.0000 | INHALATION_SPRAY | RESPIRATORY_TRACT | Status: DC | PRN

## 2022-01-26 MED ORDER — HYDRALAZINE HCL 20 MG/ML IJ SOLN: 5.0000 mg | INTRAMUSCULAR | Status: DC | PRN

## 2022-01-26 MED ORDER — BISACODYL 5 MG PO TBEC: 5.0000 mg | DELAYED_RELEASE_TABLET | Freq: Every day | ORAL | Status: DC | PRN

## 2022-01-26 MED ORDER — ONDANSETRON HCL 4 MG/2ML IJ SOLN: 4.0000 mg | Freq: Four times a day (QID) | INTRAMUSCULAR | Status: DC | PRN

## 2022-01-26 MED ORDER — SODIUM CHLORIDE 0.9 % IV SOLN: INTRAVENOUS | Status: DC | PRN

## 2022-01-26 MED ORDER — PREDNISONE 50 MG PO TABS: 50.0000 mg | ORAL_TABLET | Freq: Every day | ORAL | Status: DC

## 2022-01-26 MED ORDER — SODIUM CHLORIDE 0.9 % IV SOLN
100.0000 mg | Freq: Every day | INTRAVENOUS | Status: AC
  Administered 2022-01-27 – 2022-01-28 (×2): 100 mg via INTRAVENOUS
  Filled 2022-01-26 (×2): qty 20

## 2022-01-26 MED ORDER — SODIUM CHLORIDE 0.9 % IV SOLN
200.0000 mg | Freq: Once | INTRAVENOUS | Status: AC
  Administered 2022-01-26: 200 mg via INTRAVENOUS
  Filled 2022-01-26 (×4): qty 40

## 2022-01-26 MED ORDER — SODIUM CHLORIDE 0.9 % IV BOLUS
250.0000 mL | Freq: Once | INTRAVENOUS | Status: AC
  Administered 2022-01-26: 250 mL via INTRAVENOUS

## 2022-01-26 MED ORDER — HYDROCOD POLI-CHLORPHE POLI ER 10-8 MG/5ML PO SUER: 5.0000 mL | Freq: Two times a day (BID) | ORAL | Status: DC | PRN

## 2022-01-26 MED ORDER — ROPINIROLE HCL 0.5 MG PO TABS
4.0000 mg | ORAL_TABLET | Freq: Every day | ORAL | Status: DC
  Administered 2022-01-26: 4 mg via ORAL
  Filled 2022-01-26: qty 8

## 2022-01-26 MED ORDER — DILTIAZEM HCL ER COATED BEADS 180 MG PO CP24
180.0000 mg | ORAL_CAPSULE | Freq: Every day | ORAL | Status: DC
  Administered 2022-01-26 – 2022-01-28 (×3): 180 mg via ORAL
  Filled 2022-01-26 (×3): qty 1

## 2022-01-26 MED ORDER — FLEET ENEMA 7-19 GM/118ML RE ENEM: 1.0000 | ENEMA | Freq: Once | RECTAL | Status: DC | PRN

## 2022-01-26 MED ORDER — GUAIFENESIN-DM 100-10 MG/5ML PO SYRP
10.0000 mL | ORAL_SOLUTION | ORAL | Status: DC | PRN
  Administered 2022-01-26 – 2022-01-27 (×2): 10 mL via ORAL
  Filled 2022-01-26 (×2): qty 10

## 2022-01-26 MED ORDER — NIRMATRELVIR/RITONAVIR (PAXLOVID)TABLET: 3.0000 | ORAL_TABLET | Freq: Two times a day (BID) | ORAL | Status: DC

## 2022-01-26 MED ORDER — INSULIN ASPART 100 UNIT/ML IJ SOLN
0.0000 [IU] | Freq: Three times a day (TID) | INTRAMUSCULAR | Status: DC
  Administered 2022-01-26: 1 [IU] via SUBCUTANEOUS
  Administered 2022-01-27: 3 [IU] via SUBCUTANEOUS
  Administered 2022-01-27 (×2): 8 [IU] via SUBCUTANEOUS
  Administered 2022-01-28: 11 [IU] via SUBCUTANEOUS
  Administered 2022-01-28: 3 [IU] via SUBCUTANEOUS

## 2022-01-26 NOTE — ED Provider Notes (Signed)
[place] EMERGENCY DEPARTMENT Provider Note   CSN: [id number] Arrival date & time: 01/26/22  0542     History  Chief Complaint  Patient presents with   Shortness of Breath    Kathleene B Shenberger is a 76 y.o. female.  76 year old female brought in by EMS from home, daughter at bedside.  Patient states that she went to get out of bed this morning, slid to the floor feeling poorly.  She was unable to get back up, family was unable to get her up and called fire.  [place] noticed that patient was having difficulty breathing and called EMS.  EMS reported patient to be febrile, O2 low, provided with nebs and supplemental O2.  Not on supplemental O2 at baseline.  Family reports patient lives with her daughter who has [place].  Patient is on Eliquis, denies hitting head, no LOC.  Past medical history of hyperlipidemia, GERD, depression, asthma, RA, hypertension, diabetes, A-fib, severe aortic stenosis, status post TAVR, CAD       Home Medications Prior to Admission medications   Medication Sig Start Date End Date Taking? Authorizing Provider  acetaminophen (TYLENOL) 500 MG tablet Take 500-1,000 mg by mouth every 8 (eight) hours as needed for moderate pain.   Yes [provider]  albuterol (VENTOLIN HFA) 108 (90 Base) MCG/ACT inhaler Inhale 2 puffs into the lungs every 4 (four) hours as needed for wheezing or shortness of breath.   Yes [provider]  amLODipine (NORVASC) 10 MG tablet Take 1 tablet (10 mg total) by mouth daily. 11/03/21  Yes [name], NP  apixaban (ELIQUIS) 5 MG TABS tablet Take 1 tablet (5 mg total) by mouth 2 (two) times daily. 06/18/21  Yes Belva Crome, MD  benzonatate (TESSALON) 200 MG capsule Take 200 mg by mouth 3 (three) times daily as needed for cough. 11/12/21  Yes [provider]  cetirizine (ZYRTEC) 10 MG tablet Take 10 mg by mouth daily as needed for allergies.   Yes [provider]   Cyanocobalamin (VITAMIN B-12 PO) Take 1 tablet by mouth daily.   Yes [provider]  diclofenac Sodium (VOLTAREN) 1 % GEL Apply 2 g topically 4 (four) times daily. 11/09/21  Yes Amin, [name], MD  digoxin (LANOXIN) 0.125 MG tablet Take 1 tablet (0.125 mg total) by mouth daily. 06/11/21  Yes [name], PA-C  diltiazem (DILACOR XR) 180 MG 24 hr capsule Take 180 mg by mouth daily.   Yes [provider]  docusate sodium (COLACE) 100 MG capsule Take 1 capsule (100 mg total) by mouth 2 (two) times daily. Patient taking differently: Take 100 mg by mouth daily as needed for mild constipation. 11/09/21  Yes Amin, [name], MD  meclizine (ANTIVERT) 25 MG tablet Take 25 mg by mouth 2 (two) times daily as needed (vertigo). 11/12/20  Yes [provider]  Multiple Vitamins-Minerals (MULTIVITAMIN WITH MINERALS) tablet Take 1 tablet by mouth daily.   Yes [provider]  rOPINIRole (REQUIP) 4 MG tablet Take 4 mg by mouth at bedtime.   Yes [provider]  rosuvastatin (CRESTOR) 20 MG tablet Take 1 tablet (20 mg total) by mouth daily in the afternoon. Patient not taking: Reported on 01/26/2022 09/15/20   Belva Crome, MD      Allergies    Bee venom, Claritin [loratadine], Wasp venom, Apricot flavor, and Atorvastatin    Review of Systems   Review of Systems Negative except  as per HPI Physical Exam Updated Vital Signs BP (!) 166/75   Pulse 61   Temp 98.1 F (36.7 C)   Resp 19   Ht 5\' 8"  (1.727 m)   Wt 81 kg   SpO2 98%   BMI 27.15 kg/m  Physical Exam Vitals and nursing note reviewed.  Constitutional:      General: She is not in acute distress.    Appearance: She is well-developed. She is not diaphoretic.  HENT:     Head: Normocephalic and atraumatic.  Cardiovascular:     Rate and Rhythm: Normal rate and regular rhythm.     Heart sounds: Murmur heard.  Pulmonary:     Effort: Tachypnea present.     Breath sounds: Examination of  the right-lower field reveals decreased breath sounds. Examination of the left-lower field reveals decreased breath sounds. Decreased breath sounds present. No wheezing.  Abdominal:     Palpations: Abdomen is soft.     Tenderness: There is no abdominal tenderness.  Musculoskeletal:     Cervical back: Neck supple.     Right lower leg: No tenderness. No edema.     Left lower leg: No tenderness. No edema.  Lymphadenopathy:     Cervical: No cervical adenopathy.  Skin:    General: Skin is warm and dry.  Neurological:     Mental Status: She is alert and oriented to person, place, and time.     Motor: Weakness present.     Comments: Generally weak  Psychiatric:        Behavior: Behavior normal.     ED Results / Procedures / Treatments   Labs (all labs ordered are listed, but only abnormal results are displayed) Labs Reviewed  RESP PANEL BY RT-PCR (RSV, FLU A&B, COVID)  RVPGX2 - Abnormal; Notable for the following components:      Result Value   SARS Coronavirus 2 by RT PCR POSITIVE (*)    All other components within normal limits  BASIC METABOLIC PANEL - Abnormal; Notable for the following components:   CO2 21 (*)    Glucose, Bld 239 (*)    All other components within normal limits  LACTIC ACID, PLASMA - Abnormal; Notable for the following components:   Lactic Acid, Venous 3.1 (*)    All other components within normal limits  LACTIC ACID, PLASMA - Abnormal; Notable for the following components:   Lactic Acid, Venous 2.4 (*)    All other components within normal limits  URINALYSIS, ROUTINE W REFLEX MICROSCOPIC - Abnormal; Notable for the following components:   Glucose, UA 50 (*)    Protein, ur 30 (*)    Crystals HIPPURIC ACID CRYSTALS PRESENT (*)    All other components within normal limits  BRAIN NATRIURETIC PEPTIDE - Abnormal; Notable for the following components:   B Natriuretic Peptide 585.9 (*)    All other components within normal limits  CULTURE, BLOOD (ROUTINE X 2)   CULTURE, BLOOD (ROUTINE X 2)  URINE CULTURE  CBC  PROTIME-INR  APTT  HEPATIC FUNCTION PANEL  D-DIMER, QUANTITATIVE  C-REACTIVE PROTEIN  FERRITIN  FIBRINOGEN  LACTATE DEHYDROGENASE  PROCALCITONIN    EKG EKG Interpretation  Date/Time:  Wednesday January 26 2022 06:24:32 EST Ventricular Rate:  60 PR Interval:    QRS Duration: 146 QT Interval:  580 QTC Calculation: 580 R Axis:   -70 Text Interpretation: Ventricular-paced rhythm with intrinsic complexes Abnormal ECG When compared with ECG of 07-Nov-2021 08:59, PREVIOUS ECG IS PRESENT Confirmed by Nanda Quinton (  [id number]) on 01/26/2022 6:30:21 AM  Radiology CT Head Wo Contrast  Result Date: 01/26/2022 CLINICAL DATA:  Head trauma, minor (Age >= 65y) EXAM: CT HEAD WITHOUT CONTRAST TECHNIQUE: Contiguous axial images were obtained from the base of the skull through the vertex without intravenous contrast. RADIATION DOSE REDUCTION: This exam was performed according to the departmental dose-optimization program which includes automated exposure control, adjustment of the mA and/or kV according to patient size and/or use of iterative reconstruction technique. COMPARISON:  CT head 08/31/2021. FINDINGS: Brain: No evidence of acute infarction, hemorrhage, hydrocephalus, extra-axial collection or mass lesion/mass effect. Cerebral atrophy. Vascular: No hyperdense vessel identified. Skull: No acute fracture. Sinuses/Orbits: Clear sinuses. Other: No mastoid effusions. IMPRESSION: No evidence of acute intracranial abnormality. Electronically Signed   By: Feliberto Harts M.D.   On: 01/26/2022 08:42   DG Chest 2 View  Result Date: 01/26/2022 CLINICAL DATA:  76 year old female with history of shortness of breath, weakness and cough. EXAM: CHEST - 2 VIEW COMPARISON:  Chest x-ray 11/17/2021. FINDINGS: Lung volumes are slightly low. No consolidative airspace disease. No pleural effusions. No pneumothorax. Cephalization of the pulmonary vasculature, without  frank pulmonary edema. Heart size is mildly enlarged. Upper mediastinal contours are within normal limits. Atherosclerotic calcifications are noted in the thoracic aorta. Left-sided pacemaker device in place with lead tip projecting over the expected location of the right ventricle. Status post TAVR. Severe calcifications of the mitral annulus again noted. IMPRESSION: 1. Cardiomegaly with pulmonary venous congestion, but no frank pulmonary edema. 2. Aortic atherosclerosis. Electronically Signed   By: Trudie Reed M.D.   On: 01/26/2022 06:47    Procedures .Critical Care  Performed by: Jeannie Fend, PA-C Authorized by: Jeannie Fend, PA-C   Critical care provider statement:    Critical care time (minutes):  30   Critical care was time spent personally by me on the following activities:  Development of treatment plan with patient or surrogate, discussions with consultants, evaluation of patient's response to treatment, examination of patient, ordering and review of laboratory studies, ordering and review of radiographic studies, ordering and performing treatments and interventions, pulse oximetry, re-evaluation of patient's condition and review of old charts     Medications Ordered in ED Medications  digoxin (LANOXIN) tablet 0.125 mg (0.125 mg Oral Given 01/26/22 1437)  apixaban (ELIQUIS) tablet 5 mg (5 mg Oral Given 01/26/22 1437)  rOPINIRole (REQUIP) tablet 4 mg (has no administration in time range)  sodium chloride flush (NS) 0.9 % injection 3 mL (3 mLs Intravenous Given 01/26/22 1438)  sodium chloride flush (NS) 0.9 % injection 3 mL (3 mLs Intravenous Given 01/26/22 1438)  sodium chloride flush (NS) 0.9 % injection 3 mL (3 mLs Intravenous Given 01/26/22 1437)  0.9 %  sodium chloride infusion (has no administration in time range)  albuterol (VENTOLIN HFA) 108 (90 Base) MCG/ACT inhaler 2 puff (has no administration in time range)  methylPREDNISolone sodium succinate (SOLU-MEDROL) 125  mg/2 mL injection 40.625 mg (40.625 mg Intravenous Given 01/26/22 1439)    Followed by  predniSONE (DELTASONE) tablet 50 mg (has no administration in time range)  guaiFENesin-dextromethorphan (ROBITUSSIN DM) 100-10 MG/5ML syrup 10 mL (has no administration in time range)  chlorpheniramine-HYDROcodone (TUSSIONEX) 10-8 MG/5ML suspension 5 mL (has no administration in time range)  ascorbic acid (VITAMIN C) tablet 500 mg (500 mg Oral Given 01/26/22 1449)  zinc sulfate capsule 220 mg (220 mg Oral Given 01/26/22 1437)  acetaminophen (TYLENOL) tablet 650 mg (has no administration in time range)  oxyCODONE (Oxy IR/ROXICODONE) immediate release tablet 5 mg (has no administration in time range)  docusate sodium (COLACE) capsule 100 mg (100 mg Oral Given 01/26/22 1437)  polyethylene glycol (MIRALAX / GLYCOLAX) packet 17 g (has no administration in time range)  bisacodyl (DULCOLAX) EC tablet 5 mg (has no administration in time range)  sodium phosphate (FLEET) 7-19 GM/118ML enema 1 enema (has no administration in time range)  ondansetron (ZOFRAN) tablet 4 mg (has no administration in time range)    Or  ondansetron (ZOFRAN) injection 4 mg (has no administration in time range)  diltiazem (CARDIZEM CD) 24 hr capsule 180 mg (180 mg Oral Given 01/26/22 1437)  remdesivir 200 mg in sodium chloride 0.9% 250 mL IVPB (has no administration in time range)    Followed by  remdesivir 100 mg in sodium chloride 0.9 % 100 mL IVPB (has no administration in time range)  sodium chloride 0.9 % bolus 250 mL (0 mLs Intravenous Stopped 01/26/22 1115)  acetaminophen (TYLENOL) tablet 650 mg (650 mg Oral Given 01/26/22 1100)    ED Course/ Medical Decision Making/ A&P                           Medical Decision Making Amount and/or Complexity of Data Reviewed Labs: ordered. Radiology: ordered.  Risk OTC drugs. Decision regarding hospitalization.   This patient presents to the ED for concern of weakness, fall, cough,  this involves an extensive number of treatment options, and is a complaint that carries with it a high risk of complications and morbidity.  The differential diagnosis includes but not limited to viral illness, COVID, UTI, CHF, sepsis, PNA   Co morbidities that complicate the patient evaluation  GERD, hyperlipidemia, asthma, RA, hypertension, diabetes, severe aortic stenosis, A-fib   Additional history obtained:  Additional history obtained from EMS- provided with nebs and supplemental O2 with improvement.  External records from outside source obtained and reviewed including echo 05/28/21 EF 60%   Lab Tests:  I Ordered, and personally interpreted labs.  The pertinent results include: BNP mildly elevated at 585.  Hepatic function normal.  BMP with elevated glucose at 239.  Viral swab positive for COVID, negative for flu and RSV.  Initial lactic elevated at 3.1, repeat slightly improved at 2.4.  Urinalysis is positive for protein, glucose, crystals.  CBC with normal WBC.   Imaging Studies ordered:  I ordered imaging studies including chest x-ray, CT head I independently visualized and interpreted imaging which showed CT head without acute traumatic findings.  Chest x-ray with cardiomegaly and pulmonary vascular congestion without frank edema.   I agree with the radiologist interpretation   Cardiac Monitoring: / EKG:  The patient was maintained on a cardiac monitor.  I personally viewed and interpreted the cardiac monitored which showed an underlying rhythm of: Paced, rate 60   Consultations Obtained:  I requested consultation with the ER attending, Dr. Jeanell Sparrow,  and discussed lab and imaging findings as well as pertinent plan - they recommend: Agrees with plan of care.  Given patient's vascular congestion on chest x-ray, concern for new oxygen requirement, recommends to 50 cc bolus in addition to the 500 already given by EMS for patient's elevated lactic acid Case discussed with Dr. Lorin Mercy  with [facility] who will consult for admission   Problem List / ED Course / Critical interventions / Medication management  75 year old female brought in by EMS from home after patient fell out of her bed  today feeling generally weak with viral respiratory illness.  She was found to have a new oxygen requirement with EMS, provided with nebulizer treatments.  Patient is on blood thinners, denies hitting her head however given concern for fall on thinners and her generalized weakness, CT of the head was obtained and is negative for acute injury.  Workup reveals COVID-positive test likely explaining her fever, respiratory symptoms and generalized weakness today.  Her lactic acid is elevated, she was provided with gentle fluid resuscitation with improvement in her lactic.  Fever likely viral, not provided with antibiotics.  Admitted to hospital service for further treatment. I ordered medication including Tylenol, IV fluids for fever, elevated lactic acid Reevaluation of the patient after these medicines showed that the patient improved I have reviewed the patients home medicines and have made adjustments as needed   Social Determinants of Health:  Lives with family   Test / Admission - Considered:  Admit for further treatment and monitoring         Final Clinical Impression(s) / ED Diagnoses Final diagnoses:  COVID    Rx / DC Orders ED Discharge Orders     None         Tacy Learn, PA-C 01/26/22 1515    Pattricia Boss, MD 01/26/22 1714

## 2022-01-26 NOTE — ED Notes (Signed)
ED TO INPATIENT HANDOFF REPORT  ED Nurse Name and Phone #: [name] Name/Age/Gender Briana Foster 76 y.o. female Room/Bed: 045C/045C  Code Status   Code Status: Full Code  Home/SNF/Other Home Patient oriented to: self, place, time, and situation Is this baseline? Yes   Triage Complete: Triage complete  Chief Complaint COVID-19 virus infection [U07.1]  Triage Note  Slid out of bed today and when EMS arrived noted SOB, congestion, cough.  Family members covid positive. H/o MI, has a pacemaker, asthma Given 500cc [place], duox1, albuterol x1 en route.  Arrives on 2L O2 for O2 sat 91% en route.  170/98, 99%, 60.     Allergies Allergies  Allergen Reactions   Bee Venom Shortness Of Breath and Rash   Claritin [Loratadine] Itching and Palpitations   Wasp Venom Other (See Comments) and Anaphylaxis   Apricot Flavor Swelling    Eyes swell shut   Atorvastatin Itching    Eye swelling Other reaction(s): HA    Level of Care/Admitting Diagnosis ED Disposition     ED Disposition  Admit   Condition  --   Comment  Hospital Area: [place] [[id number]]  Level of Care: Telemetry Medical [[id number]]  May place patient in observation at [facility] or [facility] if equivalent level of care is available:: Yes  Covid Evaluation: Confirmed COVID Positive  Diagnosis: COVID-19 virus infection [id number]  Admitting Physician: Karmen Bongo [[id number]]  Attending Physician: Karmen Bongo [[id number]]          B Medical/Surgery History Past Medical History:  Diagnosis Date   Anemia    Arthritis 07-20-12   hands   Asthma    Back pain    Carotid arterial disease ([place])    Class 1 obesity with serious comorbidity and body mass index (BMI) of 31.0 to 31.9 in adult 03/26/2018   Depression    Essential hypertension 03/26/2018   GERD (gastroesophageal reflux disease)    "comes and goes" - no meds currently   Hyperlipidemia    Joint pain    Persistent atrial fibrillation  ([place])    Restless leg syndrome    Rheumatoid arthritis ([place])    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 Angelena Form & Dr. Roxy Manns   Severe aortic stenosis    Type 2 diabetes mellitus without complication, without long-term current use of insulin ([place]) 04/10/2018   Vitamin D deficiency    Past Surgical History:  Procedure Laterality Date   AV NODE ABLATION N/A 07/14/2021   Procedure: AV NODE ABLATION;  Surgeon: Constance Haw, MD;  Location: [facility];  Service: Cardiovascular;  Laterality: N/A;   BUNIONECTOMY  20 yrs ago   bil feet   BUNIONECTOMY  11/30/2011   Procedure: BUNIONECTOMY;  Surgeon: Magnus Sinning, MD;  Location: [facility];  Service: Orthopedics;  Laterality: Bilateral;  RIGHT FOOT EXCISION OF BUNIONETTE AND PARTIAL   PROXIMAL PHALANGECTOMY OF 5TH TOE LEFT FOOT FUNK BUNIONECTOMY,EXCISION OF BUNIONETTE    CARDIOVERSION N/A 11/05/2019   Procedure: CARDIOVERSION;  Surgeon: Lelon Perla, MD;  Location: [facility] ENDOSCOPY;  Service: Cardiovascular;  Laterality: N/A;   CARDIOVERSION N/A 12/05/2019   Procedure: CARDIOVERSION;  Surgeon: Elouise Munroe, MD;  Location: [facility];  Service: Cardiovascular;  Laterality: N/A;   COLONOSCOPY WITH PROPOFOL N/A 08/07/2012   Procedure: COLONOSCOPY WITH PROPOFOL;  Surgeon: Garlan Fair, MD;  Location: [facility];  Service: Endoscopy;  Laterality: N/A;   MOUTH SURGERY     teeth extractions   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   Procedure: PACEMAKER IMPLANT;  Surgeon: Constance Haw, MD;  Location: [facility];  Service: Cardiovascular;  Laterality: N/A;   RIGHT/LEFT HEART CATH AND CORONARY ANGIOGRAPHY N/A 06/17/2020   Procedure: RIGHT/LEFT HEART CATH AND CORONARY ANGIOGRAPHY;  Surgeon: Belva Crome, MD;  Location: [facility];  Service: Cardiovascular;  Laterality: N/A;   TONSILLECTOMY     TRANSCATHETER AORTIC VALVE REPLACEMENT,  TRANSFEMORAL N/A 06/30/2020   Procedure: TRANSCATHETER AORTIC VALVE REPLACEMENT, TRANSFEMORAL;  Surgeon: Burnell Blanks, MD;  Location: [facility];  Service: Open Heart Surgery;  Laterality: N/A;   TUBAL LIGATION     WRIST SURGERY       A IV Location/Drains/Wounds Patient Lines/Drains/Airways Status     Active Line/Drains/Airways     Name Placement date Placement time Site Days   Peripheral IV 01/26/22 20 G Distal;Posterior;Right Forearm 01/26/22  0805  Forearm  less than 1   External Urinary Catheter 01/26/22  1240  --  less than 1            Intake/Output Last 24 hours  Intake/Output Summary (Last 24 hours) at 01/26/2022 1516 Last data filed at 01/26/2022 1115 Gross per 24 hour  Intake 250 ml  Output --  Net 250 ml    Labs/Imaging Results for orders placed or performed during the hospital encounter of 01/26/22 (from the past 48 hour(s))  Basic metabolic panel     Status: Abnormal   Collection Time: 01/26/22  6:05 AM  Result Value Ref Range   Sodium 137 135 - 145 mmol/L   Potassium 3.7 3.5 - 5.1 mmol/L   Chloride 106 98 - 111 mmol/L   CO2 21 (L) 22 - 32 mmol/L   Glucose, Bld 239 (H) 70 - 99 mg/dL    Comment: Glucose reference range applies only to samples taken after fasting for at least 8 hours.   BUN 10 8 - 23 mg/dL   Creatinine, Ser 0.96 0.44 - 1.00 mg/dL   Calcium 9.0 8.9 - 10.3 mg/dL   GFR, Estimated >60 >60 mL/min    Comment: (NOTE) Calculated using the CKD-EPI Creatinine Equation (2021)    Anion gap 10 5 - 15    Comment: Performed at [facility] [address], [place] [address]  CBC     Status: None   Collection Time: 01/26/22  6:05 AM  Result Value Ref Range   WBC 10.2 4.0 - 10.5 K/uL   RBC 3.92 3.87 - 5.11 MIL/uL   Hemoglobin 12.1 12.0 - 15.0 g/dL   HCT 36.6 36.0 - 46.0 %   MCV 93.4 80.0 - 100.0 fL   MCH 30.9 26.0 - 34.0 pg   MCHC 33.1 30.0 - 36.0 g/dL   RDW 13.4 11.5 - 15.5 %   Platelets 178 150 - 400 K/uL    nRBC 0.0 0.0 - 0.2 %    Comment: Performed at [facility], [place] [address] [place], [place] [address]  Resp panel by RT-PCR (RSV, Flu A&B, Covid) Anterior Nasal Swab     Status: Abnormal   Collection Time: 01/26/22  7:35 AM   Specimen: Anterior Nasal Swab  Result Value Ref Range   SARS Coronavirus 2 by RT PCR POSITIVE (A) NEGATIVE    Comment: (NOTE) SARS-CoV-2 target nucleic acids are DETECTED.  The SARS-CoV-2 RNA is generally  detectable in upper respiratory specimens during the acute phase of infection. Positive results are indicative of the presence of the identified virus, but do not rule out bacterial infection or co-infection with other pathogens not detected by the test. Clinical correlation with patient history and other diagnostic information is necessary to determine patient infection status. The expected result is Negative.  Fact Sheet for Patients: EntrepreneurPulse.com.au  Fact Sheet for Healthcare Providers: IncredibleEmployment.be  This test is not yet approved or cleared by the [place] FDA and  has been authorized for detection and/or diagnosis of SARS-CoV-2 by FDA under an Emergency Use Authorization (EUA).  This EUA will remain in effect (meaning this test can be used) for the duration of  the COVID-19 declaration under Section 564(b)(1) of the A ct, 21 U.S.C. section 360bbb-3(b)(1), unless the authorization is terminated or revoked sooner.     Influenza A by PCR NEGATIVE NEGATIVE   Influenza B by PCR NEGATIVE NEGATIVE    Comment: (NOTE) The Xpert Xpress SARS-CoV-2/FLU/RSV plus assay is intended as an aid in the diagnosis of influenza from Nasopharyngeal swab specimens and should not be used as a sole basis for treatment. Nasal washings and aspirates are unacceptable for Xpert Xpress SARS-CoV-2/FLU/RSV testing.  Fact Sheet for Patients: EntrepreneurPulse.com.au  Fact Sheet for Healthcare  Providers: IncredibleEmployment.be  This test is not yet approved or cleared by the [place] FDA and has been authorized for detection and/or diagnosis of SARS-CoV-2 by FDA under an Emergency Use Authorization (EUA). This EUA will remain in effect (meaning this test can be used) for the duration of the COVID-19 declaration under Section 564(b)(1) of the Act, 21 U.S.C. section 360bbb-3(b)(1), unless the authorization is terminated or revoked.     Resp Syncytial Virus by PCR NEGATIVE NEGATIVE    Comment: (NOTE) Fact Sheet for Patients: EntrepreneurPulse.com.au  Fact Sheet for Healthcare Providers: IncredibleEmployment.be  This test is not yet approved or cleared by the [place] FDA and has been authorized for detection and/or diagnosis of SARS-CoV-2 by FDA under an Emergency Use Authorization (EUA). This EUA will remain in effect (meaning this test can be used) for the duration of the COVID-19 declaration under Section 564(b)(1) of the Act, 21 U.S.C. section 360bbb-3(b)(1), unless the authorization is terminated or revoked.  Performed at [facility], [place] [address], [place], [place] [address]   Lactic acid, plasma     Status: Abnormal   Collection Time: 01/26/22  8:05 AM  Result Value Ref Range   Lactic Acid, Venous 3.1 (HH) 0.5 - 1.9 mmol/L    Comment: CRITICAL RESULT CALLED TO, READ BACK BY AND VERIFIED WITH Arby Barrette RN 01/26/2022 [id number] AMIRHESNI F Performed at [facility], [place] [address] [place], [place] [address]   Protime-INR     Status: None   Collection Time: 01/26/22  8:05 AM  Result Value Ref Range   Prothrombin Time 14.9 11.4 - 15.2 seconds   INR 1.2 0.8 - 1.2    Comment: (NOTE) INR goal varies based on device and disease states. Performed at [facility], [place] [address], [place], [place] [address]   APTT     Status: None   Collection Time: 01/26/22  8:05 AM  Result Value Ref  Range   aPTT 31 24 - 36 seconds    Comment: Performed at [facility] [address] [place], [place] [address]  Hepatic function panel     Status: None   Collection Time: 01/26/22  8:05 AM  Result Value Ref Range   Total Protein 6.8 6.5 - 8.1 g/dL   Albumin 3.8 3.5 - 5.0 g/dL   AST 31 15 - 41 U/L   ALT 30 0 - 44 U/L   Alkaline Phosphatase 65 38 - 126 U/L   Total Bilirubin 0.9 0.3 - 1.2 mg/dL   Bilirubin, Direct 0.1 0.0 - 0.2 mg/dL   Indirect Bilirubin 0.8 0.3 - 0.9 mg/dL    Comment: Performed at [facility], [address] [place], [place] [address]  Brain natriuretic peptide     Status: Abnormal   Collection Time: 01/26/22  8:05 AM  Result Value Ref Range   B Natriuretic Peptide 585.9 (H) 0.0 - 100.0 pg/mL    Comment: Performed at [facility], [address] [place], [place] [address]  Lactic acid, plasma     Status: Abnormal   Collection Time: 01/26/22 10:55 AM  Result Value Ref Range   Lactic Acid, Venous 2.4 (HH) 0.5 - 1.9 mmol/L    Comment: CRITICAL VALUE NOTED. VALUE IS CONSISTENT WITH PREVIOUSLY REPORTED/CALLED VALUE Performed at [facility], [address] [place], [place] [address]   Urinalysis, Routine w reflex microscopic Urine, Clean Catch     Status: Abnormal   Collection Time: 01/26/22 11:55 AM  Result Value Ref Range   Color, Urine YELLOW YELLOW   APPearance CLEAR CLEAR   Specific Gravity, Urine 1.023 1.005 - 1.030   pH 5.0 5.0 - 8.0   Glucose, UA 50 (A) NEGATIVE mg/dL   Hgb urine dipstick NEGATIVE NEGATIVE   Bilirubin Urine NEGATIVE NEGATIVE   Ketones, ur NEGATIVE NEGATIVE mg/dL   Protein, ur 30 (A) NEGATIVE mg/dL   Nitrite NEGATIVE NEGATIVE   Leukocytes,Ua NEGATIVE NEGATIVE   RBC / HPF 0-5 0 - 5 RBC/hpf   WBC, UA 0-5 0 - 5 WBC/hpf   Bacteria, UA NONE SEEN NONE SEEN   Squamous Epithelial / LPF 0-5 0 - 5 /HPF   Mucus PRESENT    Crystals HIPPURIC ACID CRYSTALS PRESENT (A) NEGATIVE    Comment: Performed at [facility], [address], [place], [place] [address]   CT Head Wo Contrast  Result Date: 01/26/2022 CLINICAL DATA:  Head trauma, minor (Age >= 65y) EXAM: CT HEAD WITHOUT CONTRAST TECHNIQUE: Contiguous axial images were obtained from the base of the skull through the vertex without intravenous contrast. RADIATION DOSE REDUCTION: This exam was performed according to the departmental dose-optimization program which includes automated exposure control, adjustment of the mA and/or kV according to patient size and/or use of iterative reconstruction technique. COMPARISON:  CT head 08/31/2021. FINDINGS: Brain: No evidence of acute infarction, hemorrhage, hydrocephalus, extra-axial collection or mass lesion/mass effect. Cerebral atrophy. Vascular: No hyperdense vessel identified. Skull: No acute fracture. Sinuses/Orbits: Clear sinuses. Other: No mastoid effusions. IMPRESSION: No evidence of acute intracranial abnormality. Electronically Signed   By: Feliberto Harts M.D.   On: 01/26/2022 08:42   DG Chest 2 View  Result Date: 01/26/2022 CLINICAL DATA:  76 year old female with history of shortness of breath, weakness and cough. EXAM: CHEST - 2 VIEW COMPARISON:  Chest x-ray 11/17/2021. FINDINGS: Lung volumes are slightly low. No consolidative airspace disease. No pleural effusions. No pneumothorax. Cephalization of the pulmonary vasculature, without frank pulmonary edema. Heart size is mildly enlarged. Upper mediastinal contours are within normal limits. Atherosclerotic calcifications are noted in the thoracic aorta. Left-sided pacemaker device in place with lead tip projecting over the expected  location of the right ventricle. Status post TAVR. Severe calcifications of the mitral annulus again noted. IMPRESSION: 1. Cardiomegaly with pulmonary venous congestion, but no frank pulmonary edema. 2. Aortic atherosclerosis. Electronically Signed   By: Vinnie Langton M.D.   On: 01/26/2022 06:47    Pending  Labs Unresulted Labs (From admission, onward)     Start     Ordered   01/27/22 0500  CBC with Differential/Platelet  Daily,   R      01/26/22 1343   01/27/22 0500  Comprehensive metabolic panel  Daily,   R      01/26/22 1343   01/26/22 1341  C-reactive protein  Once,   R        01/26/22 1343   01/26/22 1341  Ferritin  Once,   R        01/26/22 1343   01/26/22 1341  Fibrinogen  Once,   R        01/26/22 1343   01/26/22 1341  Lactate dehydrogenase  Once,   R        01/26/22 1343   01/26/22 1341  Procalcitonin  Once,   R        01/26/22 1343   01/26/22 1340  D-dimer, quantitative  Once,   R        01/26/22 1343   01/26/22 0736  Blood Culture (routine x 2)  (Undifferentiated presentation (screening labs and basic nursing orders))  BLOOD CULTURE X 2,   STAT      01/26/22 0736   01/26/22 0736  Urine Culture  (Undifferentiated presentation (screening labs and basic nursing orders))  ONCE - URGENT,   URGENT       Question:  Indication  Answer:  Sepsis   01/26/22 0736            Vitals/Pain Today's Vitals   01/26/22 1100 01/26/22 1130 01/26/22 1449 01/26/22 1500  BP: (!) 149/69 136/66  (!) 166/75  Pulse: (!) 59 60  61  Resp: (!) 21 (!) 23  19  Temp:   98.1 F (36.7 C)   TempSrc:      SpO2: 97% 97%  98%  Weight:      Height:      PainSc:        Isolation Precautions Airborne and Contact precautions  Medications Medications  digoxin (LANOXIN) tablet 0.125 mg (0.125 mg Oral Given 01/26/22 1437)  apixaban (ELIQUIS) tablet 5 mg (5 mg Oral Given 01/26/22 1437)  rOPINIRole (REQUIP) tablet 4 mg (has no administration in time range)  sodium chloride flush (NS) 0.9 % injection 3 mL (3 mLs Intravenous Given 01/26/22 1438)  sodium chloride flush (NS) 0.9 % injection 3 mL (3 mLs Intravenous Given 01/26/22 1438)  sodium chloride flush (NS) 0.9 % injection 3 mL (3 mLs Intravenous Given 01/26/22 1437)  0.9 %  sodium chloride infusion (has no administration in time range)  albuterol  (VENTOLIN HFA) 108 (90 Base) MCG/ACT inhaler 2 puff (has no administration in time range)  methylPREDNISolone sodium succinate (SOLU-MEDROL) 125 mg/2 mL injection 40.625 mg (40.625 mg Intravenous Given 01/26/22 1439)    Followed by  predniSONE (DELTASONE) tablet 50 mg (has no administration in time range)  guaiFENesin-dextromethorphan (ROBITUSSIN DM) 100-10 MG/5ML syrup 10 mL (has no administration in time range)  chlorpheniramine-HYDROcodone (TUSSIONEX) 10-8 MG/5ML suspension 5 mL (has no administration in time range)  ascorbic acid (VITAMIN C) tablet 500 mg (500 mg Oral Given 01/26/22 1449)  zinc sulfate capsule 220 mg (220  mg Oral Given 01/26/22 1437)  acetaminophen (TYLENOL) tablet 650 mg (has no administration in time range)  oxyCODONE (Oxy IR/ROXICODONE) immediate release tablet 5 mg (has no administration in time range)  docusate sodium (COLACE) capsule 100 mg (100 mg Oral Given 01/26/22 1437)  polyethylene glycol (MIRALAX / GLYCOLAX) packet 17 g (has no administration in time range)  bisacodyl (DULCOLAX) EC tablet 5 mg (has no administration in time range)  sodium phosphate (FLEET) 7-19 GM/118ML enema 1 enema (has no administration in time range)  ondansetron (ZOFRAN) tablet 4 mg (has no administration in time range)    Or  ondansetron (ZOFRAN) injection 4 mg (has no administration in time range)  diltiazem (CARDIZEM CD) 24 hr capsule 180 mg (180 mg Oral Given 01/26/22 1437)  remdesivir 200 mg in sodium chloride 0.9% 250 mL IVPB (has no administration in time range)    Followed by  remdesivir 100 mg in sodium chloride 0.9 % 100 mL IVPB (has no administration in time range)  sodium chloride 0.9 % bolus 250 mL (0 mLs Intravenous Stopped 01/26/22 1115)  acetaminophen (TYLENOL) tablet 650 mg (650 mg Oral Given 01/26/22 1100)    Mobility walks Moderate fall risk   Focused Assessments    R Recommendations: See Admitting Provider Note  Report given to:   Additional Notes:

## 2022-01-26 NOTE — H&P (Addendum)
History and Physical    Patient: Briana Foster DOB: 1945/12/04 DOA: 01/26/2022 DOS: the patient was seen and examined on 01/26/2022 PCP: Wenda Low, MD  Patient coming from: Home - lives with daughter; NOK: Daughter, Amillia Soria, [phone]   Chief Complaint: Generalized weakness  HPI: Briana Foster is a 76 y.o. female with medical history significant of CAD, HTN, afib, RA, and DM presenting with generalized weakness.  The patient reports that she started with URI-type symptoms yesterday.  She went to get out of bed today and slid to the floor and then was unable to get up.  She has had cough, myalgias, SOB.  She is not on home O2 and EMS reported hypoxia and started her on [place] O2.  Her daughter who lives with her is also sick.    ER Course:  Woke up weak, fell out of bed.  SOB, hypoxia on 2L. Febrile. Has COVID.  Lactate 3.1, given 750 cc IVF.  CXR with congestion.     Review of Systems: As mentioned in the history of present illness. All other systems reviewed and are negative. 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 Angelena Form & Dr. Roxy Manns   Severe aortic stenosis    Type 2 diabetes mellitus without complication, without long-term current use of insulin ([place]) 04/10/2018   Vitamin D deficiency    Past Surgical History:  Procedure Laterality Date   AV NODE ABLATION N/A  07/14/2021   Procedure: AV NODE ABLATION;  Surgeon: Constance Haw, MD;  Location: [facility];  Service: Cardiovascular;  Laterality: N/A;   BUNIONECTOMY  20 yrs ago   bil feet   BUNIONECTOMY  11/30/2011   Procedure: BUNIONECTOMY;  Surgeon: Magnus Sinning, MD;  Location: [facility];  Service: Orthopedics;  Laterality: Bilateral;  RIGHT FOOT EXCISION OF BUNIONETTE AND PARTIAL   PROXIMAL PHALANGECTOMY OF 5TH TOE LEFT FOOT FUNK BUNIONECTOMY,EXCISION OF BUNIONETTE    CARDIOVERSION N/A 11/05/2019   Procedure: CARDIOVERSION;  Surgeon: Lelon Perla, MD;  Location: [facility];  Service: Cardiovascular;  Laterality: N/A;   CARDIOVERSION N/A 12/05/2019   Procedure: CARDIOVERSION;  Surgeon: Elouise Munroe, MD;  Location: [place];  Service: Cardiovascular;  Laterality: N/A;   COLONOSCOPY WITH PROPOFOL N/A 08/07/2012   Procedure: COLONOSCOPY WITH PROPOFOL;  Surgeon: Garlan Fair, MD;  Location: [facility];  Service: Endoscopy;  Laterality: N/A;   MOUTH SURGERY     teeth extractions   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   Procedure: PACEMAKER IMPLANT;  Surgeon: Constance Haw, MD;  Location: [place] [facility];  Service: Cardiovascular;  Laterality: N/A;   RIGHT/LEFT HEART CATH AND CORONARY ANGIOGRAPHY N/A 06/17/2020   Procedure: RIGHT/LEFT HEART CATH AND CORONARY ANGIOGRAPHY;  Surgeon: Belva Crome, MD;  Location:  [facility];  Service: Cardiovascular;  Laterality: N/A;   TONSILLECTOMY     TRANSCATHETER AORTIC VALVE REPLACEMENT, TRANSFEMORAL N/A 06/30/2020   Procedure: TRANSCATHETER AORTIC VALVE REPLACEMENT, TRANSFEMORAL;  Surgeon: Kathleene Hazel, MD;  Location: [facility];  Service: Open Heart Surgery;  Laterality: N/A;   TUBAL LIGATION     WRIST SURGERY     Social History:  reports that she has never smoked. She has never used smokeless tobacco. She reports that she does not drink alcohol and does not use drugs.  Allergies  Allergen Reactions    Bee Venom Shortness Of Breath and Rash   Claritin [Loratadine] Itching and Palpitations   Wasp Venom Other (See Comments) and Anaphylaxis   Apricot Flavor Swelling    Eyes swell shut   Atorvastatin Itching    Eye swelling Other reaction(s): HA    Family History  Problem Relation Age of Onset   Heart disease Mother    Stroke Mother    Cancer Father        Prostate    Prior to Admission medications   Medication Sig Start Date End Date Taking? Authorizing Provider  acetaminophen (TYLENOL) 500 MG tablet Take 500-1,000 mg by mouth every 8 (eight) hours as needed for moderate pain.    [provider]  albuterol (VENTOLIN HFA) 108 (90 Base) MCG/ACT inhaler Inhale 2 puffs into the lungs every 4 (four) hours as needed for wheezing or shortness of breath.    [provider]  amLODipine (NORVASC) 10 MG tablet Take 1 tablet (10 mg total) by mouth daily. 11/03/21   Gaston Islam., NP  apixaban (ELIQUIS) 5 MG TABS tablet Take 1 tablet (5 mg total) by mouth 2 (two) times daily. 06/18/21   Lyn Records, MD  cetirizine (ZYRTEC) 10 MG tablet Take 10 mg by mouth daily as needed for allergies.    [provider]  Cyanocobalamin (VITAMIN B-12 PO) Take 1 tablet by mouth daily.    [provider]  diclofenac Sodium (VOLTAREN) 1 % GEL Apply 2 g topically 4 (four) times daily. 11/09/21   Amin, Loura Halt, MD  digoxin (LANOXIN) 0.125 MG tablet Take 1 tablet (0.125 mg total) by mouth daily. 06/11/21   Janetta Hora, PA-C  diltiazem (DILACOR XR) 180 MG 24 hr capsule Take 180 mg by mouth daily.    [provider]  docusate sodium (COLACE) 100 MG capsule Take 1 capsule (100 mg total) by mouth 2 (two) times daily. 11/09/21   Amin, Loura Halt, MD  meclizine (ANTIVERT) 25 MG tablet Take 25 mg by mouth 2 (two) times daily as needed (vertigo). 11/12/20   [provider]  metFORMIN (GLUCOPHAGE-XR) 750 MG 24 hr tablet Take 750 mg by mouth daily.  11/12/20   [provider]  Multiple Vitamins-Minerals (MULTIVITAMIN WITH MINERALS) tablet Take 1 tablet by mouth daily.    [provider]  polyethylene glycol powder (GLYCOLAX/MIRALAX) 17 GM/SCOOP powder Take 17 g by mouth 2 (two) times daily as needed for moderate constipation or severe constipation. 11/09/21   Amin, Loura Halt, MD  rOPINIRole (REQUIP) 4 MG tablet Take 4 mg by mouth at bedtime.    [provider]  rosuvastatin (CRESTOR) 20 MG tablet Take 1 tablet (20 mg total) by mouth daily in the afternoon. Patient taking differently: Take 20 mg by mouth every other day. 09/15/20   Lyn Records, MD  tamsulosin (FLOMAX) 0.4 MG CAPS capsule Take 1 capsule (0.4  mg total) by mouth daily after supper. 11/09/21   Dimple Nanas, MD    Physical Exam: Vitals:   01/26/22 1130 01/26/22 1449 01/26/22 1500 01/26/22 1634  BP: 136/66  (!) 166/75 (!) 178/71  Pulse: 60  61 62  Resp: (!) 23  19 16   Temp:  98.1 F (36.7 C)  98.6 F (37 C)  TempSrc:    Oral  SpO2: 97%  98% 96%  Weight:      Height:       General:  Appears calm and comfortable and is in NAD, n [place] O2 Eyes:  EOMI, normal lids, iris ENT:  grossly normal hearing, lips & tongue, mmm; edentulous, tongue and facial movements from discomfort of not having dentures in throughout evaluation Neck:  no LAD, masses or thyromegaly Cardiovascular:  RRR, no r/g, 4/6 systolic murmur. No LE edema.  Respiratory:   CTA bilaterally with no wheezes/rales/rhonchi.  Normal to mildly increased respiratory effort on 2L  O2. Abdomen:  soft, NT, ND Skin:  no rash or induration seen on limited exam Musculoskeletal:  grossly normal tone BUE/BLE, good ROM, no bony abnormality Psychiatric:  eccentric mood and affect, speech fluent and appropriate, AOx3 Neurologic:  CN 2-12 grossly intact, moves all extremities in coordinated fashion   Radiological Exams on Admission: Independently reviewed - see discussion in A/P where  applicable  CT Head Wo Contrast  Result Date: 01/26/2022 CLINICAL DATA:  Head trauma, minor (Age >= 65y) EXAM: CT HEAD WITHOUT CONTRAST TECHNIQUE: Contiguous axial images were obtained from the base of the skull through the vertex without intravenous contrast. RADIATION DOSE REDUCTION: This exam was performed according to the departmental dose-optimization program which includes automated exposure control, adjustment of the mA and/or kV according to patient size and/or use of iterative reconstruction technique. COMPARISON:  CT head 08/31/2021. FINDINGS: Brain: No evidence of acute infarction, hemorrhage, hydrocephalus, extra-axial collection or mass lesion/mass effect. Cerebral atrophy. Vascular: No hyperdense vessel identified. Skull: No acute fracture. Sinuses/Orbits: Clear sinuses. Other: No mastoid effusions. IMPRESSION: No evidence of acute intracranial abnormality. Electronically Signed   By: 10/31/2021 M.D.   On: 01/26/2022 08:42   DG Chest 2 View  Result Date: 01/26/2022 CLINICAL DATA:  76 year old female with history of shortness of breath, weakness and cough. EXAM: CHEST - 2 VIEW COMPARISON:  Chest x-ray 11/17/2021. FINDINGS: Lung volumes are slightly low. No consolidative airspace disease. No pleural effusions. No pneumothorax. Cephalization of the pulmonary vasculature, without frank pulmonary edema. Heart size is mildly enlarged. Upper mediastinal contours are within normal limits. Atherosclerotic calcifications are noted in the thoracic aorta. Left-sided pacemaker device in place with lead tip projecting over the expected location of the right ventricle. Status post TAVR. Severe calcifications of the mitral annulus again noted. IMPRESSION: 1. Cardiomegaly with pulmonary venous congestion, but no frank pulmonary edema. 2. Aortic atherosclerosis. Electronically Signed   By: 11/19/2021 M.D.   On: 01/26/2022 06:47    EKG: Independently reviewed.  Ventricular paced with rate 60;  prolonged QTc 580   Labs on Admission: I have personally reviewed the available labs and imaging studies at the time of the admission.  Pertinent labs:    Glucose 239 WBC 10.2 COVID POSITIVE Lactate 3.1, 2.4 BNP 585.9 LDH 246 CRP 1.3 Procalcitonin <0.10 D-dimer 1.12 Fibrinogen 369   Assessment and Plan: Principal Problem:   COVID-19 virus infection Active Problems:   Essential hypertension   Type 2 diabetes mellitus without complication, without long-term current use of insulin (HCC)  Persistent atrial fibrillation (HCC)   Restless leg syndrome   Hyperlipidemia   Chronic diastolic CHF (congestive heart failure) ([place])    COVID-19 Infection -Patient with presenting with SOB and reported hypoxia at home; also with cough productive of clear sputum -She does not have a usual home O2 requirement and is currently requiring 2L [place] O2 (although no hypoxia documented here) -COVID POSITIVE -The patient has comorbidities which may increase the risk for ARDS/MODS including: age, HTN, DM, CAD -Pertinent labs concerning for COVID include normal CBC; increased LDH; markedly elevated D-dimer (>1); low procalcitonin; mildly elevated CRP  -CXR unremarkable -Will not treat with broad-spectrum antibiotics given procalcitonin <0.5 -Will observe for further evaluation, close monitoring, and treatment -Monitor on telemetry x at least 24 hours -At this time, will attempt to avoid use of aerosolized medications and use HFAs instead -Will order steroids and Remdesivir (pharmacy consult) given +COVID test and hypoxia <94% on room air; she is not a candidate for Paxlovid due to medication interactions -Will attempt to maintain euvolemia to a net negative fluid status -PT/OT consults -Encourage mobilization/ambulation as much as possible  DM -Recent A1c was 8.9, poorly controlled -She does not appear to be on medications for DM and is likely to need these at the time of dc -Cover with  moderate-scale SSI  HTN -Continue diltiazem but hold amlodipine - she should not be on multiple CCBs -Will cover with IV prn hydralazine  HLD -Continue rosuvastatin  CAD -No current report of CP -s/p TAVR  Chronic diastolic CHF -Echo in [id number] with preserved EF  -Has vascular congestion on CXR and was given Lasix x 1 in the ER, but appears unlikely to have decompensated heart failure at this time -Hold further Lasix, will not give IVF, will follow -Continue digoxin  Afib -Rate controlled with digoxin, diltiazem -Continue Eliquis  RLS -Continue Requip       Advance Care Planning:   Code Status: Full Code - Code status was discussed with the patient and/or family at the time of admission.  The patient would want to receive full resuscitative measures at this time.   Consults: RT; PT/OT  DVT Prophylaxis: Eliquis  Family Communication: Daughter was present throughout evaluation  Severity of Illness: The appropriate patient status for this patient is OBSERVATION. Observation status is judged to be reasonable and necessary in order to provide the required intensity of service to ensure the patient's safety. The patient's presenting symptoms, physical exam findings, and initial radiographic and laboratory data in the context of their medical condition is felt to place them at decreased risk for further clinical deterioration. Furthermore, it is anticipated that the patient will be medically stable for discharge from the hospital within 2 midnights of admission.   Author: Karmen Bongo, MD 01/26/2022 5:05 PM  For on call review [email or web address].

## 2022-01-26 NOTE — ED Provider Triage Note (Signed)
  Emergency Medicine Provider Triage Evaluation Note  MRN:  [id number]  Arrival date & time: 01/26/22    Medically screening exam initiated at 6:02 AM.   CC:   Shortness of Breath  HPI:  Briana Foster is a 76 y.o. year-old female presents to the ED with chief complaint of SOB.  Family has COVID.  Reports cough and body aches.  Wheezing with EMS, who gave nebs with improvement.  History provided by patient. ROS:  -As included in HPI PE:   Vitals:   01/26/22 0531 01/26/22 0547  BP:  (!) 148/69  Pulse: (!) 54 (!) 58  Resp:  20  Temp:  99.3 F (37.4 C)  SpO2: 97% 99%    Non-toxic appearing No respiratory distress Increased WOB MDM:  Based on signs and symptoms, COVID is highest on my differential, followed by CAP. I've ordered labs and imaging in triage to expedite lab/diagnostic workup.  Patient was informed that the remainder of the evaluation will be completed by another provider, this initial triage assessment does not replace that evaluation, and the importance of remaining in the ED until their evaluation is complete.    Roxy Horseman, PA-C 01/26/22 [phone]

## 2022-01-26 NOTE — ED Triage Notes (Signed)
Slid out of bed today and when EMS arrived noted SOB, congestion, cough.  Family members covid positive. H/o MI, has a pacemaker, asthma Given 500cc [place], duox1, albuterol x1 en route.  Arrives on 2L O2 for O2 sat 91% en route.  170/98, 99%, 60.

## 2022-01-27 DIAGNOSIS — I5032 Chronic diastolic (congestive) heart failure: Secondary | ICD-10-CM | POA: Diagnosis present

## 2022-01-27 DIAGNOSIS — I2510 Atherosclerotic heart disease of native coronary artery without angina pectoris: Secondary | ICD-10-CM | POA: Diagnosis present

## 2022-01-27 DIAGNOSIS — I4819 Other persistent atrial fibrillation: Secondary | ICD-10-CM | POA: Diagnosis present

## 2022-01-27 DIAGNOSIS — R0902 Hypoxemia: Secondary | ICD-10-CM | POA: Diagnosis present

## 2022-01-27 DIAGNOSIS — K219 Gastro-esophageal reflux disease without esophagitis: Secondary | ICD-10-CM | POA: Diagnosis present

## 2022-01-27 DIAGNOSIS — Z950 Presence of cardiac pacemaker: Secondary | ICD-10-CM | POA: Diagnosis not present

## 2022-01-27 DIAGNOSIS — Z888 Allergy status to other drugs, medicaments and biological substances status: Secondary | ICD-10-CM | POA: Diagnosis not present

## 2022-01-27 DIAGNOSIS — J45909 Unspecified asthma, uncomplicated: Secondary | ICD-10-CM | POA: Diagnosis present

## 2022-01-27 DIAGNOSIS — Z91030 Bee allergy status: Secondary | ICD-10-CM | POA: Diagnosis not present

## 2022-01-27 DIAGNOSIS — Z7984 Long term (current) use of oral hypoglycemic drugs: Secondary | ICD-10-CM | POA: Diagnosis not present

## 2022-01-27 DIAGNOSIS — Z9102 Food additives allergy status: Secondary | ICD-10-CM | POA: Diagnosis not present

## 2022-01-27 DIAGNOSIS — E785 Hyperlipidemia, unspecified: Secondary | ICD-10-CM | POA: Diagnosis present

## 2022-01-27 DIAGNOSIS — E119 Type 2 diabetes mellitus without complications: Secondary | ICD-10-CM | POA: Diagnosis present

## 2022-01-27 DIAGNOSIS — Z79899 Other long term (current) drug therapy: Secondary | ICD-10-CM | POA: Diagnosis not present

## 2022-01-27 DIAGNOSIS — Z952 Presence of prosthetic heart valve: Secondary | ICD-10-CM | POA: Diagnosis not present

## 2022-01-27 DIAGNOSIS — G2581 Restless legs syndrome: Secondary | ICD-10-CM | POA: Diagnosis present

## 2022-01-27 DIAGNOSIS — I110 Hypertensive heart disease with heart failure: Secondary | ICD-10-CM | POA: Diagnosis present

## 2022-01-27 DIAGNOSIS — U071 COVID-19: Secondary | ICD-10-CM | POA: Diagnosis not present

## 2022-01-27 DIAGNOSIS — Z8249 Family history of ischemic heart disease and other diseases of the circulatory system: Secondary | ICD-10-CM | POA: Diagnosis not present

## 2022-01-27 DIAGNOSIS — Z809 Family history of malignant neoplasm, unspecified: Secondary | ICD-10-CM | POA: Diagnosis not present

## 2022-01-27 DIAGNOSIS — Z7901 Long term (current) use of anticoagulants: Secondary | ICD-10-CM | POA: Diagnosis not present

## 2022-01-27 DIAGNOSIS — M069 Rheumatoid arthritis, unspecified: Secondary | ICD-10-CM | POA: Diagnosis present

## 2022-01-27 DIAGNOSIS — Z823 Family history of stroke: Secondary | ICD-10-CM | POA: Diagnosis not present

## 2022-01-27 LAB — COMPREHENSIVE METABOLIC PANEL
ALT: 27 U/L (ref 0–44)
AST: 24 U/L (ref 15–41)
Albumin: 3.4 g/dL — ABNORMAL LOW (ref 3.5–5.0)
Alkaline Phosphatase: 57 U/L (ref 38–126)
Anion gap: 8 (ref 5–15)
BUN: 13 mg/dL (ref 8–23)
CO2: 23 mmol/L (ref 22–32)
Calcium: 8.8 mg/dL — ABNORMAL LOW (ref 8.9–10.3)
Chloride: 106 mmol/L (ref 98–111)
Creatinine, Ser: 0.78 mg/dL (ref 0.44–1.00)
GFR, Estimated: >60 mL/min (ref >60)
Glucose, Bld: 198 mg/dL — ABNORMAL HIGH (ref 70–99)
Potassium: 4.3 mmol/L (ref 3.5–5.1)
Sodium: 137 mmol/L (ref 135–145)
Total Bilirubin: 0.8 mg/dL (ref 0.3–1.2)
Total Protein: 6.5 g/dL (ref 6.5–8.1)

## 2022-01-27 LAB — URINE CULTURE

## 2022-01-27 LAB — CBC WITH DIFFERENTIAL/PLATELET
Abs Immature Granulocytes: 0.01 10*3/uL (ref 0.00–0.07)
Basophils Absolute: 0 10*3/uL (ref 0.0–0.1)
Basophils Relative: 0 %
Eosinophils Absolute: 0 10*3/uL (ref 0.0–0.5)
Eosinophils Relative: 0 %
HCT: 35.1 % — ABNORMAL LOW (ref 36.0–46.0)
Hemoglobin: 11.4 g/dL — ABNORMAL LOW (ref 12.0–15.0)
Immature Granulocytes: 0 %
Lymphocytes Relative: 12 %
Lymphs Abs: 0.6 10*3/uL — ABNORMAL LOW (ref 0.7–4.0)
MCH: 30.7 pg (ref 26.0–34.0)
MCHC: 32.5 g/dL (ref 30.0–36.0)
MCV: 94.6 fL (ref 80.0–100.0)
Monocytes Absolute: 0.1 10*3/uL (ref 0.1–1.0)
Monocytes Relative: 3 %
Neutro Abs: 4.2 10*3/uL (ref 1.7–7.7)
Neutrophils Relative %: 85 %
Platelets: 152 10*3/uL (ref 150–400)
RBC: 3.71 MIL/uL — ABNORMAL LOW (ref 3.87–5.11)
RDW: 13.6 % (ref 11.5–15.5)
WBC: 5 10*3/uL (ref 4.0–10.5)
nRBC: 0 % (ref 0.0–0.2)

## 2022-01-27 LAB — GLUCOSE, CAPILLARY
Glucose-Capillary: 197 mg/dL — ABNORMAL HIGH (ref 70–99)
Glucose-Capillary: 270 mg/dL — ABNORMAL HIGH (ref 70–99)
Glucose-Capillary: 290 mg/dL — ABNORMAL HIGH (ref 70–99)
Glucose-Capillary: 231 mg/dL — ABNORMAL HIGH (ref 70–99)

## 2022-01-27 MED ORDER — ROPINIROLE HCL 1 MG PO TABS
4.0000 mg | ORAL_TABLET | Freq: Every day | ORAL | Status: DC
  Administered 2022-01-27: 4 mg via ORAL
  Filled 2022-01-27 (×2): qty 4

## 2022-01-27 MED ORDER — PREDNISONE 50 MG PO TABS: 50.0000 mg | ORAL_TABLET | Freq: Every day | ORAL | Status: DC

## 2022-01-27 MED ORDER — METHYLPREDNISOLONE SODIUM SUCC 125 MG IJ SOLR
80.0000 mg | Freq: Every day | INTRAMUSCULAR | Status: AC
  Administered 2022-01-27 – 2022-01-28 (×2): 80 mg via INTRAVENOUS
  Filled 2022-01-27: qty 2

## 2022-01-27 NOTE — TOC CM/SW Note (Signed)
Patient requiring bedside commode ( 3 in 1) due to , being  confined to a room with no bathroom and unable to ambulate to bathroom

## 2022-01-27 NOTE — TOC Initial Note (Addendum)
Transition of Care (TOC) - Initial/Assessment Note   Spoke to patient at bedside. Confirmed face sheet information.   Patient's daughter stays with her.    Patient has Roll[place] at home. Requesting 3 in1 . SAme ordered with [place].   PT recommending HHPT. Patient has no preference in home health agency . Cory with [facility] accepted referral for HHPT   Patient currently not on oxygen and does not have oxygen at home  Patient Details  Name: Briana Foster MRN: [id number] Date of Birth: 03-23-1945  Transition of Care [facility]) CM/SW Contact:    Marilu Favre, RN Phone Number: 01/27/2022, 1:44 PM  Clinical Narrative:                   Expected Discharge Plan: [place] Barriers to Discharge: Continued Medical Work up   Patient Goals and CMS Choice Patient states their goals for this hospitalization and ongoing recovery are:: to return to home CMS Medicare.gov Compare Post Acute Care list provided to:: Patient Choice offered to / list presented to : Patient      Expected Discharge Plan and Services   Discharge Planning Services: CM Consult Post Acute Care Choice: [place] arrangements for the past 2 months: Single Family Home                 DME Arranged: 3-N-1 DME Agency: [employer] Date DME Agency Contacted: 01/27/22 Time DME Agency Contacted: [phone] Representative spoke with at DME Agency: Erasmo Downer HH Arranged: PT [place]: [place] Date [place]: 01/27/22 Time [place]: 1343 Representative spoke with at [place]: Tommi Rumps  Prior Living Arrangements/Services Living arrangements for the past 2 months: [place] Lives with:: Adult Children Patient language and need for interpreter reviewed:: Yes Do you feel safe going back to the place where you live?: Yes      Need for Family Participation in Patient Care: Yes (Comment) Care giver support system in place?: Yes (comment) Current home  services: DME Criminal Activity/Legal Involvement Pertinent to Current Situation/Hospitalization: No - Comment as needed  Activities of Daily Living      Permission Sought/Granted   Permission granted to share information with : No              Emotional Assessment Appearance:: Appears stated age Attitude/Demeanor/Rapport: Engaged Affect (typically observed): Accepting Orientation: : Oriented to Self, Oriented to Place, Oriented to  Time, Oriented to Situation Alcohol / Substance Use: Not Applicable Psych Involvement: No (comment)  Admission diagnosis:  COVID [U07.1] COVID-19 virus infection [U07.1] Patient Active Problem List   Diagnosis Date Noted   COVID-19 virus infection 01/26/2022   Chronic diastolic CHF (congestive heart failure) ([place]) 01/26/2022   Acute pyelonephritis 11/07/2021   UTI (urinary tract infection) 11/07/2021   Hyperlipidemia 11/05/2021   Abdominal pain, right lower quadrant 11/05/2021   Abdominal pain 11/04/2021   Near syncope 10/27/2020   Atrial fibrillation with slow ventricular response ([place]) 10/27/2020   LBBB (left bundle branch block) 10/27/2020   Aortic stenosis, severe 07/01/2020   Carotid arterial disease (HCC)    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 [place] [address] Phone: [phone] Fax: [phone]  [facility] [address] [place]  [address] Phone: [phone] Fax: [phone]  [facility] [address], [place] - [address] [place] [address] Phone: [phone] Fax: [phone]     Social Determinants of Health (SDOH) Social History: SDOH Screenings   Food Insecurity: No Food Insecurity (11/07/2021)  Housing: Low Risk  (11/07/2021)  Transportation Needs: No Transportation Needs (11/07/2021)  Utilities: Not At Risk (11/07/2021)  Depression (PHQ2-9): Medium Risk (03/22/2018)  Tobacco Use: Low Risk  (01/26/2022)   SDOH Interventions:     Readmission Risk Interventions    11/05/2021    3:55 PM  Readmission Risk Prevention Plan  Post Dischage Appt Complete  Medication Screening Complete  Transportation Screening Complete

## 2022-01-27 NOTE — Evaluation (Addendum)
Occupational Therapy Evaluation Patient Details Name: Briana Foster MRN: [id number] DOB: 1945/07/20 Today's Date: 01/27/2022   History of Present Illness 76 y.o. female [facility] to [facility] hospital on 01/26/2022 with generalized weakness, sliding into the floor with attempts to get out of bed. Pt found to be COVID+ with hypoxia. PMH includes OA, CAD, depression, HTN, HLD, PAF, RA, DMII.   Clinical Impression   Pt currently modified independent for selfcare tasks sit to stand and for toilet transfers without an assistive device.  She reports feeling weaker than baseline secondary to not being up but overall no LOB noted.  Feel higher level deficits are likely present and will defer to PT to continue working on.  Pt has no DME needs and O2 sats remained at 96% or better on room air.  No further acute OT needs.       Recommendations for follow up therapy are one component of a multi-disciplinary discharge planning process, led by the attending physician.  Recommendations may be updated based on patient status, additional functional criteria and insurance authorization.   Follow Up Recommendations  No OT follow up     Assistance Recommended at Discharge PRN  Patient can return home with the following Assist for transportation    Functional Status Assessment  Patient has not had a recent decline in their functional status  Equipment Recommendations  BSC/3in1       Precautions / Restrictions Precautions Precaution Comments: COVID+ Restrictions Weight Bearing Restrictions: No      Mobility Bed Mobility                    Transfers Overall transfer level: Modified independent Equipment used: None Transfers: Sit to/from Stand, Bed to chair/wheelchair/BSC Sit to Stand: Modified independent (Device/Increase time)     Step pivot transfers: Modified independent (Device/Increase time)     General transfer comment: Pt able to stand and ambulate to and from the bathroom with  slightly increased time but no LOB without assistive device.      Balance Overall balance assessment: Mild deficits observed, not formally tested                                         ADL either performed or assessed with clinical judgement   ADL Overall ADL's : Modified independent;At baseline                                       General ADL Comments: Pt is at baseline modified independent for selfcare tasks sit to stand and for toilet and simulated shower transfers.  Oxygen sats at 95% on room air as well.     Vision Baseline Vision/History: 0 No visual deficits Ability to See in Adequate Light: 0 Adequate Patient Visual Report: No change from baseline Vision Assessment?: No apparent visual deficits            Pertinent Vitals/Pain Pain Assessment Pain Assessment: No/denies pain     Hand Dominance     Extremity/Trunk Assessment Upper Extremity Assessment Upper Extremity Assessment: Overall WFL for tasks assessed   Lower Extremity Assessment Lower Extremity Assessment: Defer to PT evaluation   Cervical / Trunk Assessment Cervical / Trunk Assessment: Normal   Communication     Cognition Arousal/Alertness: Awake/alert Behavior During Therapy: [facility] for tasks assessed/performed  Overall Cognitive Status: Within Functional Limits for tasks assessed                                                  Home Living                                          Prior Functioning/Environment                           AM-PAC OT "6 Clicks" Daily Activity     Outcome Measure Help from another person eating meals?: None Help from another person taking care of personal grooming?: None Help from another person toileting, which includes using toliet, bedpan, or urinal?: None Help from another person bathing (including washing, rinsing, drying)?: None Help from another person to put on and taking  off regular upper body clothing?: None Help from another person to put on and taking off regular lower body clothing?: None 6 Click Score: 24   End of Session Nurse Communication: Mobility status  Activity Tolerance: Patient tolerated treatment well Patient left: in bed;with call bell/phone within reach                   Time: 1021-1053 OT Time Calculation (min): 32 min Charges:  OT General Charges $OT Visit: 1 Visit OT Evaluation $OT Eval Moderate Complexity: 1 Mod OT Treatments $Self Care/Home Management : 8-22 mins Sarahi Borland OTR/L 01/27/2022, 2:31 PM

## 2022-01-27 NOTE — Progress Notes (Signed)
PROGRESS NOTE    Briana Foster  [id number] DOB: Aug 20, 1945 DOA: 01/26/2022 PCP: Wenda Low, MD    Brief Narrative:  Briana Foster is a 76 y.o. female with medical history significant of CAD, HTN, afib, RA, and DM presenting with generalized weakness.  The patient reports that she started with URI-type symptoms yesterday.  She went to get out of bed today and slid to the floor and then was unable to get up.  She has had cough, myalgias, SOB.  She is not on home O2 and EMS reported hypoxia and started her on [place] O2.  Her daughter who lives with her is also sick.    Assessment and Plan: COVID-19 Infection -Patient with presenting with SOB and reported hypoxia at home; also with cough productive of clear sputum -She does not have a usual home O2 requirement and is currently requiring 2L [place] O2 (although no hypoxia documented here) -COVID POSITIVE -The patient has comorbidities which may increase the risk for ARDS/MODS including: age, HTN, DM, CAD -Pertinent labs concerning for COVID include normal CBC; increased LDH; markedly elevated D-dimer (>1); low procalcitonin; mildly elevated CRP  -CXR unremarkable -Will not treat with broad-spectrum antibiotics given procalcitonin <0.5 - steroids and Remdesivir (pharmacy consult) given +COVID test and hypoxia <94% on room air; she is not a candidate for Paxlovid due to medication interactions -Will attempt to maintain euvolemia to a net negative fluid status -Encourage mobilization/ambulation as much as possible -wean to RA and check   DM -Recent A1c was 8.9, poorly controlled -She does not appear to be on medications for DM and may need these at the time of dc -Cover with moderate-scale SSI   HTN -Continue diltiazem but hold amlodipine -Will cover with IV prn hydralazine   HLD -Continue rosuvastatin   CAD -No current report of CP -s/p TAVR   Chronic diastolic CHF -Echo in [id number] with preserved EF  -Has vascular congestion  on CXR and was given Lasix x 1 in the ER -Hold further Lasix, will not give IVF, will follow -Continue digoxin   Afib -Rate controlled with digoxin, diltiazem -Continue Eliquis   RLS -Continue Requip    DVT prophylaxis:  apixaban (ELIQUIS) tablet 5 mg    Code Status: Full Code   Disposition Plan:  Level of care: Telemetry Medical Status is: Observation The patient will require care spanning > 2 midnights and should be moved to inpatient because: tx for COVID    Consultants:  none   Subjective: Still SOB, does not wear O2 at home-- lives with family  Objective: Vitals:   01/26/22 1945 01/27/22 0010 01/27/22 0408 01/27/22 0837  BP: 111/88 (!) 152/83 (!) 142/79 (!) 155/93  Pulse: 62 63 (!) 59 62  Resp: 20 18 18 16   Temp: 98.3 F (36.8 C) 98 F (36.7 C) 98 F (36.7 C) 97.8 F (36.6 C)  TempSrc: Oral Oral Oral Oral  SpO2: 95% 98% 96% 96%  Weight:      Height:        Intake/Output Summary (Last 24 hours) at 01/27/2022 1110 Last data filed at 01/27/2022 0841 Gross per 24 hour  Intake 490 ml  Output 650 ml  Net -160 ml   Filed Weights   01/26/22 0549 01/26/22 0647  Weight: 81 kg 81 kg    Examination:   General: Appearance:     Overweight female in no acute distress     Lungs:     Clear to auscultation bilaterally, respirations unlabored  Heart:    Normal heart rate.     MS:   All extremities are intact.    Neurologic:   Awake, alert, oriented x 3. No apparent focal neurological           defect.        Data Reviewed: I have personally reviewed following labs and imaging studies  CBC: Recent Labs  Lab 01/26/22 0605 01/27/22 0146  WBC 10.2 5.0  NEUTROABS  --  4.2  HGB 12.1 11.4*  HCT 36.6 35.1*  MCV 93.4 94.6  PLT 178 0000000   Basic Metabolic Panel: Recent Labs  Lab 01/26/22 0605 01/27/22 0146  NA 137 137  K 3.7 4.3  CL 106 106  CO2 21* 23  GLUCOSE 239* 198*  BUN 10 13  CREATININE 0.96 0.78  CALCIUM 9.0 8.8*   GFR: Estimated  Creatinine Clearance: 68.9 mL/min (by C-G formula based on SCr of 0.78 mg/dL). Liver Function Tests: Recent Labs  Lab 01/26/22 0805 01/27/22 0146  AST 31 24  ALT 30 27  ALKPHOS 65 57  BILITOT 0.9 0.8  PROT 6.8 6.5  ALBUMIN 3.8 3.4*   No results for input(s): "LIPASE", "AMYLASE" in the last 168 hours. No results for input(s): "AMMONIA" in the last 168 hours. Coagulation Profile: Recent Labs  Lab 01/26/22 0805  INR 1.2   Cardiac Enzymes: No results for input(s): "CKTOTAL", "CKMB", "CKMBINDEX", "TROPONINI" in the last 168 hours. BNP (last 3 results) No results for input(s): "PROBNP" in the last 8760 hours. HbA1C: No results for input(s): "HGBA1C" in the last 72 hours. CBG: Recent Labs  Lab 01/26/22 1942 01/27/22 0841  GLUCAP 293* 197*   Lipid Profile: No results for input(s): "CHOL", "HDL", "LDLCALC", "TRIG", "CHOLHDL", "LDLDIRECT" in the last 72 hours. Thyroid Function Tests: No results for input(s): "TSH", "T4TOTAL", "FREET4", "T3FREE", "THYROIDAB" in the last 72 hours. Anemia Panel: Recent Labs    01/26/22 1500  FERRITIN 111   Sepsis Labs: Recent Labs  Lab 01/26/22 0805 01/26/22 1055 01/26/22 1500 01/26/22 1847  PROCALCITON  --   --  <0.10  --   LATICACIDVEN 3.1* 2.4*  --  1.4    Recent Results (from the past 240 hour(s))  Resp panel by RT-PCR (RSV, Flu A&B, Covid) Anterior Nasal Swab     Status: Abnormal   Collection Time: 01/26/22  7:35 AM   Specimen: Anterior Nasal Swab  Result Value Ref Range Status   SARS Coronavirus 2 by RT PCR POSITIVE (A) NEGATIVE Final    Comment: (NOTE) SARS-CoV-2 target nucleic acids are DETECTED.  The SARS-CoV-2 RNA is generally detectable in upper respiratory specimens during the acute phase of infection. Positive results are indicative of the presence of the identified virus, but do not rule out bacterial infection or co-infection with other pathogens not detected by the test. Clinical correlation with patient history  and other diagnostic information is necessary to determine patient infection status. The expected result is Negative.  Fact Sheet for Patients: EntrepreneurPulse.com.au  Fact Sheet for Healthcare Providers: IncredibleEmployment.be  This test is not yet approved or cleared by the [place] FDA and  has been authorized for detection and/or diagnosis of SARS-CoV-2 by FDA under an Emergency Use Authorization (EUA).  This EUA will remain in effect (meaning this test can be used) for the duration of  the COVID-19 declaration under Section 564(b)(1) of the A ct, 21 U.S.C. section 360bbb-3(b)(1), unless the authorization is terminated or revoked sooner.     Influenza A by  PCR NEGATIVE NEGATIVE Final   Influenza B by PCR NEGATIVE NEGATIVE Final    Comment: (NOTE) The Xpert Xpress SARS-CoV-2/FLU/RSV plus assay is intended as an aid in the diagnosis of influenza from Nasopharyngeal swab specimens and should not be used as a sole basis for treatment. Nasal washings and aspirates are unacceptable for Xpert Xpress SARS-CoV-2/FLU/RSV testing.  Fact Sheet for Patients: EntrepreneurPulse.com.au  Fact Sheet for Healthcare Providers: IncredibleEmployment.be  This test is not yet approved or cleared by the [place] FDA and has been authorized for detection and/or diagnosis of SARS-CoV-2 by FDA under an Emergency Use Authorization (EUA). This EUA will remain in effect (meaning this test can be used) for the duration of the COVID-19 declaration under Section 564(b)(1) of the Act, 21 U.S.C. section 360bbb-3(b)(1), unless the authorization is terminated or revoked.     Resp Syncytial Virus by PCR NEGATIVE NEGATIVE Final    Comment: (NOTE) Fact Sheet for Patients: EntrepreneurPulse.com.au  Fact Sheet for Healthcare Providers: IncredibleEmployment.be  This test is not yet  approved or cleared by the [place] FDA and has been authorized for detection and/or diagnosis of SARS-CoV-2 by FDA under an Emergency Use Authorization (EUA). This EUA will remain in effect (meaning this test can be used) for the duration of the COVID-19 declaration under Section 564(b)(1) of the Act, 21 U.S.C. section 360bbb-3(b)(1), unless the authorization is terminated or revoked.  Performed at [facility], [place] [address], [place], [place] [address]   Blood Culture (routine x 2)     Status: None (Preliminary result)   Collection Time: 01/26/22  7:36 AM   Specimen: BLOOD  Result Value Ref Range Status   Specimen Description BLOOD  Final   Special Requests   Final    BOTTLES DRAWN AEROBIC AND ANAEROBIC Blood Culture adequate volume   Culture   Final    NO GROWTH < 24 HOURS Performed at [facility], [place] [address] [place], [place] [address]    Report Status PENDING  Incomplete  Blood Culture (routine x 2)     Status: None (Preliminary result)   Collection Time: 01/26/22  7:41 AM   Specimen: BLOOD  Result Value Ref Range Status   Specimen Description BLOOD RIGHT ANTECUBITAL  Final   Special Requests   Final    BOTTLES DRAWN AEROBIC AND ANAEROBIC Blood Culture adequate volume   Culture   Final    NO GROWTH < 24 HOURS Performed at [facility], [place] [address] [place], [place] [address]    Report Status PENDING  Incomplete  Urine Culture     Status: Abnormal   Collection Time: 01/26/22 11:55 AM   Specimen: In/Out Cath Urine  Result Value Ref Range Status   Specimen Description IN/OUT CATH URINE  Final   Special Requests   Final    NONE Performed at [facility], [place] [address], [place],  [address]    Culture MULTIPLE SPECIES PRESENT, SUGGEST RECOLLECTION (A)  Final   Report Status 01/27/2022 FINAL  Final         Radiology Studies: CT Head Wo Contrast  Result Date: 01/26/2022 CLINICAL DATA:  Head trauma, minor (Age >= 65y)  EXAM: CT HEAD WITHOUT CONTRAST TECHNIQUE: Contiguous axial images were obtained from the base of the skull through the vertex without intravenous contrast. RADIATION DOSE REDUCTION: This exam was performed according to the departmental dose-optimization program which includes automated exposure control, adjustment of the mA and/or kV according to  patient size and/or use of iterative reconstruction technique. COMPARISON:  CT head 08/31/2021. FINDINGS: Brain: No evidence of acute infarction, hemorrhage, hydrocephalus, extra-axial collection or mass lesion/mass effect. Cerebral atrophy. Vascular: No hyperdense vessel identified. Skull: No acute fracture. Sinuses/Orbits: Clear sinuses. Other: No mastoid effusions. IMPRESSION: No evidence of acute intracranial abnormality. Electronically Signed   By: Feliberto Harts M.D.   On: 01/26/2022 08:42   DG Chest 2 View  Result Date: 01/26/2022 CLINICAL DATA:  76 year old female with history of shortness of breath, weakness and cough. EXAM: CHEST - 2 VIEW COMPARISON:  Chest x-ray 11/17/2021. FINDINGS: Lung volumes are slightly low. No consolidative airspace disease. No pleural effusions. No pneumothorax. Cephalization of the pulmonary vasculature, without frank pulmonary edema. Heart size is mildly enlarged. Upper mediastinal contours are within normal limits. Atherosclerotic calcifications are noted in the thoracic aorta. Left-sided pacemaker device in place with lead tip projecting over the expected location of the right ventricle. Status post TAVR. Severe calcifications of the mitral annulus again noted. IMPRESSION: 1. Cardiomegaly with pulmonary venous congestion, but no frank pulmonary edema. 2. Aortic atherosclerosis. Electronically Signed   By: Trudie Reed M.D.   On: 01/26/2022 06:47        Scheduled Meds:  apixaban  5 mg Oral BID   vitamin C  500 mg Oral Daily   digoxin  0.125 mg Oral Daily   diltiazem  180 mg Oral Daily   docusate sodium  100 mg  Oral BID   insulin aspart  0-15 Units Subcutaneous TID WC   insulin aspart  0-5 Units Subcutaneous QHS   methylPREDNISolone (SOLU-MEDROL) injection  0.5 mg/kg Intravenous Q12H   Followed by   Melene Muller ON 01/29/2022] predniSONE  50 mg Oral Daily   rOPINIRole  4 mg Oral QHS   sodium chloride flush  3 mL Intravenous Q12H   sodium chloride flush  3 mL Intravenous Q12H   zinc sulfate  220 mg Oral Daily   Continuous Infusions:  sodium chloride     remdesivir 100 mg in sodium chloride 0.9 % 100 mL IVPB 100 mg (01/27/22 1022)     LOS: 0 days    Time spent: 45 minutes spent on chart review, discussion with nursing staff, consultants, updating family and interview/physical exam; more than 50% of that time was spent in counseling and/or coordination of care.    Joseph Art, DO [facility] Available via Epic secure chat 7am-7pm After these hours, please refer to coverage provider listed on [email or web address] 01/27/2022, 11:10 AM

## 2022-01-27 NOTE — Evaluation (Signed)
Physical Therapy Evaluation Patient Details Name: LEATHA ROHNER MRN: [id number] DOB: 1945/11/30 Today's Date: 01/27/2022  History of Present Illness  76 y.o. female presents to [facility] hospital on 01/26/2022 with generalized weakness, sliding into the floor with attempts to get out of bed. Pt found to be COVID+ with hypoxia. PMH includes OA, CAD, depression, HTN, HLD, PAF, RA, DMII.  Clinical Impression  Pt presents to PT with deficits in functional mobility, strength, power, gait, endurance. 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 Pt is able to ambulate for limited household distances, benefiting from PT verbal cues to increase trunk flexion during transfers. Pt will benefit from aggressive mobilization in an effort to restore independence. PT recommends HHPT at the time of discharge.       Recommendations for follow up therapy are one component of a multi-disciplinary discharge planning process, led by the attending physician.  Recommendations may be updated based on patient status, additional functional criteria and insurance authorization.  Follow Up Recommendations Home health PT      Assistance Recommended at Discharge Intermittent Supervision/Assistance  Patient can return home with the following  A little help with walking and/or transfers;A little help with bathing/dressing/bathroom;Assistance with cooking/housework;Help with stairs or ramp for entrance    Equipment Recommendations None recommended by PT (owns necessary DME)  Recommendations for Other Services       Functional Status Assessment Patient has had a recent decline in their functional status and demonstrates the ability to make significant improvements in function in a reasonable and predictable amount of time.     Precautions / Restrictions Precautions Precautions: Fall Precaution Comments: COVID+ Restrictions Weight Bearing Restrictions: No       Mobility  Bed Mobility Overal bed mobility: Needs Assistance Bed Mobility: Supine to Sit     Supine to sit: Supervision, HOB elevated          Transfers Overall transfer level: Needs assistance Equipment used: None, Rolling walker (2 wheels) Transfers: Sit to/from Stand, Bed to chair/wheelchair/BSC Sit to Stand: Min guard   Step pivot transfers: Min guard       General transfer comment: increased time, verbal cues to increase trunk flexion    Ambulation/Gait Ambulation/Gait assistance: Min guard Gait Distance (Feet): 30 Feet Assistive device: Rolling walker (2 wheels) Gait Pattern/deviations: Step-through pattern Gait velocity: reduced Gait velocity interpretation: <1.8 ft/sec, indicate of risk for recurrent falls   General Gait Details: slowed step-through gait, reduced stride length  Stairs            Wheelchair Mobility    Modified Rankin (Stroke Patients Only)       Balance Overall balance assessment: Needs assistance Sitting-balance support: No upper extremity supported, Feet supported Sitting balance-Leahy Scale: Good     Standing balance support: Single extremity supported, Reliant on assistive device for balance Standing balance-Leahy Scale: Poor                               Pertinent Vitals/Pain Pain Assessment Pain Assessment: No/denies pain    Home Living Family/patient expects to be discharged to:: Private residence Living Arrangements: Children Available Help at Discharge: Family;Available 24 hours/day Type of Home: House Home Access: Stairs to enter Entrance Stairs-Rails: Can reach both Entrance Stairs-Number of Steps: 3   Home Layout: One level Home Equipment: Cane - single point;Rollator (4 wheels)      Prior Function Prior Level of Function :  Independent/Modified Independent             Mobility Comments: takes cane for community mobility       Hand Dominance   Dominant Hand: Right     Extremity/Trunk Assessment   Upper Extremity Assessment Upper Extremity Assessment: Generalized weakness    Lower Extremity Assessment Lower Extremity Assessment: Generalized weakness    Cervical / Trunk Assessment Cervical / Trunk Assessment: Normal  Communication   Communication: No difficulties  Cognition Arousal/Alertness: Awake/alert Behavior During Therapy: WFL for tasks assessed/performed Overall Cognitive Status: Within Functional Limits for tasks assessed                                          General Comments General comments (skin integrity, edema, etc.): pt on 3L [place] initially, PT weans to room air with stable sats in mid 90s during session    Exercises     Assessment/Plan    PT Assessment Patient needs continued PT services  PT Problem List Decreased strength;Decreased activity tolerance;Decreased balance;Decreased mobility;Decreased knowledge of use of DME       PT Treatment Interventions DME instruction;Gait training;Stair training;Functional mobility training;Balance training;Neuromuscular re-education;Therapeutic activities;Therapeutic exercise;Patient/family education    PT Goals (Current goals can be found in the Care Plan section)  Acute Rehab PT Goals Patient Stated Goal: to go home PT Goal Formulation: With patient Time For Goal Achievement: 02/10/22 Potential to Achieve Goals: Good    Frequency Min 3X/week     Co-evaluation               AM-PAC PT "6 Clicks" Mobility  Outcome Measure Help needed turning from your back to your side while in a flat bed without using bedrails?: A Little Help needed moving from lying on your back to sitting on the side of a flat bed without using bedrails?: A Little Help needed moving to and from a bed to a chair (including a wheelchair)?: A Little Help needed standing up from a chair using your arms (e.g., wheelchair or bedside chair)?: A Little Help needed to walk in hospital room?: A  Little Help needed climbing 3-5 steps with a railing? : A Lot 6 Click Score: 17    End of Session   Activity Tolerance: Patient tolerated treatment well Patient left: in chair;with call bell/phone within reach;with chair alarm set Nurse Communication: Mobility status PT Visit Diagnosis: Other abnormalities of gait and mobility (R26.89);Muscle weakness (generalized) (M62.81)    Time: [id number] PT Time Calculation (min) (ACUTE ONLY): 27 min   Charges:   PT Evaluation $PT Eval Low Complexity: [place], PT, DPT Acute Rehabilitation Office [phone]   Zenaida Niece 01/27/2022, 9:12 AM

## 2022-01-27 NOTE — Inpatient Diabetes Management (Signed)
Inpatient Diabetes Program Recommendations  AACE/ADA: New Consensus Statement on Inpatient Glycemic Control (2015)  Target Ranges:  Prepandial:   less than 140 mg/dL      Peak postprandial:   less than 180 mg/dL (1-2 hours)      Critically ill patients:  140 - 180 mg/dL   Lab Results  Component Value Date   GLUCAP 270 (H) 01/27/2022   HGBA1C 8.9 (H) 11/04/2021    Latest Reference Range & Units 01/26/22 19:42 01/27/22 08:41 01/27/22 12:03  Glucose-Capillary 70 - 99 mg/dL 914 (H) 782 (H) 956 (H)  (H): Data is abnormally high Review of Glycemic Control  Diabetes history: type 2 Outpatient Diabetes medications: none Current orders for Inpatient glycemic control: Novolog 0-15 units TID, Novolog 0-5 units at HS.  Inpatient Diabetes Program Recommendations:   Noted that patient 's CBGs have been greater than 180 mg/dl. Recommend adding Novolog 4 units TID with meals if eating at least 50% of meal and if blood sugars continue to be elevated. May need to consider low dose of Semglee 10 units daily if blood sugars continue to be elevated with steroids.   Smith Mince RN BSN CDE Diabetes Coordinator Pager: [phone]  8am-5pm

## 2022-01-28 DIAGNOSIS — U071 COVID-19: Secondary | ICD-10-CM | POA: Diagnosis not present

## 2022-01-28 LAB — CBC WITH DIFFERENTIAL/PLATELET
Abs Immature Granulocytes: 0.04 10*3/uL (ref 0.00–0.07)
Basophils Absolute: 0 10*3/uL (ref 0.0–0.1)
Basophils Relative: 0 %
Eosinophils Absolute: 0 10*3/uL (ref 0.0–0.5)
Eosinophils Relative: 0 %
HCT: 35.9 % — ABNORMAL LOW (ref 36.0–46.0)
Hemoglobin: 12.3 g/dL (ref 12.0–15.0)
Immature Granulocytes: 1 %
Lymphocytes Relative: 14 %
Lymphs Abs: 1.2 10*3/uL (ref 0.7–4.0)
MCH: 31.5 pg (ref 26.0–34.0)
MCHC: 34.3 g/dL (ref 30.0–36.0)
MCV: 92.1 fL (ref 80.0–100.0)
Monocytes Absolute: 0.7 10*3/uL (ref 0.1–1.0)
Monocytes Relative: 8 %
Neutro Abs: 6.4 10*3/uL (ref 1.7–7.7)
Neutrophils Relative %: 77 %
Platelets: 166 10*3/uL (ref 150–400)
RBC: 3.9 MIL/uL (ref 3.87–5.11)
RDW: 13.4 % (ref 11.5–15.5)
WBC: 8.3 10*3/uL (ref 4.0–10.5)
nRBC: 0 % (ref 0.0–0.2)

## 2022-01-28 LAB — COMPREHENSIVE METABOLIC PANEL
ALT: 27 U/L (ref 0–44)
AST: 22 U/L (ref 15–41)
Albumin: 3.4 g/dL — ABNORMAL LOW (ref 3.5–5.0)
Alkaline Phosphatase: 54 U/L (ref 38–126)
Anion gap: 11 (ref 5–15)
BUN: 27 mg/dL — ABNORMAL HIGH (ref 8–23)
CO2: 23 mmol/L (ref 22–32)
Calcium: 9.3 mg/dL (ref 8.9–10.3)
Chloride: 103 mmol/L (ref 98–111)
Creatinine, Ser: 0.74 mg/dL (ref 0.44–1.00)
GFR, Estimated: >60 mL/min (ref >60)
Glucose, Bld: 204 mg/dL — ABNORMAL HIGH (ref 70–99)
Potassium: 3.8 mmol/L (ref 3.5–5.1)
Sodium: 137 mmol/L (ref 135–145)
Total Bilirubin: 0.6 mg/dL (ref 0.3–1.2)
Total Protein: 6.7 g/dL (ref 6.5–8.1)

## 2022-01-28 LAB — GLUCOSE, CAPILLARY
Glucose-Capillary: 199 mg/dL — ABNORMAL HIGH (ref 70–99)
Glucose-Capillary: 327 mg/dL — ABNORMAL HIGH (ref 70–99)

## 2022-01-28 MED ORDER — METFORMIN HCL 500 MG PO TABS: 500.0000 mg | ORAL_TABLET | Freq: Every day | ORAL | Status: DC

## 2022-01-28 MED ORDER — PREDNISONE 50 MG PO TABS: ORAL_TABLET | ORAL | 0 refills | Status: DC

## 2022-01-28 MED ORDER — GUAIFENESIN-DM 100-10 MG/5ML PO SYRP: 10.0000 mL | ORAL_SOLUTION | ORAL | 0 refills | Status: DC | PRN

## 2022-01-28 NOTE — Discharge Summary (Signed)
Physician Discharge Summary  Briana Foster [id number] DOB: [birth year] DOA: 01/26/2022  PCP: Wenda Low, MD  Admit date: 01/26/2022 Discharge date: 01/28/2022  Admitted From: home Discharge disposition: home   Recommendations for Outpatient Follow-Up:   Recheck hgbA1c and consider starting medication Patient is on Cardizem and norvasc-- consider changing   Discharge Diagnosis:   Principal Problem:   COVID-19 virus infection Active Problems:   Essential hypertension   Type 2 diabetes mellitus without complication, without long-term current use of insulin (HCC)   Persistent atrial fibrillation (HCC)   Restless leg syndrome   Hyperlipidemia   Chronic diastolic CHF (congestive heart failure) ([facility])    Discharge Condition: Improved.  Diet recommendation: Low sodium, heart healthy.  Carbohydrate-modified.  Wound care: None.  Code status: Full.   History of Present Illness:   Briana Foster is a 76 y.o. female with medical history significant of CAD, HTN, afib, RA, and DM presenting with generalized weakness.  The patient reports that she started with URI-type symptoms yesterday.  She went to get out of bed today and slid to the floor and then was unable to get up.  She has had cough, myalgias, SOB.  She is not on home O2 and EMS reported hypoxia and started her on [place] O2.  Her daughter who lives with her is also sick.    Hospital Course by Problem:   COVID-19 Infection -Patient with presenting with SOB and reported hypoxia at home; also with cough productive of clear sputum -She does not have a usual home O2 requirement and is currently requiring 2L  O2 (although no hypoxia documented here) -COVID POSITIVE -The patient has comorbidities which may increase the risk for ARDS/MODS including: age, HTN, DM, CAD -Pertinent labs concerning for COVID include normal CBC; increased LDH; markedly elevated D-dimer (>1); low procalcitonin; mildly elevated CRP   -CXR unremarkable -Will not treat with broad-spectrum antibiotics given procalcitonin <0.5 - steroids and Remdesivir (pharmacy consult) given +COVID test and hypoxia <94% on room air; she is not a candidate for Paxlovid due to medication interactions -Will attempt to maintain euvolemia to a net negative fluid status -Encourage mobilization/ambulation as much as possible -weaned to RA   DM -Recent A1c was 8.9, poorly controlled -She does not appear to be on medications for DM-- defer to PCP  HTN -Continue diltiazem and norvasc -defer to PCP to change regimen   HLD -Continue rosuvastatin   CAD -No current report of CP -s/p TAVR   Chronic diastolic CHF -Echo in [id number] with preserved EF  -Has vascular congestion on CXR and was given Lasix x 1 in the ER -Continue digoxin   Afib -Rate controlled with digoxin, diltiazem -Continue Eliquis   RLS -Continue Requip    Medical Consultants:    none  Discharge Exam:   Vitals:   01/28/22 0623 01/28/22 0750  BP: 133/87 (!) 158/83  Pulse: 63 64  Resp: 17 16  Temp: 98.2 F (36.8 C) 97.7 F (36.5 C)  SpO2: (!) 9% 95%   Vitals:   01/27/22 1640 01/27/22 2134 01/28/22 0623 01/28/22 0750  BP: (!) 140/76 (!) 134/101 133/87 (!) 158/83  Pulse: 61 63 63 64  Resp: 18 18 17 16   Temp: 98.1 F (36.7 C) 98 F (36.7 C) 98.2 F (36.8 C) 97.7 F (36.5 C)  TempSrc: Oral Oral Oral Oral  SpO2: 95% 96% (!) 9% 95%  Weight:      Height:  General exam: Appears calm and comfortable.off O2   The results of significant diagnostics from this hospitalization (including imaging, microbiology, ancillary and laboratory) are listed below for reference.     Procedures and Diagnostic Studies:   CT Head Wo Contrast  Result Date: 01/26/2022 CLINICAL DATA:  Head trauma, minor (Age >= 65y) EXAM: CT HEAD WITHOUT CONTRAST TECHNIQUE: Contiguous axial images were obtained from the base of the skull through the vertex without intravenous  contrast. RADIATION DOSE REDUCTION: This exam was performed according to the departmental dose-optimization program which includes automated exposure control, adjustment of the mA and/or kV according to patient size and/or use of iterative reconstruction technique. COMPARISON:  CT head 08/31/2021. FINDINGS: Brain: No evidence of acute infarction, hemorrhage, hydrocephalus, extra-axial collection or mass lesion/mass effect. Cerebral atrophy. Vascular: No hyperdense vessel identified. Skull: No acute fracture. Sinuses/Orbits: Clear sinuses. Other: No mastoid effusions. IMPRESSION: No evidence of acute intracranial abnormality. Electronically Signed   By: Margaretha Sheffield M.D.   On: 01/26/2022 08:42   DG Chest 2 View  Result Date: 01/26/2022 CLINICAL DATA:  76 year old female with history of shortness of breath, weakness and cough. EXAM: CHEST - 2 VIEW COMPARISON:  Chest x-ray 11/17/2021. FINDINGS: Lung volumes are slightly low. No consolidative airspace disease. No pleural effusions. No pneumothorax. Cephalization of the pulmonary vasculature, without frank pulmonary edema. Heart size is mildly enlarged. Upper mediastinal contours are within normal limits. Atherosclerotic calcifications are noted in the thoracic aorta. Left-sided pacemaker device in place with lead tip projecting over the expected location of the right ventricle. Status post TAVR. Severe calcifications of the mitral annulus again noted. IMPRESSION: 1. Cardiomegaly with pulmonary venous congestion, but no frank pulmonary edema. 2. Aortic atherosclerosis. Electronically Signed   By: Vinnie Langton M.D.   On: 01/26/2022 06:47     Labs:   Basic Metabolic Panel: Recent Labs  Lab 01/26/22 0605 01/27/22 0146 01/28/22 0651  NA 137 137 137  K 3.7 4.3 3.8  CL 106 106 103  CO2 21* 23 23  GLUCOSE 239* 198* 204*  BUN 10 13 27*  CREATININE 0.96 0.78 0.74  CALCIUM 9.0 8.8* 9.3   GFR Estimated Creatinine Clearance: 68.9 mL/min (by C-G  formula based on SCr of 0.74 mg/dL). Liver Function Tests: Recent Labs  Lab 01/26/22 0805 01/27/22 0146 01/28/22 0651  AST 31 24 22   ALT 30 27 27   ALKPHOS 65 57 54  BILITOT 0.9 0.8 0.6  PROT 6.8 6.5 6.7  ALBUMIN 3.8 3.4* 3.4*   No results for input(s): "LIPASE", "AMYLASE" in the last 168 hours. No results for input(s): "AMMONIA" in the last 168 hours. Coagulation profile Recent Labs  Lab 01/26/22 0805  INR 1.2    CBC: Recent Labs  Lab 01/26/22 0605 01/27/22 0146 01/28/22 0651  WBC 10.2 5.0 8.3  NEUTROABS  --  4.2 6.4  HGB 12.1 11.4* 12.3  HCT 36.6 35.1* 35.9*  MCV 93.4 94.6 92.1  PLT 178 152 166   Cardiac Enzymes: No results for input(s): "CKTOTAL", "CKMB", "CKMBINDEX", "TROPONINI" in the last 168 hours. BNP: Invalid input(s): "POCBNP" CBG: Recent Labs  Lab 01/27/22 0841 01/27/22 1203 01/27/22 1637 01/27/22 2131 01/28/22 0708  GLUCAP 197* 270* 290* 231* 199*   D-Dimer Recent Labs    01/26/22 1500  DDIMER 1.12*   Hgb A1c No results for input(s): "HGBA1C" in the last 72 hours. Lipid Profile No results for input(s): "CHOL", "HDL", "LDLCALC", "TRIG", "CHOLHDL", "LDLDIRECT" in the last 72 hours. Thyroid function studies No results  for input(s): "TSH", "T4TOTAL", "T3FREE", "THYROIDAB" in the last 72 hours.  Invalid input(s): "FREET3" Anemia work up Recent Labs    01/26/22 1500  FERRITIN 111   Microbiology Recent Results (from the past 240 hour(s))  Resp panel by RT-PCR (RSV, Flu A&B, Covid) Anterior Nasal Swab     Status: Abnormal   Collection Time: 01/26/22  7:35 AM   Specimen: Anterior Nasal Swab  Result Value Ref Range Status   SARS Coronavirus 2 by RT PCR POSITIVE (A) NEGATIVE Final    Comment: (NOTE) SARS-CoV-2 target nucleic acids are DETECTED.  The SARS-CoV-2 RNA is generally detectable in upper respiratory specimens during the acute phase of infection. Positive results are indicative of the presence of the identified virus, but do  not rule out bacterial infection or co-infection with other pathogens not detected by the test. Clinical correlation with patient history and other diagnostic information is necessary to determine patient infection status. The expected result is Negative.  Fact Sheet for Patients: EntrepreneurPulse.com.au  Fact Sheet for Healthcare Providers: IncredibleEmployment.be  This test is not yet approved or cleared by the [place] FDA and  has been authorized for detection and/or diagnosis of SARS-CoV-2 by FDA under an Emergency Use Authorization (EUA).  This EUA will remain in effect (meaning this test can be used) for the duration of  the COVID-19 declaration under Section 564(b)(1) of the A ct, 21 U.S.C. section 360bbb-3(b)(1), unless the authorization is terminated or revoked sooner.     Influenza A by PCR NEGATIVE NEGATIVE Final   Influenza B by PCR NEGATIVE NEGATIVE Final    Comment: (NOTE) The Xpert Xpress SARS-CoV-2/FLU/RSV plus assay is intended as an aid in the diagnosis of influenza from Nasopharyngeal swab specimens and should not be used as a sole basis for treatment. Nasal washings and aspirates are unacceptable for Xpert Xpress SARS-CoV-2/FLU/RSV testing.  Fact Sheet for Patients: EntrepreneurPulse.com.au  Fact Sheet for Healthcare Providers: IncredibleEmployment.be  This test is not yet approved or cleared by the [place] FDA and has been authorized for detection and/or diagnosis of SARS-CoV-2 by FDA under an Emergency Use Authorization (EUA). This EUA will remain in effect (meaning this test can be used) for the duration of the COVID-19 declaration under Section 564(b)(1) of the Act, 21 U.S.C. section 360bbb-3(b)(1), unless the authorization is terminated or revoked.     Resp Syncytial Virus by PCR NEGATIVE NEGATIVE Final    Comment: (NOTE) Fact Sheet for  Patients: EntrepreneurPulse.com.au  Fact Sheet for Healthcare Providers: IncredibleEmployment.be  This test is not yet approved or cleared by the [place] FDA and has been authorized for detection and/or diagnosis of SARS-CoV-2 by FDA under an Emergency Use Authorization (EUA). This EUA will remain in effect (meaning this test can be used) for the duration of the COVID-19 declaration under Section 564(b)(1) of the Act, 21 U.S.C. section 360bbb-3(b)(1), unless the authorization is terminated or revoked.  Performed at [facility], [place] [address], [place], [place] [address]   Blood Culture (routine x 2)     Status: None (Preliminary result)   Collection Time: 01/26/22  7:36 AM   Specimen: BLOOD  Result Value Ref Range Status   Specimen Description BLOOD  Final   Special Requests   Final    BOTTLES DRAWN AEROBIC AND ANAEROBIC Blood Culture adequate volume   Culture   Final    NO GROWTH 2 DAYS Performed at [facility], [place] [address] [place], [place] [address]  Report Status PENDING  Incomplete  Blood Culture (routine x 2)     Status: None (Preliminary result)   Collection Time: 01/26/22  7:41 AM   Specimen: BLOOD  Result Value Ref Range Status   Specimen Description BLOOD RIGHT ANTECUBITAL  Final   Special Requests   Final    BOTTLES DRAWN AEROBIC AND ANAEROBIC Blood Culture adequate volume   Culture   Final    NO GROWTH 2 DAYS Performed at [facility], [address] [place], [place] [address]    Report Status PENDING  Incomplete  Urine Culture     Status: Abnormal   Collection Time: 01/26/22 11:55 AM   Specimen: In/Out Cath Urine  Result Value Ref Range Status   Specimen Description IN/OUT CATH URINE  Final   Special Requests   Final    NONE Performed at [facility], [place] [address], [place],  [address]    Culture MULTIPLE SPECIES PRESENT, SUGGEST RECOLLECTION (A)  Final   Report Status  01/27/2022 FINAL  Final     Discharge Instructions:   Discharge Instructions     Diet - low sodium heart healthy   Complete by: As directed    Diet Carb Modified   Complete by: As directed    Discharge instructions   Complete by: As directed    Your hgbA1c has been elevated-- will need to follow a card mod diet and have close follow up with your PCP to consider starting medications-- this is especially important while on steroids   Increase activity slowly   Complete by: As directed       Allergies as of 01/28/2022       Reactions   Bee Venom Shortness Of Breath, Rash   Claritin [loratadine] Itching, Palpitations   Wasp Venom Other (See Comments), Anaphylaxis   Apricot Flavor Swelling   Eyes swell shut   Atorvastatin Itching   Eye swelling Other reaction(s): HA        Medication List     STOP taking these medications    rosuvastatin 20 MG tablet Commonly known as: CRESTOR       TAKE these medications    acetaminophen 500 MG tablet Commonly known as: TYLENOL Take 500-1,000 mg by mouth every 8 (eight) hours as needed for moderate pain.   albuterol 108 (90 Base) MCG/ACT inhaler Commonly known as: VENTOLIN HFA Inhale 2 puffs into the lungs every 4 (four) hours as needed for wheezing or shortness of breath.   amLODipine 10 MG tablet Commonly known as: NORVASC Take 1 tablet (10 mg total) by mouth daily.   apixaban 5 MG Tabs tablet Commonly known as: Eliquis Take 1 tablet (5 mg total) by mouth 2 (two) times daily.   benzonatate 200 MG capsule Commonly known as: TESSALON Take 200 mg by mouth 3 (three) times daily as needed for cough.   cetirizine 10 MG tablet Commonly known as: ZYRTEC Take 10 mg by mouth daily as needed for allergies.   diclofenac Sodium 1 % Gel Commonly known as: VOLTAREN Apply 2 g topically 4 (four) times daily.   digoxin 0.125 MG tablet Commonly known as: LANOXIN Take 1 tablet (0.125 mg total) by mouth daily.   diltiazem 180  MG 24 hr capsule Commonly known as: DILACOR XR Take 180 mg by mouth daily.   docusate sodium 100 MG capsule Commonly known as: COLACE Take 1 capsule (100 mg total) by mouth 2 (two) times daily. What changed:  when to  take this reasons to take this   guaiFENesin-dextromethorphan 100-10 MG/5ML syrup Commonly known as: ROBITUSSIN DM Take 10 mLs by mouth every 4 (four) hours as needed for cough.   meclizine 25 MG tablet Commonly known as: ANTIVERT Take 25 mg by mouth 2 (two) times daily as needed (vertigo).   multivitamin with minerals tablet Take 1 tablet by mouth daily.   predniSONE 50 MG tablet Commonly known as: DELTASONE Take daily x 3 days Start taking on: January 29, 2022   rOPINIRole 4 MG tablet Commonly known as: REQUIP Take 4 mg by mouth at bedtime.   VITAMIN B-12 PO Take 1 tablet by mouth daily.               Durable Medical Equipment  (From admission, onward)           Start     Ordered   01/27/22 1341  For home use only DME 3 n 1  Once        01/27/22 1340            Follow-up Information     [facility] Follow up.   Specialty: Home Health Services Contact information: [address] [place] [address] [phone]                  Time coordinating discharge: 45  Signed:  Joseph Art DO  [facility] 01/28/2022, 11:01 AM

## 2022-01-31 ENCOUNTER — Emergency Department (HOSPITAL_COMMUNITY): Payer: Medicare HMO

## 2022-01-31 ENCOUNTER — Inpatient Hospital Stay (HOSPITAL_COMMUNITY)
Admission: EM | Admit: 2022-01-31 | Discharge: 2022-02-03 | DRG: 690 | Disposition: A | Discharge status: Home Health | Payer: Medicare HMO | Attending: Internal Medicine | Admitting: Internal Medicine

## 2022-01-31 ENCOUNTER — Other Ambulatory Visit: Payer: Self-pay

## 2022-01-31 ENCOUNTER — Encounter (HOSPITAL_COMMUNITY): Payer: Self-pay | Admitting: Emergency Medicine

## 2022-01-31 DIAGNOSIS — I4819 Other persistent atrial fibrillation: Secondary | ICD-10-CM | POA: Diagnosis not present

## 2022-01-31 DIAGNOSIS — R531 Weakness: Secondary | ICD-10-CM

## 2022-01-31 DIAGNOSIS — N10 Acute tubulo-interstitial nephritis: Secondary | ICD-10-CM | POA: Diagnosis not present

## 2022-01-31 DIAGNOSIS — M069 Rheumatoid arthritis, unspecified: Secondary | ICD-10-CM | POA: Diagnosis not present

## 2022-01-31 DIAGNOSIS — I959 Hypotension, unspecified: Secondary | ICD-10-CM | POA: Diagnosis not present

## 2022-01-31 DIAGNOSIS — Z8249 Family history of ischemic heart disease and other diseases of the circulatory system: Secondary | ICD-10-CM | POA: Diagnosis not present

## 2022-01-31 DIAGNOSIS — E860 Dehydration: Secondary | ICD-10-CM | POA: Diagnosis not present

## 2022-01-31 DIAGNOSIS — N12 Tubulo-interstitial nephritis, not specified as acute or chronic: Principal | ICD-10-CM

## 2022-01-31 DIAGNOSIS — I2510 Atherosclerotic heart disease of native coronary artery without angina pectoris: Secondary | ICD-10-CM | POA: Diagnosis present

## 2022-01-31 DIAGNOSIS — E871 Hypo-osmolality and hyponatremia: Secondary | ICD-10-CM | POA: Diagnosis present

## 2022-01-31 DIAGNOSIS — Z952 Presence of prosthetic heart valve: Secondary | ICD-10-CM

## 2022-01-31 DIAGNOSIS — G2581 Restless legs syndrome: Secondary | ICD-10-CM | POA: Diagnosis present

## 2022-01-31 DIAGNOSIS — M170 Bilateral primary osteoarthritis of knee: Secondary | ICD-10-CM | POA: Diagnosis present

## 2022-01-31 DIAGNOSIS — Z8679 Personal history of other diseases of the circulatory system: Secondary | ICD-10-CM

## 2022-01-31 DIAGNOSIS — I5032 Chronic diastolic (congestive) heart failure: Secondary | ICD-10-CM | POA: Diagnosis present

## 2022-01-31 DIAGNOSIS — I110 Hypertensive heart disease with heart failure: Secondary | ICD-10-CM | POA: Diagnosis present

## 2022-01-31 DIAGNOSIS — E1165 Type 2 diabetes mellitus with hyperglycemia: Secondary | ICD-10-CM | POA: Diagnosis not present

## 2022-01-31 DIAGNOSIS — Z8616 Personal history of COVID-19: Secondary | ICD-10-CM | POA: Diagnosis not present

## 2022-01-31 DIAGNOSIS — Z91018 Allergy to other foods: Secondary | ICD-10-CM

## 2022-01-31 DIAGNOSIS — Z7952 Long term (current) use of systemic steroids: Secondary | ICD-10-CM

## 2022-01-31 DIAGNOSIS — U071 COVID-19: Secondary | ICD-10-CM | POA: Diagnosis not present

## 2022-01-31 DIAGNOSIS — K219 Gastro-esophageal reflux disease without esophagitis: Secondary | ICD-10-CM | POA: Diagnosis present

## 2022-01-31 DIAGNOSIS — I350 Nonrheumatic aortic (valve) stenosis: Secondary | ICD-10-CM | POA: Diagnosis present

## 2022-01-31 DIAGNOSIS — I10 Essential (primary) hypertension: Secondary | ICD-10-CM | POA: Diagnosis not present

## 2022-01-31 DIAGNOSIS — N134 Hydroureter: Secondary | ICD-10-CM | POA: Diagnosis not present

## 2022-01-31 DIAGNOSIS — E785 Hyperlipidemia, unspecified: Secondary | ICD-10-CM | POA: Diagnosis present

## 2022-01-31 DIAGNOSIS — J1282 Pneumonia due to coronavirus disease 2019: Secondary | ICD-10-CM | POA: Diagnosis not present

## 2022-01-31 DIAGNOSIS — Z794 Long term (current) use of insulin: Secondary | ICD-10-CM

## 2022-01-31 DIAGNOSIS — D72829 Elevated white blood cell count, unspecified: Secondary | ICD-10-CM | POA: Diagnosis not present

## 2022-01-31 DIAGNOSIS — N200 Calculus of kidney: Secondary | ICD-10-CM | POA: Diagnosis present

## 2022-01-31 DIAGNOSIS — E861 Hypovolemia: Secondary | ICD-10-CM | POA: Diagnosis present

## 2022-01-31 DIAGNOSIS — Z7901 Long term (current) use of anticoagulants: Secondary | ICD-10-CM

## 2022-01-31 DIAGNOSIS — F32A Depression, unspecified: Secondary | ICD-10-CM | POA: Diagnosis present

## 2022-01-31 DIAGNOSIS — N179 Acute kidney failure, unspecified: Secondary | ICD-10-CM | POA: Diagnosis not present

## 2022-01-31 DIAGNOSIS — Z823 Family history of stroke: Secondary | ICD-10-CM

## 2022-01-31 DIAGNOSIS — Z888 Allergy status to other drugs, medicaments and biological substances status: Secondary | ICD-10-CM

## 2022-01-31 DIAGNOSIS — Z91030 Bee allergy status: Secondary | ICD-10-CM

## 2022-01-31 DIAGNOSIS — Z79899 Other long term (current) drug therapy: Secondary | ICD-10-CM

## 2022-01-31 DIAGNOSIS — N1330 Unspecified hydronephrosis: Secondary | ICD-10-CM | POA: Diagnosis not present

## 2022-01-31 LAB — COMPREHENSIVE METABOLIC PANEL
ALT: 27 U/L (ref 0–44)
AST: 22 U/L (ref 15–41)
Albumin: 3.8 g/dL (ref 3.5–5.0)
Alkaline Phosphatase: 67 U/L (ref 38–126)
Anion gap: 14 (ref 5–15)
BUN: 26 mg/dL — ABNORMAL HIGH (ref 8–23)
CO2: 22 mmol/L (ref 22–32)
Calcium: 9.1 mg/dL (ref 8.9–10.3)
Chloride: 95 mmol/L — ABNORMAL LOW (ref 98–111)
Creatinine, Ser: 1.41 mg/dL — ABNORMAL HIGH (ref 0.44–1.00)
GFR, Estimated: 39 mL/min — ABNORMAL LOW (ref >60)
Glucose, Bld: 336 mg/dL — ABNORMAL HIGH (ref 70–99)
Potassium: 4 mmol/L (ref 3.5–5.1)
Sodium: 131 mmol/L — ABNORMAL LOW (ref 135–145)
Total Bilirubin: 0.6 mg/dL (ref 0.3–1.2)
Total Protein: 7.1 g/dL (ref 6.5–8.1)

## 2022-01-31 LAB — I-STAT CHEM 8, ED
BUN: 32 mg/dL — ABNORMAL HIGH (ref 8–23)
Calcium, Ion: 0.97 mmol/L — ABNORMAL LOW (ref 1.15–1.40)
Chloride: 99 mmol/L (ref 98–111)
Creatinine, Ser: 1.2 mg/dL — ABNORMAL HIGH (ref 0.44–1.00)
Glucose, Bld: 325 mg/dL — ABNORMAL HIGH (ref 70–99)
HCT: 49 % — ABNORMAL HIGH (ref 36.0–46.0)
Hemoglobin: 16.7 g/dL — ABNORMAL HIGH (ref 12.0–15.0)
Potassium: 4.1 mmol/L (ref 3.5–5.1)
Sodium: 132 mmol/L — ABNORMAL LOW (ref 135–145)
TCO2: 24 mmol/L (ref 22–32)

## 2022-01-31 LAB — CULTURE, BLOOD (ROUTINE X 2)
Culture: NO GROWTH
Culture: NO GROWTH
Special Requests: ADEQUATE
Special Requests: ADEQUATE

## 2022-01-31 LAB — CBC WITH DIFFERENTIAL/PLATELET
Abs Immature Granulocytes: 0.11 10*3/uL — ABNORMAL HIGH (ref 0.00–0.07)
Basophils Absolute: 0.1 10*3/uL (ref 0.0–0.1)
Basophils Relative: 0 %
Eosinophils Absolute: 0.1 10*3/uL (ref 0.0–0.5)
Eosinophils Relative: 0 %
HCT: 46.2 % — ABNORMAL HIGH (ref 36.0–46.0)
Hemoglobin: 16.4 g/dL — ABNORMAL HIGH (ref 12.0–15.0)
Immature Granulocytes: 1 %
Lymphocytes Relative: 13 %
Lymphs Abs: 2.2 10*3/uL (ref 0.7–4.0)
MCH: 31.8 pg (ref 26.0–34.0)
MCHC: 35.5 g/dL (ref 30.0–36.0)
MCV: 89.5 fL (ref 80.0–100.0)
Monocytes Absolute: 1.6 10*3/uL — ABNORMAL HIGH (ref 0.1–1.0)
Monocytes Relative: 10 %
Neutro Abs: 12.4 10*3/uL — ABNORMAL HIGH (ref 1.7–7.7)
Neutrophils Relative %: 76 %
Platelets: 240 10*3/uL (ref 150–400)
RBC: 5.16 MIL/uL — ABNORMAL HIGH (ref 3.87–5.11)
RDW: 12.9 % (ref 11.5–15.5)
WBC: 16.4 10*3/uL — ABNORMAL HIGH (ref 4.0–10.5)
nRBC: 0 % (ref 0.0–0.2)

## 2022-01-31 LAB — LIPASE, BLOOD: Lipase: 78 U/L — ABNORMAL HIGH (ref 11–51)

## 2022-01-31 LAB — LACTIC ACID, PLASMA: Lactic Acid, Venous: 2.2 mmol/L (ref 0.5–1.9)

## 2022-01-31 MED ORDER — SODIUM CHLORIDE 0.9 % IV BOLUS
500.0000 mL | Freq: Once | INTRAVENOUS | Status: AC
  Administered 2022-01-31: 500 mL via INTRAVENOUS

## 2022-01-31 MED ORDER — PANTOPRAZOLE SODIUM 40 MG IV SOLR
40.0000 mg | Freq: Once | INTRAVENOUS | Status: AC
  Administered 2022-02-01: 40 mg via INTRAVENOUS
  Filled 2022-01-31: qty 10

## 2022-01-31 MED ORDER — FENTANYL CITRATE PF 50 MCG/ML IJ SOSY
50.0000 ug | PREFILLED_SYRINGE | Freq: Once | INTRAMUSCULAR | Status: AC
  Administered 2022-02-01: 50 ug via INTRAVENOUS
  Filled 2022-01-31: qty 1

## 2022-01-31 NOTE — ED Provider Triage Note (Signed)
Emergency Medicine Provider Triage Evaluation Note  Briana Foster , a 77 y.o. female  was evaluated in triage.  Pt complains of abdominal pain. She states that same began this morning. Was just diagnosed with COVID a few days ago. Denies nausea, vomiting, or diarrhea. No hematuria or dysuria. According to the patients daughter Briana Foster present in the room, patient has been rolling around in bed all day due to this pain. She states that she has CHF and is always somewhat short of breath and does not feel any more short of breath than normal. She denies chest pain.   Review of Systems  Positive:  Negative:   Physical Exam  BP (!) 86/54   Pulse (!) 55   Temp 97.8 F (36.6 C) (Oral)   Resp (!) 22   Ht 5\' 8"  (1.727 m)   Wt 81 kg   SpO2 95%   BMI 27.15 kg/m  Gen:   Awake, no distress  obviously uncomfortable Resp:  Normal effort  MSK:   Moves extremities without difficulty  Other:  RLQ tenderness to palpation  Medical Decision Making  Medically screening exam initiated at 9:55 PM.  Appropriate orders placed.  Briana Foster was informed that the remainder of the evaluation will be completed by another provider, this initial triage assessment does not replace that evaluation, and the importance of remaining in the ED until their evaluation is complete.  Patient hypotensive in triage, fluids started and istat ordered to expedite CT. Staff aware patient needs next available room   Nestor Lewandowsky 01/31/22 2202

## 2022-01-31 NOTE — ED Provider Notes (Signed)
[facility] EMERGENCY DEPARTMENT Provider Note   CSN: [id number] Arrival date & time: 01/31/22  2107     History {Add pertinent medical, surgical, social history, OB history to HPI:1} Chief Complaint  Patient presents with   Abdominal Pain   Hypotension    Briana Foster is a 77 y.o. female.  77 y.o. female with medical history significant of CAD, HTN, Afib (on Eliquis), aortic stenosis s/p TAVR, AV node ablation and PPM, and DM presents to the ED for c/o R sided abdominal pain. She has had similar pain in the past which she presented to the ED for x 2 in October. Initially admitted with thoughts of acute cholecystitis which was later ruled out by MRCP. Subsequently felt to have an obstructing kidney stone; however, this was managed conservatively. Developed COVID on 01/26/22 and was admitted for respiratory failure and new O2 requirement.   Abdominal pain began to recur today. She states that pain is constant, waxing/waning in severity. Per daughter, patient has been rolling around in the bed all day. She has associated nausea with emesis x 3. No dysuria, hematuria, diarrhea, fever, chest pain, new/worsening SOB.   Abdominal Pain      Home Medications Prior to Admission medications   Medication Sig Start Date End Date Taking? Authorizing Provider  acetaminophen (TYLENOL) 500 MG tablet Take 500-1,000 mg by mouth every 8 (eight) hours as needed for moderate pain.    [provider]  albuterol (VENTOLIN HFA) 108 (90 Base) MCG/ACT inhaler Inhale 2 puffs into the lungs every 4 (four) hours as needed for wheezing or shortness of breath.    [provider]  amLODipine (NORVASC) 10 MG tablet Take 1 tablet (10 mg total) by mouth daily. 11/03/21   Gaston Islam., NP  apixaban (ELIQUIS) 5 MG TABS tablet Take 1 tablet (5 mg total) by mouth 2 (two) times daily. 06/18/21   Lyn Records, MD  benzonatate (TESSALON) 200 MG capsule Take 200 mg by mouth 3  (three) times daily as needed for cough. 11/12/21   [provider]  cetirizine (ZYRTEC) 10 MG tablet Take 10 mg by mouth daily as needed for allergies.    [provider]  Cyanocobalamin (VITAMIN B-12 PO) Take 1 tablet by mouth daily.    [provider]  diclofenac Sodium (VOLTAREN) 1 % GEL Apply 2 g topically 4 (four) times daily. 11/09/21   Amin, Loura Halt, MD  digoxin (LANOXIN) 0.125 MG tablet Take 1 tablet (0.125 mg total) by mouth daily. 06/11/21   Janetta Hora, PA-C  diltiazem (DILACOR XR) 180 MG 24 hr capsule Take 180 mg by mouth daily.    [provider]  docusate sodium (COLACE) 100 MG capsule Take 1 capsule (100 mg total) by mouth 2 (two) times daily. Patient taking differently: Take 100 mg by mouth daily as needed for mild constipation. 11/09/21   Amin, [name], MD  guaiFENesin-dextromethorphan (ROBITUSSIN DM) 100-10 MG/5ML syrup Take 10 mLs by mouth every 4 (four) hours as needed for cough. 01/28/22   Joseph Art, DO  meclizine (ANTIVERT) 25 MG tablet Take 25 mg by mouth 2 (two) times daily as needed (vertigo). 11/12/20   [provider]  Multiple Vitamins-Minerals (MULTIVITAMIN WITH MINERALS) tablet Take 1 tablet by mouth daily.    [provider]  predniSONE (DELTASONE) 50 MG tablet Take daily x 3 days 01/29/22   Marlin Canary U, DO  rOPINIRole (REQUIP) 4 MG tablet Take 4 mg  by mouth at bedtime.    [provider]      Allergies    Bee venom, Claritin [loratadine], Wasp venom, Apricot flavor, and Atorvastatin    Review of Systems   Review of Systems  Gastrointestinal:  Positive for abdominal pain.  Ten systems reviewed and are negative for acute change, except as noted in the HPI.    Physical Exam Updated Vital Signs BP 139/65   Pulse 62   Temp 97.8 F (36.6 C) (Oral)   Resp 11   Ht 5\' 8"  (1.727 m)   Wt 81 kg   SpO2 99%   BMI 27.15 kg/m   Physical Exam Vitals and nursing note  reviewed.  Constitutional:      General: She is not in acute distress.    Appearance: She is well-developed. She is not diaphoretic.     Comments: Chronically ill appearing  HENT:     Head: Normocephalic and atraumatic.  Eyes:     General: No scleral icterus.    Conjunctiva/sclera: Conjunctivae normal.  Cardiovascular:     Rate and Rhythm: Normal rate. Rhythm irregular.  Pulmonary:     Effort: Pulmonary effort is normal. No respiratory distress.     Breath sounds: No stridor. Wheezing (mild, expiratory, diffuse) present.  Abdominal:     Comments: TTP in the RUQ with guarding. Abdomen soft, obese. No peritoneal signs.  Musculoskeletal:        General: Normal range of motion.     Cervical back: Normal range of motion.  Skin:    General: Skin is warm and dry.     Coloration: Skin is not pale.     Findings: No erythema or rash.  Neurological:     Mental Status: She is alert and oriented to person, place, and time.     Coordination: Coordination normal.  Psychiatric:        Behavior: Behavior normal.     ED Results / Procedures / Treatments   Labs (all labs ordered are listed, but only abnormal results are displayed) Labs Reviewed  COMPREHENSIVE METABOLIC PANEL - Abnormal; Notable for the following components:      Result Value   Sodium 131 (*)    Chloride 95 (*)    Glucose, Bld 336 (*)    BUN 26 (*)    Creatinine, Ser 1.41 (*)    GFR, Estimated 39 (*)    All other components within normal limits  LIPASE, BLOOD - Abnormal; Notable for the following components:   Lipase 78 (*)    All other components within normal limits  CBC WITH DIFFERENTIAL/PLATELET - Abnormal; Notable for the following components:   WBC 16.4 (*)    RBC 5.16 (*)    Hemoglobin 16.4 (*)    HCT 46.2 (*)    Neutro Abs 12.4 (*)    Monocytes Absolute 1.6 (*)    Abs Immature Granulocytes 0.11 (*)    All other components within normal limits  LACTIC ACID, PLASMA - Abnormal; Notable for the following  components:   Lactic Acid, Venous 2.2 (*)    All other components within normal limits  I-STAT CHEM 8, ED - Abnormal; Notable for the following components:   Sodium 132 (*)    BUN 32 (*)    Creatinine, Ser 1.20 (*)    Glucose, Bld 325 (*)    Calcium, Ion 0.97 (*)    Hemoglobin 16.7 (*)    HCT 49.0 (*)    All other components within normal  limits  URINALYSIS, ROUTINE W REFLEX MICROSCOPIC  LACTIC ACID, PLASMA    EKG None  Radiology DG Chest Portable 1 View  Result Date: 01/31/2022 CLINICAL DATA:  Cough patient was diagnosed with COVID last Wednesday. EXAM: PORTABLE CHEST 1 VIEW COMPARISON:  Chest radiograph dated January 26, 2022 FINDINGS: The heart size and mediastinal contours are within normal limits. Trans aortic valve replacement. Left access pacemaker with leads in the right ventricle. Both lungs are clear. The visualized skeletal structures are unremarkable. IMPRESSION: No active disease. Electronically Signed   By: Keane Police D.O.   On: 01/31/2022 22:16    Procedures Procedures  {Document cardiac monitor, telemetry assessment procedure when appropriate:1}  Medications Ordered in ED Medications  fentaNYL (SUBLIMAZE) injection 50 mcg (has no administration in time range)  pantoprazole (PROTONIX) injection 40 mg (has no administration in time range)  sodium chloride 0.9 % bolus 500 mL (500 mLs Intravenous New Bag/Given 01/31/22 2216)    ED Course/ Medical Decision Making/ A&P                           Medical Decision Making Amount and/or Complexity of Data Reviewed ECG/medicine tests: ordered.  Risk Prescription drug management.   ***  {Document critical care time when appropriate:1} {Document review of labs and clinical decision tools ie heart score, Chads2Vasc2 etc:1}  {Document your independent review of radiology images, and any outside records:1} {Document your discussion with family members, caretakers, and with consultants:1} {Document social  determinants of health affecting pt's care:1} {Document your decision making why or why not admission, treatments were needed:1} Final Clinical Impression(s) / ED Diagnoses Final diagnoses:  None    Rx / DC Orders ED Discharge Orders     None

## 2022-01-31 NOTE — ED Triage Notes (Signed)
Per family pt was diagnosed w/ COVID last Wednesday.  Today her daughter reports she found her mother "rolling around in pain."  She c/o lower right abdominal pain.  She is hypotensive in triage, 86/54.

## 2022-01-31 NOTE — ED Triage Notes (Incomplete)
Pt was diagnosed on Wednesday w/ COVID.  This morning she w

## 2022-02-01 ENCOUNTER — Emergency Department (HOSPITAL_COMMUNITY): Payer: Medicare HMO

## 2022-02-01 DIAGNOSIS — N179 Acute kidney failure, unspecified: Secondary | ICD-10-CM | POA: Diagnosis present

## 2022-02-01 DIAGNOSIS — E1165 Type 2 diabetes mellitus with hyperglycemia: Secondary | ICD-10-CM

## 2022-02-01 DIAGNOSIS — Z7901 Long term (current) use of anticoagulants: Secondary | ICD-10-CM | POA: Diagnosis not present

## 2022-02-01 DIAGNOSIS — E860 Dehydration: Secondary | ICD-10-CM | POA: Diagnosis present

## 2022-02-01 DIAGNOSIS — I350 Nonrheumatic aortic (valve) stenosis: Secondary | ICD-10-CM | POA: Diagnosis present

## 2022-02-01 DIAGNOSIS — D72829 Elevated white blood cell count, unspecified: Secondary | ICD-10-CM

## 2022-02-01 DIAGNOSIS — Z952 Presence of prosthetic heart valve: Secondary | ICD-10-CM | POA: Diagnosis not present

## 2022-02-01 DIAGNOSIS — R531 Weakness: Secondary | ICD-10-CM

## 2022-02-01 DIAGNOSIS — E871 Hypo-osmolality and hyponatremia: Secondary | ICD-10-CM | POA: Diagnosis present

## 2022-02-01 DIAGNOSIS — Z8249 Family history of ischemic heart disease and other diseases of the circulatory system: Secondary | ICD-10-CM | POA: Diagnosis not present

## 2022-02-01 DIAGNOSIS — M170 Bilateral primary osteoarthritis of knee: Secondary | ICD-10-CM | POA: Diagnosis present

## 2022-02-01 DIAGNOSIS — I959 Hypotension, unspecified: Secondary | ICD-10-CM

## 2022-02-01 DIAGNOSIS — N200 Calculus of kidney: Secondary | ICD-10-CM | POA: Diagnosis present

## 2022-02-01 DIAGNOSIS — Z8616 Personal history of COVID-19: Secondary | ICD-10-CM | POA: Diagnosis not present

## 2022-02-01 DIAGNOSIS — Z8679 Personal history of other diseases of the circulatory system: Secondary | ICD-10-CM | POA: Diagnosis not present

## 2022-02-01 DIAGNOSIS — N10 Acute tubulo-interstitial nephritis: Secondary | ICD-10-CM

## 2022-02-01 DIAGNOSIS — I110 Hypertensive heart disease with heart failure: Secondary | ICD-10-CM | POA: Diagnosis present

## 2022-02-01 DIAGNOSIS — N1330 Unspecified hydronephrosis: Secondary | ICD-10-CM | POA: Diagnosis not present

## 2022-02-01 DIAGNOSIS — N134 Hydroureter: Secondary | ICD-10-CM | POA: Diagnosis not present

## 2022-02-01 DIAGNOSIS — Z950 Presence of cardiac pacemaker: Secondary | ICD-10-CM

## 2022-02-01 DIAGNOSIS — I5032 Chronic diastolic (congestive) heart failure: Secondary | ICD-10-CM | POA: Diagnosis present

## 2022-02-01 DIAGNOSIS — I4819 Other persistent atrial fibrillation: Secondary | ICD-10-CM | POA: Diagnosis present

## 2022-02-01 DIAGNOSIS — E861 Hypovolemia: Secondary | ICD-10-CM | POA: Diagnosis present

## 2022-02-01 DIAGNOSIS — M069 Rheumatoid arthritis, unspecified: Secondary | ICD-10-CM | POA: Diagnosis present

## 2022-02-01 DIAGNOSIS — Z794 Long term (current) use of insulin: Secondary | ICD-10-CM | POA: Diagnosis not present

## 2022-02-01 DIAGNOSIS — F32A Depression, unspecified: Secondary | ICD-10-CM | POA: Diagnosis present

## 2022-02-01 DIAGNOSIS — G2581 Restless legs syndrome: Secondary | ICD-10-CM | POA: Diagnosis present

## 2022-02-01 DIAGNOSIS — E785 Hyperlipidemia, unspecified: Secondary | ICD-10-CM | POA: Diagnosis present

## 2022-02-01 DIAGNOSIS — I2510 Atherosclerotic heart disease of native coronary artery without angina pectoris: Secondary | ICD-10-CM | POA: Diagnosis present

## 2022-02-01 DIAGNOSIS — Z79899 Other long term (current) drug therapy: Secondary | ICD-10-CM | POA: Diagnosis not present

## 2022-02-01 LAB — COMPREHENSIVE METABOLIC PANEL
ALT: 19 U/L (ref 0–44)
AST: 13 U/L — ABNORMAL LOW (ref 15–41)
Albumin: 2.7 g/dL — ABNORMAL LOW (ref 3.5–5.0)
Alkaline Phosphatase: 48 U/L (ref 38–126)
Anion gap: 9 (ref 5–15)
BUN: 25 mg/dL — ABNORMAL HIGH (ref 8–23)
CO2: 21 mmol/L — ABNORMAL LOW (ref 22–32)
Calcium: 7.5 mg/dL — ABNORMAL LOW (ref 8.9–10.3)
Chloride: 105 mmol/L (ref 98–111)
Creatinine, Ser: 1.05 mg/dL — ABNORMAL HIGH (ref 0.44–1.00)
GFR, Estimated: 56 mL/min — ABNORMAL LOW (ref >60)
Glucose, Bld: 241 mg/dL — ABNORMAL HIGH (ref 70–99)
Potassium: 3.5 mmol/L (ref 3.5–5.1)
Sodium: 135 mmol/L (ref 135–145)
Total Bilirubin: 0.6 mg/dL (ref 0.3–1.2)
Total Protein: 5.2 g/dL — ABNORMAL LOW (ref 6.5–8.1)

## 2022-02-01 LAB — URINALYSIS, ROUTINE W REFLEX MICROSCOPIC
Bilirubin Urine: NEGATIVE
Glucose, UA: 150 mg/dL — AB
Ketones, ur: NEGATIVE mg/dL
Nitrite: NEGATIVE
Protein, ur: 100 mg/dL — AB
RBC / HPF: >50 RBC/hpf — ABNORMAL HIGH (ref 0–5)
Specific Gravity, Urine: >1.046 — ABNORMAL HIGH (ref 1.005–1.030)
pH: 5 (ref 5.0–8.0)

## 2022-02-01 LAB — CBC WITH DIFFERENTIAL/PLATELET
Abs Immature Granulocytes: 0.19 10*3/uL — ABNORMAL HIGH (ref 0.00–0.07)
Basophils Absolute: 0 10*3/uL (ref 0.0–0.1)
Basophils Relative: 0 %
Eosinophils Absolute: 0.1 10*3/uL (ref 0.0–0.5)
Eosinophils Relative: 1 %
HCT: 41.2 % (ref 36.0–46.0)
Hemoglobin: 13.9 g/dL (ref 12.0–15.0)
Immature Granulocytes: 2 %
Lymphocytes Relative: 14 %
Lymphs Abs: 1.8 10*3/uL (ref 0.7–4.0)
MCH: 30.8 pg (ref 26.0–34.0)
MCHC: 33.7 g/dL (ref 30.0–36.0)
MCV: 91.4 fL (ref 80.0–100.0)
Monocytes Absolute: 1.1 10*3/uL — ABNORMAL HIGH (ref 0.1–1.0)
Monocytes Relative: 9 %
Neutro Abs: 9.6 10*3/uL — ABNORMAL HIGH (ref 1.7–7.7)
Neutrophils Relative %: 74 %
Platelets: 231 10*3/uL (ref 150–400)
RBC: 4.51 MIL/uL (ref 3.87–5.11)
RDW: 13.3 % (ref 11.5–15.5)
WBC: 12.8 10*3/uL — ABNORMAL HIGH (ref 4.0–10.5)
nRBC: 0 % (ref 0.0–0.2)

## 2022-02-01 LAB — CBG MONITORING, ED
Glucose-Capillary: 288 mg/dL — ABNORMAL HIGH (ref 70–99)
Glucose-Capillary: 205 mg/dL — ABNORMAL HIGH (ref 70–99)
Glucose-Capillary: 188 mg/dL — ABNORMAL HIGH (ref 70–99)
Glucose-Capillary: 160 mg/dL — ABNORMAL HIGH (ref 70–99)

## 2022-02-01 LAB — LACTIC ACID, PLASMA: Lactic Acid, Venous: 1.3 mmol/L (ref 0.5–1.9)

## 2022-02-01 LAB — MAGNESIUM: Magnesium: 1.8 mg/dL (ref 1.7–2.4)

## 2022-02-01 LAB — DIGOXIN LEVEL: Digoxin Level: <0.2 ng/mL — ABNORMAL LOW (ref 0.8–2.0)

## 2022-02-01 MED ORDER — SODIUM CHLORIDE 0.9 % IV SOLN
2.0000 g | INTRAVENOUS | Status: DC
  Administered 2022-02-01 – 2022-02-02 (×2): 2 g via INTRAVENOUS
  Filled 2022-02-01 (×2): qty 20

## 2022-02-01 MED ORDER — APIXABAN 5 MG PO TABS
5.0000 mg | ORAL_TABLET | Freq: Two times a day (BID) | ORAL | Status: DC
  Administered 2022-02-01 – 2022-02-03 (×5): 5 mg via ORAL
  Filled 2022-02-01 (×5): qty 1

## 2022-02-01 MED ORDER — ACETAMINOPHEN 325 MG PO TABS: 650.0000 mg | ORAL_TABLET | Freq: Four times a day (QID) | ORAL | Status: DC | PRN

## 2022-02-01 MED ORDER — GUAIFENESIN-DM 100-10 MG/5ML PO SYRP: 10.0000 mL | ORAL_SOLUTION | ORAL | Status: DC | PRN

## 2022-02-01 MED ORDER — NALOXONE HCL 0.4 MG/ML IJ SOLN: 0.4000 mg | INTRAMUSCULAR | Status: DC | PRN

## 2022-02-01 MED ORDER — SODIUM CHLORIDE 0.9 % IV SOLN
2.0000 g | Freq: Once | INTRAVENOUS | Status: AC
  Administered 2022-02-01: 2 g via INTRAVENOUS
  Filled 2022-02-01: qty 20

## 2022-02-01 MED ORDER — SODIUM CHLORIDE 0.9 % IV SOLN: INTRAVENOUS | Status: DC

## 2022-02-01 MED ORDER — SODIUM CHLORIDE 0.9 % IV SOLN: 1.0000 g | INTRAVENOUS | Status: DC

## 2022-02-01 MED ORDER — HYDROCODONE-ACETAMINOPHEN 5-325 MG PO TABS: 1.0000 | ORAL_TABLET | Freq: Four times a day (QID) | ORAL | Status: DC | PRN

## 2022-02-01 MED ORDER — INSULIN ASPART 100 UNIT/ML IJ SOLN
0.0000 [IU] | Freq: Every day | INTRAMUSCULAR | Status: DC
  Administered 2022-02-02: 2 [IU] via SUBCUTANEOUS

## 2022-02-01 MED ORDER — DIGOXIN 125 MCG PO TABS
0.1250 mg | ORAL_TABLET | Freq: Every day | ORAL | Status: DC
  Administered 2022-02-01 – 2022-02-03 (×3): 0.125 mg via ORAL
  Filled 2022-02-01 (×3): qty 1

## 2022-02-01 MED ORDER — FENTANYL CITRATE PF 50 MCG/ML IJ SOSY: 25.0000 ug | PREFILLED_SYRINGE | INTRAMUSCULAR | Status: DC | PRN

## 2022-02-01 MED ORDER — DICLOFENAC SODIUM 1 % EX GEL
2.0000 g | Freq: Four times a day (QID) | CUTANEOUS | Status: DC
  Administered 2022-02-01 – 2022-02-03 (×7): 2 g via TOPICAL
  Filled 2022-02-01: qty 100

## 2022-02-01 MED ORDER — INSULIN ASPART 100 UNIT/ML IJ SOLN
0.0000 [IU] | Freq: Three times a day (TID) | INTRAMUSCULAR | Status: DC
  Administered 2022-02-01: 3 [IU] via SUBCUTANEOUS
  Administered 2022-02-01: 5 [IU] via SUBCUTANEOUS
  Administered 2022-02-01: 8 [IU] via SUBCUTANEOUS
  Administered 2022-02-02: 5 [IU] via SUBCUTANEOUS
  Administered 2022-02-02: 2 [IU] via SUBCUTANEOUS
  Administered 2022-02-02: 8 [IU] via SUBCUTANEOUS
  Administered 2022-02-03: 3 [IU] via SUBCUTANEOUS
  Administered 2022-02-03: 8 [IU] via SUBCUTANEOUS

## 2022-02-01 MED ORDER — ONDANSETRON HCL 4 MG/2ML IJ SOLN: 4.0000 mg | Freq: Four times a day (QID) | INTRAMUSCULAR | Status: DC | PRN

## 2022-02-01 MED ORDER — IOHEXOL 350 MG/ML SOLN
60.0000 mL | Freq: Once | INTRAVENOUS | Status: AC | PRN
  Administered 2022-02-01: 60 mL via INTRAVENOUS

## 2022-02-01 MED ORDER — TAMSULOSIN HCL 0.4 MG PO CAPS
0.4000 mg | ORAL_CAPSULE | Freq: Every day | ORAL | Status: DC
  Administered 2022-02-01 – 2022-02-03 (×3): 0.4 mg via ORAL
  Filled 2022-02-01 (×3): qty 1

## 2022-02-01 MED ORDER — ROPINIROLE HCL 0.5 MG PO TABS
4.0000 mg | ORAL_TABLET | Freq: Every day | ORAL | Status: DC
  Administered 2022-02-01 – 2022-02-02 (×2): 4 mg via ORAL
  Filled 2022-02-01: qty 4
  Filled 2022-02-01: qty 8

## 2022-02-01 MED ORDER — SODIUM CHLORIDE 0.9 % IV SOLN: INTRAVENOUS | Status: AC

## 2022-02-01 MED ORDER — ACETAMINOPHEN 650 MG RE SUPP: 650.0000 mg | Freq: Four times a day (QID) | RECTAL | Status: DC | PRN

## 2022-02-01 NOTE — Inpatient Diabetes Management (Addendum)
Inpatient Diabetes Program Recommendations  AACE/ADA: New Consensus Statement on Inpatient Glycemic Control (2015)  Target Ranges:  Prepandial:   less than 140 mg/dL      Peak postprandial:   less than 180 mg/dL (1-2 hours)      Critically ill patients:  140 - 180 mg/dL   Lab Results  Component Value Date   GLUCAP 205 (H) 02/01/2022   HGBA1C 8.9 (H) 11/04/2021    Review of Glycemic Control  Latest Reference Range & Units 02/01/22 08:05 02/01/22 11:57  Glucose-Capillary 70 - 99 mg/dL 288 (H) 205 (H)   Diabetes history: New Diagnosis Outpatient Diabetes medications: None Current orders for Inpatient glycemic control:  Novolog 0-15 units tid + hs  A1c 8.9% on 10/5  Inpatient Diabetes Program Recommendations:    Novolog correction scale just started this am. Will watch glucose trends today.  It does not look like pt is on medications outpt for glucose management. May want to consider Tradjenta 5 mg Daily as it is fecally cleared.  Spoke with pt at bedside regarding A1c level and pt not being on medications at home. Pt reports having pre Diabetes for the last year. Pt sees her PCP about every 6 months for follow up. Pt has never been on medications in the past for glucose control. Discussed A1c level. Pt reports her brother had Diabetes at a young age but neither of her parents had it.   Discussed basic patho of Diabetes. Discussed glucose and A1c goals. Told pt to check glucose first thing in the morning. Pt reports having a glucometer at home and all supplies needed. Explained to pt she maybe placed on an oral med at time of d/c and to follow up with her PCP. Discussed in detail lifestyle modifications with diet and exercise. Pt had many questions regarding diet.   Thanks,  Tama Headings RN, MSN, BC-ADM Inpatient Diabetes Coordinator Team Pager [phone] (8a-5p)

## 2022-02-01 NOTE — ED Notes (Signed)
Assisted pt to restroom in wheelchair. Urine specimen sent for testing

## 2022-02-01 NOTE — ED Notes (Signed)
Sharyn Lull (daughter) would like to be called abt status of pt prefereably before 9a. If call after 9 a, please leave a message, phone is [phone].

## 2022-02-01 NOTE — H&P (Addendum)
History and Physical    Patient: Briana Foster AJO:[id number] DOB: 1945/07/20 DOA: 01/31/2022 DOS: the patient was seen and examined on 02/01/2022 PCP: Georgann Housekeeper, MD  Patient coming from: Home  Chief Complaint:  Chief Complaint  Patient presents with   Abdominal Pain   Hypotension   HPI: [name] is a 77 y.o. 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  Patient had just recently been hospitalized from 12/27-12/29 with acute hypoxic respiratory failure secondary to COVID-19 infection.  Patient has been treated with remdesivir and steroids with improvement in symptoms able to be weaned off oxygen to discharge.  After getting home patient reports having sharp pains in the right lower quadrant of her abdomen along with some referred pain in her back.  Noted associated symptoms of nausea, vomiting, and decreased appetite.  She reports having urinary frequency getting up 6-7 times per night usually, but denied any discomfort with urinating.  Patient reports still having a cough and some shortness of breath with ambulating, but felt symptoms are better than when she was initially admitted with COVID-19 last week..  Last hospitalized with pyelonephritis back in October 2022 where patient was noted to have right-sided hydronephrosis.  At discharge she reports being told that she would be set up with appointment with urology, but this was never done.  She reports calling the office, but had not been able to be seen yet.  In the emergency department patient was noted to be hypotensive with blood pressures as low as 86/54 with improvement with IV fluids.  Labs from yesterday significant for WBC 16.4, hemoglobin 16.4, BUN 26, creatinine 1.41, glucose 336, anion gap 14, lipase 78, and lactic acid 2.2->  1.3.  Urinalysis significant for large hemoglobin, small leukocytes, elevated specific gravity >1.046, few bacteria, greater than 50 RBC/hpf, and 21-50 WBCs.  CT scan abdomen pelvis concerning for pyelonephritis with nonobstructing right renal calculus.  Urine cultures were obtained.  Patient received at least 500 mL bolus of normal saline IV fluids, IV, and Rocephin.   Review of Systems: As mentioned in the history of present illness. All other systems reviewed and are negative. 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 Clifton James & Dr. Cornelius Moras   Severe aortic stenosis    Type 2 diabetes mellitus without complication, without long-term current use of insulin (HCC) 04/10/2018   Vitamin D deficiency    Past Surgical History:  Procedure Laterality Date   AV NODE ABLATION N/A 07/14/2021   Procedure: AV NODE ABLATION;  Surgeon: Regan Lemming, MD;  Location: [facility];  Service: Cardiovascular;  Laterality: N/A;  BUNIONECTOMY  20 yrs ago   bil feet   BUNIONECTOMY  11/30/2011   Procedure: BUNIONECTOMY;  Surgeon: Drucilla Schmidt, MD;  Location: [facility] OR[facility];  Service: Orthopedics;  Laterality: Bilateral;  RIGHT FOOT EXCISION OF BUNIONETTE AND PARTIAL   PROXIMAL PHALANGECTOMY OF 5TH TOE LEFT FOOT FUNK BUNIONECTOMY,EXCISION OF BUNIONETTE    CARDIOVERSION N/A 11/05/2019   Procedure: CARDIOVERSION;  Surgeon: Lewayne Bunting, MD;  Location: [facility] ENDOSCOPY;  Service: Cardiovascular;  Laterality: N/A;   CARDIOVERSION N/A 12/05/2019   Procedure: CARDIOVERSION;  Surgeon: Parke Poisson, MD;  Location: [facility] ENDOSCOPY;  Service: Cardiovascular;  Laterality: N/A;   COLONOSCOPY WITH PROPOFOL N/A 08/07/2012   Procedure: COLONOSCOPY WITH PROPOFOL;  Surgeon: Charolett Bumpers, MD;  Location: [facility] ENDOSCOPY;  Service: Endoscopy;  Laterality: N/A;   MOUTH SURGERY     teeth extractions   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   Procedure: PACEMAKER IMPLANT;  Surgeon: Regan Lemming, MD;  Location: [facility];  Service: Cardiovascular;  Laterality: N/A;   RIGHT/LEFT HEART CATH AND CORONARY ANGIOGRAPHY N/A 06/17/2020   Procedure: RIGHT/LEFT HEART CATH AND CORONARY ANGIOGRAPHY;  Surgeon: Lyn Records, MD;  Location: [facility];  Service: Cardiovascular;  Laterality: N/A;   TONSILLECTOMY     TRANSCATHETER AORTIC VALVE REPLACEMENT, TRANSFEMORAL N/A 06/30/2020   Procedure: TRANSCATHETER AORTIC VALVE REPLACEMENT, TRANSFEMORAL;  Surgeon: Kathleene Hazel, MD;  Location: [facility];  Service: Open Heart Surgery;  Laterality: N/A;   TUBAL LIGATION     WRIST SURGERY     Social History:  reports that she has never smoked. She has never used smokeless tobacco. She reports that she does not drink alcohol and does not use drugs.  Allergies  Allergen Reactions   Bee Venom Shortness Of Breath and Rash   Claritin [Loratadine] Itching and Palpitations   Wasp Venom Other (See Comments) and Anaphylaxis   Apricot Flavor Swelling    Eyes swell shut   Atorvastatin Itching    Eye swelling Other reaction(s): HA    Family History  Problem Relation Age of Onset   Heart disease Mother    Stroke Mother    Cancer Father        Prostate    Prior to Admission medications   Medication Sig Start Date End Date Taking? Authorizing Provider  acetaminophen (TYLENOL) 500 MG tablet Take 500-1,000 mg by mouth every 8  (eight) hours as needed for moderate pain.    [provider]  albuterol (VENTOLIN HFA) 108 (90 Base) MCG/ACT inhaler Inhale 2 puffs into the lungs every 4 (four) hours as needed for wheezing or shortness of breath.    [provider]  amLODipine (NORVASC) 10 MG tablet Take 1 tablet (10 mg total) by mouth daily. 11/03/21   Gaston Islam., NP  apixaban (ELIQUIS) 5 MG TABS tablet Take 1 tablet (5 mg total) by mouth 2 (two) times daily. 06/18/21   Lyn Records, MD  benzonatate (TESSALON) 200 MG capsule Take 200 mg by mouth 3 (three) times daily as needed for cough. 11/12/21   [provider]  cetirizine (ZYRTEC) 10 MG tablet Take 10 mg by mouth daily as needed for allergies.    [provider]  Cyanocobalamin (VITAMIN B-12 PO) Take 1 tablet by mouth daily.    [provider]  diclofenac Sodium (VOLTAREN) 1 % GEL Apply 2 g topically 4 (four) times daily. 11/09/21   Dimple Nanas, MD  digoxin (LANOXIN) 0.125  MG tablet Take 1 tablet (0.125 mg total) by mouth daily. 06/11/21   [name], PA-C  diltiazem (DILACOR XR) 180 MG 24 hr capsule Take 180 mg by mouth daily.    [provider]  docusate sodium (COLACE) 100 MG capsule Take 1 capsule (100 mg total) by mouth 2 (two) times daily. Patient taking differently: Take 100 mg by mouth daily as needed for mild constipation. 11/09/21   Amin, [name], MD  guaiFENesin-dextromethorphan (ROBITUSSIN DM) 100-10 MG/5ML syrup Take 10 mLs by mouth every 4 (four) hours as needed for cough. 01/28/22   Geradine Girt, DO  meclizine (ANTIVERT) 25 MG tablet Take 25 mg by mouth 2 (two) times daily as needed (vertigo). 11/12/20   [provider]  Multiple Vitamins-Minerals (MULTIVITAMIN WITH MINERALS) tablet Take 1 tablet by mouth daily.    [provider]  predniSONE (DELTASONE) 50 MG tablet Take daily x 3 days 01/29/22   Geradine Girt, DO  rOPINIRole (REQUIP) 4 MG tablet Take 4 mg  by mouth at bedtime.    [provider]    Physical Exam: Vitals:   02/01/22 0344 02/01/22 0420 02/01/22 0445 02/01/22 0530  BP:  112/65 116/62 116/63  Pulse:  63 63 62  Resp:  (!) 22 18 19   Temp: (!) 97.3 F (36.3 C)     TempSrc: Oral     SpO2:  93% 95% 90%  Weight:      Height:       Exam  Constitutional: Elderly female who appears to be in no acute distress but appears fatigued Eyes: PERRL, lids and conjunctivae normal ENMT: Mucous membranes are dry..  Dentition. Neck: normal, supple, no JVD. Respiratory: clear to auscultation bilaterally, no wheezing, no crackles. Normal respiratory effort. No accessory muscle use.  Cardiovascular: Paced rhythm.  No significant lower extremity edema. Abdomen: Mild right lower quadrant tenderness appreciated.  No CVA tenderness at this time.  Bowel sounds present all 4 quadrants. Musculoskeletal: no clubbing / cyanosis.  Moderate crepitation noted of the bilateral knees Skin: no rashes, lesions, ulcers.  Poor skin turgor. Neurologic: CN 2-12 grossly intact. Strength 5/5 in all 4.  Psychiatric: Normal judgment and insight. Alert and oriented x 3. Normal mood.   Data Reviewed:  EKG reveals a ventricular paced rhythm with underlying atrial fibrillation at 60 bpm.  Repeat labs from today note downtrending leukocytosis 16.4->12.8.Marland Kitchen  Assessment and Plan:  Pyelonephritis Acute.  Patient presented with complaints of right lower quadrant abdominal pain. Urinalysis significant for large hemoglobin, small leukocytes, elevated specific gravity >1.046, few bacteria, greater than 50 RBC/hpf, and 21-50 WBCs.  Previous culture from 12/27 had showed multiple species for which recollection was suggested.  Repeat cultures have been obtained and she was started on Rocephin IV. -Admit to a telemetry bed -Follow-up urine cultures -Continue Rocephin IV -Hydrocodone/fentanyl IV as needed for pain  Leukocytosis Acute.  WBC elevated at 16.4-> 12.8.   Secondary to above. -Continue to monitor  Transient hypotension Resolved.  Initial blood pressures 86/54 improved with bolus of 500 mL of normal saline IV fluids. -MAP greater than 65 -Hold amlodipine  -Plan to resume Cardizem once systolic blood pressures consistently greater than 100  Acute kidney injury Patient presented with creatinine elevated up to 1.41 with BUN 26.  Creatinine had been 0.7 prior to discharge couple days ago.  Suspect secondary to dehydration with elevated specific gravity on urinalysis along with infection.  Nonobstructing stone seen on CT.  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  Currently in a paced rhythm at this time.  As remote check in 10/2021 noted normal device function with VP increased mid-June. -Continue digoxin and Eliquis -Check digoxin level -Resume diltiazem once medically appropriate  Uncontrolled diabetes mellitus type 2 with hyperglycemia Last hemoglobin A1c 8.9 on 11/04/2021. -Hypoglycemic protocols -CBGs before every meal with moderate SSI -NovoLog 05 units nightly -Adjust regimen as needed  Weakness (acute) osteoarthritis of bilateral knees (chronic) Patient reported having difficulty standing from a sitting position at home with significant pain in bilateral knees for which she has had a difficult time getting around. -Continue Voltaren gel -PT to eval and treat  Hyponatremia Acute.  Sodium 131 on admission, but noted to be within normal limits when adjusted for hyperglycemia.  Chronic diastolic CHF Patient appear to be hypovolemic on initial presentation.  Last echocardiogram noted EF to be 60 to 65% with indeterminate diastolic parameters, and status post  TAVR with mean aortic gradient measuring 7 mmHg back in 05/2021. -Monitor intake and output -Daily weights  CAD AS s/p TAVR Denied any complaints of chest pain  Nephrolithiasis Nonobstructing right renal calculus was seen, but hydronephrosis no longer present. -Flomax daily -Ambulatory referral [facility] to [facility]  History of COVID-19 Patient had just recently been admitted in the hospital from 12/27-12/29/2023 with COVID-19 treated with remdesivir and steroids due to hypoxia requiring 2 L of oxygen.  Restless leg syndrome -Continue allopurinol  DVT prophylaxis: Eliquis Advance Care Planning:   Code Status: Full Code    Consults: None  Family Communication: Called daughter to give update  Severity of Illness: The appropriate patient status for this patient is INPATIENT. Inpatient status is judged to be reasonable and necessary in order to provide the required intensity of service to ensure the patient's safety. The patient's presenting symptoms, physical exam findings, and initial radiographic and laboratory data in the context of their chronic comorbidities is felt to place them at high risk for further clinical deterioration. Furthermore, it is not anticipated that the patient will be medically stable for discharge from the hospital within 2 midnights of admission.   * I certify that at the point of admission it is my clinical judgment that the patient will require inpatient hospital care spanning beyond 2 midnights from the point of admission due to high intensity of service, high risk for further deterioration and high frequency of surveillance required.*  Author: Norval Morton, MD 02/01/2022 7:19 AM  For on call review [email or web address].

## 2022-02-01 NOTE — Progress Notes (Signed)
  Carryover admission to the Day Admitter.  I discussed this case with the EDP, Dr. Stark Jock.  Per these discussions:   This is a 77 year old female with type 2 diabetes mellitus, who is being admitted with suspected acute pyelonephritis after presenting with new onset back discomfort, with ensuing urinalysis reportedly consistent with UTI, while CT Abdo/pelvis shows evidence consistent with pyelonephritis, in the absence of evidence of obstructing ureteral stone.  Noted to initially be mildly hypotensive, with presenting blood pressure 86/54, subsequently improving to 116/62 following interval IV fluids.  Presenting labs also notable for mild leukocytosis, with white blood cell count 16,000.  Initial lactate 2.2, with repeat trending down to 1.3 following interval IV fluids.  Urine culture collected and the patient was started on Rocephin.  I have placed an order for inpatient admission to med/tele for further evaluation and management of the above.  I have placed some additional preliminary admit orders via the adult multi-morbid admission order set. I have also ordered additional Rocephin, urine fentanyl, prn Zofran, continuous normal saline at 75 cc/h x 8 hours.  I will also ordered morning labs in the form of CMP, CBC with differential, and serum magnesium level.    Babs Bertin, DO Hospitalist

## 2022-02-02 DIAGNOSIS — N10 Acute tubulo-interstitial nephritis: Secondary | ICD-10-CM | POA: Diagnosis not present

## 2022-02-02 LAB — URINE CULTURE: Culture: <10000 — AB

## 2022-02-02 LAB — GLUCOSE, CAPILLARY
Glucose-Capillary: 129 mg/dL — ABNORMAL HIGH (ref 70–99)
Glucose-Capillary: 221 mg/dL — ABNORMAL HIGH (ref 70–99)
Glucose-Capillary: 245 mg/dL — ABNORMAL HIGH (ref 70–99)

## 2022-02-02 MED ORDER — DILTIAZEM HCL ER COATED BEADS 180 MG PO CP24
180.0000 mg | ORAL_CAPSULE | Freq: Every day | ORAL | Status: DC
  Administered 2022-02-02 – 2022-02-03 (×2): 180 mg via ORAL
  Filled 2022-02-02 (×2): qty 1

## 2022-02-02 NOTE — Progress Notes (Addendum)
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  She was recently hospitalized for COVID-19 infection, status post treatment and discharged home.  She was having right lower quadrant pain and right flank pain, with nausea, vomiting, decreased appetite.  She also reported increased urinary frequency  Subjective / 24h Interval events: She feels a little bit better this morning, appreciates less right lower quadrant abdominal pain.  Frequency is better.  Assesement and Plan: Principal Problem:   Acute pyelonephritis Active Problems:   Leukocytosis   Transient hypotension   AKI (acute kidney injury) ([place])   Persistent atrial fibrillation (HCC)   Chronic anticoagulation   Uncontrolled type 2 diabetes mellitus with hyperglycemia, without long-term current use of insulin (HCC)   Weakness   Chronic diastolic CHF (congestive heart failure) (HCC)   S/P TAVR (transcatheter aortic valve replacement)   Nephrolithiasis   History of COVID-19   Restless leg syndrome   Principal problem Pyelonephritis - patient was admitted to the hospital with significant leukocytosis with a white count of 16.4 K, and 2 days prior it was 8.3.  She has right flank pain and right groin pain as well as increased frequency, clinically consistent with pyelonephritis.  She was placed on ceftriaxone and white count is now improving and she has improved symptoms.  Allow diet, see if she tolerates p.o.  Lactic acid was also elevated on admission at 2.2, improved to 1.3 after fluids.  Urinalysis positive for infection with few bacteria, small leukocytes, however urine cultures are unfortunately negative.   Given clinical picture, continue IV antibiotics, if her white count remains improved and she is able to tolerate a regular diet may be able to go home tomorrow  Active problems Transient hypotension -improved with fluids.  Now hypertensive, resume Diltiazem and monitor  Acute kidney injury -Patient presented with creatinine elevated up to 1.41 with BUN 26.  Creatinine had been 0.7 prior to discharge couple days ago.  Due to dehydration, creatinine improving with fluids.  Encourage p.o. intake   Persistent atrial fibrillation on chronic anticoagulation, tachybradycardia syndrome s/p AV ablation and permanent pacemaker -Patient is s/p [place] pacemaker and 07/2020.  Currently in a paced rhythm at this time.  As remote check in 10/2021 noted normal device function with VP increased mid-June. Continue digoxin and Eliquis   Uncontrolled diabetes mellitus type 2 with hyperglycemia -Last hemoglobin A1c 8.9 on 11/04/2021.   Chronic diastolic CHF -Patient appear to be hypovolemic on initial presentation.  Last echocardiogram noted EF to be 60 to 65% with indeterminate diastolic parameters, and status post TAVR with mean aortic gradient measuring 7 mmHg back in 05/2021.  Volume status better this morning   CAD, AS s/p TAVR -stable, no apparent issues   Nephrolithiasis -Nonobstructing right renal calculus was seen, but hydronephrosis no longer present.   History of COVID-19 -Patient had just recently been admitted in the hospital from 12/27-12/29/2023 with COVID-19 treated with remdesivir and steroids due to hypoxia requiring 2 L of oxygen.and monitor.  She is better from the standpoint    Scheduled Meds:  apixaban  5 mg Oral BID   diclofenac Sodium  2 g Topical QID   digoxin  0.125 mg Oral Daily  insulin aspart  0-15 Units Subcutaneous TID WC   insulin aspart  0-5 Units Subcutaneous QHS   rOPINIRole  4 mg Oral QHS   tamsulosin  0.4 mg Oral Daily   Continuous Infusions:  cefTRIAXone (ROCEPHIN)   IV Stopped (02/01/22 2241)   PRN Meds:.acetaminophen **OR** acetaminophen, fentaNYL (SUBLIMAZE) injection, guaiFENesin-dextromethorphan, HYDROcodone-acetaminophen, naLOXone (NARCAN)  injection, ondansetron (ZOFRAN) IV  Current Outpatient Medications  Medication Instructions   acetaminophen (TYLENOL) 500-1,000 mg, Oral, Every 8 hours PRN   albuterol (VENTOLIN HFA) 108 (90 Base) MCG/ACT inhaler 2 puffs, Inhalation, Every 4 hours PRN   amLODipine (NORVASC) 10 mg, Oral, Daily   apixaban (ELIQUIS) 5 mg, Oral, 2 times daily   benzonatate (TESSALON) 200 mg, 3 times daily PRN   cetirizine (ZYRTEC) 10 mg, Oral, Daily PRN   Cyanocobalamin (VITAMIN B-12 PO) 1 tablet, Oral, Daily   diclofenac Sodium (VOLTAREN) 2 g, Topical, 4 times daily   digoxin (LANOXIN) 0.125 mg, Oral, Daily   diltiazem (DILACOR XR) 180 mg, Oral, Daily   docusate sodium (COLACE) 100 mg, Oral, 2 times daily   guaiFENesin-dextromethorphan (ROBITUSSIN DM) 100-10 MG/5ML syrup 10 mLs, Oral, Every 4 hours PRN   meclizine (ANTIVERT) 25 mg, Oral, 2 times daily PRN   Multiple Vitamins-Minerals (MULTIVITAMIN WITH MINERALS) tablet 1 tablet, Oral, Daily   predniSONE (DELTASONE) 50 MG tablet Take daily x 3 days   rOPINIRole (REQUIP) 4 mg, Oral, Daily at bedtime    Diet Orders (From admission, onward)     Start     Ordered   02/01/22 0708  Diet heart healthy/carb modified Room service appropriate? Yes; Fluid consistency: Thin  Diet effective now       Question Answer Comment  Diet-HS Snack? Nothing   Room service appropriate? Yes   Fluid consistency: Thin      02/01/22 0707            DVT prophylaxis: SCDs Start: 02/01/22 0529 apixaban (ELIQUIS) tablet 5 mg   Lab Results  Component Value Date   PLT 231 02/01/2022      Code Status: Full Code  Family Communication: no family at bedside   Status is: Inpatient  Remains inpatient appropriate because: IV antibiotics, monitor WBC, oral intake   Level of care: Telemetry  Medical  Consultants:  none  Objective: Vitals:   02/01/22 2100 02/01/22 2310 02/02/22 0341 02/02/22 0744  BP: 127/63 (!) 161/73 (!) 177/80 (!) 151/70  Pulse: 62 65 64 65  Resp: 20 18 18    Temp: 98 F (36.7 C) 97.8 F (36.6 C) 98.1 F (36.7 C) 97.9 F (36.6 C)  TempSrc:  Oral Oral Oral  SpO2: 96% 96% 95% 95%  Weight:      Height:        Intake/Output Summary (Last 24 hours) at 02/02/2022 1205 Last data filed at 02/02/2022 0500 Gross per 24 hour  Intake 0 ml  Output --  Net 0 ml   Wt Readings from Last 3 Encounters:  01/31/22 81 kg  01/26/22 81 kg  11/04/21 81.6 kg    Examination:  Constitutional: NAD Eyes: no scleral icterus ENMT: Mucous membranes are moist.  Neck: normal, supple Respiratory: clear to auscultation bilaterally, no wheezing, no crackles. Normal respiratory effort. No accessory muscle use.  Cardiovascular: Regular rate and rhythm, no murmurs / rubs / gallops. No LE edema.  Abdomen: non distended, no tenderness. Bowel sounds positive.  Musculoskeletal: no clubbing / cyanosis.    Data Reviewed: I have independently reviewed following labs and imaging  studies   CBC Recent Labs  Lab 01/27/22 0146 01/28/22 0651 01/31/22 2151 01/31/22 2200 02/01/22 0615  WBC 5.0 8.3 16.4*  --  12.8*  HGB 11.4* 12.3 16.4* 16.7* 13.9  HCT 35.1* 35.9* 46.2* 49.0* 41.2  PLT 152 166 240  --  231  MCV 94.6 92.1 89.5  --  91.4  MCH 30.7 31.5 31.8  --  30.8  MCHC 32.5 34.3 35.5  --  33.7  RDW 13.6 13.4 12.9  --  13.3  LYMPHSABS 0.6* 1.2 2.2  --  1.8  MONOABS 0.1 0.7 1.6*  --  1.1*  EOSABS 0.0 0.0 0.1  --  0.1  BASOSABS 0.0 0.0 0.1  --  0.0    Recent Labs  Lab 01/26/22 1500 01/26/22 1847 01/27/22 0146 01/28/22 0651 01/31/22 2151 01/31/22 2200 02/01/22 0201 02/01/22 0846  NA  --   --  137 137 131* 132*  --  135  K  --   --  4.3 3.8 4.0 4.1  --  3.5  CL  --   --  106 103 95* 99  --  105  CO2  --   --  23 23 22   --   --  21*  GLUCOSE  --   --  198* 204*  336* 325*  --  241*  BUN  --   --  13 27* 26* 32*  --  25*  CREATININE  --   --  0.78 0.74 1.41* 1.20*  --  1.05*  CALCIUM  --   --  8.8* 9.3 9.1  --   --  7.5*  AST  --   --  24 22 22   --   --  13*  ALT  --   --  27 27 27   --   --  19  ALKPHOS  --   --  57 54 67  --   --  48  BILITOT  --   --  0.8 0.6 0.6  --   --  0.6  ALBUMIN  --   --  3.4* 3.4* 3.8  --   --  2.7*  MG  --   --   --   --   --   --   --  1.8  CRP 1.3*  --   --   --   --   --   --   --   DDIMER 1.12*  --   --   --   --   --   --   --   PROCALCITON <0.10  --   --   --   --   --   --   --   LATICACIDVEN  --  1.4  --   --  2.2*  --  1.3  --     ------------------------------------------------------------------------------------------------------------------ No results for input(s): "CHOL", "HDL", "LDLCALC", "TRIG", "CHOLHDL", "LDLDIRECT" in the last 72 hours.  Lab Results  Component Value Date   HGBA1C 8.9 (H) 11/04/2021   ------------------------------------------------------------------------------------------------------------------ No results for input(s): "TSH", "T4TOTAL", "T3FREE", "THYROIDAB" in the last 72 hours.  Invalid input(s): "FREET3"  Cardiac Enzymes No results for input(s): "CKMB", "TROPONINI", "MYOGLOBIN" in the last 168 hours.  Invalid input(s): "CK" ------------------------------------------------------------------------------------------------------------------    Component Value Date/Time   BNP 585.9 (H) 01/26/2022 0805    CBG: Recent Labs  Lab 01/28/22 1116 02/01/22 0805 02/01/22 1157 02/01/22 1721 02/01/22 2104  GLUCAP 327* 288* 205* 188* 160*    Recent Results (from  the past 240 hour(s))  Resp panel by RT-PCR (RSV, Flu A&B, Covid) Anterior Nasal Swab     Status: Abnormal   Collection Time: 01/26/22  7:35 AM   Specimen: Anterior Nasal Swab  Result Value Ref Range Status   SARS Coronavirus 2 by RT PCR POSITIVE (A) NEGATIVE Final    Comment: (NOTE) SARS-CoV-2 target nucleic  acids are DETECTED.  The SARS-CoV-2 RNA is generally detectable in upper respiratory specimens during the acute phase of infection. Positive results are indicative of the presence of the identified virus, but do not rule out bacterial infection or co-infection with other pathogens not detected by the test. Clinical correlation with patient history and other diagnostic information is necessary to determine patient infection status. The expected result is Negative.  Fact Sheet for Patients: EntrepreneurPulse.com.au  Fact Sheet for Healthcare Providers: IncredibleEmployment.be  This test is not yet approved or cleared by the [place] FDA and  has been authorized for detection and/or diagnosis of SARS-CoV-2 by FDA under an Emergency Use Authorization (EUA).  This EUA will remain in effect (meaning this test can be used) for the duration of  the COVID-19 declaration under Section 564(b)(1) of the A ct, 21 U.S.C. section 360bbb-3(b)(1), unless the authorization is terminated or revoked sooner.     Influenza A by PCR NEGATIVE NEGATIVE Final   Influenza B by PCR NEGATIVE NEGATIVE Final    Comment: (NOTE) The Xpert Xpress SARS-CoV-2/FLU/RSV plus assay is intended as an aid in the diagnosis of influenza from Nasopharyngeal swab specimens and should not be used as a sole basis for treatment. Nasal washings and aspirates are unacceptable for Xpert Xpress SARS-CoV-2/FLU/RSV testing.  Fact Sheet for Patients: EntrepreneurPulse.com.au  Fact Sheet for Healthcare Providers: IncredibleEmployment.be  This test is not yet approved or cleared by the [place] FDA and has been authorized for detection and/or diagnosis of SARS-CoV-2 by FDA under an Emergency Use Authorization (EUA). This EUA will remain in effect (meaning this test can be used) for the duration of the COVID-19 declaration under Section 564(b)(1) of  the Act, 21 U.S.C. section 360bbb-3(b)(1), unless the authorization is terminated or revoked.     Resp Syncytial Virus by PCR NEGATIVE NEGATIVE Final    Comment: (NOTE) Fact Sheet for Patients: EntrepreneurPulse.com.au  Fact Sheet for Healthcare Providers: IncredibleEmployment.be  This test is not yet approved or cleared by the [place] FDA and has been authorized for detection and/or diagnosis of SARS-CoV-2 by FDA under an Emergency Use Authorization (EUA). This EUA will remain in effect (meaning this test can be used) for the duration of the COVID-19 declaration under Section 564(b)(1) of the Act, 21 U.S.C. section 360bbb-3(b)(1), unless the authorization is terminated or revoked.  Performed at [facility], [place] [address] [place], [place] [address]   Blood Culture (routine x 2)     Status: None   Collection Time: 01/26/22  7:36 AM   Specimen: BLOOD  Result Value Ref Range Status   Specimen Description BLOOD  Final   Special Requests   Final    BOTTLES DRAWN AEROBIC AND ANAEROBIC Blood Culture adequate volume   Culture   Final    NO GROWTH 5 DAYS Performed at [facility], [place] [address], [place], [place] [address]    Report Status 01/31/2022 FINAL  Final  Blood Culture (routine x 2)     Status: None   Collection Time: 01/26/22  7:41 AM   Specimen: BLOOD  Result Value Ref Range Status  Specimen Description BLOOD RIGHT ANTECUBITAL  Final   Special Requests   Final    BOTTLES DRAWN AEROBIC AND ANAEROBIC Blood Culture adequate volume   Culture   Final    NO GROWTH 5 DAYS Performed at [facility], [address] [place], [place] [address]    Report Status 01/31/2022 FINAL  Final  Urine Culture     Status: Abnormal   Collection Time: 01/26/22 11:55 AM   Specimen: In/Out Cath Urine  Result Value Ref Range Status   Specimen Description IN/OUT CATH URINE  Final   Special Requests   Final    NONE Performed  at [facility], [address] [place], [place] [address]    Culture MULTIPLE SPECIES PRESENT, SUGGEST RECOLLECTION (A)  Final   Report Status 01/27/2022 FINAL  Final  Urine Culture     Status: Abnormal   Collection Time: 02/01/22  1:31 AM   Specimen: Urine, Clean Catch  Result Value Ref Range Status   Specimen Description URINE, CLEAN CATCH  Final   Special Requests NONE  Final   Culture (A)  Final    <10,000 COLONIES/mL INSIGNIFICANT GROWTH Performed at [facility], [address], [place], [place] [address]    Report Status 02/02/2022 FINAL  Final     Radiology Studies: No results found.   Pamella Pert, MD, PhD [facility]  Between 7 am - 7 pm I am available, please contact me via Amion (for emergencies) or Securechat (non urgent messages)  Between 7 pm - 7 am I am not available, please contact night coverage MD/APP via Amion

## 2022-02-02 NOTE — Evaluation (Signed)
Physical Therapy Evaluation and Discharge  Patient Details Name: Briana Foster MRN: [id number] DOB: 07/31/1945 Today's Date: 02/02/2022  History of Present Illness  Patient is a 77 year old female admitted with abdominal pain and pyelonephritis, transient hypotension with MAP goal >65. Recent admission with COVID. History of osteoarthritis of bilateral knees with difficulty mobilizing at home.   Clinical Impression  Patient agreeable to PT evaluation. She reports using a cane or rollator intermittently at home with mobility. Her daughter lives with her.  Today, the patient requires no physical assistance with mobility. She ambulated 30f in with the rolling walker with no loss of balance or shortness of breath with activity. She has been going to and from the bathroom without staff assistance per her report. She is likely at or nearing her baseline level of functional mobility. She does have history of osteoarthritis of her knees and has some difficulty at times with mobility at home. Consider HHPT, although patient declines the need for this. No further acute PT needs at this time.      Recommendations for follow up therapy are one component of a multi-disciplinary discharge planning process, led by the attending physician.  Recommendations may be updated based on patient status, additional functional criteria and insurance authorization.  Follow Up Recommendations Home health PT (although patient declines)      Assistance Recommended at Discharge PRN  Patient can return home with the following  Assist for transportation;Help with stairs or ramp for entrance    Equipment Recommendations None recommended by PT  Recommendations for Other Services       Functional Status Assessment Patient has not had a recent decline in their functional status     Precautions / Restrictions Precautions Precautions: Fall Restrictions Weight Bearing Restrictions: No      Mobility  Bed Mobility                General bed mobility comments: not assessed as patient sitting up on arrival and post session    Transfers Overall transfer level: Needs assistance Equipment used: None Transfers: Sit to/from Stand Sit to Stand: Modified independent (Device/Increase time)                Ambulation/Gait Ambulation/Gait assistance: Supervision, Modified independent (Device/Increase time) Gait Distance (Feet): 60 Feet Assistive device: Rolling walker (2 wheels) Gait Pattern/deviations: Step-through pattern, Trunk flexed Gait velocity: decreased     General Gait Details: patient ambulated using rolling walker with no loss of balance or shortness of breath noted  Stairs            Wheelchair Mobility    Modified Rankin (Stroke Patients Only)       Balance Overall balance assessment: Mild deficits observed, not formally tested                                           Pertinent Vitals/Pain Pain Assessment Pain Assessment: No/denies pain    Home Living Family/patient expects to be discharged to:: Private residence Living Arrangements: Children Available Help at Discharge: Family;Available 24 hours/day Type of Home: House Home Access: Stairs to enter Entrance Stairs-Rails: Can reach both Entrance Stairs-Number of Steps: 3   Home Layout: One level Home Equipment: Cane - single point;Rollator (4 wheels);Shower seat - built in;BSC/3in1      Prior Function Prior Level of Function : Independent/Modified Independent  Mobility Comments: using cane or rollator as needed ADLs Comments: independent     Hand Dominance        Extremity/Trunk Assessment   Upper Extremity Assessment Upper Extremity Assessment: Overall WFL for tasks assessed    Lower Extremity Assessment Lower Extremity Assessment: Overall WFL for tasks assessed       Communication   Communication: No difficulties  Cognition Arousal/Alertness:  Awake/alert Behavior During Therapy: WFL for tasks assessed/performed Overall Cognitive Status: Within Functional Limits for tasks assessed                                          General Comments General comments (skin integrity, edema, etc.): educated patient on energy conservation, progressing activity slowly.    Exercises     Assessment/Plan    PT Assessment All further PT needs can be met in the next venue of care  PT Problem List Decreased mobility;Decreased activity tolerance       PT Treatment Interventions      PT Goals (Current goals can be found in the Care Plan section)  Acute Rehab PT Goals PT Goal Formulation: All assessment and education complete, DC therapy    Frequency       Co-evaluation               AM-PAC PT "6 Clicks" Mobility  Outcome Measure Help needed turning from your back to your side while in a flat bed without using bedrails?: None Help needed moving from lying on your back to sitting on the side of a flat bed without using bedrails?: None Help needed moving to and from a bed to a chair (including a wheelchair)?: None Help needed standing up from a chair using your arms (e.g., wheelchair or bedside chair)?: None Help needed to walk in hospital room?: None Help needed climbing 3-5 steps with a railing? : None 6 Click Score: 24    End of Session   Activity Tolerance: Patient tolerated treatment well Patient left: in bed;with call bell/phone within reach Nurse Communication: Mobility status PT Visit Diagnosis: Muscle weakness (generalized) (M62.81)    Time: 9090-3014 PT Time Calculation (min) (ACUTE ONLY): 16 min   Charges:   PT Evaluation $PT Eval Low Complexity: 1 Low          Minna Merritts, PT, MPT   Percell Locus 02/02/2022, 1:05 PM

## 2022-02-03 DIAGNOSIS — N10 Acute tubulo-interstitial nephritis: Secondary | ICD-10-CM | POA: Diagnosis not present

## 2022-02-03 LAB — GLUCOSE, CAPILLARY
Glucose-Capillary: 264 mg/dL — ABNORMAL HIGH (ref 70–99)
Glucose-Capillary: 193 mg/dL — ABNORMAL HIGH (ref 70–99)
Glucose-Capillary: 254 mg/dL — ABNORMAL HIGH (ref 70–99)

## 2022-02-03 MED ORDER — CEFPODOXIME PROXETIL 200 MG PO TABS: 200.0000 mg | ORAL_TABLET | Freq: Two times a day (BID) | ORAL | 0 refills | Status: AC

## 2022-02-03 NOTE — Discharge Summary (Signed)
Physician Discharge Summary  LACRESIA DARWISH HKG:[id number] DOB: [birth year] DOA: 01/31/2022  PCP: Wenda Low, MD  Admit date: 01/31/2022 Discharge date: 02/03/2022  Admitted From: home Disposition:  home  Recommendations for Outpatient Follow-up:  Follow up with PCP in 1-2 weeks  Home Health: none Equipment/Devices: none  Discharge Condition: stable CODE STATUS: Full code  HPI: Per admitting MD, Briana Foster is a 77 y.o. 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  Patient had just recently been hospitalized from 12/27-12/29 with acute hypoxic respiratory failure secondary to COVID-19 infection.  Patient has been treated with remdesivir and steroids with improvement in symptoms able to be weaned off oxygen to discharge.  After getting home patient reports having sharp pains in the right lower quadrant of her abdomen along with some referred pain in her back.  Noted associated symptoms of nausea, vomiting, and decreased appetite.  She reports having urinary frequency getting up 6-7 times per night usually, but denied any discomfort with urinating.  Patient reports still having a cough and some shortness of breath with ambulating, but felt symptoms are better than when she was initially admitted with COVID-19 last week..  Last hospitalized with pyelonephritis back in October 2022 where patient was noted to have right-sided hydronephrosis.  At discharge she reports being told that she would be set up with appointment with urology, but this was never done.  She reports calling the office, but had not been able to be seen yet. In the emergency department patient was noted to be hypotensive with blood pressures as low as 86/54 with improvement with IV fluids.  Labs from yesterday significant for  WBC 16.4, hemoglobin 16.4, BUN 26, creatinine 1.41, glucose 336, anion gap 14, lipase 78, and lactic acid 2.2-> 1.3.  Urinalysis significant for large hemoglobin, small leukocytes, elevated specific gravity >1.046, few bacteria, greater than 50 RBC/hpf, and 21-50 WBCs.  CT scan abdomen pelvis concerning for pyelonephritis with nonobstructing right renal calculus.  Urine cultures were obtained.  Patient received at least 500 mL bolus of normal saline IV fluids, IV, and Rocephin.  Hospital Course / Discharge diagnoses: Principal Problem:   Acute pyelonephritis Active Problems:   Leukocytosis   Transient hypotension   AKI (acute kidney injury) ([place])   Persistent atrial fibrillation (HCC)   Chronic anticoagulation   Uncontrolled type 2 diabetes mellitus with hyperglycemia, without long-term current use of insulin (HCC)   Weakness   Chronic diastolic CHF (congestive heart failure) (HCC)   S/P TAVR (transcatheter aortic valve replacement)   Nephrolithiasis   History of COVID-19   Restless leg syndrome   rincipal problem Pyelonephritis - patient was admitted to the hospital with significant leukocytosis with a white count of 16.4 K, and 2 days prior it was 8.3.  She has right flank pain and right groin pain as well as increased frequency, clinically consistent with pyelonephritis.  She was placed on ceftriaxone and white count is now improving and she has improved symptoms.  She is able to tolerate oral intake and currently feels back to baseline.  Urinalysis positive for infection however urine cultures were negative.  She was transition to cefpodoxime and will complete 5 additional days for total of a 7-day course.  She will be discharged home in stable condition  Active problems Acute kidney injury -Patient presented with creatinine  elevated up to 1.41 with BUN 26.  Creatinine has improved with fluids Persistent atrial fibrillation on chronic anticoagulation, tachybradycardia syndrome s/p AV  ablation and permanent pacemaker -Patient is s/p [employer] pacemaker and 07/2020.  Currently in a paced rhythm at this time.  As remote check in 10/2021 noted normal device function with VP increased mid-June. Continue digoxin and Eliquis Uncontrolled diabetes mellitus type 2 with hyperglycemia -Last hemoglobin A1c 8.9 on 11/04/2021. Chronic diastolic CHF - Last echocardiogram noted EF to be 60 to 65% with indeterminate diastolic parameters, and status post TAVR with mean aortic gradient measuring 7 mmHg back in 05/2021.  Euvolemic on discharge  CAD, AS s/p TAVR -stable, no apparent issues Nephrolithiasis -Nonobstructing right renal calculus was seen, but hydronephrosis no longer present. History of COVID-19 -Patient had just recently been admitted in the hospital from 12/27-12/29/2023 with COVID-19 treated with remdesivir and steroids due to hypoxia requiring 2 L of oxygen.and monitor.  She is better from the standpoint  Sepsis ruled out   Discharge Instructions  Discharge Instructions     Ambulatory referral to Urology   Complete by: As directed    Nephrolithiasis      Allergies as of 02/03/2022       Reactions   Bee Venom Shortness Of Breath, Rash   Claritin [loratadine] Itching, Palpitations   Wasp Venom Other (See Comments), Anaphylaxis   Apricot Flavor Swelling   Eyes swell shut   Atorvastatin Itching   Eye swelling Other reaction(s): HA        Medication List     STOP taking these medications    predniSONE 50 MG tablet Commonly known as: DELTASONE       TAKE these medications    acetaminophen 500 MG tablet Commonly known as: TYLENOL Take 500-1,000 mg by mouth every 8 (eight) hours as needed for moderate pain.   albuterol 108 (90 Base) MCG/ACT inhaler Commonly known as: VENTOLIN HFA Inhale 2 puffs into the lungs every 4 (four) hours as needed for wheezing or shortness of breath.   amLODipine 10 MG tablet Commonly known as: NORVASC Take 1 tablet (10 mg  total) by mouth daily.   apixaban 5 MG Tabs tablet Commonly known as: Eliquis Take 1 tablet (5 mg total) by mouth 2 (two) times daily.   benzonatate 200 MG capsule Commonly known as: TESSALON Take 200 mg by mouth 3 (three) times daily as needed for cough.   cefpodoxime 200 MG tablet Commonly known as: VANTIN Take 1 tablet (200 mg total) by mouth 2 (two) times daily for 5 days.   cetirizine 10 MG tablet Commonly known as: ZYRTEC Take 10 mg by mouth daily as needed for allergies.   diclofenac Sodium 1 % Gel Commonly known as: VOLTAREN Apply 2 g topically 4 (four) times daily.   digoxin 0.125 MG tablet Commonly known as: LANOXIN Take 1 tablet (0.125 mg total) by mouth daily.   diltiazem 180 MG 24 hr capsule Commonly known as: DILACOR XR Take 180 mg by mouth daily.   docusate sodium 100 MG capsule Commonly known as: COLACE Take 1 capsule (100 mg total) by mouth 2 (two) times daily. What changed:  when to take this reasons to take this   guaiFENesin-dextromethorphan 100-10 MG/5ML syrup Commonly known as: ROBITUSSIN DM Take 10 mLs by mouth every 4 (four) hours as needed for cough.   meclizine 25 MG tablet Commonly known as: ANTIVERT Take 25 mg by mouth 2 (two) times daily as needed (vertigo).  multivitamin with minerals tablet Take 1 tablet by mouth daily.   rOPINIRole 4 MG tablet Commonly known as: REQUIP Take 4 mg by mouth at bedtime.   VITAMIN B-12 PO Take 1 tablet by mouth daily.        Follow-up Information     Georgann Housekeeper, MD Follow up.   Specialty: Internal Medicine Contact information: [address] [place] [address] [phone]                 Consultations: none  Procedures/Studies:  CT ABDOMEN PELVIS W CONTRAST  Result Date: 02/01/2022 CLINICAL DATA:  Lower right abdominal pain. EXAM: CT ABDOMEN AND PELVIS WITH CONTRAST TECHNIQUE: Multidetector CT imaging of the abdomen and pelvis was performed using the  standard protocol following bolus administration of intravenous contrast. RADIATION DOSE REDUCTION: This exam was performed according to the departmental dose-optimization program which includes automated exposure control, adjustment of the mA and/or kV according to patient size and/or use of iterative reconstruction technique. CONTRAST:  43mL OMNIPAQUE IOHEXOL 350 MG/ML SOLN COMPARISON:  11/07/2021. FINDINGS: Lower chest: The heart is borderline enlarged. A TAVR stent and pacemaker leads are noted. There is calcification of the mitral valve annulus. There is atelectasis at the lung bases. Hepatobiliary: A stable subcentimeter hypodensity is present in the right lobe of the liver, likely cyst or hemangioma. The gallbladder is without stones. No biliary ductal dilatation. Pancreas: Pancreatic atrophy. No pancreatic ductal dilatation or surrounding inflammatory changes. Spleen: Normal in size without focal abnormality. Adrenals/Urinary Tract: No adrenal nodule or mass. A delayed nephrogram is noted in the lower pole the right kidney. There is patchy hypoenhancement in the upper pole of the right kidney. 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 Nonobstructive renal calculus in the upper pole. Perinephric and periureteral fat stranding are present on the right with mild hydroureteronephrosis in the lower pole and urothelial enhancement. No ureteral calculus is seen. Few scattered subcentimeter hypodensities are present in the kidneys which are too small to further characterize. The bladder is unremarkable. No renal calculus or obstructive uropathy on the left. Stomach/Bowel: Stomach is within normal limits. Appendix appears normal. No evidence of bowel wall thickening, distention, or inflammatory changes. No free air or pneumatosis. Scattered diverticula are present along the sigmoid colon without evidence of diverticulitis. Vascular/Lymphatic: Aortic atherosclerosis. No enlarged  abdominal or pelvic lymph nodes. Reproductive: Uterus and bilateral adnexa are unremarkable. Other: No abdominopelvic ascites. Small fat containing umbilical hernia. Musculoskeletal: Degenerative changes in the thoracolumbar spine. No acute osseous abnormality. IMPRESSION: 1. 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 No obstructing calculus is identified. There is a delayed nephrogram on the right in the lower pole with patchy hypoenhancement in the upper pole with perinephric and periureteral fat stranding, concerning for pyelonephritis. 2. Nonobstructive right renal calculus. 3. Colonic diverticulosis without diverticulitis. 4. Multi-vessel coronary artery calcifications. 5. Aortic atherosclerosis. Electronically Signed   By: Thornell Sartorius M.D.   On: 02/01/2022 01:25   DG Chest Portable 1 View  Result Date: 01/31/2022 CLINICAL DATA:  Cough patient was diagnosed with COVID last Wednesday. EXAM: PORTABLE CHEST 1 VIEW COMPARISON:  Chest radiograph dated January 26, 2022 FINDINGS: The heart size and mediastinal contours are within normal limits. Trans aortic valve replacement. Left access pacemaker with leads in the right ventricle. Both lungs are clear. The visualized skeletal structures are unremarkable. IMPRESSION: No active disease. Electronically Signed   By: Larose Hires  D.O.   On: 01/31/2022 22:16   CT Head Wo Contrast  Result Date: 01/26/2022 CLINICAL DATA:  Head trauma, minor (Age >= 65y) EXAM: CT HEAD WITHOUT CONTRAST TECHNIQUE: Contiguous axial images were obtained from the base of the skull through the vertex without intravenous contrast. RADIATION DOSE REDUCTION: This exam was performed according to the departmental dose-optimization program which includes automated exposure control, adjustment of the mA and/or kV according to patient size and/or use of iterative reconstruction technique. COMPARISON:  CT head 08/31/2021. FINDINGS:  Brain: No evidence of acute infarction, hemorrhage, hydrocephalus, extra-axial collection or mass lesion/mass effect. Cerebral atrophy. Vascular: No hyperdense vessel identified. Skull: No acute fracture. Sinuses/Orbits: Clear sinuses. Other: No mastoid effusions. IMPRESSION: No evidence of acute intracranial abnormality. Electronically Signed   By: Feliberto Harts M.D.   On: 01/26/2022 08:42   DG Chest 2 View  Result Date: 01/26/2022 CLINICAL DATA:  77 year old female with history of shortness of breath, weakness and cough. EXAM: CHEST - 2 VIEW COMPARISON:  Chest x-ray 11/17/2021. FINDINGS: Lung volumes are slightly low. No consolidative airspace disease. No pleural effusions. No pneumothorax. Cephalization of the pulmonary vasculature, without frank pulmonary edema. Heart size is mildly enlarged. Upper mediastinal contours are within normal limits. Atherosclerotic calcifications are noted in the thoracic aorta. Left-sided pacemaker device in place with lead tip projecting over the expected location of the right ventricle. Status post TAVR. Severe calcifications of the mitral annulus again noted. IMPRESSION: 1. Cardiomegaly with pulmonary venous congestion, but no frank pulmonary edema. 2. Aortic atherosclerosis. Electronically Signed   By: Trudie Reed M.D.   On: 01/26/2022 06:47    Subjective: - no chest pain, shortness of breath, no abdominal pain, nausea or vomiting.   Discharge Exam: BP (!) 155/67 (BP Location: Left Arm)   Pulse 61   Temp 98.1 F (36.7 C) (Oral)   Resp 19   Ht 5\' 8"  (1.727 m)   Wt 88.4 kg   SpO2 96%   BMI 29.63 kg/m   General: Pt is alert, awake, not in acute distress Cardiovascular: RRR, S1/S2 +, no rubs, no gallops Respiratory: CTA bilaterally, no wheezing, no rhonchi Abdominal: Soft, NT, ND, bowel sounds + Extremities: no edema, no cyanosis    The results of significant diagnostics from this hospitalization (including imaging, microbiology, ancillary and  laboratory) are listed below for reference.     Microbiology: Recent Results (from the past 240 hour(s))  Resp panel by RT-PCR (RSV, Flu A&B, Covid) Anterior Nasal Swab     Status: Abnormal   Collection Time: 01/26/22  7:35 AM   Specimen: Anterior Nasal Swab  Result Value Ref Range Status   SARS Coronavirus 2 by RT PCR POSITIVE (A) NEGATIVE Final    Comment: (NOTE) SARS-CoV-2 target nucleic acids are DETECTED.  The SARS-CoV-2 RNA is generally detectable in upper respiratory specimens during the acute phase of infection. Positive results are indicative of the presence of the identified virus, but do not rule out bacterial infection or co-infection with other pathogens not detected by the test. Clinical correlation with patient history and other diagnostic information is necessary to determine patient infection status. The expected result is Negative.  Fact Sheet for Patients: 01/28/22  Fact Sheet for Healthcare Providers: BloggerCourse.com  This test is not yet approved or cleared by the [place] FDA and  has been authorized for detection and/or diagnosis of SARS-CoV-2 by FDA under an Emergency Use Authorization (EUA).  This EUA will remain in effect (meaning this test can be  used) for the duration of  the COVID-19 declaration under Section 564(b)(1) of the A ct, 21 U.S.C. section 360bbb-3(b)(1), unless the authorization is terminated or revoked sooner.     Influenza A by PCR NEGATIVE NEGATIVE Final   Influenza B by PCR NEGATIVE NEGATIVE Final    Comment: (NOTE) The Xpert Xpress SARS-CoV-2/FLU/RSV plus assay is intended as an aid in the diagnosis of influenza from Nasopharyngeal swab specimens and should not be used as a sole basis for treatment. Nasal washings and aspirates are unacceptable for Xpert Xpress SARS-CoV-2/FLU/RSV testing.  Fact Sheet for Patients: BloggerCourse.com  Fact  Sheet for Healthcare Providers: SeriousBroker.it  This test is not yet approved or cleared by the [place] FDA and has been authorized for detection and/or diagnosis of SARS-CoV-2 by FDA under an Emergency Use Authorization (EUA). This EUA will remain in effect (meaning this test can be used) for the duration of the COVID-19 declaration under Section 564(b)(1) of the Act, 21 U.S.C. section 360bbb-3(b)(1), unless the authorization is terminated or revoked.     Resp Syncytial Virus by PCR NEGATIVE NEGATIVE Final    Comment: (NOTE) Fact Sheet for Patients: BloggerCourse.com  Fact Sheet for Healthcare Providers: [employer]ious[employer]ker.it  This test is not yet approved or cleared by the [place] FDA and has been authorized for detection and/or diagnosis of SARS-CoV-2 by FDA under an Emergency Use Authorization (EUA). This EUA will remain in effect (meaning this test can be used) for the duration of the COVID-19 declaration under Section 564(b)(1) of the Act, 21 U.S.C. section 360bbb-3(b)(1), unless the authorization is terminated or revoked.  Performed at [facility], [address], [place], [place] [address]   Blood Culture (routine x 2)     Status: None   Collection Time: 01/26/22  7:36 AM   Specimen: BLOOD  Result Value Ref Range Status   Specimen Description BLOOD  Final   Special Requests   Final    BOTTLES DRAWN AEROBIC AND ANAEROBIC Blood Culture adequate volume   Culture   Final    NO GROWTH 5 DAYS Performed at [facility], [address], [place], [place] [address]    Report Status 01/31/2022 FINAL  Final  Blood Culture (routine x 2)     Status: None   Collection Time: 01/26/22  7:41 AM   Specimen: BLOOD  Result Value Ref Range Status   Specimen Description BLOOD RIGHT ANTECUBITAL  Final   Special Requests   Final    BOTTLES DRAWN AEROBIC AND ANAEROBIC Blood Culture adequate  volume   Culture   Final    NO GROWTH 5 DAYS Performed at [facility], [address], [place], [place] [address]    Report Status 01/31/2022 FINAL  Final  Urine Culture     Status: Abnormal   Collection Time: 01/26/22 11:55 AM   Specimen: In/Out Cath Urine  Result Value Ref Range Status   Specimen Description IN/OUT CATH URINE  Final   Special Requests   Final    NONE Performed at [facility], [address] [place], [place] [address]    Culture MULTIPLE SPECIES PRESENT, SUGGEST RECOLLECTION (A)  Final   Report Status 01/27/2022 FINAL  Final  Urine Culture     Status: Abnormal   Collection Time: 02/01/22  1:31 AM   Specimen: Urine, Clean Catch  Result Value Ref Range Status   Specimen Description URINE, CLEAN CATCH  Final   Special Requests NONE  Final  Culture (A)  Final    <10,000 COLONIES/mL INSIGNIFICANT GROWTH Performed at [facility] [address], [place], [place] [address]    Report Status 02/02/2022 FINAL  Final     Labs: Basic Metabolic Panel: Recent Labs  Lab 01/28/22 0651 01/31/22 2151 01/31/22 2200 02/01/22 0846  NA 137 131* 132* 135  K 3.8 4.0 4.1 3.5  CL 103 95* 99 105  CO2 23 22  --  21*  GLUCOSE 204* 336* 325* 241*  BUN 27* 26* 32* 25*  CREATININE 0.74 1.41* 1.20* 1.05*  CALCIUM 9.3 9.1  --  7.5*  MG  --   --   --  1.8   Liver Function Tests: Recent Labs  Lab 01/28/22 0651 01/31/22 2151 02/01/22 0846  AST 22 22 13*  ALT 27 27 19   ALKPHOS 54 67 48  BILITOT 0.6 0.6 0.6  PROT 6.7 7.1 5.2*  ALBUMIN 3.4* 3.8 2.7*   CBC: Recent Labs  Lab 01/28/22 0651 01/31/22 2151 01/31/22 2200 02/01/22 0615  WBC 8.3 16.4*  --  12.8*  NEUTROABS 6.4 12.4*  --  9.6*  HGB 12.3 16.4* 16.7* 13.9  HCT 35.9* 46.2* 49.0* 41.2  MCV 92.1 89.5  --  91.4  PLT 166 240  --  231   CBG: Recent Labs  Lab 02/02/22 0923 02/02/22 1207 02/02/22 1638 02/02/22 1950 02/03/22 0736  GLUCAP 264* 129* 221* 245* 193*   Hgb A1c No results  for input(s): "HGBA1C" in the last 72 hours. Lipid Profile No results for input(s): "CHOL", "HDL", "LDLCALC", "TRIG", "CHOLHDL", "LDLDIRECT" in the last 72 hours. Thyroid function studies No results for input(s): "TSH", "T4TOTAL", "T3FREE", "THYROIDAB" in the last 72 hours.  Invalid input(s): "FREET3" Urinalysis    Component Value Date/Time   COLORURINE YELLOW 02/01/2022 0344   APPEARANCEUR HAZY (A) 02/01/2022 0344   LABSPEC >1.046 (H) 02/01/2022 0344   PHURINE 5.0 02/01/2022 0344   GLUCOSEU 150 (A) 02/01/2022 0344   HGBUR LARGE (A) 02/01/2022 0344   BILIRUBINUR NEGATIVE 02/01/2022 0344   KETONESUR NEGATIVE 02/01/2022 0344   PROTEINUR 100 (A) 02/01/2022 0344   NITRITE NEGATIVE 02/01/2022 0344   LEUKOCYTESUR SMALL (A) 02/01/2022 0344    FURTHER DISCHARGE INSTRUCTIONS:   Get Medicines reviewed and adjusted: Please take all your medications with you for your next visit with your Primary MD   Laboratory/radiological data: Please request your Primary MD to go over all hospital tests and procedure/radiological results at the follow up, please ask your Primary MD to get all Hospital records sent to his/her office.   In some cases, they will be blood work, cultures and biopsy results pending at the time of your discharge. Please request that your primary care M.D. goes through all the records of your hospital data and follows up on these results.   Also Note the following: If you experience worsening of your admission symptoms, develop shortness of breath, life threatening emergency, suicidal or homicidal thoughts you must seek medical attention immediately by calling 911 or calling your MD immediately  if symptoms less severe.   You must read complete instructions/literature along with all the possible adverse reactions/side effects for all the Medicines you take and that have been prescribed to you. Take any new Medicines after you have completely understood and accpet all the possible  adverse reactions/side effects.    Do not drive when taking Pain medications or sleeping medications (Benzodaizepines)   Do not take more than prescribed Pain, Sleep and Anxiety Medications.  It is not advisable to combine anxiety,sleep and pain medications without talking with your primary care practitioner   Special Instructions: If you have smoked or chewed Tobacco  in the last 2 yrs please stop smoking, stop any regular Alcohol  and or any Recreational drug use.   Wear Seat belts while driving.   Please note: You were cared for by a hospitalist during your hospital stay. Once you are discharged, your primary care physician will handle any further medical issues. Please note that NO REFILLS for any discharge medications will be authorized once you are discharged, as it is imperative that you return to your primary care physician (or establish a relationship with a primary care physician if you do not have one) for your post hospital discharge needs so that they can reassess your need for medications and monitor your lab values.  Time coordinating discharge: 35 minutes  SIGNED:  Pamella Pert, MD, PhD 02/03/2022, 9:28 AM

## 2022-02-03 NOTE — TOC Transition Note (Signed)
Transition of Care [facility]) - CM/SW Discharge Note   Patient Details  Name: Briana Foster MRN: [id number] Date of Birth: [birth year]  [facility] of [facility] CM/SW Contact:  Tom-Johnson, Renea Ee, RN Phone Number: 02/03/2022, 10:08 AM   Clinical Narrative:     Patient is scheduled for discharge today. Home health info on AVS. Family to transport at discharge. No further TOC needs noted.        Final next level of care: Home w Home Health Services Barriers to Discharge: Barriers Resolved   Patient Goals and CMS Choice CMS Medicare.gov Compare Post Acute Care list provided to:: Patient Choice offered to / list presented to : Patient  Discharge Placement                  Patient to be transferred to facility by: Family      Discharge Plan and Services Additional resources added to the After Visit Summary for                  DME Arranged: N/A DME Agency: NA       HH Arranged: PT HH Agency: [facility]        Social Determinants of Health (SDOH) Interventions [place]: No Food Insecurity (02/01/2022)  Housing: Low Risk  (02/01/2022)  Transportation Needs: No Transportation Needs (02/01/2022)  Utilities: Not At Risk (02/01/2022)  Depression (PHQ2-9): Medium Risk (03/22/2018)  Tobacco Use: Low Risk  (01/31/2022)     Readmission Risk Interventions    11/05/2021    3:55 PM  Readmission Risk Prevention Plan  Post Dischage Appt Complete  Medication Screening Complete  Transportation Screening Complete

## 2022-02-16 ENCOUNTER — Other Ambulatory Visit: Payer: Self-pay

## 2022-02-16 ENCOUNTER — Ambulatory Visit: Payer: Medicare HMO | Admitting: Urology

## 2022-02-16 ENCOUNTER — Telehealth: Payer: Self-pay

## 2022-02-16 ENCOUNTER — Ambulatory Visit (HOSPITAL_COMMUNITY)
Admission: RE | Admit: 2022-02-16 | Discharge: 2022-02-16 | Disposition: A | Discharge status: Home / Self Care | Payer: Medicare HMO | Source: Ambulatory Visit | Attending: Urology | Admitting: Urology

## 2022-02-16 VITALS — BP 165/90 | HR 76

## 2022-02-16 DIAGNOSIS — Z0389 Encounter for observation for other suspected diseases and conditions ruled out: Secondary | ICD-10-CM | POA: Diagnosis not present

## 2022-02-16 DIAGNOSIS — Z950 Presence of cardiac pacemaker: Secondary | ICD-10-CM | POA: Diagnosis not present

## 2022-02-16 DIAGNOSIS — N200 Calculus of kidney: Secondary | ICD-10-CM | POA: Insufficient documentation

## 2022-02-16 DIAGNOSIS — N10 Acute tubulo-interstitial nephritis: Secondary | ICD-10-CM

## 2022-02-16 LAB — URINALYSIS, ROUTINE W REFLEX MICROSCOPIC
Bilirubin, UA: NEGATIVE
Glucose, UA: NEGATIVE
Ketones, UA: NEGATIVE
Leukocytes,UA: NEGATIVE
Nitrite, UA: NEGATIVE
RBC, UA: NEGATIVE
Specific Gravity, UA: 1.025 (ref 1.005–1.030)
Urobilinogen, Ur: 0.2 mg/dL (ref 0.2–1.0)
pH, UA: 5 (ref 5.0–7.5)

## 2022-02-16 LAB — MICROSCOPIC EXAMINATION
Bacteria, UA: NONE SEEN
RBC, Urine: NONE SEEN /hpf (ref 0–2)
WBC, UA: NONE SEEN /hpf (ref 0–5)

## 2022-02-16 NOTE — Progress Notes (Signed)
02/16/2022 1:25 PM   Briana Foster 11-29-1945 [id number]  Referring provider: Clydie Braun, MD [address] [place],  [place] [address]  nephrolithiasis   HPI: Ms Briana Foster is a 76yo here for evaluation of nephrolithiasis and pyelonephrosis. She was admitted 02/01/2022 with pyelonephritis on the right. She has intermittent right flank pain which has improved since antibiotic therapy. She previous stone event history. She denies any significant LUTS.    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 Clifton James & Dr. Cornelius Moras   Severe aortic stenosis    Type 2 diabetes mellitus without complication, without long-term current use of insulin (HCC) 04/10/2018   Vitamin D deficiency     Surgical History: Past Surgical History:  Procedure Laterality Date   AV NODE ABLATION N/A 07/14/2021   Procedure: AV NODE ABLATION;  Surgeon: Regan Lemming, MD;  Location: [facility];  Service: Cardiovascular;  Laterality: N/A;   BUNIONECTOMY  20 yrs ago   bil feet   BUNIONECTOMY  11/30/2011   Procedure: BUNIONECTOMY;  Surgeon: Drucilla Schmidt, MD;  Location: [facility];  Service: Orthopedics;  Laterality: Bilateral;  RIGHT FOOT EXCISION OF BUNIONETTE AND PARTIAL   PROXIMAL PHALANGECTOMY OF 5TH TOE LEFT FOOT FUNK BUNIONECTOMY,EXCISION OF BUNIONETTE    CARDIOVERSION N/A 11/05/2019   Procedure: CARDIOVERSION;   Surgeon: Lewayne Bunting, MD;  Location: [facility] ENDOSCOPY;  Service: Cardiovascular;  Laterality: N/A;   CARDIOVERSION N/A 12/05/2019   Procedure: CARDIOVERSION;  Surgeon: Parke Poisson, MD;  Location: [facility] ENDOSCOPY;  Service: Cardiovascular;  Laterality: N/A;   COLONOSCOPY WITH PROPOFOL N/A 08/07/2012   Procedure: COLONOSCOPY WITH PROPOFOL;  Surgeon: Charolett Bumpers, MD;  Location: [facility] ENDOSCOPY;  Service: Endoscopy;  Laterality: N/A;   MOUTH SURGERY     teeth extractions   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   Procedure: PACEMAKER IMPLANT;  Surgeon: Regan Lemming, MD;  Location: [facility];  Service: Cardiovascular;  Laterality: N/A;   RIGHT/LEFT HEART CATH AND CORONARY ANGIOGRAPHY N/A 06/17/2020   Procedure: RIGHT/LEFT HEART CATH AND CORONARY ANGIOGRAPHY;  Surgeon: Lyn Records, MD;  Location: [facility];  Service: Cardiovascular;  Laterality: N/A;   TONSILLECTOMY     TRANSCATHETER AORTIC VALVE REPLACEMENT, TRANSFEMORAL N/A 06/30/2020   Procedure: TRANSCATHETER AORTIC VALVE REPLACEMENT, TRANSFEMORAL;  Surgeon: Kathleene Hazel, MD;  Location: [facility];  Service: Open Heart Surgery;  Laterality: N/A;   TUBAL LIGATION     WRIST SURGERY      Home Medications:  Allergies as of 02/16/2022       Reactions   Bee Venom Shortness Of Breath, Rash   Claritin [loratadine] Itching, Palpitations   Wasp Venom Other (See Comments), Anaphylaxis   Apricot Flavor Swelling   Eyes swell  shut   Atorvastatin Itching   Eye swelling Other reaction(s): HA        Medication List        Accurate as of February 16, 2022  1:25 PM. If you have any questions, ask your nurse or doctor.          acetaminophen 500 MG tablet Commonly known as: TYLENOL Take 500-1,000 mg by mouth every 8 (eight) hours as needed for moderate pain.   albuterol 108 (90 Base) MCG/ACT inhaler Commonly known as: VENTOLIN HFA Inhale 2 puffs into the lungs every 4 (four) hours as needed for  wheezing or shortness of breath.   amLODipine 10 MG tablet Commonly known as: NORVASC Take 1 tablet (10 mg total) by mouth daily.   apixaban 5 MG Tabs tablet Commonly known as: Eliquis Take 1 tablet (5 mg total) by mouth 2 (two) times daily.   benzonatate 200 MG capsule Commonly known as: TESSALON Take 200 mg by mouth 3 (three) times daily as needed for cough.   cetirizine 10 MG tablet Commonly known as: ZYRTEC Take 10 mg by mouth daily as needed for allergies.   diclofenac Sodium 1 % Gel Commonly known as: VOLTAREN Apply 2 g topically 4 (four) times daily.   digoxin 0.125 MG tablet Commonly known as: LANOXIN Take 1 tablet (0.125 mg total) by mouth daily.   diltiazem 180 MG 24 hr capsule Commonly known as: DILACOR XR Take 180 mg by mouth daily.   docusate sodium 100 MG capsule Commonly known as: COLACE Take 1 capsule (100 mg total) by mouth 2 (two) times daily. What changed:  when to take this reasons to take this   guaiFENesin-dextromethorphan 100-10 MG/5ML syrup Commonly known as: ROBITUSSIN DM Take 10 mLs by mouth every 4 (four) hours as needed for cough.   meclizine 25 MG tablet Commonly known as: ANTIVERT Take 25 mg by mouth 2 (two) times daily as needed (vertigo).   multivitamin with minerals tablet Take 1 tablet by mouth daily.   rOPINIRole 4 MG tablet Commonly known as: REQUIP Take 4 mg by mouth at bedtime.   VITAMIN B-12 PO Take 1 tablet by mouth daily.        Allergies:  Allergies  Allergen Reactions   Bee Venom Shortness Of Breath and Rash   Claritin [Loratadine] Itching and Palpitations   Wasp Venom Other (See Comments) and Anaphylaxis   Apricot Flavor Swelling    Eyes swell shut   Atorvastatin Itching    Eye swelling Other reaction(s): HA    Family History: Family History  Problem Relation Age of Onset   Heart disease Mother    Stroke Mother    Cancer Father        Prostate    Social History:  reports that she has never  smoked. She has never used smokeless tobacco. She reports that she does not drink alcohol and does not use drugs.  ROS: All other review of systems were reviewed and are negative except what is noted above in HPI  Physical Exam: BP (!) 165/90   Pulse 76   Constitutional:  Alert and oriented, No acute distress. HEENT: [place] AT, moist mucus membranes.  Trachea midline, no masses. Cardiovascular: No clubbing, cyanosis, or edema. Respiratory: Normal respiratory effort, no increased work of breathing. GI: Abdomen is soft, nontender, nondistended, no abdominal masses GU: No CVA tenderness.  Lymph: No cervical or inguinal lymphadenopathy. Skin: No rashes, bruises or suspicious lesions. Neurologic: Grossly intact, no focal deficits, moving  all 4 extremities. Psychiatric: Normal mood and affect.  Laboratory Data: Lab Results  Component Value Date   WBC 12.8 (H) 02/01/2022   HGB 13.9 02/01/2022   HCT 41.2 02/01/2022   MCV 91.4 02/01/2022   PLT 231 02/01/2022    Lab Results  Component Value Date   CREATININE 1.05 (H) 02/01/2022    No results found for: "PSA"  No results found for: "TESTOSTERONE"  Lab Results  Component Value Date   HGBA1C 8.9 (H) 11/04/2021    Urinalysis    Component Value Date/Time   COLORURINE YELLOW 02/01/2022 0344   APPEARANCEUR HAZY (A) 02/01/2022 0344   LABSPEC >1.046 (H) 02/01/2022 0344   PHURINE 5.0 02/01/2022 0344   GLUCOSEU 150 (A) 02/01/2022 0344   HGBUR LARGE (A) 02/01/2022 0344   BILIRUBINUR NEGATIVE 02/01/2022 0344   KETONESUR NEGATIVE 02/01/2022 0344   PROTEINUR 100 (A) 02/01/2022 0344   NITRITE NEGATIVE 02/01/2022 0344   LEUKOCYTESUR SMALL (A) 02/01/2022 0344    Lab Results  Component Value Date   BACTERIA FEW (A) 02/01/2022    Pertinent Imaging: CT 02/01/2022 and KUB today: Images reviewed and discussed with the patient No results found for this or any previous visit.  No results found for this or any previous visit.  No results  found for this or any previous visit.  No results found for this or any previous visit.  No results found for this or any previous visit.  No valid procedures specified. No results found for this or any previous visit.  Results for orders placed during the hospital encounter of 11/07/21  CT Renal Stone Study  Narrative CLINICAL DATA:  Right flank pain, right hydronephrosis seen in the sonogram  EXAM: CT ABDOMEN AND PELVIS WITHOUT CONTRAST  TECHNIQUE: Multidetector CT imaging of the abdomen and pelvis was performed following the standard protocol without IV contrast.  RADIATION DOSE REDUCTION: This exam was performed according to the departmental dose-optimization program which includes automated exposure control, adjustment of the mA and/or kV according to patient size and/or use of iterative reconstruction technique.  COMPARISON:  Previous CT done on 11/04/2021 and sonogram done earlier today  FINDINGS: Lower chest: Small patchy infiltrate is seen in left lower lobe. Coronary artery calcifications are seen. There is prosthetic aortic valve. Dense calcification is seen in mitral annulus. Cardiac pacer leads are noted in place.  Hepatobiliary: In image 18 of series 3, there is subcentimeter low-density in the right lobe of liver, possibly cysts or hemangiomas. There is no dilation of bile ducts. Gallbladder is not distended.  Pancreas: No focal abnormalities are seen.  Spleen: Unremarkable.  Adrenals/Urinary Tract: Adrenals are unremarkable. There is mild right hydronephrosis. There is mild to moderate dilation of proximal course of right ureter. There is mild right perinephric stranding. There are 2 small calcific densities in the upper pole of right kidney. Possible tiny calcifications are seen in the lower pole of left kidney. There is no demonstrable opaque calculus in the course of the right ureter. There are multiple round calcifications in both sides of  pelvis, possibly vascular calcifications. There is no hydronephrosis on the left side.  Stomach/Bowel: Stomach is unremarkable. Small bowel loops are not dilated. Appendix is unremarkable. There is no significant wall thickening in colon. Few diverticula are seen in colon without signs of focal diverticulitis.  Vascular/Lymphatic: Scattered arterial calcifications are seen.  Reproductive: Uterus is unremarkable. There is 1.5 cm low-density in right side of pelvis, possibly ovarian follicle.  Other: There is no  ascites or pneumoperitoneum. Umbilical hernia containing fat is seen.  Musculoskeletal: Degenerative changes are noted in lumbar spine with disc space narrowing, bony spurs and facet hypertrophy at multiple levels. There is encroachment of neural foramina from L3-S1 levels.  IMPRESSION: There is mild to moderate right hydronephrosis. There is right perinephric stranding. There is no demonstrable opaque calculus in the course of the right ureter. Findings may suggest recent passage of ureteral calculus or non opaque calculus in the distal right ureter. There are few small calcifications in both kidneys each measuring less than 3 mm suggesting renal stones. There is right perinephric stranding which may be due to ureteric obstruction or superimposed infection.  There is no evidence of intestinal obstruction or pneumoperitoneum. Appendix is not dilated. Scattered diverticula are seen in colon without signs of focal diverticulitis.  Lumbar spondylosis. Small patchy infiltrate in left lower lung fields may suggest atelectasis/pneumonia.  Other findings as described in the body of the report.   Electronically Signed By: Elmer Picker M.D. On: 11/07/2021 12:53   Assessment & Plan:    1. Kidney stones -followup 6 months with a renal US - Urinalysis, Routine w reflex microscopic  2. Acute pyelonephritis -resolved   No follow-ups on file.  Nicolette Bang,  MD  [facility] [place]

## 2022-02-16 NOTE — Telephone Encounter (Signed)
Made patient aware that she need to get a KUB done before her appt today. Patient voiced understanding

## 2022-02-17 DIAGNOSIS — N12 Tubulo-interstitial nephritis, not specified as acute or chronic: Secondary | ICD-10-CM | POA: Diagnosis not present

## 2022-02-17 DIAGNOSIS — I10 Essential (primary) hypertension: Secondary | ICD-10-CM | POA: Diagnosis not present

## 2022-02-17 DIAGNOSIS — J449 Chronic obstructive pulmonary disease, unspecified: Secondary | ICD-10-CM | POA: Diagnosis not present

## 2022-02-17 DIAGNOSIS — F331 Major depressive disorder, recurrent, moderate: Secondary | ICD-10-CM | POA: Diagnosis not present

## 2022-02-17 DIAGNOSIS — I4891 Unspecified atrial fibrillation: Secondary | ICD-10-CM | POA: Diagnosis not present

## 2022-02-17 DIAGNOSIS — I700 Atherosclerosis of aorta: Secondary | ICD-10-CM | POA: Diagnosis not present

## 2022-02-17 DIAGNOSIS — R531 Weakness: Secondary | ICD-10-CM | POA: Diagnosis not present

## 2022-02-17 DIAGNOSIS — N200 Calculus of kidney: Secondary | ICD-10-CM | POA: Diagnosis not present

## 2022-02-17 DIAGNOSIS — E1165 Type 2 diabetes mellitus with hyperglycemia: Secondary | ICD-10-CM | POA: Diagnosis not present

## 2022-02-21 ENCOUNTER — Ambulatory Visit: Payer: Medicare HMO | Attending: Cardiology

## 2022-02-21 DIAGNOSIS — I4821 Permanent atrial fibrillation: Secondary | ICD-10-CM | POA: Diagnosis not present

## 2022-02-22 ENCOUNTER — Encounter: Payer: Self-pay | Admitting: Urology

## 2022-02-22 NOTE — Patient Instructions (Signed)

## 2022-02-23 LAB — CUP PACEART REMOTE DEVICE CHECK
Battery Remaining Longevity: 132 mo
Battery Remaining Percentage: 93 %
Battery Voltage: 3.04 V
Brady Statistic RV Percent Paced: 98 %
Date Time Interrogation Session: 20240122020014
Implantable Lead Connection Status: 753985
Implantable Lead Implant Date: 20220928
Implantable Lead Location: 753860
Implantable Pulse Generator Implant Date: 20220928
Lead Channel Impedance Value: 450 Ohm
Lead Channel Pacing Threshold Amplitude: 0.5 V
Lead Channel Pacing Threshold Pulse Width: 0.4 ms
Lead Channel Sensing Intrinsic Amplitude: 5.4 mV
Lead Channel Setting Pacing Amplitude: 0.75 V
Lead Channel Setting Pacing Pulse Width: 0.4 ms
Lead Channel Setting Sensing Sensitivity: 2 mV
Pulse Gen Model: 1272
Pulse Gen Serial Number: 3937998

## 2022-03-21 ENCOUNTER — Telehealth: Payer: Self-pay | Admitting: Surgery

## 2022-03-21 ENCOUNTER — Encounter (HOSPITAL_COMMUNITY): Payer: Self-pay | Admitting: Emergency Medicine

## 2022-03-21 ENCOUNTER — Emergency Department (HOSPITAL_COMMUNITY)
Admission: EM | Admit: 2022-03-21 | Discharge: 2022-03-21 | Disposition: A | Discharge status: Home / Self Care | Payer: Medicare HMO | Attending: Emergency Medicine | Admitting: Emergency Medicine

## 2022-03-21 ENCOUNTER — Other Ambulatory Visit: Payer: Self-pay

## 2022-03-21 ENCOUNTER — Emergency Department (HOSPITAL_COMMUNITY): Payer: Medicare HMO

## 2022-03-21 DIAGNOSIS — I482 Chronic atrial fibrillation, unspecified: Secondary | ICD-10-CM | POA: Diagnosis not present

## 2022-03-21 DIAGNOSIS — Z952 Presence of prosthetic heart valve: Secondary | ICD-10-CM | POA: Diagnosis not present

## 2022-03-21 DIAGNOSIS — R7989 Other specified abnormal findings of blood chemistry: Secondary | ICD-10-CM | POA: Diagnosis not present

## 2022-03-21 DIAGNOSIS — R079 Chest pain, unspecified: Secondary | ICD-10-CM | POA: Diagnosis not present

## 2022-03-21 DIAGNOSIS — Z7901 Long term (current) use of anticoagulants: Secondary | ICD-10-CM | POA: Diagnosis not present

## 2022-03-21 DIAGNOSIS — R0789 Other chest pain: Secondary | ICD-10-CM | POA: Diagnosis not present

## 2022-03-21 DIAGNOSIS — Z79899 Other long term (current) drug therapy: Secondary | ICD-10-CM | POA: Insufficient documentation

## 2022-03-21 DIAGNOSIS — Z950 Presence of cardiac pacemaker: Secondary | ICD-10-CM | POA: Insufficient documentation

## 2022-03-21 DIAGNOSIS — I10 Essential (primary) hypertension: Secondary | ICD-10-CM | POA: Insufficient documentation

## 2022-03-21 DIAGNOSIS — R609 Edema, unspecified: Secondary | ICD-10-CM | POA: Diagnosis not present

## 2022-03-21 DIAGNOSIS — R778 Other specified abnormalities of plasma proteins: Secondary | ICD-10-CM | POA: Diagnosis not present

## 2022-03-21 DIAGNOSIS — I1A0 Resistant hypertension: Secondary | ICD-10-CM

## 2022-03-21 DIAGNOSIS — J90 Pleural effusion, not elsewhere classified: Secondary | ICD-10-CM | POA: Diagnosis not present

## 2022-03-21 LAB — CBC
HCT: 38.5 % (ref 36.0–46.0)
Hemoglobin: 13.3 g/dL (ref 12.0–15.0)
MCH: 31.7 pg (ref 26.0–34.0)
MCHC: 34.5 g/dL (ref 30.0–36.0)
MCV: 91.7 fL (ref 80.0–100.0)
Platelets: 193 10*3/uL (ref 150–400)
RBC: 4.2 MIL/uL (ref 3.87–5.11)
RDW: 13.1 % (ref 11.5–15.5)
WBC: 5.7 10*3/uL (ref 4.0–10.5)
nRBC: 0 % (ref 0.0–0.2)

## 2022-03-21 LAB — BASIC METABOLIC PANEL
Anion gap: 10 (ref 5–15)
BUN: 16 mg/dL (ref 8–23)
CO2: 21 mmol/L — ABNORMAL LOW (ref 22–32)
Calcium: 9.2 mg/dL (ref 8.9–10.3)
Chloride: 107 mmol/L (ref 98–111)
Creatinine, Ser: 0.78 mg/dL (ref 0.44–1.00)
GFR, Estimated: >60 mL/min (ref >60)
Glucose, Bld: 220 mg/dL — ABNORMAL HIGH (ref 70–99)
Potassium: 3.9 mmol/L (ref 3.5–5.1)
Sodium: 138 mmol/L (ref 135–145)

## 2022-03-21 LAB — TROPONIN I (HIGH SENSITIVITY)
Troponin I (High Sensitivity): 16 ng/L (ref <18)
Troponin I (High Sensitivity): 51 ng/L — ABNORMAL HIGH (ref <18)

## 2022-03-21 MED ORDER — AMLODIPINE BESYLATE 5 MG PO TABS
10.0000 mg | ORAL_TABLET | Freq: Once | ORAL | Status: AC
  Administered 2022-03-21: 10 mg via ORAL
  Filled 2022-03-21: qty 2

## 2022-03-21 MED ORDER — OLMESARTAN MEDOXOMIL 40 MG PO TABS: 40.0000 mg | ORAL_TABLET | Freq: Every day | ORAL | 0 refills | Status: DC

## 2022-03-21 MED ORDER — ACETAMINOPHEN 500 MG PO TABS
1000.0000 mg | ORAL_TABLET | Freq: Once | ORAL | Status: AC
  Administered 2022-03-21: 1000 mg via ORAL
  Filled 2022-03-21: qty 2

## 2022-03-21 NOTE — Telephone Encounter (Signed)
ED RNCM was contacted by ED RN Threasa Beards concerning patient w

## 2022-03-21 NOTE — ED Triage Notes (Signed)
Pt BIB [facility] called out for non-radiating, center chest pain starting at 0400 after waking up to go to the BR, upon EMS arrival v/s 220/110, HR65, RR18, O2 94% RA, EKG paced rhythm, pt reports she is Rx'ed home meds which include BP meds, she has not taken them in 24 hours bc she doesn't like taking medicine and sometimes gets busy and fogets, pt was given 2SL nitro and 324 asa en route, reassessed BP 142/90, pain down to 4/10 after nitro

## 2022-03-21 NOTE — ED Provider Notes (Signed)
[facility] Provider Note   CSN: [id number] Arrival date & time: 03/21/22  [id number]     History  Chief Complaint  Patient presents with   Chest Pain    Briana Foster is a 77 y.o. female.  HPI Presents with left upper chest wall pain.  History is notable for hypertension, arrhythmia.  Patient pacemaker placed end of last year.  She notes that over the weekend, the past 2 days, she has been inconsistently taking her antihypertensives.  Today she awoke with soreness in the left upper chest wall.  No new dyspnea, fever, nausea, vomiting.     Home Medications Prior to Admission medications   Medication Sig Start Date End Date Taking? Authorizing Provider  olmesartan (BENICAR) 40 MG tablet Take 1 tablet (40 mg total) by mouth daily. 03/21/22  Yes Carmin Muskrat, MD  acetaminophen (TYLENOL) 500 MG tablet Take 500-1,000 mg by mouth every 8 (eight) hours as needed for moderate pain.    [provider]  albuterol (VENTOLIN HFA) 108 (90 Base) MCG/ACT inhaler Inhale 2 puffs into the lungs every 4 (four) hours as needed for wheezing or shortness of breath.    [provider]  amLODipine (NORVASC) 10 MG tablet Take 1 tablet (10 mg total) by mouth daily. 11/03/21   [name], NP  apixaban (ELIQUIS) 5 MG TABS tablet Take 1 tablet (5 mg total) by mouth 2 (two) times daily. 06/18/21   Belva Crome, MD  benzonatate (TESSALON) 200 MG capsule Take 200 mg by mouth 3 (three) times daily as needed for cough. Patient not taking: Reported on 02/01/2022 11/12/21   [provider]  cetirizine (ZYRTEC) 10 MG tablet Take 10 mg by mouth daily as needed for allergies.    [provider]  Cyanocobalamin (VITAMIN B-12 PO) Take 1 tablet by mouth daily.    [provider]  diclofenac Sodium (VOLTAREN) 1 % GEL Apply 2 g topically 4 (four) times daily. 11/09/21   Amin, [name], MD  digoxin (LANOXIN) 0.125 MG tablet  Take 1 tablet (0.125 mg total) by mouth daily. 06/11/21   [name], PA-C  diltiazem (DILACOR XR) 180 MG 24 hr capsule Take 180 mg by mouth daily.    [provider]  docusate sodium (COLACE) 100 MG capsule Take 1 capsule (100 mg total) by mouth 2 (two) times daily. Patient taking differently: Take 100 mg by mouth daily as needed for mild constipation. 11/09/21   Amin, [name], MD  guaiFENesin-dextromethorphan (ROBITUSSIN DM) 100-10 MG/5ML syrup Take 10 mLs by mouth every 4 (four) hours as needed for cough. Patient not taking: Reported on 02/01/2022 01/28/22   Geradine Girt, DO  meclizine (ANTIVERT) 25 MG tablet Take 25 mg by mouth 2 (two) times daily as needed (vertigo). 11/12/20   [provider]  Multiple Vitamins-Minerals (MULTIVITAMIN WITH MINERALS) tablet Take 1 tablet by mouth daily.    [provider]  rOPINIRole (REQUIP) 4 MG tablet Take 4 mg by mouth at bedtime.    [provider]      Allergies    Bee venom, Claritin [loratadine], Wasp venom, Apricot flavor, and Atorvastatin    Review of Systems   Review of Systems  All other systems reviewed and are negative.   Physical Exam Updated Vital Signs BP (!) 178/120 (BP Location: Right Arm)   Pulse 63   Temp 98.9 F (37.2 C) (Oral)   Resp 20  Ht 5' 8"$  (1.727 m)   Wt 88.5 kg   SpO2 95%   BMI 29.65 kg/m  Physical Exam Vitals and nursing note reviewed.  Constitutional:      General: She is not in acute distress.    Appearance: She is well-developed.  HENT:     Head: Normocephalic and atraumatic.  Eyes:     Conjunctiva/sclera: Conjunctivae normal.  Cardiovascular:     Rate and Rhythm: Normal rate and regular rhythm.  Pulmonary:     Effort: Pulmonary effort is normal. No respiratory distress.     Breath sounds: Normal breath sounds. No stridor.  Chest:    Abdominal:     General: There is no distension.  Skin:    General: Skin is warm and dry.  Neurological:      Mental Status: She is alert and oriented to person, place, and time.     Cranial Nerves: No cranial nerve deficit.  Psychiatric:        Mood and Affect: Mood normal.     ED Results / Procedures / Treatments   Labs (all labs ordered are listed, but only abnormal results are displayed) Labs Reviewed  BASIC METABOLIC PANEL - Abnormal; Notable for the following components:      Result Value   CO2 21 (*)    Glucose, Bld 220 (*)    All other components within normal limits  TROPONIN I (HIGH SENSITIVITY) - Abnormal; Notable for the following components:   Troponin I (High Sensitivity) 51 (*)    All other components within normal limits  CBC  TROPONIN I (HIGH SENSITIVITY)    EKG EKG Interpretation  Date/Time:  Monday March 21 2022 06:47:33 EST Ventricular Rate:  63 PR Interval:  218 QRS Duration: 155 QT Interval:  507 QTC Calculation: 520 R Axis:   251 Text Interpretation: VENTRICULAR PACED RHYTHM Abnormal ECG Confirmed by Carmin Muskrat [phone]) on 03/21/2022 7:27:10 AM  Radiology DG Chest 2 View  Result Date: 03/21/2022 CLINICAL DATA:  Chest pain EXAM: CHEST - 2 VIEW COMPARISON:  CXR 01/31/22 FINDINGS: Left-sided single lead cardiac device in place with unchanged lead positioning. Postprocedural changes from TAVR. Pleural effusion. No pneumothorax. Focal airspace opacity. Normal cardiac and mediastinal contours. No radiographically apparent displaced rib fractures. Visualized upper abdomen is unremarkable. Degenerative changes at right [facility] joint. Vertebral body heights are maintained. IMPRESSION: No radiographic finding to explain chest pain. Electronically Signed   By: Marin Roberts M.D.   On: 03/21/2022 07:21    Procedures Procedures    Medications Ordered in ED Medications  amLODipine (NORVASC) tablet 10 mg (10 mg Oral Given 03/21/22 1036)  acetaminophen (TYLENOL) tablet 1,000 mg (1,000 mg Oral Given 03/21/22 1036)    ED Course/ Medical Decision Making/ A&P This patient  with a Hx of multiple medical issues including pacemaker, aortic valve replacement, hypertension, elevated risk profile for coronary disease presents to the ED for concern of chest pain, this involves an extensive number of treatment options, and is a complaint that carries with it a high risk of complications and morbidity.    The differential diagnosis includes ACS, pneumonia, hypertensive crisis, pneumothorax, less likely aortic disruption, though this is a consideration.   Social Determinants of Health:  Advanced age, multiple medical problems  Additional history obtained:  Additional history and/or information obtained from chart review, notable for notes for recent pacemaker placement   After the initial evaluation, orders, including: labs, monitoring, xr (from triage nurse)were initiated. Patient hypertensive on arrival.  Patient  placed on Cardiac and Pulse-Oximetry Monitors. The patient was maintained on a cardiac monitor.  The cardiac monitored showed an rhythm of paced 60 -abnormal The patient was also maintained on pulse oximetry. The readings were typically 99% ra, nml   On repeat evaluation of the patient improved  Lab Tests:  I personally interpreted labs.  The pertinent results include: Troponin delta 35  Imaging Studies ordered:  I independently visualized and interpreted imaging which showed no obvious abnormality on chest x-ray I agree with the radiologist interpretation  Consultations Obtained:  I requested consultation with the cardiology team,  and discussed lab and imaging findings. 3:43 PM Patient accompanied by her daughter.  We discussed all findings.  Suspicion for medication noncompliance/hypertensive urgency contributing to her elevated troponin, but given her substantial history I discussed her case with her cardiology team.  Recommendation for outpatient therapy, follow-up which they will expedite, adjustment of medications  Dispostion / Final  MDM:  After consideration of the diagnostic results and the patient's response to treatment, this patient who acknowledges inconsistent compliance with her hypertension medications presents with chest pain.  Patient has a slight elevation here, though with positive delta troponin discussed case with cardiology, as above.  Patient has otherwise no concerns, little evidence for decompensated state, no evidence for other phenomena, patient discharged in stable condition with medication adjustments.   Final Clinical Impression(s) / ED Diagnoses Final diagnoses:  Atypical chest pain  Elevated troponin    Rx / DC Orders ED Discharge Orders          Ordered    olmesartan (BENICAR) 40 MG tablet  Daily        03/21/22 1543              Carmin Muskrat, MD 03/21/22 [phone]

## 2022-03-21 NOTE — Discharge Instructions (Signed)
As discussed, it is very important that you take your medication as prescribed including your amlodipine and the newly prescribed olmesartan.  Our cardiology team will con take you for follow-up in the office this week.  Return here for concerning changes in your condition.

## 2022-03-21 NOTE — Consult Note (Signed)
Cardiology Consultation   Patient ID: Briana Foster MRN: [id number]; DOB: 24-Nov-1945  Admit date: 03/21/2022 Date of Consult: 03/21/2022  PCP:  Wenda Low, MD   [facility] Cardiologist:  Sinclair Grooms, MD (Inactive)  Electrophysiologist:  Will Meredith Leeds, MD  1}     Patient Profile:   Briana Foster is a 77 y.o. female with a hx of hypertension, hyperlipidemia, permanent AF on Eliquis, SSS s/p AV node ablation with PPM, AS s/p TAVR, carotid stenosis with 60-79% R ICA stenosis, RA, obesity, GERD uncontrolled DM2, nephrolithiasis, and medication noncompliance who is being seen 03/21/2022 for the evaluation of chest pain at the request of Dr. Vanita Panda.  History of Present Illness:   Briana Foster is felt to be in permanent atrial fibrillation after failing to maintain sinus rhythm on amiodarone.  She is rate controlled on metoprolol and Cardizem.  Echocardiogram 05/2020 showed LVEF 55-60%, mild LVH, moderate MAC with trivial MR, severe low-flow low gradient AS.  She subsequently underwent right and left heart catheterization 06/15/2020 that showed widely patent coronary arteries and severe AS.  She underwent TAVR on 06/30/2020.  She had ongoing issues with tachybradycardia syndrome and ultimately underwent [employer] Jude dual-chamber PPM implantation 10/2020.  She continued to have issues with tachycardia and underwent AV node ablation on 07/14/2021 with Dr. Curt Bears.  Hemoglobin A1c 8.9% on 11/04/2021.  She has issues with medication compliance.  She was hospitalized 01/26/2022 - 01/28/2022 with acute hypoxic respiratory failure secondary to COVID-19 infection.  After discharging home, she had sharp pain in her right lower quadrant and presented back and was hospitalized 01/31/2022 - 02/03/2022 with labs and imaging concerning for pyelonephritis.  Unfortunately she presented back to [facility] ED via EMS for chest pain that started at 4 AM.  She was significantly hypertensive at  220/110.  She does report sometimes missing her blood pressure medications because she does not like taking medicine and sometimes forgets.  Nitroglycerin was administered and this improved her pain down to 4/10.  HST 16 --> 51  Cardiology was consulted for chest pain. So far she has received 10 mg amlodipine and 1g of tylenol. She has had no further chest pain even though BP remains significantly elevated. She reported a busy day at church on Sunday and she went to sleep without taking her BP medications.  She woke up at 4AM and noted that she was having chest pain but also pain "everywhere."  Her daughter called EMS and insisted on evaluation. She has had no further CP since arriving.   She reports good compliance with medications, but she does forget from time to time, she does not like taking medications.   PTA:  10 mg amlodipine 180 mg cardizem ? 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 Angelena Form & Dr. Roxy Manns   Severe aortic stenosis    Type 2 diabetes mellitus without complication, without long-term current use of insulin ([place]) 04/10/2018   Vitamin D deficiency     Past Surgical History:  Procedure Laterality Date   AV NODE ABLATION N/A 07/14/2021   Procedure: AV NODE ABLATION;  Surgeon: Constance Haw, MD;  Location: [place] [facility];  Service:  Cardiovascular;  Laterality: N/A;   BUNIONECTOMY  20 yrs ago   bil feet   BUNIONECTOMY  11/30/2011   Procedure: BUNIONECTOMY;  Surgeon: Magnus Sinning, MD;  Location: [facility];  Service: Orthopedics;  Laterality: Bilateral;  RIGHT FOOT EXCISION OF BUNIONETTE AND PARTIAL   PROXIMAL PHALANGECTOMY OF 5TH TOE LEFT FOOT FUNK BUNIONECTOMY,EXCISION OF BUNIONETTE    CARDIOVERSION N/A 11/05/2019   Procedure: CARDIOVERSION;  Surgeon: Lelon Perla, MD;  Location: [facility] ENDOSCOPY;  Service: Cardiovascular;  Laterality: N/A;   CARDIOVERSION N/A 12/05/2019   Procedure: CARDIOVERSION;  Surgeon: Elouise Munroe, MD;  Location: [place];  Service: Cardiovascular;  Laterality: N/A;   COLONOSCOPY WITH PROPOFOL N/A 08/07/2012   Procedure: COLONOSCOPY WITH PROPOFOL;  Surgeon: Garlan Fair, MD;  Location: [facility] ENDOSCOPY;  Service: Endoscopy;  Laterality: N/A;   MOUTH SURGERY     teeth extractions   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   Procedure: PACEMAKER IMPLANT;  Surgeon: Constance Haw, MD;  Location: [place] [facility];  Service: Cardiovascular;  Laterality: N/A;   RIGHT/LEFT HEART CATH AND CORONARY ANGIOGRAPHY N/A 06/17/2020   Procedure: RIGHT/LEFT HEART CATH AND CORONARY ANGIOGRAPHY;  Surgeon: Belva Crome, MD;  Location: [place] [facility];  Service: Cardiovascular;  Laterality: N/A;   TONSILLECTOMY     TRANSCATHETER AORTIC VALVE REPLACEMENT, TRANSFEMORAL N/A 06/30/2020   Procedure: TRANSCATHETER AORTIC VALVE REPLACEMENT, TRANSFEMORAL;  Surgeon: Burnell Blanks, MD;  Location: [place] [facility];  Service: Open Heart Surgery;  Laterality: N/A;   TUBAL LIGATION     WRIST SURGERY       Home Medications:  Prior to Admission medications   Medication Sig Start Date End Date Taking? Authorizing Provider  acetaminophen (TYLENOL) 500 MG tablet Take 500-1,000 mg by mouth every 8 (eight) hours as needed for moderate pain.    [provider]  albuterol (VENTOLIN HFA) 108 (90 Base)  MCG/ACT inhaler Inhale 2 puffs into the lungs every 4 (four) hours as needed for wheezing or shortness of breath.    [provider]  amLODipine (NORVASC) 10 MG tablet Take 1 tablet (10 mg total) by mouth daily. 11/03/21   [name], NP  apixaban (ELIQUIS) 5 MG TABS tablet Take 1 tablet (5 mg total) by mouth 2 (two) times daily. 06/18/21   Belva Crome, MD  benzonatate (TESSALON) 200 MG capsule Take 200 mg by mouth 3 (three) times daily as needed for cough. Patient not taking: Reported on 02/01/2022 11/12/21   [provider]  cetirizine (ZYRTEC) 10 MG tablet Take 10 mg by mouth daily as needed for allergies.    [provider]  Cyanocobalamin (VITAMIN B-12 PO) Take 1 tablet by mouth daily.    [provider]  diclofenac Sodium (VOLTAREN) 1 % GEL Apply 2 g topically 4 (four) times daily. 11/09/21   Amin, [name], MD  digoxin (LANOXIN) 0.125 MG tablet Take 1 tablet (0.125 mg total) by mouth daily. 06/11/21   [name], PA-C  diltiazem (DILACOR XR) 180 MG 24 hr capsule Take 180 mg by mouth daily.    [provider]  docusate sodium (COLACE) 100 MG capsule Take 1 capsule (100 mg total) by mouth 2 (two) times daily. Patient taking differently: Take 100 mg by mouth daily as needed for mild constipation. 11/09/21   Amin, [name], MD  guaiFENesin-dextromethorphan (ROBITUSSIN DM) 100-10 MG/5ML syrup Take 10 mLs by mouth every 4 (four) hours as needed for cough. Patient not taking: Reported on 02/01/2022 01/28/22   Geradine Girt, DO  meclizine (ANTIVERT) 25 MG tablet Take 25 mg by mouth 2 (two) times daily as needed (vertigo). 11/12/20   [provider]  Multiple Vitamins-Minerals (MULTIVITAMIN WITH MINERALS) tablet Take 1 tablet by mouth daily.    [provider]  rOPINIRole (REQUIP) 4 MG tablet Take 4 mg by mouth at bedtime.    [provider]    Inpatient Medications: Scheduled Meds:  Continuous  Infusions:  PRN Meds:   Allergies:    Allergies  Allergen Reactions   Bee Venom Shortness Of Breath and Rash   Claritin [Loratadine] Itching and Palpitations   Wasp Venom Other (See Comments) and Anaphylaxis   Apricot Flavor Swelling    Eyes swell shut   Atorvastatin Itching    Eye swelling Other reaction(s): HA    Social History:   Social History   Socioeconomic History   Marital status: Widowed    Spouse name: Not on file   Number of children: 2   Years of education: Not on file   Highest education level: Not on file  Occupational History   Occupation: Retired-worked at [employer] Use   Smoking status: Never   Smokeless tobacco: Never  Vaping Use   Vaping Use: Never used  Substance and Sexual Activity   Alcohol use: No   Drug use: No   Sexual activity: Not Currently  Other Topics Concern   Not on file  Social History Narrative   Not on file   Social Determinants of Health   Financial Resource Strain: Not on file  Food Insecurity: No Food Insecurity (02/01/2022)   Hunger Vital Sign    Worried About Running Out of Food in the Last Year: Never true    Ran Out of Food in the Last Year: Never true  Transportation Needs: No Transportation Needs (02/01/2022)   PRAPARE - Hydrologist (Medical): No    Lack of Transportation (Non-Medical): No  Physical Activity: Not on file  Stress: Not on file  Social Connections: Not on file  Intimate Partner Violence: Not At Risk (02/01/2022)   Humiliation, Afraid, Rape, and Kick questionnaire    Fear of Current or Ex-Partner: No    Emotionally Abused: No    Physically Abused: No    Sexually Abused: No    Family History:    Family History  Problem Relation Age of Onset   Heart disease Mother    Stroke Mother    Cancer Father        Prostate     ROS:  Please see the history of present illness.   All other ROS reviewed and negative.     Physical Exam/Data:   Vitals:   03/21/22  1015 03/21/22 1036 03/21/22 1053 03/21/22 1339  BP: (!) 156/129 (!) 180/111  (!) 178/120  Pulse:    63  Resp:    20  Temp:   97.8 F (36.6 C)   TempSrc:  Oral   SpO2:    95%  Weight:      Height:       No intake or output data in the 24 hours ending 03/21/22 1509    03/21/2022    6:42 AM 02/03/2022    4:34 AM 01/31/2022    9:52 PM  Last 3 Weights  Weight (lbs) 195 lb 194 lb 14.2 oz 178 lb 9.2 oz  Weight (kg) 88.451 kg 88.4 kg 81 kg     Body mass index is 29.65 kg/m.  General:  Well nourished, well developed, in no acute distress HEENT: normal Neck: no JVD Vascular: No carotid bruits; Distal pulses 2+ bilaterally Cardiac:  normal S1, S2; RRR; no murmur  Lungs:  wheezing throughout Abd: soft, nontender, no hepatomegaly  Ext: no edema Musculoskeletal:  No deformities, BUE and BLE strength normal and equal Skin: warm and dry  Neuro:  CNs 2-12 intact, no focal abnormalities noted Psych:  Normal affect   EKG:  The EKG was personally reviewed and demonstrates:  atrial fibrillation/flutter with V paced rhythm HR 60 Telemetry:  Telemetry was personally reviewed and demonstrates:  paced rhythm HR 60  Relevant CV Studies:  Echo 05/2021: 1. Left ventricular ejection fraction, by estimation, is 60 to 65%. The  left ventricle has normal function. The left ventricle has no regional  wall motion abnormalities. There is mild asymmetric left ventricular  hypertrophy of the basal-septal segment.  Left ventricular diastolic parameters are indeterminate.   2. Right ventricular systolic function is normal. The right ventricular  size is normal. There is normal pulmonary artery systolic pressure.   3. Left atrial size was severely dilated.   4. The mitral valve is normal in structure. Trivial mitral valve  regurgitation. No evidence of mitral stenosis. Moderate mitral annular  calcification.   5. The aortic valve has been repaired/replaced. Aortic valve  regurgitation is not visualized. No  aortic stenosis is present. There is a  23 mm Edwards Sapien prosthetic (TAVR) valve present in the aortic  position. Procedure Date: 06/30/2020. Echo findings   are consistent with normal structure and function of the aortic valve  prosthesis. Aortic valve area, by VTI measures 1.77 cm. Aortic valve mean  gradient measures 7.0 mmHg. Aortic valve Vmax measures 1.86 m/s.   6. The inferior vena cava is dilated in size with >50% respiratory  variability, suggesting right atrial pressure of 8 mmHg.    Right and left heart catheterization 05/2020: Very poor rate control related to patient not taking medications.  Atrial fibrillation with RVR varying between 101 160 bpm.  Therefore, the patient was treated with IV metoprolol receiving 15 mg and 5 mg doses over 30 minutes before proceeding.  This decrease the heart rate into the 90 to 115 bpm range. Widely patent coronary arteries.  Vessels are tortuous but have no significant obstruction. Normal pulmonary artery pressures with mean pulmonary wedge 23 mmHg (influenced by rate). Variable RR interval made it difficult to appropriately assess left ventricular gradient.  "Eyeball" peak to peak gradient is 20 mmHg. AV coronary and mitral annular calcification noted on cine fluoroscopy. Cardiac output by Fick 4.82 L/min with index 1.99 L/min/m.  These findings are consistent with potential low-flow low gradient aortic stenosis as suspected.  Calculated aortic valve area 1.0 cm based upon a mean gradient of 14 mmHg.   RECOMMENDATIONS:   Back to Dr. Angelena Form for CT imaging and referral to cardiac surgery for second opinion concerning management with TAVR versus SAVR.  Laboratory Data:  High Sensitivity Troponin:   Recent Labs  Lab 03/21/22 0646 03/21/22 0840  TROPONINIHS 16 51*     Chemistry Recent Labs  Lab 03/21/22 0646  NA 138  K 3.9  CL 107  CO2 21*  GLUCOSE 220*  BUN 16  CREATININE 0.78  CALCIUM 9.2  GFRNONAA >60  ANIONGAP 10     No results for input(s): "PROT", "ALBUMIN", "AST", "ALT", "ALKPHOS", "BILITOT" in the last 168 hours. Lipids No results for input(s): "CHOL", "TRIG", "HDL", "LABVLDL", "LDLCALC", "CHOLHDL" in the last 168 hours.  Hematology Recent Labs  Lab 03/21/22 0646  WBC 5.7  RBC 4.20  HGB 13.3  HCT 38.5  MCV 91.7  MCH 31.7  MCHC 34.5  RDW 13.1  PLT 193   Thyroid No results for input(s): "TSH", "FREET4" in the last 168 hours.  BNPNo results for input(s): "BNP", "PROBNP" in the last 168 hours.  DDimer No results for input(s): "DDIMER" in the last 168 hours.   Radiology/Studies:  DG Chest 2 View  Result Date: 03/21/2022 CLINICAL DATA:  Chest pain EXAM: CHEST - 2 VIEW COMPARISON:  CXR 01/31/22 FINDINGS: Left-sided single lead cardiac device in place with unchanged lead positioning. Postprocedural changes from TAVR. Pleural effusion. No pneumothorax. Focal airspace opacity. Normal cardiac and mediastinal contours. No radiographically apparent displaced rib fractures. Visualized upper abdomen is unremarkable. Degenerative changes at right [facility] joint. Vertebral body heights are maintained. IMPRESSION: No radiographic finding to explain chest pain. Electronically Signed   By: Marin Roberts M.D.   On: 03/21/2022 07:21     Assessment and Plan:   Chest pain HST 16 --> 51 Suspect this was due to hypertensive emergency Essentially normal coronaries on heart cath in 2022   Hypertensive emergency Hypertension Medication management Intermittent medication noncompliance Has received 10 mg amlodipine - BP remains significantly elevated Renal function remains normal Pt is on cardizem and amlodipine - disagree with this regimen. Given that she does not require AV nodal control after ablation, would D/C cardizem and continue amlodipine. She will very likely need better BP control. Given her DM, will start olmesratan 40 mg.  - she will need better BP control and log for home   Permanent atrial  fibrillation Hx of AV node ablation SSS Chronic anticoagulation On cardizem 180 mg and digoxin I do not think we need to continue digoxin I also do not think we need to continue cardizem in the setting of also being on amlodipine   Severe AS s/p TAVR Good function on last echo   Summary: Continue 10 mg amlodipine Start 40 mg olmesartan  Stop cardizem Stop digoxin  Follow up in cardiology office (I will arrange follow up)  She would not do well with hydralazine. She needs once daily medications.   Pt would like to establish with Dr. Johney Frame (previously Dr. Tamala Julian).     Risk Assessment/Risk Scores:     TIMI Risk Score for Unstable Angina or Non-ST Elevation MI:   The patient's TIMI risk score is 3, which indicates a 13% risk of all cause mortality, new or recurrent myocardial infarction or need for urgent revascularization in the next 14 days.    CHA2DS2-VASc Score = 6   This indicates a 9.7% annual risk of stroke. The patient's score is based upon: CHF History: 1 HTN History: 1 Diabetes History: 1 Stroke History: 0 Vascular Disease History: 1 Age Score: 1 Gender Score: 1    For questions or updates, please contact [place] Please  consult [email or web address] for contact info under    Signed, Ledora Bottcher, [place]  03/21/2022 3:09 PM

## 2022-03-29 ENCOUNTER — Encounter: Payer: Self-pay | Admitting: Physician Assistant

## 2022-03-29 ENCOUNTER — Ambulatory Visit: Payer: Medicare HMO | Attending: Physician Assistant | Admitting: Physician Assistant

## 2022-03-29 VITALS — BP 144/74 | HR 60 | Ht 68.0 in | Wt 200.8 lb

## 2022-03-29 DIAGNOSIS — E7800 Pure hypercholesterolemia, unspecified: Secondary | ICD-10-CM | POA: Diagnosis not present

## 2022-03-29 DIAGNOSIS — I6523 Occlusion and stenosis of bilateral carotid arteries: Secondary | ICD-10-CM | POA: Diagnosis not present

## 2022-03-29 DIAGNOSIS — I4819 Other persistent atrial fibrillation: Secondary | ICD-10-CM

## 2022-03-29 DIAGNOSIS — I10 Essential (primary) hypertension: Secondary | ICD-10-CM

## 2022-03-29 DIAGNOSIS — I350 Nonrheumatic aortic (valve) stenosis: Secondary | ICD-10-CM | POA: Diagnosis not present

## 2022-03-29 NOTE — Assessment & Plan Note (Signed)
S/p TAVR in 2022. TTE in 05/2021 with normal structure and function of TAVR. Continue SBE prophylaxis.

## 2022-03-29 NOTE — Assessment & Plan Note (Signed)
S/p AV node ablation and pacemaker implantation in the setting of tachy brady syndrome. She is now pacer dependent. She is still on digoxin. Reviewed with Dr. Curt Bears.  Stop Digoxin Continue Apixaban 5 mg twice daily

## 2022-03-29 NOTE — Assessment & Plan Note (Signed)
She was recently seen in the ED with hypertensive urgency. She had chest pain and minimally elevated hsTrops. She had normal cors by cath in 2022. As noted, she was seen by Dr. Martinique and no further ischemic testing was felt to be necessary. She has not had chest pain since the day after she left the ED. Her BPs have been better at home. She feels much better. She notes some balance issues and uses a cane. I would try to get her BP < 140/90. She is almost to goal. We discussed limiting sodium. Continue Amlodipine 10 mg, Olmesartan 40 mg. Monitor blood pressure for 2 weeks. If BP remains above target, consider low dose Thiazide diuretic. F/u with me in 3 mos.

## 2022-03-29 NOTE — Progress Notes (Signed)
Cardiology Office Note:   Date:  03/29/2022  ID:  Briana Foster, Briana Foster Aug 12, 1945, MRN [id number]  Patient Profile: Aortic stenosis S/p TAVR in 05/2020  TTE 05/20/2021: EF 60-65, no RWMA, mild asymmetric LVH, normal RVSF, normal PASP, severe LAE, trivial MR, s/p TAVR-normal structure and function (mean gradient 7), RAP 8 LHC 06/17/2020: Normal coronary arteries Permanent atrial fibrillation  Tachy-brady syndrome S/p AV node ablation S/p pacemaker implantation  Hypertension  Hyperlipidemia  Carotid artery disease  [place] [id number]: RICA [id number]; LICA [id number]  Diabetes mellitus    History of Present Illness:   Briana Foster is a 77 y.o. female with the above problem list.  Briana Foster was last seen in clinic by Ambrose Pancoast, NP 11/03/21. She returns for follow up after being seen in the emergency room 03/21/2022 with chest pain and uncontrolled blood pressure (178/120).  Her potassium, creatinine, hemoglobin were normal.  EKG was V paced.  Her hsTrop was elevated (16>>51).  She was seen by cardiology for consultation (Dr. Martinique).  Elevated troponins were felt to be insignificant and did not require further workup.  She was placed on amlodipine and olmesartan for control of her blood pressure. She is here alone. She is feeling better since she went to the ED. She had some chest pain for 1 or 2 days after the ED but has not had any since. She has not had significant shortness of breath. She has not had syncope, edema. She sleeps on 2 pillows chronically.       Review of Systems  Gastrointestinal:  Negative for hematochezia.  Genitourinary:  Negative for hematuria.    Objective   Labs/Other Test Reviewed:     Risk Assessment/Calculations/Metrics:    CHA2DS2-VASc Score = 6   This indicates a 9.7% annual risk of stroke. The patient's score is based upon: CHF History: 1 HTN History: 1 Diabetes History: 1 Stroke History: 0 Vascular Disease History: 1 Age Score: 1 Gender Score: 1         HYPERTENSION CONTROL Vitals:   03/29/22 1459 03/29/22 1534  BP: (!) 143/64 (!) 144/74    The patient's blood pressure is elevated above target today.  In order to address the patient's elevated BP: Blood pressure will be monitored at home to determine if medication changes need to be made.      Physical Exam:   VS:  BP (!) 144/74   Pulse 60   Ht '5\' 8"'$  (1.727 m)   Wt 200 lb 12.8 oz (91.1 kg)   SpO2 95%   BMI 30.53 kg/m    Wt Readings from Last 3 Encounters:  03/29/22 200 lb 12.8 oz (91.1 kg)  03/21/22 195 lb (88.5 kg)  02/03/22 194 lb 14.2 oz (88.4 kg)    Constitutional:      Appearance: Healthy appearance. Not in distress.  Neck:     Vascular: JVD normal.  Pulmonary:     Breath sounds: Normal breath sounds. No wheezing. No rales.  Cardiovascular:     Normal rate. Regular rhythm. Normal S1. Normal S2.      Murmurs: There is a grade 2/6 systolic murmur at the URSB.  Edema:    Peripheral edema absent.  Abdominal:     Palpations: Abdomen is soft.     Assessment & Plan    ASSESSMENT & PLAN:   Essential hypertension She was recently seen in the ED with hypertensive urgency. She had chest pain and minimally elevated hsTrops. She had normal cors by cath in 2022.  As noted, she was seen by Dr. Martinique and no further ischemic testing was felt to be necessary. She has not had chest pain since the day after she left the ED. Her BPs have been better at home. She feels much better. She notes some balance issues and uses a cane. I would try to get her BP < 140/90. She is almost to goal. We discussed limiting sodium. Continue Amlodipine 10 mg, Olmesartan 40 mg. Monitor blood pressure for 2 weeks. If BP remains above target, consider low dose Thiazide diuretic. F/u with me in 3 mos.   Persistent atrial fibrillation (HCC) S/p AV node ablation and pacemaker implantation in the setting of tachy brady syndrome. She is now pacer dependent. She is still on digoxin. Reviewed with Dr. Curt Bears.   Stop Digoxin Continue Apixaban 5 mg twice daily   Aortic stenosis, severe S/p TAVR in 2022. TTE in 05/2021 with normal structure and function of TAVR. Continue SBE prophylaxis.   Carotid arterial disease ([place]) RICA 60-79% on [place] in 07/2021. She will need a f/u US in 07/2022.   Hyperlipidemia Given carotid artery disease, she should be on statin Rx. She has an allergy listed to Atorvastatin. She used to be on Rosuvastatin. It is not clear why she is off this. We can discuss further at next OV. If intol to statin Rx, consider Zetia.         Dispo:  Return in about 3 months (around 06/27/2022) for Routine Follow Up, w/ Richardson Dopp, PA-C.   Signed, Richardson Dopp, PA-C  03/29/2022 3:53 PM    [facility] [address], [place], [place]  [address] Phone: [phone]; Fax: [phone]

## 2022-03-29 NOTE — Assessment & Plan Note (Signed)
RICA 60-79% on [place] in 07/2021. She will need a f/u US in 07/2022.

## 2022-03-29 NOTE — Patient Instructions (Signed)
Medication Instructions:  Your physician has recommended you make the following change in your medication:  S TOP Digoxin  *If you need a refill on your cardiac medications before your next appointment, please call your pharmacy*   Lab Work: None ordered  If you have labs (blood work) drawn today and your tests are completely normal, you will receive your results only by: [place] (if you have MyChart) OR A paper copy in the mail If you have any lab test that is abnormal or we need to change your treatment, we will call you to review the results.   Testing/Procedures: None ordered   Follow-Up: At [facility], you and your health needs are our priority.  As part of our continuing mission to provide you with exceptional heart care, we have created designated Provider Care Teams.  These Care Teams include your primary Cardiologist (physician) and Advanced Practice Providers (APPs -  Physician Assistants and Nurse Practitioners) who all work together to provide you with the care you need, when you need it.  We recommend signing up for the patient portal called "MyChart".  Sign up information is provided on this After Visit Summary.  MyChart is used to connect with patients for Virtual Visits (Telemedicine).  Patients are able to view lab/test results, encounter notes, upcoming appointments, etc.  Non-urgent messages can be sent to your provider as well.   To learn more about what you can do with MyChart, go to [email or web address].    Your next appointment:   9 month(s)  Provider:   Lenna Sciara, MD     Other Instructions  Your physician has requested that you regularly monitor and record your blood pressure readings at home. Please use the same machine at the same time of day to check your readings and record them to bring to your follow-up visit.   Please monitor blood pressures and keep a log of your readings for 2 weeks then send [place] a mychart message.     Make sure to check 2 hours after your medications.    AVOID these things for 30 minutes before checking your blood pressure: No Drinking caffeine. No Drinking alcohol. No Eating. No Smoking. No Exercising.   Five minutes before checking your blood pressure: Pee. Sit in a dining chair. Avoid sitting in a soft couch or armchair. Be quiet. Do not talk   Low-Sodium Eating Plan Sodium, which is an element that makes up salt, helps you maintain a healthy balance of fluids in your body. Too much sodium can increase your blood pressure and cause fluid and waste to be held in your body. Your health care provider or dietitian may recommend following this plan if you have high blood pressure (hypertension), kidney disease, liver disease, or heart failure. Eating less sodium can help lower your blood pressure, reduce swelling, and protect your heart, liver, and kidneys. What are tips for following this plan? Reading food labels The Nutrition Facts label lists the amount of sodium in one serving of the food. If you eat more than one serving, you must multiply the listed amount of sodium by the number of servings. Choose foods with less than 140 mg of sodium per serving. Avoid foods with 300 mg of sodium or more per serving. Shopping  Look for lower-sodium products, often labeled as "low-sodium" or "no salt added." Always check the sodium content, even if foods are labeled as "unsalted" or "no salt added." Buy fresh foods. Avoid canned foods and pre-made or frozen  meals. Avoid canned, cured, or processed meats. Buy breads that have less than 80 mg of sodium per slice. Cooking  Eat more home-cooked food and less restaurant, buffet, and fast food. Avoid adding salt when cooking. Use salt-free seasonings or herbs instead of table salt or sea salt. Check with your health care provider or pharmacist before using salt substitutes. Cook with plant-based oils, such as canola, sunflower, or olive  oil. Meal planning When eating at a restaurant, ask that your food be prepared with less salt or no salt, if possible. Avoid dishes labeled as brined, pickled, cured, smoked, or made with soy sauce, miso, or teriyaki sauce. Avoid foods that contain MSG (monosodium glutamate). MSG is sometimes added to [place] food, bouillon, and some canned foods. Make meals that can be grilled, baked, poached, roasted, or steamed. These are generally made with less sodium. General information Most people on this plan should limit their sodium intake to 1,500-2,000 mg (milligrams) of sodium each day. What foods should I eat? Fruits Fresh, frozen, or canned fruit. Fruit juice. Vegetables Fresh or frozen vegetables. "No salt added" canned vegetables. "No salt added" tomato sauce and paste. Low-sodium or reduced-sodium tomato and vegetable juice. Grains Low-sodium cereals, including oats, puffed wheat and rice, and shredded wheat. Low-sodium crackers. Unsalted rice. Unsalted pasta. Low-sodium bread. Whole-grain breads and whole-grain pasta. Meats and other proteins Fresh or frozen (no salt added) meat, poultry, seafood, and fish. Low-sodium canned tuna and salmon. Unsalted nuts. Dried peas, beans, and lentils without added salt. Unsalted canned beans. Eggs. Unsalted nut butters. Dairy Milk. Soy milk. Cheese that is naturally low in sodium, such as ricotta cheese, fresh mozzarella, or Swiss cheese. Low-sodium or reduced-sodium cheese. Cream cheese. Yogurt. Seasonings and condiments Fresh and dried herbs and spices. Salt-free seasonings. Low-sodium mustard and ketchup. Sodium-free salad dressing. Sodium-free light mayonnaise. Fresh or refrigerated horseradish. Lemon juice. Vinegar. Other foods Homemade, reduced-sodium, or low-sodium soups. Unsalted popcorn and pretzels. Low-salt or salt-free chips. The items listed above may not be a complete list of foods and beverages you can eat. Contact a dietitian for more  information. What foods should I avoid? Vegetables Sauerkraut, pickled vegetables, and relishes. Olives. [place] fries. Onion rings. Regular canned vegetables (not low-sodium or reduced-sodium). Regular canned tomato sauce and paste (not low-sodium or reduced-sodium). Regular tomato and vegetable juice (not low-sodium or reduced-sodium). Frozen vegetables in sauces. Grains Instant hot cereals. Bread stuffing, pancake, and biscuit mixes. Croutons. Seasoned rice or pasta mixes. Noodle soup cups. Boxed or frozen macaroni and cheese. Regular salted crackers. Self-rising flour. Meats and other proteins Meat or fish that is salted, canned, smoked, spiced, or pickled. Precooked or cured meat, such as sausages or meat loaves. [place] Salines. Ham. Pepperoni. Hot dogs. Corned beef. Chipped beef. Salt pork. Jerky. Pickled herring. Anchovies and sardines. Regular canned tuna. Salted nuts. Dairy Processed cheese and cheese spreads. Hard cheeses. Cheese curds. Blue cheese. Feta cheese. String cheese. Regular cottage cheese. Buttermilk. Canned milk. Fats and oils Salted butter. Regular margarine. Ghee. Bacon fat. Seasonings and condiments Onion salt, garlic salt, seasoned salt, table salt, and sea salt. Canned and packaged gravies. Worcestershire sauce. Tartar sauce. Barbecue sauce. Teriyaki sauce. Soy sauce, including reduced-sodium. Steak sauce. Fish sauce. Oyster sauce. Cocktail sauce. Horseradish that you find on the shelf. Regular ketchup and mustard. Meat flavorings and tenderizers. Bouillon cubes. Hot sauce. Pre-made or packaged marinades. Pre-made or packaged taco seasonings. Relishes. Regular salad dressings. Salsa. Other foods Salted popcorn and pretzels. Corn chips and puffs. Potato and tortilla  chips. Canned or dried soups. Pizza. Frozen entrees and pot pies. The items listed above may not be a complete list of foods and beverages you should avoid. Contact a dietitian for more information. Summary Eating less  sodium can help lower your blood pressure, reduce swelling, and protect your heart, liver, and kidneys. Most people on this plan should limit their sodium intake to 1,500-2,000 mg (milligrams) of sodium each day. Canned, boxed, and frozen foods are high in sodium. Restaurant foods, fast foods, and pizza are also very high in sodium. You also get sodium by adding salt to food. Try to cook at home, eat more fresh fruits and vegetables, and eat less fast food and canned, processed, or prepared foods. This information is not intended to replace advice given to you by your health care provider. Make sure you discuss any questions you have with your health care provider. Document Revised: 12/24/2018 Document Reviewed: 12/19/2018 Elsevier Patient Education  [place].

## 2022-03-29 NOTE — Assessment & Plan Note (Signed)
Given carotid artery disease, she should be on statin Rx. She has an allergy listed to Atorvastatin. She used to be on Rosuvastatin. It is not clear why she is off this. We can discuss further at next OV. If intol to statin Rx, consider Zetia.

## 2022-03-30 DIAGNOSIS — E1169 Type 2 diabetes mellitus with other specified complication: Secondary | ICD-10-CM | POA: Diagnosis not present

## 2022-03-30 DIAGNOSIS — E782 Mixed hyperlipidemia: Secondary | ICD-10-CM | POA: Diagnosis not present

## 2022-03-30 DIAGNOSIS — I4891 Unspecified atrial fibrillation: Secondary | ICD-10-CM | POA: Diagnosis not present

## 2022-03-30 DIAGNOSIS — I10 Essential (primary) hypertension: Secondary | ICD-10-CM | POA: Diagnosis not present

## 2022-03-30 DIAGNOSIS — J449 Chronic obstructive pulmonary disease, unspecified: Secondary | ICD-10-CM | POA: Diagnosis not present

## 2022-03-30 DIAGNOSIS — E1165 Type 2 diabetes mellitus with hyperglycemia: Secondary | ICD-10-CM | POA: Diagnosis not present

## 2022-03-30 DIAGNOSIS — J45909 Unspecified asthma, uncomplicated: Secondary | ICD-10-CM | POA: Diagnosis not present

## 2022-03-30 DIAGNOSIS — K219 Gastro-esophageal reflux disease without esophagitis: Secondary | ICD-10-CM | POA: Diagnosis not present

## 2022-03-30 DIAGNOSIS — F325 Major depressive disorder, single episode, in full remission: Secondary | ICD-10-CM | POA: Diagnosis not present

## 2022-04-11 NOTE — Progress Notes (Signed)
Remote pacemaker transmission.   

## 2022-04-25 DIAGNOSIS — I4891 Unspecified atrial fibrillation: Secondary | ICD-10-CM | POA: Diagnosis not present

## 2022-04-25 DIAGNOSIS — J45909 Unspecified asthma, uncomplicated: Secondary | ICD-10-CM | POA: Diagnosis not present

## 2022-04-25 DIAGNOSIS — E1169 Type 2 diabetes mellitus with other specified complication: Secondary | ICD-10-CM | POA: Diagnosis not present

## 2022-04-25 DIAGNOSIS — F325 Major depressive disorder, single episode, in full remission: Secondary | ICD-10-CM | POA: Diagnosis not present

## 2022-04-25 DIAGNOSIS — K219 Gastro-esophageal reflux disease without esophagitis: Secondary | ICD-10-CM | POA: Diagnosis not present

## 2022-04-25 DIAGNOSIS — E782 Mixed hyperlipidemia: Secondary | ICD-10-CM | POA: Diagnosis not present

## 2022-04-25 DIAGNOSIS — M1990 Unspecified osteoarthritis, unspecified site: Secondary | ICD-10-CM | POA: Diagnosis not present

## 2022-04-25 DIAGNOSIS — J449 Chronic obstructive pulmonary disease, unspecified: Secondary | ICD-10-CM | POA: Diagnosis not present

## 2022-04-25 DIAGNOSIS — I10 Essential (primary) hypertension: Secondary | ICD-10-CM | POA: Diagnosis not present

## 2022-05-12 DIAGNOSIS — M170 Bilateral primary osteoarthritis of knee: Secondary | ICD-10-CM | POA: Diagnosis not present

## 2022-05-12 DIAGNOSIS — M1712 Unilateral primary osteoarthritis, left knee: Secondary | ICD-10-CM | POA: Diagnosis not present

## 2022-05-12 DIAGNOSIS — M1711 Unilateral primary osteoarthritis, right knee: Secondary | ICD-10-CM | POA: Diagnosis not present

## 2022-05-22 ENCOUNTER — Inpatient Hospital Stay (HOSPITAL_COMMUNITY)
Admission: EM | Admit: 2022-05-22 | Discharge: 2022-05-30 | DRG: 871 | Disposition: A | Discharge status: Home / Self Care | Payer: Medicare HMO | Attending: Internal Medicine | Admitting: Internal Medicine

## 2022-05-22 ENCOUNTER — Encounter (HOSPITAL_COMMUNITY): Payer: Self-pay | Admitting: Emergency Medicine

## 2022-05-22 ENCOUNTER — Other Ambulatory Visit: Payer: Self-pay

## 2022-05-22 ENCOUNTER — Emergency Department (HOSPITAL_COMMUNITY): Payer: Medicare HMO

## 2022-05-22 DIAGNOSIS — G2581 Restless legs syndrome: Secondary | ICD-10-CM | POA: Diagnosis present

## 2022-05-22 DIAGNOSIS — E785 Hyperlipidemia, unspecified: Secondary | ICD-10-CM | POA: Diagnosis present

## 2022-05-22 DIAGNOSIS — J9601 Acute respiratory failure with hypoxia: Secondary | ICD-10-CM | POA: Diagnosis present

## 2022-05-22 DIAGNOSIS — Z950 Presence of cardiac pacemaker: Secondary | ICD-10-CM

## 2022-05-22 DIAGNOSIS — T380X5A Adverse effect of glucocorticoids and synthetic analogues, initial encounter: Secondary | ICD-10-CM | POA: Diagnosis not present

## 2022-05-22 DIAGNOSIS — Z8042 Family history of malignant neoplasm of prostate: Secondary | ICD-10-CM | POA: Diagnosis not present

## 2022-05-22 DIAGNOSIS — M25551 Pain in right hip: Secondary | ICD-10-CM | POA: Diagnosis not present

## 2022-05-22 DIAGNOSIS — Z8616 Personal history of COVID-19: Secondary | ICD-10-CM | POA: Diagnosis not present

## 2022-05-22 DIAGNOSIS — I495 Sick sinus syndrome: Secondary | ICD-10-CM | POA: Diagnosis present

## 2022-05-22 DIAGNOSIS — F32A Depression, unspecified: Secondary | ICD-10-CM | POA: Diagnosis present

## 2022-05-22 DIAGNOSIS — J441 Chronic obstructive pulmonary disease with (acute) exacerbation: Secondary | ICD-10-CM | POA: Insufficient documentation

## 2022-05-22 DIAGNOSIS — W1830XA Fall on same level, unspecified, initial encounter: Secondary | ICD-10-CM | POA: Diagnosis present

## 2022-05-22 DIAGNOSIS — E876 Hypokalemia: Secondary | ICD-10-CM | POA: Diagnosis present

## 2022-05-22 DIAGNOSIS — M069 Rheumatoid arthritis, unspecified: Secondary | ICD-10-CM | POA: Diagnosis present

## 2022-05-22 DIAGNOSIS — R0603 Acute respiratory distress: Secondary | ICD-10-CM | POA: Diagnosis not present

## 2022-05-22 DIAGNOSIS — J189 Pneumonia, unspecified organism: Secondary | ICD-10-CM | POA: Diagnosis present

## 2022-05-22 DIAGNOSIS — Z6830 Body mass index (BMI) 30.0-30.9, adult: Secondary | ICD-10-CM

## 2022-05-22 DIAGNOSIS — S0990XA Unspecified injury of head, initial encounter: Secondary | ICD-10-CM | POA: Diagnosis not present

## 2022-05-22 DIAGNOSIS — I10 Essential (primary) hypertension: Secondary | ICD-10-CM | POA: Diagnosis not present

## 2022-05-22 DIAGNOSIS — Z8249 Family history of ischemic heart disease and other diseases of the circulatory system: Secondary | ICD-10-CM | POA: Diagnosis not present

## 2022-05-22 DIAGNOSIS — Z1152 Encounter for screening for COVID-19: Secondary | ICD-10-CM

## 2022-05-22 DIAGNOSIS — I5032 Chronic diastolic (congestive) heart failure: Secondary | ICD-10-CM | POA: Diagnosis not present

## 2022-05-22 DIAGNOSIS — R6520 Severe sepsis without septic shock: Secondary | ICD-10-CM | POA: Diagnosis present

## 2022-05-22 DIAGNOSIS — R54 Age-related physical debility: Secondary | ICD-10-CM | POA: Diagnosis present

## 2022-05-22 DIAGNOSIS — R0602 Shortness of breath: Secondary | ICD-10-CM | POA: Diagnosis not present

## 2022-05-22 DIAGNOSIS — J45901 Unspecified asthma with (acute) exacerbation: Secondary | ICD-10-CM | POA: Diagnosis not present

## 2022-05-22 DIAGNOSIS — Z9181 History of falling: Secondary | ICD-10-CM

## 2022-05-22 DIAGNOSIS — E669 Obesity, unspecified: Secondary | ICD-10-CM | POA: Diagnosis present

## 2022-05-22 DIAGNOSIS — Z91030 Bee allergy status: Secondary | ICD-10-CM

## 2022-05-22 DIAGNOSIS — Z952 Presence of prosthetic heart valve: Secondary | ICD-10-CM | POA: Diagnosis not present

## 2022-05-22 DIAGNOSIS — J4541 Moderate persistent asthma with (acute) exacerbation: Secondary | ICD-10-CM | POA: Diagnosis not present

## 2022-05-22 DIAGNOSIS — R296 Repeated falls: Secondary | ICD-10-CM | POA: Diagnosis present

## 2022-05-22 DIAGNOSIS — R339 Retention of urine, unspecified: Secondary | ICD-10-CM | POA: Diagnosis present

## 2022-05-22 DIAGNOSIS — A419 Sepsis, unspecified organism: Secondary | ICD-10-CM | POA: Diagnosis not present

## 2022-05-22 DIAGNOSIS — Z7901 Long term (current) use of anticoagulants: Secondary | ICD-10-CM | POA: Diagnosis not present

## 2022-05-22 DIAGNOSIS — E1165 Type 2 diabetes mellitus with hyperglycemia: Secondary | ICD-10-CM

## 2022-05-22 DIAGNOSIS — Z888 Allergy status to other drugs, medicaments and biological substances status: Secondary | ICD-10-CM

## 2022-05-22 DIAGNOSIS — R55 Syncope and collapse: Secondary | ICD-10-CM | POA: Diagnosis present

## 2022-05-22 DIAGNOSIS — K219 Gastro-esophageal reflux disease without esophagitis: Secondary | ICD-10-CM | POA: Diagnosis present

## 2022-05-22 DIAGNOSIS — Z823 Family history of stroke: Secondary | ICD-10-CM

## 2022-05-22 DIAGNOSIS — S199XXA Unspecified injury of neck, initial encounter: Secondary | ICD-10-CM | POA: Diagnosis not present

## 2022-05-22 DIAGNOSIS — R0902 Hypoxemia: Secondary | ICD-10-CM | POA: Diagnosis not present

## 2022-05-22 DIAGNOSIS — M25552 Pain in left hip: Secondary | ICD-10-CM | POA: Diagnosis not present

## 2022-05-22 DIAGNOSIS — I350 Nonrheumatic aortic (valve) stenosis: Secondary | ICD-10-CM | POA: Diagnosis present

## 2022-05-22 DIAGNOSIS — R062 Wheezing: Secondary | ICD-10-CM | POA: Diagnosis not present

## 2022-05-22 DIAGNOSIS — I110 Hypertensive heart disease with heart failure: Secondary | ICD-10-CM | POA: Diagnosis present

## 2022-05-22 DIAGNOSIS — I4821 Permanent atrial fibrillation: Secondary | ICD-10-CM | POA: Diagnosis present

## 2022-05-22 DIAGNOSIS — Z79899 Other long term (current) drug therapy: Secondary | ICD-10-CM

## 2022-05-22 DIAGNOSIS — M1990 Unspecified osteoarthritis, unspecified site: Secondary | ICD-10-CM | POA: Diagnosis present

## 2022-05-22 LAB — TROPONIN I (HIGH SENSITIVITY)
Troponin I (High Sensitivity): 15 ng/L (ref <18)
Troponin I (High Sensitivity): 19 ng/L — ABNORMAL HIGH (ref <18)

## 2022-05-22 LAB — APTT: aPTT: 31 seconds (ref 24–36)

## 2022-05-22 LAB — COMPREHENSIVE METABOLIC PANEL
ALT: 30 U/L (ref 0–44)
AST: 32 U/L (ref 15–41)
Albumin: 4 g/dL (ref 3.5–5.0)
Alkaline Phosphatase: 72 U/L (ref 38–126)
Anion gap: 15 (ref 5–15)
BUN: 11 mg/dL (ref 8–23)
CO2: 20 mmol/L — ABNORMAL LOW (ref 22–32)
Calcium: 9.3 mg/dL (ref 8.9–10.3)
Chloride: 101 mmol/L (ref 98–111)
Creatinine, Ser: 0.92 mg/dL (ref 0.44–1.00)
GFR, Estimated: >60 mL/min (ref >60)
Glucose, Bld: 207 mg/dL — ABNORMAL HIGH (ref 70–99)
Potassium: 3.2 mmol/L — ABNORMAL LOW (ref 3.5–5.1)
Sodium: 136 mmol/L (ref 135–145)
Total Bilirubin: 1.3 mg/dL — ABNORMAL HIGH (ref 0.3–1.2)
Total Protein: 7.1 g/dL (ref 6.5–8.1)

## 2022-05-22 LAB — RESP PANEL BY RT-PCR (RSV, FLU A&B, COVID)  RVPGX2
Influenza A by PCR: NEGATIVE
Influenza B by PCR: NEGATIVE
Resp Syncytial Virus by PCR: NEGATIVE
SARS Coronavirus 2 by RT PCR: NEGATIVE

## 2022-05-22 LAB — CBC WITH DIFFERENTIAL/PLATELET
Abs Immature Granulocytes: 0.02 10*3/uL (ref 0.00–0.07)
Basophils Absolute: 0.1 10*3/uL (ref 0.0–0.1)
Basophils Relative: 1 %
Eosinophils Absolute: 0 10*3/uL (ref 0.0–0.5)
Eosinophils Relative: 0 %
HCT: 40.6 % (ref 36.0–46.0)
Hemoglobin: 13.2 g/dL (ref 12.0–15.0)
Immature Granulocytes: 0 %
Lymphocytes Relative: 13 %
Lymphs Abs: 1.2 10*3/uL (ref 0.7–4.0)
MCH: 30.7 pg (ref 26.0–34.0)
MCHC: 32.5 g/dL (ref 30.0–36.0)
MCV: 94.4 fL (ref 80.0–100.0)
Monocytes Absolute: 1 10*3/uL (ref 0.1–1.0)
Monocytes Relative: 12 %
Neutro Abs: 6.5 10*3/uL (ref 1.7–7.7)
Neutrophils Relative %: 74 %
Platelets: 156 10*3/uL (ref 150–400)
RBC: 4.3 MIL/uL (ref 3.87–5.11)
RDW: 13.1 % (ref 11.5–15.5)
WBC: 8.8 10*3/uL (ref 4.0–10.5)
nRBC: 0 % (ref 0.0–0.2)

## 2022-05-22 LAB — BRAIN NATRIURETIC PEPTIDE: B Natriuretic Peptide: 447.7 pg/mL — ABNORMAL HIGH (ref 0.0–100.0)

## 2022-05-22 LAB — I-STAT VENOUS BLOOD GAS, ED
Acid-base deficit: 4 mmol/L — ABNORMAL HIGH (ref 0.0–2.0)
Bicarbonate: 21.1 mmol/L (ref 20.0–28.0)
Calcium, Ion: 1.14 mmol/L — ABNORMAL LOW (ref 1.15–1.40)
HCT: 41 % (ref 36.0–46.0)
Hemoglobin: 13.9 g/dL (ref 12.0–15.0)
O2 Saturation: 70 %
Potassium: 3.2 mmol/L — ABNORMAL LOW (ref 3.5–5.1)
Sodium: 138 mmol/L (ref 135–145)
TCO2: 22 mmol/L (ref 22–32)
pCO2, Ven: 37.1 mmHg — ABNORMAL LOW (ref 44–60)
pH, Ven: 7.364 (ref 7.25–7.43)
pO2, Ven: 38 mmHg (ref 32–45)

## 2022-05-22 LAB — CBG MONITORING, ED
Glucose-Capillary: 259 mg/dL — ABNORMAL HIGH (ref 70–99)
Glucose-Capillary: 289 mg/dL — ABNORMAL HIGH (ref 70–99)
Glucose-Capillary: 381 mg/dL — ABNORMAL HIGH (ref 70–99)
Glucose-Capillary: 391 mg/dL — ABNORMAL HIGH (ref 70–99)

## 2022-05-22 LAB — PROTIME-INR
INR: 1.3 — ABNORMAL HIGH (ref 0.8–1.2)
Prothrombin Time: 15.8 seconds — ABNORMAL HIGH (ref 11.4–15.2)

## 2022-05-22 LAB — TSH: TSH: 0.512 u[IU]/mL (ref 0.350–4.500)

## 2022-05-22 LAB — LACTIC ACID, PLASMA
Lactic Acid, Venous: 2.9 mmol/L (ref 0.5–1.9)
Lactic Acid, Venous: 2.2 mmol/L (ref 0.5–1.9)

## 2022-05-22 LAB — T4, FREE: Free T4: 0.86 ng/dL (ref 0.61–1.12)

## 2022-05-22 MED ORDER — LACTATED RINGERS IV BOLUS (SEPSIS)
1000.0000 mL | Freq: Once | INTRAVENOUS | Status: AC
  Administered 2022-05-22: 1000 mL via INTRAVENOUS

## 2022-05-22 MED ORDER — MECLIZINE HCL 25 MG PO TABS: 25.0000 mg | ORAL_TABLET | Freq: Two times a day (BID) | ORAL | Status: DC | PRN

## 2022-05-22 MED ORDER — ALBUTEROL SULFATE (2.5 MG/3ML) 0.083% IN NEBU
2.5000 mg | INHALATION_SOLUTION | RESPIRATORY_TRACT | Status: DC | PRN
  Filled 2022-05-22: qty 3

## 2022-05-22 MED ORDER — METHYLPREDNISOLONE SODIUM SUCC 125 MG IJ SOLR
125.0000 mg | Freq: Once | INTRAMUSCULAR | Status: AC
  Administered 2022-05-22: 125 mg via INTRAVENOUS
  Filled 2022-05-22: qty 2

## 2022-05-22 MED ORDER — PREDNISONE 20 MG PO TABS
40.0000 mg | ORAL_TABLET | Freq: Every day | ORAL | Status: DC
  Administered 2022-05-24: 40 mg via ORAL
  Filled 2022-05-22: qty 2

## 2022-05-22 MED ORDER — AMLODIPINE BESYLATE 10 MG PO TABS
10.0000 mg | ORAL_TABLET | Freq: Every day | ORAL | Status: DC
  Administered 2022-05-22 – 2022-05-30 (×9): 10 mg via ORAL
  Filled 2022-05-22 (×2): qty 1
  Filled 2022-05-22 (×2): qty 2
  Filled 2022-05-22 (×5): qty 1

## 2022-05-22 MED ORDER — IPRATROPIUM-ALBUTEROL 0.5-2.5 (3) MG/3ML IN SOLN
3.0000 mL | Freq: Four times a day (QID) | RESPIRATORY_TRACT | Status: DC
  Administered 2022-05-22 – 2022-05-25 (×11): 3 mL via RESPIRATORY_TRACT
  Filled 2022-05-22 (×9): qty 3

## 2022-05-22 MED ORDER — IPRATROPIUM-ALBUTEROL 0.5-2.5 (3) MG/3ML IN SOLN
3.0000 mL | Freq: Once | RESPIRATORY_TRACT | Status: AC
  Administered 2022-05-22: 3 mL via RESPIRATORY_TRACT
  Filled 2022-05-22: qty 3

## 2022-05-22 MED ORDER — METHYLPREDNISOLONE SODIUM SUCC 40 MG IJ SOLR
40.0000 mg | Freq: Two times a day (BID) | INTRAMUSCULAR | Status: AC
  Administered 2022-05-22 – 2022-05-23 (×2): 40 mg via INTRAVENOUS
  Filled 2022-05-22 (×2): qty 1

## 2022-05-22 MED ORDER — LACTATED RINGERS IV BOLUS
1000.0000 mL | Freq: Once | INTRAVENOUS | Status: AC
  Administered 2022-05-22: 1000 mL via INTRAVENOUS

## 2022-05-22 MED ORDER — IRBESARTAN 300 MG PO TABS
300.0000 mg | ORAL_TABLET | Freq: Every day | ORAL | Status: DC
  Administered 2022-05-23 – 2022-05-30 (×8): 300 mg via ORAL
  Filled 2022-05-22 (×9): qty 1

## 2022-05-22 MED ORDER — ACETAMINOPHEN 500 MG PO TABS
500.0000 mg | ORAL_TABLET | Freq: Three times a day (TID) | ORAL | Status: DC | PRN
  Administered 2022-05-26 – 2022-05-28 (×2): 1000 mg via ORAL
  Filled 2022-05-22 (×2): qty 2

## 2022-05-22 MED ORDER — ACETAMINOPHEN 325 MG PO TABS
650.0000 mg | ORAL_TABLET | Freq: Once | ORAL | Status: AC
  Administered 2022-05-22: 650 mg via ORAL
  Filled 2022-05-22: qty 2

## 2022-05-22 MED ORDER — POTASSIUM CHLORIDE CRYS ER 20 MEQ PO TBCR
40.0000 meq | EXTENDED_RELEASE_TABLET | Freq: Once | ORAL | Status: AC
  Administered 2022-05-22: 40 meq via ORAL
  Filled 2022-05-22: qty 2

## 2022-05-22 MED ORDER — POTASSIUM CHLORIDE 10 MEQ/100ML IV SOLN
10.0000 meq | Freq: Once | INTRAVENOUS | Status: AC
  Administered 2022-05-22: 10 meq via INTRAVENOUS
  Filled 2022-05-22: qty 100

## 2022-05-22 MED ORDER — LORATADINE 10 MG PO TABS
10.0000 mg | ORAL_TABLET | Freq: Every day | ORAL | Status: DC
  Administered 2022-05-23 – 2022-05-30 (×2): 10 mg via ORAL
  Filled 2022-05-22 (×7): qty 1

## 2022-05-22 MED ORDER — MAGNESIUM SULFATE 2 GM/50ML IV SOLN
2.0000 g | Freq: Once | INTRAVENOUS | Status: AC
  Administered 2022-05-22: 2 g via INTRAVENOUS
  Filled 2022-05-22: qty 50

## 2022-05-22 MED ORDER — BENZONATATE 100 MG PO CAPS
200.0000 mg | ORAL_CAPSULE | Freq: Three times a day (TID) | ORAL | Status: DC | PRN
  Administered 2022-05-23 – 2022-05-27 (×5): 200 mg via ORAL
  Filled 2022-05-22 (×6): qty 2

## 2022-05-22 MED ORDER — ONDANSETRON HCL 4 MG/2ML IJ SOLN: 4.0000 mg | Freq: Four times a day (QID) | INTRAMUSCULAR | Status: DC | PRN

## 2022-05-22 MED ORDER — APIXABAN 5 MG PO TABS
5.0000 mg | ORAL_TABLET | Freq: Two times a day (BID) | ORAL | Status: DC
  Administered 2022-05-22 – 2022-05-30 (×16): 5 mg via ORAL
  Filled 2022-05-22 (×16): qty 1

## 2022-05-22 MED ORDER — ROPINIROLE HCL 1 MG PO TABS
4.0000 mg | ORAL_TABLET | Freq: Every day | ORAL | Status: DC
  Administered 2022-05-22: 4 mg via ORAL
  Filled 2022-05-22: qty 4

## 2022-05-22 MED ORDER — SODIUM CHLORIDE 0.9 % IV SOLN
2.0000 g | INTRAVENOUS | Status: DC
  Administered 2022-05-22: 2 g via INTRAVENOUS
  Filled 2022-05-22: qty 20

## 2022-05-22 MED ORDER — INSULIN ASPART 100 UNIT/ML IJ SOLN
0.0000 [IU] | Freq: Three times a day (TID) | INTRAMUSCULAR | Status: DC
  Administered 2022-05-22 – 2022-05-23 (×2): 11 [IU] via SUBCUTANEOUS
  Administered 2022-05-23 – 2022-05-24 (×2): 4 [IU] via SUBCUTANEOUS
  Administered 2022-05-24: 7 [IU] via SUBCUTANEOUS
  Administered 2022-05-24 – 2022-05-25 (×2): 4 [IU] via SUBCUTANEOUS
  Administered 2022-05-25 (×2): 15 [IU] via SUBCUTANEOUS
  Administered 2022-05-26: 7 [IU] via SUBCUTANEOUS
  Administered 2022-05-26 (×2): 11 [IU] via SUBCUTANEOUS

## 2022-05-22 MED ORDER — BUDESONIDE 0.5 MG/2ML IN SUSP
2.0000 mg | Freq: Two times a day (BID) | RESPIRATORY_TRACT | Status: DC
  Administered 2022-05-22 – 2022-05-30 (×15): 2 mg via RESPIRATORY_TRACT
  Filled 2022-05-22 (×13): qty 8

## 2022-05-22 MED ORDER — SODIUM CHLORIDE 0.9 % IV SOLN
500.0000 mg | INTRAVENOUS | Status: DC
  Administered 2022-05-22: 500 mg via INTRAVENOUS
  Filled 2022-05-22: qty 5

## 2022-05-22 MED ORDER — GUAIFENESIN-DM 100-10 MG/5ML PO SYRP
10.0000 mL | ORAL_SOLUTION | ORAL | Status: DC | PRN
  Administered 2022-05-23 – 2022-05-24 (×4): 10 mL via ORAL
  Filled 2022-05-22 (×4): qty 10

## 2022-05-22 NOTE — Sepsis Progress Note (Signed)
Elink following code sepsis °

## 2022-05-22 NOTE — ED Provider Notes (Signed)
[place] EMERGENCY DEPARTMENT AT [facility] Provider Note  CSN: [id number] Arrival date & time: 05/22/22 1052  Chief Complaint(s) Fall and Shortness of Breath  HPI Briana Foster is a 77 y.o. female with past medical history as below, significant for obesity, TAVR 2022, afib, htn, HLD, asthma, diastolic HF,  who presents to the ED with complaint of cough, dib, fall. Pt with dib, possible fall pta. Per family not acting herself last 2 months, difficulty with ambulation last 2 months, recurrent falls, variable compliance with medications. On arrival audible wheezing, given nebs en route and placed on 2LNC. 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 Clifton James & Dr. Cornelius Moras   Severe aortic stenosis    Type 2 diabetes mellitus without complication, without long-term current use of insulin 04/10/2018   Vitamin D deficiency    Patient Active Problem List   Diagnosis Date Noted   Leukocytosis 02/01/2022   Transient hypotension 02/01/2022   AKI (acute kidney injury) 02/01/2022   Chronic anticoagulation 02/01/2022   Weakness 02/01/2022   Nephrolithiasis 02/01/2022   History of COVID-19 02/01/2022   COVID-19 virus infection 01/26/2022   Chronic diastolic CHF (congestive heart failure) 01/26/2022   Acute pyelonephritis 11/07/2021   UTI (urinary  tract infection) 11/07/2021   Hyperlipidemia 11/05/2021   Abdominal pain, right lower quadrant 11/05/2021   Abdominal pain 11/04/2021   Near syncope 10/27/2020   Atrial fibrillation with slow ventricular response 10/27/2020   LBBB (left bundle branch block) 10/27/2020   Carotid arterial disease    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 Authorizing Provider  acetaminophen (TYLENOL) 500 MG tablet Take 500-1,000 mg by mouth every 8 (eight) hours as needed for moderate pain.    [provider]  albuterol (VENTOLIN HFA) 108 (90 Base) MCG/ACT inhaler Inhale 2 puffs into the lungs every 4 (four) hours as needed for wheezing or shortness of breath.    [provider]  amLODipine (NORVASC) 10 MG tablet Take 1 tablet (10 mg total) by mouth daily. 11/03/21   Gaston Islam., NP  apixaban (ELIQUIS) 5 MG TABS tablet Take 1 tablet (5 mg total) by mouth 2 (two) times daily. 06/18/21   Katrinka Blazing,  Barry Dienes, MD  benzonatate (TESSALON) 200 MG capsule Take 200 mg by mouth 3 (three) times daily as needed for cough. 11/12/21   [provider]  cetirizine (ZYRTEC) 10 MG tablet Take 10 mg by mouth daily as needed for allergies.    [provider]  Cyanocobalamin (VITAMIN B-12 PO) Take 1 tablet by mouth daily.    [provider]  diclofenac Sodium (VOLTAREN) 1 % GEL Apply 2 g topically as needed (for pain).    [provider]   guaiFENesin-dextromethorphan (ROBITUSSIN DM) 100-10 MG/5ML syrup Take 10 mLs by mouth every 4 (four) hours as needed for cough. 01/28/22   Joseph Art, DO  meclizine (ANTIVERT) 25 MG tablet Take 25 mg by mouth 2 (two) times daily as needed (vertigo). 11/12/20   [provider]  Multiple Vitamins-Minerals (MULTIVITAMIN WITH MINERALS) tablet Take 1 tablet by mouth daily.    [provider]  olmesartan (BENICAR) 40 MG tablet Take 1 tablet (40 mg total) by mouth daily. 03/21/22   Gerhard Munch, MD  rOPINIRole (REQUIP) 4 MG tablet Take 4 mg by mouth at bedtime.    [provider]                                                                                                                                    Past Surgical History Past Surgical History:  Procedure Laterality Date   AV NODE ABLATION N/A 07/14/2021   Procedure: AV NODE ABLATION;  Surgeon: Regan Lemming, MD;  Location: [facility];  Service: Cardiovascular;  Laterality: N/A;   BUNIONECTOMY  20 yrs ago   bil feet   BUNIONECTOMY  11/30/2011   Procedure: BUNIONECTOMY;  Surgeon: Drucilla Schmidt, MD;  Location: [facility] ORS;  Service: Orthopedics;  Laterality: Bilateral;  RIGHT FOOT EXCISION OF BUNIONETTE AND PARTIAL   PROXIMAL PHALANGECTOMY OF 5TH TOE LEFT FOOT FUNK BUNIONECTOMY,EXCISION OF BUNIONETTE    CARDIOVERSION N/A 11/05/2019   Procedure: CARDIOVERSION;  Surgeon: Lewayne Bunting, MD;  Location: [facility] ENDOSCOPY;  Service: Cardiovascular;  Laterality: N/A;   CARDIOVERSION N/A 12/05/2019   Procedure: CARDIOVERSION;  Surgeon: Parke Poisson, MD;  Location: [facility] ENDOSCOPY;  Service: Cardiovascular;  Laterality: N/A;   COLONOSCOPY WITH PROPOFOL N/A 08/07/2012   Procedure: COLONOSCOPY WITH PROPOFOL;  Surgeon: Charolett Bumpers, MD;  Location: [facility] ENDOSCOPY;  Service: Endoscopy;  Laterality: N/A;   MOUTH SURGERY     teeth extractions   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   Procedure: PACEMAKER IMPLANT;   Surgeon: Regan Lemming, MD;  Location: [facility];  Service: Cardiovascular;  Laterality: N/A;   RIGHT/LEFT HEART CATH AND CORONARY ANGIOGRAPHY N/A 06/17/2020   Procedure: RIGHT/LEFT HEART CATH AND CORONARY ANGIOGRAPHY;  Surgeon: Lyn Records, MD;  Location: [facility];  Service: Cardiovascular;  Laterality: N/A;   TONSILLECTOMY     TRANSCATHETER AORTIC VALVE REPLACEMENT, TRANSFEMORAL N/A 06/30/2020  Procedure: TRANSCATHETER AORTIC VALVE REPLACEMENT, TRANSFEMORAL;  Surgeon: Kathleene Hazel, MD;  Location: [facility];  Service: Open Heart Surgery;  Laterality: N/A;   TUBAL LIGATION     WRIST SURGERY     Family History Family History  Problem Relation Age of Onset   Heart disease Mother    Stroke Mother    Cancer Father        Prostate    Social History Social History   Tobacco Use   Smoking status: Never   Smokeless tobacco: Never  Vaping Use   Vaping Use: Never used  Substance Use Topics   Alcohol use: No   Drug use: No   Allergies Bee venom, Claritin [loratadine], Wasp venom, Apricot flavor, and Atorvastatin  Review of Systems Review of Systems  Unable to perform ROS: Severe respiratory distress  Constitutional:  Positive for chills and fever.  Respiratory:  Positive for cough and shortness of breath.   Musculoskeletal:  Positive for arthralgias.    Physical Exam Vital Signs  I have reviewed the triage vital signs BP (!) 153/103   Pulse 61   Temp (!) 100.8 F (38.2 C) (Oral)   Resp (!) 24   Ht  (1.727 m)   Wt 90.7 kg   SpO2 96%   BMI 30.41 kg/m  Physical Exam Vitals and nursing note reviewed.  Constitutional:      General: She is not in acute distress.    Appearance: Normal appearance. She is ill-appearing.  HENT:     Head: Normocephalic and atraumatic.     Right Ear: External ear normal.     Left Ear: External ear normal.     Nose: Nose normal.     Mouth/Throat:     Mouth: Mucous membranes are moist.   Eyes:     General: No scleral icterus.       Right eye: No discharge.        Left eye: No discharge.  Cardiovascular:     Rate and Rhythm: Normal rate and regular rhythm.     Pulses: Normal pulses.     Heart sounds: Normal heart sounds.  Pulmonary:     Effort: Tachypnea and accessory muscle usage present. No respiratory distress.     Breath sounds: Decreased breath sounds and wheezing present.  Chest:    Abdominal:     General: Abdomen is flat.     Tenderness: There is no abdominal tenderness.  Musculoskeletal:     Right lower leg: Edema present.     Left lower leg: Edema present.  Skin:    General: Skin is warm and dry.     Capillary Refill: Capillary refill takes less than 2 seconds.  Neurological:     Mental Status: She is alert.  Psychiatric:        Mood and Affect: Mood normal.        Behavior: Behavior normal.     ED Results and Treatments Labs (all labs ordered are listed, but only abnormal results are displayed) Labs Reviewed  LACTIC ACID, PLASMA - Abnormal; Notable for the following components:      Result Value   Lactic Acid, Venous 2.9 (*)    All other components within normal limits  COMPREHENSIVE METABOLIC PANEL - Abnormal; Notable for the following components:   Potassium 3.2 (*)    CO2 20 (*)    Glucose, Bld 207 (*)    Total Bilirubin 1.3 (*)    All other components within normal  limits  PROTIME-INR - Abnormal; Notable for the following components:   Prothrombin Time 15.8 (*)    INR 1.3 (*)    All other components within normal limits  BRAIN NATRIURETIC PEPTIDE - Abnormal; Notable for the following components:   B Natriuretic Peptide 447.7 (*)    All other components within normal limits  I-STAT VENOUS BLOOD GAS, ED - Abnormal; Notable for the following components:   pCO2, Ven 37.1 (*)    Acid-base deficit 4.0 (*)    Potassium 3.2 (*)    Calcium, Ion 1.14 (*)    All other components within normal limits  RESP PANEL BY RT-PCR (RSV, FLU A&B,  COVID)  RVPGX2  CULTURE, BLOOD (ROUTINE X 2)  CULTURE, BLOOD (ROUTINE X 2)  EXPECTORATED SPUTUM ASSESSMENT W GRAM STAIN, RFLX TO RESP C  CBC WITH DIFFERENTIAL/PLATELET  APTT  TSH  T4, FREE  LACTIC ACID, PLASMA  URINALYSIS, W/ REFLEX TO CULTURE (INFECTION SUSPECTED)  TROPONIN I (HIGH SENSITIVITY)  TROPONIN I (HIGH SENSITIVITY)                                                                                                                          Radiology DG Hips Bilat W or Wo Pelvis 3-4 Views  Result Date: 05/22/2022 CLINICAL DATA:  Pain after fall EXAM: DG HIP (WITH OR WITHOUT PELVIS) 4V BILAT COMPARISON:  None Available. FINDINGS: There is no evidence of hip fracture or dislocation. There is no evidence of arthropathy or other focal bone abnormality. IMPRESSION: No acute osseous abnormality Electronically Signed   By: Karen Kays M.D.   On: 05/22/2022 13:09   CT Head Wo Contrast  Result Date: 05/22/2022 CLINICAL DATA:  Head trauma, minor (Age >= 65y); Neck trauma (Age >= 65y) EXAM: CT HEAD WITHOUT CONTRAST CT CERVICAL SPINE WITHOUT CONTRAST TECHNIQUE: Multidetector CT imaging of the head and cervical spine was performed following the standard protocol without intravenous contrast. Multiplanar CT image reconstructions of the cervical spine were also generated. RADIATION DOSE REDUCTION: This exam was performed according to the departmental dose-optimization program which includes automated exposure control, adjustment of the mA and/or kV according to patient size and/or use of iterative reconstruction technique. COMPARISON:  01/26/2022 FINDINGS: CT HEAD FINDINGS Brain: No evidence of acute infarction, hemorrhage, hydrocephalus, extra-axial collection or mass lesion/mass effect. Age related cerebral volume loss. Vascular: No hyperdense vessel or unexpected calcification. Skull: Normal. Negative for fracture or focal lesion. Sinuses/Orbits: No acute finding. Other: None. CT CERVICAL SPINE  FINDINGS Technical note: Images are degraded by patient motion artifact. Alignment: Facet joints are aligned without dislocation or traumatic listhesis. Dens and lateral masses are aligned. Skull base and vertebrae: No acute fracture. No primary bone lesion or focal pathologic process. Soft tissues and spinal canal: No prevertebral fluid or swelling. No visible canal hematoma. Disc levels: Degenerative disc disease of C5-6. Advanced multilevel bilateral facet arthropathy. Upper chest: Negative. Other: Bilateral carotid atherosclerosis. IMPRESSION: 1. No acute intracranial abnormality. 2. No acute  fracture or subluxation of the cervical spine. Electronically Signed   By: Duanne Guess D.O.   On: 05/22/2022 12:54   CT Cervical Spine Wo Contrast  Result Date: 05/22/2022 CLINICAL DATA:  Head trauma, minor (Age >= 65y); Neck trauma (Age >= 65y) EXAM: CT HEAD WITHOUT CONTRAST CT CERVICAL SPINE WITHOUT CONTRAST TECHNIQUE: Multidetector CT imaging of the head and cervical spine was performed following the standard protocol without intravenous contrast. Multiplanar CT image reconstructions of the cervical spine were also generated. RADIATION DOSE REDUCTION: This exam was performed according to the departmental dose-optimization program which includes automated exposure control, adjustment of the mA and/or kV according to patient size and/or use of iterative reconstruction technique. COMPARISON:  01/26/2022 FINDINGS: CT HEAD FINDINGS Brain: No evidence of acute infarction, hemorrhage, hydrocephalus, extra-axial collection or mass lesion/mass effect. Age related cerebral volume loss. Vascular: No hyperdense vessel or unexpected calcification. Skull: Normal. Negative for fracture or focal lesion. Sinuses/Orbits: No acute finding. Other: None. CT CERVICAL SPINE FINDINGS Technical note: Images are degraded by patient motion artifact. Alignment: Facet joints are aligned without dislocation or traumatic listhesis. Dens and  lateral masses are aligned. Skull base and vertebrae: No acute fracture. No primary bone lesion or focal pathologic process. Soft tissues and spinal canal: No prevertebral fluid or swelling. No visible canal hematoma. Disc levels: Degenerative disc disease of C5-6. Advanced multilevel bilateral facet arthropathy. Upper chest: Negative. Other: Bilateral carotid atherosclerosis. IMPRESSION: 1. No acute intracranial abnormality. 2. No acute fracture or subluxation of the cervical spine. Electronically Signed   By: Duanne Guess D.O.   On: 05/22/2022 12:54   DG Chest Port 1 View  Result Date: 05/22/2022 CLINICAL DATA:  Sepsis EXAM: PORTABLE CHEST 1 VIEW COMPARISON:  03/21/2022 FINDINGS: Single lead left-sided implanted cardiac device remains in place. Stable cardiomegaly status post TAVR. Aortic atherosclerosis. Coarsened interstitial markings bilaterally with new patchy right lower lobe airspace opacity. No pleural effusion or pneumothorax. IMPRESSION: New patchy right lower lobe airspace opacity, suspicious for pneumonia. Electronically Signed   By: Duanne Guess D.O.   On: 05/22/2022 11:55    Pertinent labs & imaging results that were available during my care of the patient were reviewed by me and considered in my medical decision making (see MDM for details).  Medications Ordered in ED Medications  cefTRIAXone (ROCEPHIN) 2 g in sodium chloride 0.9 % 100 mL IVPB (0 g Intravenous Stopped 05/22/22 1143)  azithromycin (ZITHROMAX) 500 mg in sodium chloride 0.9 % 250 mL IVPB (0 mg Intravenous Stopped 05/22/22 1305)  potassium chloride 10 mEq in 100 mL IVPB (10 mEq Intravenous New Bag/Given 05/22/22 1330)  acetaminophen (TYLENOL) tablet 650 mg (has no administration in time range)  lactated ringers bolus 1,000 mL (has no administration in time range)  potassium chloride SA (KLOR-CON M) CR tablet 40 mEq (has no administration in time range)  lactated ringers bolus 1,000 mL (0 mLs Intravenous Stopped  05/22/22 1234)  ipratropium-albuterol (DUONEB) 0.5-2.5 (3) MG/3ML nebulizer solution 3 mL (3 mLs Nebulization Given 05/22/22 1125)  methylPREDNISolone sodium succinate (SOLU-MEDROL) 125 mg/2 mL injection 125 mg (125 mg Intravenous Given 05/22/22 1120)  magnesium sulfate IVPB 2 g 50 mL (0 g Intravenous Stopped 05/22/22 1219)  Procedures .Critical Care  Performed by: Sloan Leiter, DO Authorized by: Sloan Leiter, DO   Critical care provider statement:    Critical care time (minutes):  55   Critical care time was exclusive of:  Separately billable procedures and treating other patients   Critical care was necessary to treat or prevent imminent or life-threatening deterioration of the following conditions:  Sepsis and respiratory failure   Critical care was time spent personally by me on the following activities:  Development of treatment plan with patient or surrogate, discussions with consultants, evaluation of patient's response to treatment, examination of patient, ordering and review of laboratory studies, ordering and review of radiographic studies, ordering and performing treatments and interventions, pulse oximetry, re-evaluation of patient's condition, review of old charts and obtaining history from patient or surrogate   Care discussed with: admitting provider     (including critical care time)  Medical Decision Making / ED Course    Medical Decision Making:    ATHALENE KOLLE is a 77 y.o. female with past medical history as below, significant for obesity, TAVR 2022, afib, htn, HLD, asthma, diastolic HF,  who presents to the ED with complaint of cough, dib, fall. . The complaint involves an extensive differential diagnosis and also carries with it a high risk of complications and morbidity.  Serious etiology was considered. Ddx includes but is not limited  to: In my evaluation of this patient's dyspnea my DDx includes, but is not limited to, pneumonia, pulmonary embolism, pneumothorax, pulmonary edema, metabolic acidosis, asthma, COPD, cardiac cause, anemia, anxiety, etc.  Differential diagnosis for adult fever includes but is not exclusive to community-acquired pneumonia, urinary tract infection, acute cholecystitis, viral syndrome, cellulitis, tick bourne disease,  decubitus ulcer, necrotizing fasciitis, meningitis, encephalitis, influenza, etc.   Complete initial physical exam performed, notably the patient  was resp distress, wheezing throughout, improved w/ 4L [place] initially then able to come down to 2L [place] Reviewed and confirmed nursing documentation for past medical history, family history, social history.  Vital signs reviewed.    Clinical Course as of 05/22/22 1353  Sun May 22, 2022  1233 CXR w/ pna, febrile on arrival, tachycardia, tachypnea. Code sepsis alerted, bundle ordered  [SG]    Clinical Course User Index [SG] Tanda Rockers A, DO   Pt with DIB, fever, sepsis on arrival Concern for PNA, rocephin/azithro ordered with cultures Give apap for fever Continue [place], wheezing improved with nebs K is low, replace IV CTH/c-spine stable, no traumatic injury Hip xr wnl Cxr w/ pna Continue 2LNC Pt improved on re-assessment, hemodynamics stable  Pt with sepsis 2/2 PNA, pos asthma exacerbation, new O2 requirement, will admit         Additional history obtained: -Additional history obtained from ems -External records from outside source obtained and reviewed including: Chart review including previous notes, labs, imaging, consultation notes including home meds, prior labs/imaging, prior ed visits    Lab Tests: -I ordered, reviewed, and interpreted labs.   The pertinent results include:   Labs Reviewed  LACTIC ACID, PLASMA - Abnormal; Notable for the following components:      Result Value   Lactic Acid, Venous 2.9 (*)    All  other components within normal limits  COMPREHENSIVE METABOLIC PANEL - Abnormal; Notable for the following components:   Potassium 3.2 (*)    CO2 20 (*)    Glucose, Bld 207 (*)    Total Bilirubin 1.3 (*)    All other components within normal  limits  PROTIME-INR - Abnormal; Notable for the following components:   Prothrombin Time 15.8 (*)    INR 1.3 (*)    All other components within normal limits  BRAIN NATRIURETIC PEPTIDE - Abnormal; Notable for the following components:   B Natriuretic Peptide 447.7 (*)    All other components within normal limits  I-STAT VENOUS BLOOD GAS, ED - Abnormal; Notable for the following components:   pCO2, Ven 37.1 (*)    Acid-base deficit 4.0 (*)    Potassium 3.2 (*)    Calcium, Ion 1.14 (*)    All other components within normal limits  RESP PANEL BY RT-PCR (RSV, FLU A&B, COVID)  RVPGX2  CULTURE, BLOOD (ROUTINE X 2)  CULTURE, BLOOD (ROUTINE X 2)  EXPECTORATED SPUTUM ASSESSMENT W GRAM STAIN, RFLX TO RESP C  CBC WITH DIFFERENTIAL/PLATELET  APTT  TSH  T4, FREE  LACTIC ACID, PLASMA  URINALYSIS, W/ REFLEX TO CULTURE (INFECTION SUSPECTED)  TROPONIN I (HIGH SENSITIVITY)  TROPONIN I (HIGH SENSITIVITY)    Notable for as above  EKG   EKG Interpretation  Date/Time:    Ventricular Rate:    PR Interval:    QRS Duration:   QT Interval:    QTC Calculation:   R Axis:     Text Interpretation:           Imaging Studies ordered: I ordered imaging studies including cth ctcs, cxr, pelvis xr I independently visualized the following imaging with scope of interpretation limited to determining acute life threatening conditions related to emergency care; findings noted above, significant for pna I independently visualized and interpreted imaging. I agree with the radiologist interpretation   Medicines ordered and prescription drug management: Meds ordered this encounter  Medications   lactated ringers bolus 1,000 mL    Order Specific Question:    Reason 30 mL/kg dose is not being ordered    Answer:   First Lactic Acid Pending   cefTRIAXone (ROCEPHIN) 2 g in sodium chloride 0.9 % 100 mL IVPB    Order Specific Question:   Antibiotic Indication:    Answer:   CAP   azithromycin (ZITHROMAX) 500 mg in sodium chloride 0.9 % 250 mL IVPB    Order Specific Question:   Antibiotic Indication:    Answer:   CAP   ipratropium-albuterol (DUONEB) 0.5-2.5 (3) MG/3ML nebulizer solution 3 mL   methylPREDNISolone sodium succinate (SOLU-MEDROL) 125 mg/2 mL injection 125 mg    IV methylprednisolone will be converted to either a q12h or q24h frequency with the same total daily dose (TDD).  Ordered Dose: 1 to 125 mg TDD; convert to: TDD q24h.  Ordered Dose: 126 to 250 mg TDD; convert to: TDD div q12h.  Ordered Dose: >250 mg TDD; DAW.   magnesium sulfate IVPB 2 g 50 mL   potassium chloride 10 mEq in 100 mL IVPB   acetaminophen (TYLENOL) tablet 650 mg   lactated ringers bolus 1,000 mL   potassium chloride SA (KLOR-CON M) CR tablet 40 mEq    -I have reviewed the patients home medicines and have made adjustments as needed   Consultations Obtained: na   Cardiac Monitoring: The patient was maintained on a cardiac monitor.  I personally viewed and interpreted the cardiac monitored which showed an underlying rhythm of: NSR  Social Determinants of Health:  Diagnosis or treatment significantly limited by social determinants of health: obesity   Reevaluation: After the interventions noted above, I reevaluated the patient and found that they have improved  Co morbidities that complicate the patient evaluation  Past Medical History:  Diagnosis Date   Anemia    Arthritis 07-20-12   hands   Asthma    Back pain    Carotid arterial disease    Class 1 obesity with serious comorbidity and body mass index (BMI) of 31.0 to 31.9 in adult 03/26/2018   Depression    Essential hypertension 03/26/2018   GERD (gastroesophageal reflux disease)    "comes and goes"  - no meds currently   Hyperlipidemia    Joint pain    Persistent atrial fibrillation    Restless leg syndrome    Rheumatoid arthritis    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 Clifton James & Dr. Cornelius Moras   Severe aortic stenosis    Type 2 diabetes mellitus without complication, without long-term current use of insulin 04/10/2018   Vitamin D deficiency       Dispostion: Disposition decision including need for hospitalization was considered, and patient admitted to the hospital.    Final Clinical Impression(s) / ED Diagnoses Final diagnoses:  Community acquired pneumonia, unspecified laterality  Respiratory distress  Hypokalemia  Sepsis, due to unspecified organism, unspecified whether acute organ dysfunction present  Exacerbation of persistent asthma, unspecified asthma severity     This chart was dictated using voice recognition software.  Despite best efforts to proofread,  errors can occur which can change the documentation meaning.    Sloan Leiter, DO 05/22/22 1354

## 2022-05-22 NOTE — ED Triage Notes (Signed)
Per [facility]- called out for fall and placed back in bed but had notable wheezing. Was given  10 mg albuterol and /5 atrovent en route. Patient has been altered since February. Hx of DM, HTN and CHF. Unable to obtain IV access en route.

## 2022-05-22 NOTE — ED Notes (Signed)
ED TO INPATIENT HANDOFF REPORT  ED Nurse Name and Phone #:   S Name/Age/Gender Lewis Shock 77 y.o. female Room/Bed: 033C/033C  Code Status   Code Status: Full Code  Home/SNF/Other Home Patient oriented to: self, place, time, and situation Is this baseline? Yes   Triage Complete: Triage complete  Chief Complaint Pneumonia [J18.9]  Triage Note Per [facility]- called out for fall and placed back in bed but had notable wheezing. Was given  10 mg albuterol and /5 atrovent en route. Patient has been altered since February. Hx of DM, HTN and CHF. Unable to obtain IV access en route.    Allergies Allergies  Allergen Reactions   Bee Venom Shortness Of Breath and Rash   Claritin [Loratadine] Itching and Palpitations   Wasp Venom Other (See Comments) and Anaphylaxis   Apricot Flavor Swelling    Eyes swell shut   Atorvastatin Itching    Eye swelling Other reaction(s): HA    Level of Care/Admitting Diagnosis ED Disposition     ED Disposition  Admit   Condition  --   Comment  Hospital Area: [facility] [[id number]]  Level of Care: Telemetry Medical [104]  May admit patient to [facility] or [facility] if equivalent level of care is available:: No  Covid Evaluation: Asymptomatic - no recent exposure (last 10 days) testing not required  Diagnosis: Pneumonia [227785]  Admitting Physician: Em[facility] General [[id number]]  Attending Physician: [facility] General [[id number]]  Certification:: I certify this patient will need inpatient services for at least 2 midnights  Estimated Length of Stay: 2          B Medical/Surgery History Past Medical History:  Diagnosis Date   Anemia    Arthritis 07-20-12   hands   Asthma    Back pain    Carotid arterial disease    Class 1 obesity with serious comorbidity and body mass index (BMI) of 31.0 to 31.9 in adult 03/26/2018   Depression    Essential hypertension 03/26/2018   GERD (gastroesophageal reflux disease)    "comes and  goes" - no meds currently   Hyperlipidemia    Joint pain    Persistent atrial fibrillation    Restless leg syndrome    Rheumatoid arthritis    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 Clifton James & Dr. Cornelius Moras   Severe aortic stenosis    Type 2 diabetes mellitus without complication, without long-term current use of insulin 04/10/2018   Vitamin D deficiency    Past Surgical History:  Procedure Laterality Date   AV NODE ABLATION N/A 07/14/2021   Procedure: AV NODE ABLATION;  Surgeon: Regan Lemming, MD;  Location: [facility];  Service: Cardiovascular;  Laterality: N/A;   BUNIONECTOMY  20 yrs ago   bil feet   BUNIONECTOMY  11/30/2011   Procedure: BUNIONECTOMY;  Surgeon: Drucilla Schmidt, MD;  Location: [facility] ORS;  Service: Orthopedics;  Laterality: Bilateral;  RIGHT FOOT EXCISION OF BUNIONETTE AND PARTIAL   PROXIMAL PHALANGECTOMY OF 5TH TOE LEFT FOOT FUNK BUNIONECTOMY,EXCISION OF BUNIONETTE    CARDIOVERSION N/A 11/05/2019   Procedure: CARDIOVERSION;  Surgeon: Lewayne Bunting, MD;  Location: [facility] ENDOSCOPY;  Service: Cardiovascular;  Laterality: N/A;   CARDIOVERSION N/A 12/05/2019   Procedure: CARDIOVERSION;  Surgeon: Parke Poisson, MD;  Location: [facility] ENDOSCOPY;  Service: Cardiovascular;  Laterality: N/A;   COLONOSCOPY WITH PROPOFOL N/A  08/07/2012   Procedure: COLONOSCOPY WITH PROPOFOL;  Surgeon: Charolett Bumpers, MD;  Location: [facility] ENDOSCOPY;  Service: Endoscopy;  Laterality: N/A;   MOUTH SURGERY     teeth extractions   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   Procedure: PACEMAKER IMPLANT;  Surgeon: Regan Lemming, MD;  [facility]: [facility];  Service: Cardiovascular;  Laterality: N/A;   RIGHT/LEFT HEART CATH AND CORONARY ANGIOGRAPHY N/A 06/17/2020   Procedure: RIGHT/LEFT HEART CATH AND CORONARY ANGIOGRAPHY;  Surgeon: Lyn Records, MD;  Location: [facility];  Service: Cardiovascular;   Laterality: N/A;   TONSILLECTOMY     TRANSCATHETER AORTIC VALVE REPLACEMENT, TRANSFEMORAL N/A 06/30/2020   Procedure: TRANSCATHETER AORTIC VALVE REPLACEMENT, TRANSFEMORAL;  Surgeon: Kathleene Hazel, MD;  Location: [facility];  Service: Open Heart Surgery;  Laterality: N/A;   TUBAL LIGATION     WRIST SURGERY       A IV Location/Drains/Wounds Patient Lines/Drains/Airways Status     Active Line/Drains/Airways     Name Placement date Placement time Site Days   Peripheral IV 05/22/22 20 G 1" Anterior;Proximal;Right Forearm 05/22/22  1110  Forearm  less than 1   Peripheral IV 05/22/22 18 G Posterior;Right Forearm 05/22/22  1135  Forearm  less than 1            Intake/Output Last 24 hours No intake or output data in the 24 hours ending 05/22/22 1408  Labs/Imaging Results for orders placed or performed during the hospital encounter of 05/22/22 (from the past 48 hour(s))  Resp panel by RT-PCR (RSV, Flu A&B, Covid) Anterior Nasal Swab     Status: None   Collection Time: 05/22/22 11:05 AM   Specimen: Anterior Nasal Swab  Result Value Ref Range   SARS Coronavirus 2 by RT PCR NEGATIVE NEGATIVE   Influenza A by PCR NEGATIVE NEGATIVE   Influenza B by PCR NEGATIVE NEGATIVE    Comment: (NOTE) The Xpert Xpress SARS-CoV-2/FLU/RSV plus assay is intended as an aid in the diagnosis of influenza from Nasopharyngeal swab specimens and should not be used as a sole basis for treatment. Nasal washings and aspirates are unacceptable for Xpert Xpress SARS-CoV-2/FLU/RSV testing.  Fact Sheet for Patients: BloggerCourse.com  Fact Sheet for Healthcare Providers: SeriousBroker.it  This test is not yet approved or cleared by the [place] FDA and has been authorized for detection and/or diagnosis of SARS-CoV-2 by FDA under an Emergency Use Authorization (EUA). This EUA will remain in effect (meaning this test can be used) for the  duration of the COVID-19 declaration under Section 564(b)(1) of the Act, 21 U.S.C. section 360bbb-3(b)(1), unless the authorization is terminated or revoked.     Resp Syncytial Virus by PCR NEGATIVE NEGATIVE    Comment: (NOTE) Fact Sheet for Patients: BloggerCourse.com  Fact Sheet for Healthcare Providers: SeriousBroker.it  This test is not yet approved or cleared by the [place] FDA and has been authorized for detection and/or diagnosis of SARS-CoV-2 by FDA under an Emergency Use Authorization (EUA). This EUA will remain in effect (meaning this test can be used) for the duration of the COVID-19 declaration under Section 564(b)(1) of the Act, 21 U.S.C. section 360bbb-3(b)(1), unless the authorization is terminated or revoked.  Performed at [facility], [address] [place], [place] [address]   Lactic acid, plasma     Status: Abnormal   Collection Time: 05/22/22 11:10 AM  Result Value Ref Range   Lactic Acid, Venous 2.9 (HH) 0.5 - 1.9 mmol/L  Comment: CRITICAL RESULT CALLED TO, READ BACK BY Dorene Grebe, RN AND VERIFIED WITH W SMITH AT 1230 ON 4.21.2024 Performed at [facility], [address], [place], [place] [address]   Comprehensive metabolic panel     Status: Abnormal   Collection Time: 05/22/22 11:10 AM  Result Value Ref Range   Sodium 136 135 - 145 mmol/L   Potassium 3.2 (L) 3.5 - 5.1 mmol/L   Chloride 101 98 - 111 mmol/L   CO2 20 (L) 22 - 32 mmol/L   Glucose, Bld 207 (H) 70 - 99 mg/dL    Comment: Glucose reference range applies only to samples taken after fasting for at least 8 hours.   BUN 11 8 - 23 mg/dL   Creatinine, Ser 6.04 0.44 - 1.00 mg/dL   Calcium 9.3 8.9 - 54.0 mg/dL   Total Protein 7.1 6.5 - 8.1 g/dL   Albumin 4.0 3.5 - 5.0 g/dL   AST 32 15 - 41 U/L   ALT 30 0 - 44 U/L   Alkaline Phosphatase 72 38 - 126 U/L   Total Bilirubin 1.3 (H) 0.3 - 1.2 mg/dL   GFR, Estimated >98 >11  mL/min    Comment: (NOTE) Calculated using the CKD-EPI Creatinine Equation (2021)    Anion gap 15 5 - 15    Comment: Performed at [facility], [address], [place], [place] [address]  CBC with Differential     Status: None   Collection Time: 05/22/22 11:10 AM  Result Value Ref Range   WBC 8.8 4.0 - 10.5 K/uL   RBC 4.30 3.87 - 5.11 MIL/uL   Hemoglobin 13.2 12.0 - 15.0 g/dL   HCT 29.5 62.1 - 30.8 %   MCV 94.4 80.0 - 100.0 fL   MCH 30.7 26.0 - 34.0 pg   MCHC 32.5 30.0 - 36.0 g/dL   RDW 65.7 84.6 - 96.2 %   Platelets 156 150 - 400 K/uL   nRBC 0.0 0.0 - 0.2 %   Neutrophils Relative % 74 %   Neutro Abs 6.5 1.7 - 7.7 K/uL   Lymphocytes Relative 13 %   Lymphs Abs 1.2 0.7 - 4.0 K/uL   Monocytes Relative 12 %   Monocytes Absolute 1.0 0.1 - 1.0 K/uL   Eosinophils Relative 0 %   Eosinophils Absolute 0.0 0.0 - 0.5 K/uL   Basophils Relative 1 %   Basophils Absolute 0.1 0.0 - 0.1 K/uL   Immature Granulocytes 0 %   Abs Immature Granulocytes 0.02 0.00 - 0.07 K/uL    Comment: Performed at [facility], [address], [place], [place] [address]  Protime-INR     Status: Abnormal   Collection Time: 05/22/22 11:10 AM  Result Value Ref Range   Prothrombin Time 15.8 (H) 11.4 - 15.2 seconds   INR 1.3 (H) 0.8 - 1.2    Comment: (NOTE) INR goal varies based on device and disease states. Performed at [facility], [address] [place], [place] [address]   APTT     Status: None   Collection Time: 05/22/22 11:10 AM  Result Value Ref Range   aPTT 31 24 - 36 seconds    Comment: Performed at [facility], [address], [place], [place] [address]  Brain natriuretic peptide     Status: Abnormal   Collection Time: 05/22/22 11:10 AM  Result Value Ref Range   B Natriuretic Peptide 447.7 (H) 0.0 - 100.0 pg/mL  Comment: Performed at [facility], [address] [place], [place] [address]  Troponin I (High Sensitivity)     Status: None   Collection Time: 05/22/22  11:10 AM  Result Value Ref Range   Troponin I (High Sensitivity) 15 <18 ng/L    Comment: (NOTE) Elevated high sensitivity troponin I (hsTnI) values and significant  changes across serial measurements may suggest ACS but many other  chronic and acute conditions are known to elevate hsTnI results.  Refer to the "Links" section for chest pain algorithms and additional  guidance. Performed at [facility], [address], [place], [place] [address]   TSH     Status: None   Collection Time: 05/22/22 11:10 AM  Result Value Ref Range   TSH 0.512 0.350 - 4.500 uIU/mL    Comment: Performed by a 3rd Generation assay with a functional sensitivity of <=0.01 uIU/mL. Performed at [facility], [address], [place], [place] [address]   T4, free     Status: None   Collection Time: 05/22/22 11:10 AM  Result Value Ref Range   Free T4 0.86 0.61 - 1.12 ng/dL    Comment: (NOTE) Biotin ingestion may interfere with free T4 tests. If the results are inconsistent with the TSH level, previous test results, or the clinical presentation, then consider biotin interference. If needed, order repeat testing after stopping biotin. Performed at [facility], [address], [place], [place] [address]   I-Stat venous blood gas, ED ([facility])     Status: Abnormal   Collection Time: 05/22/22 11:20 AM  Result Value Ref Range   pH, Ven 7.364 7.25 - 7.43   pCO2, Ven 37.1 (L) 44 - 60 mmHg   pO2, Ven 38 32 - 45 mmHg   Bicarbonate 21.1 20.0 - 28.0 mmol/L   TCO2 22 22 - 32 mmol/L   O2 Saturation 70 %   Acid-base deficit 4.0 (H) 0.0 - 2.0 mmol/L   Sodium 138 135 - 145 mmol/L   Potassium 3.2 (L) 3.5 - 5.1 mmol/L   Calcium, Ion 1.14 (L) 1.15 - 1.40 mmol/L   HCT 41.0 36.0 - 46.0 %   Hemoglobin 13.9 12.0 - 15.0 g/dL   Sample type VENOUS    Comment NOTIFIED PHYSICIAN   Lactic acid, plasma     Status: Abnormal   Collection Time: 05/22/22  1:05 PM  Result Value Ref Range   Lactic Acid, Venous 2.2  (HH) 0.5 - 1.9 mmol/L    Comment: CRITICAL VALUE NOTED. VALUE IS CONSISTENT WITH PREVIOUSLY REPORTED/CALLED VALUE Performed at [facility], [address] [place], [place] [address]   Troponin I (High Sensitivity)     Status: Abnormal   Collection Time: 05/22/22  1:06 PM  Result Value Ref Range   Troponin I (High Sensitivity) 19 (H) <18 ng/L    Comment: (NOTE) Elevated high sensitivity troponin I (hsTnI) values and significant  changes across serial measurements may suggest ACS but many other  chronic and acute conditions are known to elevate hsTnI results.  Refer to the "Links" section for chest pain algorithms and additional  guidance. Performed at [facility], [address], [place], [place] [address]    DG Hips Bilat W or Wo Pelvis 3-4 Views  Result Date: 05/22/2022 CLINICAL DATA:  Pain after fall EXAM: DG HIP (WITH OR WITHOUT PELVIS) 4V BILAT COMPARISON:  None Available. FINDINGS: There is no evidence of hip fracture or dislocation. There  is no evidence of arthropathy or other focal bone abnormality. IMPRESSION: No acute osseous abnormality Electronically Signed   By: Karen Kays M.D.   On: 05/22/2022 13:09   CT Head Wo Contrast  Result Date: 05/22/2022 CLINICAL DATA:  Head trauma, minor (Age >= 65y); Neck trauma (Age >= 65y) EXAM: CT HEAD WITHOUT CONTRAST CT CERVICAL SPINE WITHOUT CONTRAST TECHNIQUE: Multidetector CT imaging of the head and cervical spine was performed following the standard protocol without intravenous contrast. Multiplanar CT image reconstructions of the cervical spine were also generated. RADIATION DOSE REDUCTION: This exam was performed according to the departmental dose-optimization program which includes automated exposure control, adjustment of the mA and/or kV according to patient size and/or use of iterative reconstruction technique. COMPARISON:  01/26/2022 FINDINGS: CT HEAD FINDINGS Brain: No evidence of acute infarction, hemorrhage,  hydrocephalus, extra-axial collection or mass lesion/mass effect. Age related cerebral volume loss. Vascular: No hyperdense vessel or unexpected calcification. Skull: Normal. Negative for fracture or focal lesion. Sinuses/Orbits: No acute finding. Other: None. CT CERVICAL SPINE FINDINGS Technical note: Images are degraded by patient motion artifact. Alignment: Facet joints are aligned without dislocation or traumatic listhesis. Dens and lateral masses are aligned. Skull base and vertebrae: No acute fracture. No primary bone lesion or focal pathologic process. Soft tissues and spinal canal: No prevertebral fluid or swelling. No visible canal hematoma. Disc levels: Degenerative disc disease of C5-6. Advanced multilevel bilateral facet arthropathy. Upper chest: Negative. Other: Bilateral carotid atherosclerosis. IMPRESSION: 1. No acute intracranial abnormality. 2. No acute fracture or subluxation of the cervical spine. Electronically Signed   By: Duanne Guess D.O.   On: 05/22/2022 12:54   CT Cervical Spine Wo Contrast  Result Date: 05/22/2022 CLINICAL DATA:  Head trauma, minor (Age >= 65y); Neck trauma (Age >= 65y) EXAM: CT HEAD WITHOUT CONTRAST CT CERVICAL SPINE WITHOUT CONTRAST TECHNIQUE: Multidetector CT imaging of the head and cervical spine was performed following the standard protocol without intravenous contrast. Multiplanar CT image reconstructions of the cervical spine were also generated. RADIATION DOSE REDUCTION: This exam was performed according to the departmental dose-optimization program which includes automated exposure control, adjustment of the mA and/or kV according to patient size and/or use of iterative reconstruction technique. COMPARISON:  01/26/2022 FINDINGS: CT HEAD FINDINGS Brain: No evidence of acute infarction, hemorrhage, hydrocephalus, extra-axial collection or mass lesion/mass effect. Age related cerebral volume loss. Vascular: No hyperdense vessel or unexpected calcification.  Skull: Normal. Negative for fracture or focal lesion. Sinuses/Orbits: No acute finding. Other: None. CT CERVICAL SPINE FINDINGS Technical note: Images are degraded by patient motion artifact. Alignment: Facet joints are aligned without dislocation or traumatic listhesis. Dens and lateral masses are aligned. Skull base and vertebrae: No acute fracture. No primary bone lesion or focal pathologic process. Soft tissues and spinal canal: No prevertebral fluid or swelling. No visible canal hematoma. Disc levels: Degenerative disc disease of C5-6. Advanced multilevel bilateral facet arthropathy. Upper chest: Negative. Other: Bilateral carotid atherosclerosis. IMPRESSION: 1. No acute intracranial abnormality. 2. No acute fracture or subluxation of the cervical spine. Electronically Signed   By: Duanne Guess D.O.   On: 05/22/2022 12:54   DG Chest Port 1 View  Result Date: 05/22/2022 CLINICAL DATA:  Sepsis EXAM: PORTABLE CHEST 1 VIEW COMPARISON:  03/21/2022 FINDINGS: Single lead left-sided implanted cardiac device remains in place. Stable cardiomegaly status post TAVR. Aortic atherosclerosis. Coarsened interstitial markings bilaterally with new patchy right lower lobe airspace opacity. No pleural effusion or pneumothorax. IMPRESSION: New patchy right lower lobe airspace  opacity, suspicious for pneumonia. Electronically Signed   By: Duanne Guess D.O.   On: 05/22/2022 11:55    Pending Labs Unresulted Labs (From admission, onward)     Start     Ordered   05/23/22 0500  Basic metabolic panel  Tomorrow morning,   R        05/22/22 1403   05/23/22 0500  CBC  Tomorrow morning,   R        05/22/22 1403   05/22/22 1405  Hemoglobin A1c  Once,   R       Comments: To assess prior glycemic control    05/22/22 1405   05/22/22 1404  Mycoplasma pneumoniae antibody, IgM  Once,   R        05/22/22 1403   05/22/22 1404  Legionella Pneumophila Serogp 1 Ur Ag  Once,   R        05/22/22 1403   05/22/22 1403  Strep  pneumoniae urinary antigen  Once,   R        05/22/22 1403   05/22/22 1106  Urinalysis, w/ Reflex to Culture (Infection Suspected) -Urine, Clean Catch  (Septic presentation on arrival (screening labs, nursing and treatment orders for obvious sepsis))  Once,   URGENT       Question:  Specimen Source  Answer:  Urine, Clean Catch   05/22/22 1105   05/22/22 1105  Blood Culture (routine x 2)  (Septic presentation on arrival (screening labs, nursing and treatment orders for obvious sepsis))  BLOOD CULTURE X 2,   STAT      05/22/22 1105   05/22/22 1105  Expectorated Sputum Assessment w Gram Stain, Rflx to Resp Cult  (Septic presentation on arrival (screening labs, nursing and treatment orders for obvious sepsis))  ONCE - URGENT,   URGENT        05/22/22 1105            Vitals/Pain Today's Vitals   05/22/22 1056 05/22/22 1058 05/22/22 1100 05/22/22 1103  BP:    (!) 153/103  Pulse:  (!) 58  61  Resp:  (!) 24    Temp:  (!) 100.8 F (38.2 C)    TempSrc:  Oral    SpO2: 94% 100%  96%  Weight:   90.7 kg   Height:   5\' 8"  (1.727 m)     Isolation Precautions No active isolations  Medications Medications  cefTRIAXone (ROCEPHIN) 2 g in sodium chloride 0.9 % 100 mL IVPB (0 g Intravenous Stopped 05/22/22 1143)  azithromycin (ZITHROMAX) 500 mg in sodium chloride 0.9 % 250 mL IVPB (0 mg Intravenous Stopped 05/22/22 1305)  potassium chloride 10 mEq in 100 mL IVPB (10 mEq Intravenous New Bag/Given 05/22/22 1330)  acetaminophen (TYLENOL) tablet 650 mg (has no administration in time range)  lactated ringers bolus 1,000 mL (has no administration in time range)  potassium chloride SA (KLOR-CON M) CR tablet 40 mEq (has no administration in time range)  acetaminophen (TYLENOL) tablet 500-1,000 mg (has no administration in time range)  amLODipine (NORVASC) tablet 10 mg (has no administration in time range)  irbesartan (AVAPRO) tablet 300 mg (has no administration in time range)  meclizine (ANTIVERT) tablet  25 mg (has no administration in time range)  apixaban (ELIQUIS) tablet 5 mg (has no administration in time range)  rOPINIRole (REQUIP) tablet 4 mg (has no administration in time range)  albuterol (VENTOLIN HFA) 108 (90 Base) MCG/ACT inhaler 2 puff (has no administration in time range)  benzonatate (TESSALON) capsule 200 mg (has no administration in time range)  loratadine (CLARITIN) tablet 10 mg (has no administration in time range)  guaiFENesin-dextromethorphan (ROBITUSSIN DM) 100-10 MG/5ML syrup 10 mL (has no administration in time range)  budesonide (PULMICORT) nebulizer solution 2 mg (has no administration in time range)  methylPREDNISolone sodium succinate (SOLU-MEDROL) 40 mg/mL injection 40 mg (has no administration in time range)    Followed by  predniSONE (DELTASONE) tablet 40 mg (has no administration in time range)  ipratropium-albuterol (DUONEB) 0.5-2.5 (3) MG/3ML nebulizer solution 3 mL (has no administration in time range)  insulin aspart (novoLOG) injection 0-20 Units (has no administration in time range)  lactated ringers bolus 1,000 mL (0 mLs Intravenous Stopped 05/22/22 1234)  ipratropium-albuterol (DUONEB) 0.5-2.5 (3) MG/3ML nebulizer solution 3 mL (3 mLs Nebulization Given 05/22/22 1125)  methylPREDNISolone sodium succinate (SOLU-MEDROL) 125 mg/2 mL injection 125 mg (125 mg Intravenous Given 05/22/22 1120)  magnesium sulfate IVPB 2 g 50 mL (0 g Intravenous Stopped 05/22/22 1219)    Mobility Non-ambulatory      Focused Assessments Pulmonary Assessment Handoff:  Lung sounds: Bilateral Breath Sounds: Expiratory wheezes L Breath Sounds: Expiratory wheezes, Inspiratory wheezes R Breath Sounds: Expiratory wheezes, Inspiratory wheezes O2 Device: Nasal Cannula O2 Flow Rate (L/min): 2 L/min    R Recommendations: See Admitting Provider Note  Report given to:   Additional Notes:

## 2022-05-22 NOTE — H&P (Signed)
History and Physical    ELMER BOUTELLE ZOX:[id number] DOB: 25-Aug-1945 DOA: 05/22/2022  PCP: Georgann Housekeeper, MD (Confirm with patient/family/NH records and if not entered, this has to be entered at [facility] point of entry) Patient coming from: Home  I have personally briefly reviewed patient's old medical records in [facility]  Chief Complaint: Cough, wheezing, SOB, fever  HPI: TAYELOR OSBORNE is a 77 y.o. 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  Symptoms started two days ago with patient started to have cough with occasional white phlegm production, wheezing and SOB. Symptoms getting worse yesterday, spiked fever 100.0 last night.  Unable to sleep last night given worsening of SOB and cough. Vomited one time last night but denied any chest pain, abdominal pain. No choking.  This morning, patient was feeling lightheaded and fell 1 time backward and hit his leg.  But denies any LOC.  Denies head injury.  ED Course: Patient was found in low grade fever of 100.34F, tachypneic, easily desaturated, stabilized on 4 L.  Chest x-ray showed right lower lobe infiltrates.  On physical exam patient has diffused wheezing.  Antibiotics were started in the ED.  Patient was given a total of 2 L IV bolus.  Blood work showed lactic acid 2.9, WBC 8.8, K3.2, VBG 7.3 6/37/70.  Review of Systems: As per HPI otherwise 14 point review of systems negative.    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 Clifton James & Dr. Cornelius Moras   Severe aortic stenosis    Type 2 diabetes mellitus without complication, without long-term current use of insulin 04/10/2018   Vitamin D deficiency     Past Surgical History:  Procedure Laterality Date   AV NODE ABLATION N/A 07/14/2021   Procedure: AV NODE ABLATION;  Surgeon: Regan Lemming, MD;  Location: [facility] INVASIVE CV LAB;  Service: Cardiovascular;  Laterality: N/A;   BUNIONECTOMY  20 yrs ago   bil feet   BUNIONECTOMY  11/30/2011   Procedure: BUNIONECTOMY;  Surgeon: Drucilla Schmidt, MD;  Location: [facility] ORS;  Service: Orthopedics;  Laterality: Bilateral;  RIGHT FOOT EXCISION OF BUNIONETTE AND PARTIAL   PROXIMAL PHALANGECTOMY OF 5TH TOE LEFT FOOT FUNK BUNIONECTOMY,EXCISION OF BUNIONETTE    CARDIOVERSION N/A 11/05/2019   Procedure: CARDIOVERSION;  Surgeon: Lewayne Bunting, MD;  Location: [facility] ENDOSCOPY;  Service: Cardiovascular;  Laterality: N/A;   CARDIOVERSION N/A 12/05/2019   Procedure: CARDIOVERSION;  Surgeon: Parke Poisson, MD;  Location: [facility] ENDOSCOPY;  Service: Cardiovascular;  Laterality: N/A;   COLONOSCOPY WITH PROPOFOL N/A 08/07/2012   Procedure: COLONOSCOPY WITH PROPOFOL;  Surgeon: Charolett Bumpers, MD;  Location: [facility]  ENDOSCOPY;  Service: Endoscopy;  Laterality: N/A;   MOUTH SURGERY     teeth extractions   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   Procedure: PACEMAKER IMPLANT;  Surgeon: Regan Lemming, MD;  Location: [facility]VAS[facility] CV LAB;  Service: Cardiovascular;  Laterality: N/A;   RIGHT/LEFT HEART CATH AND CORONARY ANGIOGRAPHY N/A 06/17/2020   Procedure: RIGHT/LEFT HEART CATH AND CORONARY ANGIOGRAPHY;  Surgeon: Lyn Records, MD;  Location: [facility] CV LAB;  Service: Cardiovascular;  Laterality: N/A;    TONSILLECTOMY     TRANSCATHETER AORTIC VALVE REPLACEMENT, TRANSFEMORAL N/A 06/30/2020   Procedure: TRANSCATHETER AORTIC VALVE REPLACEMENT, TRANSFEMORAL;  Surgeon: Kathleene Hazel, MD;  Location: [facility] INVASIVE CV LAB;  Service: Open Heart Surgery;  Laterality: N/A;   TUBAL LIGATION     WRIST SURGERY       reports that she has never smoked. She has never used smokeless tobacco. She reports that she does not drink alcohol and does not use drugs.  Allergies  Allergen Reactions   Bee Venom Shortness Of Breath and Rash   Claritin [Loratadine] Itching and Palpitations   Wasp Venom Other (See Comments) and Anaphylaxis   Apricot Flavor Swelling    Eyes swell shut   Atorvastatin Itching    Eye swelling Other reaction(s): HA    Family History  Problem Relation Age of Onset   Heart disease Mother    Stroke Mother    Cancer Father        Prostate     Prior to Admission medications   Medication Sig Start Date End Date Taking? Authorizing Provider  acetaminophen (TYLENOL) 500 MG tablet Take 500-1,000 mg by mouth every 8 (eight) hours as needed for moderate pain.    [provider]  albuterol (VENTOLIN HFA) 108 (90 Base) MCG/ACT inhaler Inhale 2 puffs into the lungs every 4 (four) hours as needed for wheezing or shortness of breath.    [provider]  amLODipine (NORVASC) 10 MG tablet Take 1 tablet (10 mg total) by mouth daily. 11/03/21   Gaston Islam., NP  apixaban (ELIQUIS) 5 MG TABS tablet Take 1 tablet (5 mg total) by mouth 2 (two) times daily. 06/18/21   Lyn Records, MD  benzonatate (TESSALON) 200 MG capsule Take 200 mg by mouth 3 (three) times daily as needed for cough. 11/12/21   [provider]  cetirizine (ZYRTEC) 10 MG tablet Take 10 mg by mouth daily as needed for allergies.    [provider]  Cyanocobalamin (VITAMIN B-12 PO) Take 1 tablet by mouth daily.    [provider]  diclofenac Sodium (VOLTAREN) 1 % GEL Apply 2 g  topically as needed (for pain).    [provider]  guaiFENesin-dextromethorphan (ROBITUSSIN DM) 100-10 MG/5ML syrup Take 10 mLs by mouth every 4 (four) hours as needed for cough. 01/28/22   Joseph Art, DO  meclizine (ANTIVERT) 25 MG tablet Take 25 mg by mouth 2 (two) times daily as needed (vertigo). 11/12/20   [provider]  Multiple Vitamins-Minerals (MULTIVITAMIN WITH MINERALS) tablet Take 1 tablet by mouth daily.    [provider]  olmesartan (BENICAR) 40 MG tablet Take 1 tablet (40 mg total) by mouth daily. 03/21/22   Gerhard Munch, MD  rOPINIRole (REQUIP) 4 MG tablet Take 4 mg by mouth at bedtime.    [provider]    Physical Exam: Vitals:   05/22/22 1056 05/22/22 1058 05/22/22 1100 05/22/22 1103  BP:    (!) 153/103  Pulse:  (!) 58  61  Resp:  (!) 24    Temp:  (!) 100.8 F (38.2 C)    TempSrc:  Oral    SpO2: 94% 100%  96%  Weight:   90.7 kg   Height:   5\' 8"  (1.727 m)     Constitutional: NAD, calm, comfortable Vitals:   05/22/22 1056 05/22/22 1058 05/22/22 1100 05/22/22 1103  BP:    (!) 153/103  Pulse:  (!) 58  61  Resp:  (!) 24    Temp:  (!) 100.8 F (38.2 C)    TempSrc:  Oral    SpO2: 94% 100%  96%  Weight:   90.7 kg   Height:   5\' 8"  (1.727 m)    Eyes: PERRL, lids and conjunctivae normal ENMT: Mucous membranes are moist. Posterior pharynx clear of any exudate or lesions.Normal dentition.  Neck: normal, supple, no masses, no thyromegaly Respiratory: Diminished breathing sound bilaterally, diffused wheezing, right-sided crackles.  Increasing respiratory effort. No accessory muscle use.  Cardiovascular: Regular rate and rhythm, no murmurs / rubs / gallops. No extremity edema. 2+ pedal pulses. No carotid bruits.  Abdomen: no tenderness, no masses palpated. No hepatosplenomegaly. Bowel sounds positive.  Musculoskeletal: no clubbing / cyanosis. No joint deformity upper and lower extremities. Good ROM, no contractures. Normal  muscle tone.  Skin: no rashes, lesions, ulcers. No induration Neurologic: CN 2-12 grossly intact. Sensation intact, DTR normal. Strength 5/5 in all 4.  Psychiatric: Normal judgment and insight. Alert and oriented x 3. Normal mood.     Labs on Admission: I have personally reviewed following labs and imaging studies  CBC: Recent Labs  Lab 05/22/22 1110 05/22/22 1120  WBC 8.8  --   NEUTROABS 6.5  --   HGB 13.2 13.9  HCT 40.6 41.0  MCV 94.4  --   PLT 156  --    Basic Metabolic Panel: Recent Labs  Lab 05/22/22 1110 05/22/22 1120  NA 136 138  K 3.2* 3.2*  CL 101  --   CO2 20*  --   GLUCOSE 207*  --   BUN 11  --   CREATININE 0.92  --   CALCIUM 9.3  --    GFR: Estimated Creatinine Clearance: 63.2 mL/min (by C-G formula based on SCr of 0.92 mg/dL). Liver Function Tests: Recent Labs  Lab 05/22/22 1110  AST 32  ALT 30  ALKPHOS 72  BILITOT 1.3*  PROT 7.1  ALBUMIN 4.0   No results for input(s): "LIPASE", "AMYLASE" in the last 168 hours. No results for input(s): "AMMONIA" in the last 168 hours. Coagulation Profile: Recent Labs  Lab 05/22/22 1110  INR 1.3*   Cardiac Enzymes: No results for input(s): "CKTOTAL", "CKMB", "CKMBINDEX", "TROPONINI" in the last 168 hours. BNP (last 3 results) No results for input(s): "PROBNP" in the last 8760 hours. HbA1C: No results for input(s): "HGBA1C" in the last 72 hours. CBG: No results for input(s): "GLUCAP" in the last 168 hours. Lipid Profile: No results for input(s): "CHOL", "HDL", "LDLCALC", "TRIG", "CHOLHDL", "LDLDIRECT" in the last 72 hours. Thyroid Function Tests: Recent Labs    05/22/22 1110  TSH 0.512  FREET4 0.86   Anemia Panel: No results for input(s): "VITAMINB12", "FOLATE", "FERRITIN", "TIBC", "IRON", "RETICCTPCT" in the last 72 hours. Urine analysis:    Component Value Date/Time   COLORURINE YELLOW 02/01/2022 0344   APPEARANCEUR Clear 02/16/2022 1316   LABSPEC >1.046 (H) 02/01/2022 0344   PHURINE 5.0  02/01/2022 0344   GLUCOSEU Negative  02/16/2022 1316   HGBUR LARGE (A) 02/01/2022 0344   BILIRUBINUR Negative 02/16/2022 1316   KETONESUR NEGATIVE 02/01/2022 0344   PROTEINUR 1+ (A) 02/16/2022 1316   PROTEINUR 100 (A) 02/01/2022 0344   NITRITE Negative 02/16/2022 1316   NITRITE NEGATIVE 02/01/2022 0344   LEUKOCYTESUR Negative 02/16/2022 1316   LEUKOCYTESUR SMALL (A) 02/01/2022 0344    Radiological Exams on Admission: DG Hips Bilat W or Wo Pelvis 3-4 Views  Result Date: 05/22/2022 CLINICAL DATA:  Pain after fall EXAM: DG HIP (WITH OR WITHOUT PELVIS) 4V BILAT COMPARISON:  None Available. FINDINGS: There is no evidence of hip fracture or dislocation. There is no evidence of arthropathy or other focal bone abnormality. IMPRESSION: No acute osseous abnormality Electronically Signed   By: Karen Kays M.D.   On: 05/22/2022 13:09   CT Head Wo Contrast  Result Date: 05/22/2022 CLINICAL DATA:  Head trauma, minor (Age >= 65y); Neck trauma (Age >= 65y) EXAM: CT HEAD WITHOUT CONTRAST CT CERVICAL SPINE WITHOUT CONTRAST TECHNIQUE: Multidetector CT imaging of the head and cervical spine was performed following the standard protocol without intravenous contrast. Multiplanar CT image reconstructions of the cervical spine were also generated. RADIATION DOSE REDUCTION: This exam was performed according to the departmental dose-optimization program which includes automated exposure control, adjustment of the mA and/or kV according to patient size and/or use of iterative reconstruction technique. COMPARISON:  01/26/2022 FINDINGS: CT HEAD FINDINGS Brain: No evidence of acute infarction, hemorrhage, hydrocephalus, extra-axial collection or mass lesion/mass effect. Age related cerebral volume loss. Vascular: No hyperdense vessel or unexpected calcification. Skull: Normal. Negative for fracture or focal lesion. Sinuses/Orbits: No acute finding. Other: None. CT CERVICAL SPINE FINDINGS Technical note: Images are degraded by  patient motion artifact. Alignment: Facet joints are aligned without dislocation or traumatic listhesis. Dens and lateral masses are aligned. Skull base and vertebrae: No acute fracture. No primary bone lesion or focal pathologic process. Soft tissues and spinal canal: No prevertebral fluid or swelling. No visible canal hematoma. Disc levels: Degenerative disc disease of C5-6. Advanced multilevel bilateral facet arthropathy. Upper chest: Negative. Other: Bilateral carotid atherosclerosis. IMPRESSION: 1. No acute intracranial abnormality. 2. No acute fracture or subluxation of the cervical spine. Electronically Signed   By: Duanne Guess D.O.   On: 05/22/2022 12:54   CT Cervical Spine Wo Contrast  Result Date: 05/22/2022 CLINICAL DATA:  Head trauma, minor (Age >= 65y); Neck trauma (Age >= 65y) EXAM: CT HEAD WITHOUT CONTRAST CT CERVICAL SPINE WITHOUT CONTRAST TECHNIQUE: Multidetector CT imaging of the head and cervical spine was performed following the standard protocol without intravenous contrast. Multiplanar CT image reconstructions of the cervical spine were also generated. RADIATION DOSE REDUCTION: This exam was performed according to the departmental dose-optimization program which includes automated exposure control, adjustment of the mA and/or kV according to patient size and/or use of iterative reconstruction technique. COMPARISON:  01/26/2022 FINDINGS: CT HEAD FINDINGS Brain: No evidence of acute infarction, hemorrhage, hydrocephalus, extra-axial collection or mass lesion/mass effect. Age related cerebral volume loss. Vascular: No hyperdense vessel or unexpected calcification. Skull: Normal. Negative for fracture or focal lesion. Sinuses/Orbits: No acute finding. Other: None. CT CERVICAL SPINE FINDINGS Technical note: Images are degraded by patient motion artifact. Alignment: Facet joints are aligned without dislocation or traumatic listhesis. Dens and lateral masses are aligned. Skull base and  vertebrae: No acute fracture. No primary bone lesion or focal pathologic process. Soft tissues and spinal canal: No prevertebral fluid or swelling. No visible canal hematoma. Disc levels: Degenerative  disc disease of C5-6. Advanced multilevel bilateral facet arthropathy. Upper chest: Negative. Other: Bilateral carotid atherosclerosis. IMPRESSION: 1. No acute intracranial abnormality. 2. No acute fracture or subluxation of the cervical spine. Electronically Signed   By: Duanne Guess D.O.   On: 05/22/2022 12:54   DG Chest Port 1 View  Result Date: 05/22/2022 CLINICAL DATA:  Sepsis EXAM: PORTABLE CHEST 1 VIEW COMPARISON:  03/21/2022 FINDINGS: Single lead left-sided implanted cardiac device remains in place. Stable cardiomegaly status post TAVR. Aortic atherosclerosis. Coarsened interstitial markings bilaterally with new patchy right lower lobe airspace opacity. No pleural effusion or pneumothorax. IMPRESSION: New patchy right lower lobe airspace opacity, suspicious for pneumonia. Electronically Signed   By: Duanne Guess D.O.   On: 05/22/2022 11:55    EKG: Independently reviewed. V paced  Assessment/Plan Principal Problem:   Pneumonia Active Problems:   Chronic diastolic CHF (congestive heart failure)   CAP (community acquired pneumonia)   Asthma, chronic obstructive, with acute exacerbation  (please populate well all problems here in Problem List. (For example, if patient is on BP meds at home and you resume or decide to hold them, it is a problem that needs to be her. Same for CAD, COPD, HLD and so on)  Sepsis -Secondary to CAP of RLL -Sepsis evidenced by new onset of acute hypoxia, elevated lactate level, source of infection is right lower lobe pneumonia -Received total of 2 L IV bolus, recheck lactic acid Will continue current antibiotic regimen of ceftriaxone and azithromycin -Blood culture x2 sent in ED  Acute asthma exacerbation -Probably triggered by pneumonia, -Continue DuoNeb  every 6 hours, as needed albuterol -Continue ICS, LABA -Incentive spirometry and flutter valve  Acute hypoxic respiratory failure -As above  Near syncope and fall -Likely related to sepsis, hypoxia -Appears to be more euvolemic now after IV bolus -Check orthostatic vitals and PT evaluation in AM  Hypokalemia -IV and PO replacement, recheck level in AM  HTN, uncontrolled -Resume home BP meds  IIDM with hyperglycemia -SSI  PAF -Rate controlled -Continue Eliquis  Hx of AS with TAVR, chronic HFpEF, PPM -No acute issue  DVT prophylaxis: Eliquis Code Status: Full code Family Communication: None at bedside Disposition Plan: Patient is sick with sepsis, requiring IV ABX and expect more than 2 midnight hospital stay Consults called: None Admission status: Tele admit   Emeline General MD [facility] Pager [phone]  05/22/2022, 2:04 PM

## 2022-05-23 ENCOUNTER — Ambulatory Visit: Payer: Medicare Other

## 2022-05-23 DIAGNOSIS — J441 Chronic obstructive pulmonary disease with (acute) exacerbation: Secondary | ICD-10-CM | POA: Diagnosis not present

## 2022-05-23 DIAGNOSIS — J45901 Unspecified asthma with (acute) exacerbation: Secondary | ICD-10-CM

## 2022-05-23 LAB — CBC
HCT: 36.8 % (ref 36.0–46.0)
Hemoglobin: 12.4 g/dL (ref 12.0–15.0)
MCH: 31.3 pg (ref 26.0–34.0)
MCHC: 33.7 g/dL (ref 30.0–36.0)
MCV: 92.9 fL (ref 80.0–100.0)
Platelets: 139 10*3/uL — ABNORMAL LOW (ref 150–400)
RBC: 3.96 MIL/uL (ref 3.87–5.11)
RDW: 13.2 % (ref 11.5–15.5)
WBC: 6.3 10*3/uL (ref 4.0–10.5)
nRBC: 0 % (ref 0.0–0.2)

## 2022-05-23 LAB — BASIC METABOLIC PANEL
Anion gap: 12 (ref 5–15)
BUN: 17 mg/dL (ref 8–23)
CO2: 20 mmol/L — ABNORMAL LOW (ref 22–32)
Calcium: 9.3 mg/dL (ref 8.9–10.3)
Chloride: 102 mmol/L (ref 98–111)
Creatinine, Ser: 0.8 mg/dL (ref 0.44–1.00)
GFR, Estimated: >60 mL/min (ref >60)
Glucose, Bld: 286 mg/dL — ABNORMAL HIGH (ref 70–99)
Potassium: 3.8 mmol/L (ref 3.5–5.1)
Sodium: 134 mmol/L — ABNORMAL LOW (ref 135–145)

## 2022-05-23 LAB — CBG MONITORING, ED: Glucose-Capillary: 295 mg/dL — ABNORMAL HIGH (ref 70–99)

## 2022-05-23 LAB — GLUCOSE, CAPILLARY: Glucose-Capillary: 304 mg/dL — ABNORMAL HIGH (ref 70–99)

## 2022-05-23 LAB — HEMOGLOBIN A1C
Hgb A1c MFr Bld: 8.1 % — ABNORMAL HIGH (ref 4.8–5.6)
Mean Plasma Glucose: 185.77 mg/dL

## 2022-05-23 MED ORDER — DOXYCYCLINE HYCLATE 100 MG PO TABS
100.0000 mg | ORAL_TABLET | Freq: Two times a day (BID) | ORAL | Status: DC
  Administered 2022-05-23 – 2022-05-27 (×9): 100 mg via ORAL
  Filled 2022-05-23 (×9): qty 1

## 2022-05-23 MED ORDER — ROPINIROLE HCL 1 MG PO TABS
4.0000 mg | ORAL_TABLET | Freq: Every day | ORAL | Status: DC
  Administered 2022-05-23 – 2022-05-29 (×7): 4 mg via ORAL
  Filled 2022-05-23 (×10): qty 4

## 2022-05-23 NOTE — ED Notes (Signed)
Admitting MD at BS.  

## 2022-05-23 NOTE — ED Notes (Signed)
Pt asleep no c/o pain or discomfort.

## 2022-05-23 NOTE — ED Notes (Signed)
Orthostatic vital signs not done pt unable to tolerate

## 2022-05-23 NOTE — Evaluation (Signed)
Physical Therapy Evaluation Patient Details Name: Briana Foster MRN: [id number] DOB: [birth year] Today's Date: 05/23/2022  History of Present Illness  77 y.o. female presents to [facility] hospital on 05/22/2022 with cough, wheezing, SOB, fever. Pt found to have RLL CAP. Pt also with a fall morning of presentation, reports feeling lightheaded prior to fall. PMH includes OA, CAD, depression, HTN, HLD, PAF, RA, DMII.  Clinical Impression  Pt presents to PT with deficits in activity tolerance, gait, balance. Pt reports feeling lightheaded at rest and with mobilization at this time. She reports having these symptoms often in the morning at home prior to taking her morning meds. She reports these symptoms as being different from her other vertigo-like symptoms. Of note pt is hypertensive throughout session, orthostatics negative for significant change. Pt is able to ambulate for short household distances at this time with support of RW. Pt will benefit from frequent mobilization in an effort to improve activity tolerance and balance. Pt may benefit from outpatient vestibular PT as she reports a history of chronic vertigo symptoms and instability.       Recommendations for follow up therapy are one component of a multi-disciplinary discharge planning process, led by the attending physician.  Recommendations may be updated based on patient status, additional functional criteria and insurance authorization.  Follow Up Recommendations       Assistance Recommended at Discharge Intermittent Supervision/Assistance  Patient can return home with the following  A little help with walking and/or transfers;A little help with bathing/dressing/bathroom;Assistance with cooking/housework;Assist for transportation;Help with stairs or ramp for entrance    Equipment Recommendations None recommended by PT  Recommendations for Other Services       Functional Status Assessment Patient has had a recent decline in their  functional status and demonstrates the ability to make significant improvements in function in a reasonable and predictable amount of time.     Precautions / Restrictions Precautions Precautions: Fall Precaution Comments: history of vertigo, takes meclizine often at home Restrictions Weight Bearing Restrictions: No      Mobility  Bed Mobility Overal bed mobility: Modified Independent                  Transfers Overall transfer level: Needs assistance Equipment used: Rolling walker (2 wheels), None Transfers: Sit to/from Stand, Bed to chair/wheelchair/BSC Sit to Stand: Supervision   Step pivot transfers: Supervision       General transfer comment: pt performs step pivot to [facility] without DME, later stands with RW    Ambulation/Gait Ambulation/Gait assistance: Supervision Gait Distance (Feet): 80 Feet Assistive device: Rolling walker (2 wheels) Gait Pattern/deviations: Step-through pattern Gait velocity: reduced Gait velocity interpretation: <1.8 ft/sec, indicate of risk for recurrent falls   General Gait Details: slowed step-through gait  Stairs            Wheelchair Mobility    Modified Rankin (Stroke Patients Only)       Balance Overall balance assessment: Needs assistance Sitting-balance support: No upper extremity supported, Feet supported Sitting balance-Leahy Scale: Good     Standing balance support: Single extremity supported, Reliant on assistive device for balance Standing balance-Leahy Scale: Poor                               Pertinent Vitals/Pain Pain Assessment Pain Assessment: No/denies pain    Home Living Family/patient expects to be discharged to:: Private residence Living Arrangements: Children Available Help at Discharge: Family;Available 24 hours/day  Type of Home: House Home Access: Stairs to enter Entrance Stairs-Rails: Can reach both Entrance Stairs-Number of Steps: 3   Home Layout: One level Home  Equipment: Cane - single point;Rollator (4 wheels);Shower seat - built in;BSC/3in1      Prior Function Prior Level of Function : Independent/Modified Independent             Mobility Comments: using cane or rollator as needed ADLs Comments: independent     Hand Dominance   Dominant Hand: Right    Extremity/Trunk Assessment   Upper Extremity Assessment Upper Extremity Assessment: Overall WFL for tasks assessed    Lower Extremity Assessment Lower Extremity Assessment: Overall WFL for tasks assessed    Cervical / Trunk Assessment Cervical / Trunk Assessment: Normal  Communication   Communication: No difficulties  Cognition Arousal/Alertness: Awake/alert Behavior During Therapy: WFL for tasks assessed/performed Overall Cognitive Status: Within Functional Limits for tasks assessed                                          General Comments General comments (skin integrity, edema, etc.): VSS, pt on 3L [place] during session, sats in 90s. Orthostatic vitals documented in flowsheet, appear negative. Pt does report feeling lightheaded at rest and with mobility, is hypertensive throughout session. Pt reports she often feels lightheaded in the mornings until she takes her medications.    Exercises     Assessment/Plan    PT Assessment Patient needs continued PT services  PT Problem List Decreased strength;Decreased activity tolerance;Decreased balance;Decreased mobility;Decreased knowledge of use of DME;Decreased knowledge of precautions       PT Treatment Interventions DME instruction;Gait training;Stair training;Functional mobility training;Therapeutic activities;Therapeutic exercise;Balance training;Neuromuscular re-education;Patient/family education    PT Goals (Current goals can be found in the Care Plan section)  Acute Rehab PT Goals Patient Stated Goal: to return to independence, stop feeling lightheaded PT Goal Formulation: With patient Time For Goal  Achievement: 06/06/22 Potential to Achieve Goals: Fair Additional Goals Additional Goal #1: Pt will score >45/56 on the BERG balance test to indicate a reduced risk for falls    Frequency Min 3X/week     Co-evaluation               AM-PAC PT "6 Clicks" Mobility  Outcome Measure Help needed turning from your back to your side while in a flat bed without using bedrails?: A Little Help needed moving from lying on your back to sitting on the side of a flat bed without using bedrails?: A Little Help needed moving to and from a bed to a chair (including a wheelchair)?: A Little Help needed standing up from a chair using your arms (e.g., wheelchair or bedside chair)?: A Little Help needed to walk in hospital room?: A Little Help needed climbing 3-5 steps with a railing? : A Little 6 Click Score: 18    End of Session   Activity Tolerance: Patient tolerated treatment well Patient left: in bed;with call bell/phone within reach Nurse Communication: Mobility status PT Visit Diagnosis: Other abnormalities of gait and mobility (R26.89);Other symptoms and signs involving the nervous system (R29.898)    Time: 4098-1191 PT Time Calculation (min) (ACUTE ONLY): 16 min   Charges:   PT Evaluation $PT Eval Low Complexity: 1 Low          Arlyss Gandy, PT, DPT Acute Rehabilitation Office [phone]   Arlyss Gandy 05/23/2022,  10:56 AM

## 2022-05-23 NOTE — Progress Notes (Signed)
  Transition of Care [facility]) Screening Note   Patient Details  Name: Briana Foster Date of Birth: [birth year]   Transition of Care [facility]) CM/SW Contact:    Mearl Latin, LCSW Phone Number: 05/23/2022, 5:07 PM    Transition of Care Department [facility]) has reviewed patient and acknowledge outpatient PT recommendation. We will continue to monitor patient advancement through interdisciplinary progression rounds. If new patient transition needs arise, please place a TOC consult.

## 2022-05-23 NOTE — Progress Notes (Signed)
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  Granddaughter was sick at home who is already better.  In the emergency room temperature 100.8, tachypneic, on 4 L oxygen.  Chest x-ray with right lower lobe infiltrate.  Admitted with asthma exacerbation secondary to right lower lobe pneumonia.   Assessment & Plan:   Acute asthma exacerbation in a patient with moderate persistent asthma, right lower lobe pneumonia: Agree with admission to monitored unit because of severity of symptoms. Aggressive bronchodilator therapy, IV steroids-oral steroids, inhalational steroids, scheduled and as needed bronchodilators, deep breathing exercises, incentive spirometry, chest physiotherapy. Antibiotics due to severity of symptoms.  Patient on Rocephin and azithromycin, cultures pending.  Her QTc is 537 and patient is relatively stable.  Will change to oral doxycycline for 7 days. Supplemental oxygen to keep saturations more than 92%. Mobilize with PT OT. Legionella, streptococcal antigen urine.  Hypokalemia: Replaced.  Essential hypertension: Blood pressure stable after resuming home medications.  Type 2 diabetes with hyperglycemia: On sliding scale insulin.  Paroxysmal A-fib aortic stenosis with TAVR, chronic diastolic heart failure, status post PPM: Stable.  Heart rate is well-controlled.  Therapeutic on Eliquis.   DVT prophylaxis:  apixaban (ELIQUIS) tablet 5 mg   Code Status: Full code Family Communication: None at the bedside Disposition Plan: Status is: Inpatient Remains inpatient appropriate because: Significant  wheezing and shortness of breath     Consultants:  None  Procedures:  None  Antimicrobials:  Rocephin azithromycin 4/21-4/22 Doxycycline 4/22--   Subjective: Patient seen and examined.  She was still in the emergency room on my exam.  Still has cough, unable to expectorate his sputum.  Remains afebrile overnight.  Objective: Vitals:   05/23/22 0915 05/23/22 0930 05/23/22 0945 05/23/22 1038  BP: (!) 151/81 (!) 163/90 (!) 163/79 (!) 171/73  Pulse: 62 62 63 62  Resp: 18 (!) 23 (!) 22 13  Temp:    (!) 97.3 F (36.3 C)  TempSrc:      SpO2: 98% 98% 98% 93%  Weight:      Height:        Intake/Output Summary (Last 24 hours) at 05/23/2022 1157 Last data filed at 05/22/2022 1644 Gross per 24 hour  Intake 1100 ml  Output --  Net 1100 ml   Filed Weights   05/22/22 1100  Weight: 90.7 kg    Examination:  General: Looks fairly comfortable.  In minimal distress on mobility. Cardiovascular: S1-S2 normal.  Regular rate rhythm.  Pacemaker present. Respiratory: Bilateral inspiratory and expiratory wheezes.  Poor air entry at bases. SpO2: 93 % O2 Flow Rate (L/min): 4 L/min  Gastrointestinal: Soft.  Nontender.  Bowel sound present. Ext: No swelling or edema.  No cyanosis. Neuro: Alert and awake.     Data Reviewed: I have personally reviewed following labs and imaging studies  CBC: Recent Labs  Lab 05/22/22 1110 05/22/22 1120 05/23/22 0424  WBC 8.8  --  6.3  NEUTROABS 6.5  --   --   HGB 13.2 13.9 12.4  HCT 40.6 41.0 36.8  MCV 94.4  --  92.9  PLT 156  --  139*   Basic Metabolic Panel: Recent Labs  Lab  05/22/22 1110 05/22/22 1120 05/23/22 0424  NA 136 138 134*  K 3.2* 3.2* 3.8  CL 101  --  102  CO2 20*  --  20*  GLUCOSE 207*  --  286*  BUN 11  --  17  CREATININE 0.92  --  0.80  CALCIUM 9.3  --  9.3   GFR: Estimated Creatinine Clearance: 72.7 mL/min (by C-G formula based on SCr of 0.8 mg/dL). Liver Function Tests: Recent Labs  Lab 05/22/22 1110  AST  32  ALT 30  ALKPHOS 72  BILITOT 1.3*  PROT 7.1  ALBUMIN 4.0   No results for input(s): "LIPASE", "AMYLASE" in the last 168 hours. No results for input(s): "AMMONIA" in the last 168 hours. Coagulation Profile: Recent Labs  Lab 05/22/22 1110  INR 1.3*   Cardiac Enzymes: No results for input(s): "CKTOTAL", "CKMB", "CKMBINDEX", "TROPONINI" in the last 168 hours. BNP (last 3 results) No results for input(s): "PROBNP" in the last 8760 hours. HbA1C: Recent Labs    05/23/22 1104  HGBA1C 8.1*   CBG: Recent Labs  Lab 05/22/22 1638 05/22/22 1740 05/22/22 2347 05/22/22 2349 05/23/22 0927  GLUCAP 289* 259* 391* 381* 295*   Lipid Profile: No results for input(s): "CHOL", "HDL", "LDLCALC", "TRIG", "CHOLHDL", "LDLDIRECT" in the last 72 hours. Thyroid Function Tests: Recent Labs    05/22/22 1110  TSH 0.512  FREET4 0.86   Anemia Panel: No results for input(s): "VITAMINB12", "FOLATE", "FERRITIN", "TIBC", "IRON", "RETICCTPCT" in the last 72 hours. Sepsis Labs: Recent Labs  Lab 05/22/22 1110 05/22/22 1305  LATICACIDVEN 2.9* 2.2*    Recent Results (from the past 240 hour(s))  Resp panel by RT-PCR (RSV, Flu A&B, Covid) Anterior Nasal Swab     Status: None   Collection Time: 05/22/22 11:05 AM   Specimen: Anterior Nasal Swab  Result Value Ref Range Status   SARS Coronavirus 2 by RT PCR NEGATIVE NEGATIVE Final   Influenza A by PCR NEGATIVE NEGATIVE Final   Influenza B by PCR NEGATIVE NEGATIVE Final    Comment: (NOTE) The Xpert Xpress SARS-CoV-2/FLU/RSV plus assay is intended as an aid in the diagnosis of influenza from Nasopharyngeal swab specimens and should not be used as a sole basis for treatment. Nasal washings and aspirates are unacceptable for Xpert Xpress SARS-CoV-2/FLU/RSV testing.  Fact Sheet for Patients: BloggerCourse.com  Fact Sheet for Healthcare Providers: SeriousBroker.it  This test is not yet approved  or cleared by the [place] FDA and has been authorized for detection and/or diagnosis of SARS-CoV-2 by FDA under an Emergency Use Authorization (EUA). This EUA will remain in effect (meaning this test can be used) for the duration of the COVID-19 declaration under Section 564(b)(1) of the Act, 21 U.S.C. section 360bbb-3(b)(1), unless the authorization is terminated or revoked.     Resp Syncytial Virus by PCR NEGATIVE NEGATIVE Final    Comment: (NOTE) Fact Sheet for Patients: BloggerCourse.com  Fact Sheet for Healthcare Providers: SeriousBroker.it  This test is not yet approved or cleared by the [place] FDA and has been authorized for detection and/or diagnosis of SARS-CoV-2 by FDA under an Emergency Use Authorization (EUA). This EUA will remain in effect (meaning this test can be used) for the duration of the COVID-19 declaration under Section 564(b)(1) of the Act, 21 U.S.C. section 360bbb-3(b)(1), unless the authorization is terminated or revoked.  Performed at [facility], [address] [place], [place] [address]   Blood Culture (routine x 2)     Status: None (  Preliminary result)   Collection Time: 05/22/22 11:10 AM   Specimen: BLOOD  Result Value Ref Range Status   Specimen Description BLOOD RIGHT ANTECUBITAL  Final   Special Requests   Final    BOTTLES DRAWN AEROBIC AND ANAEROBIC Blood Culture results may not be optimal due to an excessive volume of blood received in culture bottles   Culture   Final    NO GROWTH < 24 HOURS Performed at [facility], [address], [place], [place] [address]    Report Status PENDING  Incomplete  Blood Culture (routine x 2)     Status: None (Preliminary result)   Collection Time: 05/22/22 11:36 AM   Specimen: BLOOD RIGHT FOREARM  Result Value Ref Range Status   Specimen Description BLOOD RIGHT FOREARM  Final   Special Requests   Final    BOTTLES DRAWN AEROBIC  AND ANAEROBIC Blood Culture adequate volume   Culture   Final    NO GROWTH < 24 HOURS Performed at [facility], [address] [place], [place] [address]    Report Status PENDING  Incomplete         Radiology Studies: DG Hips Bilat W or Wo Pelvis 3-4 Views  Result Date: 05/22/2022 CLINICAL DATA:  Pain after fall EXAM: DG HIP (WITH OR WITHOUT PELVIS) 4V BILAT COMPARISON:  None Available. FINDINGS: There is no evidence of hip fracture or dislocation. There is no evidence of arthropathy or other focal bone abnormality. IMPRESSION: No acute osseous abnormality Electronically Signed   By: Karen Kays M.D.   On: 05/22/2022 13:09   CT Head Wo Contrast  Result Date: 05/22/2022 CLINICAL DATA:  Head trauma, minor (Age >= 65y); Neck trauma (Age >= 65y) EXAM: CT HEAD WITHOUT CONTRAST CT CERVICAL SPINE WITHOUT CONTRAST TECHNIQUE: Multidetector CT imaging of the head and cervical spine was performed following the standard protocol without intravenous contrast. Multiplanar CT image reconstructions of the cervical spine were also generated. RADIATION DOSE REDUCTION: This exam was performed according to the departmental dose-optimization program which includes automated exposure control, adjustment of the mA and/or kV according to patient size and/or use of iterative reconstruction technique. COMPARISON:  01/26/2022 FINDINGS: CT HEAD FINDINGS Brain: No evidence of acute infarction, hemorrhage, hydrocephalus, extra-axial collection or mass lesion/mass effect. Age related cerebral volume loss. Vascular: No hyperdense vessel or unexpected calcification. Skull: Normal. Negative for fracture or focal lesion. Sinuses/Orbits: No acute finding. Other: None. CT CERVICAL SPINE FINDINGS Technical note: Images are degraded by patient motion artifact. Alignment: Facet joints are aligned without dislocation or traumatic listhesis. Dens and lateral masses are aligned. Skull base and vertebrae: No acute fracture. No  primary bone lesion or focal pathologic process. Soft tissues and spinal canal: No prevertebral fluid or swelling. No visible canal hematoma. Disc levels: Degenerative disc disease of C5-6. Advanced multilevel bilateral facet arthropathy. Upper chest: Negative. Other: Bilateral carotid atherosclerosis. IMPRESSION: 1. No acute intracranial abnormality. 2. No acute fracture or subluxation of the cervical spine. Electronically Signed   By: Duanne Guess D.O.   On: 05/22/2022 12:54   CT Cervical Spine Wo Contrast  Result Date: 05/22/2022 CLINICAL DATA:  Head trauma, minor (Age >= 65y); Neck trauma (Age >= 65y) EXAM: CT HEAD WITHOUT CONTRAST CT CERVICAL SPINE WITHOUT CONTRAST TECHNIQUE: Multidetector CT imaging of the head and cervical spine was performed following the standard protocol without intravenous contrast. Multiplanar CT image reconstructions of the cervical spine were also generated. RADIATION DOSE REDUCTION: This exam was performed  according to the departmental dose-optimization program which includes automated exposure control, adjustment of the mA and/or kV according to patient size and/or use of iterative reconstruction technique. COMPARISON:  01/26/2022 FINDINGS: CT HEAD FINDINGS Brain: No evidence of acute infarction, hemorrhage, hydrocephalus, extra-axial collection or mass lesion/mass effect. Age related cerebral volume loss. Vascular: No hyperdense vessel or unexpected calcification. Skull: Normal. Negative for fracture or focal lesion. Sinuses/Orbits: No acute finding. Other: None. CT CERVICAL SPINE FINDINGS Technical note: Images are degraded by patient motion artifact. Alignment: Facet joints are aligned without dislocation or traumatic listhesis. Dens and lateral masses are aligned. Skull base and vertebrae: No acute fracture. No primary bone lesion or focal pathologic process. Soft tissues and spinal canal: No prevertebral fluid or swelling. No visible canal hematoma. Disc levels:  Degenerative disc disease of C5-6. Advanced multilevel bilateral facet arthropathy. Upper chest: Negative. Other: Bilateral carotid atherosclerosis. IMPRESSION: 1. No acute intracranial abnormality. 2. No acute fracture or subluxation of the cervical spine. Electronically Signed   By: Duanne Guess D.O.   On: 05/22/2022 12:54   DG Chest Port 1 View  Result Date: 05/22/2022 CLINICAL DATA:  Sepsis EXAM: PORTABLE CHEST 1 VIEW COMPARISON:  03/21/2022 FINDINGS: Single lead left-sided implanted cardiac device remains in place. Stable cardiomegaly status post TAVR. Aortic atherosclerosis. Coarsened interstitial markings bilaterally with new patchy right lower lobe airspace opacity. No pleural effusion or pneumothorax. IMPRESSION: New patchy right lower lobe airspace opacity, suspicious for pneumonia. Electronically Signed   By: Duanne Guess D.O.   On: 05/22/2022 11:55        Scheduled Meds:  amLODipine  10 mg Oral Daily   apixaban  5 mg Oral BID   budesonide (PULMICORT) nebulizer solution  2 mg Nebulization Q12H   doxycycline  100 mg Oral Q12H   insulin aspart  0-20 Units Subcutaneous TID WC   ipratropium-albuterol  3 mL Nebulization Q6H   irbesartan  300 mg Oral Daily   loratadine  10 mg Oral Daily   [START ON 05/24/2022] predniSONE  40 mg Oral Q breakfast   rOPINIRole  4 mg Oral QHS   Continuous Infusions:   LOS: 1 day    Time spent: 35 minutes    Dorcas Carrow, MD [facility] Pager [phone]

## 2022-05-24 DIAGNOSIS — J189 Pneumonia, unspecified organism: Secondary | ICD-10-CM | POA: Diagnosis not present

## 2022-05-24 LAB — GLUCOSE, CAPILLARY
Glucose-Capillary: 168 mg/dL — ABNORMAL HIGH (ref 70–99)
Glucose-Capillary: 202 mg/dL — ABNORMAL HIGH (ref 70–99)
Glucose-Capillary: 183 mg/dL — ABNORMAL HIGH (ref 70–99)
Glucose-Capillary: 229 mg/dL — ABNORMAL HIGH (ref 70–99)

## 2022-05-24 LAB — RESPIRATORY PANEL BY PCR

## 2022-05-24 LAB — MYCOPLASMA PNEUMONIAE ANTIBODY, IGM: Mycoplasma pneumo IgM: <770 U/mL (ref 0–769)

## 2022-05-24 LAB — VITAMIN B12: Vitamin B-12: 553 pg/mL (ref 180–914)

## 2022-05-24 LAB — FOLATE: Folate: 21 ng/mL (ref >5.9)

## 2022-05-24 MED ORDER — SODIUM CHLORIDE 0.9 % IV SOLN
2.0000 g | INTRAVENOUS | Status: DC
  Administered 2022-05-24: 2 g via INTRAVENOUS
  Filled 2022-05-24: qty 20

## 2022-05-24 NOTE — Progress Notes (Signed)
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  Granddaughter was sick at home who is already better.  In the emergency room temperature 100.8, tachypneic, on 4 L oxygen.  Chest x-ray with right lower lobe infiltrate.  Admitted with asthma exacerbation secondary to right lower lobe pneumonia.   Assessment & Plan:   Acute asthma exacerbation in a patient with moderate persistent asthma, right lower lobe pneumonia: -Patient with significant wheezing on presentation, and oxygen requirement, treated initially with IV steroids, currently on prednisone, wheezing much improved today. -Right lower lobe pneumonia, she does report small for episode of vomiting over the weekend, most likely some aspiration event contributing to her pneumonia, initially on IV azithromycin and Rocephin, this has been changed to doxycycline and Rocephin given prolonged QTc. -She was encouraged to use incentive spirometry and flutter valve today -Supplemental oxygen to keep saturations more than 92%. -Mobilize with PT OT. -Legionella, streptococcal antigen urine.  Hypokalemia: Replaced.  Essential hypertension: Blood pressure stable after resuming home medications.  Type 2 diabetes with hyperglycemia: On sliding scale insulin.  Paroxysmal A-fib aortic stenosis with TAVR, chronic diastolic heart failure, status post PPM: Stable.  Heart rate is well-controlled.  Therapeutic on Eliquis.   DVT prophylaxis:  apixaban (ELIQUIS) tablet 5 mg   Code Status: Full code Family Communication: None at the bedside, I have tried  to reach both daughters  and granddaughter by phone, with no answer.  Disposition Plan: Status is: Inpatient Remains inpatient appropriate because: Significant wheezing and shortness of breath     Consultants:  None  Procedures:  None  Antimicrobials:  Rocephin azithromycin 4/21-4/22 Doxycycline 4/22--   Subjective: Patient reports she is feeling better today, but still reports dyspnea, cough, phlegm and generalized weakness  Objective: Vitals:   05/24/22 0034 05/24/22 0126 05/24/22 0300 05/24/22 0836  BP: (!) 160/72  115/86   Pulse: 63 63 62   Resp: 16 17 (!) 24   Temp: 97.9 F (36.6 C)  97.9 F (36.6 C)   TempSrc: Oral  Oral   SpO2: 93% 96% 92% 98%  Weight:      Height:        Intake/Output Summary (Last 24 hours) at 05/24/2022 1126 Last data filed at 05/24/2022 0500 Gross per 24 hour  Intake 480 ml  Output --  Net 480 ml   Filed Weights   05/22/22 1100  Weight: 90.7 kg    Examination:  Awake Alert, Oriented X 3, frail, deconditioned. Symmetrical Chest wall movement, manage air entry at the bases, wheezing much improved. RRR,No Gallops,Rubs or new Murmurs, No Parasternal Heave +ve B.Sounds, Abd Soft, No tenderness, No rebound - guarding or rigidity. No Cyanosis, Clubbing or edema, No new Rash or bruise       Data Reviewed: I have personally reviewed following labs and imaging studies  CBC: Recent Labs  Lab 05/22/22 1110 05/22/22 1120 05/23/22 0424  WBC 8.8  --  6.3  NEUTROABS 6.5  --   --   HGB 13.2 13.9 12.4  HCT 40.6 41.0 36.8  MCV 94.4  --  92.9  PLT 156  --  139*   Basic Metabolic Panel: Recent Labs  Lab 05/22/22 1110 05/22/22 1120 05/23/22 0424  NA 136 138 134*  K 3.2* 3.2* 3.8  CL 101  --  102  CO2 20*  --  20*  GLUCOSE 207*  --  286*  BUN 11  --  17  CREATININE 0.92  --  0.80  CALCIUM 9.3  --  9.3   GFR: Estimated Creatinine Clearance: 72.7 mL/min (by C-G formula based on SCr of 0.8 mg/dL). Liver Function Tests: Recent Labs  Lab 05/22/22 1110   AST 32  ALT 30  ALKPHOS 72  BILITOT 1.3*  PROT 7.1  ALBUMIN 4.0   No results for input(s): "LIPASE", "AMYLASE" in the last 168 hours. No results for input(s): "AMMONIA" in the last 168 hours. Coagulation Profile: Recent Labs  Lab 05/22/22 1110  INR 1.3*   Cardiac Enzymes: No results for input(s): "CKTOTAL", "CKMB", "CKMBINDEX", "TROPONINI" in the last 168 hours. BNP (last 3 results) No results for input(s): "PROBNP" in the last 8760 hours. HbA1C: Recent Labs    05/23/22 1104  HGBA1C 8.1*   CBG: Recent Labs  Lab 05/22/22 2347 05/22/22 2349 05/23/22 0927 05/23/22 2243 05/24/22 0921  GLUCAP 391* 381* 295* 304* 168*   Lipid Profile: No results for input(s): "CHOL", "HDL", "LDLCALC", "TRIG", "CHOLHDL", "LDLDIRECT" in the last 72 hours. Thyroid Function Tests: Recent Labs    05/22/22 1110  TSH 0.512  FREET4 0.86   Anemia Panel: No results for input(s): "VITAMINB12", "FOLATE", "FERRITIN", "TIBC", "IRON", "RETICCTPCT" in the last 72 hours. Sepsis Labs: Recent Labs  Lab 05/22/22 1110 05/22/22 1305  LATICACIDVEN 2.9* 2.2*    Recent Results (from the past 240 hour(s))  Resp panel by RT-PCR (RSV, Flu A&B, Covid) Anterior Nasal Swab     Status: None   Collection Time: 05/22/22 11:05 AM   Specimen: Anterior Nasal Swab  Result Value Ref Range Status   SARS Coronavirus 2 by RT PCR NEGATIVE NEGATIVE Final   Influenza A by PCR NEGATIVE NEGATIVE Final   Influenza B by PCR NEGATIVE NEGATIVE Final    Comment: (NOTE) The Xpert Xpress SARS-CoV-2/FLU/RSV plus assay is intended as an aid in the diagnosis of influenza from Nasopharyngeal swab specimens and should not be used as a sole basis for treatment. Nasal washings and aspirates are unacceptable for Xpert Xpress SARS-CoV-2/FLU/RSV testing.  Fact Sheet for Patients: BloggerCourse.com  Fact Sheet for Healthcare Providers: SeriousBroker.it  This test is not yet  approved or cleared by the [place] FDA and has been authorized for detection and/or diagnosis of SARS-CoV-2 by FDA under an Emergency Use Authorization (EUA). This EUA will remain in effect (meaning this test can be used) for the duration of the COVID-19 declaration under Section 564(b)(1) of the Act, 21 U.S.C. section 360bbb-3(b)(1), unless the authorization is terminated or revoked.     Resp Syncytial Virus by PCR NEGATIVE NEGATIVE Final    Comment: (NOTE) Fact Sheet for Patients: BloggerCourse.com  Fact Sheet for Healthcare Providers: SeriousBroker.it  This test is not yet approved or cleared by the [place] FDA and has been authorized for detection and/or diagnosis of SARS-CoV-2 by FDA under an Emergency Use Authorization (EUA). This EUA will remain in effect (meaning this test can be used) for the duration of the COVID-19 declaration under Section 564(b)(1) of the Act, 21 U.S.C. section 360bbb-3(b)(1), unless the authorization is terminated or revoked.  Performed at [facility], [address] [place], [place] [address]   Blood Culture (routine x 2)     Status:  None (Preliminary result)   Collection Time: 05/22/22 11:10 AM   Specimen: BLOOD  Result Value Ref Range Status   Specimen Description BLOOD RIGHT ANTECUBITAL  Final   Special Requests   Final    BOTTLES DRAWN AEROBIC AND ANAEROBIC Blood Culture results may not be optimal due to an excessive volume of blood received in culture bottles   Culture   Final    NO GROWTH 2 DAYS Performed at [facility], [address] [place], [place] [address]    Report Status PENDING  Incomplete  Blood Culture (routine x 2)     Status: None (Preliminary result)   Collection Time: 05/22/22 11:36 AM   Specimen: BLOOD RIGHT FOREARM  Result Value Ref Range Status   Specimen Description BLOOD RIGHT FOREARM  Final   Special Requests   Final    BOTTLES DRAWN  AEROBIC AND ANAEROBIC Blood Culture adequate volume   Culture   Final    NO GROWTH 2 DAYS Performed at [facility], [address], [place], [place] [address]    Report Status PENDING  Incomplete         Radiology Studies: DG Hips Bilat W or Wo Pelvis 3-4 Views  Result Date: 05/22/2022 CLINICAL DATA:  Pain after fall EXAM: DG HIP (WITH OR WITHOUT PELVIS) 4V BILAT COMPARISON:  None Available. FINDINGS: There is no evidence of hip fracture or dislocation. There is no evidence of arthropathy or other focal bone abnormality. IMPRESSION: No acute osseous abnormality Electronically Signed   By: Karen Kays M.D.   On: 05/22/2022 13:09   CT Head Wo Contrast  Result Date: 05/22/2022 CLINICAL DATA:  Head trauma, minor (Age >= 65y); Neck trauma (Age >= 65y) EXAM: CT HEAD WITHOUT CONTRAST CT CERVICAL SPINE WITHOUT CONTRAST TECHNIQUE: Multidetector CT imaging of the head and cervical spine was performed following the standard protocol without intravenous contrast. Multiplanar CT image reconstructions of the cervical spine were also generated. RADIATION DOSE REDUCTION: This exam was performed according to the departmental dose-optimization program which includes automated exposure control, adjustment of the mA and/or kV according to patient size and/or use of iterative reconstruction technique. COMPARISON:  01/26/2022 FINDINGS: CT HEAD FINDINGS Brain: No evidence of acute infarction, hemorrhage, hydrocephalus, extra-axial collection or mass lesion/mass effect. Age related cerebral volume loss. Vascular: No hyperdense vessel or unexpected calcification. Skull: Normal. Negative for fracture or focal lesion. Sinuses/Orbits: No acute finding. Other: None. CT CERVICAL SPINE FINDINGS Technical note: Images are degraded by patient motion artifact. Alignment: Facet joints are aligned without dislocation or traumatic listhesis. Dens and lateral masses are aligned. Skull base and vertebrae: No acute fracture. No  primary bone lesion or focal pathologic process. Soft tissues and spinal canal: No prevertebral fluid or swelling. No visible canal hematoma. Disc levels: Degenerative disc disease of C5-6. Advanced multilevel bilateral facet arthropathy. Upper chest: Negative. Other: Bilateral carotid atherosclerosis. IMPRESSION: 1. No acute intracranial abnormality. 2. No acute fracture or subluxation of the cervical spine. Electronically Signed   By: Duanne Guess D.O.   On: 05/22/2022 12:54   CT Cervical Spine Wo Contrast  Result Date: 05/22/2022 CLINICAL DATA:  Head trauma, minor (Age >= 65y); Neck trauma (Age >= 65y) EXAM: CT HEAD WITHOUT CONTRAST CT CERVICAL SPINE WITHOUT CONTRAST TECHNIQUE: Multidetector CT imaging of the head and cervical spine was performed following the standard protocol without intravenous contrast. Multiplanar CT image reconstructions of the cervical spine were also generated. RADIATION DOSE REDUCTION: This exam was performed according  to the departmental dose-optimization program which includes automated exposure control, adjustment of the mA and/or kV according to patient size and/or use of iterative reconstruction technique. COMPARISON:  01/26/2022 FINDINGS: CT HEAD FINDINGS Brain: No evidence of acute infarction, hemorrhage, hydrocephalus, extra-axial collection or mass lesion/mass effect. Age related cerebral volume loss. Vascular: No hyperdense vessel or unexpected calcification. Skull: Normal. Negative for fracture or focal lesion. Sinuses/Orbits: No acute finding. Other: None. CT CERVICAL SPINE FINDINGS Technical note: Images are degraded by patient motion artifact. Alignment: Facet joints are aligned without dislocation or traumatic listhesis. Dens and lateral masses are aligned. Skull base and vertebrae: No acute fracture. No primary bone lesion or focal pathologic process. Soft tissues and spinal canal: No prevertebral fluid or swelling. No visible canal hematoma. Disc levels:  Degenerative disc disease of C5-6. Advanced multilevel bilateral facet arthropathy. Upper chest: Negative. Other: Bilateral carotid atherosclerosis. IMPRESSION: 1. No acute intracranial abnormality. 2. No acute fracture or subluxation of the cervical spine. Electronically Signed   By: Duanne Guess D.O.   On: 05/22/2022 12:54   DG Chest Port 1 View  Result Date: 05/22/2022 CLINICAL DATA:  Sepsis EXAM: PORTABLE CHEST 1 VIEW COMPARISON:  03/21/2022 FINDINGS: Single lead left-sided implanted cardiac device remains in place. Stable cardiomegaly status post TAVR. Aortic atherosclerosis. Coarsened interstitial markings bilaterally with new patchy right lower lobe airspace opacity. No pleural effusion or pneumothorax. IMPRESSION: New patchy right lower lobe airspace opacity, suspicious for pneumonia. Electronically Signed   By: Duanne Guess D.O.   On: 05/22/2022 11:55        Scheduled Meds:  amLODipine  10 mg Oral Daily   apixaban  5 mg Oral BID   budesonide (PULMICORT) nebulizer solution  2 mg Nebulization Q12H   doxycycline  100 mg Oral Q12H   insulin aspart  0-20 Units Subcutaneous TID WC   ipratropium-albuterol  3 mL Nebulization Q6H   irbesartan  300 mg Oral Daily   loratadine  10 mg Oral Daily   predniSONE  40 mg Oral Q breakfast   rOPINIRole  4 mg Oral Q2000   Continuous Infusions:  cefTRIAXone (ROCEPHIN)  IV 2 g (05/24/22 0926)     LOS: 2 days        Huey Bienenstock, MD [facility]

## 2022-05-25 ENCOUNTER — Inpatient Hospital Stay (HOSPITAL_COMMUNITY): Payer: Medicare HMO

## 2022-05-25 DIAGNOSIS — A419 Sepsis, unspecified organism: Secondary | ICD-10-CM

## 2022-05-25 DIAGNOSIS — R0603 Acute respiratory distress: Secondary | ICD-10-CM | POA: Diagnosis not present

## 2022-05-25 DIAGNOSIS — J189 Pneumonia, unspecified organism: Secondary | ICD-10-CM | POA: Diagnosis not present

## 2022-05-25 DIAGNOSIS — J45901 Unspecified asthma with (acute) exacerbation: Secondary | ICD-10-CM | POA: Diagnosis not present

## 2022-05-25 LAB — BLOOD GAS, VENOUS
Acid-base deficit: 2.7 mmol/L — ABNORMAL HIGH (ref 0.0–2.0)
Bicarbonate: 22.6 mmol/L (ref 20.0–28.0)
Drawn by: 68614
O2 Saturation: 85.3 %
Patient temperature: 36.4
pCO2, Ven: 39 mmHg — ABNORMAL LOW (ref 44–60)
pH, Ven: 7.37 (ref 7.25–7.43)
pO2, Ven: 51 mmHg — ABNORMAL HIGH (ref 32–45)

## 2022-05-25 LAB — BASIC METABOLIC PANEL
Anion gap: 15 (ref 5–15)
BUN: 29 mg/dL — ABNORMAL HIGH (ref 8–23)
CO2: 19 mmol/L — ABNORMAL LOW (ref 22–32)
Calcium: 9.2 mg/dL (ref 8.9–10.3)
Chloride: 100 mmol/L (ref 98–111)
Creatinine, Ser: 0.98 mg/dL (ref 0.44–1.00)
GFR, Estimated: >60 mL/min (ref >60)
Glucose, Bld: 193 mg/dL — ABNORMAL HIGH (ref 70–99)
Potassium: 3.6 mmol/L (ref 3.5–5.1)
Sodium: 134 mmol/L — ABNORMAL LOW (ref 135–145)

## 2022-05-25 LAB — CBC
HCT: 40.8 % (ref 36.0–46.0)
Hemoglobin: 13.3 g/dL (ref 12.0–15.0)
MCH: 30.9 pg (ref 26.0–34.0)
MCHC: 32.6 g/dL (ref 30.0–36.0)
MCV: 94.9 fL (ref 80.0–100.0)
Platelets: 166 10*3/uL (ref 150–400)
RBC: 4.3 MIL/uL (ref 3.87–5.11)
RDW: 13.3 % (ref 11.5–15.5)
WBC: 14.4 10*3/uL — ABNORMAL HIGH (ref 4.0–10.5)
nRBC: 0 % (ref 0.0–0.2)

## 2022-05-25 LAB — PHOSPHORUS: Phosphorus: 3.5 mg/dL (ref 2.5–4.6)

## 2022-05-25 LAB — GLUCOSE, CAPILLARY
Glucose-Capillary: 303 mg/dL — ABNORMAL HIGH (ref 70–99)
Glucose-Capillary: 329 mg/dL — ABNORMAL HIGH (ref 70–99)
Glucose-Capillary: 176 mg/dL — ABNORMAL HIGH (ref 70–99)
Glucose-Capillary: 155 mg/dL — ABNORMAL HIGH (ref 70–99)

## 2022-05-25 LAB — MAGNESIUM: Magnesium: 2.5 mg/dL — ABNORMAL HIGH (ref 1.7–2.4)

## 2022-05-25 MED ORDER — INSULIN ASPART 100 UNIT/ML IJ SOLN
4.0000 [IU] | Freq: Three times a day (TID) | INTRAMUSCULAR | Status: DC
  Administered 2022-05-25 – 2022-05-26 (×5): 4 [IU] via SUBCUTANEOUS

## 2022-05-25 MED ORDER — IPRATROPIUM-ALBUTEROL 0.5-2.5 (3) MG/3ML IN SOLN
3.0000 mL | RESPIRATORY_TRACT | Status: DC
  Administered 2022-05-25 – 2022-05-28 (×17): 3 mL via RESPIRATORY_TRACT
  Filled 2022-05-25 (×12): qty 3
  Filled 2022-05-25: qty 6
  Filled 2022-05-25: qty 9
  Filled 2022-05-25 (×2): qty 3

## 2022-05-25 MED ORDER — INSULIN GLARGINE-YFGN 100 UNIT/ML ~~LOC~~ SOLN
15.0000 [IU] | Freq: Every day | SUBCUTANEOUS | Status: DC
  Administered 2022-05-25 – 2022-05-26 (×2): 15 [IU] via SUBCUTANEOUS
  Filled 2022-05-25 (×3): qty 0.15

## 2022-05-25 MED ORDER — METHYLPREDNISOLONE SODIUM SUCC 125 MG IJ SOLR
125.0000 mg | INTRAMUSCULAR | Status: AC
  Administered 2022-05-25: 125 mg via INTRAVENOUS

## 2022-05-25 MED ORDER — METHYLPREDNISOLONE SODIUM SUCC 125 MG IJ SOLR: 80.0000 mg | INTRAMUSCULAR | Status: DC

## 2022-05-25 MED ORDER — FUROSEMIDE 10 MG/ML IJ SOLN: 20.0000 mg | INTRAMUSCULAR | Status: DC

## 2022-05-25 MED ORDER — FUROSEMIDE 10 MG/ML IJ SOLN
40.0000 mg | INTRAMUSCULAR | Status: AC
  Administered 2022-05-25: 40 mg via INTRAVENOUS
  Filled 2022-05-25: qty 4

## 2022-05-25 MED ORDER — METHYLPREDNISOLONE SODIUM SUCC 125 MG IJ SOLR
INTRAMUSCULAR | Status: AC
  Filled 2022-05-25: qty 2

## 2022-05-25 MED ORDER — METHYLPREDNISOLONE SODIUM SUCC 125 MG IJ SOLR
125.0000 mg | INTRAMUSCULAR | Status: AC
  Administered 2022-05-25: 125 mg via INTRAVENOUS
  Filled 2022-05-25: qty 2

## 2022-05-25 MED ORDER — SODIUM CHLORIDE 0.9 % IV SOLN
3.0000 g | Freq: Three times a day (TID) | INTRAVENOUS | Status: DC
  Administered 2022-05-25 – 2022-05-30 (×16): 3 g via INTRAVENOUS
  Filled 2022-05-25 (×16): qty 8

## 2022-05-25 MED ORDER — MORPHINE SULFATE (PF) 2 MG/ML IV SOLN
1.0000 mg | INTRAVENOUS | Status: AC
  Administered 2022-05-25: 1 mg via INTRAVENOUS
  Filled 2022-05-25: qty 1

## 2022-05-25 MED ORDER — ALUM & MAG HYDROXIDE-SIMETH 200-200-20 MG/5ML PO SUSP
30.0000 mL | ORAL | Status: DC | PRN
  Administered 2022-05-25: 30 mL via ORAL
  Filled 2022-05-25: qty 30

## 2022-05-25 MED ORDER — FUROSEMIDE 10 MG/ML IJ SOLN
INTRAMUSCULAR | Status: AC
  Filled 2022-05-25: qty 4

## 2022-05-25 MED ORDER — METHYLPREDNISOLONE SODIUM SUCC 40 MG IJ SOLR: 40.0000 mg | Freq: Two times a day (BID) | INTRAMUSCULAR | Status: DC

## 2022-05-25 MED ORDER — GUAIFENESIN ER 600 MG PO TB12
600.0000 mg | ORAL_TABLET | Freq: Two times a day (BID) | ORAL | Status: DC
  Administered 2022-05-25 – 2022-05-30 (×11): 600 mg via ORAL
  Filled 2022-05-25 (×11): qty 1

## 2022-05-25 MED ORDER — METHYLPREDNISOLONE SODIUM SUCC 125 MG IJ SOLR
60.0000 mg | Freq: Two times a day (BID) | INTRAMUSCULAR | Status: DC
  Administered 2022-05-25 – 2022-05-27 (×5): 60 mg via INTRAVENOUS
  Filled 2022-05-25 (×5): qty 2

## 2022-05-25 MED ORDER — PANTOPRAZOLE SODIUM 40 MG PO TBEC
40.0000 mg | DELAYED_RELEASE_TABLET | Freq: Every day | ORAL | Status: DC
  Administered 2022-05-25 – 2022-05-30 (×6): 40 mg via ORAL
  Filled 2022-05-25 (×6): qty 1

## 2022-05-25 MED ORDER — FUROSEMIDE 10 MG/ML IJ SOLN
40.0000 mg | INTRAMUSCULAR | Status: AC
  Administered 2022-05-25: 40 mg via INTRAVENOUS

## 2022-05-25 MED ORDER — MAGNESIUM SULFATE 4 GM/100ML IV SOLN
4.0000 g | INTRAVENOUS | Status: AC
  Administered 2022-05-25: 4 g via INTRAVENOUS
  Filled 2022-05-25: qty 100

## 2022-05-25 MED ORDER — SODIUM CHLORIDE 3 % IN NEBU
4.0000 mL | INHALATION_SOLUTION | RESPIRATORY_TRACT | Status: AC
  Administered 2022-05-25: 4 mL via RESPIRATORY_TRACT
  Filled 2022-05-25: qty 4

## 2022-05-25 MED ORDER — ALBUTEROL SULFATE (2.5 MG/3ML) 0.083% IN NEBU
10.0000 mg/h | INHALATION_SOLUTION | RESPIRATORY_TRACT | Status: DC
  Administered 2022-05-25: 10 mg/h via RESPIRATORY_TRACT
  Filled 2022-05-25: qty 12

## 2022-05-25 NOTE — Progress Notes (Signed)
PROGRESS NOTE        PATIENT DETAILS Name: Briana Foster Age: 77 y.o. Sex: female Date of Birth: 1945-11-09 Admit Date: 05/22/2022 Admitting Physician Emeline General, MD AOZ:[id number], Jerelyn Scott, MD  Brief Summary: Patient is a 77 y.o.  female with history of asthma, chronic HFpEF, HTN, permanent atrial fibrillation, tachybradycardia syndrome-s/p PPM implantation, history of aortic stenosis-s/p TAVR-who presented with cough, fever, shortness of breath, and a mechanical fall-patient was found to have RLL PNA and asthma exacerbation.  Significant events: 4/21>> admit to [facility]  Significant studies: 4/21>> CXR: Patchy RLL opacity 4/21>> CT head: No acute intracranial abnormality 4/21>> CT C-spine: No fracture/subluxation 4/21>> X hip: No acute osseous abnormality. 4/24>> CXR: Slightly increased RLL opacity  Significant microbiology data: 4/21>> blood culture: Negative 4/21>> COVID/influenza/RSV: Negative 4/21>> respiratory virus panel: Meta pneumonia virus  Procedures: None  Consults: None  Subjective:   Objective: Vitals: Blood pressure (!) 113/51, pulse 64, temperature (!) 97.5 F (36.4 C), temperature source Oral, resp. rate (!) 33, height 5\' 8"  (1.727 m), weight 90.7 kg, SpO2 97 %.   Exam: Gen Exam:Alert awake-not in any distress HEENT:atraumatic, normocephalic Chest: B/L clear to auscultation anteriorly CVS:S1S2 regular Abdomen:soft non tender, non distended Extremities:no edema Neurology: Non focal Skin: no rash  Pertinent Labs/Radiology:    Latest Ref Rng & Units 05/25/2022    3:31 AM 05/23/2022    4:24 AM 05/22/2022   11:20 AM  CBC  WBC 4.0 - 10.5 K/uL 14.4  6.3    Hemoglobin 12.0 - 15.0 g/dL 84.6  96.2  95.2   Hematocrit 36.0 - 46.0 % 40.8  36.8  41.0   Platelets 150 - 400 K/uL 166  139      Lab Results  Component Value Date   NA 134 (L) 05/25/2022   K 3.6 05/25/2022   CL 100 05/25/2022   CO2 19 (L) 05/25/2022       Assessment/Plan: Acute hypoxic respiratory failure due to RLL PNA and asthma exacerbation Developed respiratory distress overnight-given 1 dose of Lasix, bronchodilators.  Remains relatively stable on 4-5 L of oxygen this morning. Although respiratory virus panel positive for Meta pneumonia concerned that she may have aspirated over the past several days.  Switch to Unasyn-keep n.p.o. until SLP eval Continue IV steroids/bronchodilators Add Mucinex-add flutter/incentive spirometry-and encourage aggressive pulmonary toileting.    Sepsis secondary to RLL PNA Continue Unasyn/doxycycline Cultures negative so far See above  Permanent fibrillation Not on any medications for rate control Continue Eliquis  HTN BP stable Amlodipine/Avapro Follow/optimize  DM-2 (A1c 8.1 on 4/22) with uncontrolled hyperglycemia due to steroids CBGs uncontrolled Add Semglee 15 units 4 units of Premeal NovoLog Continue resistant SSI Follow/optimize  Tachybradycardia syndrome-history of PPM implantation-September 2022  History of severe arctic stenosis-s/p TAVR May 2022  RLS Requip  Obesity Estimated body mass index is 30.41 kg/m as calculated from the following:   Height as of this encounter: 5\' 8"  (1.727 m).   Weight as of this encounter: 90.7 kg.   Code status:   Code Status: Full Code   DVT Prophylaxis: apixaban (ELIQUIS) tablet 5 mg    Family Communication: None at bedside  Disposition Plan: Status is: Inpatient Remains inpatient appropriate because: Severity of illness   Planned Discharge Destination: Home health with SNF-based on clinical course.   Diet: Diet Order  Diet NPO time specified  Diet effective now                     Antimicrobial agents: Anti-infectives (From admission, onward)    Start     Dose/Rate Route Frequency Ordered Stop   05/25/22 0830  Ampicillin-Sulbactam (UNASYN) 3 g in sodium chloride 0.9 % 100 mL IVPB        3 g 200 mL/hr  over 30 Minutes Intravenous Every 8 hours 05/25/22 0737     05/24/22 0830  cefTRIAXone (ROCEPHIN) 2 g in sodium chloride 0.9 % 100 mL IVPB  Status:  Discontinued        2 g 200 mL/hr over 30 Minutes Intravenous Every 24 hours 05/24/22 0734 05/25/22 0720   05/23/22 1000  doxycycline (VIBRA-TABS) tablet 100 mg        100 mg Oral Every 12 hours 05/23/22 0956 05/28/22 0959   05/22/22 1115  cefTRIAXone (ROCEPHIN) 2 g in sodium chloride 0.9 % 100 mL IVPB  Status:  Discontinued        2 g 200 mL/hr over 30 Minutes Intravenous Every 24 hours 05/22/22 1105 05/23/22 0955   05/22/22 1115  azithromycin (ZITHROMAX) 500 mg in sodium chloride 0.9 % 250 mL IVPB  Status:  Discontinued        500 mg 250 mL/hr over 60 Minutes Intravenous Every 24 hours 05/22/22 1105 05/23/22 0955        MEDICATIONS: Scheduled Meds:  amLODipine  10 mg Oral Daily   apixaban  5 mg Oral BID   budesonide (PULMICORT) nebulizer solution  2 mg Nebulization Q12H   doxycycline  100 mg Oral Q12H   furosemide       guaiFENesin  600 mg Oral BID   insulin aspart  0-20 Units Subcutaneous TID WC   ipratropium-albuterol  3 mL Nebulization Q4H   irbesartan  300 mg Oral Daily   loratadine  10 mg Oral Daily   methylPREDNISolone (SOLU-MEDROL) injection  60 mg Intravenous BID   methylPREDNISolone sodium succinate       rOPINIRole  4 mg Oral Q2000   Continuous Infusions:  albuterol 10 mg/hr (05/25/22 0140)   ampicillin-sulbactam (UNASYN) IV     PRN Meds:.acetaminophen, albuterol, benzonatate, furosemide, meclizine, methylPREDNISolone sodium succinate, ondansetron (ZOFRAN) IV   I have personally reviewed following labs and imaging studies  LABORATORY DATA: CBC: Recent Labs  Lab 05/22/22 1110 05/22/22 1120 05/23/22 0424 05/25/22 0331  WBC 8.8  --  6.3 14.4*  NEUTROABS 6.5  --   --   --   HGB 13.2 13.9 12.4 13.3  HCT 40.6 41.0 36.8 40.8  MCV 94.4  --  92.9 94.9  PLT 156  --  139* 166    Basic Metabolic Panel: Recent  Labs  Lab 05/22/22 1110 05/22/22 1120 05/23/22 0424 05/25/22 0331  NA 136 138 134* 134*  K 3.2* 3.2* 3.8 3.6  CL 101  --  102 100  CO2 20*  --  20* 19*  GLUCOSE 207*  --  286* 193*  BUN 11  --  17 29*  CREATININE 0.92  --  0.80 0.98  CALCIUM 9.3  --  9.3 9.2  MG  --   --   --  2.5*  PHOS  --   --   --  3.5    GFR: Estimated Creatinine Clearance: 59.3 mL/min (by C-G formula based on SCr of 0.98 mg/dL).  Liver Function Tests: Recent Labs  Lab 05/22/22 1110  AST 32  ALT 30  ALKPHOS 72  BILITOT 1.3*  PROT 7.1  ALBUMIN 4.0   No results for input(s): "LIPASE", "AMYLASE" in the last 168 hours. No results for input(s): "AMMONIA" in the last 168 hours.  Coagulation Profile: Recent Labs  Lab 05/22/22 1110  INR 1.3*    Cardiac Enzymes: No results for input(s): "CKTOTAL", "CKMB", "CKMBINDEX", "TROPONINI" in the last 168 hours.  BNP (last 3 results) No results for input(s): "PROBNP" in the last 8760 hours.  Lipid Profile: No results for input(s): "CHOL", "HDL", "LDLCALC", "TRIG", "CHOLHDL", "LDLDIRECT" in the last 72 hours.  Thyroid Function Tests: Recent Labs    05/22/22 1110  TSH 0.512  FREET4 0.86    Anemia Panel: Recent Labs    05/24/22 1155  VITAMINB12 553  FOLATE 21.0    Urine analysis:    Component Value Date/Time   COLORURINE YELLOW 02/01/2022 0344   APPEARANCEUR Clear 02/16/2022 1316   LABSPEC >1.046 (H) 02/01/2022 0344   PHURINE 5.0 02/01/2022 0344   GLUCOSEU Negative 02/16/2022 1316   HGBUR LARGE (A) 02/01/2022 0344   BILIRUBINUR Negative 02/16/2022 1316   KETONESUR NEGATIVE 02/01/2022 0344   PROTEINUR 1+ (A) 02/16/2022 1316   PROTEINUR 100 (A) 02/01/2022 0344   NITRITE Negative 02/16/2022 1316   NITRITE NEGATIVE 02/01/2022 0344   LEUKOCYTESUR Negative 02/16/2022 1316   LEUKOCYTESUR SMALL (A) 02/01/2022 0344    Sepsis Labs: Lactic Acid, Venous    Component Value Date/Time   LATICACIDVEN 2.2 (HH) 05/22/2022 1305     MICROBIOLOGY: Recent Results (from the past 240 hour(s))  Resp panel by RT-PCR (RSV, Flu A&B, Covid) Anterior Nasal Swab     Status: None   Collection Time: 05/22/22 11:05 AM   Specimen: Anterior Nasal Swab  Result Value Ref Range Status   SARS Coronavirus 2 by RT PCR NEGATIVE NEGATIVE Final   Influenza A by PCR NEGATIVE NEGATIVE Final   Influenza B by PCR NEGATIVE NEGATIVE Final    Comment: (NOTE) The Xpert Xpress SARS-CoV-2/FLU/RSV plus assay is intended as an aid in the diagnosis of influenza from Nasopharyngeal swab specimens and should not be used as a sole basis for treatment. Nasal washings and aspirates are unacceptable for Xpert Xpress SARS-CoV-2/FLU/RSV testing.  Fact Sheet for Patients: BloggerCourse.com  Fact Sheet for Healthcare Providers: SeriousBroker.it  This test is not yet approved or cleared by the [place] FDA and has been authorized for detection and/or diagnosis of SARS-CoV-2 by FDA under an Emergency Use Authorization (EUA). This EUA will remain in effect (meaning this test can be used) for the duration of the COVID-19 declaration under Section 564(b)(1) of the Act, 21 U.S.C. section 360bbb-3(b)(1), unless the authorization is terminated or revoked.     Resp Syncytial Virus by PCR NEGATIVE NEGATIVE Final    Comment: (NOTE) Fact Sheet for Patients: BloggerCourse.com  Fact Sheet for Healthcare Providers: SeriousBroker.it  This test is not yet approved or cleared by the [place] FDA and has been authorized for detection and/or diagnosis of SARS-CoV-2 by FDA under an Emergency Use Authorization (EUA). This EUA will remain in effect (meaning this test can be used) for the duration of the COVID-19 declaration under Section 564(b)(1) of the Act, 21 U.S.C. section 360bbb-3(b)(1), unless the authorization is terminated or revoked.  Performed at  [facility], [address], [place], [place] [address]   Blood Culture (routine x 2)     Status: None (Preliminary result)   Collection Time: 05/22/22 11:10 AM  Specimen: BLOOD  Result Value Ref Range Status   Specimen Description BLOOD RIGHT ANTECUBITAL  Final   Special Requests   Final    BOTTLES DRAWN AEROBIC AND ANAEROBIC Blood Culture results may not be optimal due to an excessive volume of blood received in culture bottles   Culture   Final    NO GROWTH 3 DAYS Performed at [facility], [address] [place], [place] [address]    Report Status PENDING  Incomplete  Blood Culture (routine x 2)     Status: None (Preliminary result)   Collection Time: 05/22/22 11:36 AM   Specimen: BLOOD RIGHT FOREARM  Result Value Ref Range Status   Specimen Description BLOOD RIGHT FOREARM  Final   Special Requests   Final    BOTTLES DRAWN AEROBIC AND ANAEROBIC Blood Culture adequate volume   Culture   Final    NO GROWTH 3 DAYS Performed at [facility], [address], [place], [place] [address]    Report Status PENDING  Incomplete  Respiratory (~20 pathogens) panel by PCR     Status: Abnormal   Collection Time: 05/24/22 12:37 PM   Specimen: Nasopharyngeal Swab; Respiratory  Result Value Ref Range Status   Adenovirus NOT DETECTED NOT DETECTED Final   Coronavirus 229E NOT DETECTED NOT DETECTED Final    Comment: (NOTE) The Coronavirus on the Respiratory Panel, DOES NOT test for the novel  Coronavirus (2019 nCoV)    Coronavirus HKU1 NOT DETECTED NOT DETECTED Final   Coronavirus NL63 NOT DETECTED NOT DETECTED Final   Coronavirus OC43 NOT DETECTED NOT DETECTED Final   Metapneumovirus DETECTED (A) NOT DETECTED Final   Rhinovirus / Enterovirus NOT DETECTED NOT DETECTED Final   Influenza A NOT DETECTED NOT DETECTED Final   Influenza B NOT DETECTED NOT DETECTED Final   Parainfluenza Virus 1 NOT DETECTED NOT DETECTED Final   Parainfluenza Virus 2 NOT DETECTED NOT DETECTED  Final   Parainfluenza Virus 3 NOT DETECTED NOT DETECTED Final   Parainfluenza Virus 4 NOT DETECTED NOT DETECTED Final   Respiratory Syncytial Virus NOT DETECTED NOT DETECTED Final   Bordetella pertussis NOT DETECTED NOT DETECTED Final   Bordetella Parapertussis NOT DETECTED NOT DETECTED Final   Chlamydophila pneumoniae NOT DETECTED NOT DETECTED Final   Mycoplasma pneumoniae NOT DETECTED NOT DETECTED Final    Comment: Performed at [facility], [address] [place], [place] [address]    RADIOLOGY STUDIES/RESULTS: DG CHEST PORT 1 VIEW  Result Date: 05/25/2022 CLINICAL DATA:  Wheezing. EXAM: PORTABLE CHEST 1 VIEW COMPARISON:  05/22/2022. FINDINGS: The heart is enlarged and the mediastinal contour is within normal limits. A TAVR stent is noted. There is atherosclerotic calcification of the aorta. Interstitial prominence is noted bilaterally. Airspace opacities are noted in the mid to lower right lung, slightly increased from the prior exam. No effusion or pneumothorax. A single lead pacemaker device is present over the left chest. No acute osseous abnormality. IMPRESSION: Airspace opacities in the mid to lower right lung field, slightly increased from the prior exam. Electronically Signed   By: Thornell Sartorius M.D.   On: 05/25/2022 02:27     LOS: 3 days   Jeoffrey Massed, MD  [facility]    To contact the attending provider between 7A-7P or the covering provider during after hours 7P-7A, please log into the web site [email or web address] and access using universal [place] password for that web site. If you do not have the password, please call  the hospital operator.  05/25/2022, 9:01 AM

## 2022-05-25 NOTE — Progress Notes (Signed)
Pharmacy Antibiotic Note  Briana Foster is a 77 y.o. female admitted on 05/22/2022 with pneumonia.  Pharmacy has been consulted for Unasyn dosing. WBC 6.3>>14.4. Renal function ok.   Plan: Unasyn 3g IV q8h  Height:  (172.7 cm) Weight: 90.7 kg (200 lb) IBW/kg (Calculated) : 63.9  Temp (24hrs), Avg:97.7 F (36.5 C), Min:97 F (36.1 C), Max:98.1 F (36.7 C)  Recent Labs  Lab 05/22/22 1110 05/22/22 1305 05/23/22 0424 05/25/22 0331  WBC 8.8  --  6.3 14.4*  CREATININE 0.92  --  0.80 0.98  LATICACIDVEN 2.9* 2.2*  --   --     Estimated Creatinine Clearance: 59.3 mL/min (by C-G formula based on SCr of 0.98 mg/dL).    Allergies  Allergen Reactions   Bee Venom Shortness Of Breath and Rash   Claritin [Loratadine] Itching and Palpitations   Wasp Venom Other (See Comments) and Anaphylaxis   Apricot Flavor Swelling    Eyes swell shut   Atorvastatin Itching    Eye swelling Other reaction(s): HA   Losartan Itching and Rash    Abran Duke, PharmD, BCPS Clinical Pharmacist Phone: [phone]

## 2022-05-25 NOTE — Evaluation (Signed)
Clinical/Bedside Swallow Evaluation Patient Details  Name: Briana Foster MRN: [id number] Date of Birth: Nov 03, 1945  Today's Date: 05/25/2022 Time: SLP Start Time (ACUTE ONLY): 4098 SLP Stop Time (ACUTE ONLY): 1010 SLP Time Calculation (min) (ACUTE ONLY): 15 min  Past Medical History:  Past Medical History:  Diagnosis Date   Anemia    Arthritis 07-20-12   hands   Asthma    Back pain    Carotid arterial disease    Class 1 obesity with serious comorbidity and body mass index (BMI) of 31.0 to 31.9 in adult 03/26/2018   Depression    Essential hypertension 03/26/2018   GERD (gastroesophageal reflux disease)    "comes and goes" - no meds currently   Hyperlipidemia    Joint pain    Persistent atrial fibrillation    Restless leg syndrome    Rheumatoid arthritis    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   s/p TAVR with a 23 mm Edwards S3U via the TF approach by Dr. Clifton James & Dr. Cornelius Moras   Severe aortic stenosis    Type 2 diabetes mellitus without complication, without long-term current use of insulin 04/10/2018   Vitamin D deficiency    Past Surgical History:  Past Surgical History:  Procedure Laterality Date   AV NODE ABLATION N/A 07/14/2021   Procedure: AV NODE ABLATION;  Surgeon: Regan Lemming, MD;  Location: [facility] INVASIVE CV LAB;  Service: Cardiovascular;  Laterality: N/A;   BUNIONECTOMY  20 yrs ago   bil feet   BUNIONECTOMY  11/30/2011   Procedure: BUNIONECTOMY;  Surgeon: Drucilla Schmidt, MD;  Location: [facility] ORS;  Service: Orthopedics;  Laterality: Bilateral;  RIGHT FOOT EXCISION OF BUNIONETTE AND PARTIAL   PROXIMAL PHALANGECTOMY OF 5TH TOE LEFT FOOT FUNK BUNIONECTOMY,EXCISION OF BUNIONETTE    CARDIOVERSION N/A 11/05/2019   Procedure: CARDIOVERSION;  Surgeon: Lewayne Bunting, MD;  Location: [facility] ENDOSCOPY;  Service: Cardiovascular;  Laterality: N/A;   CARDIOVERSION N/A 12/05/2019   Procedure: CARDIOVERSION;  Surgeon: Parke Poisson, MD;  Location: [facility]  ENDOSCOPY;  Service: Cardiovascular;  Laterality: N/A;   COLONOSCOPY WITH PROPOFOL N/A 08/07/2012   Procedure: COLONOSCOPY WITH PROPOFOL;  Surgeon: Charolett Bumpers, MD;  Location: [facility] ENDOSCOPY;  Service: Endoscopy;  Laterality: N/A;   MOUTH SURGERY     teeth extractions   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   Procedure: PACEMAKER IMPLANT;  Surgeon: Regan Lemming, MD;  Location: [facility];  Service: Cardiovascular;  Laterality: N/A;   RIGHT/LEFT HEART CATH AND CORONARY ANGIOGRAPHY N/A 06/17/2020   Procedure: RIGHT/LEFT HEART CATH AND CORONARY ANGIOGRAPHY;  Surgeon: Lyn Records, MD;  Location: [facility] CV LAB;  Service: Cardiovascular;  Laterality: N/A;   TONSILLECTOMY     TRANSCATHETER AORTIC VALVE REPLACEMENT, TRANSFEMORAL N/A 06/30/2020   Procedure: TRANSCATHETER AORTIC VALVE REPLACEMENT, TRANSFEMORAL;  Surgeon: Kathleene Hazel, MD;  Location: [facility];  Service: Open Heart Surgery;  Laterality: N/A;   TUBAL LIGATION     WRIST SURGERY     HPI:  Patient is a 77 y.o. female with PMH: HTN, chronic HFpEF, IIDM, PAF on Eliquis, tachycradycardia syndrome s/p AV node ablation and PPM, AS s/p TACR. She presented to the hospital on 05/22/2022 with two days of cough, wheezing, SOB and fever. In ED, patient had low grade fever of 100.8, was tachypneic, easily desaturated but stabalized on 4L. CXR showed right lower lobe infiltrates and patient with diffuse wheezing on physical exam. She was made NPO pending SLP  swallow evaluation secondary to concern for aspiration.    Assessment / Plan / Recommendation  Clinical Impression  Patient is not currently presenting with clinical s/s of dysphagia as per this bedside swallow evaluation. She denies any h/o dysphagia and denies h/o GERD/esophageal dysphagia. SLP assessed her PO toleration with thin liquids, puree solids, regular solids. Mastication was efficient, no oral delays observed and swallow initiation appeared timely. No  significant change in patient's SpO2 which remained at 98% and RR maintained at 20-22. Patient did have intermittent dry sounding cough, however this did not occur frequently and did not appear related to PO intake. SLP recommending initiate PO diet, regular solids, thin liquids and no f/u warranted at this time. SLP Visit Diagnosis: Dysphagia, unspecified (R13.10)    Aspiration Risk  Mild aspiration risk    Diet Recommendation Thin liquid;Regular   Medication Administration: Whole meds with liquid Supervision: Patient able to self feed Compensations: Slow rate Postural Changes: Seated upright at 90 degrees    Other  Recommendations Oral Care Recommendations: Oral care BID;Patient independent with oral care    Recommendations for follow up therapy are one component of a multi-disciplinary discharge planning process, led by the attending physician.  Recommendations may be updated based on patient status, additional functional criteria and insurance authorization.  Follow up Recommendations No SLP follow up      Assistance Recommended at Discharge    Functional Status Assessment Patient has not had a recent decline in their functional status  Frequency and Duration      N/A      Prognosis   N/A     Swallow Study   General Date of Onset: 05/25/22 HPI: Patient is a 77 y.o. female with PMH: HTN, chronic HFpEF, IIDM, PAF on Eliquis, tachycradycardia syndrome s/p AV node ablation and PPM, AS s/p TACR. She presented to the hospital on 05/22/2022 with two days of cough, wheezing, SOB and fever. In ED, patient had low grade fever of 100.8, was tachypneic, easily desaturated but stabalized on 4L. CXR showed right lower lobe infiltrates and patient with diffuse wheezing on physical exam. She was made NPO pending SLP swallow evaluation secondary to concern for aspiration. Type of Study: Bedside Swallow Evaluation Previous Swallow Assessment: none found Diet Prior to this Study:  NPO Temperature Spikes Noted: No Respiratory Status: Nasal cannula History of Recent Intubation: No Behavior/Cognition: Alert;Pleasant mood;Cooperative Oral Cavity Assessment: Within Functional Limits Oral Care Completed by SLP: No Oral Cavity - Dentition: Adequate natural dentition Vision: Functional for self-feeding Self-Feeding Abilities: Able to feed self Patient Positioning: Upright in bed Baseline Vocal Quality: Normal Volitional Cough: Strong Volitional Swallow: Able to elicit    Oral/Motor/Sensory Function Overall Oral Motor/Sensory Function: Within functional limits   Ice Chips     Thin Liquid Thin Liquid: Within functional limits Presentation: Straw;Self Fed    Nectar Thick     Honey Thick     Puree Puree: Within functional limits Presentation: Self Fed;Spoon   Solid     Solid: Within functional limits Presentation: Self Fed;Spoon      Angela Nevin, MA, CCC-SLP Speech Therapy

## 2022-05-25 NOTE — Significant Event (Signed)
Rapid Response Event Note   Reason for Call :  Respiratory distress.   Initial Focused Assessment:  Pt lying in bed with eyes open. Her breathing is labored and tachypneic with audible wheezes heard without stethoscope. Pt denies CP but says she is very SOB and her nose is congested.  Lungs wheezy t/o. Skin warm and dry.  T-97.9, HR-64, BP-109/79, RR-40, SpO2-94% on 5L [place].   Interventions:  Duoneb (scheduled order) Humidification added to [place]  solu-medrol IV   lasix IV Cont neb tx.  Plan of Care:  Place pt on CAT. Continue to monitor closely. Call RRT if further assistance needed.   Event Summary:   MD Notified: Dr. Margo Aye notified and came to bedside to assess pt Call Time:0044(notified while on unit) Arrival Time:0044 End Time:0200  Terrilyn Saver, RN

## 2022-05-25 NOTE — Progress Notes (Signed)
Physical Therapy Treatment Patient Details Name: Briana Foster MRN: [id number] DOB: 08/09/1945 Today's Date: 05/25/2022   History of Present Illness 77 y.o. female presents to [facility] on 05/22/2022 with cough, wheezing, SOB, fever. Pt found to have RLL CAP. Pt also with a fall morning of presentation, reports feeling lightheaded prior to fall. PMH includes OA, CAD, depression, HTN, HLD, PAF, RA, DMII.    PT Comments    Pt is up to walk with help and tolerating sidesteps on side of bed with significant fatigue afterward.  Her 5L O2 is needed, if removed even a moment reflects quickly on the telemetry monitor.  Follow up with her is planned for outpatient care but given this set back today may update discharge recommendations next visit.  Follow for acute PT goals as are outlined on evaluation.   Recommendations for follow up therapy are one component of a multi-disciplinary discharge planning process, led by the attending physician.  Recommendations may be updated based on patient status, additional functional criteria and insurance authorization.  Follow Up Recommendations       Assistance Recommended at Discharge Intermittent Supervision/Assistance  Patient can return home with the following A little help with walking and/or transfers;A little help with bathing/dressing/bathroom;Assistance with cooking/housework;Assist for transportation;Help with stairs or ramp for entrance   Equipment Recommendations  None recommended by PT    Recommendations for Other Services       Precautions / Restrictions Precautions Precautions: Fall Precaution Comments: history of vertigo, takes meclizine often at home Restrictions Weight Bearing Restrictions: No     Mobility  Bed Mobility Overal bed mobility: Modified Independent                  Transfers Overall transfer level: Needs assistance Equipment used: Rolling walker (2 wheels), None Transfers: Sit to/from Stand Sit to  Stand: Supervision, Min guard           General transfer comment: supervision at RW, managing lines with PT    Ambulation/Gait Ambulation/Gait assistance: Min guard Gait Distance (Feet): 15 Feet (5' sidestep bouts) Assistive device: Rolling walker (2 wheels) Gait Pattern/deviations: Decreased stride length, Step-to pattern, Shuffle, Wide base of support Gait velocity: reduced Gait velocity interpretation: <1.31 ft/sec, indicative of household ambulator Pre-gait activities: standing balance ck General Gait Details: pt was on side of bed and sidesteps with RW for three short bouts with sats controlled and HR controlled.  BP was initially low but standing was 104/72   Stairs             Wheelchair Mobility    Modified Rankin (Stroke Patients Only)       Balance Overall balance assessment: Needs assistance Sitting-balance support: Feet supported Sitting balance-Leahy Scale: Good     Standing balance support: Bilateral upper extremity supported, During functional activity Standing balance-Leahy Scale: Fair Standing balance comment: less than fair dynamically                            Cognition Arousal/Alertness: Awake/alert Behavior During Therapy: WFL for tasks assessed/performed Overall Cognitive Status: Within Functional Limits for tasks assessed                                 General Comments: discussing her symptoms, realistic about her breathing from AM        Exercises      General Comments General comments (skin  integrity, edema, etc.): Pt was seen for mobility on RW with help to progress with sidesteps.  Pt had controlled BP, HR and sats      Pertinent Vitals/Pain Pain Assessment Pain Assessment: Faces Faces Pain Scale: No hurt    Home Living                          Prior Function            PT Goals (current goals can now be found in the care plan section) Progress towards PT goals: Not progressing  toward goals - comment (Rapid response called this AM)    Frequency    Min 3X/week      PT Plan Current plan remains appropriate    Co-evaluation              AM-PAC PT "6 Clicks" Mobility   Outcome Measure  Help needed turning from your back to your side while in a flat bed without using bedrails?: A Little Help needed moving from lying on your back to sitting on the side of a flat bed without using bedrails?: A Little Help needed moving to and from a bed to a chair (including a wheelchair)?: A Little Help needed standing up from a chair using your arms (e.g., wheelchair or bedside chair)?: A Little Help needed to walk in hospital room?: A Little Help needed climbing 3-5 steps with a railing? : A Little 6 Click Score: 18    End of Session Equipment Utilized During Treatment: Gait belt;Oxygen Activity Tolerance: Patient tolerated treatment well;Treatment limited secondary to medical complications (Comment) Patient left: in bed;with call bell/phone within reach Nurse Communication: Mobility status PT Visit Diagnosis: Other abnormalities of gait and mobility (R26.89);Other symptoms and signs involving the nervous system (R29.898)     Time: 4098-1191 PT Time Calculation (min) (ACUTE ONLY): 25 min  Charges:  $Gait Training: 8-22 mins $Therapeutic Activity: 8-22 mins           Ivar Drape 05/25/2022, 3:31 PM  Samul Dada, PT PhD Acute Rehab Dept. Number: [facility] [id number] and [facility] [phone]

## 2022-05-26 DIAGNOSIS — A419 Sepsis, unspecified organism: Secondary | ICD-10-CM | POA: Diagnosis not present

## 2022-05-26 DIAGNOSIS — R0603 Acute respiratory distress: Secondary | ICD-10-CM | POA: Diagnosis not present

## 2022-05-26 DIAGNOSIS — J189 Pneumonia, unspecified organism: Secondary | ICD-10-CM | POA: Diagnosis not present

## 2022-05-26 DIAGNOSIS — J45901 Unspecified asthma with (acute) exacerbation: Secondary | ICD-10-CM | POA: Diagnosis not present

## 2022-05-26 LAB — GLUCOSE, CAPILLARY
Glucose-Capillary: 263 mg/dL — ABNORMAL HIGH (ref 70–99)
Glucose-Capillary: 251 mg/dL — ABNORMAL HIGH (ref 70–99)
Glucose-Capillary: 233 mg/dL — ABNORMAL HIGH (ref 70–99)
Glucose-Capillary: 226 mg/dL — ABNORMAL HIGH (ref 70–99)
Glucose-Capillary: 241 mg/dL — ABNORMAL HIGH (ref 70–99)

## 2022-05-26 LAB — CBC
HCT: 39.5 % (ref 36.0–46.0)
Hemoglobin: 13.3 g/dL (ref 12.0–15.0)
MCH: 30.9 pg (ref 26.0–34.0)
MCHC: 33.7 g/dL (ref 30.0–36.0)
MCV: 91.9 fL (ref 80.0–100.0)
Platelets: 168 10*3/uL (ref 150–400)
RBC: 4.3 MIL/uL (ref 3.87–5.11)
RDW: 13.3 % (ref 11.5–15.5)
WBC: 11.2 10*3/uL — ABNORMAL HIGH (ref 4.0–10.5)
nRBC: 0 % (ref 0.0–0.2)

## 2022-05-26 LAB — COMPREHENSIVE METABOLIC PANEL
ALT: 40 U/L (ref 0–44)
AST: 43 U/L — ABNORMAL HIGH (ref 15–41)
Albumin: 3.4 g/dL — ABNORMAL LOW (ref 3.5–5.0)
Alkaline Phosphatase: 53 U/L (ref 38–126)
Anion gap: 10 (ref 5–15)
BUN: 31 mg/dL — ABNORMAL HIGH (ref 8–23)
CO2: 25 mmol/L (ref 22–32)
Calcium: 8.8 mg/dL — ABNORMAL LOW (ref 8.9–10.3)
Chloride: 102 mmol/L (ref 98–111)
Creatinine, Ser: 0.93 mg/dL (ref 0.44–1.00)
GFR, Estimated: >60 mL/min (ref >60)
Glucose, Bld: 183 mg/dL — ABNORMAL HIGH (ref 70–99)
Potassium: 4.1 mmol/L (ref 3.5–5.1)
Sodium: 137 mmol/L (ref 135–145)
Total Bilirubin: 1 mg/dL (ref 0.3–1.2)
Total Protein: 6.4 g/dL — ABNORMAL LOW (ref 6.5–8.1)

## 2022-05-26 MED ORDER — INSULIN ASPART 100 UNIT/ML IJ SOLN
0.0000 [IU] | Freq: Three times a day (TID) | INTRAMUSCULAR | Status: DC
  Administered 2022-05-26 – 2022-05-27 (×3): 7 [IU] via SUBCUTANEOUS
  Administered 2022-05-27: 3 [IU] via SUBCUTANEOUS
  Administered 2022-05-27: 7 [IU] via SUBCUTANEOUS
  Administered 2022-05-28: 11 [IU] via SUBCUTANEOUS
  Administered 2022-05-28 – 2022-05-29 (×4): 4 [IU] via SUBCUTANEOUS
  Administered 2022-05-29: 7 [IU] via SUBCUTANEOUS
  Administered 2022-05-29 (×2): 4 [IU] via SUBCUTANEOUS

## 2022-05-26 NOTE — Progress Notes (Signed)
Patient requested to be placed on Purewick throughout the night as she is dyspneic upon exertion and claimed that she is not strong enough to go to the bathroom nor bedside commode.

## 2022-05-26 NOTE — Inpatient Diabetes Management (Signed)
Inpatient Diabetes Program Recommendations  AACE/ADA: New Consensus Statement on Inpatient Glycemic Control (2015)  Target Ranges:  Prepandial:   less than 140 mg/dL      Peak postprandial:   less than 180 mg/dL (1-2 hours)      Critically ill patients:  140 - 180 mg/dL    Latest Reference Range & Units 05/25/22 07:43 05/25/22 11:51 05/25/22 16:11 05/25/22 20:11  Glucose-Capillary 70 - 99 mg/dL 409 (H) 811 (H) 914 (H) 155 (H)  (H): Data is abnormally high  Latest Reference Range & Units 05/26/22 08:04 05/26/22 12:12  Glucose-Capillary 70 - 99 mg/dL 782 (H) 956 (H)  (H): Data is abnormally high       Current Orders: Semglee 15 units Daily      Novolog Resistant Correction Scale/ SSI (0-20 units) TID AC      Novolog 4 units TID with meals    MD- Note pt getting Getting Solumedrol 60 mg BID  Started Semglee and Novolog meal coverage yesterday  Please consider:  1. Increase Semglee to 20 units Daily  2. Increase Novolog Meal Coverage to 6 units TID with meals     --Will follow patient during hospitalization--  Ambrose Finland RN, MSN, CDCES Diabetes Coordinator Inpatient Glycemic Control Team Team Pager: [phone] (8a-5p)

## 2022-05-26 NOTE — Progress Notes (Signed)
Mobility Specialist Progress Note   05/26/22 1600  Mobility  Activity Ambulated with assistance in hallway  Level of Assistance Contact guard assist, steadying assist  Assistive Device Front wheel walker  Distance Ambulated (ft) 120 ft  Activity Response Tolerated well  Mobility Referral Yes  $Mobility charge 1 Mobility   Pre Mobility: 52 HR, 98% SpO2 on 2LO2 During Mobility: 64 HR, 89% - 95% SpO2 on 2LO2 Post Mobility: 66 HR, 96% SpO2 on 2LO2  Received in bed having no complaints and agreeable. Able to stand w/ minG and ambulate w/ CGA. Gait slow but steady, SpO2 ranging from 89% - 95% while on 2LO2  throughout hallway ambulation. Returned back to bed w/o fault, Standby assist to get back in bed, call bell in reach and bed alarm on.   Frederico Hamman Mobility Specialist Please contact via SecureChat or  Rehab office at [phone]

## 2022-05-26 NOTE — Progress Notes (Signed)
Patient has urine output of <40ml throughout the night. Did bladder scan and it is . Andrez Grime, MD informed and ordered for in and out cath. Drained urine.

## 2022-05-26 NOTE — Progress Notes (Signed)
PROGRESS NOTE        PATIENT DETAILS Name: Briana Foster Age: 77 y.o. Sex: female Date of Birth: February 06, 1945 Admit Date: 05/22/2022 Admitting Physician Emeline General, MD ZOX:[id number], Jerelyn Scott, MD  Brief Summary: Patient is a 77 y.o.  female with history of asthma, chronic HFpEF, HTN, permanent atrial fibrillation, tachybradycardia syndrome-s/p PPM implantation, history of aortic stenosis-s/p TAVR-who presented with cough, fever, shortness of breath, and a mechanical fall-patient was found to have RLL PNA and asthma exacerbation.  Significant events: 4/21>> admit to [facility]  Significant studies: 4/21>> CXR: Patchy RLL opacity 4/21>> CT head: No acute intracranial abnormality 4/21>> CT C-spine: No fracture/subluxation 4/21>> X hip: No acute osseous abnormality. 4/24>> CXR: Slightly increased RLL opacity  Significant microbiology data: 4/21>> blood culture: Negative 4/21>> COVID/influenza/RSV: Negative 4/21>> respiratory virus panel: Meta pneumonia virus  Procedures: None  Consults: None  Subjective: No major issues overnight-feels better-less shortness of breath.  She is still bringing up quite a bit of sputum.  Objective: Vitals: Blood pressure (!) 148/73, pulse 63, temperature 98 F (36.7 C), temperature source Oral, resp. rate (!) 21, height  (1.727 m), weight 90.7 kg, SpO2 93 %.   Exam: Gen Exam:Alert awake-not in any distress HEENT:atraumatic, normocephalic Chest: Moving-bilateral rhonchi-right base rales CVS:S1S2 regular Abdomen:soft non tender, non distended Extremities:no edema Neurology: Non focal Skin: no rash  Pertinent Labs/Radiology:    Latest Ref Rng & Units 05/26/2022    3:56 AM 05/25/2022    3:31 AM 05/23/2022    4:24 AM  CBC  WBC 4.0 - 10.5 K/uL 11.2  14.4  6.3   Hemoglobin 12.0 - 15.0 g/dL 40.9  81.1  91.4   Hematocrit 36.0 - 46.0 % 39.5  40.8  36.8   Platelets 150 - 400 K/uL 168  166  139     Lab Results   Component Value Date   NA 137 05/26/2022   K 4.1 05/26/2022   CL 102 05/26/2022   CO2 25 05/26/2022      Assessment/Plan: Acute hypoxic respiratory failure due to RLL PNA and asthma exacerbation Overall much better down to 3 L of oxygen Continue Unasyn/Mucinex/bronchodilators/pulmonary tolerating Continue to encourage mobility/mobilization.   Although respiratory virus panel positive for Meta pneumonia virus-suspect she has developed a superimposed bacterial pneumonia-probably aspiration-although no overt signs of aspiration on SLP eval.    Sepsis secondary to RLL PNA Continue Unasyn/doxycycline Cultures negative so far See above  Acute urine retention Required in/out cath x 1 last night Discussed with nursing staff-continue to monitor/mobilize-hopefully we can avoid a Foley catheter.  Permanent fibrillation Not on any medications for rate control Continue Eliquis  HTN BP stable Amlodipine/Avapro Follow/optimize  DM-2 (A1c 8.1 on 4/22) with uncontrolled hyperglycemia due to steroids CBGs better Continue Semglee 15 units / 4 units of NovoLog with meals/resistant SSI Follow/optimize.    CBG (last 3)  Recent Labs    05/25/22 1611 05/25/22 2011 05/26/22 0804  GLUCAP 176* 155* 263*     Tachybradycardia syndrome-history of PPM implantation-September 2022  History of severe arctic stenosis-s/p TAVR May 2022  RLS Requip  Obesity Estimated body mass index is 30.41 kg/m as calculated from the following:   Height as of this encounter:  (1.727 m).   Weight as of this encounter: 90.7 kg.   Code status:   Code Status: Full Code  DVT Prophylaxis: apixaban (ELIQUIS) tablet 5 mg    Family Communication: Daughter-[phone]-updated over the phone on 4/25.  Disposition Plan: Status is: Inpatient Remains inpatient appropriate because: Severity of illness   Planned Discharge Destination: Home health with SNF-based on clinical  course.   Diet: Diet Order             Diet Heart Room service appropriate? Yes; Fluid consistency: Thin  Diet effective now                     Antimicrobial agents: Anti-infectives (From admission, onward)    Start     Dose/Rate Route Frequency Ordered Stop   05/25/22 0830  Ampicillin-Sulbactam (UNASYN) 3 g in sodium chloride 0.9 % 100 mL IVPB        3 g 200 mL/hr over 30 Minutes Intravenous Every 8 hours 05/25/22 0737     05/24/22 0830  cefTRIAXone (ROCEPHIN) 2 g in sodium chloride 0.9 % 100 mL IVPB  Status:  Discontinued        2 g 200 mL/hr over 30 Minutes Intravenous Every 24 hours 05/24/22 0734 05/25/22 0720   05/23/22 1000  doxycycline (VIBRA-TABS) tablet 100 mg        100 mg Oral Every 12 hours 05/23/22 0956 05/28/22 0959   05/22/22 1115  cefTRIAXone (ROCEPHIN) 2 g in sodium chloride 0.9 % 100 mL IVPB  Status:  Discontinued        2 g 200 mL/hr over 30 Minutes Intravenous Every 24 hours 05/22/22 1105 05/23/22 0955   05/22/22 1115  azithromycin (ZITHROMAX) 500 mg in sodium chloride 0.9 % 250 mL IVPB  Status:  Discontinued        500 mg 250 mL/hr over 60 Minutes Intravenous Every 24 hours 05/22/22 1105 05/23/22 0955        MEDICATIONS: Scheduled Meds:  amLODipine  10 mg Oral Daily   apixaban  5 mg Oral BID   budesonide (PULMICORT) nebulizer solution  2 mg Nebulization Q12H   doxycycline  100 mg Oral Q12H   guaiFENesin  600 mg Oral BID   insulin aspart  0-20 Units Subcutaneous TID WC   insulin aspart  4 Units Subcutaneous TID WC   insulin glargine-yfgn  15 Units Subcutaneous Daily   ipratropium-albuterol  3 mL Nebulization Q4H   irbesartan  300 mg Oral Daily   loratadine  10 mg Oral Daily   methylPREDNISolone (SOLU-MEDROL) injection  60 mg Intravenous BID   pantoprazole  40 mg Oral Daily   rOPINIRole  4 mg Oral Q2000   Continuous Infusions:  albuterol Stopped (05/25/22 0700)   ampicillin-sulbactam (UNASYN) IV 3 g (05/26/22 0847)   PRN  Meds:.acetaminophen, albuterol, alum & mag hydroxide-simeth, benzonatate, meclizine, ondansetron (ZOFRAN) IV   I have personally reviewed following labs and imaging studies  LABORATORY DATA: CBC: Recent Labs  Lab 05/22/22 1110 05/22/22 1120 05/23/22 0424 05/25/22 0331 05/26/22 0356  WBC 8.8  --  6.3 14.4* 11.2*  NEUTROABS 6.5  --   --   --   --   HGB 13.2 13.9 12.4 13.3 13.3  HCT 40.6 41.0 36.8 40.8 39.5  MCV 94.4  --  92.9 94.9 91.9  PLT 156  --  139* 166 168     Basic Metabolic Panel: Recent Labs  Lab 05/22/22 1110 05/22/22 1120 05/23/22 0424 05/25/22 0331 05/26/22 0356  NA 136 138 134* 134* 137  K 3.2* 3.2* 3.8 3.6 4.1  CL 101  --  102 100  102  CO2 20*  --  20* 19* 25  GLUCOSE 207*  --  286* 193* 183*  BUN 11  --  17 29* 31*  CREATININE 0.92  --  0.80 0.98 0.93  CALCIUM 9.3  --  9.3 9.2 8.8*  MG  --   --   --  2.5*  --   PHOS  --   --   --  3.5  --      GFR: Estimated Creatinine Clearance: 62.5 mL/min (by C-G formula based on SCr of 0.93 mg/dL).  Liver Function Tests: Recent Labs  Lab 05/22/22 1110 05/26/22 0356  AST 32 43*  ALT 30 40  ALKPHOS 72 53  BILITOT 1.3* 1.0  PROT 7.1 6.4*  ALBUMIN 4.0 3.4*    No results for input(s): "LIPASE", "AMYLASE" in the last 168 hours. No results for input(s): "AMMONIA" in the last 168 hours.  Coagulation Profile: Recent Labs  Lab 05/22/22 1110  INR 1.3*     Cardiac Enzymes: No results for input(s): "CKTOTAL", "CKMB", "CKMBINDEX", "TROPONINI" in the last 168 hours.  BNP (last 3 results) No results for input(s): "PROBNP" in the last 8760 hours.  Lipid Profile: No results for input(s): "CHOL", "HDL", "LDLCALC", "TRIG", "CHOLHDL", "LDLDIRECT" in the last 72 hours.  Thyroid Function Tests: No results for input(s): "TSH", "T4TOTAL", "FREET4", "T3FREE", "THYROIDAB" in the last 72 hours.   Anemia Panel: Recent Labs    05/24/22 1155  VITAMINB12 553  FOLATE 21.0     Urine analysis:    Component  Value Date/Time   COLORURINE YELLOW 02/01/2022 0344   APPEARANCEUR Clear 02/16/2022 1316   LABSPEC >1.046 (H) 02/01/2022 0344   PHURINE 5.0 02/01/2022 0344   GLUCOSEU Negative 02/16/2022 1316   HGBUR LARGE (A) 02/01/2022 0344   BILIRUBINUR Negative 02/16/2022 1316   KETONESUR NEGATIVE 02/01/2022 0344   PROTEINUR 1+ (A) 02/16/2022 1316   PROTEINUR 100 (A) 02/01/2022 0344   NITRITE Negative 02/16/2022 1316   NITRITE NEGATIVE 02/01/2022 0344   LEUKOCYTESUR Negative 02/16/2022 1316   LEUKOCYTESUR SMALL (A) 02/01/2022 0344    Sepsis Labs: Lactic Acid, Venous    Component Value Date/Time   LATICACIDVEN 2.2 (HH) 05/22/2022 1305    MICROBIOLOGY: Recent Results (from the past 240 hour(s))  Resp panel by RT-PCR (RSV, Flu A&B, Covid) Anterior Nasal Swab     Status: None   Collection Time: 05/22/22 11:05 AM   Specimen: Anterior Nasal Swab  Result Value Ref Range Status   SARS Coronavirus 2 by RT PCR NEGATIVE NEGATIVE Final   Influenza A by PCR NEGATIVE NEGATIVE Final   Influenza B by PCR NEGATIVE NEGATIVE Final    Comment: (NOTE) The Xpert Xpress SARS-CoV-2/FLU/RSV plus assay is intended as an aid in the diagnosis of influenza from Nasopharyngeal swab specimens and should not be used as a sole basis for treatment. Nasal washings and aspirates are unacceptable for Xpert Xpress SARS-CoV-2/FLU/RSV testing.  Fact Sheet for Patients: BloggerCourse.com  Fact Sheet for Healthcare Providers: SeriousBroker.it  This test is not yet approved or cleared by the [place] FDA and has been authorized for detection and/or diagnosis of SARS-CoV-2 by FDA under an Emergency Use Authorization (EUA). This EUA will remain in effect (meaning this test can be used) for the duration of the COVID-19 declaration under Section 564(b)(1) of the Act, 21 U.S.C. section 360bbb-3(b)(1), unless the authorization is terminated or revoked.     Resp  Syncytial Virus by PCR NEGATIVE NEGATIVE Final    Comment: (NOTE)  Fact Sheet for Patients: BloggerCourse.com  Fact Sheet for Healthcare Providers: SeriousBroker.it  This test is not yet approved or cleared by the [place] FDA and has been authorized for detection and/or diagnosis of SARS-CoV-2 by FDA under an Emergency Use Authorization (EUA). This EUA will remain in effect (meaning this test can be used) for the duration of the COVID-19 declaration under Section 564(b)(1) of the Act, 21 U.S.C. section 360bbb-3(b)(1), unless the authorization is terminated or revoked.  Performed at [facility], [address], [place], [place] [address]   Blood Culture (routine x 2)     Status: None (Preliminary result)   Collection Time: 05/22/22 11:10 AM   Specimen: BLOOD  Result Value Ref Range Status   Specimen Description BLOOD RIGHT ANTECUBITAL  Final   Special Requests   Final    BOTTLES DRAWN AEROBIC AND ANAEROBIC Blood Culture results may not be optimal due to an excessive volume of blood received in culture bottles   Culture   Final    NO GROWTH 4 DAYS Performed at [facility], [address], [place], [place] [address]    Report Status PENDING  Incomplete  Blood Culture (routine x 2)     Status: None (Preliminary result)   Collection Time: 05/22/22 11:36 AM   Specimen: BLOOD RIGHT FOREARM  Result Value Ref Range Status   Specimen Description BLOOD RIGHT FOREARM  Final   Special Requests   Final    BOTTLES DRAWN AEROBIC AND ANAEROBIC Blood Culture adequate volume   Culture   Final    NO GROWTH 4 DAYS Performed at [facility], [address], [place], [place] [address]    Report Status PENDING  Incomplete  Respiratory (~20 pathogens) panel by PCR     Status: Abnormal   Collection Time: 05/24/22 12:37 PM   Specimen: Nasopharyngeal Swab; Respiratory  Result Value Ref Range Status   Adenovirus NOT DETECTED  NOT DETECTED Final   Coronavirus 229E NOT DETECTED NOT DETECTED Final    Comment: (NOTE) The Coronavirus on the Respiratory Panel, DOES NOT test for the novel  Coronavirus (2019 nCoV)    Coronavirus HKU1 NOT DETECTED NOT DETECTED Final   Coronavirus NL63 NOT DETECTED NOT DETECTED Final   Coronavirus OC43 NOT DETECTED NOT DETECTED Final   Metapneumovirus DETECTED (A) NOT DETECTED Final   Rhinovirus / Enterovirus NOT DETECTED NOT DETECTED Final   Influenza A NOT DETECTED NOT DETECTED Final   Influenza B NOT DETECTED NOT DETECTED Final   Parainfluenza Virus 1 NOT DETECTED NOT DETECTED Final   Parainfluenza Virus 2 NOT DETECTED NOT DETECTED Final   Parainfluenza Virus 3 NOT DETECTED NOT DETECTED Final   Parainfluenza Virus 4 NOT DETECTED NOT DETECTED Final   Respiratory Syncytial Virus NOT DETECTED NOT DETECTED Final   Bordetella pertussis NOT DETECTED NOT DETECTED Final   Bordetella Parapertussis NOT DETECTED NOT DETECTED Final   Chlamydophila pneumoniae NOT DETECTED NOT DETECTED Final   Mycoplasma pneumoniae NOT DETECTED NOT DETECTED Final    Comment: Performed at [facility], [address] [place], [place] [address]    RADIOLOGY STUDIES/RESULTS: DG CHEST PORT 1 VIEW  Result Date: 05/25/2022 CLINICAL DATA:  Wheezing. EXAM: PORTABLE CHEST 1 VIEW COMPARISON:  05/22/2022. FINDINGS: The heart is enlarged and the mediastinal contour is within normal limits. A TAVR stent is noted. There is atherosclerotic calcification of the aorta. Interstitial prominence is noted bilaterally. Airspace opacities are noted in the mid to lower  right lung, slightly increased from the prior exam. No effusion or pneumothorax. A single lead pacemaker device is present over the left chest. No acute osseous abnormality. IMPRESSION: Airspace opacities in the mid to lower right lung field, slightly increased from the prior exam. Electronically Signed   By: Thornell Sartorius M.D.   On: 05/25/2022 02:27     LOS: 4  days   Jeoffrey Massed, MD  [facility]    To contact the attending provider between 7A-7P or the covering provider during after hours 7P-7A, please log into the web site [email or web address] and access using universal [place] password for that web site. If you do not have the password, please call the hospital operator.  05/26/2022, 10:54 AM

## 2022-05-27 DIAGNOSIS — J45901 Unspecified asthma with (acute) exacerbation: Secondary | ICD-10-CM | POA: Diagnosis not present

## 2022-05-27 DIAGNOSIS — J441 Chronic obstructive pulmonary disease with (acute) exacerbation: Secondary | ICD-10-CM | POA: Diagnosis not present

## 2022-05-27 DIAGNOSIS — J189 Pneumonia, unspecified organism: Secondary | ICD-10-CM | POA: Diagnosis not present

## 2022-05-27 DIAGNOSIS — A419 Sepsis, unspecified organism: Secondary | ICD-10-CM | POA: Diagnosis not present

## 2022-05-27 LAB — CBC
HCT: 38.1 % (ref 36.0–46.0)
Hemoglobin: 12.7 g/dL (ref 12.0–15.0)
MCH: 30.8 pg (ref 26.0–34.0)
MCHC: 33.3 g/dL (ref 30.0–36.0)
MCV: 92.3 fL (ref 80.0–100.0)
Platelets: 157 10*3/uL (ref 150–400)
RBC: 4.13 MIL/uL (ref 3.87–5.11)
RDW: 13.3 % (ref 11.5–15.5)
WBC: 10.7 10*3/uL — ABNORMAL HIGH (ref 4.0–10.5)
nRBC: 0 % (ref 0.0–0.2)

## 2022-05-27 LAB — BASIC METABOLIC PANEL
Anion gap: 8 (ref 5–15)
BUN: 28 mg/dL — ABNORMAL HIGH (ref 8–23)
CO2: 24 mmol/L (ref 22–32)
Calcium: 8.7 mg/dL — ABNORMAL LOW (ref 8.9–10.3)
Chloride: 105 mmol/L (ref 98–111)
Creatinine, Ser: 0.9 mg/dL (ref 0.44–1.00)
GFR, Estimated: >60 mL/min (ref >60)
Glucose, Bld: 156 mg/dL — ABNORMAL HIGH (ref 70–99)
Potassium: 4 mmol/L (ref 3.5–5.1)
Sodium: 137 mmol/L (ref 135–145)

## 2022-05-27 LAB — CULTURE, BLOOD (ROUTINE X 2)
Culture: NO GROWTH
Culture: NO GROWTH
Special Requests: ADEQUATE

## 2022-05-27 LAB — GLUCOSE, CAPILLARY
Glucose-Capillary: 165 mg/dL — ABNORMAL HIGH (ref 70–99)
Glucose-Capillary: 231 mg/dL — ABNORMAL HIGH (ref 70–99)
Glucose-Capillary: 223 mg/dL — ABNORMAL HIGH (ref 70–99)
Glucose-Capillary: 207 mg/dL — ABNORMAL HIGH (ref 70–99)

## 2022-05-27 MED ORDER — INSULIN ASPART 100 UNIT/ML IJ SOLN
6.0000 [IU] | Freq: Three times a day (TID) | INTRAMUSCULAR | Status: DC
  Administered 2022-05-27 – 2022-05-29 (×9): 6 [IU] via SUBCUTANEOUS

## 2022-05-27 MED ORDER — METHYLPREDNISOLONE SODIUM SUCC 40 MG IJ SOLR
40.0000 mg | Freq: Two times a day (BID) | INTRAMUSCULAR | Status: AC
  Administered 2022-05-27: 40 mg via INTRAVENOUS
  Filled 2022-05-27: qty 1

## 2022-05-27 MED ORDER — INSULIN GLARGINE-YFGN 100 UNIT/ML ~~LOC~~ SOLN
20.0000 [IU] | Freq: Every day | SUBCUTANEOUS | Status: DC
  Administered 2022-05-27 – 2022-05-30 (×4): 20 [IU] via SUBCUTANEOUS
  Filled 2022-05-27 (×4): qty 0.2

## 2022-05-27 NOTE — Plan of Care (Signed)
°  Problem: Education: °Goal: Knowledge of disease or condition will improve °Outcome: Progressing °Goal: Knowledge of the prescribed therapeutic regimen will improve °Outcome: Progressing °Goal: Individualized Educational Video(s) °Outcome: Progressing °  °

## 2022-05-27 NOTE — Progress Notes (Signed)
Physical Therapy Treatment Patient Details Name: Briana CASTELLO MRN: [id number] DOB: 1945/02/03 Today's Date: 05/27/2022   History of Present Illness 77 y.o. female presents to [facility] hospital on 05/22/2022 with cough, wheezing, SOB, fever. Pt found to have RLL CAP. Pt also with a fall morning of presentation, reports feeling lightheaded prior to fall. PMH includes OA, CAD, depression, HTN, HLD, PAF, RA, DMII.    PT Comments    Patient resting in bed at start of session and agreeable to therapy and reports complaints at start of fatigue as she did not get much sleep last night. Pt able to complete bed mobility at mod I level and completing transfers at supervision level. Pt VSS throughout and ambulated ~100' with RW in hallway. EOS pt agreeable to sit up in recliner to finish breakfast. Alarm on and call bell within reach. Will continue to progress as able.   Recommendations for follow up therapy are one component of a multi-disciplinary discharge planning process, led by the attending physician.  Recommendations may be updated based on patient status, additional functional criteria and insurance authorization.  Follow Up Recommendations       Assistance Recommended at Discharge Intermittent Supervision/Assistance  Patient can return home with the following A little help with walking and/or transfers;A little help with bathing/dressing/bathroom;Assistance with cooking/housework;Assist for transportation;Help with stairs or ramp for entrance   Equipment Recommendations  None recommended by PT    Recommendations for Other Services       Precautions / Restrictions Precautions Precautions: Fall Precaution Comments: history of vertigo, takes meclizine often at home Restrictions Weight Bearing Restrictions: No     Mobility  Bed Mobility Overal bed mobility: Modified Independent             General bed mobility comments: use of bed features    Transfers Overall transfer level:  Needs assistance Equipment used: Rolling walker (2 wheels), None Transfers: Sit to/from Stand Sit to Stand: Supervision           General transfer comment: supervision for power up from EOB and lower to recliner, pt using UE's to initiate rise and control lower with safe reach back    Ambulation/Gait Ambulation/Gait assistance: Min guard Gait Distance (Feet): 100 Feet Assistive device: Rolling walker (2 wheels) Gait Pattern/deviations: Decreased stride length, Step-to pattern, Shuffle, Wide base of support Gait velocity: reduced     General Gait Details: pt with flexed posture, improved with cues for proximity to RW. no LOB throughout, overall cautious slow gait pattern. VSS with pt on 3L/min and SpO2 91-96% during gait. 97% at rest following ambulation.   Stairs             Wheelchair Mobility    Modified Rankin (Stroke Patients Only)       Balance Overall balance assessment: Needs assistance Sitting-balance support: Feet supported Sitting balance-Leahy Scale: Good     Standing balance support: Bilateral upper extremity supported, During functional activity Standing balance-Leahy Scale: Fair Standing balance comment: less than fair dynamically                            Cognition Arousal/Alertness: Awake/alert Behavior During Therapy: WFL for tasks assessed/performed Overall Cognitive Status: Within Functional Limits for tasks assessed                                 General Comments: reports she is tired, reporting  they woke her at 2AM for lab work        Exercises      General Comments        Pertinent Vitals/Pain Pain Assessment Pain Assessment: No/denies pain Pain Intervention(s): Monitored during session, Repositioned    Home Living                          Prior Function            PT Goals (current goals can now be found in the care plan section) Acute Rehab PT Goals PT Goal Formulation: With  patient Time For Goal Achievement: 06/06/22 Potential to Achieve Goals: Fair Progress towards PT goals: Progressing toward goals    Frequency    Min 3X/week      PT Plan Current plan remains appropriate    Co-evaluation              AM-PAC PT "6 Clicks" Mobility   Outcome Measure  Help needed turning from your back to your side while in a flat bed without using bedrails?: A Little Help needed moving from lying on your back to sitting on the side of a flat bed without using bedrails?: A Little Help needed moving to and from a bed to a chair (including a wheelchair)?: A Little Help needed standing up from a chair using your arms (e.g., wheelchair or bedside chair)?: A Little Help needed to walk in hospital room?: A Little Help needed climbing 3-5 steps with a railing? : A Little 6 Click Score: 18    End of Session Equipment Utilized During Treatment: Gait belt;Oxygen Activity Tolerance: Patient tolerated treatment well;Treatment limited secondary to medical complications (Comment) Patient left: in bed;with call bell/phone within reach Nurse Communication: Mobility status PT Visit Diagnosis: Other abnormalities of gait and mobility (R26.89);Other symptoms and signs involving the nervous system (R29.898)     Time: 1610-9604 PT Time Calculation (min) (ACUTE ONLY): 24 min  Charges:  $Gait Training: 8-22 mins $Therapeutic Activity: 8-22 mins                     Wynn Maudlin, DPT Acute Rehabilitation Services Office [phone]  05/27/22 12:58 PM

## 2022-05-27 NOTE — Progress Notes (Signed)
PROGRESS NOTE        PATIENT DETAILS Name: Briana Foster Age: 77 y.o. Sex: female Date of Birth: Jan 10, 1946 Admit Date: 05/22/2022 Admitting Physician Emeline General, MD ZOX:[id number], Jerelyn Scott, MD  Brief Summary: Patient is a 77 y.o.  female with history of asthma, chronic HFpEF, HTN, permanent atrial fibrillation, tachybradycardia syndrome-s/p PPM implantation, history of aortic stenosis-s/p TAVR-who presented with cough, fever, shortness of breath, and a mechanical fall-patient was found to have RLL PNA and asthma exacerbation.  Significant events: 4/21>> admit to [facility]  Significant studies: 4/21>> CXR: Patchy RLL opacity 4/21>> CT head: No acute intracranial abnormality 4/21>> CT C-spine: No fracture/subluxation 4/21>> X hip: No acute osseous abnormality. 4/24>> CXR: Slightly increased RLL opacity  Significant microbiology data: 4/21>> blood culture: Negative 4/21>> COVID/influenza/RSV: Negative 4/21>> respiratory virus panel: Meta pneumonia virus  Procedures: None  Consults: None  Subjective: Feels better-still coughing up quite a bit of phlegm-but overall much better-on 2-3 L of oxygen this morning.  Objective: Vitals: Blood pressure (!) 151/72, pulse 63, temperature 97.8 F (36.6 C), temperature source Oral, resp. rate 20, height 5\' 8"  (1.727 m), weight 90.7 kg, SpO2 96 %.   Exam: Gen Exam:Alert awake-not in any distress HEENT:atraumatic, normocephalic Chest: Moving air well-only few scattered rhonchi today. CVS:S1S2 regular Abdomen:soft non tender, non distended Extremities:no edema Neurology: Non focal Skin: no rash  Pertinent Labs/Radiology:    Latest Ref Rng & Units 05/27/2022    5:42 AM 05/26/2022    3:56 AM 05/25/2022    3:31 AM  CBC  WBC 4.0 - 10.5 K/uL 10.7  11.2  14.4   Hemoglobin 12.0 - 15.0 g/dL 40.9  81.1  91.4   Hematocrit 36.0 - 46.0 % 38.1  39.5  40.8   Platelets 150 - 400 K/uL 157  168  166     Lab Results   Component Value Date   NA 137 05/27/2022   K 4.0 05/27/2022   CL 105 05/27/2022   CO2 24 05/27/2022      Assessment/Plan: Acute hypoxic respiratory failure due to RLL PNA and asthma exacerbation Improving-down to 2-2 L of oxygen Continue Unasyn/Mucinex/bronchodilators/pulmonary toileting Start decreasing steroids Continue to encourage mobility/mobilization Although respiratory virus panel positive for Meta pneumonia virus-suspect she has developed a superimposed bacterial pneumonia-probably aspiration-although no overt signs of aspiration on SLP eval.    Sepsis secondary to RLL PNA Continue Unasyn x 7 days total of antibiotics Stop doxycycline-has completed 5 days Cultures negative so far See above.  Acute urine retention Required in/out cath x 1 on 4/24 Thankfully voiding spontaneously-watch closely.  Permanent fibrillation Not on any medications for rate control Continue Eliquis  HTN BP stable Amlodipine/Avapro Follow/optimize  DM-2 (A1c 8.1 on 4/22) with uncontrolled hyperglycemia due to steroids CBGs better Continue Semglee 15 units / 4 units of NovoLog with meals/resistant SSI Follow/optimize.    CBG (last 3)  Recent Labs    05/26/22 2108 05/26/22 2150 05/27/22 0752  GLUCAP 226* 241* 165*      Tachybradycardia syndrome-history of PPM implantation-September 2022  History of severe arctic stenosis-s/p TAVR May 2022  RLS Requip  Obesity Estimated body mass index is 30.41 kg/m as calculated from the following:   Height as of this encounter: 5\' 8"  (1.727 m).   Weight as of this encounter: 90.7 kg.   Code status:   Code Status: Full  Code   DVT Prophylaxis: apixaban (ELIQUIS) tablet 5 mg    Family Communication: Daughter-Michelle-[phone]-updated over the phone on 4/25.  Disposition Plan: Status is: Inpatient Remains inpatient appropriate because: Severity of illness   Planned Discharge Destination: Home health with SNF-based on clinical  course.   Diet: Diet Order             Diet Heart Room service appropriate? Yes; Fluid consistency: Thin  Diet effective now                     Antimicrobial agents: Anti-infectives (From admission, onward)    Start     Dose/Rate Route Frequency Ordered Stop   05/25/22 0830  Ampicillin-Sulbactam (UNASYN) 3 g in sodium chloride 0.9 % 100 mL IVPB        3 g 200 mL/hr over 30 Minutes Intravenous Every 8 hours 05/25/22 0737     05/24/22 0830  cefTRIAXone (ROCEPHIN) 2 g in sodium chloride 0.9 % 100 mL IVPB  Status:  Discontinued        2 g 200 mL/hr over 30 Minutes Intravenous Every 24 hours 05/24/22 0734 05/25/22 0720   05/23/22 1000  doxycycline (VIBRA-TABS) tablet 100 mg        100 mg Oral Every 12 hours 05/23/22 0956 05/28/22 0959   05/22/22 1115  cefTRIAXone (ROCEPHIN) 2 g in sodium chloride 0.9 % 100 mL IVPB  Status:  Discontinued        2 g 200 mL/hr over 30 Minutes Intravenous Every 24 hours 05/22/22 1105 05/23/22 0955   05/22/22 1115  azithromycin (ZITHROMAX) 500 mg in sodium chloride 0.9 % 250 mL IVPB  Status:  Discontinued        500 mg 250 mL/hr over 60 Minutes Intravenous Every 24 hours 05/22/22 1105 05/23/22 0955        MEDICATIONS: Scheduled Meds:  amLODipine  10 mg Oral Daily   apixaban  5 mg Oral BID   budesonide (PULMICORT) nebulizer solution  2 mg Nebulization Q12H   doxycycline  100 mg Oral Q12H   guaiFENesin  600 mg Oral BID   insulin aspart  0-20 Units Subcutaneous TID AC & HS   insulin aspart  6 Units Subcutaneous TID WC   insulin glargine-yfgn  20 Units Subcutaneous Daily   ipratropium-albuterol  3 mL Nebulization Q4H   irbesartan  300 mg Oral Daily   loratadine  10 mg Oral Daily   methylPREDNISolone (SOLU-MEDROL) injection  60 mg Intravenous BID   pantoprazole  40 mg Oral Daily   rOPINIRole  4 mg Oral Q2000   Continuous Infusions:  albuterol Stopped (05/25/22 0700)   ampicillin-sulbactam (UNASYN) IV 3 g (05/27/22 0902)   PRN  Meds:.acetaminophen, albuterol, alum & mag hydroxide-simeth, benzonatate, meclizine, ondansetron (ZOFRAN) IV   I have personally reviewed following labs and imaging studies  LABORATORY DATA: CBC: Recent Labs  Lab 05/22/22 1110 05/22/22 1120 05/23/22 0424 05/25/22 0331 05/26/22 0356 05/27/22 0542  WBC 8.8  --  6.3 14.4* 11.2* 10.7*  NEUTROABS 6.5  --   --   --   --   --   HGB 13.2 13.9 12.4 13.3 13.3 12.7  HCT 40.6 41.0 36.8 40.8 39.5 38.1  MCV 94.4  --  92.9 94.9 91.9 92.3  PLT 156  --  139* 166 168 157     Basic Metabolic Panel: Recent Labs  Lab 05/22/22 1110 05/22/22 1120 05/23/22 0424 05/25/22 0331 05/26/22 0356 05/27/22 0542  NA 136 138 134*  134* 137 137  K 3.2* 3.2* 3.8 3.6 4.1 4.0  CL 101  --  102 100 102 105  CO2 20*  --  20* 19* 25 24  GLUCOSE 207*  --  286* 193* 183* 156*  BUN 11  --  17 29* 31* 28*  CREATININE 0.92  --  0.80 0.98 0.93 0.90  CALCIUM 9.3  --  9.3 9.2 8.8* 8.7*  MG  --   --   --  2.5*  --   --   PHOS  --   --   --  3.5  --   --      GFR: Estimated Creatinine Clearance: 64.6 mL/min (by C-G formula based on SCr of 0.9 mg/dL).  Liver Function Tests: Recent Labs  Lab 05/22/22 1110 05/26/22 0356  AST 32 43*  ALT 30 40  ALKPHOS 72 53  BILITOT 1.3* 1.0  PROT 7.1 6.4*  ALBUMIN 4.0 3.4*    No results for input(s): "LIPASE", "AMYLASE" in the last 168 hours. No results for input(s): "AMMONIA" in the last 168 hours.  Coagulation Profile: Recent Labs  Lab 05/22/22 1110  INR 1.3*     Cardiac Enzymes: No results for input(s): "CKTOTAL", "CKMB", "CKMBINDEX", "TROPONINI" in the last 168 hours.  BNP (last 3 results) No results for input(s): "PROBNP" in the last 8760 hours.  Lipid Profile: No results for input(s): "CHOL", "HDL", "LDLCALC", "TRIG", "CHOLHDL", "LDLDIRECT" in the last 72 hours.  Thyroid Function Tests: No results for input(s): "TSH", "T4TOTAL", "FREET4", "T3FREE", "THYROIDAB" in the last 72 hours.   Anemia  Panel: Recent Labs    05/24/22 1155  VITAMINB12 553  FOLATE 21.0     Urine analysis:    Component Value Date/Time   COLORURINE YELLOW 02/01/2022 0344   APPEARANCEUR Clear 02/16/2022 1316   LABSPEC >1.046 (H) 02/01/2022 0344   PHURINE 5.0 02/01/2022 0344   GLUCOSEU Negative 02/16/2022 1316   HGBUR LARGE (A) 02/01/2022 0344   BILIRUBINUR Negative 02/16/2022 1316   KETONESUR NEGATIVE 02/01/2022 0344   PROTEINUR 1+ (A) 02/16/2022 1316   PROTEINUR 100 (A) 02/01/2022 0344   NITRITE Negative 02/16/2022 1316   NITRITE NEGATIVE 02/01/2022 0344   LEUKOCYTESUR Negative 02/16/2022 1316   LEUKOCYTESUR SMALL (A) 02/01/2022 0344    Sepsis Labs: Lactic Acid, Venous    Component Value Date/Time   LATICACIDVEN 2.2 (HH) 05/22/2022 1305    MICROBIOLOGY: Recent Results (from the past 240 hour(s))  Resp panel by RT-PCR (RSV, Flu A&B, Covid) Anterior Nasal Swab     Status: None   Collection Time: 05/22/22 11:05 AM   Specimen: Anterior Nasal Swab  Result Value Ref Range Status   SARS Coronavirus 2 by RT PCR NEGATIVE NEGATIVE Final   Influenza A by PCR NEGATIVE NEGATIVE Final   Influenza B by PCR NEGATIVE NEGATIVE Final    Comment: (NOTE) The Xpert Xpress SARS-CoV-2/FLU/RSV plus assay is intended as an aid in the diagnosis of influenza from Nasopharyngeal swab specimens and should not be used as a sole basis for treatment. Nasal washings and aspirates are unacceptable for Xpert Xpress SARS-CoV-2/FLU/RSV testing.  Fact Sheet for Patients: BloggerCourse.com  Fact Sheet for Healthcare Providers: SeriousBroker.it  This test is not yet approved or cleared by the [place] FDA and has been authorized for detection and/or diagnosis of SARS-CoV-2 by FDA under an Emergency Use Authorization (EUA). This EUA will remain in effect (meaning this test can be used) for the duration of the COVID-19 declaration under Section 564(b)(1) of  the  Act, 21 U.S.C. section 360bbb-3(b)(1), unless the authorization is terminated or revoked.     Resp Syncytial Virus by PCR NEGATIVE NEGATIVE Final    Comment: (NOTE) Fact Sheet for Patients: BloggerCourse.com  Fact Sheet for Healthcare Providers: SeriousBroker.it  This test is not yet approved or cleared by the [place] FDA and has been authorized for detection and/or diagnosis of SARS-CoV-2 by FDA under an Emergency Use Authorization (EUA). This EUA will remain in effect (meaning this test can be used) for the duration of the COVID-19 declaration under Section 564(b)(1) of the Act, 21 U.S.C. section 360bbb-3(b)(1), unless the authorization is terminated or revoked.  Performed at [facility], [address], [place], [place] [address]   Blood Culture (routine x 2)     Status: None   Collection Time: 05/22/22 11:10 AM   Specimen: BLOOD  Result Value Ref Range Status   Specimen Description BLOOD RIGHT ANTECUBITAL  Final   Special Requests   Final    BOTTLES DRAWN AEROBIC AND ANAEROBIC Blood Culture results may not be optimal due to an excessive volume of blood received in culture bottles   Culture   Final    NO GROWTH 5 DAYS Performed at [facility], [address] [place], [place] [address]    Report Status 05/27/2022 FINAL  Final  Blood Culture (routine x 2)     Status: None   Collection Time: 05/22/22 11:36 AM   Specimen: BLOOD RIGHT FOREARM  Result Value Ref Range Status   Specimen Description BLOOD RIGHT FOREARM  Final   Special Requests   Final    BOTTLES DRAWN AEROBIC AND ANAEROBIC Blood Culture adequate volume   Culture   Final    NO GROWTH 5 DAYS Performed at [facility], [address], [place], [place] [address]    Report Status 05/27/2022 FINAL  Final  Respiratory (~20 pathogens) panel by PCR     Status: Abnormal   Collection Time: 05/24/22 12:37 PM   Specimen: Nasopharyngeal Swab;  Respiratory  Result Value Ref Range Status   Adenovirus NOT DETECTED NOT DETECTED Final   Coronavirus 229E NOT DETECTED NOT DETECTED Final    Comment: (NOTE) The Coronavirus on the Respiratory Panel, DOES NOT test for the novel  Coronavirus (2019 nCoV)    Coronavirus HKU1 NOT DETECTED NOT DETECTED Final   Coronavirus NL63 NOT DETECTED NOT DETECTED Final   Coronavirus OC43 NOT DETECTED NOT DETECTED Final   Metapneumovirus DETECTED (A) NOT DETECTED Final   Rhinovirus / Enterovirus NOT DETECTED NOT DETECTED Final   Influenza A NOT DETECTED NOT DETECTED Final   Influenza B NOT DETECTED NOT DETECTED Final   Parainfluenza Virus 1 NOT DETECTED NOT DETECTED Final   Parainfluenza Virus 2 NOT DETECTED NOT DETECTED Final   Parainfluenza Virus 3 NOT DETECTED NOT DETECTED Final   Parainfluenza Virus 4 NOT DETECTED NOT DETECTED Final   Respiratory Syncytial Virus NOT DETECTED NOT DETECTED Final   Bordetella pertussis NOT DETECTED NOT DETECTED Final   Bordetella Parapertussis NOT DETECTED NOT DETECTED Final   Chlamydophila pneumoniae NOT DETECTED NOT DETECTED Final   Mycoplasma pneumoniae NOT DETECTED NOT DETECTED Final    Comment: Performed at [facility], [address], [place], [place] [address]    RADIOLOGY STUDIES/RESULTS: No results found.   LOS: 5 days   Jeoffrey Massed, MD  [facility]    To contact the attending provider between 7A-7P or the covering provider during  after hours 7P-7A, please log into the web site [email or web address] and access using universal [place] password for that web site. If you do not have the password, please call the hospital operator.  05/27/2022, 10:48 AM

## 2022-05-28 ENCOUNTER — Inpatient Hospital Stay (HOSPITAL_COMMUNITY): Payer: Medicare HMO

## 2022-05-28 DIAGNOSIS — R0603 Acute respiratory distress: Secondary | ICD-10-CM | POA: Diagnosis not present

## 2022-05-28 DIAGNOSIS — J45901 Unspecified asthma with (acute) exacerbation: Secondary | ICD-10-CM | POA: Diagnosis not present

## 2022-05-28 DIAGNOSIS — J189 Pneumonia, unspecified organism: Secondary | ICD-10-CM | POA: Diagnosis not present

## 2022-05-28 DIAGNOSIS — A419 Sepsis, unspecified organism: Secondary | ICD-10-CM | POA: Diagnosis not present

## 2022-05-28 LAB — GLUCOSE, CAPILLARY
Glucose-Capillary: 181 mg/dL — ABNORMAL HIGH (ref 70–99)
Glucose-Capillary: 208 mg/dL — ABNORMAL HIGH (ref 70–99)
Glucose-Capillary: 275 mg/dL — ABNORMAL HIGH (ref 70–99)
Glucose-Capillary: 198 mg/dL — ABNORMAL HIGH (ref 70–99)
Glucose-Capillary: 151 mg/dL — ABNORMAL HIGH (ref 70–99)

## 2022-05-28 LAB — CBC
HCT: 38.2 % (ref 36.0–46.0)
Hemoglobin: 12.8 g/dL (ref 12.0–15.0)
MCH: 31.1 pg (ref 26.0–34.0)
MCHC: 33.5 g/dL (ref 30.0–36.0)
MCV: 92.7 fL (ref 80.0–100.0)
Platelets: 162 10*3/uL (ref 150–400)
RBC: 4.12 MIL/uL (ref 3.87–5.11)
RDW: 13.2 % (ref 11.5–15.5)
WBC: 10 10*3/uL (ref 4.0–10.5)
nRBC: 0 % (ref 0.0–0.2)

## 2022-05-28 MED ORDER — FLUTICASONE PROPIONATE 50 MCG/ACT NA SUSP
2.0000 | Freq: Every day | NASAL | Status: DC
  Administered 2022-05-28 – 2022-05-29 (×2): 2 via NASAL
  Filled 2022-05-28: qty 16

## 2022-05-28 MED ORDER — METHYLPREDNISOLONE SODIUM SUCC 40 MG IJ SOLR
40.0000 mg | Freq: Two times a day (BID) | INTRAMUSCULAR | Status: DC
  Administered 2022-05-28 – 2022-05-29 (×3): 40 mg via INTRAVENOUS
  Filled 2022-05-28 (×3): qty 1

## 2022-05-28 MED ORDER — ARFORMOTEROL TARTRATE 15 MCG/2ML IN NEBU
15.0000 ug | INHALATION_SOLUTION | Freq: Two times a day (BID) | RESPIRATORY_TRACT | Status: DC
  Administered 2022-05-28 – 2022-05-30 (×5): 15 ug via RESPIRATORY_TRACT
  Filled 2022-05-28 (×5): qty 2

## 2022-05-28 MED ORDER — IPRATROPIUM-ALBUTEROL 0.5-2.5 (3) MG/3ML IN SOLN
3.0000 mL | Freq: Four times a day (QID) | RESPIRATORY_TRACT | Status: DC
  Administered 2022-05-28 – 2022-05-30 (×6): 3 mL via RESPIRATORY_TRACT
  Filled 2022-05-28: qty 6
  Filled 2022-05-28: qty 3
  Filled 2022-05-28: qty 9
  Filled 2022-05-28 (×3): qty 3

## 2022-05-28 MED ORDER — FUROSEMIDE 10 MG/ML IJ SOLN
40.0000 mg | Freq: Once | INTRAMUSCULAR | Status: AC
  Administered 2022-05-28: 40 mg via INTRAVENOUS
  Filled 2022-05-28: qty 4

## 2022-05-28 NOTE — Progress Notes (Signed)
Mobility Specialist - Progress Note   05/28/22 1208  Mobility  Activity Refused mobility   Pt refused mobility at this time. No specific reason given. Will continue to follow.   Alda Lea  Mobility Specialist Please contact via Special educational needs teacher or Rehab office at [phone]

## 2022-05-28 NOTE — Progress Notes (Signed)
Pharmacy Antibiotic Note- follow-up  Briana Foster is a 77 y.o. female admitted on 05/22/2022 with pneumonia.  Pharmacy has been consulted for Unasyn dosing. WBC 6.3>>14.4. Renal function ok.   Plan: Continue Unasyn 3g IV q8h  Height: 5\' 8"  (172.7 cm) Weight: 90.7 kg (200 lb) IBW/kg (Calculated) : 63.9  Temp (24hrs), Avg:98 F (36.7 C), Min:97.7 F (36.5 C), Max:98.5 F (36.9 C)  Recent Labs  Lab 05/22/22 1110 05/22/22 1305 05/23/22 0424 05/25/22 0331 05/26/22 0356 05/27/22 0542 05/28/22 0251  WBC 8.8  --  6.3 14.4* 11.2* 10.7* 10.0  CREATININE 0.92  --  0.80 0.98 0.93 0.90  --   LATICACIDVEN 2.9* 2.2*  --   --   --   --   --      Estimated Creatinine Clearance: 64.6 mL/min (by C-G formula based on SCr of 0.9 mg/dL).    Allergies  Allergen Reactions   Bee Venom Shortness Of Breath and Rash   Claritin [Loratadine] Itching and Palpitations   Wasp Venom Other (See Comments) and Anaphylaxis   Apricot Flavor Swelling    Eyes swell shut   Atorvastatin Itching    Eye swelling Other reaction(s): HA   Losartan Itching and Rash    Jibran Crookshanks BS, PharmD, BCPS Clinical Pharmacist 05/28/2022 8:10 AM  Contact: [phone] after 3 PM  "Be curious, not judgmental..." -Debbora Dus

## 2022-05-28 NOTE — Progress Notes (Signed)
PROGRESS NOTE        PATIENT DETAILS Name: Briana Foster Age: 77 y.o. Sex: female Date of Birth: November 10, 1945 Admit Date: 05/22/2022 Admitting Physician Emeline General, MD WUJ:[id number], Jerelyn Scott, MD  Brief Summary: Patient is a 77 y.o.  female with history of asthma, chronic HFpEF, HTN, permanent atrial fibrillation, tachybradycardia syndrome-s/p PPM implantation, history of aortic stenosis-s/p TAVR-who presented with cough, fever, shortness of breath, and a mechanical fall-patient was found to have RLL PNA and asthma exacerbation.  Significant events: 4/21>> admit to [facility]  Significant studies: 4/21>> CXR: Patchy RLL opacity 4/21>> CT head: No acute intracranial abnormality 4/21>> CT C-spine: No fracture/subluxation 4/21>> X hip: No acute osseous abnormality. 4/24>> CXR: Slightly increased RLL opacity 4/27>> CXR: Improved RLL opacity  Significant microbiology data: 4/21>> blood culture: Negative 4/21>> COVID/influenza/RSV: Negative 4/21>> respiratory virus panel: Meta pneumonia virus  Procedures: None  Consults: None  Subjective: Does not feel as good as yesterday.  Stable on 2-3 L of oxygen.  Continues to cough.  Objective: Vitals: Blood pressure (!) 149/67, pulse 63, temperature (!) 97.5 F (36.4 C), temperature source Oral, resp. rate (!) 21, height 5\' 8"  (1.727 m), weight 90.7 kg, SpO2 90 %.   Exam: Gen Exam:Alert awake-not in any distress HEENT:atraumatic, normocephalic Chest: Some scattered rhonchi-mostly rales in the right lung base. CVS:S1S2 regular Abdomen:soft non tender, non distended Extremities:no edema Neurology: Non focal Skin: no rash  Pertinent Labs/Radiology:    Latest Ref Rng & Units 05/28/2022    2:51 AM 05/27/2022    5:42 AM 05/26/2022    3:56 AM  CBC  WBC 4.0 - 10.5 K/uL 10.0  10.7  11.2   Hemoglobin 12.0 - 15.0 g/dL 78.2  95.6  21.3   Hematocrit 36.0 - 46.0 % 38.2  38.1  39.5   Platelets 150 - 400 K/uL 162  157   168     Lab Results  Component Value Date   NA 137 05/27/2022   K 4.0 05/27/2022   CL 105 05/27/2022   CO2 24 05/27/2022      Assessment/Plan: Acute hypoxic respiratory failure due to RLL PNA and asthma exacerbation Overall better-stable on 2-3 L of oxygen-continues to have significant issues with coughing Continue IV steroids for 1 additional day-continue IV Unasyn Continue antitussives Encourage mobility/pulmonary toileting is much as possible.   Although no signs of fluid overload-will give 1 dose of IV Lasix to maintain negative balance. Although respiratory virus panel positive for Meta pneumonia virus-suspect she has developed a superimposed bacterial pneumonia-probably aspiration-although no overt signs of aspiration on SLP eval.    Sepsis secondary to RLL PNA Continue Unasyn x 7 days total of antibiotics Cultures negative so far See above.  Acute urine retention Required in/out cath x 1 on 4/24 Thankfully voiding spontaneously-watch closely.  Permanent fibrillation Not on any medications for rate control Continue Eliquis  HTN BP stable Amlodipine/Avapro Follow/optimize  DM-2 (A1c 8.1 on 4/22) with uncontrolled hyperglycemia due to steroids CBGs better Continue Semglee 15 units / 4 units of NovoLog with meals/resistant SSI Follow/optimize.    CBG (last 3)  Recent Labs    05/27/22 2040 05/28/22 0640 05/28/22 0815  GLUCAP 207* 181* 208*      Tachybradycardia syndrome-history of PPM implantation-September 2022  History of severe arctic stenosis-s/p TAVR May 2022  RLS Requip  Obesity Estimated body mass index  is 30.41 kg/m as calculated from the following:   Height as of this encounter: 5\' 8"  (1.727 m).   Weight as of this encounter: 90.7 kg.   Code status:   Code Status: Full Code   DVT Prophylaxis: apixaban (ELIQUIS) tablet 5 mg    Family Communication: Daughter-Michelle-[phone]-updated over the phone on 4/27.  Disposition  Plan: Status is: Inpatient Remains inpatient appropriate because: Severity of illness   Planned Discharge Destination: Home health with SNF-based on clinical course.   Diet: Diet Order             Diet Heart Room service appropriate? Yes; Fluid consistency: Thin  Diet effective now                     Antimicrobial agents: Anti-infectives (From admission, onward)    Start     Dose/Rate Route Frequency Ordered Stop   05/25/22 0830  Ampicillin-Sulbactam (UNASYN) 3 g in sodium chloride 0.9 % 100 mL IVPB        3 g 200 mL/hr over 30 Minutes Intravenous Every 8 hours 05/25/22 0737     05/24/22 0830  cefTRIAXone (ROCEPHIN) 2 g in sodium chloride 0.9 % 100 mL IVPB  Status:  Discontinued        2 g 200 mL/hr over 30 Minutes Intravenous Every 24 hours 05/24/22 0734 05/25/22 0720   05/23/22 1000  doxycycline (VIBRA-TABS) tablet 100 mg  Status:  Discontinued        100 mg Oral Every 12 hours 05/23/22 0956 05/27/22 1053   05/22/22 1115  cefTRIAXone (ROCEPHIN) 2 g in sodium chloride 0.9 % 100 mL IVPB  Status:  Discontinued        2 g 200 mL/hr over 30 Minutes Intravenous Every 24 hours 05/22/22 1105 05/23/22 0955   05/22/22 1115  azithromycin (ZITHROMAX) 500 mg in sodium chloride 0.9 % 250 mL IVPB  Status:  Discontinued        500 mg 250 mL/hr over 60 Minutes Intravenous Every 24 hours 05/22/22 1105 05/23/22 0955        MEDICATIONS: Scheduled Meds:  amLODipine  10 mg Oral Daily   apixaban  5 mg Oral BID   arformoterol  15 mcg Nebulization BID   budesonide (PULMICORT) nebulizer solution  2 mg Nebulization Q12H   fluticasone  2 spray Each Nare Daily   guaiFENesin  600 mg Oral BID   insulin aspart  0-20 Units Subcutaneous TID AC & HS   insulin aspart  6 Units Subcutaneous TID WC   insulin glargine-yfgn  20 Units Subcutaneous Daily   ipratropium-albuterol  3 mL Nebulization Q6H   irbesartan  300 mg Oral Daily   loratadine  10 mg Oral Daily   methylPREDNISolone (SOLU-MEDROL)  injection  40 mg Intravenous Q12H   pantoprazole  40 mg Oral Daily   rOPINIRole  4 mg Oral Q2000   Continuous Infusions:  albuterol Stopped (05/25/22 0700)   ampicillin-sulbactam (UNASYN) IV 3 g (05/28/22 0838)   PRN Meds:.acetaminophen, albuterol, alum & mag hydroxide-simeth, benzonatate, meclizine, ondansetron (ZOFRAN) IV   I have personally reviewed following labs and imaging studies  LABORATORY DATA: CBC: Recent Labs  Lab 05/22/22 1110 05/22/22 1120 05/23/22 0424 05/25/22 0331 05/26/22 0356 05/27/22 0542 05/28/22 0251  WBC 8.8  --  6.3 14.4* 11.2* 10.7* 10.0  NEUTROABS 6.5  --   --   --   --   --   --   HGB 13.2   < > 12.4  13.3 13.3 12.7 12.8  HCT 40.6   < > 36.8 40.8 39.5 38.1 38.2  MCV 94.4  --  92.9 94.9 91.9 92.3 92.7  PLT 156  --  139* 166 168 157 162   < > = values in this interval not displayed.     Basic Metabolic Panel: Recent Labs  Lab 05/22/22 1110 05/22/22 1120 05/23/22 0424 05/25/22 0331 05/26/22 0356 05/27/22 0542  NA 136 138 134* 134* 137 137  K 3.2* 3.2* 3.8 3.6 4.1 4.0  CL 101  --  102 100 102 105  CO2 20*  --  20* 19* 25 24  GLUCOSE 207*  --  286* 193* 183* 156*  BUN 11  --  17 29* 31* 28*  CREATININE 0.92  --  0.80 0.98 0.93 0.90  CALCIUM 9.3  --  9.3 9.2 8.8* 8.7*  MG  --   --   --  2.5*  --   --   PHOS  --   --   --  3.5  --   --      GFR: Estimated Creatinine Clearance: 64.6 mL/min (by C-G formula based on SCr of 0.9 mg/dL).  Liver Function Tests: Recent Labs  Lab 05/22/22 1110 05/26/22 0356  AST 32 43*  ALT 30 40  ALKPHOS 72 53  BILITOT 1.3* 1.0  PROT 7.1 6.4*  ALBUMIN 4.0 3.4*    No results for input(s): "LIPASE", "AMYLASE" in the last 168 hours. No results for input(s): "AMMONIA" in the last 168 hours.  Coagulation Profile: Recent Labs  Lab 05/22/22 1110  INR 1.3*     Cardiac Enzymes: No results for input(s): "CKTOTAL", "CKMB", "CKMBINDEX", "TROPONINI" in the last 168 hours.  BNP (last 3 results) No  results for input(s): "PROBNP" in the last 8760 hours.  Lipid Profile: No results for input(s): "CHOL", "HDL", "LDLCALC", "TRIG", "CHOLHDL", "LDLDIRECT" in the last 72 hours.  Thyroid Function Tests: No results for input(s): "TSH", "T4TOTAL", "FREET4", "T3FREE", "THYROIDAB" in the last 72 hours.   Anemia Panel: No results for input(s): "VITAMINB12", "FOLATE", "FERRITIN", "TIBC", "IRON", "RETICCTPCT" in the last 72 hours.   Urine analysis:    Component Value Date/Time   COLORURINE YELLOW 02/01/2022 0344   APPEARANCEUR Clear 02/16/2022 1316   LABSPEC >1.046 (H) 02/01/2022 0344   PHURINE 5.0 02/01/2022 0344   GLUCOSEU Negative 02/16/2022 1316   HGBUR LARGE (A) 02/01/2022 0344   BILIRUBINUR Negative 02/16/2022 1316   KETONESUR NEGATIVE 02/01/2022 0344   PROTEINUR 1+ (A) 02/16/2022 1316   PROTEINUR 100 (A) 02/01/2022 0344   NITRITE Negative 02/16/2022 1316   NITRITE NEGATIVE 02/01/2022 0344   LEUKOCYTESUR Negative 02/16/2022 1316   LEUKOCYTESUR SMALL (A) 02/01/2022 0344    Sepsis Labs: Lactic Acid, Venous    Component Value Date/Time   LATICACIDVEN 2.2 (HH) 05/22/2022 1305    MICROBIOLOGY: Recent Results (from the past 240 hour(s))  Resp panel by RT-PCR (RSV, Flu A&B, Covid) Anterior Nasal Swab     Status: None   Collection Time: 05/22/22 11:05 AM   Specimen: Anterior Nasal Swab  Result Value Ref Range Status   SARS Coronavirus 2 by RT PCR NEGATIVE NEGATIVE Final   Influenza A by PCR NEGATIVE NEGATIVE Final   Influenza B by PCR NEGATIVE NEGATIVE Final    Comment: (NOTE) The Xpert Xpress SARS-CoV-2/FLU/RSV plus assay is intended as an aid in the diagnosis of influenza from Nasopharyngeal swab specimens and should not be used as a sole basis for treatment. Nasal washings  and aspirates are unacceptable for Xpert Xpress SARS-CoV-2/FLU/RSV testing.  Fact Sheet for Patients: BloggerCourse.com  Fact Sheet for Healthcare  Providers: SeriousBroker.it  This test is not yet approved or cleared by the [place] FDA and has been authorized for detection and/or diagnosis of SARS-CoV-2 by FDA under an Emergency Use Authorization (EUA). This EUA will remain in effect (meaning this test can be used) for the duration of the COVID-19 declaration under Section 564(b)(1) of the Act, 21 U.S.C. section 360bbb-3(b)(1), unless the authorization is terminated or revoked.     Resp Syncytial Virus by PCR NEGATIVE NEGATIVE Final    Comment: (NOTE) Fact Sheet for Patients: BloggerCourse.com  Fact Sheet for Healthcare Providers: SeriousBroker.it  This test is not yet approved or cleared by the [place] FDA and has been authorized for detection and/or diagnosis of SARS-CoV-2 by FDA under an Emergency Use Authorization (EUA). This EUA will remain in effect (meaning this test can be used) for the duration of the COVID-19 declaration under Section 564(b)(1) of the Act, 21 U.S.C. section 360bbb-3(b)(1), unless the authorization is terminated or revoked.  Performed at [facility], [address] [place], [place] [address]   Blood Culture (routine x 2)     Status: None   Collection Time: 05/22/22 11:10 AM   Specimen: BLOOD  Result Value Ref Range Status   Specimen Description BLOOD RIGHT ANTECUBITAL  Final   Special Requests   Final    BOTTLES DRAWN AEROBIC AND ANAEROBIC Blood Culture results may not be optimal due to an excessive volume of blood received in culture bottles   Culture   Final    NO GROWTH 5 DAYS Performed at [facility], [address], [place], [place] [address]    Report Status 05/27/2022 FINAL  Final  Blood Culture (routine x 2)     Status: None   Collection Time: 05/22/22 11:36 AM   Specimen: BLOOD RIGHT FOREARM  Result Value Ref Range Status   Specimen Description BLOOD RIGHT FOREARM  Final    Special Requests   Final    BOTTLES DRAWN AEROBIC AND ANAEROBIC Blood Culture adequate volume   Culture   Final    NO GROWTH 5 DAYS Performed at [facility], [address], [place], [place] [address]    Report Status 05/27/2022 FINAL  Final  Respiratory (~20 pathogens) panel by PCR     Status: Abnormal   Collection Time: 05/24/22 12:37 PM   Specimen: Nasopharyngeal Swab; Respiratory  Result Value Ref Range Status   Adenovirus NOT DETECTED NOT DETECTED Final   Coronavirus 229E NOT DETECTED NOT DETECTED Final    Comment: (NOTE) The Coronavirus on the Respiratory Panel, DOES NOT test for the novel  Coronavirus (2019 nCoV)    Coronavirus HKU1 NOT DETECTED NOT DETECTED Final   Coronavirus NL63 NOT DETECTED NOT DETECTED Final   Coronavirus OC43 NOT DETECTED NOT DETECTED Final   Metapneumovirus DETECTED (A) NOT DETECTED Final   Rhinovirus / Enterovirus NOT DETECTED NOT DETECTED Final   Influenza A NOT DETECTED NOT DETECTED Final   Influenza B NOT DETECTED NOT DETECTED Final   Parainfluenza Virus 1 NOT DETECTED NOT DETECTED Final   Parainfluenza Virus 2 NOT DETECTED NOT DETECTED Final   Parainfluenza Virus 3 NOT DETECTED NOT DETECTED Final   Parainfluenza Virus 4 NOT DETECTED NOT DETECTED Final   Respiratory Syncytial Virus NOT DETECTED NOT DETECTED Final   Bordetella pertussis NOT DETECTED NOT DETECTED Final   Bordetella  Parapertussis NOT DETECTED NOT DETECTED Final   Chlamydophila pneumoniae NOT DETECTED NOT DETECTED Final   Mycoplasma pneumoniae NOT DETECTED NOT DETECTED Final    Comment: Performed at [facility], [address] [place], [place] [address]    RADIOLOGY STUDIES/RESULTS: DG Chest Port 1 View  Result Date: 05/28/2022 CLINICAL DATA:  Shortness of breath. EXAM: PORTABLE CHEST 1 VIEW COMPARISON:  Single-view of the chest 05/22/2022 and 05/25/2022. FINDINGS: Airspace disease in the right mid and lower lung zones persist but appears improved. Mild airspace  disease is seen in the left lung base, unchanged. No pneumothorax or pleural effusion. Heart size is upper normal. TAVR stent and pacing device noted. IMPRESSION: 1. Improved appearance of airspace disease in the right mid and lower lung zones. 2. No change in mild left basilar airspace disease. Electronically Signed   By: Drusilla Kanner M.D.   On: 05/28/2022 08:21     LOS: 6 days   Jeoffrey Massed, MD  [facility]    To contact the attending provider between 7A-7P or the covering provider during after hours 7P-7A, please log into the web site [email or web address] and access using universal [place] password for that web site. If you do not have the password, please call the hospital operator.  05/28/2022, 11:39 AM

## 2022-05-29 ENCOUNTER — Inpatient Hospital Stay (HOSPITAL_COMMUNITY): Payer: Medicare HMO

## 2022-05-29 DIAGNOSIS — J45901 Unspecified asthma with (acute) exacerbation: Secondary | ICD-10-CM | POA: Diagnosis not present

## 2022-05-29 DIAGNOSIS — A419 Sepsis, unspecified organism: Secondary | ICD-10-CM | POA: Diagnosis not present

## 2022-05-29 DIAGNOSIS — J441 Chronic obstructive pulmonary disease with (acute) exacerbation: Secondary | ICD-10-CM | POA: Diagnosis not present

## 2022-05-29 DIAGNOSIS — J189 Pneumonia, unspecified organism: Secondary | ICD-10-CM | POA: Diagnosis not present

## 2022-05-29 LAB — CBC
HCT: 37.3 % (ref 36.0–46.0)
Hemoglobin: 12.6 g/dL (ref 12.0–15.0)
MCH: 30.7 pg (ref 26.0–34.0)
MCHC: 33.8 g/dL (ref 30.0–36.0)
MCV: 91 fL (ref 80.0–100.0)
Platelets: 190 10*3/uL (ref 150–400)
RBC: 4.1 MIL/uL (ref 3.87–5.11)
RDW: 13.2 % (ref 11.5–15.5)
WBC: 11.6 10*3/uL — ABNORMAL HIGH (ref 4.0–10.5)
nRBC: 0 % (ref 0.0–0.2)

## 2022-05-29 LAB — BRAIN NATRIURETIC PEPTIDE: B Natriuretic Peptide: 93.6 pg/mL (ref 0.0–100.0)

## 2022-05-29 LAB — BASIC METABOLIC PANEL
Anion gap: 8 (ref 5–15)
BUN: 24 mg/dL — ABNORMAL HIGH (ref 8–23)
CO2: 29 mmol/L (ref 22–32)
Calcium: 8.5 mg/dL — ABNORMAL LOW (ref 8.9–10.3)
Chloride: 100 mmol/L (ref 98–111)
Creatinine, Ser: 0.83 mg/dL (ref 0.44–1.00)
GFR, Estimated: >60 mL/min (ref >60)
Glucose, Bld: 126 mg/dL — ABNORMAL HIGH (ref 70–99)
Potassium: 3.9 mmol/L (ref 3.5–5.1)
Sodium: 137 mmol/L (ref 135–145)

## 2022-05-29 LAB — GLUCOSE, CAPILLARY
Glucose-Capillary: 166 mg/dL — ABNORMAL HIGH (ref 70–99)
Glucose-Capillary: 211 mg/dL — ABNORMAL HIGH (ref 70–99)
Glucose-Capillary: 194 mg/dL — ABNORMAL HIGH (ref 70–99)
Glucose-Capillary: 207 mg/dL — ABNORMAL HIGH (ref 70–99)
Glucose-Capillary: 170 mg/dL — ABNORMAL HIGH (ref 70–99)

## 2022-05-29 MED ORDER — FUROSEMIDE 40 MG PO TABS
40.0000 mg | ORAL_TABLET | Freq: Every day | ORAL | Status: DC
  Administered 2022-05-29 – 2022-05-30 (×2): 40 mg via ORAL
  Filled 2022-05-29 (×2): qty 1

## 2022-05-29 MED ORDER — PREDNISONE 20 MG PO TABS
40.0000 mg | ORAL_TABLET | Freq: Every day | ORAL | Status: DC
  Administered 2022-05-30: 40 mg via ORAL
  Filled 2022-05-29: qty 2

## 2022-05-29 NOTE — Progress Notes (Signed)
Mobility Specialist - Progress Note   05/29/22 1138  Mobility  Activity Ambulated with assistance in hallway  Level of Assistance Standby assist, set-up cues, supervision of patient - no hands on  Assistive Device Front wheel walker  Distance Ambulated (ft) 140 ft  Activity Response Tolerated well  Mobility Referral Yes  $Mobility charge 1 Mobility   Pt was received in bed and agreeable to mobility. Pt was able to ambulate hallway distance with slow steady gait. Pt was returned to bed with all needs met and bed alarm on. RN aware.   Alda Lea  Mobility Specialist Please contact via Special educational needs teacher or Rehab office at [phone]

## 2022-05-29 NOTE — Progress Notes (Signed)
PROGRESS NOTE        PATIENT DETAILS Name: Briana Foster Age: 77 y.o. Sex: female Date of Birth: Oct 08, 1945 Admit Date: 05/22/2022 Admitting Physician Emeline General, MD ZOX:[id number], Jerelyn Scott, MD  Brief Summary: Patient is a 77 y.o.  female with history of asthma, chronic HFpEF, HTN, permanent atrial fibrillation, tachybradycardia syndrome-s/p PPM implantation, history of aortic stenosis-s/p TAVR-who presented with cough, fever, shortness of breath, and a mechanical fall-patient was found to have RLL PNA and asthma exacerbation.  Significant events: 4/21>> admit to [facility]  Significant studies: 4/21>> CXR: Patchy RLL opacity 4/21>> CT head: No acute intracranial abnormality 4/21>> CT C-spine: No fracture/subluxation 4/21>> X hip: No acute osseous abnormality. 4/24>> CXR: Slightly increased RLL opacity 4/27>> CXR: Improved RLL opacity  Significant microbiology data: 4/21>> blood culture: Negative 4/21>> COVID/influenza/RSV: Negative 4/21>> respiratory virus panel: Meta pneumonia virus  Procedures: None  Consults: None  Subjective: Feels much better today-compared to yesterday.  Has been transitioned to room air.  Objective: Vitals: Blood pressure (!) 149/82, pulse 64, temperature (!) 97.5 F (36.4 C), temperature source Oral, resp. rate 14, height 5\' 8"  (1.727 m), weight 90.7 kg, SpO2 94 %.   Exam: Gen Exam:Alert awake-not in any distress HEENT:atraumatic, normocephalic Chest: Few scattered rhonchi. CVS:S1S2 regular Abdomen:soft non tender, non distended Extremities:no edema Neurology: Non focal Skin: no rash  Pertinent Labs/Radiology:    Latest Ref Rng & Units 05/29/2022    2:17 AM 05/28/2022    2:51 AM 05/27/2022    5:42 AM  CBC  WBC 4.0 - 10.5 K/uL 11.6  10.0  10.7   Hemoglobin 12.0 - 15.0 g/dL 40.9  81.1  91.4   Hematocrit 36.0 - 46.0 % 37.3  38.2  38.1   Platelets 150 - 400 K/uL 190  162  157     Lab Results  Component Value Date    NA 137 05/29/2022   K 3.9 05/29/2022   CL 100 05/29/2022   CO2 29 05/29/2022      Assessment/Plan: Acute hypoxic respiratory failure due to RLL PNA and asthma exacerbation Continues to improve-per patient she feels remarkably better compared to just yesterday Now on room air. Transition to prednisone Continue IV Unasyn Lasix to ensure negative balance-as long as electrolytes stable. Continue antitussives/pulmonary tolerating/mobility Anticipate that if clinical improvement continues-we could potentially get her home tomorrow.   Although respiratory virus panel positive for Meta pneumonia virus-suspect she has developed a superimposed bacterial pneumonia-probably aspiration-although no overt signs of aspiration on SLP eval.    Sepsis secondary to RLL PNA Continue Unasyn x 7 days total of antibiotics Cultures negative so far See above.  Acute urine retention Required in/out cath x 1 on 4/24 Thankfully voiding spontaneously-watch closely.  Permanent fibrillation Not on any medications for rate control Continue Eliquis  HTN BP stable Amlodipine/Avapro Follow/optimize  DM-2 (A1c 8.1 on 4/22) with uncontrolled hyperglycemia due to steroids CBGs better Continue Semglee 15 units / 4 units of NovoLog with meals/resistant SSI Follow/optimize.    CBG (last 3)  Recent Labs    05/28/22 2104 05/29/22 0611 05/29/22 0929  GLUCAP 151* 166* 211*      Tachybradycardia syndrome-history of PPM implantation-September 2022  History of severe arctic stenosis-s/p TAVR May 2022  RLS Requip  Obesity Estimated body mass index is 30.41 kg/m as calculated from the following:   Height as of  this encounter: 5\' 8"  (1.727 m).   Weight as of this encounter: 90.7 kg.   Code status:   Code Status: Full Code   DVT Prophylaxis: apixaban (ELIQUIS) tablet 5 mg    Family Communication: Daughter-Michelle-[phone]-updated over the phone on 4/27.  Disposition Plan: Status is:  Inpatient Remains inpatient appropriate because: Severity of illness   Planned Discharge Destination: Home health with SNF-based on clinical course.   Diet: Diet Order             Diet Heart Room service appropriate? Yes; Fluid consistency: Thin  Diet effective now                     Antimicrobial agents: Anti-infectives (From admission, onward)    Start     Dose/Rate Route Frequency Ordered Stop   05/25/22 0830  Ampicillin-Sulbactam (UNASYN) 3 g in sodium chloride 0.9 % 100 mL IVPB        3 g 200 mL/hr over 30 Minutes Intravenous Every 8 hours 05/25/22 0737     05/24/22 0830  cefTRIAXone (ROCEPHIN) 2 g in sodium chloride 0.9 % 100 mL IVPB  Status:  Discontinued        2 g 200 mL/hr over 30 Minutes Intravenous Every 24 hours 05/24/22 0734 05/25/22 0720   05/23/22 1000  doxycycline (VIBRA-TABS) tablet 100 mg  Status:  Discontinued        100 mg Oral Every 12 hours 05/23/22 0956 05/27/22 1053   05/22/22 1115  cefTRIAXone (ROCEPHIN) 2 g in sodium chloride 0.9 % 100 mL IVPB  Status:  Discontinued        2 g 200 mL/hr over 30 Minutes Intravenous Every 24 hours 05/22/22 1105 05/23/22 0955   05/22/22 1115  azithromycin (ZITHROMAX) 500 mg in sodium chloride 0.9 % 250 mL IVPB  Status:  Discontinued        500 mg 250 mL/hr over 60 Minutes Intravenous Every 24 hours 05/22/22 1105 05/23/22 0955        MEDICATIONS: Scheduled Meds:  amLODipine  10 mg Oral Daily   apixaban  5 mg Oral BID   arformoterol  15 mcg Nebulization BID   budesonide (PULMICORT) nebulizer solution  2 mg Nebulization Q12H   fluticasone  2 spray Each Nare Daily   furosemide  40 mg Oral Daily   guaiFENesin  600 mg Oral BID   insulin aspart  0-20 Units Subcutaneous TID AC & HS   insulin aspart  6 Units Subcutaneous TID WC   insulin glargine-yfgn  20 Units Subcutaneous Daily   ipratropium-albuterol  3 mL Nebulization Q6H   irbesartan  300 mg Oral Daily   loratadine  10 mg Oral Daily   methylPREDNISolone  (SOLU-MEDROL) injection  40 mg Intravenous Q12H   pantoprazole  40 mg Oral Daily   rOPINIRole  4 mg Oral Q2000   Continuous Infusions:  albuterol Stopped (05/25/22 0700)   ampicillin-sulbactam (UNASYN) IV 3 g (05/29/22 0859)   PRN Meds:.acetaminophen, albuterol, alum & mag hydroxide-simeth, benzonatate, meclizine, ondansetron (ZOFRAN) IV   I have personally reviewed following labs and imaging studies  LABORATORY DATA: CBC: Recent Labs  Lab 05/22/22 1110 05/22/22 1120 05/25/22 0331 05/26/22 0356 05/27/22 0542 05/28/22 0251 05/29/22 0217  WBC 8.8   < > 14.4* 11.2* 10.7* 10.0 11.6*  NEUTROABS 6.5  --   --   --   --   --   --   HGB 13.2   < > 13.3 13.3 12.7 12.8 12.6  HCT 40.6   < > 40.8 39.5 38.1 38.2 37.3  MCV 94.4   < > 94.9 91.9 92.3 92.7 91.0  PLT 156   < > 166 168 157 162 190   < > = values in this interval not displayed.     Basic Metabolic Panel: Recent Labs  Lab 05/23/22 0424 05/25/22 0331 05/26/22 0356 05/27/22 0542 05/29/22 0217  NA 134* 134* 137 137 137  K 3.8 3.6 4.1 4.0 3.9  CL 102 100 102 105 100  CO2 20* 19* 25 24 29   GLUCOSE 286* 193* 183* 156* 126*  BUN 17 29* 31* 28* 24*  CREATININE 0.80 0.98 0.93 0.90 0.83  CALCIUM 9.3 9.2 8.8* 8.7* 8.5*  MG  --  2.5*  --   --   --   PHOS  --  3.5  --   --   --      GFR: Estimated Creatinine Clearance: 70 mL/min (by C-G formula based on SCr of 0.83 mg/dL).  Liver Function Tests: Recent Labs  Lab 05/22/22 1110 05/26/22 0356  AST 32 43*  ALT 30 40  ALKPHOS 72 53  BILITOT 1.3* 1.0  PROT 7.1 6.4*  ALBUMIN 4.0 3.4*    No results for input(s): "LIPASE", "AMYLASE" in the last 168 hours. No results for input(s): "AMMONIA" in the last 168 hours.  Coagulation Profile: Recent Labs  Lab 05/22/22 1110  INR 1.3*     Cardiac Enzymes: No results for input(s): "CKTOTAL", "CKMB", "CKMBINDEX", "TROPONINI" in the last 168 hours.  BNP (last 3 results) No results for input(s): "PROBNP" in the last 8760  hours.  Lipid Profile: No results for input(s): "CHOL", "HDL", "LDLCALC", "TRIG", "CHOLHDL", "LDLDIRECT" in the last 72 hours.  Thyroid Function Tests: No results for input(s): "TSH", "T4TOTAL", "FREET4", "T3FREE", "THYROIDAB" in the last 72 hours.   Anemia Panel: No results for input(s): "VITAMINB12", "FOLATE", "FERRITIN", "TIBC", "IRON", "RETICCTPCT" in the last 72 hours.   Urine analysis:    Component Value Date/Time   COLORURINE YELLOW 02/01/2022 0344   APPEARANCEUR Clear 02/16/2022 1316   LABSPEC >1.046 (H) 02/01/2022 0344   PHURINE 5.0 02/01/2022 0344   GLUCOSEU Negative 02/16/2022 1316   HGBUR LARGE (A) 02/01/2022 0344   BILIRUBINUR Negative 02/16/2022 1316   KETONESUR NEGATIVE 02/01/2022 0344   PROTEINUR 1+ (A) 02/16/2022 1316   PROTEINUR 100 (A) 02/01/2022 0344   NITRITE Negative 02/16/2022 1316   NITRITE NEGATIVE 02/01/2022 0344   LEUKOCYTESUR Negative 02/16/2022 1316   LEUKOCYTESUR SMALL (A) 02/01/2022 0344    Sepsis Labs: Lactic Acid, Venous    Component Value Date/Time   LATICACIDVEN 2.2 (HH) 05/22/2022 1305    MICROBIOLOGY: Recent Results (from the past 240 hour(s))  Resp panel by RT-PCR (RSV, Flu A&B, Covid) Anterior Nasal Swab     Status: None   Collection Time: 05/22/22 11:05 AM   Specimen: Anterior Nasal Swab  Result Value Ref Range Status   SARS Coronavirus 2 by RT PCR NEGATIVE NEGATIVE Final   Influenza A by PCR NEGATIVE NEGATIVE Final   Influenza B by PCR NEGATIVE NEGATIVE Final    Comment: (NOTE) The Xpert Xpress SARS-CoV-2/FLU/RSV plus assay is intended as an aid in the diagnosis of influenza from Nasopharyngeal swab specimens and should not be used as a sole basis for treatment. Nasal washings and aspirates are unacceptable for Xpert Xpress SARS-CoV-2/FLU/RSV testing.  Fact Sheet for Patients: BloggerCourse.com  Fact Sheet for Healthcare Providers: SeriousBroker.it  This test is not  yet approved or  cleared by the [place] and has been authorized for detection and/or diagnosis of SARS-CoV-2 by FDA under an Emergency Use Authorization (EUA). This EUA will remain in effect (meaning this test can be used) for the duration of the COVID-19 declaration under Section 564(b)(1) of the Act, 21 U.S.C. section 360bbb-3(b)(1), unless the authorization is terminated or revoked.     Resp Syncytial Virus by PCR NEGATIVE NEGATIVE Final    Comment: (NOTE) Fact Sheet for Patients: BloggerCourse.com  Fact Sheet for Healthcare Providers: SeriousBroker.it  This test is not yet approved or cleared by the [place] FDA and has been authorized for detection and/or diagnosis of SARS-CoV-2 by FDA under an Emergency Use Authorization (EUA). This EUA will remain in effect (meaning this test can be used) for the duration of the COVID-19 declaration under Section 564(b)(1) of the Act, 21 U.S.C. section 360bbb-3(b)(1), unless the authorization is terminated or revoked.  Performed at [facility], [address] [place], [place] [address]   Blood Culture (routine x 2)     Status: None   Collection Time: 05/22/22 11:10 AM   Specimen: BLOOD  Result Value Ref Range Status   Specimen Description BLOOD RIGHT ANTECUBITAL  Final   Special Requests   Final    BOTTLES DRAWN AEROBIC AND ANAEROBIC Blood Culture results may not be optimal due to an excessive volume of blood received in culture bottles   Culture   Final    NO GROWTH 5 DAYS Performed at [facility], [address] [place], [place] [address]    Report Status 05/27/2022 FINAL  Final  Blood Culture (routine x 2)     Status: None   Collection Time: 05/22/22 11:36 AM   Specimen: BLOOD RIGHT FOREARM  Result Value Ref Range Status   Specimen Description BLOOD RIGHT FOREARM  Final   Special Requests   Final    BOTTLES DRAWN AEROBIC AND ANAEROBIC Blood Culture  adequate volume   Culture   Final    NO GROWTH 5 DAYS Performed at [facility], [address], [place], [place] [address]    Report Status 05/27/2022 FINAL  Final  Respiratory (~20 pathogens) panel by PCR     Status: Abnormal   Collection Time: 05/24/22 12:37 PM   Specimen: Nasopharyngeal Swab; Respiratory  Result Value Ref Range Status   Adenovirus NOT DETECTED NOT DETECTED Final   Coronavirus 229E NOT DETECTED NOT DETECTED Final    Comment: (NOTE) The Coronavirus on the Respiratory Panel, DOES NOT test for the novel  Coronavirus (2019 nCoV)    Coronavirus HKU1 NOT DETECTED NOT DETECTED Final   Coronavirus NL63 NOT DETECTED NOT DETECTED Final   Coronavirus OC43 NOT DETECTED NOT DETECTED Final   Metapneumovirus DETECTED (A) NOT DETECTED Final   Rhinovirus / Enterovirus NOT DETECTED NOT DETECTED Final   Influenza A NOT DETECTED NOT DETECTED Final   Influenza B NOT DETECTED NOT DETECTED Final   Parainfluenza Virus 1 NOT DETECTED NOT DETECTED Final   Parainfluenza Virus 2 NOT DETECTED NOT DETECTED Final   Parainfluenza Virus 3 NOT DETECTED NOT DETECTED Final   Parainfluenza Virus 4 NOT DETECTED NOT DETECTED Final   Respiratory Syncytial Virus NOT DETECTED NOT DETECTED Final   Bordetella pertussis NOT DETECTED NOT DETECTED Final   Bordetella Parapertussis NOT DETECTED NOT DETECTED Final   Chlamydophila pneumoniae NOT DETECTED NOT DETECTED Final   Mycoplasma pneumoniae NOT DETECTED NOT DETECTED Final    Comment: Performed at [facility], [address], [place], [place] [address]    RADIOLOGY STUDIES/RESULTS: DG Chest Port 1V same Day  Result Date: 05/29/2022 CLINICAL DATA:  Shortness of breath EXAM: PORTABLE CHEST 1 VIEW COMPARISON:  Yesterday FINDINGS: Low volume chest with airspace disease at the right more than left base. Single chamber pacer lead into the right ventricle. Cardiomegaly. Transcatheter aortic valve replacement. No visible effusion or pneumothorax.  Artifact from EKG leads. IMPRESSION: Stable infiltrate at the lung bases. Electronically Signed   By: Tiburcio Pea M.D.   On: 05/29/2022 08:59   DG Chest Port 1 View  Result Date: 05/28/2022 CLINICAL DATA:  Shortness of breath. EXAM: PORTABLE CHEST 1 VIEW COMPARISON:  Single-view of the chest 05/22/2022 and 05/25/2022. FINDINGS: Airspace disease in the right mid and lower lung zones persist but appears improved. Mild airspace disease is seen in the left lung base, unchanged. No pneumothorax or pleural effusion. Heart size is upper normal. TAVR stent and pacing device noted. IMPRESSION: 1. Improved appearance of airspace disease in the right mid and lower lung zones. 2. No change in mild left basilar airspace disease. Electronically Signed   By: Drusilla Kanner M.D.   On: 05/28/2022 08:21     LOS: 7 days   Jeoffrey Massed, MD  [facility]    To contact the attending provider between 7A-7P or the covering provider during after hours 7P-7A, please log into the web site [email or web address] and access using universal [place] password for that web site. If you do not have the password, please call the hospital operator.  05/29/2022, 10:15 AM

## 2022-05-30 ENCOUNTER — Ambulatory Visit (INDEPENDENT_AMBULATORY_CARE_PROVIDER_SITE_OTHER): Payer: Medicare Other

## 2022-05-30 ENCOUNTER — Other Ambulatory Visit (HOSPITAL_COMMUNITY): Payer: Self-pay

## 2022-05-30 DIAGNOSIS — E1165 Type 2 diabetes mellitus with hyperglycemia: Secondary | ICD-10-CM | POA: Diagnosis not present

## 2022-05-30 DIAGNOSIS — E876 Hypokalemia: Secondary | ICD-10-CM

## 2022-05-30 DIAGNOSIS — I4819 Other persistent atrial fibrillation: Secondary | ICD-10-CM

## 2022-05-30 DIAGNOSIS — J441 Chronic obstructive pulmonary disease with (acute) exacerbation: Secondary | ICD-10-CM | POA: Diagnosis not present

## 2022-05-30 DIAGNOSIS — A419 Sepsis, unspecified organism: Secondary | ICD-10-CM | POA: Diagnosis not present

## 2022-05-30 LAB — GLUCOSE, CAPILLARY
Glucose-Capillary: 115 mg/dL — ABNORMAL HIGH (ref 70–99)
Glucose-Capillary: 137 mg/dL — ABNORMAL HIGH (ref 70–99)
Glucose-Capillary: 245 mg/dL — ABNORMAL HIGH (ref 70–99)

## 2022-05-30 MED ORDER — ALBUTEROL SULFATE HFA 108 (90 BASE) MCG/ACT IN AERS
2.0000 | INHALATION_SPRAY | RESPIRATORY_TRACT | 1 refills | Status: DC | PRN
  Filled 2022-05-30: qty 18, 30d supply, fill #0

## 2022-05-30 MED ORDER — PREDNISONE 10 MG PO TABS
ORAL_TABLET | ORAL | 0 refills | Status: DC
  Filled 2022-05-30: qty 11, 5d supply, fill #0

## 2022-05-30 MED ORDER — BENZONATATE 200 MG PO CAPS
200.0000 mg | ORAL_CAPSULE | Freq: Three times a day (TID) | ORAL | 0 refills | Status: DC | PRN
  Filled 2022-05-30: qty 20, 7d supply, fill #0

## 2022-05-30 MED ORDER — AMOXICILLIN-POT CLAVULANATE 875-125 MG PO TABS
1.0000 | ORAL_TABLET | Freq: Two times a day (BID) | ORAL | 0 refills | Status: AC
  Filled 2022-05-30: qty 4, 2d supply, fill #0

## 2022-05-30 MED ORDER — BUDESONIDE-FORMOTEROL FUMARATE 160-4.5 MCG/ACT IN AERO
2.0000 | INHALATION_SPRAY | Freq: Two times a day (BID) | RESPIRATORY_TRACT | 1 refills | Status: DC
  Filled 2022-05-30: qty 10.2, 30d supply, fill #0

## 2022-05-30 NOTE — Discharge Summary (Signed)
PATIENT DETAILS Name: Briana Foster Age: 77 y.o. Sex: female Date of Birth: 01-Aug-1945 MRN: [id number]. Admitting Physician: Emeline General, MD [id number], Jerelyn Scott, MD  Admit Date: 05/22/2022 Discharge date: 05/30/2022  Recommendations for Outpatient Follow-up:  Follow up with PCP in 1-2 weeks Please obtain CMP/CBC in one week Please repeat two-view chest x-ray in 4 to 6 weeks to document resolution of PNA.    Admitted From:  Home  Disposition: Outpatient PT   Discharge Condition: good  CODE STATUS:   Code Status: Full Code   Diet recommendation:  Diet Order             Diet - low sodium heart healthy           Diet Carb Modified           Diet Heart Room service appropriate? Yes; Fluid consistency: Thin  Diet effective now                    Brief Summary: Patient is a 77 y.o.  female with history of asthma, chronic HFpEF, HTN, permanent atrial fibrillation, tachybradycardia syndrome-s/p PPM implantation, history of aortic stenosis-s/p TAVR-who presented with cough, fever, shortness of breath, and a mechanical fall-patient was found to have RLL PNA and asthma exacerbation.   Significant events: 4/21>> admit to [facility]   Significant studies: 4/21>> CXR: Patchy RLL opacity 4/21>> CT head: No acute intracranial abnormality 4/21>> CT C-spine: No fracture/subluxation 4/21>> X hip: No acute osseous abnormality. 4/24>> CXR: Slightly increased RLL opacity 4/27>> CXR: Improved RLL opacity   Significant microbiology data: 4/21>> blood culture: Negative 4/21>> COVID/influenza/RSV: Negative 4/21>> respiratory virus panel: Meta pneumonia virus   Procedures: None   Consults: None  Brief Hospital Course: Acute hypoxic respiratory failure due to RLL PNA and asthma exacerbation Had a fluctuating hospital course with worsening hypoxemia-treated with a combination of IV steroids/Unasyn/bronchodilators with significant clinical improvement over the past 48 hours.    This morning-patient claims she is so much better-and is requesting discharge home.  Her cough is significantly improved.  She is no longer wheezing and she is on room air Plan is to transition to Augmentin-tapering prednisone and continue inhaled bronchodilators.  Continues to improve-per patient she feels remarkably better compared to just yesterday  Although respiratory virus panel positive for Meta pneumonia virus-suspect she has developed a superimposed bacterial pneumonia-probably aspiration-although no overt signs of aspiration on SLP eval.     Sepsis secondary to RLL PNA On Unasyn-will be transition to Augmentin on discharge. Cultures negative so far See above.   Acute urine retention Required in/out cath x 1 on 4/24 Thankfully voiding spontaneously-watch closely.   Permanent fibrillation Not on any medications for rate control Continue Eliquis   HTN BP stable Amlodipine/Avapro Follow/optimize   DM-2 (A1c 8.1 on 4/22) with uncontrolled hyperglycemia due to steroids CBGs better-required insulin during this hospitalization Plan is to transition her back to her oral hypoglycemic regimen-steroids are being tapered down-anticipate her sugars being better off steroids.  CBGs better   Tachybradycardia syndrome-history of PPM implantation-September 2022   History of severe arctic stenosis-s/p TAVR May 2022   RLS Requip   Obesity Estimated body mass index is 30.41 kg/m as calculated from the following:   Height as of this encounter: 5\' 8"  (1.727 m).   Weight as of this encounter: 90.7 kg.   Discharge Diagnoses:  Principal Problem:   Pneumonia Active Problems:   Chronic diastolic CHF (congestive heart failure) (HCC)   CAP (community  acquired pneumonia)   Asthma, chronic obstructive, with acute exacerbation [facility])   Discharge Instructions:  Activity:  As tolerated    Discharge Instructions     Call MD for:  difficulty breathing, headache or visual disturbances    Complete by: As directed    Call MD for:  severe uncontrolled pain   Complete by: As directed    Diet - low sodium heart healthy   Complete by: As directed    Diet Carb Modified   Complete by: As directed    Discharge instructions   Complete by: As directed    Follow with Primary MD  Georgann Housekeeper, MD in 1-2 weeks  Please ask your primary care practitioner to repeat two-view chest x-ray in 4 to 6 weeks to document resolution of pneumonia.  Please get a complete blood count and chemistry panel checked by your Primary MD at your next visit, and again as instructed by your Primary MD.  Get Medicines reviewed and adjusted: Please take all your medications with you for your next visit with your Primary MD  Laboratory/radiological data: Please request your Primary MD to go over all hospital tests and procedure/radiological results at the follow up, please ask your Primary MD to get all Hospital records sent to his/her office.  In some cases, they will be blood work, cultures and biopsy results pending at the time of your discharge. Please request that your primary care M.D. follows up on these results.  Also Note the following: If you experience worsening of your admission symptoms, develop shortness of breath, life threatening emergency, suicidal or homicidal thoughts you must seek medical attention immediately by calling 911 or calling your MD immediately  if symptoms less severe.  You must read complete instructions/literature along with all the possible adverse reactions/side effects for all the Medicines you take and that have been prescribed to you. Take any new Medicines after you have completely understood and accpet all the possible adverse reactions/side effects.   Do not drive when taking Pain medications or sleeping medications (Benzodaizepines)  Do not take more than prescribed Pain, Sleep and Anxiety Medications. It is not advisable to combine anxiety,sleep and pain  medications without talking with your primary care practitioner  Special Instructions: If you have smoked or chewed Tobacco  in the last 2 yrs please stop smoking, stop any regular Alcohol  and or any Recreational drug use.  Wear Seat belts while driving.  Please note: You were cared for by a hospitalist during your hospital stay. Once you are discharged, your primary care physician will handle any further medical issues. Please note that NO REFILLS for any discharge medications will be authorized once you are discharged, as it is imperative that you return to your primary care physician (or establish a relationship with a primary care physician if you do not have one) for your post hospital discharge needs so that they can reassess your need for medications and monitor your lab values.   Increase activity slowly   Complete by: As directed       Allergies as of 05/30/2022       Reactions   Bee Venom Shortness Of Breath, Rash   Claritin [loratadine] Itching, Palpitations   Wasp Venom Other (See Comments), Anaphylaxis   Apricot Flavor Swelling   Eyes swell shut   Atorvastatin Itching   Eye swelling Other reaction(s): HA   Losartan Itching, Rash        Medication List     TAKE these medications  acetaminophen 500 MG tablet Commonly known as: TYLENOL Take 500-1,000 mg by mouth every 8 (eight) hours as needed for moderate pain.   albuterol 108 (90 Base) MCG/ACT inhaler Commonly known as: VENTOLIN HFA Inhale 2 puffs into the lungs every 4 (four) hours as needed for wheezing or shortness of breath.   amLODipine 10 MG tablet Commonly known as: NORVASC Take 1 tablet (10 mg total) by mouth daily.   amoxicillin-clavulanate 875-125 MG tablet Commonly known as: AUGMENTIN Take 1 tablet by mouth 2 (two) times daily for 2 days.   apixaban 5 MG Tabs tablet Commonly known as: Eliquis Take 1 tablet (5 mg total) by mouth 2 (two) times daily.   benzonatate 200 MG capsule Commonly  known as: TESSALON Take 1 capsule (200 mg total) by mouth 3 (three) times daily as needed for cough.   budesonide-formoterol 160-4.5 MCG/ACT inhaler Commonly known as: Symbicort Inhale 2 puffs into the lungs 2 (two) times daily.   dapagliflozin propanediol 10 MG Tabs tablet Commonly known as: FARXIGA Take 10 mg by mouth daily.   glimepiride 2 MG tablet Commonly known as: AMARYL Take 2 mg by mouth daily with breakfast.   metFORMIN 750 MG 24 hr tablet Commonly known as: GLUCOPHAGE-XR Take 750 mg by mouth daily with breakfast.   multivitamin with minerals tablet Take 1 tablet by mouth daily.   olmesartan 40 MG tablet Commonly known as: BENICAR Take 1 tablet (40 mg total) by mouth daily.   predniSONE 10 MG tablet Commonly known as: DELTASONE Take 3 tablets (30 mg) daily for 2 days, then, Take 2 tablets (20 mg) daily for 2 days, then, Take 1 tablets (10 mg) daily for 1 days, then stop   rOPINIRole 4 MG tablet Commonly known as: REQUIP Take 4 mg by mouth at bedtime.        Follow-up Information     Georgann Housekeeper, MD. Schedule an appointment as soon as possible for a visit in 1 week(s).   Specialty: Internal Medicine Contact information: [address] [place] [address] [phone]                Allergies  Allergen Reactions   Bee Venom Shortness Of Breath and Rash   Claritin [Loratadine] Itching and Palpitations   Wasp Venom Other (See Comments) and Anaphylaxis   Apricot Flavor Swelling    Eyes swell shut   Atorvastatin Itching    Eye swelling Other reaction(s): HA   Losartan Itching and Rash     Other Procedures/Studies: DG Chest Port 1V same Day  Result Date: 05/29/2022 CLINICAL DATA:  Shortness of breath EXAM: PORTABLE CHEST 1 VIEW COMPARISON:  Yesterday FINDINGS: Low volume chest with airspace disease at the right more than left base. Single chamber pacer lead into the right ventricle. Cardiomegaly. Transcatheter aortic  valve replacement. No visible effusion or pneumothorax. Artifact from EKG leads. IMPRESSION: Stable infiltrate at the lung bases. Electronically Signed   By: Tiburcio Pea M.D.   On: 05/29/2022 08:59   DG Chest Port 1 View  Result Date: 05/28/2022 CLINICAL DATA:  Shortness of breath. EXAM: PORTABLE CHEST 1 VIEW COMPARISON:  Single-view of the chest 05/22/2022 and 05/25/2022. FINDINGS: Airspace disease in the right mid and lower lung zones persist but appears improved. Mild airspace disease is seen in the left lung base, unchanged. No pneumothorax or pleural effusion. Heart size is upper normal. TAVR stent and pacing device noted. IMPRESSION: 1. Improved appearance of airspace disease in the right mid  and lower lung zones. 2. No change in mild left basilar airspace disease. Electronically Signed   By: Drusilla Kanner M.D.   On: 05/28/2022 08:21   DG CHEST PORT 1 VIEW  Result Date: 05/25/2022 CLINICAL DATA:  Wheezing. EXAM: PORTABLE CHEST 1 VIEW COMPARISON:  05/22/2022. FINDINGS: The heart is enlarged and the mediastinal contour is within normal limits. A TAVR stent is noted. There is atherosclerotic calcification of the aorta. Interstitial prominence is noted bilaterally. Airspace opacities are noted in the mid to lower right lung, slightly increased from the prior exam. No effusion or pneumothorax. A single lead pacemaker device is present over the left chest. No acute osseous abnormality. IMPRESSION: Airspace opacities in the mid to lower right lung field, slightly increased from the prior exam. Electronically Signed   By: Thornell Sartorius M.D.   On: 05/25/2022 02:27   DG Hips Bilat W or Wo Pelvis 3-4 Views  Result Date: 05/22/2022 CLINICAL DATA:  Pain after fall EXAM: DG HIP (WITH OR WITHOUT PELVIS) 4V BILAT COMPARISON:  None Available. FINDINGS: There is no evidence of hip fracture or dislocation. There is no evidence of arthropathy or other focal bone abnormality. IMPRESSION: No acute osseous  abnormality Electronically Signed   By: Karen Kays M.D.   On: 05/22/2022 13:09   CT Head Wo Contrast  Result Date: 05/22/2022 CLINICAL DATA:  Head trauma, minor (Age >= 65y); Neck trauma (Age >= 65y) EXAM: CT HEAD WITHOUT CONTRAST CT CERVICAL SPINE WITHOUT CONTRAST TECHNIQUE: Multidetector CT imaging of the head and cervical spine was performed following the standard protocol without intravenous contrast. Multiplanar CT image reconstructions of the cervical spine were also generated. RADIATION DOSE REDUCTION: This exam was performed according to the departmental dose-optimization program which includes automated exposure control, adjustment of the mA and/or kV according to patient size and/or use of iterative reconstruction technique. COMPARISON:  01/26/2022 FINDINGS: CT HEAD FINDINGS Brain: No evidence of acute infarction, hemorrhage, hydrocephalus, extra-axial collection or mass lesion/mass effect. Age related cerebral volume loss. Vascular: No hyperdense vessel or unexpected calcification. Skull: Normal. Negative for fracture or focal lesion. Sinuses/Orbits: No acute finding. Other: None. CT CERVICAL SPINE FINDINGS Technical note: Images are degraded by patient motion artifact. Alignment: Facet joints are aligned without dislocation or traumatic listhesis. Dens and lateral masses are aligned. Skull base and vertebrae: No acute fracture. No primary bone lesion or focal pathologic process. Soft tissues and spinal canal: No prevertebral fluid or swelling. No visible canal hematoma. Disc levels: Degenerative disc disease of C5-6. Advanced multilevel bilateral facet arthropathy. Upper chest: Negative. Other: Bilateral carotid atherosclerosis. IMPRESSION: 1. No acute intracranial abnormality. 2. No acute fracture or subluxation of the cervical spine. Electronically Signed   By: Duanne Guess D.O.   On: 05/22/2022 12:54   CT Cervical Spine Wo Contrast  Result Date: 05/22/2022 CLINICAL DATA:  Head trauma,  minor (Age >= 65y); Neck trauma (Age >= 65y) EXAM: CT HEAD WITHOUT CONTRAST CT CERVICAL SPINE WITHOUT CONTRAST TECHNIQUE: Multidetector CT imaging of the head and cervical spine was performed following the standard protocol without intravenous contrast. Multiplanar CT image reconstructions of the cervical spine were also generated. RADIATION DOSE REDUCTION: This exam was performed according to the departmental dose-optimization program which includes automated exposure control, adjustment of the mA and/or kV according to patient size and/or use of iterative reconstruction technique. COMPARISON:  01/26/2022 FINDINGS: CT HEAD FINDINGS Brain: No evidence of acute infarction, hemorrhage, hydrocephalus, extra-axial collection or mass lesion/mass effect. Age related cerebral volume loss.  Vascular: No hyperdense vessel or unexpected calcification. Skull: Normal. Negative for fracture or focal lesion. Sinuses/Orbits: No acute finding. Other: None. CT CERVICAL SPINE FINDINGS Technical note: Images are degraded by patient motion artifact. Alignment: Facet joints are aligned without dislocation or traumatic listhesis. Dens and lateral masses are aligned. Skull base and vertebrae: No acute fracture. No primary bone lesion or focal pathologic process. Soft tissues and spinal canal: No prevertebral fluid or swelling. No visible canal hematoma. Disc levels: Degenerative disc disease of C5-6. Advanced multilevel bilateral facet arthropathy. Upper chest: Negative. Other: Bilateral carotid atherosclerosis. IMPRESSION: 1. No acute intracranial abnormality. 2. No acute fracture or subluxation of the cervical spine. Electronically Signed   By: Duanne Guess D.O.   On: 05/22/2022 12:54   DG Chest Port 1 View  Result Date: 05/22/2022 CLINICAL DATA:  Sepsis EXAM: PORTABLE CHEST 1 VIEW COMPARISON:  03/21/2022 FINDINGS: Single lead left-sided implanted cardiac device remains in place. Stable cardiomegaly status post TAVR. Aortic  atherosclerosis. Coarsened interstitial markings bilaterally with new patchy right lower lobe airspace opacity. No pleural effusion or pneumothorax. IMPRESSION: New patchy right lower lobe airspace opacity, suspicious for pneumonia. Electronically Signed   By: Duanne Guess D.O.   On: 05/22/2022 11:55     TODAY-DAY OF DISCHARGE:  Subjective:   Takoya Siegman today has no headache,no chest abdominal pain,no new weakness tingling or numbness, feels much better wants to go home today.   Objective:   Blood pressure (!) 150/79, pulse 83, temperature 98.1 F (36.7 C), temperature source Oral, resp. rate 20, height 5\' 8"  (1.727 m), weight 90.7 kg, SpO2 93 %.  Intake/Output Summary (Last 24 hours) at 05/30/2022 0802 Last data filed at 05/30/2022 0500 Gross per 24 hour  Intake --  Output 950 ml  Net -950 ml   Filed Weights   05/22/22 1100  Weight: 90.7 kg    Exam: Awake Alert, Oriented *3, No new F.N deficits, Normal affect .AT,PERRAL Supple Neck,No JVD, No cervical lymphadenopathy appriciated.  Symmetrical Chest wall movement, Good air movement bilaterally, CTAB RRR,No Gallops,Rubs or new Murmurs, No Parasternal Heave +ve B.Sounds, Abd Soft, Non tender, No organomegaly appriciated, No rebound -guarding or rigidity. No Cyanosis, Clubbing or edema, No new Rash or bruise   PERTINENT RADIOLOGIC STUDIES: DG Chest Port 1V same Day  Result Date: 05/29/2022 CLINICAL DATA:  Shortness of breath EXAM: PORTABLE CHEST 1 VIEW COMPARISON:  Yesterday FINDINGS: Low volume chest with airspace disease at the right more than left base. Single chamber pacer lead into the right ventricle. Cardiomegaly. Transcatheter aortic valve replacement. No visible effusion or pneumothorax. Artifact from EKG leads. IMPRESSION: Stable infiltrate at the lung bases. Electronically Signed   By: Tiburcio Pea M.D.   On: 05/29/2022 08:59   DG Chest Port 1 View  Result Date: 05/28/2022 CLINICAL DATA:  Shortness of  breath. EXAM: PORTABLE CHEST 1 VIEW COMPARISON:  Single-view of the chest 05/22/2022 and 05/25/2022. FINDINGS: Airspace disease in the right mid and lower lung zones persist but appears improved. Mild airspace disease is seen in the left lung base, unchanged. No pneumothorax or pleural effusion. Heart size is upper normal. TAVR stent and pacing device noted. IMPRESSION: 1. Improved appearance of airspace disease in the right mid and lower lung zones. 2. No change in mild left basilar airspace disease. Electronically Signed   By: Drusilla Kanner M.D.   On: 05/28/2022 08:21     PERTINENT LAB RESULTS: CBC: Recent Labs    05/28/22 0251 05/29/22 0217  WBC  10.0 11.6*  HGB 12.8 12.6  HCT 38.2 37.3  PLT 162 190   CMET CMP     Component Value Date/Time   NA 137 05/29/2022 0217   NA 139 06/24/2021 1527   K 3.9 05/29/2022 0217   CL 100 05/29/2022 0217   CO2 29 05/29/2022 0217   GLUCOSE 126 (H) 05/29/2022 0217   BUN 24 (H) 05/29/2022 0217   BUN 13 06/24/2021 1527   CREATININE 0.83 05/29/2022 0217   CALCIUM 8.5 (L) 05/29/2022 0217   PROT 6.4 (L) 05/26/2022 0356   PROT 6.9 03/22/2018 1329   ALBUMIN 3.4 (L) 05/26/2022 0356   ALBUMIN 4.6 03/22/2018 1329   AST 43 (H) 05/26/2022 0356   ALT 40 05/26/2022 0356   ALKPHOS 53 05/26/2022 0356   BILITOT 1.0 05/26/2022 0356   BILITOT 0.4 03/22/2018 1329   GFRNONAA >60 05/29/2022 0217   GFRAA 79 10/31/2019 1035    GFR Estimated Creatinine Clearance: 70 mL/min (by C-G formula based on SCr of 0.83 mg/dL). No results for input(s): "LIPASE", "AMYLASE" in the last 72 hours. No results for input(s): "CKTOTAL", "CKMB", "CKMBINDEX", "TROPONINI" in the last 72 hours. Invalid input(s): "POCBNP" No results for input(s): "DDIMER" in the last 72 hours. No results for input(s): "HGBA1C" in the last 72 hours. No results for input(s): "CHOL", "HDL", "LDLCALC", "TRIG", "CHOLHDL", "LDLDIRECT" in the last 72 hours. No results for input(s): "TSH", "T4TOTAL",  "T3FREE", "THYROIDAB" in the last 72 hours.  Invalid input(s): "FREET3" No results for input(s): "VITAMINB12", "FOLATE", "FERRITIN", "TIBC", "IRON", "RETICCTPCT" in the last 72 hours. Coags: No results for input(s): "INR" in the last 72 hours.  Invalid input(s): "PT" Microbiology: Recent Results (from the past 240 hour(s))  Resp panel by RT-PCR (RSV, Flu A&B, Covid) Anterior Nasal Swab     Status: None   Collection Time: 05/22/22 11:05 AM   Specimen: Anterior Nasal Swab  Result Value Ref Range Status   SARS Coronavirus 2 by RT PCR NEGATIVE NEGATIVE Final   Influenza A by PCR NEGATIVE NEGATIVE Final   Influenza B by PCR NEGATIVE NEGATIVE Final    Comment: (NOTE) The Xpert Xpress SARS-CoV-2/FLU/RSV plus assay is intended as an aid in the diagnosis of influenza from Nasopharyngeal swab specimens and should not be used as a sole basis for treatment. Nasal washings and aspirates are unacceptable for Xpert Xpress SARS-CoV-2/FLU/RSV testing.  Fact Sheet for Patients: BloggerCourse.com  Fact Sheet for Healthcare Providers: SeriousBroker.it  This test is not yet approved or cleared by the [place] FDA and has been authorized for detection and/or diagnosis of SARS-CoV-2 by FDA under an Emergency Use Authorization (EUA). This EUA will remain in effect (meaning this test can be used) for the duration of the COVID-19 declaration under Section 564(b)(1) of the Act, 21 U.S.C. section 360bbb-3(b)(1), unless the authorization is terminated or revoked.     Resp Syncytial Virus by PCR NEGATIVE NEGATIVE Final    Comment: (NOTE) Fact Sheet for Patients: BloggerCourse.com  Fact Sheet for Healthcare Providers: SeriousBroker.it  This test is not yet approved or cleared by the [place] FDA and has been authorized for detection and/or diagnosis of SARS-CoV-2 by FDA under an Emergency Use  Authorization (EUA). This EUA will remain in effect (meaning this test can be used) for the duration of the COVID-19 declaration under Section 564(b)(1) of the Act, 21 U.S.C. section 360bbb-3(b)(1), unless the authorization is terminated or revoked.  Performed at [facility], [address], [place], [place] [address]  Blood Culture (routine x 2)     Status: None   Collection Time: 05/22/22 11:10 AM   Specimen: BLOOD  Result Value Ref Range Status   Specimen Description BLOOD RIGHT ANTECUBITAL  Final   Special Requests   Final    BOTTLES DRAWN AEROBIC AND ANAEROBIC Blood Culture results may not be optimal due to an excessive volume of blood received in culture bottles   Culture   Final    NO GROWTH 5 DAYS Performed at [facility], [address] [place], [place] [address]    Report Status 05/27/2022 FINAL  Final  Blood Culture (routine x 2)     Status: None   Collection Time: 05/22/22 11:36 AM   Specimen: BLOOD RIGHT FOREARM  Result Value Ref Range Status   Specimen Description BLOOD RIGHT FOREARM  Final   Special Requests   Final    BOTTLES DRAWN AEROBIC AND ANAEROBIC Blood Culture adequate volume   Culture   Final    NO GROWTH 5 DAYS Performed at [facility], [address], [place], [place] [address]    Report Status 05/27/2022 FINAL  Final  Respiratory (~20 pathogens) panel by PCR     Status: Abnormal   Collection Time: 05/24/22 12:37 PM   Specimen: Nasopharyngeal Swab; Respiratory  Result Value Ref Range Status   Adenovirus NOT DETECTED NOT DETECTED Final   Coronavirus 229E NOT DETECTED NOT DETECTED Final    Comment: (NOTE) The Coronavirus on the Respiratory Panel, DOES NOT test for the novel  Coronavirus (2019 nCoV)    Coronavirus HKU1 NOT DETECTED NOT DETECTED Final   Coronavirus NL63 NOT DETECTED NOT DETECTED Final   Coronavirus OC43 NOT DETECTED NOT DETECTED Final   Metapneumovirus DETECTED (A) NOT DETECTED Final   Rhinovirus /  Enterovirus NOT DETECTED NOT DETECTED Final   Influenza A NOT DETECTED NOT DETECTED Final   Influenza B NOT DETECTED NOT DETECTED Final   Parainfluenza Virus 1 NOT DETECTED NOT DETECTED Final   Parainfluenza Virus 2 NOT DETECTED NOT DETECTED Final   Parainfluenza Virus 3 NOT DETECTED NOT DETECTED Final   Parainfluenza Virus 4 NOT DETECTED NOT DETECTED Final   Respiratory Syncytial Virus NOT DETECTED NOT DETECTED Final   Bordetella pertussis NOT DETECTED NOT DETECTED Final   Bordetella Parapertussis NOT DETECTED NOT DETECTED Final   Chlamydophila pneumoniae NOT DETECTED NOT DETECTED Final   Mycoplasma pneumoniae NOT DETECTED NOT DETECTED Final    Comment: Performed at [facility], [address] [place], [place] [address]    FURTHER DISCHARGE INSTRUCTIONS:  Get Medicines reviewed and adjusted: Please take all your medications with you for your next visit with your Primary MD  Laboratory/radiological data: Please request your Primary MD to go over all hospital tests and procedure/radiological results at the follow up, please ask your Primary MD to get all Hospital records sent to his/her office.  In some cases, they will be blood work, cultures and biopsy results pending at the time of your discharge. Please request that your primary care M.D. goes through all the records of your hospital data and follows up on these results.  Also Note the following: If you experience worsening of your admission symptoms, develop shortness of breath, life threatening emergency, suicidal or homicidal thoughts you must seek medical attention immediately by calling 911 or calling your MD immediately  if symptoms less severe.  You must read complete instructions/literature along with all the possible adverse reactions/side effects for all  the Medicines you take and that have been prescribed to you. Take any new Medicines after you have completely understood and accpet all the possible adverse  reactions/side effects.   Do not drive when taking Pain medications or sleeping medications (Benzodaizepines)  Do not take more than prescribed Pain, Sleep and Anxiety Medications. It is not advisable to combine anxiety,sleep and pain medications without talking with your primary care practitioner  Special Instructions: If you have smoked or chewed Tobacco  in the last 2 yrs please stop smoking, stop any regular Alcohol  and or any Recreational drug use.  Wear Seat belts while driving.  Please note: You were cared for by a hospitalist during your hospital stay. Once you are discharged, your primary care physician will handle any further medical issues. Please note that NO REFILLS for any discharge medications will be authorized once you are discharged, as it is imperative that you return to your primary care physician (or establish a relationship with a primary care physician if you do not have one) for your post hospital discharge needs so that they can reassess your need for medications and monitor your lab values.  Total Time spent coordinating discharge including counseling, education and face to face time equals greater than 30 minutes.  SignedJeoffrey Massed 05/30/2022 8:02 AM

## 2022-05-30 NOTE — TOC Transition Note (Signed)
Transition of Care [facility]) - CM/SW Discharge Note   Patient Details  Name: Briana Foster MRN: [id number] Date of Birth: 08-Oct-1945  Transition of Care [facility]) CM/SW Contact:  Gordy Clement, RN Phone Number: 05/30/2022, 9:33 AM   Clinical Narrative:     Patient will dc to home today with Family.  Outpatient PT has been arranged at [facility]. No additional recommendations or TOC needs for DC.           Patient Goals and CMS Choice      Discharge Placement                         Discharge Plan and Services Additional resources added to the After Visit Summary for                                       Social Determinants of Health (SDOH) Interventions SDOH Screenings   Food Insecurity: No Food Insecurity (05/22/2022)  Housing: Low Risk  (05/22/2022)  Transportation Needs: No Transportation Needs (05/22/2022)  Utilities: Not At Risk (05/22/2022)  Depression (PHQ2-9): Medium Risk (03/22/2018)  Tobacco Use: Low Risk  (05/22/2022)     Readmission Risk Interventions    11/05/2021    3:55 PM  Readmission Risk Prevention Plan  Post Dischage Appt Complete  Medication Screening Complete  Transportation Screening Complete

## 2022-05-30 NOTE — Progress Notes (Signed)
Physical Therapy Treatment Patient Details Name: Briana Foster MRN: [id number] DOB: 08-08-1945 Today's Date: 05/30/2022   History of Present Illness 77 y.o. female presents to [facility] hospital on 05/22/2022 with cough, wheezing, SOB, fever. Pt found to have RLL CAP. Pt also with a fall morning of presentation, reports feeling lightheaded prior to fall. PMH includes OA, CAD, depression, HTN, HLD, PAF, RA, DMII.    PT Comments    Pt greeted resting in bed and agreeable to session with continued progress towards acute goals. Pt demonstrating good dynamic sitting balance, able to don socks and dress sitting up EOB with set up assist without LOB. Pt requiring grossly supervision for bed mobility, functional transfers and gait with RW support, without AD pt needing light min A with HHA to step pivot EOB<>BSC. Pt was educated on continued walker use to maximize functional independence, safety, and decrease risk for falls. VSS on RA during activity. Current plan remains appropriate to address deficits and maximize functional independence. Pt continues to benefit from skilled PT services to progress toward functional mobility goals.     Recommendations for follow up therapy are one component of a multi-disciplinary discharge planning process, led by the attending physician.  Recommendations may be updated based on patient status, additional functional criteria and insurance authorization.  Follow Up Recommendations       Assistance Recommended at Discharge Intermittent Supervision/Assistance  Patient can return home with the following A little help with walking and/or transfers;A little help with bathing/dressing/bathroom;Assistance with cooking/housework;Assist for transportation;Help with stairs or ramp for entrance   Equipment Recommendations  None recommended by PT    Recommendations for Other Services       Precautions / Restrictions Precautions Precautions: Fall Precaution Comments: history  of vertigo, takes meclizine often at home Restrictions Weight Bearing Restrictions: No     Mobility  Bed Mobility Overal bed mobility: Modified Independent             General bed mobility comments: use of bed features, no assist needed    Transfers Overall transfer level: Needs assistance Equipment used: Rolling walker (2 wheels), None Transfers: Sit to/from Stand, Bed to chair/wheelchair/BSC Sit to Stand: Supervision   Step pivot transfers: Min assist       General transfer comment: supervision for rise from EOB and BSC x3 without AD, light min A with HHA to step pivot to BSC without RW use    Ambulation/Gait Ambulation/Gait assistance: Supervision Gait Distance (Feet): 100 Feet Assistive device: Rolling walker (2 wheels) Gait Pattern/deviations: Decreased stride length, Step-to pattern, Shuffle, Wide base of support Gait velocity: reduced     General Gait Details: pt with flexed posture initially, improved with cues for proximity to RW. no LOB throughout, overall cautious slow gait pattern.   Stairs             Wheelchair Mobility    Modified Rankin (Stroke Patients Only)       Balance Overall balance assessment: Needs assistance Sitting-balance support: Feet supported Sitting balance-Leahy Scale: Good     Standing balance support: Bilateral upper extremity supported, During functional activity Standing balance-Leahy Scale: Fair Standing balance comment: less than fair dynamically                            Cognition Arousal/Alertness: Awake/alert Behavior During Therapy: WFL for tasks assessed/performed Overall Cognitive Status: Within Functional Limits for tasks assessed  Exercises      General Comments        Pertinent Vitals/Pain Pain Assessment Pain Assessment: Faces Faces Pain Scale: Hurts a little bit Pain Location: torso from coughing Pain Descriptors /  Indicators: Sore Pain Intervention(s): Monitored during session, Limited activity within patient's tolerance    Home Living                          Prior Function            PT Goals (current goals can now be found in the care plan section) Acute Rehab PT Goals Patient Stated Goal: to go home PT Goal Formulation: With patient Time For Goal Achievement: 06/06/22 Potential to Achieve Goals: Fair Progress towards PT goals: Progressing toward goals    Frequency    Min 3X/week      PT Plan Current plan remains appropriate    Co-evaluation              AM-PAC PT "6 Clicks" Mobility   Outcome Measure  Help needed turning from your back to your side while in a flat bed without using bedrails?: A Little Help needed moving from lying on your back to sitting on the side of a flat bed without using bedrails?: A Little Help needed moving to and from a bed to a chair (including a wheelchair)?: A Little Help needed standing up from a chair using your arms (e.g., wheelchair or bedside chair)?: A Little Help needed to walk in hospital room?: A Little Help needed climbing 3-5 steps with a railing? : A Little 6 Click Score: 18    End of Session   Activity Tolerance: Patient tolerated treatment well;Treatment limited secondary to medical complications (Comment) Patient left: with call bell/phone within reach;in chair Nurse Communication: Mobility status PT Visit Diagnosis: Other abnormalities of gait and mobility (R26.89);Other symptoms and signs involving the nervous system (W29.562)     Time: 1308-6578 PT Time Calculation (min) (ACUTE ONLY): 23 min  Charges:  $Gait Training: 8-22 mins $Therapeutic Activity: 8-22 mins                    Briana Foster R. PTA Acute Rehabilitation Services Office: [phone]    Catalina Antigua 05/30/2022, 10:33 AM

## 2022-05-31 LAB — CUP PACEART REMOTE DEVICE CHECK
Battery Remaining Longevity: 129 mo
Battery Remaining Percentage: 91 %
Battery Voltage: 3.04 V
Brady Statistic RV Percent Paced: 98 %
Date Time Interrogation Session: 20240429163031
Implantable Lead Connection Status: 753985
Implantable Lead Implant Date: 20220928
Implantable Lead Location: 753860
Implantable Pulse Generator Implant Date: 20220928
Lead Channel Impedance Value: 490 Ohm
Lead Channel Pacing Threshold Amplitude: 0.625 V
Lead Channel Pacing Threshold Pulse Width: 0.4 ms
Lead Channel Sensing Intrinsic Amplitude: 6 mV
Lead Channel Setting Pacing Amplitude: 0.875
Lead Channel Setting Pacing Pulse Width: 0.4 ms
Lead Channel Setting Sensing Sensitivity: 2 mV
Pulse Gen Model: 1272
Pulse Gen Serial Number: 3937998

## 2022-06-13 ENCOUNTER — Other Ambulatory Visit: Payer: Self-pay

## 2022-06-13 DIAGNOSIS — I4819 Other persistent atrial fibrillation: Secondary | ICD-10-CM

## 2022-06-13 MED ORDER — APIXABAN 5 MG PO TABS
5.0000 mg | ORAL_TABLET | Freq: Two times a day (BID) | ORAL | 5 refills | Status: DC
Start: 2022-06-13 — End: 2022-06-23

## 2022-06-13 NOTE — Telephone Encounter (Signed)
Prescription refill request for Eliquis received. Indication:Afib Last office visit:2/24 Scr:0.8  4/24 Age: 77 Weight:90.7  kg  Prescription refilled

## 2022-06-14 ENCOUNTER — Other Ambulatory Visit (HOSPITAL_COMMUNITY): Payer: Self-pay

## 2022-06-14 ENCOUNTER — Telehealth: Payer: Self-pay

## 2022-06-14 NOTE — Telephone Encounter (Signed)
Pharmacy Patient Advocate Encounter   Received notification from [facility] that prior authorization for ELIQUIS 5MG  is required/requested.   PA submitted on 5.14.24 to (ins) [employer] via CoverMyMeds Key or (Medicaid) confirmation # [id number]   Status is pending

## 2022-06-14 NOTE — Telephone Encounter (Signed)
PA has been approved through 01/31/2023  Approval letter has been attached in patients documents. 

## 2022-06-23 ENCOUNTER — Other Ambulatory Visit: Payer: Self-pay | Admitting: *Deleted

## 2022-06-23 DIAGNOSIS — I4819 Other persistent atrial fibrillation: Secondary | ICD-10-CM

## 2022-06-23 MED ORDER — APIXABAN 5 MG PO TABS
5.0000 mg | ORAL_TABLET | Freq: Two times a day (BID) | ORAL | 5 refills | Status: AC
Start: 2022-06-23 — End: ?

## 2022-06-23 NOTE — Telephone Encounter (Signed)
Prescription refill request for Eliquis received. Indication: afib  Last office visit:2/272024, weaver Scr: 0.83, 05/29/2022 Age: 77 Weight: 90.7 kg

## 2022-06-27 NOTE — Progress Notes (Deleted)
Cardiology Office Note:    Date:  06/27/2022  ID:  Briana Foster, DOB 06-29-1945, MRN [id number] PCP: Briana Housekeeper, MD  [facility] Providers Cardiologist:  Briana Pyo, MD Electrophysiologist:  Briana Lemming, MD { Click to update primary MD,subspecialty MD or APP then REFRESH:1}    {Click to Open Review  :1}   Patient Profile:     Aortic stenosis S/p TAVR in 05/2020  TTE 05/20/2021: EF 60-65, no RWMA, mild asymmetric LVH, normal RVSF, normal PASP, severe LAE, trivial MR, s/p TAVR-normal structure and function (mean gradient 7), RAP 8 LHC 06/17/2020: Normal coronary arteries Permanent atrial fibrillation  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 female who returns for follow-up of hypertension, atrial fibrillation.  She was last seen in clinic 03/29/2022.  She was admitted in April with community-acquired pneumonia.***  ROS ***    Studies Reviewed:    EKG:  ***  *** Risk Assessment/Calculations:   {Does this patient have ATRIAL FIBRILLATION?:[phone]} No BP recorded.  {Refresh Note OR Click here to enter BP  :1}***       Physical Exam:   VS:  There were no vitals taken for this visit.   Wt Readings from Last 3 Encounters:  05/22/22 200 lb (90.7 kg)  03/29/22 200 lb 12.8 oz (91.1 kg)  03/21/22 195 lb (88.5 kg)    Physical Exam***    ASSESSMENT AND PLAN:   No problem-specific Assessment & Plan notes found for this encounter. ***{ Essential hypertension She was recently seen in the ED with hypertensive urgency. She had chest pain and minimally elevated hsTrops. She had normal cors by cath in 2022. As noted, she was seen by Dr. [place] and no further ischemic testing was felt to be necessary. She has not had chest pain since the day after she left the  ED. Her BPs have been better at home. She feels much better. She notes some balance issues and uses a cane. I would try to get her BP < 140/90. She is almost to goal. We discussed limiting sodium. Continue Amlodipine 10 mg, Olmesartan 40 mg. Monitor blood pressure for 2 weeks. If BP remains above target, consider low dose Thiazide diuretic. F/u with me in 3 mos.    Persistent atrial fibrillation (HCC) S/p AV node ablation and pacemaker implantation in the setting of tachy brady syndrome. She is now pacer dependent. She is still on digoxin. Reviewed with Dr. Elberta Fortis.  Stop Digoxin Continue Apixaban 5 mg twice daily    Aortic stenosis, severe S/p TAVR in 2022. TTE in 05/2021 with normal structure and function of TAVR. Continue SBE prophylaxis.    Carotid arterial disease (HCC) RICA 60-79% on [place] in 07/2021. She will need a f/u US in 07/2022.    Hyperlipidemia Given carotid artery disease, she should be on statin Rx. She has an allergy listed to Atorvastatin. She used to be on Rosuvastatin. It is not clear why she is off this. We can discuss further at next OV. If intol to statin Rx, consider Zetia.      :1}     {Are you ordering a CV Procedure (e.g. stress test, cath, DCCV, TEE, etc)?   Press F2        :[id number]}  Dispo:  No follow-ups on file.  Signed, Tereso Newcomer, PA-C

## 2022-06-28 ENCOUNTER — Ambulatory Visit: Payer: Medicare HMO | Attending: Physician Assistant | Admitting: Physician Assistant

## 2022-06-28 DIAGNOSIS — I10 Essential (primary) hypertension: Secondary | ICD-10-CM

## 2022-06-28 DIAGNOSIS — I4819 Other persistent atrial fibrillation: Secondary | ICD-10-CM

## 2022-07-01 DIAGNOSIS — M170 Bilateral primary osteoarthritis of knee: Secondary | ICD-10-CM | POA: Diagnosis not present

## 2022-07-01 NOTE — Progress Notes (Signed)
Remote pacemaker transmission.   

## 2022-07-08 DIAGNOSIS — M25562 Pain in left knee: Secondary | ICD-10-CM | POA: Diagnosis not present

## 2022-07-08 DIAGNOSIS — M170 Bilateral primary osteoarthritis of knee: Secondary | ICD-10-CM | POA: Diagnosis not present

## 2022-07-08 DIAGNOSIS — M25561 Pain in right knee: Secondary | ICD-10-CM | POA: Diagnosis not present

## 2022-08-19 ENCOUNTER — Ambulatory Visit: Payer: Medicare HMO | Admitting: Urology

## 2022-08-22 ENCOUNTER — Ambulatory Visit: Payer: Medicare Other

## 2022-08-29 ENCOUNTER — Ambulatory Visit: Payer: Medicare Other

## 2022-10-28 DIAGNOSIS — M25562 Pain in left knee: Secondary | ICD-10-CM | POA: Diagnosis not present

## 2022-10-28 DIAGNOSIS — M25561 Pain in right knee: Secondary | ICD-10-CM | POA: Diagnosis not present

## 2022-11-21 ENCOUNTER — Ambulatory Visit: Payer: Medicare Other

## 2022-11-22 DIAGNOSIS — M6281 Muscle weakness (generalized): Secondary | ICD-10-CM | POA: Diagnosis not present

## 2022-11-22 DIAGNOSIS — M25561 Pain in right knee: Secondary | ICD-10-CM | POA: Diagnosis not present

## 2022-11-22 DIAGNOSIS — M25562 Pain in left knee: Secondary | ICD-10-CM | POA: Diagnosis not present

## 2022-11-28 ENCOUNTER — Ambulatory Visit (INDEPENDENT_AMBULATORY_CARE_PROVIDER_SITE_OTHER): Payer: Medicare HMO

## 2022-11-28 DIAGNOSIS — I4819 Other persistent atrial fibrillation: Secondary | ICD-10-CM

## 2022-11-29 LAB — CUP PACEART REMOTE DEVICE CHECK
Battery Remaining Longevity: 123 mo
Battery Remaining Percentage: 87 %
Battery Voltage: 3.04 V
Brady Statistic RV Percent Paced: 98 %
Date Time Interrogation Session: 20241029114714
Implantable Lead Connection Status: 753985
Implantable Lead Implant Date: 20220928
Implantable Lead Location: 753860
Implantable Pulse Generator Implant Date: 20220928
Lead Channel Impedance Value: 460 Ohm
Lead Channel Pacing Threshold Amplitude: 0.625 V
Lead Channel Pacing Threshold Pulse Width: 0.4 ms
Lead Channel Sensing Intrinsic Amplitude: 9.7 mV
Lead Channel Setting Pacing Amplitude: 0.875
Lead Channel Setting Pacing Pulse Width: 0.4 ms
Lead Channel Setting Sensing Sensitivity: 2 mV
Pulse Gen Model: 1272
Pulse Gen Serial Number: 3937998

## 2022-12-03 ENCOUNTER — Other Ambulatory Visit: Payer: Self-pay

## 2022-12-03 ENCOUNTER — Encounter (HOSPITAL_COMMUNITY): Payer: Self-pay

## 2022-12-03 ENCOUNTER — Emergency Department (HOSPITAL_COMMUNITY)
Admission: EM | Admit: 2022-12-03 | Discharge: 2022-12-03 | Disposition: A | Discharge status: Home / Self Care | Payer: Medicare HMO | Attending: Emergency Medicine | Admitting: Emergency Medicine

## 2022-12-03 ENCOUNTER — Emergency Department (HOSPITAL_COMMUNITY): Payer: Medicare HMO

## 2022-12-03 DIAGNOSIS — M1711 Unilateral primary osteoarthritis, right knee: Secondary | ICD-10-CM | POA: Diagnosis not present

## 2022-12-03 DIAGNOSIS — Z7901 Long term (current) use of anticoagulants: Secondary | ICD-10-CM | POA: Diagnosis not present

## 2022-12-03 DIAGNOSIS — M1712 Unilateral primary osteoarthritis, left knee: Secondary | ICD-10-CM | POA: Insufficient documentation

## 2022-12-03 DIAGNOSIS — M170 Bilateral primary osteoarthritis of knee: Secondary | ICD-10-CM | POA: Diagnosis not present

## 2022-12-03 DIAGNOSIS — M1710 Unilateral primary osteoarthritis, unspecified knee: Secondary | ICD-10-CM

## 2022-12-03 DIAGNOSIS — M25561 Pain in right knee: Secondary | ICD-10-CM | POA: Diagnosis not present

## 2022-12-03 DIAGNOSIS — M25462 Effusion, left knee: Secondary | ICD-10-CM | POA: Diagnosis not present

## 2022-12-03 DIAGNOSIS — M25562 Pain in left knee: Secondary | ICD-10-CM | POA: Diagnosis not present

## 2022-12-03 DIAGNOSIS — Z7984 Long term (current) use of oral hypoglycemic drugs: Secondary | ICD-10-CM | POA: Diagnosis not present

## 2022-12-03 DIAGNOSIS — W19XXXA Unspecified fall, initial encounter: Secondary | ICD-10-CM | POA: Insufficient documentation

## 2022-12-03 DIAGNOSIS — E119 Type 2 diabetes mellitus without complications: Secondary | ICD-10-CM | POA: Insufficient documentation

## 2022-12-03 LAB — COMPREHENSIVE METABOLIC PANEL
ALT: 22 U/L (ref 0–44)
AST: 20 U/L (ref 15–41)
Albumin: 3.9 g/dL (ref 3.5–5.0)
Alkaline Phosphatase: 69 U/L (ref 38–126)
Anion gap: 8 (ref 5–15)
BUN: 18 mg/dL (ref 8–23)
CO2: 24 mmol/L (ref 22–32)
Calcium: 9.7 mg/dL (ref 8.9–10.3)
Chloride: 109 mmol/L (ref 98–111)
Creatinine, Ser: 1 mg/dL (ref 0.44–1.00)
GFR, Estimated: 59 mL/min — ABNORMAL LOW (ref >60)
Glucose, Bld: 161 mg/dL — ABNORMAL HIGH (ref 70–99)
Potassium: 4.2 mmol/L (ref 3.5–5.1)
Sodium: 141 mmol/L (ref 135–145)
Total Bilirubin: 0.7 mg/dL (ref 0.3–1.2)
Total Protein: 6.6 g/dL (ref 6.5–8.1)

## 2022-12-03 LAB — CBC WITH DIFFERENTIAL/PLATELET
Abs Immature Granulocytes: 0.02 10*3/uL (ref 0.00–0.07)
Basophils Absolute: 0.1 10*3/uL (ref 0.0–0.1)
Basophils Relative: 1 %
Eosinophils Absolute: 0.1 10*3/uL (ref 0.0–0.5)
Eosinophils Relative: 2 %
HCT: 37.1 % (ref 36.0–46.0)
Hemoglobin: 12.3 g/dL (ref 12.0–15.0)
Immature Granulocytes: 0 %
Lymphocytes Relative: 25 %
Lymphs Abs: 2.2 10*3/uL (ref 0.7–4.0)
MCH: 30.6 pg (ref 26.0–34.0)
MCHC: 33.2 g/dL (ref 30.0–36.0)
MCV: 92.3 fL (ref 80.0–100.0)
Monocytes Absolute: 1.1 10*3/uL — ABNORMAL HIGH (ref 0.1–1.0)
Monocytes Relative: 13 %
Neutro Abs: 5.3 10*3/uL (ref 1.7–7.7)
Neutrophils Relative %: 59 %
Platelets: 192 10*3/uL (ref 150–400)
RBC: 4.02 MIL/uL (ref 3.87–5.11)
RDW: 13.1 % (ref 11.5–15.5)
WBC: 8.9 10*3/uL (ref 4.0–10.5)
nRBC: 0 % (ref 0.0–0.2)

## 2022-12-03 LAB — CBG MONITORING, ED: Glucose-Capillary: 162 mg/dL — ABNORMAL HIGH (ref 70–99)

## 2022-12-03 LAB — CK: Total CK: 152 U/L (ref 38–234)

## 2022-12-03 MED ORDER — OXYCODONE-ACETAMINOPHEN 5-325 MG PO TABS: 1.0000 | ORAL_TABLET | Freq: Four times a day (QID) | ORAL | 0 refills | Status: DC | PRN

## 2022-12-03 MED ORDER — OXYCODONE-ACETAMINOPHEN 5-325 MG PO TABS
1.0000 | ORAL_TABLET | Freq: Once | ORAL | Status: AC
  Administered 2022-12-03: 1 via ORAL
  Filled 2022-12-03: qty 1

## 2022-12-03 NOTE — Discharge Instructions (Signed)
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  I have prescribed Percocet as needed for pain  You need to discuss with your orthopedic doctor about treatment options.  I want to suggest injections and potential knee replacement but you need to talk to your doctor about this  Return to ER if you have another fall or severe pain

## 2022-12-03 NOTE — ED Triage Notes (Addendum)
Pt arrived POV from home c/o bilateral leg weakness and her knees buckling, pt states she has had multiple falls in the past couple days with the most recent being last night, pt states she almost fell today but she was able to catch herself. Pt states today the right knee and leg is hurting the worst, pt rates the pain 8/10.

## 2022-12-03 NOTE — ED Provider Notes (Signed)
Briana Foster Provider Note   CSN: [id number] Arrival date & time: 12/03/22  1857     History  Chief Complaint  Patient presents with   Briana Foster is a 77 y.o. female history of diabetes, restless leg syndrome, bilateral knee arthritis here presenting with bilateral knee pain.  Patient states that she has been follow-up with orthopedic doctor regarding her knees.  She states that she has been on some muscle relaxants.  She states that she was told she needs physical therapy.  She tried therapy but her legs always locks up.  She states that today her legs gave away and she almost fell.  She denies any trauma today.  She states that she has some muscle spasms as well.  Patient is not currently on any pain medicine.  The history is provided by the patient.       Home Medications Prior to Admission medications   Medication Sig Start Date End Date Taking? Authorizing Provider  acetaminophen (TYLENOL) 500 MG tablet Take 500-1,000 mg by mouth every 8 (eight) hours as needed for moderate pain.    [provider]  albuterol (VENTOLIN HFA) 108 (90 Base) MCG/ACT inhaler Inhale 2 puffs into the lungs every 4 (four) hours as needed for wheezing or shortness of breath. 05/30/22   Ghimire, Werner Lean, MD  amLODipine (NORVASC) 10 MG tablet Take 1 tablet (10 mg total) by mouth daily. 11/03/21   Gaston Islam., NP  apixaban (ELIQUIS) 5 MG TABS tablet Take 1 tablet (5 mg total) by mouth 2 (two) times daily. 06/23/22   Camnitz, Andree Coss, MD  benzonatate (TESSALON) 200 MG capsule Take 1 capsule (200 mg total) by mouth 3 (three) times daily as needed for cough. 05/30/22   Ghimire, Werner Lean, MD  budesonide-formoterol (SYMBICORT) 160-4.5 MCG/ACT inhaler Inhale 2 puffs into the lungs 2 (two) times daily. 05/30/22   Ghimire, Werner Lean, MD  dapagliflozin propanediol (FARXIGA) 10 MG TABS tablet Take 10 mg by mouth daily.    [provider]  glimepiride (AMARYL) 2 MG tablet Take 2 mg by mouth daily with breakfast.    [provider]  metFORMIN (GLUCOPHAGE-XR) 750 MG 24 hr tablet Take 750 mg by mouth daily with breakfast.    [provider]  Multiple Vitamins-Minerals (MULTIVITAMIN WITH MINERALS) tablet Take 1 tablet by mouth daily.    [provider]  olmesartan (BENICAR) 40 MG tablet Take 1 tablet (40 mg total) by mouth daily. Patient not taking: Reported on 05/23/2022 03/21/22   Gerhard Munch, MD  predniSONE (DELTASONE) 10 MG tablet Take 3 tablets (30 mg) daily for 2 days, then, Take 2 tablets (20 mg) daily for 2 days, then, Take 1 tablets (10 mg) daily for 1 days, then stop 05/30/22   Maretta Bees, MD  rOPINIRole (REQUIP) 4 MG tablet Take 4 mg by mouth at bedtime.    [provider]      Allergies    Bee venom, Claritin [loratadine], Wasp venom, Apricot flavor, Atorvastatin, and Losartan    Review of Systems   Review of Systems  Musculoskeletal:        Bilateral knee pain  All other systems reviewed and are negative.   Physical Exam Updated Vital Signs BP (!) 177/69 (BP Location: Right Arm)   Pulse (!) 58   Temp 98 F (36.7 C) (Oral)   Resp 18   Ht 5\' 8"  (1.727 m)  Wt 86.2 kg   SpO2 94%   BMI 28.89 kg/m  Physical Exam Vitals and nursing note reviewed.  Constitutional:      Appearance: Normal appearance.  HENT:     Head: Normocephalic.     Nose: Nose normal.     Mouth/Throat:     Mouth: Mucous membranes are moist.  Eyes:     Extraocular Movements: Extraocular movements intact.     Pupils: Pupils are equal, round, and reactive to light.  Cardiovascular:     Rate and Rhythm: Normal rate and regular rhythm.     Pulses: Normal pulses.     Heart sounds: Normal heart sounds.  Pulmonary:     Effort: Pulmonary effort is normal.     Breath sounds: Normal breath sounds.  Abdominal:     General: Abdomen is flat. Bowel sounds are normal.     Palpations: Abdomen  is soft.  Musculoskeletal:     Cervical back: Normal range of motion and neck supple.     Comments: Mild left knee effusion.  Patient has no signs of septic knee.  Patient has normal range of motion of the right knee.  Skin:    Capillary Refill: Capillary refill takes less than 2 seconds.  Neurological:     General: No focal deficit present.     Mental Status: She is alert and oriented to person, place, and time.  Psychiatric:        Mood and Affect: Mood normal.        Behavior: Behavior normal.     ED Results / Procedures / Treatments   Labs (all labs ordered are listed, but only abnormal results are displayed) Labs Reviewed  CBG MONITORING, ED - Abnormal; Notable for the following components:      Result Value   Glucose-Capillary 162 (*)    All other components within normal limits  CBC WITH DIFFERENTIAL/PLATELET  COMPREHENSIVE METABOLIC PANEL  CK    EKG None  Radiology No results found.  Procedures Procedures    Medications Ordered in ED Medications  oxyCODONE-acetaminophen (PERCOCET/ROXICET) 5-325 MG per tablet 1 tablet (1 tablet Oral Given 12/03/22 2016)    ED Course/ Medical Decision Making/ A&P                                 Medical Decision Making Briana Foster is a 77 y.o. female here presenting with bilateral knee pain.  Patient has history of knee arthritis.  Patient has bilateral knee pain.  Plan to get bilateral knee x-rays.  Will also plan to get basic blood work and CK level.  9:52 PM I reviewed patient's labs and independently interpreted imaging studies.  Labs are unremarkable.  CK level is normal.  Patient has bilateral arthritis on x-rays.  She has no fractures.  Will discharge home with pain medicine.  I told her that she need to talk to her orthopedic doctor about treatment options.  I told her to consider injections and potential knee replacement.  Problems Addressed: Arthritis of knee: chronic illness or injury  Amount and/or  Complexity of Data Reviewed Labs: ordered. Radiology: ordered and independent interpretation performed. Decision-making details documented in ED Course.  Risk Prescription drug management.    Final Clinical Impression(s) / ED Diagnoses Final diagnoses:  None    Rx / DC Orders ED Discharge Orders     None         Chaney Malling  [place], MD 12/03/22 2154

## 2022-12-04 DIAGNOSIS — M1990 Unspecified osteoarthritis, unspecified site: Secondary | ICD-10-CM | POA: Diagnosis not present

## 2022-12-06 NOTE — Progress Notes (Unsigned)
Cardiology Office Note:   Date:  12/07/2022  ID:  Briana Foster, DOB 1945/09/29, MRN [id number] PCP:  Briana Housekeeper, MD  [facility] Providers Cardiologist:  Briana Skeans, MD Referring MD: Briana Housekeeper, MD  Chief Complaint/Reason for Referral:  Cardiology follow up ASSESSMENT:    1. S/P TAVR (transcatheter aortic valve replacement)   2. Persistent atrial fibrillation (HCC)   3. Secondary hypercoagulable state (HCC)   4. Type 2 diabetes mellitus with complication, without long-term current use of insulin (HCC)   5. Hypertension associated with diabetes (HCC)   6. Hyperlipidemia associated with type 2 diabetes mellitus (HCC)   7. Stage 3a chronic kidney disease (HCC)   8. Aortic atherosclerosis (HCC)   9. Carotid artery disease, unspecified laterality, unspecified type (HCC)   10. Cardiac pacemaker in situ   11. BMI 28.0-28.9,adult     PLAN:   In order of problems listed above: Status post TAVR: Most recent echocardiogram was reassuring with good valve function. Persistent atrial fibrillation: Continue Eliquis. Secondary hypercoagulable state: No issues with bleeding, continue Eliquis. Type 2 diabetes mellitus: Continue Eliquis and Benicar.  Patient defers SGLT2 inhibitor.  Start Crestor 10 mg. Hypertension: Monitor for now.  The patient just took her blood pressure medicine before she came in for her visit rather than in the morning. Hyperlipidemia: Crestor 10 mg and check lipids, LFTs, LP(a) 2 months; if she has an issue with this we will refer her to pharmacy for further recommendations. Stage III chronic kidney disease: Continue Farxiga and Benicar.   Aortic atherosclerosis: Continue Eliquis and lipid control; see discussion above. Carotid disease: Continue to control cardiovascular risk factors. Status pacemaker: Followed by EP. Elevated BMI: I broached the subject of GLP-1 receptor agonist therapy with the patient; will continue this discussion in the future.            Dispo:  Return in about 6 months (around 06/06/2023).      Medication Adjustments/Labs and Tests Ordered: Current medicines are reviewed at length with the patient today.  Concerns regarding medicines are outlined above.  The following changes have been made:    Labs/tests ordered: Orders Placed This Encounter  Procedures   Lipid Profile   Hepatic function panel   Lipoprotein A (LPA)    Medication Changes: Meds ordered this encounter  Medications   rosuvastatin (CRESTOR) 10 MG tablet    Sig: Take 1 tablet (10 mg total) by mouth daily.    Dispense:  90 tablet    Refill:  3    Current medicines are reviewed at length with the patient today.  The patient does not have concerns regarding medicines.  I spent 32 minutes reviewing all clinical data during and prior to this visit including all relevant imaging studies, laboratories, clinical information from other health systems, and prior notes from both Cardiology and other specialties, interviewing the patient, and conducting a complete physical examination in order to formulate a comprehensive and personalized evaluation and treatment plan.  History of Present Illness:      FOCUSED PROBLEM LIST:   Aortic stenosis S/p TAVR in 05/2020  TTE 05/20/2021: EF 60-65, no RWMA, mild asymmetric LVH, normal RVSF, normal PASP, severe LAE, trivial MR, s/p TAVR-normal structure and function (mean gradient 7), RAP 8 LHC 06/17/2020: Normal coronary arteries Permanent atrial fibrillation  CV 2 score of 6  on Eliquis Tachy-brady syndrome S/p AV node ablation S/p pacemaker implantation  Hypertension  Hyperlipidemia  Intolerant of atorvastatin Carotid artery disease  [place] 07/09/21: RICA  60-79; LICA 1-39  Diabetes mellitus not on insulin Defers SGLT2 inhibitor BMI 28 Aortic atherosclerosis CT abdomen pelvis 2024 CKD stage III  November 2024: The patient is here for routine follow-up.  She was last seen in February.  At that point in  time she was doing well.  There was some discussion about restarting statin but it was not done at that last visit.  The patient is doing well.  She denies any cardiovascular complaints today.  She decided to stop her Farxiga, Amaryl, and metformin due to being concerned about potential side effects.  She does not wish to restart these medications.  She fortunately has not required any emergency room visits or hospitalizations.  She has had no other issues with her medications.  She forgot to take her blood pressure medicine this morning and took it right before she came.  She tells me her blood pressure is usually well-controlled at home.          Current Medications: Current Meds  Medication Sig   acetaminophen (TYLENOL) 500 MG tablet Take 500-1,000 mg by mouth every 8 (eight) hours as needed for moderate pain.   albuterol (VENTOLIN HFA) 108 (90 Base) MCG/ACT inhaler Inhale 2 puffs into the lungs every 4 (four) hours as needed for wheezing or shortness of breath.   amLODipine (NORVASC) 10 MG tablet Take 1 tablet (10 mg total) by mouth daily.   apixaban (ELIQUIS) 5 MG TABS tablet Take 1 tablet (5 mg total) by mouth 2 (two) times daily.   budesonide-formoterol (SYMBICORT) 160-4.5 MCG/ACT inhaler Inhale 2 puffs into the lungs 2 (two) times daily.   meclizine (ANTIVERT) 25 MG tablet Take 12.5 mg by mouth 2 (two) times daily.   Multiple Vitamins-Minerals (MULTIVITAMIN WITH MINERALS) tablet Take 1 tablet by mouth daily.   oxyCODONE-acetaminophen (PERCOCET) 5-325 MG tablet Take 1 tablet by mouth every 6 (six) hours as needed.   rOPINIRole (REQUIP) 4 MG tablet Take 4 mg by mouth at bedtime.   rosuvastatin (CRESTOR) 10 MG tablet Take 1 tablet (10 mg total) by mouth daily.     Review of Systems:   Please see the history of present illness.    All other systems reviewed and are negative.     EKGs/Labs/Other Test Reviewed:   EKG: EKG from April 2024 demonstrates ventricular pacing  EKG  Interpretation Date/Time:    Ventricular Rate:    PR Interval:    QRS Duration:    QT Interval:    QTC Calculation:   R Axis:      Text Interpretation:           Risk Assessment/Calculations:    CHA2DS2-VASc Score = 6   This indicates a 9.7% annual risk of stroke. The patient's score is based upon: CHF History: 1 HTN History: 1 Diabetes History: 1 Stroke History: 0 Vascular Disease History: 1 Age Score: 1 Gender Score: 1         Physical Exam:   VS:  BP (!) 140/70   Pulse 62   Resp 16   Ht 5\' 8"  (1.727 m)   Wt 208 lb (94.3 kg)   SpO2 95%   BMI 31.63 kg/m    HYPERTENSION CONTROL Vitals:   12/07/22 1318 12/07/22 1341  BP: (!) 148/86 (!) 140/70    The patient's blood pressure is elevated above target today.  In order to address the patient's elevated BP: Blood pressure will be monitored at home to determine if medication changes need to be made.  Wt Readings from Last 3 Encounters:  12/07/22 208 lb (94.3 kg)  12/03/22 190 lb (86.2 kg)  05/22/22 200 lb (90.7 kg)      GENERAL:  No apparent distress, AOx3 HEENT:  No carotid bruits, +2 carotid impulses, no scleral icterus CAR: RRR no murmurs, gallops, rubs, or thrills RES:  Clear to auscultation bilaterally ABD:  Soft, nontender, nondistended, positive bowel sounds x 4 VASC:  +2 radial pulses, +2 carotid pulses NEURO:  CN 2-12 grossly intact; motor and sensory grossly intact PSYCH:  No active depression or anxiety EXT:  No edema, ecchymosis, or cyanosis  Signed, Orbie Pyo, MD  12/07/2022 2:02 PM    [facility] [address] [place], [place], [place]  [address] Phone: [phone]; Fax: [phone]   Note:  This document was prepared using Dragon voice recognition software and may include unintentional dictation errors.

## 2022-12-07 ENCOUNTER — Ambulatory Visit: Payer: Medicare HMO | Attending: Internal Medicine | Admitting: Internal Medicine

## 2022-12-07 ENCOUNTER — Encounter: Payer: Self-pay | Admitting: Internal Medicine

## 2022-12-07 VITALS — BP 140/70 | HR 62 | Resp 16 | Ht 68.0 in | Wt 208.0 lb

## 2022-12-07 DIAGNOSIS — D6869 Other thrombophilia: Secondary | ICD-10-CM | POA: Diagnosis not present

## 2022-12-07 DIAGNOSIS — I152 Hypertension secondary to endocrine disorders: Secondary | ICD-10-CM

## 2022-12-07 DIAGNOSIS — E1159 Type 2 diabetes mellitus with other circulatory complications: Secondary | ICD-10-CM

## 2022-12-07 DIAGNOSIS — N1831 Chronic kidney disease, stage 3a: Secondary | ICD-10-CM | POA: Diagnosis not present

## 2022-12-07 DIAGNOSIS — E785 Hyperlipidemia, unspecified: Secondary | ICD-10-CM

## 2022-12-07 DIAGNOSIS — I4819 Other persistent atrial fibrillation: Secondary | ICD-10-CM | POA: Diagnosis not present

## 2022-12-07 DIAGNOSIS — E118 Type 2 diabetes mellitus with unspecified complications: Secondary | ICD-10-CM | POA: Diagnosis not present

## 2022-12-07 DIAGNOSIS — I779 Disorder of arteries and arterioles, unspecified: Secondary | ICD-10-CM

## 2022-12-07 DIAGNOSIS — I700 Atherosclerosis of aorta: Secondary | ICD-10-CM

## 2022-12-07 DIAGNOSIS — Z6828 Body mass index (BMI) 28.0-28.9, adult: Secondary | ICD-10-CM

## 2022-12-07 DIAGNOSIS — Z950 Presence of cardiac pacemaker: Secondary | ICD-10-CM

## 2022-12-07 DIAGNOSIS — Z952 Presence of prosthetic heart valve: Secondary | ICD-10-CM

## 2022-12-07 DIAGNOSIS — E1169 Type 2 diabetes mellitus with other specified complication: Secondary | ICD-10-CM | POA: Diagnosis not present

## 2022-12-07 MED ORDER — ROSUVASTATIN CALCIUM 10 MG PO TABS: 10.0000 mg | ORAL_TABLET | Freq: Every day | ORAL | 3 refills | Status: DC

## 2022-12-07 NOTE — Patient Instructions (Signed)
Medication Instructions:  Your physician has recommended you make the following change in your medication:  1.) start rosuvastatin (Crestor) 10 mg - take one tablet daily  *If you need a refill on your cardiac medications before your next appointment, please call your pharmacy*   Lab Work: Please return in about 2 months for blood work (lipids, liver function, Lpa)  If you have labs (blood work) drawn today and your tests are completely normal, you will receive your results only by: MyChart Message (if you have MyChart) OR A paper copy in the mail If you have any lab test that is abnormal or we need to change your treatment, we will call you to review the results.   Testing/Procedures: none   Follow-Up: At [facility], you and your health needs are our priority.  As part of our continuing mission to provide you with exceptional heart care, we have created designated Provider Care Teams.  These Care Teams include your primary Cardiologist (physician) and Advanced Practice Providers (APPs -  Physician Assistants and Nurse Practitioners) who all work together to provide you with the care you need, when you need it.   Your next appointment:   6 month(s)  Provider:   Tereso Newcomer, PA-C

## 2022-12-08 ENCOUNTER — Other Ambulatory Visit: Payer: Self-pay | Admitting: *Deleted

## 2022-12-08 DIAGNOSIS — I700 Atherosclerosis of aorta: Secondary | ICD-10-CM

## 2022-12-08 DIAGNOSIS — E1169 Type 2 diabetes mellitus with other specified complication: Secondary | ICD-10-CM

## 2022-12-09 ENCOUNTER — Other Ambulatory Visit: Payer: Self-pay | Admitting: Internal Medicine

## 2022-12-09 DIAGNOSIS — E1140 Type 2 diabetes mellitus with diabetic neuropathy, unspecified: Secondary | ICD-10-CM | POA: Diagnosis not present

## 2022-12-09 DIAGNOSIS — E1165 Type 2 diabetes mellitus with hyperglycemia: Secondary | ICD-10-CM | POA: Diagnosis not present

## 2022-12-09 DIAGNOSIS — Z23 Encounter for immunization: Secondary | ICD-10-CM | POA: Diagnosis not present

## 2022-12-09 DIAGNOSIS — I10 Essential (primary) hypertension: Secondary | ICD-10-CM | POA: Diagnosis not present

## 2022-12-09 DIAGNOSIS — Z0000 Encounter for general adult medical examination without abnormal findings: Secondary | ICD-10-CM | POA: Diagnosis not present

## 2022-12-09 DIAGNOSIS — Z1231 Encounter for screening mammogram for malignant neoplasm of breast: Secondary | ICD-10-CM

## 2022-12-09 DIAGNOSIS — D6869 Other thrombophilia: Secondary | ICD-10-CM | POA: Diagnosis not present

## 2022-12-09 DIAGNOSIS — E538 Deficiency of other specified B group vitamins: Secondary | ICD-10-CM | POA: Diagnosis not present

## 2022-12-09 DIAGNOSIS — I5032 Chronic diastolic (congestive) heart failure: Secondary | ICD-10-CM | POA: Diagnosis not present

## 2022-12-09 DIAGNOSIS — I110 Hypertensive heart disease with heart failure: Secondary | ICD-10-CM | POA: Diagnosis not present

## 2022-12-09 DIAGNOSIS — M4606 Spinal enthesopathy, lumbar region: Secondary | ICD-10-CM | POA: Diagnosis not present

## 2022-12-09 DIAGNOSIS — E782 Mixed hyperlipidemia: Secondary | ICD-10-CM | POA: Diagnosis not present

## 2022-12-09 DIAGNOSIS — J449 Chronic obstructive pulmonary disease, unspecified: Secondary | ICD-10-CM | POA: Diagnosis not present

## 2022-12-10 DIAGNOSIS — E1165 Type 2 diabetes mellitus with hyperglycemia: Secondary | ICD-10-CM | POA: Diagnosis not present

## 2022-12-13 DIAGNOSIS — M6281 Muscle weakness (generalized): Secondary | ICD-10-CM | POA: Diagnosis not present

## 2022-12-13 DIAGNOSIS — M25561 Pain in right knee: Secondary | ICD-10-CM | POA: Diagnosis not present

## 2022-12-13 DIAGNOSIS — M25562 Pain in left knee: Secondary | ICD-10-CM | POA: Diagnosis not present

## 2022-12-15 DIAGNOSIS — M25561 Pain in right knee: Secondary | ICD-10-CM | POA: Diagnosis not present

## 2022-12-15 DIAGNOSIS — M6281 Muscle weakness (generalized): Secondary | ICD-10-CM | POA: Diagnosis not present

## 2022-12-15 DIAGNOSIS — M25562 Pain in left knee: Secondary | ICD-10-CM | POA: Diagnosis not present

## 2022-12-19 NOTE — Progress Notes (Signed)
Remote pacemaker transmission.   

## 2022-12-21 DIAGNOSIS — M25561 Pain in right knee: Secondary | ICD-10-CM | POA: Diagnosis not present

## 2022-12-21 DIAGNOSIS — M25562 Pain in left knee: Secondary | ICD-10-CM | POA: Diagnosis not present

## 2022-12-21 DIAGNOSIS — M6281 Muscle weakness (generalized): Secondary | ICD-10-CM | POA: Diagnosis not present

## 2023-01-10 DIAGNOSIS — E119 Type 2 diabetes mellitus without complications: Secondary | ICD-10-CM | POA: Diagnosis not present

## 2023-01-10 DIAGNOSIS — R413 Other amnesia: Secondary | ICD-10-CM | POA: Diagnosis not present

## 2023-01-10 DIAGNOSIS — B379 Candidiasis, unspecified: Secondary | ICD-10-CM | POA: Diagnosis not present

## 2023-01-10 DIAGNOSIS — I10 Essential (primary) hypertension: Secondary | ICD-10-CM | POA: Diagnosis not present

## 2023-02-20 ENCOUNTER — Ambulatory Visit: Payer: Medicare Other

## 2023-02-27 ENCOUNTER — Ambulatory Visit (INDEPENDENT_AMBULATORY_CARE_PROVIDER_SITE_OTHER): Payer: Medicare Other

## 2023-02-27 DIAGNOSIS — I4819 Other persistent atrial fibrillation: Secondary | ICD-10-CM

## 2023-02-27 LAB — CUP PACEART REMOTE DEVICE CHECK
Battery Remaining Longevity: 121 mo
Battery Remaining Percentage: 86 %
Battery Voltage: 3.04 V
Brady Statistic RV Percent Paced: 98 %
Date Time Interrogation Session: 20250127020013
Implantable Lead Connection Status: 753985
Implantable Lead Implant Date: 20220928
Implantable Lead Location: 753860
Implantable Pulse Generator Implant Date: 20220928
Lead Channel Impedance Value: 460 Ohm
Lead Channel Pacing Threshold Amplitude: 0.625 V
Lead Channel Pacing Threshold Pulse Width: 0.4 ms
Lead Channel Sensing Intrinsic Amplitude: 9.7 mV
Lead Channel Setting Pacing Amplitude: 0.875
Lead Channel Setting Pacing Pulse Width: 0.4 ms
Lead Channel Setting Sensing Sensitivity: 2 mV
Pulse Gen Model: 1272
Pulse Gen Serial Number: 3937998

## 2023-02-28 ENCOUNTER — Encounter: Payer: Self-pay | Admitting: Cardiology

## 2023-03-05 DIAGNOSIS — I5032 Chronic diastolic (congestive) heart failure: Secondary | ICD-10-CM | POA: Diagnosis not present

## 2023-03-05 DIAGNOSIS — E1159 Type 2 diabetes mellitus with other circulatory complications: Secondary | ICD-10-CM | POA: Diagnosis not present

## 2023-03-05 DIAGNOSIS — I152 Hypertension secondary to endocrine disorders: Secondary | ICD-10-CM | POA: Diagnosis not present

## 2023-03-05 DIAGNOSIS — E1165 Type 2 diabetes mellitus with hyperglycemia: Secondary | ICD-10-CM | POA: Diagnosis not present

## 2023-03-05 DIAGNOSIS — E1169 Type 2 diabetes mellitus with other specified complication: Secondary | ICD-10-CM | POA: Diagnosis not present

## 2023-03-05 DIAGNOSIS — F331 Major depressive disorder, recurrent, moderate: Secondary | ICD-10-CM | POA: Diagnosis not present

## 2023-03-05 DIAGNOSIS — F03A4 Unspecified dementia, mild, with anxiety: Secondary | ICD-10-CM | POA: Diagnosis not present

## 2023-03-05 DIAGNOSIS — F03A3 Unspecified dementia, mild, with mood disturbance: Secondary | ICD-10-CM | POA: Diagnosis not present

## 2023-03-05 DIAGNOSIS — E782 Mixed hyperlipidemia: Secondary | ICD-10-CM | POA: Diagnosis not present

## 2023-03-08 DIAGNOSIS — I5032 Chronic diastolic (congestive) heart failure: Secondary | ICD-10-CM | POA: Diagnosis not present

## 2023-03-08 DIAGNOSIS — F03A3 Unspecified dementia, mild, with mood disturbance: Secondary | ICD-10-CM | POA: Diagnosis not present

## 2023-03-08 DIAGNOSIS — E1159 Type 2 diabetes mellitus with other circulatory complications: Secondary | ICD-10-CM | POA: Diagnosis not present

## 2023-03-08 DIAGNOSIS — E782 Mixed hyperlipidemia: Secondary | ICD-10-CM | POA: Diagnosis not present

## 2023-03-08 DIAGNOSIS — E1165 Type 2 diabetes mellitus with hyperglycemia: Secondary | ICD-10-CM | POA: Diagnosis not present

## 2023-03-08 DIAGNOSIS — E1169 Type 2 diabetes mellitus with other specified complication: Secondary | ICD-10-CM | POA: Diagnosis not present

## 2023-03-08 DIAGNOSIS — F03A4 Unspecified dementia, mild, with anxiety: Secondary | ICD-10-CM | POA: Diagnosis not present

## 2023-03-08 DIAGNOSIS — F331 Major depressive disorder, recurrent, moderate: Secondary | ICD-10-CM | POA: Diagnosis not present

## 2023-03-08 DIAGNOSIS — I152 Hypertension secondary to endocrine disorders: Secondary | ICD-10-CM | POA: Diagnosis not present

## 2023-03-13 ENCOUNTER — Other Ambulatory Visit (HOSPITAL_COMMUNITY): Payer: Self-pay

## 2023-03-13 ENCOUNTER — Telehealth: Payer: Self-pay

## 2023-03-13 NOTE — Telephone Encounter (Signed)
 Pharmacy Patient Advocate Encounter   Received notification from CoverMyMeds that prior authorization for ELIQUIS  is required/requested.   Insurance verification completed.   The patient is insured through [employer] .   Per test claim: Refill too soon. PA is not needed at this time. Medication was filled 03/08/23. Next eligible fill date is 03/31/23.

## 2023-03-15 DIAGNOSIS — I5032 Chronic diastolic (congestive) heart failure: Secondary | ICD-10-CM | POA: Diagnosis not present

## 2023-03-15 DIAGNOSIS — E782 Mixed hyperlipidemia: Secondary | ICD-10-CM | POA: Diagnosis not present

## 2023-03-15 DIAGNOSIS — E1159 Type 2 diabetes mellitus with other circulatory complications: Secondary | ICD-10-CM | POA: Diagnosis not present

## 2023-03-15 DIAGNOSIS — I152 Hypertension secondary to endocrine disorders: Secondary | ICD-10-CM | POA: Diagnosis not present

## 2023-03-15 DIAGNOSIS — E1165 Type 2 diabetes mellitus with hyperglycemia: Secondary | ICD-10-CM | POA: Diagnosis not present

## 2023-03-15 DIAGNOSIS — F03A4 Unspecified dementia, mild, with anxiety: Secondary | ICD-10-CM | POA: Diagnosis not present

## 2023-03-15 DIAGNOSIS — F331 Major depressive disorder, recurrent, moderate: Secondary | ICD-10-CM | POA: Diagnosis not present

## 2023-03-15 DIAGNOSIS — F03A3 Unspecified dementia, mild, with mood disturbance: Secondary | ICD-10-CM | POA: Diagnosis not present

## 2023-03-15 DIAGNOSIS — E1169 Type 2 diabetes mellitus with other specified complication: Secondary | ICD-10-CM | POA: Diagnosis not present

## 2023-03-22 DIAGNOSIS — E1169 Type 2 diabetes mellitus with other specified complication: Secondary | ICD-10-CM | POA: Diagnosis not present

## 2023-03-22 DIAGNOSIS — F03A4 Unspecified dementia, mild, with anxiety: Secondary | ICD-10-CM | POA: Diagnosis not present

## 2023-03-22 DIAGNOSIS — I5032 Chronic diastolic (congestive) heart failure: Secondary | ICD-10-CM | POA: Diagnosis not present

## 2023-03-22 DIAGNOSIS — F331 Major depressive disorder, recurrent, moderate: Secondary | ICD-10-CM | POA: Diagnosis not present

## 2023-03-22 DIAGNOSIS — I152 Hypertension secondary to endocrine disorders: Secondary | ICD-10-CM | POA: Diagnosis not present

## 2023-03-22 DIAGNOSIS — E782 Mixed hyperlipidemia: Secondary | ICD-10-CM | POA: Diagnosis not present

## 2023-03-22 DIAGNOSIS — F03A3 Unspecified dementia, mild, with mood disturbance: Secondary | ICD-10-CM | POA: Diagnosis not present

## 2023-03-22 DIAGNOSIS — E1159 Type 2 diabetes mellitus with other circulatory complications: Secondary | ICD-10-CM | POA: Diagnosis not present

## 2023-03-22 DIAGNOSIS — E1165 Type 2 diabetes mellitus with hyperglycemia: Secondary | ICD-10-CM | POA: Diagnosis not present

## 2023-03-27 ENCOUNTER — Telehealth: Payer: Self-pay | Admitting: Internal Medicine

## 2023-03-27 DIAGNOSIS — I152 Hypertension secondary to endocrine disorders: Secondary | ICD-10-CM | POA: Diagnosis not present

## 2023-03-27 DIAGNOSIS — F03A4 Unspecified dementia, mild, with anxiety: Secondary | ICD-10-CM | POA: Diagnosis not present

## 2023-03-27 DIAGNOSIS — E1159 Type 2 diabetes mellitus with other circulatory complications: Secondary | ICD-10-CM | POA: Diagnosis not present

## 2023-03-27 DIAGNOSIS — E782 Mixed hyperlipidemia: Secondary | ICD-10-CM | POA: Diagnosis not present

## 2023-03-27 DIAGNOSIS — F03A3 Unspecified dementia, mild, with mood disturbance: Secondary | ICD-10-CM | POA: Diagnosis not present

## 2023-03-27 DIAGNOSIS — F331 Major depressive disorder, recurrent, moderate: Secondary | ICD-10-CM | POA: Diagnosis not present

## 2023-03-27 DIAGNOSIS — I5032 Chronic diastolic (congestive) heart failure: Secondary | ICD-10-CM | POA: Diagnosis not present

## 2023-03-27 DIAGNOSIS — E1165 Type 2 diabetes mellitus with hyperglycemia: Secondary | ICD-10-CM | POA: Diagnosis not present

## 2023-03-27 DIAGNOSIS — E1169 Type 2 diabetes mellitus with other specified complication: Secondary | ICD-10-CM | POA: Diagnosis not present

## 2023-03-27 NOTE — Telephone Encounter (Signed)
 Dois Davenport, RN from [facility] is calling wanting to know if will auth a Dexcom for the patient as she is diabetic.

## 2023-03-27 NOTE — Telephone Encounter (Signed)
 Spoke with Melina Copa form home health and she would like to get patient a Dexcom for her diabetes.  She would like to know if you would authorize for patient to have Dexcom.   I did advise to reach out to PCP as well. She stated she have and its been over a week with not responnse

## 2023-03-28 NOTE — Telephone Encounter (Signed)
 Left voicemail to return call to office

## 2023-04-04 DIAGNOSIS — M222X1 Patellofemoral disorders, right knee: Secondary | ICD-10-CM | POA: Diagnosis not present

## 2023-04-10 NOTE — Progress Notes (Signed)
 Remote pacemaker transmission.

## 2023-04-11 NOTE — Telephone Encounter (Signed)
Routed call to PCP office.

## 2023-04-13 ENCOUNTER — Observation Stay (HOSPITAL_COMMUNITY)

## 2023-04-13 ENCOUNTER — Other Ambulatory Visit: Payer: Self-pay

## 2023-04-13 ENCOUNTER — Emergency Department (HOSPITAL_COMMUNITY)

## 2023-04-13 ENCOUNTER — Observation Stay (HOSPITAL_COMMUNITY)
Admission: EM | Admit: 2023-04-13 | Discharge: 2023-04-15 | Disposition: A | Discharge status: Home / Self Care | Attending: Family Medicine | Admitting: Family Medicine

## 2023-04-13 ENCOUNTER — Encounter (HOSPITAL_COMMUNITY): Payer: Self-pay | Admitting: Internal Medicine

## 2023-04-13 DIAGNOSIS — G2581 Restless legs syndrome: Secondary | ICD-10-CM | POA: Insufficient documentation

## 2023-04-13 DIAGNOSIS — I10 Essential (primary) hypertension: Secondary | ICD-10-CM | POA: Diagnosis not present

## 2023-04-13 DIAGNOSIS — R109 Unspecified abdominal pain: Secondary | ICD-10-CM | POA: Diagnosis not present

## 2023-04-13 DIAGNOSIS — F0390 Unspecified dementia without behavioral disturbance: Secondary | ICD-10-CM | POA: Insufficient documentation

## 2023-04-13 DIAGNOSIS — Z952 Presence of prosthetic heart valve: Secondary | ICD-10-CM | POA: Insufficient documentation

## 2023-04-13 DIAGNOSIS — I350 Nonrheumatic aortic (valve) stenosis: Secondary | ICD-10-CM | POA: Diagnosis not present

## 2023-04-13 DIAGNOSIS — Z7984 Long term (current) use of oral hypoglycemic drugs: Secondary | ICD-10-CM | POA: Insufficient documentation

## 2023-04-13 DIAGNOSIS — N179 Acute kidney failure, unspecified: Secondary | ICD-10-CM | POA: Diagnosis present

## 2023-04-13 DIAGNOSIS — N200 Calculus of kidney: Secondary | ICD-10-CM

## 2023-04-13 DIAGNOSIS — I2510 Atherosclerotic heart disease of native coronary artery without angina pectoris: Secondary | ICD-10-CM | POA: Diagnosis not present

## 2023-04-13 DIAGNOSIS — R7989 Other specified abnormal findings of blood chemistry: Secondary | ICD-10-CM | POA: Diagnosis not present

## 2023-04-13 DIAGNOSIS — E1165 Type 2 diabetes mellitus with hyperglycemia: Secondary | ICD-10-CM | POA: Diagnosis not present

## 2023-04-13 DIAGNOSIS — Z7901 Long term (current) use of anticoagulants: Secondary | ICD-10-CM | POA: Diagnosis not present

## 2023-04-13 DIAGNOSIS — R0689 Other abnormalities of breathing: Secondary | ICD-10-CM | POA: Diagnosis not present

## 2023-04-13 DIAGNOSIS — I4819 Other persistent atrial fibrillation: Secondary | ICD-10-CM | POA: Diagnosis not present

## 2023-04-13 DIAGNOSIS — Z79899 Other long term (current) drug therapy: Secondary | ICD-10-CM | POA: Insufficient documentation

## 2023-04-13 DIAGNOSIS — G9341 Metabolic encephalopathy: Secondary | ICD-10-CM | POA: Diagnosis not present

## 2023-04-13 DIAGNOSIS — N132 Hydronephrosis with renal and ureteral calculous obstruction: Secondary | ICD-10-CM | POA: Diagnosis not present

## 2023-04-13 DIAGNOSIS — R778 Other specified abnormalities of plasma proteins: Secondary | ICD-10-CM | POA: Diagnosis not present

## 2023-04-13 DIAGNOSIS — Q446 Cystic disease of liver: Secondary | ICD-10-CM | POA: Diagnosis not present

## 2023-04-13 DIAGNOSIS — E785 Hyperlipidemia, unspecified: Secondary | ICD-10-CM | POA: Diagnosis not present

## 2023-04-13 DIAGNOSIS — R4182 Altered mental status, unspecified: Secondary | ICD-10-CM | POA: Diagnosis not present

## 2023-04-13 LAB — URINALYSIS, ROUTINE W REFLEX MICROSCOPIC
Bacteria, UA: NONE SEEN
Bilirubin Urine: NEGATIVE
Glucose, UA: >=500 mg/dL — AB
Ketones, ur: 5 mg/dL — AB
Leukocytes,Ua: NEGATIVE
Nitrite: NEGATIVE
Protein, ur: 30 mg/dL — AB
Specific Gravity, Urine: 1.022 (ref 1.005–1.030)
pH: 5 (ref 5.0–8.0)

## 2023-04-13 LAB — CBC WITH DIFFERENTIAL/PLATELET
Abs Immature Granulocytes: 0.07 10*3/uL (ref 0.00–0.07)
Basophils Absolute: 0.1 10*3/uL (ref 0.0–0.1)
Basophils Relative: 1 %
Eosinophils Absolute: 0 10*3/uL (ref 0.0–0.5)
Eosinophils Relative: 0 %
HCT: 41.8 % (ref 36.0–46.0)
Hemoglobin: 14 g/dL (ref 12.0–15.0)
Immature Granulocytes: 1 %
Lymphocytes Relative: 14 %
Lymphs Abs: 1.5 10*3/uL (ref 0.7–4.0)
MCH: 31.3 pg (ref 26.0–34.0)
MCHC: 33.5 g/dL (ref 30.0–36.0)
MCV: 93.5 fL (ref 80.0–100.0)
Monocytes Absolute: 0.9 10*3/uL (ref 0.1–1.0)
Monocytes Relative: 8 %
Neutro Abs: 8.5 10*3/uL — ABNORMAL HIGH (ref 1.7–7.7)
Neutrophils Relative %: 76 %
Platelets: 164 10*3/uL (ref 150–400)
RBC: 4.47 MIL/uL (ref 3.87–5.11)
RDW: 13.2 % (ref 11.5–15.5)
WBC: 11.1 10*3/uL — ABNORMAL HIGH (ref 4.0–10.5)
nRBC: 0 % (ref 0.0–0.2)

## 2023-04-13 LAB — COMPREHENSIVE METABOLIC PANEL
ALT: 21 U/L (ref 0–44)
AST: 23 U/L (ref 15–41)
Albumin: 3.9 g/dL (ref 3.5–5.0)
Alkaline Phosphatase: 49 U/L (ref 38–126)
Anion gap: 13 (ref 5–15)
BUN: 16 mg/dL (ref 8–23)
CO2: 17 mmol/L — ABNORMAL LOW (ref 22–32)
Calcium: 9.1 mg/dL (ref 8.9–10.3)
Chloride: 109 mmol/L (ref 98–111)
Creatinine, Ser: 1 mg/dL (ref 0.44–1.00)
GFR, Estimated: 59 mL/min — ABNORMAL LOW (ref >60)
Glucose, Bld: 246 mg/dL — ABNORMAL HIGH (ref 70–99)
Potassium: 3.9 mmol/L (ref 3.5–5.1)
Sodium: 139 mmol/L (ref 135–145)
Total Bilirubin: 1 mg/dL (ref 0.0–1.2)
Total Protein: 6.6 g/dL (ref 6.5–8.1)

## 2023-04-13 LAB — LIPASE, BLOOD: Lipase: 29 U/L (ref 11–51)

## 2023-04-13 LAB — HEMOGLOBIN A1C
Hgb A1c MFr Bld: 6.9 % — ABNORMAL HIGH (ref 4.8–5.6)
Mean Plasma Glucose: 151.33 mg/dL

## 2023-04-13 LAB — GLUCOSE, CAPILLARY
Glucose-Capillary: 105 mg/dL — ABNORMAL HIGH (ref 70–99)
Glucose-Capillary: 183 mg/dL — ABNORMAL HIGH (ref 70–99)

## 2023-04-13 LAB — HEPARIN LEVEL (UNFRACTIONATED): Heparin Unfractionated: 0.58 [IU]/mL (ref 0.30–0.70)

## 2023-04-13 LAB — APTT
aPTT: 32 s (ref 24–36)
aPTT: 53 s — ABNORMAL HIGH (ref 24–36)

## 2023-04-13 LAB — TROPONIN I (HIGH SENSITIVITY)
Troponin I (High Sensitivity): 51 ng/L — ABNORMAL HIGH (ref <18)
Troponin I (High Sensitivity): 104 ng/L (ref <18)

## 2023-04-13 MED ORDER — TAMSULOSIN HCL 0.4 MG PO CAPS
0.4000 mg | ORAL_CAPSULE | Freq: Once | ORAL | Status: AC
  Administered 2023-04-13: 0.4 mg via ORAL
  Filled 2023-04-13: qty 1

## 2023-04-13 MED ORDER — NALOXONE HCL 0.4 MG/ML IJ SOLN: 0.4000 mg | INTRAMUSCULAR | Status: DC | PRN

## 2023-04-13 MED ORDER — OXYCODONE-ACETAMINOPHEN 5-325 MG PO TABS
1.0000 | ORAL_TABLET | Freq: Once | ORAL | Status: AC
  Administered 2023-04-13: 1 via ORAL
  Filled 2023-04-13: qty 1

## 2023-04-13 MED ORDER — OXYCODONE HCL 5 MG PO TABS
5.0000 mg | ORAL_TABLET | Freq: Once | ORAL | Status: AC | PRN
  Administered 2023-04-13: 5 mg via ORAL
  Filled 2023-04-13: qty 1

## 2023-04-13 MED ORDER — PROCHLORPERAZINE EDISYLATE 10 MG/2ML IJ SOLN
10.0000 mg | Freq: Once | INTRAMUSCULAR | Status: AC
  Administered 2023-04-13: 10 mg via INTRAVENOUS
  Filled 2023-04-13: qty 2

## 2023-04-13 MED ORDER — ROPINIROLE HCL 0.5 MG PO TABS
2.0000 mg | ORAL_TABLET | Freq: Two times a day (BID) | ORAL | Status: DC
  Administered 2023-04-13 – 2023-04-15 (×4): 2 mg via ORAL
  Filled 2023-04-13 (×4): qty 4

## 2023-04-13 MED ORDER — LACTATED RINGERS IV BOLUS
1000.0000 mL | Freq: Once | INTRAVENOUS | Status: AC
  Administered 2023-04-13: 1000 mL via INTRAVENOUS

## 2023-04-13 MED ORDER — ACETAMINOPHEN 650 MG RE SUPP: 650.0000 mg | Freq: Four times a day (QID) | RECTAL | Status: DC | PRN

## 2023-04-13 MED ORDER — LACTATED RINGERS IV BOLUS: 250.0000 mL | Freq: Once | INTRAVENOUS | Status: DC

## 2023-04-13 MED ORDER — ALBUTEROL SULFATE (2.5 MG/3ML) 0.083% IN NEBU: 2.5000 mg | INHALATION_SOLUTION | Freq: Four times a day (QID) | RESPIRATORY_TRACT | Status: DC | PRN

## 2023-04-13 MED ORDER — ASPIRIN 81 MG PO CHEW
324.0000 mg | CHEWABLE_TABLET | Freq: Once | ORAL | Status: AC
  Administered 2023-04-13: 324 mg via ORAL
  Filled 2023-04-13: qty 4

## 2023-04-13 MED ORDER — ACETAMINOPHEN 325 MG PO TABS
650.0000 mg | ORAL_TABLET | Freq: Four times a day (QID) | ORAL | Status: DC | PRN
  Administered 2023-04-13 – 2023-04-14 (×2): 650 mg via ORAL
  Filled 2023-04-13 (×2): qty 2

## 2023-04-13 MED ORDER — BISACODYL 5 MG PO TBEC: 5.0000 mg | DELAYED_RELEASE_TABLET | Freq: Every day | ORAL | Status: DC | PRN

## 2023-04-13 MED ORDER — ROSUVASTATIN CALCIUM 5 MG PO TABS
10.0000 mg | ORAL_TABLET | Freq: Every day | ORAL | Status: DC
  Administered 2023-04-14 – 2023-04-15 (×2): 10 mg via ORAL
  Filled 2023-04-13 (×2): qty 2

## 2023-04-13 MED ORDER — FENTANYL CITRATE PF 50 MCG/ML IJ SOSY
50.0000 ug | PREFILLED_SYRINGE | Freq: Once | INTRAMUSCULAR | Status: AC
  Administered 2023-04-13: 50 ug via INTRAVENOUS
  Filled 2023-04-13: qty 1

## 2023-04-13 MED ORDER — MEMANTINE HCL 10 MG PO TABS
5.0000 mg | ORAL_TABLET | Freq: Two times a day (BID) | ORAL | Status: DC
  Administered 2023-04-13 – 2023-04-15 (×4): 5 mg via ORAL
  Filled 2023-04-13 (×4): qty 1

## 2023-04-13 MED ORDER — AMLODIPINE BESYLATE 10 MG PO TABS
10.0000 mg | ORAL_TABLET | Freq: Every day | ORAL | Status: DC
  Administered 2023-04-14 – 2023-04-15 (×2): 10 mg via ORAL
  Filled 2023-04-13 (×2): qty 1

## 2023-04-13 MED ORDER — MORPHINE SULFATE (PF) 2 MG/ML IV SOLN
1.0000 mg | INTRAVENOUS | Status: DC | PRN
  Administered 2023-04-13 – 2023-04-14 (×3): 1 mg via INTRAVENOUS
  Filled 2023-04-13 (×3): qty 1

## 2023-04-13 MED ORDER — HYDRALAZINE HCL 20 MG/ML IJ SOLN
10.0000 mg | Freq: Four times a day (QID) | INTRAMUSCULAR | Status: DC | PRN
  Administered 2023-04-14: 10 mg via INTRAVENOUS
  Filled 2023-04-13: qty 1

## 2023-04-13 MED ORDER — INSULIN ASPART 100 UNIT/ML IJ SOLN: 0.0000 [IU] | Freq: Every day | INTRAMUSCULAR | Status: DC

## 2023-04-13 MED ORDER — INSULIN ASPART 100 UNIT/ML IJ SOLN
0.0000 [IU] | Freq: Three times a day (TID) | INTRAMUSCULAR | Status: DC
  Administered 2023-04-15: 1 [IU] via SUBCUTANEOUS

## 2023-04-13 MED ORDER — TRIMETHOBENZAMIDE HCL 100 MG/ML IM SOLN: 200.0000 mg | Freq: Once | INTRAMUSCULAR | Status: DC | PRN

## 2023-04-13 MED ORDER — HEPARIN (PORCINE) 25000 UT/250ML-% IV SOLN
1250.0000 [IU]/h | INTRAVENOUS | Status: DC
  Administered 2023-04-13: 1000 [IU]/h via INTRAVENOUS
  Filled 2023-04-13: qty 250

## 2023-04-13 MED ORDER — POLYETHYLENE GLYCOL 3350 17 G PO PACK: 17.0000 g | PACK | Freq: Every day | ORAL | Status: DC | PRN

## 2023-04-13 MED ORDER — KETOROLAC TROMETHAMINE 15 MG/ML IJ SOLN
15.0000 mg | Freq: Once | INTRAMUSCULAR | Status: AC
  Administered 2023-04-13: 15 mg via INTRAVENOUS
  Filled 2023-04-13: qty 1

## 2023-04-13 NOTE — ED Notes (Signed)
 Patient transported to CT

## 2023-04-13 NOTE — Progress Notes (Signed)
 PHARMACY - ANTICOAGULATION CONSULT NOTE  Pharmacy Consult for UFH IV Infusion Indication: chest pain/ACS  Allergies  Allergen Reactions   Bee Venom Shortness Of Breath and Rash   Claritin [Loratadine] Itching and Palpitations   Wasp Venom Anaphylaxis   Apricot Flavoring Agent (Non-Screening) Swelling    Eyes swell shut   Atorvastatin Itching    Eye swelling Other reaction(s): HA   Losartan Itching and Rash    Patient Measurements: Height: 5\' 8"  (172.7 cm) Weight: 83.9 kg (185 lb) IBW/kg (Calculated) : 63.9 Heparin Dosing Weight: 83.9 kg  Vital Signs: Temp: 97.6 F (36.4 C) (03/13 1200) Temp Source: Oral (03/13 1050) BP: 126/59 (03/13 1200) Pulse Rate: 63 (03/13 1200)  Labs: Recent Labs    04/13/23 0658 04/13/23 0834 04/13/23 1028  HGB 14.0  --   --   HCT 41.8  --   --   PLT 164  --   --   CREATININE 1.00  --   --   TROPONINIHS  --  51* 104*    Estimated Creatinine Clearance: 55.2 mL/min (by C-G formula based on SCr of 1 mg/dL).   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 Clifton James & Dr. Cornelius Moras   Severe aortic stenosis    Type 2 diabetes mellitus without complication, without long-term current use of insulin (HCC) 04/10/2018   Vitamin D deficiency     Medications:  Scheduled:   aspirin  324 mg Oral Once   insulin aspart  0-5 Units Subcutaneous QHS   insulin  aspart  0-9 Units Subcutaneous TID WC   Infusions:   heparin      Assessment: 77 YOF PMH DM2, HTN, HLD, severe aortic stenosis s/p TAVR, carotid aretery disease, AF on Eliquis. ED visit 04/13/22 with abd/flank pain for a few hours prior to arrival with 3-4 episodes of emesis. History of kidney stones with reported similar symptoms. Troponins were rising (51 >104). Cardiology consulted and started on UFH infusion + ASA 324 x1. CBC stable. No signs of bleeding. Unable to determine last dose of Eliquis. Will start UFH now with concerns for ACS.  Goal of Therapy:  Heparin level 0.3-0.7 units/ml aPTT 66-102 seconds Monitor platelets by anticoagulation protocol: Yes   Plan:  Start heparin infusion at 1000 units/hr Check aPTT and anti-Xa level in 6 hours and daily while on heparin Continue to monitor H&H and platelets  Wilmer Floor 04/13/2023,12:29 PM

## 2023-04-13 NOTE — Consult Note (Addendum)
 Cardiology Consultation   Patient ID: Briana Foster MRN: [id number]; DOB: 25-Mar-1945  Admit date: 04/13/2023 Date of Consult: 04/13/2023  PCP:  Georgann Housekeeper, MD   [facility] Providers Cardiologist:  Orbie Pyo, MD  Electrophysiologist:  Regan Lemming, MD       Patient Profile:   Briana Foster is a 78 y.o. 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  History of Present Illness:   Patient has history noted above.  The patient is followed by Dr. Lynnette Caffey and Dr. Elberta Fortis. The patient was initially seen on 06/2019 for severe aortic stenosis and was found to have A-fib with RVR.  The patient had right heart cath on 05/2020 as part of TAVR workup.  It showed widely patent coronary arteries that were torturous without any significant obstruction.  Patient was initially treated with IV metoprolol.  Amiodarone was started for rate control but was discontinued due to nausea.  An echo was done and showed an LVEF of 55 to 60%, mild LVH, moderate mitral annular calcification, trivial mitral regurgitation the aortic valve leaflets were thickened, leaflet excursion was limited.  Mean gradient was 29 mmHg, peak gradient 21 mmHg valve area was 0.73 cm. On 06/21/2020 presented to the emergency department for weakness and fatigue.  She was in A-fib with RVR.  The patient was admitted and underwent TAVR during this admission. Later on 10/2020 the patient developed tachybradycardia syndrome and had a [employer] dual pacemaker implanted.  Most recent echo was done on 05/2021.  Showed an LVEF of 60 to 65%.  No regional  wall motion, normal RV function, trivial MR, normal pulmonary pressure, mild LVH, severe left atrial enlargement, TAVR normal structure and function. the patient continues to have difficulty with rate control and had an  AV node ablation on 07/2021 for atrial fibrillation. The patient is currently on Eliquis 5 mg twice daily.  She presented to the emergency room on 03/21/2022 for chest pain and uncontrolled blood pressure.  Cardiology was consulted.  No further ischemic evaluation was pursued at that time. The patient was most recently seen outpatient by Dr. Lynnette Caffey on 12/2022.   Briana Foster presented to the emergency department with the above medical history complaining of left flank.  The pain started a couple of hours prior to arrival and came on fairly suddenly.  The pain feels similar to previous kidney stone.  Urinalysis shows a significant amount of hemoglobin,  denied having any chest pain shortness of breath.  Had a renal CT stone study done on 04/13/2023 which showed hydronephrosis due to calculus at the ureterovesicular junction.  Blood pressure in the ER was elevated at 160/96 Labs in the emergency department showed a creatinine of 1.0, potassium of 3.9, sodium of 139, elevated glucose of 246.  Blood count showed elevated white blood cells of 11.1 and normal hemoglobin of 14.0.  High-sensitivity troponins were elevated 51 -> 104.  EKG in the emergency department had a right bundle branch block and a rate of 90.  The rhythm was ventricularly paced.  This is unchanged from prior EKGs  On interview the patient affirms above history.  It was difficult to conduct interview  as the patient was drowsy. Denied having any symptoms of ACS including chest pain, jaw pain, arm pain, shortness of breath, palpitations, diaphoresis, or edema.  Denies any past history of tobacco use, alcohol use or any illicit substances.  Reported that mother had heart trouble.  4  Past Medical History:  Diagnosis Date    Anemia    Arthritis 07-20-12   hands   Asthma    Back pain    Carotid arterial disease (HCC)    Class 1 obesity with serious comorbidity and body mass index (BMI) of 31.0 to 31.9 in adult 03/26/2018   Depression    Essential hypertension 03/26/2018   GERD (gastroesophageal reflux disease)    "comes and goes" - no meds currently   Hyperlipidemia    Joint pain    Persistent atrial fibrillation (HCC)    Restless leg syndrome    Rheumatoid arthritis (HCC)    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   s/p TAVR with a 23 mm Edwards S3U via the TF approach by Dr. Clifton James & Dr. Cornelius Moras   Severe aortic stenosis    Type 2 diabetes mellitus without complication, without long-term current use of insulin (HCC) 04/10/2018   Vitamin D deficiency     Past Surgical History:  Procedure Laterality Date   AV NODE ABLATION N/A 07/14/2021   Procedure: AV NODE ABLATION;  Surgeon: Regan Lemming, MD;  Location: [facility];  Service: Cardiovascular;  Laterality: N/A;   BUNIONECTOMY  20 yrs ago   bil feet   BUNIONECTOMY  11/30/2011   Procedure: BUNIONECTOMY;  Surgeon: Drucilla Schmidt, MD;  Location: [facility] ORS;  Service: Orthopedics;  Laterality: Bilateral;  RIGHT FOOT EXCISION OF BUNIONETTE AND PARTIAL   PROXIMAL PHALANGECTOMY OF 5TH TOE LEFT FOOT FUNK BUNIONECTOMY,EXCISION OF BUNIONETTE    CARDIOVERSION N/A 11/05/2019   Procedure: CARDIOVERSION;  Surgeon: Lewayne Bunting, MD;  Location: [facility] ENDOSCOPY;  Service: Cardiovascular;  Laterality: N/A;   CARDIOVERSION N/A 12/05/2019   Procedure: CARDIOVERSION;  Surgeon: Parke Poisson, MD;  Location: [facility] ENDOSCOPY;  Service: Cardiovascular;  Laterality: N/A;   COLONOSCOPY WITH PROPOFOL N/A 08/07/2012   Procedure: COLONOSCOPY WITH PROPOFOL;  Surgeon: Charolett Bumpers, MD;  Location: [facility] ENDOSCOPY;  Service: Endoscopy;  Laterality: N/A;   MOUTH SURGERY     teeth extractions   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   Procedure: PACEMAKER  IMPLANT;  Surgeon: Regan Lemming, MD;  Location: [facility];  Service: Cardiovascular;  Laterality: N/A;   RIGHT/LEFT HEART CATH AND CORONARY ANGIOGRAPHY N/A 06/17/2020   Procedure: RIGHT/LEFT HEART CATH AND CORONARY ANGIOGRAPHY;  Surgeon: Lyn Records, MD;  Location: [facility];  Service: Cardiovascular;  Laterality: N/A;   TONSILLECTOMY     TRANSCATHETER AORTIC VALVE REPLACEMENT, TRANSFEMORAL N/A 06/30/2020   Procedure: TRANSCATHETER AORTIC VALVE REPLACEMENT, TRANSFEMORAL;  Surgeon: Kathleene Hazel, MD;  Location: [facility];  Service: Open Heart Surgery;  Laterality: N/A;   TUBAL LIGATION     WRIST SURGERY         Inpatient Medications: Scheduled Meds:  insulin aspart  0-5 Units Subcutaneous QHS   insulin aspart  0-9 Units Subcutaneous TID WC   rOPINIRole  2 mg Oral BID   Continuous Infusions:  heparin 1,000 Units/hr (04/13/23 1327)   PRN Meds: acetaminophen **OR** acetaminophen, albuterol, bisacodyl, hydrALAZINE, polyethylene glycol  Allergies:    Allergies  Allergen Reactions   Bee Venom Shortness Of Breath and Rash  Claritin [Loratadine] Itching and Palpitations   Wasp Venom Anaphylaxis   Apricot Flavoring Agent (Non-Screening) Swelling    Eyes swell shut   Atorvastatin Itching    Eye swelling Other reaction(s): HA   Losartan Itching and Rash    Social History:   Social History   Socioeconomic History   Marital status: Widowed    Spouse name: Not on file   Number of children: 2   Years of education: Not on file   Highest education level: Not on file  Occupational History   Occupation: Retired-worked at [employer]  Tobacco Use   Smoking status: Never   Smokeless tobacco: Never  Vaping Use   Vaping status: Never Used  Substance and Sexual Activity   Alcohol use: No   Drug use: No   Sexual activity: Not Currently  Other Topics Concern   Not on file  Social History Narrative   Not on file   Social Drivers of  Health   Financial Resource Strain: Not on file  Food Insecurity: No Food Insecurity (05/22/2022)   Hunger Vital Sign    Worried About Running Out of Food in the Last Year: Never true    Ran Out of Food in the Last Year: Never true  Transportation Needs: No Transportation Needs (05/22/2022)   PRAPARE - Administrator, Civil Service (Medical): No    Lack of Transportation (Non-Medical): No  Physical Activity: Not on file  Stress: Not on file  Social Connections: Not on file  Intimate Partner Violence: Not At Risk (05/22/2022)   Humiliation, Afraid, Rape, and Kick questionnaire    Fear of Current or Ex-Partner: No    Emotionally Abused: No    Physically Abused: No    Sexually Abused: No    Family History:    Family History  Problem Relation Age of Onset   Heart disease Mother    Stroke Mother    Cancer Father        Prostate     ROS:  Please see the history of present illness.   All other ROS reviewed and negative.     Physical Exam/Data:   Vitals:   04/13/23 0653 04/13/23 0654 04/13/23 1050 04/13/23 1200  BP:  (!) 160/96 (!) 161/63 (!) 126/59  Pulse:   (!) 59 63  Resp:   11 13  Temp:   97.7 F (36.5 C) 97.6 F (36.4 C)  TempSrc:   Oral   SpO2:   94% 97%  Weight: 83.9 kg     Height: 5\' 8"  (1.727 m)      No intake or output data in the 24 hours ending 04/13/23 1337    04/13/2023    6:53 AM 12/07/2022    1:18 PM 12/03/2022    7:09 PM  Last 3 Weights  Weight (lbs) 185 lb 208 lb 190 lb  Weight (kg) 83.915 kg 94.348 kg 86.183 kg     Body mass index is 28.13 kg/m.  General:  Well nourished, well developed, in no acute distress HEENT: normal Neck: no JVD Vascular: No carotid bruits; Distal pulses 2+ bilaterally Cardiac:  normal S1, S2; RRR; systolic ejection murmur Lungs:  clear to auscultation bilaterally, no wheezing, rhonchi or rales  Abd: soft, nontender, no hepatomegaly  Ext: mild 1+ edema Musculoskeletal:  No deformities, BUE and BLE strength  normal and equal Skin: warm and dry  Neuro:  CNs 2-12 intact, no focal abnormalities noted Psych:  Normal affect, was drowsy during exam  EKG:  The EKG was personally reviewed and demonstrates:  EKG in the emergency department had a right bundle branch block and a rate of 90. Telemetry:  Telemetry was personally reviewed and demonstrates: Rate that is about 60, with brief episodes into the 80s and 90s.  Relevant CV Studies: Echo on 05/2021 IMPRESSIONS     1. Left ventricular ejection fraction, by estimation, is 60 to 65%. The  left ventricle has normal function. The left ventricle has no regional  wall motion abnormalities. There is mild asymmetric left ventricular  hypertrophy of the basal-septal segment.  Left ventricular diastolic parameters are indeterminate.   2. Right ventricular systolic function is normal. The right ventricular  size is normal. There is normal pulmonary artery systolic pressure.   3. Left atrial size was severely dilated.   4. The mitral valve is normal in structure. Trivial mitral valve  regurgitation. No evidence of mitral stenosis. Moderate mitral annular  calcification.   5. The aortic valve has been repaired/replaced. Aortic valve  regurgitation is not visualized. No aortic stenosis is present. There is a  23 mm Edwards Sapien prosthetic (TAVR) valve present in the aortic  position. Procedure Date: 06/30/2020. Echo findings   are consistent with normal structure and function of the aortic valve  prosthesis. Aortic valve area, by VTI measures 1.77 cm. Aortic valve mean  gradient measures 7.0 mmHg. Aortic valve Vmax measures 1.86 m/s.   6. The inferior vena cava is dilated in size with >50% respiratory  variability, suggesting right atrial pressure of 8 mmHg.   FINDINGS   Left Ventricle: Left ventricular ejection fraction, by estimation, is 60  to 65%. The left ventricle has normal function. The left ventricle has no  regional wall motion abnormalities.  The left ventricular internal cavity  size was normal in size. There is   mild asymmetric left ventricular hypertrophy of the basal-septal segment.  Left ventricular diastolic function could not be evaluated due to atrial  fibrillation. Left ventricular diastolic parameters are indeterminate.   Right Ventricle: The right ventricular size is normal. No increase in  right ventricular wall thickness. Right ventricular systolic function is  normal. There is normal pulmonary artery systolic pressure. The tricuspid  regurgitant velocity is 2.42 m/s, and   with an assumed right atrial pressure of 8 mmHg, the estimated right  ventricular systolic pressure is 31.4 mmHg.   Left Atrium: Left atrial size was severely dilated.   Right Atrium: Right atrial size was normal in size.   Pericardium: There is no evidence of pericardial effusion.   Mitral Valve: The mitral valve is normal in structure. Moderate mitral  annular calcification. Trivial mitral valve regurgitation. No evidence of  mitral valve stenosis. MV peak gradient, 9.9 mmHg. The mean mitral valve  gradient is 3.0 mmHg.   Tricuspid Valve: The tricuspid valve is normal in structure. Tricuspid  valve regurgitation is trivial. No evidence of tricuspid stenosis.   Aortic Valve: The aortic valve has been repaired/replaced. Aortic valve  regurgitation is not visualized. No aortic stenosis is present. Aortic  valve mean gradient measures 7.0 mmHg. Aortic valve peak gradient measures  13.8 mmHg. Aortic valve area, by VTI   measures 1.77 cm. There is a 23 mm Edwards Sapien prosthetic, stented  (TAVR) valve present in the aortic position. Procedure Date: 06/30/2020.  Echo findings are consistent with normal structure and function of the  aortic valve prosthesis.   Pulmonic Valve: The pulmonic valve was normal in structure. Pulmonic valve  regurgitation  is trivial. No evidence of pulmonic stenosis.   Aorta: The aortic root is normal in size  and structure.   Venous: The inferior vena cava is dilated in size with greater than 50%  respiratory variability, suggesting right atrial pressure of 8 mmHg.   IAS/Shunts: No atrial level shunt detected by color flow Doppler.   Additional Comments: A device lead is visualized.    Laboratory Data:  High Sensitivity Troponin:   Recent Labs  Lab 04/13/23 0834 04/13/23 1028  TROPONINIHS 51* 104*     Chemistry Recent Labs  Lab 04/13/23 0658  NA 139  K 3.9  CL 109  CO2 17*  GLUCOSE 246*  BUN 16  CREATININE 1.00  CALCIUM 9.1  GFRNONAA 59*  ANIONGAP 13    Recent Labs  Lab 04/13/23 0658  PROT 6.6  ALBUMIN 3.9  AST 23  ALT 21  ALKPHOS 49  BILITOT 1.0   Lipids No results for input(s): "CHOL", "TRIG", "HDL", "LABVLDL", "LDLCALC", "CHOLHDL" in the last 168 hours.  Hematology Recent Labs  Lab 04/13/23 0658  WBC 11.1*  RBC 4.47  HGB 14.0  HCT 41.8  MCV 93.5  MCH 31.3  MCHC 33.5  RDW 13.2  PLT 164   Thyroid No results for input(s): "TSH", "FREET4" in the last 168 hours.  BNPNo results for input(s): "BNP", "PROBNP" in the last 168 hours.  DDimer No results for input(s): "DDIMER" in the last 168 hours.   Radiology/Studies:  CT Renal Stone Study Result Date: 04/13/2023 CLINICAL DATA:  Abdominal/flank pain with stone suspected EXAM: CT ABDOMEN AND PELVIS WITHOUT CONTRAST TECHNIQUE: Multidetector CT imaging of the abdomen and pelvis was performed following the standard protocol without IV contrast. RADIATION DOSE REDUCTION: This exam was performed according to the departmental dose-optimization program which includes automated exposure control, adjustment of the mA and/or kV according to patient size and/or use of iterative reconstruction technique. COMPARISON:  02/01/2022 FINDINGS: Lower chest: Mild interlobular septal thickening at the bases, greater on the right. Serpiginous structure at the right lung base is stable and remains consistent pulmonary arteriovenous  malformation, affected area measuring 15 mm, see CTA of the chest 06/21/2020. Pacer leads which are partially covered. Hepatobiliary: Large caudate lobe but without definite surface lobulation for cirrhosis. Small cystic density in calcification in the right lobe liver which is non worrisome. Possible single high-density gallstone, area fact by streak artifact. No biliary dilatation. Pancreas: Unremarkable. Spleen: Unremarkable. Adrenals/Urinary Tract: Negative adrenals. Left hydroureteronephrosis due to a punctate stone at the UVJ. Low-density left renal cortical expansion and perinephric stranding is attributed to the same. Clustered calculi at the poles of the right kidney measuring up to 4 mm at the upper pole. Linear calcification at the left lower pole measuring 5 mm in length. Unremarkable bladder. Stomach/Bowel:  No obstruction. No visible bowel inflammation. Vascular/Lymphatic: No acute vascular abnormality. Extensive atheromatous calcification of the aorta. No mass or adenopathy. Reproductive:No pathologic findings. Other: No ascites or pneumoperitoneum. Musculoskeletal: Generalized lumbar spine degeneration. IMPRESSION: 1. Obstructing punctate calculus at the left UVJ. 2. Chronic findings as described above. Electronically Signed   By: Tiburcio Pea M.D.   On: 04/13/2023 07:39     Assessment and Plan:   Briana Foster is a 78 y.o. 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  Elevated troponins - EKG in the emergency department had a right bundle branch block and a rate of 90.   The rhythm was ventricularly paced. This is unchanged from prior EKGs Had a left heart cath on 05/2020 as part of TAVR workup.  It showed normal coronary arteries. High-sensitivity troponins 51-> 104 -No symptoms of ACS including no chest pain, jaw pain, shortness of breath, or diaphoresis. - With the complete lack of symptoms I suspect that elevated troponins are secondary to demand ischemia in the setting of hypertension that she had from the pain of the nephrolithiasis. There is no need for further ischemic evaluation at this time -   Nephrolithiasis -Presented to the emergency department complaining of left flank pain.  The pain felt similar to prior "kidney stones".  Urinalysis had large amount of hemoglobin present. Abdominal CT showed calculus with hydronephrosis at the UVJ.   Aortic stenosis status post TAVR on 05/2020 - Had a TTE on 05/2021.  Showed an LVEF of 60 to 65%.  No regional wall motion, normal RV function, trivial MR, normal pulmonary pressure, mild LVH, severe left atrial enlargement, TAVR normal structure and function.  Persistent atrial fibrillation Tachybradycardia syndrome s/p pacemaker on 10/2020 -On 07/2021 had an AV node ablation for atrial fibrillation. -Was on Eliquis 5 mg twice daily at home. -Interrogate pacemaker.  Hypertension -Continue amlodipine 10 mg daily  Hyperlipidemia -Continue Crestor 10 mg daily  Type 2 diabetes -Management per primary  Risk Assessment/Risk Scores:  For questions or updates, please contact [facility] Please consult [email or web address] for contact info under  Signed, Arabella Merles, PA-C  04/13/2023 1:37 PM   Patient seen and examined, note reviewed with the signed Advanced Practice Provider. I personally reviewed laboratory data, imaging studies and relevant notes. I independently examined the patient and formulated the important aspects of the plan. I have personally discussed the plan with the patient and/or family. Comments  or changes to the note/plan are indicated below.  Patient seen and examined at her bedside. Her daughter was at the bedside.  She is experiencing abdominal discomfort.  No chest pain.   A/p Elevated troponin  - type 2 MI  Type 2 Diabetesmellitus  Hypertension  Persistent atrial fibrillation, Tachybrady syndrome status post PPM  Carotid artery disease  Hyperlipidemia   She is not experiencing anginal symptoms, I suspect her elevated trop is type 2 MI but it is reasonable to start medical therapy give her medical history. Continue with her heparin gtt. At this point in time - in her current clinical setting there is no need to pursue an ischemic evaluation.   In rate controlled afib.   Blood pressure slightly elevated - this is in the setting of significant pain  Obstructive nephrolithiasis - treament per primary team     Thomasene Ripple DO, MS [facility] Attending Cardiologist [facility]  [address] [place], [place] [address] [phone] Website: [email or web address]

## 2023-04-13 NOTE — ED Provider Notes (Signed)
 [place] [facility] AT [facility] Provider Note   CSN: [id number] Arrival date & time: 04/13/23  9528     History  Chief Complaint  Patient presents with   Abdominal Pain    Pt c/o of left abd pain and left flank pain that started a couple hours ago. Pt c/o of emesis as well (4 times within 1 hr). Pt does have pacemaker, afib, and hx of kidney stones. Pt received 100mg  of fentanyl and 250 bolus. Pt a/o x4.     Briana Foster is a 78 y.o. female with history of A-fib on Eliquis, TAVR, type 2 diabetes, nephrolithiasis, hypertension, presents with concern for left-sided flank pain that started a couple hours prior to arrival.  States the pain came on fairly suddenly and feels similar to previous kidney stones.  Also reports nausea and vomiting.  Denies any fevers, dysuria, hematuria, or increased frequency.  No changes in bowel habits. Denies any chest pain or shortness of breath.   Abdominal Pain      Home Medications Prior to Admission medications   Medication Sig Start Date End Date Taking? Authorizing Provider  acetaminophen (TYLENOL) 500 MG tablet Take 500-1,000 mg by mouth every 8 (eight) hours as needed for moderate pain.    [provider]  albuterol (VENTOLIN HFA) 108 (90 Base) MCG/ACT inhaler Inhale 2 puffs into the lungs every 4 (four) hours as needed for wheezing or shortness of breath. 05/30/22   Ghimire, Werner Lean, MD  amLODipine (NORVASC) 10 MG tablet Take 1 tablet (10 mg total) by mouth daily. 11/03/21   Gaston Islam., NP  apixaban (ELIQUIS) 5 MG TABS tablet Take 1 tablet (5 mg total) by mouth 2 (two) times daily. 06/23/22   Camnitz, Will Daphine Deutscher, MD  budesonide-formoterol [facility]) 160-4.5 MCG/ACT inhaler Inhale 2 puffs into the lungs 2 (two) times daily. 05/30/22   Ghimire, Werner Lean, MD  JARDIANCE 10 MG TABS tablet Take 10 mg by mouth daily. 12/09/22   [provider]  meclizine (ANTIVERT) 25 MG tablet Take 12.5 mg by mouth  2 (two) times daily as needed for nausea or dizziness. 09/09/22   [provider]  memantine (NAMENDA) 5 MG tablet Take 5 mg by mouth 2 (two) times daily.    [provider]  metFORMIN (GLUCOPHAGE) 500 MG tablet Take 1 tablet by mouth 2 (two) times daily with a meal. 12/09/22   [provider]  Multiple Vitamins-Minerals (MULTIVITAMIN WITH MINERALS) tablet Take 1 tablet by mouth daily.    [provider]  olmesartan (BENICAR) 20 MG tablet Take 20 mg by mouth daily. 03/29/22   [provider]  rOPINIRole (REQUIP) 4 MG tablet Take 4 mg by mouth at bedtime.    [provider]  rosuvastatin (CRESTOR) 10 MG tablet Take 1 tablet (10 mg total) by mouth daily. 12/07/22   Orbie Pyo, MD      Allergies    Bee venom, Claritin [loratadine], Wasp venom, Apricot flavoring agent (non-screening), Atorvastatin, and Losartan    Review of Systems   Review of Systems  Gastrointestinal:  Positive for abdominal pain.    Physical Exam Updated Vital Signs BP (!) 126/59   Pulse 63   Temp 97.6 F (36.4 C)   Resp 13   Ht 5\' 8"  (1.727 m)   Wt 83.9 kg   SpO2 97%   BMI 28.13 kg/m  Physical Exam Vitals and nursing note reviewed.  Constitutional:  General: She is not in acute distress.    Appearance: She is well-developed. She is obese. She is ill-appearing.  HENT:     Head: Normocephalic and atraumatic.  Eyes:     Conjunctiva/sclera: Conjunctivae normal.  Cardiovascular:     Rate and Rhythm: Normal rate. Rhythm irregular.     Heart sounds: Murmur heard.  Pulmonary:     Effort: Pulmonary effort is normal. No respiratory distress.     Breath sounds: Normal breath sounds.  Abdominal:     Palpations: Abdomen is soft.     Tenderness: There is no abdominal tenderness. There is left CVA tenderness. There is no right CVA tenderness.  Musculoskeletal:        General: No swelling.     Cervical back: Neck supple.  Skin:    General: Skin is warm and  dry.     Capillary Refill: Capillary refill takes less than 2 seconds.  Neurological:     Mental Status: She is alert.  Psychiatric:        Mood and Affect: Mood normal.     ED Results / Procedures / Treatments   Labs (all labs ordered are listed, but only abnormal results are displayed) Labs Reviewed  CBC WITH DIFFERENTIAL/PLATELET - Abnormal; Notable for the following components:      Result Value   WBC 11.1 (*)    Neutro Abs 8.5 (*)    All other components within normal limits  COMPREHENSIVE METABOLIC PANEL - Abnormal; Notable for the following components:   CO2 17 (*)    Glucose, Bld 246 (*)    GFR, Estimated 59 (*)    All other components within normal limits  URINALYSIS, ROUTINE W REFLEX MICROSCOPIC - Abnormal; Notable for the following components:   Glucose, UA >=500 (*)    Hgb urine dipstick LARGE (*)    Ketones, ur 5 (*)    Protein, ur 30 (*)    All other components within normal limits  TROPONIN I (HIGH SENSITIVITY) - Abnormal; Notable for the following components:   Troponin I (High Sensitivity) 51 (*)    All other components within normal limits  TROPONIN I (HIGH SENSITIVITY) - Abnormal; Notable for the following components:   Troponin I (High Sensitivity) 104 (*)    All other components within normal limits  LIPASE, BLOOD  APTT  HEPARIN LEVEL (UNFRACTIONATED)  HEMOGLOBIN A1C    EKG EKG Interpretation Date/Time:  Thursday April 13 2023 08:23:16 EDT Ventricular Rate:  90 PR Interval:  137 QRS Duration:  154 QT Interval:  427 QTC Calculation: 523 R Axis:   -79  Text Interpretation: Sinus or ectopic atrial rhythm Nonspecific IVCD with LAD LVH with secondary repolarization abnormality Inferior infarct, acute (RCA) Anterolateral infarct, old Probable RV involvement, suggest recording right precordial leads >>> Acute MI <<< Abnormal EKG Confirmed by Anders Simmonds [phone]) on 04/13/2023 8:45:20 AM  Radiology CT Renal Stone Study Result Date:  04/13/2023 CLINICAL DATA:  Abdominal/flank pain with stone suspected EXAM: CT ABDOMEN AND PELVIS WITHOUT CONTRAST TECHNIQUE: Multidetector CT imaging of the abdomen and pelvis was performed following the standard protocol without IV contrast. RADIATION DOSE REDUCTION: This exam was performed according to the departmental dose-optimization program which includes automated exposure control, adjustment of the mA and/or kV according to patient size and/or use of iterative reconstruction technique. COMPARISON:  02/01/2022 FINDINGS: Lower chest: Mild interlobular septal thickening at the bases, greater on the right. Serpiginous structure at the right lung base is stable and remains consistent pulmonary  arteriovenous malformation, affected area measuring 15 mm, see CTA of the chest 06/21/2020. Pacer leads which are partially covered. Hepatobiliary: Large caudate lobe but without definite surface lobulation for cirrhosis. Small cystic density in calcification in the right lobe liver which is non worrisome. Possible single high-density gallstone, area fact by streak artifact. No biliary dilatation. Pancreas: Unremarkable. Spleen: Unremarkable. Adrenals/Urinary Tract: Negative adrenals. Left hydroureteronephrosis due to a punctate stone at the UVJ. Low-density left renal cortical expansion and perinephric stranding is attributed to the same. Clustered calculi at the poles of the right kidney measuring up to 4 mm at the upper pole. Linear calcification at the left lower pole measuring 5 mm in length. Unremarkable bladder. Stomach/Bowel:  No obstruction. No visible bowel inflammation. Vascular/Lymphatic: No acute vascular abnormality. Extensive atheromatous calcification of the aorta. No mass or adenopathy. Reproductive:No pathologic findings. Other: No ascites or pneumoperitoneum. Musculoskeletal: Generalized lumbar spine degeneration. IMPRESSION: 1. Obstructing punctate calculus at the left UVJ. 2. Chronic findings as  described above. Electronically Signed   By: Tiburcio Pea M.D.   On: 04/13/2023 07:39    Procedures .Critical Care  Performed by: Arabella Merles, PA-C Authorized by: Arabella Merles, PA-C   Critical care provider statement:    Critical care time (minutes):  40   Critical care was necessary to treat or prevent imminent or life-threatening deterioration of the following conditions: Elevated troponin/ NSTEMI.   Critical care was time spent personally by me on the following activities:  Development of treatment plan with patient or surrogate, discussions with consultants, evaluation of patient's response to treatment, examination of patient, ordering and review of laboratory studies, ordering and review of radiographic studies, ordering and performing treatments and interventions, pulse oximetry, re-evaluation of patient's condition and review of old charts     Medications Ordered in ED Medications  aspirin chewable tablet 324 mg (has no administration in time range)  heparin ADULT infusion 100 units/mL (25000 units/239mL) (has no administration in time range)  insulin aspart (novoLOG) injection 0-9 Units (has no administration in time range)  insulin aspart (novoLOG) injection 0-5 Units (has no administration in time range)  acetaminophen (TYLENOL) tablet 650 mg (has no administration in time range)    Or  acetaminophen (TYLENOL) suppository 650 mg (has no administration in time range)  polyethylene glycol (MIRALAX / GLYCOLAX) packet 17 g (has no administration in time range)  bisacodyl (DULCOLAX) EC tablet 5 mg (has no administration in time range)  albuterol (PROVENTIL) (2.5 MG/3ML) 0.083% nebulizer solution 2.5 mg (has no administration in time range)  hydrALAZINE (APRESOLINE) injection 10 mg (has no administration in time range)  ketorolac (TORADOL) 15 MG/ML injection 15 mg (15 mg Intravenous Given 04/13/23 0701)  fentaNYL (SUBLIMAZE) injection 50 mcg (50 mcg Intravenous Given  04/13/23 0701)  prochlorperazine (COMPAZINE) injection 10 mg (10 mg Intravenous Given 04/13/23 0702)  oxyCODONE-acetaminophen (PERCOCET/ROXICET) 5-325 MG per tablet 1 tablet (1 tablet Oral Given 04/13/23 0840)  tamsulosin (FLOMAX) capsule 0.4 mg (0.4 mg Oral Given 04/13/23 0840)  fentaNYL (SUBLIMAZE) injection 50 mcg (50 mcg Intravenous Given 04/13/23 0829)  lactated ringers bolus 1,000 mL (0 mLs Intravenous Stopped 04/13/23 1015)    ED Course/ Medical Decision Making/ A&P Clinical Course as of 04/13/23 1236  Thu Apr 13, 2023  0709 EMS reported giving 4mg  zofran and fentanyl prior to arrival [AF]  1028 First trop elevated at 51, will obtain repeat for further evaluation [AF]  1204 Repeat troponin continues to rise at 104, ordered heparin and aspirin.  Consulted  cardiology, awaiting callback [AF]    Clinical Course User Index [AF] Arabella Merles, PA-C                                 Medical Decision Making Amount and/or Complexity of Data Reviewed Labs: ordered. Radiology: ordered.  Risk OTC drugs. Prescription drug management.     Differential diagnosis includes but is not limited to Cholelithiasis, cholangitis, choledocholithiasis, peptic ulcer, gastritis, gastroenteritis, appendicitis, IBS, IBD, DKA, nephrolithiasis, UTI, pyelonephritis, pancreatitis, diverticulitis, mesenteric ischemia, abdominal aortic aneurysm, small bowel obstruction, volvulus   ED Course:  Upon arrival, patient was having significant left-sided flank pain and was having active vomiting.  She already received 4 mg of Zofran prior to arrival, so Compazine was given.  Toradol and fentanyl were also given for pain.  Abdomen soft and tender, but she did have left-sided CVA tenderness. I Ordered, and personally interpreted labs.  The pertinent results include:   CBC with leukocytosis of 11.1 CMP with normal creatinine, LFTs, and electrolytes.  Glucose elevated at 246 Lipase within normal  limits Urinalysis with red blood cells but no leukocytes or nitrites Initial troponin of 51, repeat at 104 8:30am: Upon re-evaluation, patient reports pain better controlled, but feels fentanyl is wearing off. No further episodes of emesis. She is having difficulty giving a urine, so added on LR fluids at 2100ml/hour given history of CHF.   Patient given first dose of Flomax and Percocet here Patient's EKG does look abnormal from her baseline.  There is some prominent Q and S's concerning for meeting Sgarbossa criteria.  Patient has denied chest pain or shortness of breath since her abdominal pain started.  Will add on troponin.  I discussed this with my attending Dr. Andria Meuse, if first troponin unremarkable, she will be able to discharge home. No concern for STEMI at this time per Dr. Andria Meuse. 10:35am: Patient troponin elevated at 51, although has had elevations in the past. Will obtain repeat.  11:11 AM, upon reevaluation, patient resting comfortably and sleeping. 12:04 PM: Patient second troponin resulted at 104.  Given continued elevation and EKG changes, started patient on heparin per pharmacy consult and 324 mg chewable aspirin for ACS.  Consulted with cardiology who will see patient in consult.  Impression: Right-sided nephrolithiasis Elevated troponin  Disposition:  Admission with Hospitalist Dr Pola Corn for further management  Imaging Studies ordered: I ordered imaging studies including CT renal  I independently visualized the imaging with scope of interpretation limited to determining acute life threatening conditions related to emergency care. Imaging showed clustered caliculi in the roles of right kidney, up to 4mm I agree with the radiologist interpretation   Cardiac Monitoring: / EKG: The patient was maintained on a cardiac monitor.  I personally viewed and interpreted the cardiac monitored which showed an underlying rhythm of: atrial fibrillation with ST elevations in inferolateral  leads   Consultations Obtained: I requested consultation with the urologist Dr. Ronne Binning,  and discussed lab and imaging findings as well as pertinent plan - they recommend: Outpatient follow-up if patient's pain is able to be managed and no signs of systemic infection. I requested consultation with cardiology,  and discussed lab and imaging findings as well as pertinent plan - they recommend: Will plan on seeing patient in consult I requested consultation with the hospitalist Dr Pola Corn,  and discussed lab and imaging findings as well as pertinent plan - they recommend: admission for further management  Final Clinical Impression(s) / ED Diagnoses Final diagnoses:  Elevated troponin  Nephrolithiasis    Rx / DC Orders ED Discharge Orders     None         Arabella Merles, PA-C 04/13/23 1237    Anders Simmonds T, DO 04/14/23 [phone]

## 2023-04-13 NOTE — Progress Notes (Signed)
 PHARMACY - ANTICOAGULATION CONSULT NOTE  Pharmacy Consult for UFH IV Infusion Indication: chest pain/ACS  Allergies  Allergen Reactions   Bee Venom Shortness Of Breath and Rash   Claritin [Loratadine] Itching and Palpitations   Wasp Venom Anaphylaxis   Apricot Flavoring Agent (Non-Screening) Swelling and Other (See Comments)    Eyes swell shut   Atorvastatin Itching, Swelling and Other (See Comments)    Eye swelling and headaches   Losartan Itching and Rash    Patient Measurements: Height: 5\' 8"  (172.7 cm) Weight: 89.8 kg (197 lb 15.6 oz) IBW/kg (Calculated) : 63.9 Heparin Dosing Weight: 83.9 kg  Vital Signs: Temp: 97.8 F (36.6 C) (03/13 1500) Temp Source: Oral (03/13 1500) BP: 147/64 (03/13 1500) Pulse Rate: 71 (03/13 1500)  Labs: Recent Labs    04/13/23 0658 04/13/23 0834 04/13/23 1028 04/13/23 1327 04/13/23 1833  HGB 14.0  --   --   --   --   HCT 41.8  --   --   --   --   PLT 164  --   --   --   --   APTT  --   --   --  32 53*  HEPARINUNFRC  --   --   --   --  0.58  CREATININE 1.00  --   --   --   --   TROPONINIHS  --  51* 104*  --   --     Estimated Creatinine Clearance: 57 mL/min (by C-G formula based on SCr of 1 mg/dL).   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 Clifton James & Dr. Cornelius Moras   Severe aortic stenosis    Type 2 diabetes mellitus without  complication, without long-term current use of insulin (HCC) 04/10/2018   Vitamin D deficiency     Medications:  Scheduled:   amLODipine  10 mg Oral Daily   insulin aspart  0-5 Units Subcutaneous QHS   insulin aspart  0-9 Units Subcutaneous TID WC   memantine  5 mg Oral BID   rOPINIRole  2 mg Oral BID   rosuvastatin  10 mg Oral Daily   Infusions:   heparin 1,000 Units/hr (04/13/23 1327)    Assessment: 77 YOF PMH DM2, HTN, HLD, severe aortic stenosis s/p TAVR, carotid aretery disease, AF on Eliquis. ED visit 04/13/22 with abd/flank pain for a few hours prior to arrival with 3-4 episodes of emesis. History of kidney stones with reported similar symptoms. Troponins were rising (51 >104). Cardiology consulted and started on UFH infusion + ASA 324 x1. CBC stable. No signs of bleeding. Unable to determine last dose of Eliquis. Will start UFH now with concerns for ACS.  Heparin level came back at 0.58 (likely falsely therapeutic), aPTT came back subtherapeutic at 53,  on heparin infusion at 1000 units/hr. Hgb 14, plt 164. No s/sx of bleeding or infusion issues.   Goal of Therapy:  Heparin level 0.3-0.7 units/ml aPTT 66-102 seconds Monitor platelets by anticoagulation protocol: Yes   Plan:  Increase heparin infusion to 1250 units/hr Check aPTT and anti-Xa level in 6 hours and daily while on heparin Continue to monitor H&H and platelets  Thank you for allowing pharmacy to participate in this patient's care,  Sherron Monday, PharmD, BCCCP Clinical Pharmacist  Phone: [phone] 04/13/2023 7:57 PM  Please check AMION for all [facility] Pharmacy phone numbers After 10:00 PM, call Main Pharmacy [phone]

## 2023-04-13 NOTE — H&P (Signed)
 [facility] History and Physical  Briana Foster LKG:[id number] DOB: 1945/06/03 DOA: 04/13/2023 PCP: Georgann Housekeeper, MD  Presented from: Home Chief Complaint: Abdominal pain  History of Present Illness: Briana Foster is a 78 y.o. female with PMH significant for DM2, HTN, HLD, severe aortic stenosis s/p TAVR, carotid artery disease, persistent A-fib s/p PPM on Eliquis, nephrolithiasis, chronic anemia, rheumatoid arthritis, GERD Patient presented to the ED today with complaint of left abdomen/flank pain that started few hours prior to presentation.  Also did with 3-4 episodes of vomiting. She has history of kidney stones and today her symptoms felt similar to previous experience of kidney stone. EMS gave her fentanyl and Zofran prior to presentation.  In the ED, patient is afebrile, heart rate in 60s, blood pressure 160/96, breathing on 2 L oxygen On exam, she had left CVA tenderness. Labs showed WBC count 11.1, glucose level elevated to 46, serum bicarb low at 17, Urinalysis showed clear yellow urine with 500 glucose, large hemoglobin, 5 ketones, no bacteria CT renal study showed obstructing punctate calculus at the left UVJ.  In the ED, patient was given IV Compazine, IV Toradol and IV fentanyl, oral Flomax and Percocet. EDP discussed with urology Dr. Ronne Binning.  In the absence of systemic infection, recommended pain management and outpatient follow-up with urology.  Patient did not had any anginal complaints but it seems troponin were checked and was elevated 51>104. EDP with cardiologist on-call. Recommended heparin drip.  Given 1 dose of aspirin 324 mg. Hospitalist service was consulted for inpatient management.  At the time of my evaluation, patient was lying on bed.  Not in distress.  Pain controlled with pain medicines given earlier.  Was partially sedated with pain meds. Family not at bedside. Denies any anginal symptoms  Review of Systems:  All systems were reviewed  and were negative unless otherwise mentioned in the HPI   Past medical history: Past Medical History:  Diagnosis Date   Anemia    Arthritis 07-20-12   hands   Asthma    Back pain    Carotid arterial disease (HCC)    Class 1 obesity with serious comorbidity and body mass index (BMI) of 31.0 to 31.9 in adult 03/26/2018   Depression    Essential hypertension 03/26/2018   GERD (gastroesophageal reflux disease)    "comes and goes" - no meds currently   Hyperlipidemia    Joint pain    Persistent atrial fibrillation (HCC)    Restless leg syndrome    Rheumatoid arthritis (HCC)    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 Clifton James & Dr. Cornelius Moras   Severe aortic stenosis    Type 2 diabetes mellitus without complication, without long-term current use of insulin (HCC) 04/10/2018   Vitamin D deficiency     Past surgical history: Past Surgical History:  Procedure Laterality Date   AV NODE ABLATION N/A 07/14/2021   Procedure: AV NODE ABLATION;  Surgeon: Regan Lemming, MD;  Location: [facility];  Service: Cardiovascular;  Laterality: N/A;   BUNIONECTOMY  20 yrs ago   bil feet   BUNIONECTOMY  11/30/2011   Procedure: BUNIONECTOMY;  Surgeon: Drucilla Schmidt, MD;  Location: [facility];  Service: Orthopedics;  Laterality: Bilateral;  RIGHT FOOT EXCISION OF BUNIONETTE AND PARTIAL   PROXIMAL PHALANGECTOMY OF 5TH TOE LEFT FOOT FUNK BUNIONECTOMY,EXCISION OF BUNIONETTE    CARDIOVERSION N/A 11/05/2019  Procedure: CARDIOVERSION;  Surgeon: Lewayne Bunting, MD;  Location: [facility] ENDOSCOPY;  Service: Cardiovascular;  Laterality: N/A;   CARDIOVERSION N/A 12/05/2019   Procedure: CARDIOVERSION;  Surgeon: Parke Poisson, MD;  Location: [facility] ENDOSCOPY;  Service: Cardiovascular;  Laterality: N/A;   COLONOSCOPY WITH PROPOFOL N/A 08/07/2012   Procedure: COLONOSCOPY WITH PROPOFOL;  Surgeon: Charolett Bumpers, MD;  Location: [facility]  ENDOSCOPY;  Service: Endoscopy;  Laterality: N/A;   MOUTH SURGERY     teeth extractions   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   Procedure: PACEMAKER IMPLANT;  Surgeon: Regan Lemming, MD;  Location: [facility];  Service: Cardiovascular;  Laterality: N/A;   RIGHT/LEFT HEART CATH AND CORONARY ANGIOGRAPHY N/A 06/17/2020   Procedure: RIGHT/LEFT HEART CATH AND CORONARY ANGIOGRAPHY;  Surgeon: Lyn Records, MD;  Location: [facility];  Service: Cardiovascular;  Laterality: N/A;   TONSILLECTOMY     TRANSCATHETER AORTIC VALVE REPLACEMENT, TRANSFEMORAL N/A 06/30/2020   Procedure: TRANSCATHETER AORTIC VALVE REPLACEMENT, TRANSFEMORAL;  Surgeon: Kathleene Hazel, MD;  Location: [facility];  Service: Open Heart Surgery;  Laterality: N/A;   TUBAL LIGATION     WRIST SURGERY      Social History:  reports that she has never smoked. She has never used smokeless tobacco. She reports that she does not drink alcohol and does not use drugs.  Allergies:  Allergies  Allergen Reactions   Bee Venom Shortness Of Breath and Rash   Claritin [Loratadine] Itching and Palpitations   Wasp Venom Anaphylaxis   Apricot Flavoring Agent (Non-Screening) Swelling    Eyes swell shut   Atorvastatin Itching    Eye swelling Other reaction(s): HA   Losartan Itching and Rash   Bee venom, Claritin [loratadine], Wasp venom, Apricot flavoring agent (non-screening), Atorvastatin, and Losartan   Family history:  Family History  Problem Relation Age of Onset   Heart disease Mother    Stroke Mother    Cancer Father        Prostate     Physical Exam: Vitals:   04/13/23 0653 04/13/23 0654 04/13/23 1050 04/13/23 1200  BP:  (!) 160/96 (!) 161/63 (!) 126/59  Pulse:   (!) 59 63  Resp:   11 13  Temp:   97.7 F (36.5 C) 97.6 F (36.4 C)  TempSrc:   Oral   SpO2:   94% 97%  Weight: 83.9 kg     Height: 5\' 8"  (1.727 m)      Wt Readings from Last 3 Encounters:  04/13/23 83.9 kg  12/07/22 94.3  kg  12/03/22 86.2 kg   Body mass index is 28.13 kg/m.  General exam: Pleasant, elderly Caucasian female.  Not in distress Skin: No rashes, lesions or ulcers. HEENT: Atraumatic, normocephalic, no obvious bleeding Lungs: Clear to auscultation bilaterally,  CVS: S1, S2, no murmur,   GI/Abd: Soft, left costovertebral angle tenderness present, nondistended, bowel sound present,  CNS: Somnolent from the effect of pain medicine.  Able to follow commands when awake Psychiatry: Mood appropriate,  Extremities: No pedal edema, no calf tenderness,    ----------------------------------------------------------------------------------------------------------------------------------------- ----------------------------------------------------------------------------------------------------------------------------------------- -----------------------------------------------------------------------------------------------------------------------------------------  Assessment/Plan: Principal Problem:   Elevated troponin  Obstructive nephrolithiasis Present with left abdomen/flank pain.  CT renal study showed obstructing punctate calculus at the left UVJ. EDP discussed with urology Dr. Ronne Binning.  In the absence of systemic infection, recommended pain management and outpatient follow-up with urology. Pain medicine seems to be working.  If pain recurs, may need urology consultation and inpatient procedure. Continue symptomatic  management with pain medicine, IV antibiotics  Elevated troponin Patient did not had any anginal complaints but it seems troponin were checked and was elevated 51>104. EDP with cardiologist on-call. Recommended heparin drip.  Given 1 dose of aspirin 324 mg.  Type 2 diabetes mellitus uncontrolled with hyperglycemia A1c 8.1 on 05/23/2022 Glucose elevated to 246 on blood work PTA meds-Jardiance 10 mg daily Start SSI/Accu-Cheks No results for input(s): "GLUCAP" in the last 168  hours.  Hypertension PTA meds- amlodipine 10 mg daily, olmesartan 20 mg daily Resume amlodipine..  Hold olmesartan.  Continue to monitor  Persistent A-fib s/p PPM Does not seem to be on any AV nodal blocking agent  chronically anticoagulated with Eliquis   Carotid artery disease HLD PTA meds- Eliquis, Crestor Continue Crestor. Keep Eliquis on hold while on heparin drip and potential need of inpatient urology procedure  Severe aortic stenosis  s/p TAVR  Dementia Namenda  Restless legs Requip  Mobility: Encourage ambulation  Goals of care:   Code Status: Full Code    DVT prophylaxis: Heparin drip    Antimicrobials: None Fluid: None Consultants: Cardiology, urology on the phone Family Communication: Not at bedside  Status: Observation Level of care:  Telemetry Cardiac   Patient is from: Home Anticipated d/c to: Pending clinical course  Diet: Diet Order     None        ------------------------------------------------------------------------------------- Severity of Illness: The appropriate patient status for this patient is OBSERVATION. Observation status is judged to be reasonable and necessary in order to provide the required intensity of service to ensure the patient's safety. The patient's presenting symptoms, physical exam findings, and initial radiographic and laboratory data in the context of their medical condition is felt to place them at decreased risk for further clinical deterioration. Furthermore, it is anticipated that the patient will be medically stable for discharge from the hospital within 2 midnights of admission.  -------------------------------------------------------------------------------------   Home Meds: Prior to Admission medications   Medication Sig Start Date End Date Taking? Authorizing Provider  acetaminophen (TYLENOL) 500 MG tablet Take 500-1,000 mg by mouth every 8 (eight) hours as needed for moderate pain.    [provider]  albuterol (VENTOLIN HFA) 108 (90 Base) MCG/ACT inhaler Inhale 2 puffs into the lungs every 4 (four) hours as needed for wheezing or shortness of breath. 05/30/22   Ghimire, Werner Lean, MD  amLODipine (NORVASC) 10 MG tablet Take 1 tablet (10 mg total) by mouth daily. 11/03/21   Gaston Islam., NP  apixaban (ELIQUIS) 5 MG TABS tablet Take 1 tablet (5 mg total) by mouth 2 (two) times daily. 06/23/22   Camnitz, Will Daphine Deutscher, MD  budesonide-formoterol [facility]) 160-4.5 MCG/ACT inhaler Inhale 2 puffs into the lungs 2 (two) times daily. 05/30/22   Ghimire, Werner Lean, MD  JARDIANCE 10 MG TABS tablet Take 10 mg by mouth daily. 12/09/22   [provider]  meclizine (ANTIVERT) 25 MG tablet Take 12.5 mg by mouth 2 (two) times daily as needed for nausea or dizziness. 09/09/22   [provider]  memantine (NAMENDA) 5 MG tablet Take 5 mg by mouth 2 (two) times daily.    [provider]  metFORMIN (GLUCOPHAGE) 500 MG tablet Take 1 tablet by mouth 2 (two) times daily with a meal. 12/09/22   [provider]  Multiple Vitamins-Minerals (MULTIVITAMIN WITH MINERALS) tablet Take 1 tablet by mouth daily.    [provider]  olmesartan (BENICAR) 20 MG tablet Take 20 mg by mouth  daily. 03/29/22   [provider]  rOPINIRole (REQUIP) 4 MG tablet Take 4 mg by mouth at bedtime.    [provider]  rosuvastatin (CRESTOR) 10 MG tablet Take 1 tablet (10 mg total) by mouth daily. 12/07/22   Orbie Pyo, MD    Labs on Admission:   CBC: Recent Labs  Lab 04/13/23 0658  WBC 11.1*  NEUTROABS 8.5*  HGB 14.0  HCT 41.8  MCV 93.5  PLT 164    Basic Metabolic Panel: Recent Labs  Lab 04/13/23 0658  NA 139  K 3.9  CL 109  CO2 17*  GLUCOSE 246*  BUN 16  CREATININE 1.00  CALCIUM 9.1    Liver Function Tests: Recent Labs  Lab 04/13/23 0658  AST 23  ALT 21  ALKPHOS 49  BILITOT 1.0  PROT 6.6  ALBUMIN 3.9   Recent Labs  Lab  04/13/23 0658  LIPASE 29   No results for input(s): "AMMONIA" in the last 168 hours.  Cardiac Enzymes: No results for input(s): "CKTOTAL", "CKMB", "CKMBINDEX", "TROPONINI" in the last 168 hours.  BNP (last 3 results) Recent Labs    05/22/22 1110 05/29/22 0217  BNP 447.7* 93.6    ProBNP (last 3 results) No results for input(s): "PROBNP" in the last 8760 hours.  CBG: No results for input(s): "GLUCAP" in the last 168 hours.  Lipase     Component Value Date/Time   LIPASE 29 04/13/2023 0658     Urinalysis    Component Value Date/Time   COLORURINE YELLOW 04/13/2023 1049   APPEARANCEUR CLEAR 04/13/2023 1049   APPEARANCEUR Clear 02/16/2022 1316   LABSPEC 1.022 04/13/2023 1049   PHURINE 5.0 04/13/2023 1049   GLUCOSEU >=500 (A) 04/13/2023 1049   HGBUR LARGE (A) 04/13/2023 1049   BILIRUBINUR NEGATIVE 04/13/2023 1049   BILIRUBINUR Negative 02/16/2022 1316   KETONESUR 5 (A) 04/13/2023 1049   PROTEINUR 30 (A) 04/13/2023 1049   NITRITE NEGATIVE 04/13/2023 1049   LEUKOCYTESUR NEGATIVE 04/13/2023 1049     Drugs of Abuse  No results found for: "LABOPIA", "COCAINSCRNUR", "LABBENZ", "AMPHETMU", "THCU", "LABBARB"    Radiological Exams on Admission: CT Renal Stone Study Result Date: 04/13/2023 CLINICAL DATA:  Abdominal/flank pain with stone suspected EXAM: CT ABDOMEN AND PELVIS WITHOUT CONTRAST TECHNIQUE: Multidetector CT imaging of the abdomen and pelvis was performed following the standard protocol without IV contrast. RADIATION DOSE REDUCTION: This exam was performed according to the departmental dose-optimization program which includes automated exposure control, adjustment of the mA and/or kV according to patient size and/or use of iterative reconstruction technique. COMPARISON:  02/01/2022 FINDINGS: Lower chest: Mild interlobular septal thickening at the bases, greater on the right. Serpiginous structure at the right lung base is stable and remains consistent pulmonary  arteriovenous malformation, affected area measuring 15 mm, see CTA of the chest 06/21/2020. Pacer leads which are partially covered. Hepatobiliary: Large caudate lobe but without definite surface lobulation for cirrhosis. Small cystic density in calcification in the right lobe liver which is non worrisome. Possible single high-density gallstone, area fact by streak artifact. No biliary dilatation. Pancreas: Unremarkable. Spleen: Unremarkable. Adrenals/Urinary Tract: Negative adrenals. Left hydroureteronephrosis due to a punctate stone at the UVJ. Low-density left renal cortical expansion and perinephric stranding is attributed to the same. Clustered calculi at the poles of the right kidney measuring up to 4 mm at the upper pole. Linear calcification at the left lower pole measuring 5 mm in length. Unremarkable bladder. Stomach/Bowel:  No obstruction. No visible bowel inflammation. Vascular/Lymphatic:  No acute vascular abnormality. Extensive atheromatous calcification of the aorta. No mass or adenopathy. Reproductive:No pathologic findings. Other: No ascites or pneumoperitoneum. Musculoskeletal: Generalized lumbar spine degeneration. IMPRESSION: 1. Obstructing punctate calculus at the left UVJ. 2. Chronic findings as described above. Electronically Signed   By: Tiburcio Pea M.D.   On: 04/13/2023 07:39     Signed, Lorin Glass, MD [facility] 04/13/2023

## 2023-04-14 ENCOUNTER — Observation Stay (HOSPITAL_BASED_OUTPATIENT_CLINIC_OR_DEPARTMENT_OTHER)

## 2023-04-14 DIAGNOSIS — I10 Essential (primary) hypertension: Secondary | ICD-10-CM | POA: Diagnosis not present

## 2023-04-14 DIAGNOSIS — R7989 Other specified abnormal findings of blood chemistry: Secondary | ICD-10-CM

## 2023-04-14 DIAGNOSIS — I4819 Other persistent atrial fibrillation: Secondary | ICD-10-CM | POA: Diagnosis not present

## 2023-04-14 LAB — CBC
HCT: 37.9 % (ref 36.0–46.0)
Hemoglobin: 12.6 g/dL (ref 12.0–15.0)
MCH: 31 pg (ref 26.0–34.0)
MCHC: 33.2 g/dL (ref 30.0–36.0)
MCV: 93.3 fL (ref 80.0–100.0)
Platelets: 162 10*3/uL (ref 150–400)
RBC: 4.06 MIL/uL (ref 3.87–5.11)
RDW: 13.4 % (ref 11.5–15.5)
WBC: 11.6 10*3/uL — ABNORMAL HIGH (ref 4.0–10.5)
nRBC: 0 % (ref 0.0–0.2)

## 2023-04-14 LAB — BASIC METABOLIC PANEL
Anion gap: 5 (ref 5–15)
BUN: 23 mg/dL (ref 8–23)
CO2: 22 mmol/L (ref 22–32)
Calcium: 9.5 mg/dL (ref 8.9–10.3)
Chloride: 108 mmol/L (ref 98–111)
Creatinine, Ser: 1.41 mg/dL — ABNORMAL HIGH (ref 0.44–1.00)
GFR, Estimated: 39 mL/min — ABNORMAL LOW (ref >60)
Glucose, Bld: 127 mg/dL — ABNORMAL HIGH (ref 70–99)
Potassium: 4 mmol/L (ref 3.5–5.1)
Sodium: 135 mmol/L (ref 135–145)

## 2023-04-14 LAB — HEPARIN LEVEL (UNFRACTIONATED)
Heparin Unfractionated: 0.9 [IU]/mL — ABNORMAL HIGH (ref 0.30–0.70)
Heparin Unfractionated: 0.64 [IU]/mL (ref 0.30–0.70)

## 2023-04-14 LAB — ECHOCARDIOGRAM COMPLETE
AR max vel: 1.81 cm2
AV Area VTI: 1.84 cm2
AV Area mean vel: 1.65 cm2
AV Mean grad: 26.8 mmHg
AV Peak grad: 48 mmHg
Ao pk vel: 3.46 m/s
Area-P 1/2: 1.72 cm2
Height: 68 in
MV VTI: 2.38 cm2
S' Lateral: 2.7 cm
Weight: 3167.57 [oz_av]

## 2023-04-14 LAB — GLUCOSE, CAPILLARY
Glucose-Capillary: 116 mg/dL — ABNORMAL HIGH (ref 70–99)
Glucose-Capillary: 120 mg/dL — ABNORMAL HIGH (ref 70–99)
Glucose-Capillary: 109 mg/dL — ABNORMAL HIGH (ref 70–99)
Glucose-Capillary: 138 mg/dL — ABNORMAL HIGH (ref 70–99)

## 2023-04-14 LAB — APTT
aPTT: 156 s — ABNORMAL HIGH (ref 24–36)
aPTT: 69 s — ABNORMAL HIGH (ref 24–36)

## 2023-04-14 LAB — TROPONIN I (HIGH SENSITIVITY): Troponin I (High Sensitivity): 29 ng/L — ABNORMAL HIGH (ref <18)

## 2023-04-14 MED ORDER — SODIUM CHLORIDE 0.9 % IV SOLN: INTRAVENOUS | Status: AC

## 2023-04-14 MED ORDER — HEPARIN (PORCINE) 25000 UT/250ML-% IV SOLN
1100.0000 [IU]/h | INTRAVENOUS | Status: DC
  Administered 2023-04-14 (×2): 1100 [IU]/h via INTRAVENOUS
  Filled 2023-04-14: qty 250

## 2023-04-14 NOTE — Progress Notes (Signed)
 PHARMACY - ANTICOAGULATION CONSULT NOTE  Pharmacy Consult:  Heparin Indication: chest pain/ACS  Allergies  Allergen Reactions   Bee Venom Shortness Of Breath and Rash   Claritin [Loratadine] Itching and Palpitations   Wasp Venom Anaphylaxis   Apricot Flavoring Agent (Non-Screening) Swelling and Other (See Comments)    Eyes swell shut   Atorvastatin Itching, Swelling and Other (See Comments)    Eye swelling and headaches   Losartan Itching and Rash    Patient Measurements: Height: 5\' 8"  (172.7 cm) Weight: 89.8 kg (197 lb 15.6 oz) IBW/kg (Calculated) : 63.9 Heparin Dosing Weight: 83.9 kg  Vital Signs: Temp: 98.4 F (36.9 C) (03/14 1118) Temp Source: Oral (03/14 1118) BP: 124/63 (03/14 1118) Pulse Rate: 63 (03/14 1118)  Labs: Recent Labs    04/13/23 0658 04/13/23 0834 04/13/23 1028 04/13/23 1327 04/13/23 1833 04/14/23 0552 04/14/23 0921 04/14/23 1449  HGB 14.0  --   --   --   --  12.6  --   --   HCT 41.8  --   --   --   --  37.9  --   --   PLT 164  --   --   --   --  162  --   --   APTT  --   --   --    < > 53* 156*  --  69*  HEPARINUNFRC  --   --   --   --  0.58 0.90*  --  0.64  CREATININE 1.00  --   --   --   --  1.41*  --   --   TROPONINIHS  --  51* 104*  --   --   --  29*  --    < > = values in this interval not displayed.    Estimated Creatinine Clearance: 40.4 mL/min (A) (by C-G formula based on SCr of 1.41 mg/dL (H)).   Assessment: 62 YOF PMH DM2, HTN, HLD, severe aortic stenosis s/p TAVR, carotid aretery disease, AF on Eliquis. ED visit 04/13/22 with abd/flank pain for a few hours prior to arrival with 3-4 episodes of emesis. History of kidney stones with reported similar symptoms. Troponins were rising (51 >104). Cardiology consulted and started on UFH infusion + ASA 324 x1. CBC stable. No signs of bleeding. Unable to determine last dose of Eliquis. Will start UFH now with concerns for ACS.  aPTT now therapeutic and not yet correlating with heparin level.   No issue with heparin infusion since resumption and no bleeding per discussion with RN.  Goal of Therapy:  Heparin level 0.3-0.7 units/ml aPTT 66-102 seconds Monitor platelets by anticoagulation protocol: Yes   Plan:  Continue heparin infusion at 1100 units/hr F/u AM labs  Emelia Sandoval D. Laney Potash, PharmD, BCPS, BCCCP 04/14/2023, 3:52 PM

## 2023-04-14 NOTE — Plan of Care (Signed)
  Problem: Education: Goal: Knowledge of General Education information will improve Description: Including pain rating scale, medication(s)/side effects and non-pharmacologic comfort measures Outcome: Progressing   Problem: Health Behavior/Discharge Planning: Goal: Ability to manage health-related needs will improve Outcome: Progressing   Problem: Clinical Measurements: Goal: Ability to maintain clinical measurements within normal limits will improve Outcome: Progressing Goal: Will remain free from infection Outcome: Progressing Problem: Nutrition: Goal: Adequate nutrition will be maintained Outcome: Progressing   Problem: Coping: Goal: Level of anxiety will decrease Outcome: Progressing   Problem: Elimination: Goal: Will not experience complications related to bowel motility Outcome: Progressing Goal: Will not experience complications related to urinary retention Outcome: Progressing   Problem: Safety: Goal: Ability to remain free from injury will improve Outcome: Progressing   Problem: Skin Integrity: Goal: Risk for impaired skin integrity will decrease Outcome: Progressing   Problem: Pain Managment: Goal: General experience of comfort will improve and/or be controlled Outcome: Progressing   Goal: Respiratory complications will improve Outcome: Progressing Goal: Cardiovascular complication will be avoided Outcome: Progressing

## 2023-04-14 NOTE — Progress Notes (Signed)
 Multiple episodes of "excruciating" pain overnight. Pt groaning & writhing in bed. Pain unrelieved w/ 5mg  roxicodone. R[place], MD notified & order received for q4 PRN 1 mg IV morphine. 1mg  of IV morphone administered twice overnight

## 2023-04-14 NOTE — Progress Notes (Signed)
 PHARMACY - ANTICOAGULATION CONSULT NOTE  Pharmacy Consult for UFH IV Infusion Indication: chest pain/ACS  Allergies  Allergen Reactions   Bee Venom Shortness Of Breath and Rash   Claritin [Loratadine] Itching and Palpitations   Wasp Venom Anaphylaxis   Apricot Flavoring Agent (Non-Screening) Swelling and Other (See Comments)    Eyes swell shut   Atorvastatin Itching, Swelling and Other (See Comments)    Eye swelling and headaches   Losartan Itching and Rash    Patient Measurements: Height: 5\' 8"  (172.7 cm) Weight: 89.8 kg (197 lb 15.6 oz) IBW/kg (Calculated) : 63.9 Heparin Dosing Weight: 83.9 kg  Vital Signs: Temp: 97.5 F (36.4 C) (03/14 0425) Temp Source: Oral (03/14 0021) BP: 127/79 (03/14 0425) Pulse Rate: 58 (03/14 0425)  Labs: Recent Labs    04/13/23 0658 04/13/23 0834 04/13/23 1028 04/13/23 1327 04/13/23 1833 04/14/23 0552  HGB 14.0  --   --   --   --  12.6  HCT 41.8  --   --   --   --  37.9  PLT 164  --   --   --   --  162  APTT  --   --   --  32 53* 156*  HEPARINUNFRC  --   --   --   --  0.58 0.90*  CREATININE 1.00  --   --   --   --  1.41*  TROPONINIHS  --  51* 104*  --   --   --     Estimated Creatinine Clearance: 40.4 mL/min (A) (by C-G formula based on SCr of 1.41 mg/dL (H)).   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 Clifton James & Dr. Cornelius Moras   Severe  aortic stenosis    Type 2 diabetes mellitus without complication, without long-term current use of insulin (HCC) 04/10/2018   Vitamin D deficiency     Medications:  Scheduled:   amLODipine  10 mg Oral Daily   insulin aspart  0-5 Units Subcutaneous QHS   insulin aspart  0-9 Units Subcutaneous TID WC   memantine  5 mg Oral BID   rOPINIRole  2 mg Oral BID   rosuvastatin  10 mg Oral Daily   Infusions:   sodium chloride 150 mL/hr at 04/14/23 0842   heparin 1,100 Units/hr (04/14/23 0826)    Assessment: 77 YOF PMH DM2, HTN, HLD, severe aortic stenosis s/p TAVR, carotid aretery disease, AF on Eliquis. ED visit 04/13/22 with abd/flank pain for a few hours prior to arrival with 3-4 episodes of emesis. History of kidney stones with reported similar symptoms. Troponins were rising (51 >104). Cardiology consulted and started on UFH infusion + ASA 324 x1. CBC stable. No signs of bleeding. Unable to determine last dose of Eliquis. Will start  UFH now with concerns for ACS.  Heparin level 0.9, aPTT 156sec  on heparin infusion at 1250 units/hr. No s/sx of bleeding or infusion issues. CBC stable Pt sitting up in bed eating breakfast this am   Goal of Therapy:  Heparin level 0.3-0.7 units/ml aPTT 66-102 seconds Monitor platelets by anticoagulation protocol: Yes   Plan:  Hold heparin then decrease heparin infusion to 1100 units/hr Check aPTT and anti-Xa level in 6 hours and daily while on heparin Continue to monitor H&H and platelets    Leota Sauers Pharm.D. CPP, BCPS Clinical Pharmacist [phone] 04/14/2023 8:55 AM    Please check AMION for all [facility] Pharmacy phone numbers After 10:00 PM, call Main Pharmacy [phone]

## 2023-04-14 NOTE — Progress Notes (Addendum)
 PROGRESS NOTE    Briana Foster  AOZ:[id number] DOB: 06-02-1945 DOA: 04/13/2023 PCP: Georgann Housekeeper, MD   Brief Narrative:  Briana Foster is a 78 y.o. 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 She has history of kidney stones and today her symptoms felt similar to previous experience of kidney stone.  Upon arrival to ED, patient is afebrile, heart rate in 60s, blood pressure 160/96, breathing on 2 L oxygen.  Mild leukocytosis but otherwise electrolytes unremarkable and UA unremarkable as well however CT renal study showed obstructing punctate calculus at the left UVJ. EDP discussed with urology Dr. Ronne Binning.  In the absence of systemic infection, recommended pain management and outpatient follow-up with urology.   Patient did not had any anginal complaints but it seems troponin were checked and was elevated 51>104.  Cardiology consulted, given 1 dose of aspirin and started on heparin.  Assessment & Plan:   Principal Problem:   Elevated troponin  Excruciating left flank pain due to obstructive nephrolithiasis resulting in left hydroureteronephrosis present on admission and now AKI, not POA: EDP discussed with urology Dr. Ronne Binning.  In the absence of systemic infection, recommended pain management and outpatient follow-up with urology.  Reportedly patient has been suffering from pain.  Currently on IV morphine as needed which I will continue.  Her pain at the moment is much better.  Now she has developed AKI, baseline creatinine less than 1 and currently 1.4.  Will aggressively hydrate with 150 cc/h for next 10 hours.  Repeat labs in the morning.  Since her pain is improving so  there is no indication to reconsult urology.  Low threshold to consult urology if AKI does not improve with IV fluids and pain does not get better by tomorrow.   Elevated troponin Patient did not had any anginal complaints but it seems troponin were checked and was elevated 51>104.  Patient on heparin and cardiology following.  Acute metabolic encephalopathy: Patient was confused yesterday, likely due to pain medications.  CT head unremarkable.  Type 2 diabetes mellitus uncontrolled with hyperglycemia A1c 8.1 on 05/23/2022 PTA meds-Jardiance 10 mg daily and metformin and glimepiride..  Patient has been started on SSI and blood sugar fairly controlled.   Essential hypertension: PTA meds- amlodipine 10 mg daily, olmesartan 20 mg daily.  Continue amlodipine.  Continue to hold olmesartan as blood pressure is controlled and she has AKI.   Persistent A-fib s/p PPM Does not seem to be on any AV nodal blocking agent.  Eliquis transitioned to heparin for now.   Carotid artery disease HLD Continue heparin and statin.   Severe aortic stenosis  s/p TAVR   Dementia Namenda   Restless legs Requip    DVT prophylaxis: Heparin   Code Status: Full Code  Family Communication:  None present at bedside.  Plan of care discussed with patient in length and he/she verbalized understanding and agreed with it.  Status is: Observation The patient will require care spanning > 2 midnights and should be moved to inpatient because: AKI, needs IV fluids.   Estimated body mass index is 30.1 kg/m as calculated from the following:   Height as of this encounter: 5\' 8"  (1.727 m).   Weight as of this encounter:  89.8 kg.    Nutritional Assessment: Body mass index is 30.1 kg/m.Marland Kitchen Seen by dietician.  I agree with the assessment and plan as outlined below: Nutrition Status:        . Skin Assessment: I have examined the patient's skin and I agree with the wound assessment as performed by the wound care RN  as outlined below:    Consultants:  Cardiology  Procedures:  None  Antimicrobials:  Anti-infectives (From admission, onward)    None         Subjective: Patient seen and examined, her pain is much better now.  No chest pain, shortness of breath or palpitation or any other complaint.  Objective: Vitals:   04/13/23 2300 04/14/23 0021 04/14/23 0057 04/14/23 0425  BP: (!) 165/110 (!) 167/83 (!) 103/56 127/79  Pulse: 63 62 (!) 58 (!) 58  Resp:  16  14  Temp:  98 F (36.7 C)  (!) 97.5 F (36.4 C)  TempSrc:  Oral    SpO2: (!) 89% 93% 90% 96%  Weight:      Height:        Intake/Output Summary (Last 24 hours) at 04/14/2023 0830 Last data filed at 04/14/2023 0654 Gross per 24 hour  Intake 435.51 ml  Output 400 ml  Net 35.51 ml   Filed Weights   04/13/23 0653 04/13/23 1500  Weight: 83.9 kg 89.8 kg    Examination:  General exam: Appears calm and comfortable  Respiratory system: Clear to auscultation. Respiratory effort normal. Cardiovascular system: S1 & S2 heard, RRR. No JVD, murmurs, rubs, gallops or clicks. No pedal edema. Gastrointestinal system: Abdomen is nondistended, soft and nontender. No organomegaly or masses felt. Normal bowel sounds heard.  Left mild CVA tenderness. Central nervous system: Alert and oriented. No focal neurological deficits. Extremities: Symmetric 5 x 5 power. Skin: No rashes, lesions or ulcers Psychiatry: Judgement and insight appear normal. Mood & affect appropriate.    Data Reviewed: I have personally reviewed following labs and imaging studies  CBC: Recent Labs  Lab 04/13/23 0658 04/14/23 0552  WBC 11.1* 11.6*  NEUTROABS 8.5*  --   HGB 14.0 12.6  HCT 41.8 37.9  MCV 93.5 93.3  PLT 164 162   Basic Metabolic Panel: Recent Labs  Lab 04/13/23 0658 04/14/23 0552  NA 139 135  K 3.9 4.0  CL 109 108  CO2 17* 22  GLUCOSE 246* 127*  BUN 16 23  CREATININE 1.00 1.41*  CALCIUM 9.1 9.5   GFR: Estimated Creatinine Clearance:  40.4 mL/min (A) (by C-G formula based on SCr of 1.41 mg/dL (H)). Liver Function Tests: Recent Labs  Lab 04/13/23 0658  AST 23  ALT 21  ALKPHOS 49  BILITOT 1.0  PROT 6.6  ALBUMIN 3.9   Recent Labs  Lab 04/13/23 0658  LIPASE 29   No results for input(s): "AMMONIA" in the last 168 hours. Coagulation Profile: No results for input(s): "INR", "PROTIME" in the last 168 hours. Cardiac Enzymes: No results for input(s): "CKTOTAL", "CKMB", "CKMBINDEX", "TROPONINI" in the last 168 hours. BNP (last 3 results) No results for input(s): "PROBNP" in the last 8760 hours. HbA1C: Recent Labs    04/13/23 0658  HGBA1C 6.9*   CBG: Recent Labs  Lab 04/13/23 1700 04/13/23 2105 04/14/23 0718  GLUCAP 105* 183* 116*   Lipid Profile: No results for input(s): "CHOL", "HDL", "LDLCALC", "TRIG", "CHOLHDL", "LDLDIRECT" in the last 72 hours. Thyroid Function Tests: No results for input(s): "TSH", "T4TOTAL", "FREET4", "T3FREE", "THYROIDAB" in the last  72 hours. Anemia Panel: No results for input(s): "VITAMINB12", "FOLATE", "FERRITIN", "TIBC", "IRON", "RETICCTPCT" in the last 72 hours. Sepsis Labs: No results for input(s): "PROCALCITON", "LATICACIDVEN" in the last 168 hours.  No results found for this or any previous visit (from the past 240 hours).   Radiology Studies: CT HEAD WO CONTRAST ( ) Result Date: 04/13/2023 CLINICAL DATA:  Altered mental status EXAM: CT HEAD WITHOUT CONTRAST TECHNIQUE: Contiguous axial images were obtained from the base of the skull through the vertex without intravenous contrast. RADIATION DOSE REDUCTION: This exam was performed according to the departmental dose-optimization program which includes automated exposure control, adjustment of the mA and/or kV according to patient size and/or use of iterative reconstruction technique. COMPARISON:  None Available. FINDINGS: Brain: There is no mass, hemorrhage or extra-axial collection. The size and configuration of the  ventricles and extra-axial CSF spaces are normal. There is hypoattenuation of the white matter, most commonly indicating chronic small vessel disease. Vascular: No hyperdense vessel or unexpected vascular calcification. Skull: The visualized skull base, calvarium and extracranial soft tissues are normal. Sinuses/Orbits: No fluid levels or advanced mucosal thickening of the visualized paranasal sinuses. No mastoid or middle ear effusion. Normal orbits. Other: None. IMPRESSION: 1. No acute intracranial abnormality. 2. Chronic small vessel disease. Electronically Signed   By: Deatra Robinson M.D.   On: 04/13/2023 21:59   CT Renal Stone Study Result Date: 04/13/2023 CLINICAL DATA:  Abdominal/flank pain with stone suspected EXAM: CT ABDOMEN AND PELVIS WITHOUT CONTRAST TECHNIQUE: Multidetector CT imaging of the abdomen and pelvis was performed following the standard protocol without IV contrast. RADIATION DOSE REDUCTION: This exam was performed according to the departmental dose-optimization program which includes automated exposure control, adjustment of the mA and/or kV according to patient size and/or use of iterative reconstruction technique. COMPARISON:  02/01/2022 FINDINGS: Lower chest: Mild interlobular septal thickening at the bases, greater on the right. 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 Pacer leads which are partially covered. Hepatobiliary: Large caudate lobe but without definite surface lobulation for cirrhosis. Small cystic density in calcification in the right lobe liver which is non worrisome. Possible single high-density gallstone, area fact by streak artifact. No biliary dilatation. Pancreas: Unremarkable. Spleen: Unremarkable. Adrenals/Urinary Tract: Negative adrenals. Left hydroureteronephrosis due to a punctate stone at the UVJ. Low-density left renal  cortical expansion and perinephric stranding is attributed to the same. Clustered calculi at the poles of the right kidney measuring up to 4 mm at the upper pole. Linear calcification at the left lower pole measuring 5 mm in length. Unremarkable bladder. Stomach/Bowel:  No obstruction. No visible bowel inflammation. Vascular/Lymphatic: No acute vascular abnormality. Extensive atheromatous calcification of the aorta. No mass or adenopathy. Reproductive:No pathologic findings. Other: No ascites or pneumoperitoneum. Musculoskeletal: Generalized lumbar spine degeneration. IMPRESSION: 1. Obstructing punctate calculus at the left UVJ. 2. Chronic findings as described above. Electronically Signed   By: Tiburcio Pea M.D.   On: 04/13/2023 07:39    Scheduled Meds:  amLODipine  10 mg Oral Daily   insulin aspart  0-5 Units Subcutaneous QHS   insulin aspart  0-9 Units Subcutaneous TID WC   memantine  5 mg Oral BID   rOPINIRole  2 mg Oral BID   rosuvastatin  10 mg Oral Daily   Continuous Infusions:  sodium chloride     heparin 1,100 Units/hr (04/14/23 0826)     LOS: 0  days   Hughie Closs, MD [facility]  04/14/2023, 8:30 AM   *Please note that this is a verbal dictation therefore any spelling or grammatical errors are due to the "[employer] One" system interpretation.  Please page via Amion and do not message via secure chat for urgent patient care matters. Secure chat can be used for non urgent patient care matters.  How to contact the [facility] Attending or Consulting provider 7A - 7P or covering provider during after hours 7P -7A, for this patient?  Check the care team in [facility] and look for a) attending/consulting [facility] provider listed and b) the [facility] team listed. Page or secure chat 7A-7P. Log into [email or web address] and use [place]'s universal password to access. If you do not have the password, please contact the hospital operator. Locate the [facility] provider you are looking for under [facility] and page to a number that you can be directly reached. If you still have difficulty reaching the provider, please page the [facility] (Director on Call) for the Hospitalists listed on amion for assistance.

## 2023-04-14 NOTE — Progress Notes (Addendum)
 Progress Note  Patient Name: Briana Foster Date of Encounter: 04/14/2023  Primary Cardiologist: Orbie Pyo, MD  Subjective   Still with excruciating pain overnight with associated vomiting, unrelieved with oxycodone so required additional morphine dosing. Pain seems to have calmed down for the moment but was aggravated earlier with physical exam movement. She is concerned about being discharged while still having waves of discomfort. This tends to be worse in the afternoons when it comes, so she does not feel reassured by morning improvement.  Inpatient Medications    Scheduled Meds:  amLODipine  10 mg Oral Daily   insulin aspart  0-5 Units Subcutaneous QHS   insulin aspart  0-9 Units Subcutaneous TID WC   memantine  5 mg Oral BID   rOPINIRole  2 mg Oral BID   rosuvastatin  10 mg Oral Daily   Continuous Infusions:  sodium chloride 150 mL/hr at 04/14/23 0842   heparin 1,100 Units/hr (04/14/23 0826)   PRN Meds: acetaminophen **OR** acetaminophen, albuterol, bisacodyl, hydrALAZINE, morphine injection, naLOXone (NARCAN)  injection, polyethylene glycol, trimethobenzamide   Vital Signs    Vitals:   04/13/23 2300 04/14/23 0021 04/14/23 0057 04/14/23 0425  BP: (!) 165/110 (!) 167/83 (!) 103/56 127/79  Pulse: 63 62 (!) 58 (!) 58  Resp:  16  14  Temp:  98 F (36.7 C)  (!) 97.5 F (36.4 C)  TempSrc:  Oral    SpO2: (!) 89% 93% 90% 96%  Weight:      Height:        Intake/Output Summary (Last 24 hours) at 04/14/2023 0855 Last data filed at 04/14/2023 0654 Gross per 24 hour  Intake 435.51 ml  Output 400 ml  Net 35.51 ml      04/13/2023    3:00 PM 04/13/2023    6:53 AM 12/07/2022    1:18 PM  Last 3 Weights  Weight (lbs) 197 lb 15.6 oz 185 lb 208 lb  Weight (kg) 89.8 kg 83.915 kg 94.348 kg     Telemetry    Afib with V pacing, CVR - Personally Reviewed  Physical Exam   GEN: No acute distress.  HEENT: Normocephalic, atraumatic, sclera non-icteric. Neck: No JVD or  bruits. Cardiac: RRR soft SEM without murmurs, rubs, or gallops.  Respiratory: Clear to auscultation bilaterally. Breathing is unlabored. GI: Soft, nontender, non-distended, BS +x 4. MS: no deformity. Extremities: No clubbing or cyanosis. No edema. Distal pedal pulses are 2+ and equal bilaterally. Neuro:  AAOx3. Follows commands. Psych:  Responds to questions appropriately with a normal affect.  Labs    High Sensitivity Troponin:   Recent Labs  Lab 04/13/23 0834 04/13/23 1028  TROPONINIHS 51* 104*      Cardiac EnzymesNo results for input(s): "TROPONINI" in the last 168 hours. No results for input(s): "TROPIPOC" in the last 168 hours.   Chemistry Recent Labs  Lab 04/13/23 0658 04/14/23 0552  NA 139 135  K 3.9 4.0  CL 109 108  CO2 17* 22  GLUCOSE 246* 127*  BUN 16 23  CREATININE 1.00 1.41*  CALCIUM 9.1 9.5  PROT 6.6  --   ALBUMIN 3.9  --   AST 23  --   ALT 21  --   ALKPHOS 49  --   BILITOT 1.0  --   GFRNONAA 59* 39*  ANIONGAP 13 5     Hematology Recent Labs  Lab 04/13/23 0658 04/14/23 0552  WBC 11.1* 11.6*  RBC 4.47 4.06  HGB 14.0 12.6  HCT  41.8 37.9  MCV 93.5 93.3  MCH 31.3 31.0  MCHC 33.5 33.2  RDW 13.2 13.4  PLT 164 162    BNPNo results for input(s): "BNP", "PROBNP" in the last 168 hours.   DDimer No results for input(s): "DDIMER" in the last 168 hours.   Radiology    CT HEAD WO CONTRAST ( ) Result Date: 04/13/2023 CLINICAL DATA:  Altered mental status EXAM: CT HEAD WITHOUT CONTRAST TECHNIQUE: Contiguous axial images were obtained from the base of the skull through the vertex without intravenous contrast. RADIATION DOSE REDUCTION: This exam was performed according to the departmental dose-optimization program which includes automated exposure control, adjustment of the mA and/or kV according to patient size and/or use of iterative reconstruction technique. COMPARISON:  None Available. FINDINGS: Brain: There is no mass, hemorrhage or extra-axial  collection. The size and configuration of the ventricles and extra-axial CSF spaces are normal. There is hypoattenuation of the white matter, most commonly indicating chronic small vessel disease. Vascular: No hyperdense vessel or unexpected vascular calcification. Skull: The visualized skull base, calvarium and extracranial soft tissues are normal. Sinuses/Orbits: No fluid levels or advanced mucosal thickening of the visualized paranasal sinuses. No mastoid or middle ear effusion. Normal orbits. Other: None. IMPRESSION: 1. No acute intracranial abnormality. 2. Chronic small vessel disease. Electronically Signed   By: Deatra Robinson M.D.   On: 04/13/2023 21:59   CT Renal Stone Study Result Date: 04/13/2023 CLINICAL DATA:  Abdominal/flank pain with stone suspected EXAM: CT ABDOMEN AND PELVIS WITHOUT CONTRAST TECHNIQUE: Multidetector CT imaging of the abdomen and pelvis was performed following the standard protocol without IV contrast. RADIATION DOSE REDUCTION: This exam was performed according to the departmental dose-optimization program which includes automated exposure control, adjustment of the mA and/or kV according to patient size and/or use of iterative reconstruction technique. COMPARISON:  02/01/2022 FINDINGS: Lower chest: Mild interlobular septal thickening at the bases, greater on the right. 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 Pacer leads which are partially covered. Hepatobiliary: Large caudate lobe but without definite surface lobulation for cirrhosis. Small cystic density in calcification in the right lobe liver which is non worrisome. Possible single high-density gallstone, area fact by streak artifact. No biliary dilatation. Pancreas: Unremarkable. Spleen: Unremarkable. Adrenals/Urinary Tract: Negative adrenals. Left hydroureteronephrosis due to a punctate  stone at the UVJ. Low-density left renal cortical expansion and perinephric stranding is attributed to the same. Clustered calculi at the poles of the right kidney measuring up to 4 mm at the upper pole. Linear calcification at the left lower pole measuring 5 mm in length. Unremarkable bladder. Stomach/Bowel:  No obstruction. No visible bowel inflammation. Vascular/Lymphatic: No acute vascular abnormality. Extensive atheromatous calcification of the aorta. No mass or adenopathy. Reproductive:No pathologic findings. Other: No ascites or pneumoperitoneum. Musculoskeletal: Generalized lumbar spine degeneration. IMPRESSION: 1. Obstructing punctate calculus at the left UVJ. 2. Chronic findings as described above. Electronically Signed   By: Tiburcio Pea M.D.   On: 04/13/2023 07:39    Cardiac Studies   Echo 2023  1. Left ventricular ejection fraction, by estimation, is 60 to 65%. The left ventricle has normal function. The left ventricle has no regional  wall motion abnormalities. There is mild asymmetric left ventricular hypertrophy of the basal-septal segment.  Left ventricular diastolic parameters are indeterminate.   2. Right ventricular systolic function is normal. The right ventricular size is normal. There is normal  pulmonary artery systolic pressure.   3. Left atrial size was severely dilated.   4. The mitral valve is normal in structure. Trivial mitral valve regurgitation. No evidence of mitral stenosis. Moderate mitral annular  calcification.   5. The aortic valve has been repaired/replaced. Aortic valve regurgitation is not visualized. No aortic stenosis is present. There is a  23 mm Edwards Sapien prosthetic (TAVR) valve present in the aortic position. Procedure Date: 06/30/2020. Echo findings   are consistent with normal structure and function of the aortic valve prosthesis. Aortic valve area, by VTI measures 1.77 cm. Aortic valve mean  gradient measures 7.0 mmHg. Aortic valve Vmax measures  1.86 m/s.   6. The inferior vena cava is dilated in size with >50% respiratory variability, suggesting right atrial pressure of 8 mmHg.   Patient Profile     78 y.o. female with severe AS s/p TAVR 05/2020, no significant CAD by cath 05/2020, persistent atrial fibrillation with tachy-brady s/p STJ dual chamber PPM 10/2020 then AVN ablation 07/2021 for continued symptomatic AF, intolerance of amiodarone due to nausea, HTN, HLD, DM2, CKD stage 3, aortic atherosclerosis. She was admitted with flank pain, hematuria, hydronephrosis due to calculus at the ureterovesicular junction. Cardiology asked to see for elevated troponin.  Assessment & Plan    1. Nephrolithiasis with associated hydronephrosis, flank pain, vomiting, AKI - this is primary issue - per H/p "EDP discussed with urology Dr. Ronne Binning. In the absence of systemic infection, recommended pain management and outpatient follow-up with urology" - also with new AKI this AM - required IV morphine early this morning - further per primary team  2. Elevated troponin - suspect due to demand ischemia but hsTroponins 51->104, not trended further, will repeat value this AM to ensure peak, but I am doubtful this represents an ACS - no chest pain - had normal coronaries in 2022  - overdue for yearly post-TAVR echo, will f/u study to evaluate this with elevated troponin - hold off ASA given Hgb in urine - continue rosuvastatin if Cr does not increase further  3. Severe AS s/p TAVR 05/2020 - last echo 2023, overdue for yearly follow-up, will obtain  4. Persistent atrial fib with tachy-brady s/p STJ dual chamber PPM 10/2020 then AVN ablation 07/2021 for continued symptomatic AF - overdue for outpatient EP f/u, last seen 2023 - sent staff msg to EP scheduling to ensure gets scheduled for overdue follow-up after DC - home Eliquis on hold due to #1 with hematuria (? Only taking once daily at home), transitioned to heparin - IM has recommended cardiology weigh  in on resuming Eliquis, await input from MD. Given that she is still having severe pain overnight with new AKI, may need to have primary team involve urology to ensure no additional procedures planned before resuming DOAC  5. HTN - BP variable, follow  For questions or updates, please contact [facility] Please consult [email or web address] for contact info under Cardiology/STEMI. Signed, Laurann Montana, PA-C 04/14/2023, 8:55 AM    Patient seen and examined, note reviewed with the signed Advanced Practice Provider. I personally reviewed laboratory data, imaging studies and relevant notes. I independently examined the patient and formulated the important aspects of the plan. I have personally discussed the plan with the patient and/or family. Comments or changes to the note/plan are indicated below.  Elevated troponin in the setting of demand ischemia Severe AS status post TAVR History of persistent atrial fibrillation, tachybradycardia syndrome status post permanent pacemaker Hypertension Nephrolithiasis  No  report of heart chest discomfort continue with the heparin drip for now for the NSTEMI took over 48 hours. She has not been taking the Eliquis as prescribed, I am concerned about transitioning her to our anticoagulation given the fact that she today tells me she is experiencing more pain and given her nephrolithiasis it will be beneficial to keep her on heparin drip for now until we are cleared by neurology that there would be no further procedure or else if there is a need for procedure acutely/emergently her risk of bleeding on Eliquis.  If there is documentation for no further procedure she can be transition to her Eliquis.  Hyperlipidemia - continue with current statin medication.  Thomasene Ripple DO, MS [facility] Attending Cardiologist [facility]  [address] [place], [place] [address] [phone] Website: [email or web address]

## 2023-04-14 NOTE — Care Management Obs Status (Signed)
 MEDICARE OBSERVATION STATUS NOTIFICATION   Patient Details  Name: Briana Foster MRN: [id number] Date of Birth: May 13, 1945   Medicare Observation Status Notification Given:  Yes    Alesia Richards, RN 04/14/2023, 3:24 PM

## 2023-04-15 DIAGNOSIS — R7989 Other specified abnormal findings of blood chemistry: Secondary | ICD-10-CM | POA: Diagnosis not present

## 2023-04-15 LAB — BASIC METABOLIC PANEL
Anion gap: 10 (ref 5–15)
BUN: 20 mg/dL (ref 8–23)
CO2: 18 mmol/L — ABNORMAL LOW (ref 22–32)
Calcium: 9 mg/dL (ref 8.9–10.3)
Chloride: 110 mmol/L (ref 98–111)
Creatinine, Ser: 0.89 mg/dL (ref 0.44–1.00)
GFR, Estimated: >60 mL/min (ref >60)
Glucose, Bld: 117 mg/dL — ABNORMAL HIGH (ref 70–99)
Potassium: 4.3 mmol/L (ref 3.5–5.1)
Sodium: 138 mmol/L (ref 135–145)

## 2023-04-15 LAB — GLUCOSE, CAPILLARY: Glucose-Capillary: 134 mg/dL — ABNORMAL HIGH (ref 70–99)

## 2023-04-15 LAB — CBC
HCT: 34.7 % — ABNORMAL LOW (ref 36.0–46.0)
Hemoglobin: 11.7 g/dL — ABNORMAL LOW (ref 12.0–15.0)
MCH: 31.5 pg (ref 26.0–34.0)
MCHC: 33.7 g/dL (ref 30.0–36.0)
MCV: 93.5 fL (ref 80.0–100.0)
Platelets: 152 10*3/uL (ref 150–400)
RBC: 3.71 MIL/uL — ABNORMAL LOW (ref 3.87–5.11)
RDW: 13.6 % (ref 11.5–15.5)
WBC: 10.7 10*3/uL — ABNORMAL HIGH (ref 4.0–10.5)
nRBC: 0 % (ref 0.0–0.2)

## 2023-04-15 LAB — APTT: aPTT: 94 s — ABNORMAL HIGH (ref 24–36)

## 2023-04-15 LAB — HEPARIN LEVEL (UNFRACTIONATED): Heparin Unfractionated: 0.52 [IU]/mL (ref 0.30–0.70)

## 2023-04-15 MED ORDER — APIXABAN 5 MG PO TABS
5.0000 mg | ORAL_TABLET | Freq: Two times a day (BID) | ORAL | Status: DC
  Administered 2023-04-15: 5 mg via ORAL
  Filled 2023-04-15: qty 1

## 2023-04-15 NOTE — Discharge Summary (Signed)
 Physician Discharge Summary  Briana Foster:[id number] DOB: Nov 08, 1945 DOA: 04/13/2023  PCP: Georgann Housekeeper, MD  Admit date: 04/13/2023 Discharge date: 04/15/2023    Admitted From: Home Disposition: Home  Recommendations for Outpatient Follow-up:  Follow up with PCP in 1-2 weeks Please obtain BMP/CBC in one week Follow-up with [facility] within 1 week Please follow up with your PCP on the following pending results: Unresulted Labs (From admission, onward)     Start     Ordered   04/15/23 0500  Basic metabolic panel  Daily,   R      04/14/23 0758   04/14/23 0500  CBC  Daily,   R      04/13/23 1228              Home Health: None Equipment/Devices: None  Discharge Condition: stable CODE STATUS: Full code Diet recommendation: Cardiac  Subjective: Seen and examined.  Left flank pain is much improved, only 1 out of 10.  No nausea or vomiting, tolerating diet.  No more hematuria.  Plan of discharge discussed with the patient and she is in agreement.  She says that she has pain medications at home and does not want me to prescribe anymore.  Brief/Interim Summary: Briana Foster is a 78 y.o. 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 She has history of kidney stones and her symptoms felt similar to previous experience of kidney stone.  Upon arrival to ED, patient was afebrile, heart rate in 60s, blood pressure 160/96, breathing on 2 L oxygen.  Mild leukocytosis but otherwise electrolytes unremarkable and UA unremarkable as well however CT renal study showed obstructing punctate calculus at the left UVJ/left nephrolithiasis. EDP discussed with urology Dr.  Ronne Binning.  In the absence of systemic infection, recommended pain management and outpatient follow-up with urology.  Patient was eventually admitted under hospitalist for pain control.  She developed mild AKI yesterday, given IV fluids, AKI resolved.  Her pain is very well-controlled.  She has had no more nausea or vomiting and tolerating diet.  She is stable for discharge, recommended follow-up with urology within 1 week for definitive treatment.   Elevated troponin Patient did not had any anginal complaints but it seems troponin were checked and was elevated 51>104.  Patient was seen by cardiology, likely demand ischemia in the setting of pain.  No further intervention or change in plan recommended.   Acute metabolic encephalopathy: Patient was confused upon arrival however this was presumed to be secondary to pain medications, CT head was unremarkable and patient has been alert and oriented since yesterday.     Type 2 diabetes mellitus uncontrolled with hyperglycemia A1c 8.1 on 05/23/2022 PTA meds-Jardiance 10 mg daily and metformin and glimepiride..  Treated with SSI here.  Resume home medications.   Essential hypertension: Resume PTA medications.  We have held losartan here due to AKI.   Persistent A-fib s/p PPM Does not seem to be on any AV nodal blocking agent.  Eliquis transitioned to heparin for now.   Carotid artery disease HLD Continue heparin and statin.   Severe aortic stenosis  s/p TAVR   Dementia Namenda   Restless legs Requip  Discharge Diagnoses:  Principal Problem:   Elevated troponin Active  Problems:   AKI (acute kidney injury) (HCC)   Left nephrolithiasis   Essential hypertension    Discharge Instructions   Allergies as of 04/15/2023       Reactions   Bee Venom Shortness Of Breath, Rash   Claritin [loratadine] Itching, Palpitations   Wasp Venom Anaphylaxis   Apricot Flavoring Agent (non-screening) Swelling, Other (See Comments)   Eyes swell shut    Atorvastatin Itching, Swelling, Other (See Comments)   Eye swelling and headaches   Losartan Itching, Rash        Medication List     TAKE these medications    Acetaminophen 500 MG capsule Take 500-1,000 mg by mouth every 8 (eight) hours as needed (for pain or headaches).   amLODipine 5 MG tablet Commonly known as: NORVASC Take 5 mg by mouth daily. What changed: Another medication with the same name was removed. Continue taking this medication, and follow the directions you see here.   apixaban 5 MG Tabs tablet Commonly known as: Eliquis Take 1 tablet (5 mg total) by mouth 2 (two) times daily. What changed: when to take this   Centrum Silver 50+Women Tabs Take 1 tablet by mouth daily with breakfast.   Dexcom G7 Sensor Misc Inject 1 Device into the skin See admin instructions. Place 1 new sensor into the skin every 10 days   glimepiride 2 MG tablet Commonly known as: AMARYL Take 2 mg by mouth daily with breakfast.   Jardiance 10 MG Tabs tablet Generic drug: empagliflozin Take 10 mg by mouth daily.   meclizine 25 MG tablet Commonly known as: ANTIVERT Take 25 mg by mouth 2 (two) times daily as needed for dizziness.   memantine 5 MG tablet Commonly known as: NAMENDA Take 5 mg by mouth in the morning.   metFORMIN 500 MG tablet Commonly known as: GLUCOPHAGE Take 500 mg by mouth daily with breakfast.   olmesartan 20 MG tablet Commonly known as: BENICAR Take 20 mg by mouth daily.   rOPINIRole 4 MG tablet Commonly known as: REQUIP Take 4 mg by mouth See admin instructions. Take 4 mg by mouth at 8 PM   rosuvastatin 10 MG tablet Commonly known as: CRESTOR Take 1 tablet (10 mg total) by mouth daily.   Symbicort 160-4.5 MCG/ACT inhaler Generic drug: budesonide-formoterol Inhale 2 puffs into the lungs 2 (two) times daily.   traMADol 50 MG tablet Commonly known as: ULTRAM Take 50-100 mg by mouth every 6 (six) hours as needed (for pain).   Ventolin HFA 108 (90  Base) MCG/ACT inhaler Generic drug: albuterol Inhale 2 puffs into the lungs every 4 (four) hours as needed for wheezing or shortness of breath.        Follow-up Information     Georgann Housekeeper, MD Follow up in 1 week(s).   Specialty: Internal Medicine Contact information: [address] [place] [address] [phone]         [facility]. Call in 1 week(s).   Contact information: [address] [place] [address] [phone]               Allergies  Allergen Reactions   Bee Venom Shortness Of Breath and Rash   Claritin [Loratadine] Itching and Palpitations   Wasp Venom Anaphylaxis   Apricot Flavoring Agent (Non-Screening) Swelling and Other (See Comments)    Eyes swell shut   Atorvastatin Itching, Swelling and Other (See Comments)    Eye swelling and headaches  Losartan Itching and Rash    Consultations: Cardiology and urology   Procedures/Studies: ECHOCARDIOGRAM COMPLETE Result Date: 04/14/2023    ECHOCARDIOGRAM REPORT   Patient Name:   CATHALEEN KOROL Date of Exam: 04/14/2023 Medical Rec #:  [id number]        Height:       68.0 in Accession #:    [id number]       Weight:       198.0 lb Date of Birth:  [birth year]       BSA:          2.035 m Patient Age:    77 years         BP:           127/79 mmHg Patient Gender: F                HR:           60 bpm. Exam Location:  Inpatient Procedure: 2D Echo, Cardiac Doppler and Color Doppler (Both Spectral and Color            Flow Doppler were utilized during procedure). Indications:    Elevated Troponin  History:        Patient has prior history of Echocardiogram examinations, most                 recent 06/22/2020. Pacemaker, Arrythmias:Atrial Fibrillation;                 Risk Factors:Hypertension, Dyslipidemia and Diabetes.                 Aortic Valve: 23 mm Edwards Sapien prosthetic, stented (TAVR)                 valve is present in the aortic position. Procedure  Date:                 06/30/2020.  Sonographer:    Meagan Baucom RDCS, FE, PE Referring Phys: [id number] DAYNA N DUNN IMPRESSIONS  1. Left ventricular ejection fraction, by estimation, is 65 to 70%. The left ventricle has normal function. The left ventricle has no regional wall motion abnormalities. There is mild concentric left ventricular hypertrophy. Left ventricular diastolic parameters are indeterminate.  2. Right ventricular systolic function is normal. The right ventricular size is normal.  3. Left atrial size was severely dilated.  4. Right atrial size was moderately dilated.  5. The mitral valve is abnormal. Mild mitral valve regurgitation. No evidence of mitral stenosis. Moderate to severe mitral annular calcification.  6. Tricuspid valve regurgitation is moderate.  7. The mean gradinent through the TAVR valve has increased from on previous echo to 26.83mmHG. Visually leaflets are mildly thicked but open reasonably well. Suspect elevated gradient likely related to high flow. (VTI > 70). The aortic valve has been repaired/replaced. Aortic valve regurgitation is not visualized. No aortic stenosis is present. There is a 23 mm Edwards Sapien prosthetic (TAVR) valve present in the aortic position. Procedure Date: 06/30/2020. Echo findings are consistent with normal structure and function of the aortic valve prosthesis.  8. The inferior vena cava is dilated in size with <50% respiratory variability, suggesting right atrial pressure of 15 mmHg. FINDINGS  Left Ventricle: Left ventricular ejection fraction, by estimation, is 65 to 70%. The left ventricle has normal function. The left ventricle has no regional wall motion abnormalities. The left ventricular internal cavity size was normal in size. There is  mild concentric left ventricular hypertrophy. Left ventricular  diastolic parameters are indeterminate. Right Ventricle: The right ventricular size is normal. No increase in right ventricular wall thickness. Right  ventricular systolic function is normal. Left Atrium: Left atrial size was severely dilated. Right Atrium: Right atrial size was moderately dilated. Pericardium: There is no evidence of pericardial effusion. Mitral Valve: The mitral valve is abnormal. There is mild thickening of the mitral valve leaflet(s). Moderate to severe mitral annular calcification. Mild mitral valve regurgitation. No evidence of mitral valve stenosis. MV peak gradient, 14.8 mmHg. The mean mitral valve gradient is 4.5 mmHg. Tricuspid Valve: The tricuspid valve is normal in structure. Tricuspid valve regurgitation is moderate . No evidence of tricuspid stenosis. Aortic Valve: The mean gradinent through the TAVR valve has increased from on previous echo to 26.53mmHG. Visually leaflets are mildly thicked but open reasonably well. Suspect elevated gradient likely related to high flow. (VTI > 70). The aortic valve has been repaired/replaced. Aortic valve regurgitation is not visualized. No aortic stenosis is present. Aortic valve mean gradient measures 26.8 mmHg. Aortic valve peak gradient measures 48.0 mmHg. Aortic valve area, by VTI measures 1.84 cm. There is a 23 mm Edwards Sapien prosthetic, stented (TAVR) valve present in the aortic position. Procedure Date: 06/30/2020. Echo findings are consistent with normal structure and function of the aortic valve prosthesis. Pulmonic Valve: The pulmonic valve was normal in structure. Pulmonic valve regurgitation is trivial. No evidence of pulmonic stenosis. Aorta: The aortic root is normal in size and structure. Venous: The inferior vena cava is dilated in size with less than 50% respiratory variability, suggesting right atrial pressure of 15 mmHg. IAS/Shunts: No atrial level shunt detected by color flow Doppler. Additional Comments: A device lead is visualized.  LEFT VENTRICLE PLAX 2D LVIDd:         4.90 cm   Diastology LVIDs:         2.70 cm   LV e' medial:    6.85 cm/s LV PW:         1.00 cm    LV E/e' medial:  26.3 LV IVS:        1.10 cm   LV e' lateral:   7.18 cm/s LVOT diam:     2.10 cm   LV E/e' lateral: 25.1 LV SV:         130 LV SV Index:   64 LVOT Area:     3.46 cm  RIGHT VENTRICLE RV S prime:     12.00 cm/s TAPSE (M-mode): 1.4 cm LEFT ATRIUM             Index        RIGHT ATRIUM           Index LA Vol (A2C):   68.6 ml 33.71 ml/m  RA Area:     18.80 cm LA Vol (A4C):   88.7 ml 43.59 ml/m  RA Volume:   48.10 ml  23.64 ml/m LA Biplane Vol: 82.0 ml 40.30 ml/m  AORTIC VALVE AV Area (Vmax):    1.81 cm AV Area (Vmean):   1.65 cm AV Area (VTI):     1.84 cm AV Vmax:           346.40 cm/s AV Vmean:          237.800 cm/s AV VTI:            0.706 m AV Peak Grad:      48.0 mmHg AV Mean Grad:      26.8 mmHg LVOT Vmax:  181.00 cm/s LVOT Vmean:        113.000 cm/s LVOT VTI:          0.376 m LVOT/AV VTI ratio: 0.53  AORTA Ao Root diam: 2.80 cm MITRAL VALVE                TRICUSPID VALVE MV Area (PHT): 1.72 cm     TR Peak grad:   28.7 mmHg MV Area VTI:   2.38 cm     TR Vmax:        268.00 cm/s MV Peak grad:  14.8 mmHg MV Mean grad:  4.5 mmHg     SHUNTS MV Vmax:       1.92 m/s     Systemic VTI:  0.38 m MV Vmean:      95.9 cm/s    Systemic Diam: 2.10 cm MV Decel Time: 442 msec MV E velocity: 180.00 cm/s Arvilla Meres MD Electronically signed by Arvilla Meres MD Signature Date/Time: 04/14/2023/1:04:39 PM    Final    CT HEAD WO CONTRAST ( ) Result Date: 04/13/2023 CLINICAL DATA:  Altered mental status EXAM: CT HEAD WITHOUT CONTRAST TECHNIQUE: Contiguous axial images were obtained from the base of the skull through the vertex without intravenous contrast. RADIATION DOSE REDUCTION: This exam was performed according to the departmental dose-optimization program which includes automated exposure control, adjustment of the mA and/or kV according to patient size and/or use of iterative reconstruction technique. COMPARISON:  None Available. FINDINGS: Brain: There is no mass, hemorrhage or extra-axial  collection. The size and configuration of the ventricles and extra-axial CSF spaces are normal. There is hypoattenuation of the white matter, most commonly indicating chronic small vessel disease. Vascular: No hyperdense vessel or unexpected vascular calcification. Skull: The visualized skull base, calvarium and extracranial soft tissues are normal. Sinuses/Orbits: No fluid levels or advanced mucosal thickening of the visualized paranasal sinuses. No mastoid or middle ear effusion. Normal orbits. Other: None. IMPRESSION: 1. No acute intracranial abnormality. 2. Chronic small vessel disease. Electronically Signed   By: Deatra Robinson M.D.   On: 04/13/2023 21:59   CT Renal Stone Study Result Date: 04/13/2023 CLINICAL DATA:  Abdominal/flank pain with stone suspected EXAM: CT ABDOMEN AND PELVIS WITHOUT CONTRAST TECHNIQUE: Multidetector CT imaging of the abdomen and pelvis was performed following the standard protocol without IV contrast. RADIATION DOSE REDUCTION: This exam was performed according to the departmental dose-optimization program which includes automated exposure control, adjustment of the mA and/or kV according to patient size and/or use of iterative reconstruction technique. COMPARISON:  02/01/2022 FINDINGS: Lower chest: Mild interlobular septal thickening at the bases, greater on the right. 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 Pacer leads which are partially covered. Hepatobiliary: Large caudate lobe but without definite surface lobulation for cirrhosis. Small cystic density in calcification in the right lobe liver which is non worrisome. Possible single high-density gallstone, area fact by streak artifact. No biliary dilatation. Pancreas: Unremarkable. Spleen: Unremarkable. Adrenals/Urinary Tract: Negative adrenals. Left hydroureteronephrosis due to a punctate  stone at the UVJ. Low-density left renal cortical expansion and perinephric stranding is attributed to the same. Clustered calculi at the poles of the right kidney measuring up to 4 mm at the upper pole. Linear calcification at the left lower pole measuring 5 mm in length. Unremarkable bladder. Stomach/Bowel:  No obstruction. No visible bowel inflammation. Vascular/Lymphatic: No acute vascular abnormality. Extensive atheromatous calcification of  the aorta. No mass or adenopathy. Reproductive:No pathologic findings. Other: No ascites or pneumoperitoneum. Musculoskeletal: Generalized lumbar spine degeneration. IMPRESSION: 1. Obstructing punctate calculus at the left UVJ. 2. Chronic findings as described above. Electronically Signed   By: Tiburcio Pea M.D.   On: 04/13/2023 07:39     Discharge Exam: Vitals:   04/14/23 2038 04/15/23 0416  BP: 130/60 (!) 142/90  Pulse: 63 (!) 59  Resp: 18 16  Temp: 98.6 F (37 C) 98.4 F (36.9 C)  SpO2: 96% 93%   Vitals:   04/14/23 0425 04/14/23 1118 04/14/23 2038 04/15/23 0416  BP: 127/79 124/63 130/60 (!) 142/90  Pulse: (!) 58 63 63 (!) 59  Resp: 14 14 18 16   Temp: (!) 97.5 F (36.4 C) 98.4 F (36.9 C) 98.6 F (37 C) 98.4 F (36.9 C)  TempSrc:  Oral Oral Oral  SpO2: 96% 95% 96% 93%  Weight:      Height:        General: Pt is alert, awake, not in acute distress Cardiovascular: RRR, S1/S2 +, no rubs, no gallops Respiratory: CTA bilaterally, no wheezing, no rhonchi Abdominal: Soft, NT, ND, bowel sounds + Extremities: no edema, no cyanosis    The results of significant diagnostics from this hospitalization (including imaging, microbiology, ancillary and laboratory) are listed below for reference.     Microbiology: No results found for this or any previous visit (from the past 240 hours).   Labs: BNP (last 3 results) Recent Labs    05/22/22 1110 05/29/22 0217  BNP 447.7* 93.6   Basic Metabolic Panel: Recent Labs  Lab 04/13/23 0658  04/14/23 0552 04/15/23 0506  NA 139 135 138  K 3.9 4.0 4.3  CL 109 108 110  CO2 17* 22 18*  GLUCOSE 246* 127* 117*  BUN 16 23 20   CREATININE 1.00 1.41* 0.89  CALCIUM 9.1 9.5 9.0   Liver Function Tests: Recent Labs  Lab 04/13/23 0658  AST 23  ALT 21  ALKPHOS 49  BILITOT 1.0  PROT 6.6  ALBUMIN 3.9   Recent Labs  Lab 04/13/23 0658  LIPASE 29   No results for input(s): "AMMONIA" in the last 168 hours. CBC: Recent Labs  Lab 04/13/23 0658 04/14/23 0552 04/15/23 0506  WBC 11.1* 11.6* 10.7*  NEUTROABS 8.5*  --   --   HGB 14.0 12.6 11.7*  HCT 41.8 37.9 34.7*  MCV 93.5 93.3 93.5  PLT 164 162 152   Cardiac Enzymes: No results for input(s): "CKTOTAL", "CKMB", "CKMBINDEX", "TROPONINI" in the last 168 hours. BNP: Invalid input(s): "POCBNP" CBG: Recent Labs  Lab 04/14/23 0718 04/14/23 1116 04/14/23 1605 04/14/23 2117 04/15/23 0743  GLUCAP 116* 120* 109* 138* 134*   D-Dimer No results for input(s): "DDIMER" in the last 72 hours. Hgb A1c Recent Labs    04/13/23 0658  HGBA1C 6.9*   Lipid Profile No results for input(s): "CHOL", "HDL", "LDLCALC", "TRIG", "CHOLHDL", "LDLDIRECT" in the last 72 hours. Thyroid function studies No results for input(s): "TSH", "T4TOTAL", "T3FREE", "THYROIDAB" in the last 72 hours.  Invalid input(s): "FREET3" Anemia work up No results for input(s): "VITAMINB12", "FOLATE", "FERRITIN", "TIBC", "IRON", "RETICCTPCT" in the last 72 hours. Urinalysis    Component Value Date/Time   COLORURINE YELLOW 04/13/2023 1049   APPEARANCEUR CLEAR 04/13/2023 1049   APPEARANCEUR Clear 02/16/2022 1316   LABSPEC 1.022 04/13/2023 1049   PHURINE 5.0 04/13/2023 1049   GLUCOSEU >=500 (A) 04/13/2023 1049   HGBUR LARGE (A) 04/13/2023 1049   BILIRUBINUR NEGATIVE 04/13/2023  1049   BILIRUBINUR Negative 02/16/2022 1316   KETONESUR 5 (A) 04/13/2023 1049   PROTEINUR 30 (A) 04/13/2023 1049   NITRITE NEGATIVE 04/13/2023 1049   LEUKOCYTESUR NEGATIVE  04/13/2023 1049   Sepsis Labs Recent Labs  Lab 04/13/23 0658 04/14/23 0552 04/15/23 0506  WBC 11.1* 11.6* 10.7*   Microbiology No results found for this or any previous visit (from the past 240 hours).  FURTHER DISCHARGE INSTRUCTIONS:   Get Medicines reviewed and adjusted: Please take all your medications with you for your next visit with your Primary MD   Laboratory/radiological data: Please request your Primary MD to go over all hospital tests and procedure/radiological results at the follow up, please ask your Primary MD to get all Hospital records sent to his/her office.   In some cases, they will be blood work, cultures and biopsy results pending at the time of your discharge. Please request that your primary care M.D. goes through all the records of your hospital data and follows up on these results.   Also Note the following: If you experience worsening of your admission symptoms, develop shortness of breath, life threatening emergency, suicidal or homicidal thoughts you must seek medical attention immediately by calling 911 or calling your MD immediately  if symptoms less severe.   You must read complete instructions/literature along with all the possible adverse reactions/side effects for all the Medicines you take and that have been prescribed to you. Take any new Medicines after you have completely understood and accpet all the possible adverse reactions/side effects.    Do not drive when taking Pain medications or sleeping medications (Benzodaizepines)   Do not take more than prescribed Pain, Sleep and Anxiety Medications. It is not advisable to combine anxiety,sleep and pain medications without talking with your primary care practitioner   Special Instructions: If you have smoked or chewed Tobacco  in the last 2 yrs please stop smoking, stop any regular Alcohol  and or any Recreational drug use.   Wear Seat belts while driving.   Please note: You were cared for by a  hospitalist during your hospital stay. Once you are discharged, your primary care physician will handle any further medical issues. Please note that NO REFILLS for any discharge medications will be authorized once you are discharged, as it is imperative that you return to your primary care physician (or establish a relationship with a primary care physician if you do not have one) for your post hospital discharge needs so that they can reassess your need for medications and monitor your lab values  Time coordinating discharge: Over 30 minutes  SIGNED:   Hughie Closs, MD  [facility] 04/15/2023, 9:27 AM *Please note that this is a verbal dictation therefore any spelling or grammatical errors are due to the "[employer] One" system interpretation. If 7PM-7AM, please contact night-coverage [email or web address]

## 2023-04-15 NOTE — Progress Notes (Addendum)
 PHARMACY - ANTICOAGULATION CONSULT NOTE  Pharmacy Consult:  Heparin Indication: chest pain/ACS  Allergies  Allergen Reactions   Bee Venom Shortness Of Breath and Rash   Claritin [Loratadine] Itching and Palpitations   Wasp Venom Anaphylaxis   Apricot Flavoring Agent (Non-Screening) Swelling and Other (See Comments)    Eyes swell shut   Atorvastatin Itching, Swelling and Other (See Comments)    Eye swelling and headaches   Losartan Itching and Rash    Patient Measurements: Height: 5\' 8"  (172.7 cm) Weight: 89.8 kg (197 lb 15.6 oz) IBW/kg (Calculated) : 63.9 Heparin Dosing Weight: 83.9 kg  Vital Signs: Temp: 98.4 F (36.9 C) (03/15 0416) Temp Source: Oral (03/15 0416) BP: 142/90 (03/15 0416) Pulse Rate: 59 (03/15 0416)  Labs: Recent Labs    04/13/23 0658 04/13/23 0834 04/13/23 1028 04/13/23 1327 04/14/23 0552 04/14/23 0921 04/14/23 1449 04/15/23 0506  HGB 14.0  --   --   --  12.6  --   --  11.7*  HCT 41.8  --   --   --  37.9  --   --  34.7*  PLT 164  --   --   --  162  --   --  152  APTT  --   --   --    < > 156*  --  69* 94*  HEPARINUNFRC  --   --   --    < > 0.90*  --  0.64 0.52  CREATININE 1.00  --   --   --  1.41*  --   --  0.89  TROPONINIHS  --  51* 104*  --   --  29*  --   --    < > = values in this interval not displayed.    Estimated Creatinine Clearance: 64.1 mL/min (by C-G formula based on SCr of 0.89 mg/dL).   Assessment: 55 YOF PMH DM2, HTN, HLD, severe aortic stenosis s/p TAVR, carotid aretery disease, AF on Eliquis. ED visit 04/13/22 with abd/flank pain for a few hours prior to arrival with 3-4 episodes of emesis. History of kidney stones with reported similar symptoms. Troponins were rising (51 >104). Cardiology consulted and started on UFH infusion + ASA 324 x1. CBC stable. No signs of bleeding. Unable to determine last dose of Eliquis. Will start UFH now with concerns for ACS.  aPTT now therapeutic at 94 and heparin level therapeutic at 0.52 on  1100 units/hr. No issue with heparin infusion and no bleeding per discussion with RN.  Goal of Therapy:  Heparin level 0.3-0.7 units/ml aPTT 66-102 seconds Monitor platelets by anticoagulation protocol: Yes   Plan:  Continue heparin infusion at 1100 units/hr Will continue to follow heparin levels as levels now correlating  Daily heparin level  Monitor CBC and for s/sx of bleeding  Thank you for involving pharmacy in the patient's care.   Theotis Burrow, PharmD PGY1 Acute Care Pharmacy Resident  04/15/2023 7:21 AM  Addendum: Pharmacy consulted to switch back to PTA eliquis. Pt is on Eliquis 5 mg BID PTA for hx of afib. Will stop heparin infusion and restart home eliquis.   Pharmacy will sign off and follow peripherally, please re-consult if needed   Thank you for involving pharmacy in the patient's care.   Theotis Burrow, PharmD PGY1 Acute Care Pharmacy Resident  04/15/2023 8:10 AM

## 2023-04-19 DIAGNOSIS — M1711 Unilateral primary osteoarthritis, right knee: Secondary | ICD-10-CM | POA: Diagnosis not present

## 2023-04-19 DIAGNOSIS — M25561 Pain in right knee: Secondary | ICD-10-CM | POA: Diagnosis not present

## 2023-04-20 DIAGNOSIS — J449 Chronic obstructive pulmonary disease, unspecified: Secondary | ICD-10-CM | POA: Diagnosis not present

## 2023-04-20 DIAGNOSIS — N179 Acute kidney failure, unspecified: Secondary | ICD-10-CM | POA: Diagnosis not present

## 2023-04-20 DIAGNOSIS — N200 Calculus of kidney: Secondary | ICD-10-CM | POA: Diagnosis not present

## 2023-04-20 DIAGNOSIS — I4821 Permanent atrial fibrillation: Secondary | ICD-10-CM | POA: Diagnosis not present

## 2023-04-20 DIAGNOSIS — I10 Essential (primary) hypertension: Secondary | ICD-10-CM | POA: Diagnosis not present

## 2023-04-20 DIAGNOSIS — F0390 Unspecified dementia without behavioral disturbance: Secondary | ICD-10-CM | POA: Diagnosis not present

## 2023-04-20 DIAGNOSIS — E1140 Type 2 diabetes mellitus with diabetic neuropathy, unspecified: Secondary | ICD-10-CM | POA: Diagnosis not present

## 2023-04-20 DIAGNOSIS — F331 Major depressive disorder, recurrent, moderate: Secondary | ICD-10-CM | POA: Diagnosis not present

## 2023-04-20 DIAGNOSIS — I5032 Chronic diastolic (congestive) heart failure: Secondary | ICD-10-CM | POA: Diagnosis not present

## 2023-04-21 ENCOUNTER — Telehealth: Payer: Self-pay

## 2023-04-21 NOTE — Telephone Encounter (Signed)
   Pre-operative Risk Assessment    Patient Name: Briana Foster  DOB: 02/09/1945 MRN: [id number]   Date of last office visit: 12/07/22 Alverda Skeans, MD Date of next office visit: NONE   Request for Surgical Clearance    Procedure:   RIGHT TOTAL KNEE ARTHROPLASTY  Date of Surgery:  Clearance 06/13/23                                Surgeon:  DR Chriss Czar Surgeon's Group or Practice Name:  [facility] Phone number:  [phone] ATTN: Rosalva Ferron Fax number:  [phone]   Type of Clearance Requested:   - Pharmacy:  Hold Apixaban (Eliquis)     Type of Anesthesia:  Spinal   Additional requests/questions:    SignedMarlow Baars   04/21/2023, 9:43 AM

## 2023-04-24 ENCOUNTER — Telehealth: Payer: Self-pay

## 2023-04-24 NOTE — Telephone Encounter (Signed)
 Spoke with who is agreeable to do a tele visit on 4/16 at 8:40 am. Med rec and consent done.

## 2023-04-24 NOTE — Telephone Encounter (Signed)
 Patient with diagnosis of Afib on Eliquis for anticoagulation.    Procedure: RIGHT TOTAL KNEE ARTHROPLASTY  Date of procedure: 06/13/2023   CHA2DS2-VASc Score = 6   This indicates a 9.7% annual risk of stroke. The patient's score is based upon: CHF History: 1 HTN History: 1 Diabetes History: 1 Stroke History: 0 Vascular Disease History: 1 Age Score: 1 Gender Score: 1  CrCl 64 mL/min Platelet count 152 K     Per office protocol, patient can hold Eliquis for 3 days prior to procedure.     **This guidance is not considered finalized until pre-operative APP has relayed final recommendations.**

## 2023-04-24 NOTE — Telephone Encounter (Signed)
  Patient Consent for Virtual Visit        Briana Foster has provided verbal consent on 04/24/2023 for a virtual visit (video or telephone).   CONSENT FOR VIRTUAL VISIT FOR:  Briana Foster  By participating in this virtual visit I agree to the following:  I hereby voluntarily request, consent and authorize [facility] and its employed or contracted physicians, physician assistants, nurse practitioners or other licensed health care professionals (the Practitioner), to provide me with telemedicine health care services (the "Services") as deemed necessary by the treating Practitioner. I acknowledge and consent to receive the Services by the Practitioner via telemedicine. I understand that the telemedicine visit will involve communicating with the Practitioner through live audiovisual communication technology and the disclosure of certain medical information by electronic transmission. I acknowledge that I have been given the opportunity to request an in-person assessment or other available alternative prior to the telemedicine visit and am voluntarily participating in the telemedicine visit.  I understand that I have the right to withhold or withdraw my consent to the use of telemedicine in the course of my care at any time, without affecting my right to future care or treatment, and that the Practitioner or I may terminate the telemedicine visit at any time. I understand that I have the right to inspect all information obtained and/or recorded in the course of the telemedicine visit and may receive copies of available information for a reasonable fee.  I understand that some of the potential risks of receiving the Services via telemedicine include:  Delay or interruption in medical evaluation due to technological equipment failure or disruption; Information transmitted may not be sufficient (e.g. poor resolution of images) to allow for appropriate medical decision making by the  Practitioner; and/or  In rare instances, security protocols could fail, causing a breach of personal health information.  Furthermore, I acknowledge that it is my responsibility to provide information about my medical history, conditions and care that is complete and accurate to the best of my ability. I acknowledge that Practitioner's advice, recommendations, and/or decision may be based on factors not within their control, such as incomplete or inaccurate data provided by me or distortions of diagnostic images or specimens that may result from electronic transmissions. I understand that the practice of medicine is not an exact science and that Practitioner makes no warranties or guarantees regarding treatment outcomes. I acknowledge that a copy of this consent can be made available to me via my patient portal [facility] MyChart), or I can request a printed copy by calling the office of [facility].    I understand that my insurance will be billed for this visit.   I have read or had this consent read to me. I understand the contents of this consent, which adequately explains the benefits and risks of the Services being provided via telemedicine.  I have been provided ample opportunity to ask questions regarding this consent and the Services and have had my questions answered to my satisfaction. I give my informed consent for the services to be provided through the use of telemedicine in my medical care

## 2023-04-24 NOTE — Telephone Encounter (Signed)
 Primary Cardiologist:Arun Dot Lanes, MD   Preoperative team, please contact this patient and set up a phone call appointment for further preoperative risk assessment. Please obtain consent and complete medication review. Thank you for your help.   I confirm that guidance regarding antiplatelet and oral anticoagulation therapy has been completed and, if necessary, noted below.  Per office protocol, patient can hold Eliquis for 3 days prior to procedure.   I also confirmed the patient resides in the state of [place]. As per [facility] Medical Board telemedicine laws, the patient must reside in the state in which the provider is licensed.   Levi Aland, NP-C  04/24/2023, 4:26 PM [address], [address] Office [phone] Fax [phone]

## 2023-04-27 DIAGNOSIS — J45901 Unspecified asthma with (acute) exacerbation: Secondary | ICD-10-CM | POA: Diagnosis not present

## 2023-04-27 DIAGNOSIS — J069 Acute upper respiratory infection, unspecified: Secondary | ICD-10-CM | POA: Diagnosis not present

## 2023-05-14 NOTE — Progress Notes (Unsigned)
 Electrophysiology Office Note:   Date:  05/15/2023  ID:  Nikole, Swartzentruber 09-Sep-1945, MRN [id number]  Primary Cardiologist: Kyra Phy, MD Primary Heart Failure: None Electrophysiologist: Will Cortland Ding, MD       History of Present Illness:   MCKYNLIE VANDERSLICE is a 78 y.o. female with h/o LBBB, HFpEF, AF, severe AS s/p TAVR, CAD, HTN, asthma, DM II, RA seen today for routine electrophysiology followup.   Since last being seen in our clinic the patient reports doing overall well. She reports her eliquis has gone up and she has difficulty filling the medication. Dr. Hussain gave her enough to get through to her next refill. Noted on her paperwork that she has been taking it once per day > she confirms she has only been taking once in the am.  She quilts in her spare time.  Denies device related concerns.  She denies chest pain, palpitations, dyspnea, PND, orthopnea, nausea, vomiting, dizziness, syncope, edema, weight gain, or early satiety.   Review of systems complete and found to be negative unless listed in HPI.   EP Information / Studies Reviewed:    EKG is not ordered today. EKG from 04/14/23 reviewed which showed AF 90 bpm      PPM Interrogation-  reviewed in detail today,  See PACEART report.  Device History: Abbott Single Chamber PPM implanted 10/28/20 for Sinus Node Dysfunction / Tachy-Brady Syndrome   Studies:  ECHO 04/2023 > LVEF 65-70%, LA severely dilated, RA mod dilated   Arrhythmia / AAD AF s/p AV Node Ablation 07/14/2021   Risk Assessment/Calculations:    CHA2DS2-VASc Score = 6   This indicates a 9.7% annual risk of stroke. The patient's score is based upon: CHF History: 1 HTN History: 1 Diabetes History: 1 Stroke History: 0 Vascular Disease History: 1 Age Score: 1 Gender Score: 1             Physical Exam:   VS:  BP 116/72   Pulse 63   Ht 5\' 8"  (1.727 m)   Wt 201 lb 9.6 oz (91.4 kg)   SpO2 97%   BMI 30.65 kg/m    Wt Readings from  Last 3 Encounters:  05/15/23 201 lb 9.6 oz (91.4 kg)  04/13/23 197 lb 15.6 oz (89.8 kg)  12/07/22 208 lb (94.3 kg)     GEN: Well nourished, well developed in no acute distress NECK: No JVD; No carotid bruits CARDIAC: Regular rate and rhythm, no murmurs, rubs, gallops, device site well healed, no tethering RESPIRATORY:  Clear to auscultation without rales, wheezing or rhonchi  ABDOMEN: Soft, non-tender, non-distended EXTREMITIES:  1+ BLE edema; No deformity   ASSESSMENT AND PLAN:    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 Discussed all the OAC's are very expensive and she can check with her insurance to see if one is covered better than the other an let us  know (we can switch her if  one is covered better)  CAD  VHD: Severe AS s/p TAVR  -per primary Cardiology   Disposition:   Follow up with  Dr. Lawana Pray in 12 months  Signed, Creighton Doffing, NP-C, AGACNP-BC [facility] - Electrophysiology  05/15/2023, 8:37 AM

## 2023-05-15 ENCOUNTER — Encounter: Payer: Self-pay | Admitting: Pulmonary Disease

## 2023-05-15 ENCOUNTER — Ambulatory Visit: Attending: Pulmonary Disease | Admitting: Pulmonary Disease

## 2023-05-15 VITALS — BP 116/72 | HR 63 | Ht 68.0 in | Wt 201.6 lb

## 2023-05-15 DIAGNOSIS — I4821 Permanent atrial fibrillation: Secondary | ICD-10-CM

## 2023-05-15 DIAGNOSIS — Z950 Presence of cardiac pacemaker: Secondary | ICD-10-CM

## 2023-05-15 DIAGNOSIS — I495 Sick sinus syndrome: Secondary | ICD-10-CM | POA: Diagnosis not present

## 2023-05-15 DIAGNOSIS — D6869 Other thrombophilia: Secondary | ICD-10-CM | POA: Diagnosis not present

## 2023-05-15 LAB — CUP PACEART INCLINIC DEVICE CHECK
Date Time Interrogation Session: 20250414082738
Implantable Lead Connection Status: 753985
Implantable Lead Implant Date: 20220928
Implantable Lead Location: 753860
Implantable Pulse Generator Implant Date: 20220928
Pulse Gen Model: 1272
Pulse Gen Serial Number: 3937998

## 2023-05-15 NOTE — Patient Instructions (Signed)
 Medication Instructions:  1.Be sure to take your eliquis 5 mg TWICE daily 2.Call your insurance company to see if xarelto would be a cheaper option for you *If you need a refill on your cardiac medications before your next appointment, please call your pharmacy*  Lab Work: None ordered If you have labs (blood work) drawn today and your tests are completely normal, you will receive your results only by: MyChart Message (if you have MyChart) OR A paper copy in the mail If you have any lab test that is abnormal or we need to change your treatment, we will call you to review the results.  Follow-Up: At [facility], you and your health needs are our priority.  As part of our continuing mission to provide you with exceptional heart care, our providers are all part of one team.  This team includes your primary Cardiologist (physician) and Advanced Practice Providers or APPs (Physician Assistants and Nurse Practitioners) who all work together to provide you with the care you need, when you need it.  Your next appointment:   1 year(s)  Provider:   Agatha Horsfall, MD      1st Floor: - Lobby - Registration  - Pharmacy  - Lab - Cafe  2nd Floor: - PV Lab - Diagnostic Testing (echo, CT, nuclear med)  3rd Floor: - Vacant  4th Floor: - TCTS (cardiothoracic surgery) - AFib Clinic - Structural Heart Clinic - Vascular Surgery  - Vascular Ultrasound  5th Floor: - HeartCare Cardiology (general and EP) - Clinical Pharmacy for coumadin, hypertension, lipid, weight-loss medications, and med management appointments    Valet parking services will be available as well.

## 2023-05-16 ENCOUNTER — Encounter: Payer: Self-pay | Admitting: Cardiology

## 2023-05-17 ENCOUNTER — Ambulatory Visit: Attending: Nurse Practitioner

## 2023-05-17 DIAGNOSIS — Z01810 Encounter for preprocedural cardiovascular examination: Secondary | ICD-10-CM | POA: Diagnosis not present

## 2023-05-17 NOTE — Progress Notes (Signed)
 Virtual Visit via Telephone Note   Because of Briana Foster Ibbotson co-morbid illnesses, she is at least at moderate risk for complications without adequate follow up.  This format is felt to be most appropriate for this patient at this time.  Due to technical limitations with video connection (technology), today's appointment will be conducted as an audio only telehealth visit, and Briana Foster verbally agreed to proceed in this manner.   All issues noted in this document were discussed and addressed.  No physical exam could be performed with this format.  Evaluation Performed:  Preoperative cardiovascular risk assessment _____________   Date:  05/17/2023   Patient ID:  Briana Foster, DOB [birth year], MRN [id number] Patient Location:  Home Provider location:   Office  Primary Care Provider:  Georgann Housekeeper, MD Primary Cardiologist:  Orbie Pyo, MD  Chief Complaint / Patient Profile   78 y.o. 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  History of Present Illness    Briana Foster is a 78 y.o. female who presents via audio/video conferencing for a telehealth visit today.  Pt was last seen in cardiology clinic on 05/15/2023 by Canary Brim, NP.  At that time KARYSA HEFT was doing well with no new cardiac complaints.  She was previously seen in the ED on 04/13/2023 with complaint of chest pain and elevated troponin which were found to be in the setting of demand ischemia. Renal CT did reveal obstructive punctate calculus in the left UPJ. The patient is now pending procedure as outlined above. Since her last visit, she has  been doing well with no new cardiac complaints.  She reports that her discomfort was primarily from a kidney stone and pain had resolved prior to discharge from the ED.  She denies chest pain, shortness of breath, lower extremity edema, fatigue, palpitations, melena, hematuria, hemoptysis, diaphoresis, weakness, presyncope, syncope, orthopnea, and PND.    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 Clifton James & Dr. Cornelius Moras   Severe aortic stenosis    Type 2 diabetes mellitus without complication, without long-term current use of insulin (HCC) 04/10/2018   Vitamin D deficiency    Past Surgical History:  Procedure Laterality Date   AV NODE ABLATION N/A 07/14/2021   Procedure: AV NODE ABLATION;  Surgeon: Regan Lemming, MD;  Location: [facility] INVASIVE CV LAB;  Service: Cardiovascular;  Laterality: N/A;   BUNIONECTOMY  20 yrs ago   bil feet  BUNIONECTOMY  11/30/2011   Procedure: Tillman Folks;  Surgeon: Verlinda Gloss, MD;  Location: [facility] ORS;  Service: Orthopedics;  Laterality: Bilateral;  RIGHT FOOT EXCISION OF BUNIONETTE AND PARTIAL   PROXIMAL PHALANGECTOMY OF 5TH TOE LEFT FOOT FUNK BUNIONECTOMY,EXCISION OF BUNIONETTE    CARDIOVERSION N/A 11/05/2019   Procedure: CARDIOVERSION;  Surgeon: Lenise Quince, MD;  Location: [facility] ENDOSCOPY;  Service: Cardiovascular;  Laterality: N/A;   CARDIOVERSION  N/A 12/05/2019   Procedure: CARDIOVERSION;  Surgeon: Euell Herrlich, MD;  Location: [facility] ENDOSCOPY;  Service: Cardiovascular;  Laterality: N/A;   COLONOSCOPY WITH PROPOFOL N/A 08/07/2012   Procedure: COLONOSCOPY WITH PROPOFOL;  Surgeon: Garrett Kallman, MD;  Location: [facility] ENDOSCOPY;  Service: Endoscopy;  Laterality: N/A;   MOUTH SURGERY     teeth extractions   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   Procedure: PACEMAKER IMPLANT;  Surgeon: Lei Pump, MD;  Location: [facility] INVASIVE CV LAB;  Service: Cardiovascular;  Laterality: N/A;   RIGHT/LEFT HEART CATH AND CORONARY ANGIOGRAPHY N/A 06/17/2020   Procedure: RIGHT/LEFT HEART CATH AND CORONARY ANGIOGRAPHY;  Surgeon: Arty Binning, MD;  Location: [facility] INVASIVE CV LAB;  Service: Cardiovascular;  Laterality: N/A;   TONSILLECTOMY     TRANSCATHETER AORTIC VALVE REPLACEMENT, TRANSFEMORAL N/A 06/30/2020   Procedure: TRANSCATHETER AORTIC VALVE REPLACEMENT, TRANSFEMORAL;  Surgeon: Odie Benne, MD;  Location: [facility] INVASIVE CV LAB;  Service: Open Heart Surgery;  Laterality: N/A;   TUBAL LIGATION     WRIST SURGERY      Allergies  Allergies  Allergen Reactions   Bee Venom Shortness Of Breath and Rash   Claritin [Loratadine] Itching and Palpitations   Wasp Venom Anaphylaxis   Apricot Flavoring Agent (Non-Screening) Swelling and Other (See Comments)    Eyes swell shut   Atorvastatin Itching, Swelling and Other (See Comments)    Eye swelling and headaches   Losartan Itching and Rash    Home Medications    Prior to Admission medications   Medication Sig Start Date End Date Taking? Authorizing Provider  Acetaminophen 500 MG capsule Take 500-1,000 mg by mouth every 8 (eight) hours as needed (for pain or headaches).    [provider]  albuterol (VENTOLIN HFA) 108 (90 Base) MCG/ACT inhaler Inhale 2 puffs into the lungs every 4 (four) hours as needed for wheezing or shortness of breath. 05/30/22   Ghimire, Estil Heman, MD  amLODipine  (NORVASC) 10 MG tablet Take 10 mg by mouth daily.    [provider]  apixaban (ELIQUIS) 5 MG TABS tablet Take 1 tablet (5 mg total) by mouth 2 (two) times daily. Patient taking differently: Take 5 mg by mouth in the morning. 06/23/22   Camnitz, Babetta Lesch, MD  budesonide-formoterol [facility]) 160-4.5 MCG/ACT inhaler Inhale 2 puffs into the lungs 2 (two) times daily. 05/30/22   Ghimire, Estil Heman, MD  Continuous Glucose Sensor (DEXCOM G7 SENSOR) MISC Inject 1 Device into the skin See admin instructions. Place 1 new sensor into the skin every 10 days Patient not taking: Reported on 05/15/2023    [provider]  glimepiride (AMARYL) 2 MG tablet Take 2 mg by mouth daily with breakfast.    [provider]  JARDIANCE 10 MG TABS tablet Take 10 mg by mouth daily. 12/09/22   [provider]  meclizine (ANTIVERT) 25 MG tablet Take 25 mg by mouth 2 (two) times daily as needed for dizziness. 09/09/22   [provider]  memantine (NAMENDA) 5 MG tablet Take 5 mg by mouth in  the morning.    [provider]  metFORMIN (GLUCOPHAGE) 500 MG tablet Take 500 mg by mouth daily with breakfast. 12/09/22   [provider]  Multiple Vitamins-Minerals (CENTRUM SILVER 50+WOMEN) TABS Take 1 tablet by mouth daily with breakfast.    [provider]  olmesartan (BENICAR) 20 MG tablet Take 20 mg by mouth daily. 03/29/22   [provider]  rOPINIRole (REQUIP) 4 MG tablet Take 4 mg by mouth See admin instructions. Take 4 mg by mouth at 8 PM    [provider]  rosuvastatin (CRESTOR) 10 MG tablet Take 1 tablet (10 mg total) by mouth daily. 12/07/22   Thukkani, Arun K, MD  traMADol (ULTRAM) 50 MG tablet Take 50-100 mg by mouth every 6 (six) hours as needed (for pain).    [provider]    Physical Exam    Vital Signs:  Shelbia Foster Matarese does not have vital signs available for review today.  Given telephonic nature of communication,  physical exam is limited. AAOx3. NAD. Normal affect.  Speech and respirations are unlabored.  Accessory Clinical Findings    None  Assessment & Plan    1.  Preoperative Cardiovascular Risk Assessment: - Patient's RCRI score is 0.9%  The patient affirms she has been doing well without any new cardiac symptoms. They are able to achieve 5 METS without cardiac limitations. Therefore, based on ACC/AHA guidelines, the patient would be at acceptable risk for the planned procedure without further cardiovascular testing. The patient was advised that if she develops new symptoms prior to surgery to contact our office to arrange for a follow-up visit, and she verbalized understanding.   The patient was advised that if she develops new symptoms prior to surgery to contact our office to arrange for a follow-up visit, and she verbalized understanding.  Per office protocol, patient can hold Eliquis for 3 days prior to procedure.    A copy of this note will be routed to requesting surgeon.  Time:   Today, I have spent 7 minutes with the patient with telehealth technology discussing medical history, symptoms, and management plan.     Francene Ing, Retha Cast, NP  05/17/2023, 7:00 AM

## 2023-05-19 DIAGNOSIS — M6281 Muscle weakness (generalized): Secondary | ICD-10-CM | POA: Diagnosis not present

## 2023-05-22 ENCOUNTER — Ambulatory Visit: Payer: Medicare Other

## 2023-05-25 NOTE — Progress Notes (Signed)
 Surgery orders requested via Epic inbox.

## 2023-05-29 ENCOUNTER — Encounter: Payer: Self-pay | Admitting: Cardiology

## 2023-05-29 ENCOUNTER — Ambulatory Visit (INDEPENDENT_AMBULATORY_CARE_PROVIDER_SITE_OTHER): Payer: Medicare Other

## 2023-05-29 DIAGNOSIS — I495 Sick sinus syndrome: Secondary | ICD-10-CM

## 2023-05-29 DIAGNOSIS — I4821 Permanent atrial fibrillation: Secondary | ICD-10-CM

## 2023-05-29 LAB — CUP PACEART REMOTE DEVICE CHECK
Battery Remaining Longevity: 118 mo
Battery Remaining Percentage: 84 %
Battery Voltage: 3.04 V
Brady Statistic RV Percent Paced: 99 %
Date Time Interrogation Session: 20250428075658
Implantable Lead Connection Status: 753985
Implantable Lead Implant Date: 20220928
Implantable Lead Location: 753860
Implantable Pulse Generator Implant Date: 20220928
Lead Channel Impedance Value: 450 Ohm
Lead Channel Pacing Threshold Amplitude: 0.625 V
Lead Channel Pacing Threshold Pulse Width: 0.4 ms
Lead Channel Sensing Intrinsic Amplitude: 9.7 mV
Lead Channel Setting Pacing Amplitude: 0.875
Lead Channel Setting Pacing Pulse Width: 0.4 ms
Lead Channel Setting Sensing Sensitivity: 2 mV
Pulse Gen Model: 1272
Pulse Gen Serial Number: 3937998

## 2023-05-31 NOTE — Progress Notes (Addendum)
 COVID Vaccine received:  []  No [x]  Yes Date of any COVID positive Test in last 90 days:  None  PCP - Jearldine Mina, MD cleared per Kim Pen, PA's note Cardiologist - Alyssa Backbone, MD   Charles Connor, NP  EP- Agatha Horsfall, MD   Chest x-ray - 05-29-2022  1v  Epic EKG -  04-14-2023  Epic Stress Test -  ECHO - 04-14-2023  Epic Cardiac Cath - 06-18-2023 [facility]   Epic  Pacemaker / ICD device []  No [x]  Yes  ABBOTT- Single chamber PACEMAKER ASSURITY SR-SF - [id number]   last checked: 05-29-2023  device orders sent to [facility]  Spinal Cord Stimulator:[x]  No []  Yes       History of Sleep Apnea? [x]  No []  Yes   CPAP used?- [x]  No []  Yes    Does the patient monitor blood sugar?   []  N/A   []  No [x]  Yes  Patient has: []  NO Hx DM   []  Pre-DM   []  DM1  [x]   DM2 Last A1c was: 6.9  on 04-13-2023    Does patient have a [employer] or Dexcom? [x]  No []  Yes   Fasting Blood Sugar Ranges-  Checks Blood Sugar _1-2_ times a   Metformin -  500 mg q am    Hold DOS Jardiance- 10 mg po daily     Hold x 72 hours Glimepiride- 2 mg po q am    Hold DOS  Blood Thinner / Instructions:  Eliquis   bid,  hold x 3 days  ok'd with cardiology.  Aspirin  Instructions:  none  ERAS Protocol Ordered: []  No  [x]  Yes PRE-SURGERY []  ENSURE  [x]  G2    Patient is to be NPO after: 1115  Dental hx: [x]  Dentures:   Full set of dentures []  N/A      []  Bridge or Partial:                   []  Loose or Damaged teeth:   Comments: Patient was given the 5 CHG shower / bath instructions for TKA  surgery along with 2 bottles of the CHG soap. Patient will start this on:  06-09-2023            Activity level: Patient is unable to climb a flight of stairs without difficulty; [x]  No CP  [x]  No SOB, but would have leg pain.Patient can perform ADLs without assistance.   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  Patient verbalized understanding and agreement to the Pre-Surgical Instructions that were given to them at this PAT appointment. Patient was also educated of the need to review these PAT instructions again prior to her surgery.I reviewed the appropriate phone numbers to call if they have any and questions or concerns.

## 2023-05-31 NOTE — Patient Instructions (Signed)
 SURGICAL WAITING ROOM VISITATION Patients having surgery or a procedure may have no more than 2 support people in the waiting area - these visitors may rotate in the visitor waiting room.   If the patient needs to stay at the hospital during part of their recovery, the visitor guidelines for inpatient rooms apply.  PRE-OP VISITATION  Pre-op nurse will coordinate an appropriate time for 1 support person to accompany the patient in pre-op.  This support person may not rotate.  This visitor will be contacted when the time is appropriate for the visitor to come back in the pre-op area.  Please refer to the [facility] website for the visitor guidelines for Inpatients (after your surgery is over and you are in a regular room).  You are not required to quarantine at this time prior to your surgery. However, you must do this: Hand Hygiene often Do NOT share personal items Notify your provider if you are in close contact with someone who has COVID or you develop fever 100.4 or greater, new onset of sneezing, cough, sore throat, shortness of breath or body aches.  If you test positive for Covid or have been in contact with anyone that has tested positive in the last 10 days please notify you surgeon.    Your procedure is scheduled on:   TUESDAY  Jun 13, 2023   Report to [facility] Main Entrance: [address] entrance where the [employer] is available.   Report to admitting at:  11:45   AM  Call this number if you have any questions or problems the morning of surgery [phone]  Do not eat food after Midnight the night prior to your surgery/procedure.  After Midnight you may have the following liquids until  11:15  AM  DAY OF SURGERY  Clear Liquid Diet Water Black Coffee (sugar ok, NO MILK/CREAM OR CREAMERS)  Tea (sugar ok, NO MILK/CREAM OR CREAMERS) regular and decaf                             Plain Jell-O  with no fruit (NO RED)                                           Fruit  ices (not with fruit pulp, NO RED)                                     Popsicles (NO RED)                                                                  Juice: NO CITRUS JUICES: only apple, WHITE grape, WHITE cranberry Sports drinks like Gatorade or Powerade (NO RED)                   The day of surgery:  Drink ONE (1) Pre-Surgery  G2 at   11:15 AM the morning of surgery. Drink in one sitting. Do not sip.  This drink was given to you during your hospital pre-op appointment visit. Nothing  else to drink after completing the Pre-Surgery G2 : No candy, chewing gum or throat lozenges.    FOLLOW ANY ADDITIONAL PRE OP INSTRUCTIONS YOU RECEIVED FROM YOUR SURGEON'S OFFICE!!!   Oral Hygiene is also important to reduce your risk of infection.        Remember - BRUSH YOUR TEETH THE MORNING OF SURGERY WITH YOUR REGULAR TOOTHPASTE  Do NOT smoke after Midnight the night before surgery.  STOP TAKING all Vitamins, Herbs and supplements 1 week before your surgery.   Metformin -  500 mg q am    Day before surgery; take as usual.   DO NOT TAKE ON THE DAY OF YOUR SURGERY.   Jardiance- 10 mg po daily   Do not take Jardiance for 72 hours before surgery.  Last dose will be taken on Friday 06-09-2023  Glimepiride- 2 mg po q am    Day before surgery; take as usual.   DO NOT TAKE ON THE DAY OF YOUR SURGERY.     Eliquis     Do not take Eliquis  for 72 hours before surgery.  Last dose will be taken on Friday 06-09-2023  Take ONLY these medicines the morning of surgery with A SIP OF WATER: Memantine , Amlodipine , and Tylenol  OR Tramadol  for pain if needed. You may use your Symbicort  and Albuterol  inhalers if needed.  Please bring your Albuterol  inhaler with you on the surgery day.    You may not have any metal on your body including hair pins, jewelry, and body piercing  Do not wear make-up, lotions, powders, perfumes or deodorant  Do not wear nail polish including gel and S&S, artificial / acrylic nails, or any  other type of covering on natural nails including finger and toenails. If you have artificial nails, gel coating, etc., that needs to be removed by a nail salon, Please have this removed prior to surgery. Not doing so may mean that your surgery could be cancelled or delayed if the Surgeon or anesthesia staff feels like they are unable to monitor you safely.   Do not shave 48 hours prior to surgery to avoid nicks in your skin which may contribute to postoperative infections.   Contacts, Hearing Aids, dentures or bridgework may not be worn into surgery. DENTURES WILL BE REMOVED PRIOR TO SURGERY PLEASE DO NOT APPLY "Poly grip" OR ADHESIVES!!!  You may bring a small overnight bag with you on the day of surgery, only pack items that are not valuable. [facility] IS NOT RESPONSIBLE   FOR VALUABLES THAT ARE LOST OR STOLEN.   Do not bring your home medications to the hospital. The Pharmacy will dispense medications listed on your medication list to you during your admission in the Hospital.  Special Instructions: Bring a copy of your healthcare power of attorney and living will documents the day of surgery, if you wish to have them scanned into your [facility] Records- EPIC  Please read over the following fact sheets you were given: IF YOU HAVE QUESTIONS ABOUT YOUR PRE-OP INSTRUCTIONS, PLEASE CALL [phone].     Pre-operative 5 CHG Bath Instructions   You can play a key role in reducing the risk of infection after surgery. Your skin needs to be as free of germs as possible. You can reduce the number of germs on your skin by washing with CHG (chlorhexidine  gluconate) soap before surgery. CHG is an antiseptic soap that kills germs and continues to kill germs even after washing.   DO NOT use if you have  an allergy to chlorhexidine /CHG or antibacterial soaps. If your skin becomes reddened or irritated, stop using the CHG and notify one of our RNs at [phone]  Please shower with the CHG soap  starting 4 days before surgery using the following schedule: START SHOWERS ON   FRIDAY  Jun 09, 2023                                                                                                                                                                              Please keep in mind the following:  DO NOT shave, including legs and underarms, starting the day of your first shower.   You may shave your face at any point before/day of surgery.   Place clean sheets on your bed the day you start using CHG soap. Use a clean washcloth (not used since being washed) for each shower. DO NOT sleep with pets once you start using the CHG.   CHG Shower Instructions:  If you choose to wash your hair and private area, wash first with your normal shampoo/soap.  After you use shampoo/soap, rinse your hair and body thoroughly to remove shampoo/soap residue.  Turn the water OFF and apply about 3 tablespoons (45 ml) of CHG soap to a CLEAN washcloth.  Apply CHG soap ONLY FROM YOUR NECK DOWN TO YOUR TOES (washing for 3-5 minutes)  DO NOT use CHG soap on face, private areas, open wounds, or sores.  Pay special attention to the area where your surgery is being performed.  If you are having back surgery, having someone wash your back for you may be helpful.  Wait 2 minutes after CHG soap is applied, then you may rinse off the CHG soap.  Pat dry with a clean towel  Put on clean clothes/pajamas   If you choose to wear lotion, please use ONLY the CHG-compatible lotions on the back of this paper.     Additional instructions for the day of surgery: DO NOT APPLY any lotions, deodorants, cologne, or perfumes.   Put on clean/comfortable clothes.  Brush your teeth.  Ask your nurse before applying any prescription medications to the skin.      CHG Compatible Lotions   Aveeno Moisturizing lotion  Cetaphil Moisturizing Cream  Cetaphil Moisturizing Lotion  Clairol Herbal Essence Moisturizing Lotion, Dry  Skin  Clairol Herbal Essence Moisturizing Lotion, Extra Dry Skin  Clairol Herbal Essence Moisturizing Lotion, Normal Skin  Curel Age Defying Therapeutic Moisturizing Lotion with Alpha Hydroxy  Curel Extreme Care Body Lotion  Curel Soothing Hands Moisturizing Hand Lotion  Curel Therapeutic Moisturizing Cream, Fragrance-Free  Curel Therapeutic Moisturizing Lotion, Fragrance-Free  Curel Therapeutic Moisturizing Lotion, Original Formula  Eucerin Daily Replenishing Lotion  Eucerin Dry Skin Therapy Plus Alpha Hydroxy Crme  Eucerin Dry Skin Therapy Plus Alpha Hydroxy Lotion  Eucerin Original Crme  Eucerin Original Lotion  Eucerin Plus Crme Eucerin Plus Lotion  Eucerin TriLipid Replenishing Lotion  Keri Anti-Bacterial Hand Lotion  Keri Deep Conditioning Original Lotion Dry Skin Formula Softly Scented  Keri Deep Conditioning Original Lotion, Fragrance Free Sensitive Skin Formula  Keri Lotion Fast Absorbing Fragrance Free Sensitive Skin Formula  Keri Lotion Fast Absorbing Softly Scented Dry Skin Formula  Keri Original Lotion  Keri Skin Renewal Lotion Keri Silky Smooth Lotion  Keri Silky Smooth Sensitive Skin Lotion  Nivea Body Creamy Conditioning Oil  Nivea Body Extra Enriched Lotion  Nivea Body Original Lotion  Nivea Body Sheer Moisturizing Lotion Nivea Crme  Nivea Skin Firming Lotion  NutraDerm 30 Skin Lotion  NutraDerm Skin Lotion  NutraDerm Therapeutic Skin Cream  NutraDerm Therapeutic Skin Lotion  ProShield Protective Hand Cream  Provon moisturizing lotion   FAILURE TO FOLLOW THESE INSTRUCTIONS MAY RESULT IN THE CANCELLATION OF YOUR SURGERY  PATIENT SIGNATURE_________________________________  NURSE SIGNATURE__________________________________  ________________________________________________________________________        Benjamen Brand    An incentive spirometer is a tool that can help keep your lungs clear and active. This tool measures how well you are  filling your lungs with each breath. Taking long deep breaths may help reverse or decrease the chance of developing breathing (pulmonary) problems (especially infection) following: A long period of time when you are unable to move or be active. BEFORE THE PROCEDURE  If the spirometer includes an indicator to show your best effort, your nurse or respiratory therapist will set it to a desired goal. If possible, sit up straight or lean slightly forward. Try not to slouch. Hold the incentive spirometer in an upright position. INSTRUCTIONS FOR USE  Sit on the edge of your bed if possible, or sit up as far as you can in bed or on a chair. Hold the incentive spirometer in an upright position. Breathe out normally. Place the mouthpiece in your mouth and seal your lips tightly around it. Breathe in slowly and as deeply as possible, raising the piston or the ball toward the top of the column. Hold your breath for 3-5 seconds or for as long as possible. Allow the piston or ball to fall to the bottom of the column. Remove the mouthpiece from your mouth and breathe out normally. Rest for a few seconds and repeat Steps 1 through 7 at least 10 times every 1-2 hours when you are awake. Take your time and take a few normal breaths between deep breaths. The spirometer may include an indicator to show your best effort. Use the indicator as a goal to work toward during each repetition. After each set of 10 deep breaths, practice coughing to be sure your lungs are clear. If you have an incision (the cut made at the time of surgery), support your incision when coughing by placing a pillow or rolled up towels firmly against it. Once you are able to get out of bed, walk around indoors and cough well. You may stop using the incentive spirometer when instructed by your caregiver.  RISKS AND COMPLICATIONS Take your time so you do not get dizzy or light-headed. If you are in pain, you may need to take or ask for pain  medication before doing incentive spirometry. It is harder to take a deep breath if you are having pain. AFTER USE Rest and breathe slowly and easily. It can  be helpful to keep track of a log of your progress. Your caregiver can provide you with a simple table to help with this. If you are using the spirometer at home, follow these instructions: SEEK MEDICAL CARE IF:  You are having difficultly using the spirometer. You have trouble using the spirometer as often as instructed. Your pain medication is not giving enough relief while using the spirometer. You develop fever of 100.5 F (38.1 C) or higher.                                                                                                    SEEK IMMEDIATE MEDICAL CARE IF:  You cough up bloody sputum that had not been present before. You develop fever of 102 F (38.9 C) or greater. You develop worsening pain at or near the incision site. MAKE SURE YOU:  Understand these instructions. Will watch your condition. Will get help right away if you are not doing well or get worse. Document Released: 05/30/2006 Document Revised: 04/11/2011 Document Reviewed: 07/31/2006 [facility] Patient Information 2014 [place], [place].        If you would like to see a video about joint replacement:   [email or web address]

## 2023-06-01 ENCOUNTER — Other Ambulatory Visit: Payer: Self-pay

## 2023-06-01 ENCOUNTER — Encounter (HOSPITAL_COMMUNITY)
Admission: RE | Admit: 2023-06-01 | Discharge: 2023-06-01 | Disposition: A | Discharge status: Home / Self Care | Source: Ambulatory Visit | Attending: Orthopedic Surgery | Admitting: Orthopedic Surgery

## 2023-06-01 ENCOUNTER — Encounter (HOSPITAL_COMMUNITY): Payer: Self-pay

## 2023-06-01 VITALS — BP 139/57 | HR 60 | Temp 98.2°F | Resp 16 | Ht 68.0 in | Wt 195.0 lb

## 2023-06-01 DIAGNOSIS — I130 Hypertensive heart and chronic kidney disease with heart failure and stage 1 through stage 4 chronic kidney disease, or unspecified chronic kidney disease: Secondary | ICD-10-CM | POA: Insufficient documentation

## 2023-06-01 DIAGNOSIS — Z7901 Long term (current) use of anticoagulants: Secondary | ICD-10-CM | POA: Insufficient documentation

## 2023-06-01 DIAGNOSIS — M1711 Unilateral primary osteoarthritis, right knee: Secondary | ICD-10-CM | POA: Insufficient documentation

## 2023-06-01 DIAGNOSIS — Z01818 Encounter for other preprocedural examination: Secondary | ICD-10-CM

## 2023-06-01 DIAGNOSIS — E1122 Type 2 diabetes mellitus with diabetic chronic kidney disease: Secondary | ICD-10-CM | POA: Insufficient documentation

## 2023-06-01 DIAGNOSIS — Z7984 Long term (current) use of oral hypoglycemic drugs: Secondary | ICD-10-CM | POA: Insufficient documentation

## 2023-06-01 DIAGNOSIS — E119 Type 2 diabetes mellitus without complications: Secondary | ICD-10-CM

## 2023-06-01 DIAGNOSIS — I509 Heart failure, unspecified: Secondary | ICD-10-CM | POA: Diagnosis not present

## 2023-06-01 DIAGNOSIS — Z952 Presence of prosthetic heart valve: Secondary | ICD-10-CM | POA: Diagnosis not present

## 2023-06-01 DIAGNOSIS — N183 Chronic kidney disease, stage 3 unspecified: Secondary | ICD-10-CM | POA: Insufficient documentation

## 2023-06-01 DIAGNOSIS — I447 Left bundle-branch block, unspecified: Secondary | ICD-10-CM | POA: Insufficient documentation

## 2023-06-01 DIAGNOSIS — Z01812 Encounter for preprocedural laboratory examination: Secondary | ICD-10-CM | POA: Diagnosis not present

## 2023-06-01 DIAGNOSIS — Z950 Presence of cardiac pacemaker: Secondary | ICD-10-CM | POA: Diagnosis not present

## 2023-06-01 DIAGNOSIS — I2510 Atherosclerotic heart disease of native coronary artery without angina pectoris: Secondary | ICD-10-CM | POA: Insufficient documentation

## 2023-06-01 DIAGNOSIS — I4891 Unspecified atrial fibrillation: Secondary | ICD-10-CM | POA: Insufficient documentation

## 2023-06-01 HISTORY — DX: Atherosclerotic heart disease of native coronary artery without angina pectoris: I25.10

## 2023-06-01 HISTORY — DX: Heart failure, unspecified: I50.9

## 2023-06-01 HISTORY — DX: Personal history of urinary calculi: Z87.442

## 2023-06-01 HISTORY — DX: Chronic kidney disease, unspecified: N18.9

## 2023-06-01 HISTORY — DX: Cardiac arrhythmia, unspecified: I49.9

## 2023-06-01 LAB — CBC
HCT: 39.5 % (ref 36.0–46.0)
Hemoglobin: 12.8 g/dL (ref 12.0–15.0)
MCH: 31.9 pg (ref 26.0–34.0)
MCHC: 32.4 g/dL (ref 30.0–36.0)
MCV: 98.5 fL (ref 80.0–100.0)
Platelets: 220 10*3/uL (ref 150–400)
RBC: 4.01 MIL/uL (ref 3.87–5.11)
RDW: 13 % (ref 11.5–15.5)
WBC: 6.6 10*3/uL (ref 4.0–10.5)
nRBC: 0 % (ref 0.0–0.2)

## 2023-06-01 LAB — BASIC METABOLIC PANEL WITH GFR
Anion gap: 7 (ref 5–15)
BUN: 17 mg/dL (ref 8–23)
CO2: 22 mmol/L (ref 22–32)
Calcium: 9.5 mg/dL (ref 8.9–10.3)
Chloride: 110 mmol/L (ref 98–111)
Creatinine, Ser: 0.64 mg/dL (ref 0.44–1.00)
GFR, Estimated: >60 mL/min (ref >60)
Glucose, Bld: 134 mg/dL — ABNORMAL HIGH (ref 70–99)
Potassium: 3.9 mmol/L (ref 3.5–5.1)
Sodium: 139 mmol/L (ref 135–145)

## 2023-06-01 LAB — SURGICAL PCR SCREEN
MRSA, PCR: NEGATIVE
Staphylococcus aureus: NEGATIVE

## 2023-06-01 LAB — GLUCOSE, CAPILLARY: Glucose-Capillary: 139 mg/dL — ABNORMAL HIGH (ref 70–99)

## 2023-06-01 NOTE — Progress Notes (Signed)
 PERIOPERATIVE PRESCRIPTION FOR IMPLANTED CARDIAC DEVICE PROGRAMMING  Patient Information: Name:  Briana Foster  DOB:  [birth year]  MRN:  [id number]    Planned Procedure:  Right Total knee arthroplasty  Surgeon: Dr. Claiborne Crew  Date of Procedure: 06-13-2023  Cautery will be used.  Position during surgery: Supine   ABBOTT- Single chamber PACEMAKER ASSURITY SR-SF - [id number]    Please send documentation back to:  [facility] (Fax# [phone])  Device Information:  Clinic EP Physician:  Dr. Harvie Liner   Device Type:  Pacemaker Manufacturer and Phone #:  St. Jude/Abbott: [phone] Pacemaker Dependent?:  Yes.   Date of Last Device Check:  05/29/23  Normal Device Function?:  Yes.    Electrophysiologist's Recommendations:  Have magnet available. Provide continuous ECG monitoring when magnet is used or reprogramming is to be performed.  Procedure should not interfere with device function.  No device programming or magnet placement needed.  Per Device Clinic Standing Orders, Glorianne Largo, RN  2:08 PM 06/01/2023

## 2023-06-02 NOTE — Progress Notes (Signed)
 Anesthesia Chart Review   Case: [id number] Date/Time: 06/13/23 1335   Procedure: ARTHROPLASTY, KNEE, TOTAL (Right: Knee)   Anesthesia type: Spinal   Diagnosis: Primary osteoarthritis of right knee [M17.11]   Pre-op diagnosis: Right knee osteoarthritis   Location: [facility] ROOM 10 / [facility]   Surgeons: Claiborne Crew, MD       DISCUSSION:77 y.o. never smoker with h/o HTN, LBBB, atrial fibrillation, TAVR 2022, CHF, CAD, pacemaker in place (device orders in 06/01/23 progress note), CKD Stage III, DM II (A1C 6.9), right knee OA scheduled for above procedure 06/13/2023 with Dr. Claiborne Crew.   Per cardiology preoperative evaluation 05/17/2023, "- Patient's RCRI score is 0.9%   The patient affirms she has been doing well without any new cardiac symptoms. They are able to achieve 5 METS without cardiac limitations. Therefore, based on ACC/AHA guidelines, the patient would be at acceptable risk for the planned procedure without further cardiovascular testing. The patient was advised that if she develops new symptoms prior to surgery to contact our office to arrange for a follow-up visit, and she verbalized understanding.    The patient was advised that if she develops new symptoms prior to surgery to contact our office to arrange for a follow-up visit, and she verbalized understanding.   Per office protocol, patient can hold Eliquis  for 3 days prior to procedure. "  Pt's last dose of Eliquis  will be 06/09/23.   VS: BP (!) 139/57 Comment: right arm sitting  Pulse 60   Temp 36.8 C (Oral)   Resp 16   Ht 5\' 8"  (1.727 m)   Wt 88.5 kg   SpO2 98%   BMI 29.65 kg/m   PROVIDERS: Jearldine Mina, MD is PCP   Cardiologist - Alyssa Backbone, MD  LABS: Labs reviewed: Acceptable for surgery. (all labs ordered are listed, but only abnormal results are displayed)  Labs Reviewed  BASIC METABOLIC PANEL WITH GFR - Abnormal; Notable for the following components:      Result Value   Glucose, Bld 134 (*)    All other  components within normal limits  GLUCOSE, CAPILLARY - Abnormal; Notable for the following components:   Glucose-Capillary 139 (*)    All other components within normal limits  SURGICAL PCR SCREEN  CBC     IMAGES:   EKG:   CV: Echo 04/14/2023 1. Left ventricular ejection fraction, by estimation, is 65 to 70%. The  left ventricle has normal function. The left ventricle has no regional  wall motion abnormalities. There is mild concentric left ventricular  hypertrophy. Left ventricular diastolic  parameters are indeterminate.   2. Right ventricular systolic function is normal. The right ventricular  size is normal.   3. Left atrial size was severely dilated.   4. Right atrial size was moderately dilated.   5. The mitral valve is abnormal. Mild mitral valve regurgitation. No  evidence of mitral stenosis. Moderate to severe mitral annular  calcification.   6. Tricuspid valve regurgitation is moderate.   7. The mean gradinent through the TAVR valve has increased from on  previous echo to 26.76mmHG. Visually leaflets are mildly thicked but open  reasonably well. Suspect elevated gradient likely related to high flow.  (VTI > 70). The aortic valve has  been repaired/replaced. Aortic valve regurgitation is not visualized. No  aortic stenosis is present. There is a 23 mm Edwards Sapien prosthetic  (TAVR) valve present in the aortic position. Procedure Date: 06/30/2020.  Echo findings are consistent with  normal structure  and function of the aortic valve prosthesis.   8. The inferior vena cava is dilated in size with <50% respiratory  variability, suggesting right atrial pressure of 15 mmHg.  Past Medical History:  Diagnosis Date   Anemia    Arthritis 07/20/2012   hands   Asthma    Back pain    Carotid arterial disease (HCC)    CHF (congestive heart failure) (HCC)    Chronic kidney disease    CKD 3a   Class 1 obesity with serious comorbidity and body mass index (BMI) of  31.0 to 31.9 in adult 03/26/2018   Coronary artery disease    Depression    Dysrhythmia    S. 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 Abel Hoe & Dr. Alva Jewels   Severe aortic stenosis    Type 2 diabetes mellitus without complication, without long-term current use of insulin  (HCC) 04/10/2018   Vitamin D  deficiency     Past Surgical History:  Procedure Laterality Date   AV NODE ABLATION N/A 07/14/2021   Procedure: AV NODE ABLATION;  Surgeon: Lei Pump, MD;  Location: [facility];  Service: Cardiovascular;  Laterality: N/A;   BUNIONECTOMY  20 yrs ago   bil feet   BUNIONECTOMY  11/30/2011   Procedure: BUNIONECTOMY;  Surgeon: Verlinda Gloss, MD;  Location: [facility];  Service: Orthopedics;  Laterality: Bilateral;  RIGHT FOOT EXCISION OF BUNIONETTE AND PARTIAL   PROXIMAL PHALANGECTOMY OF 5TH TOE LEFT FOOT FUNK BUNIONECTOMY,EXCISION OF BUNIONETTE    CARDIOVERSION N/A 11/05/2019   Procedure: CARDIOVERSION;  Surgeon: Lenise Quince, MD;  Location: [facility] ENDOSCOPY;  Service: Cardiovascular;  Laterality: N/A;   CARDIOVERSION N/A 12/05/2019   Procedure: CARDIOVERSION;  Surgeon: Euell Herrlich, MD;  Location: [facility] ENDOSCOPY;  Service: Cardiovascular;  Laterality: N/A;   COLONOSCOPY WITH PROPOFOL  N/A 08/07/2012   Procedure: COLONOSCOPY WITH PROPOFOL ;  Surgeon: Garrett Kallman, MD;  Location: [facility]OS[facility];  Service: Endoscopy;  Laterality: N/A;   MOUTH SURGERY     teeth extractions   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   Procedure: PACEMAKER IMPLANT;  Surgeon: Lei Pump, MD;  Location: [facility];  Service: Cardiovascular;  Laterality: N/A;   RIGHT/LEFT HEART CATH AND CORONARY ANGIOGRAPHY N/A 06/17/2020   Procedure: RIGHT/LEFT HEART CATH AND CORONARY ANGIOGRAPHY;  Surgeon: Arty Binning, MD;  Location: [facility];  Service: Cardiovascular;  Laterality: N/A;   TONSILLECTOMY     TRANSCATHETER AORTIC VALVE REPLACEMENT, TRANSFEMORAL N/A 06/30/2020   Procedure: TRANSCATHETER AORTIC VALVE REPLACEMENT, TRANSFEMORAL;  Surgeon: Odie Benne, MD;  Location: [facility];  Service: Open Heart Surgery;  Laterality: N/A;   TUBAL LIGATION     WRIST SURGERY      MEDICATIONS:  acetaminophen  (TYLENOL ) 500 MG tablet   albuterol  (VENTOLIN  HFA) 108 (90 Base) MCG/ACT inhaler   amLODipine  (NORVASC ) 10 MG tablet   apixaban  (ELIQUIS ) 5 MG TABS tablet   budesonide -formoterol  (SYMBICORT ) 160-4.5 MCG/ACT inhaler   Continuous Glucose Sensor (DEXCOM G7 SENSOR) MISC  glimepiride (AMARYL) 2 MG tablet   JARDIANCE 10 MG TABS tablet   meclizine  (ANTIVERT ) 25 MG tablet   memantine  (NAMENDA ) 5 MG tablet   metFORMIN  (GLUCOPHAGE ) 500 MG tablet   Multiple Vitamins-Minerals (CENTRUM SILVER 50+WOMEN) TABS   olmesartan  (BENICAR ) 20 MG tablet   rOPINIRole  (REQUIP ) 4 MG tablet   rosuvastatin  (CRESTOR ) 10 MG tablet   traMADol  (ULTRAM ) 50 MG tablet   No current facility-administered medications for this encounter.    Chick Cotton Ward, PA-C [facility]Surgical Testing [phone]

## 2023-06-02 NOTE — Anesthesia Preprocedure Evaluation (Addendum)
 Anesthesia Evaluation  Patient identified by MRN, date of birth, ID band Patient awake    Reviewed: Allergy & Precautions, NPO status , Patient's Chart, lab work & pertinent test results  History of Anesthesia Complications Negative for: history of anesthetic complications  Airway Mallampati: I  TM Distance: >3 FB Neck ROM: Full    Dental  (+) Upper Dentures, Lower Dentures, Dental Advisory Given   Pulmonary neg shortness of breath, asthma (last used inhaler a week ago) , neg sleep apnea, COPD, neg recent URI   Pulmonary exam normal breath sounds clear to auscultation       Cardiovascular hypertension (amlodipine , olmesartan ), Pt. on medications (-) angina + CAD and +CHF  (-) Past MI, (-) Cardiac Stents and (-) CABG + dysrhythmias (s/p ablation, LBBB) Atrial Fibrillation + pacemaker (St. Jude, dependent) + Valvular Problems/Murmurs (s/p TAVR 06/30/2020) AS  Rhythm:Regular Rate:Normal  She was previously seen in the ED on 04/13/2023 with complaint of chest pain and elevated troponin which were found to be in the setting of demand ischemia  HLD, carotid arterial disease  TTE 04/24/2023: IMPRESSIONS    1. Left ventricular ejection fraction, by estimation, is 65 to 70%. The  left ventricle has normal function. The left ventricle has no regional  wall motion abnormalities. There is mild concentric left ventricular  hypertrophy. Left ventricular diastolic  parameters are indeterminate.   2. Right ventricular systolic function is normal. The right ventricular  size is normal.   3. Left atrial size was severely dilated.   4. Right atrial size was moderately dilated.   5. The mitral valve is abnormal. Mild mitral valve regurgitation. No  evidence of mitral stenosis. Moderate to severe mitral annular  calcification.   6. Tricuspid valve regurgitation is moderate.   7. The mean gradinent through the TAVR valve has increased from on   previous echo to 26.35mmHG. Visually leaflets are mildly thicked but open  reasonably well. Suspect elevated gradient likely related to high flow.  (VTI > 70). The aortic valve has  been repaired/replaced. Aortic valve regurgitation is not visualized. No  aortic stenosis is present. There is a 23 mm Edwards Sapien prosthetic  (TAVR) valve present in the aortic position. Procedure Date: 06/30/2020.  Echo findings are consistent with  normal structure and function of the aortic valve prosthesis.   8. The inferior vena cava is dilated in size with <50% respiratory  variability, suggesting right atrial pressure of 15 mmHg.     Neuro/Psych  PSYCHIATRIC DISORDERS  Depression   Dementia negative neurological ROS     GI/Hepatic Neg liver ROS,GERD  ,,  Endo/Other  diabetes, Type 2, Oral Hypoglycemic Agents    Renal/GU CRFRenal disease (stones)     Musculoskeletal  (+) Arthritis , Osteoarthritis and Rheumatoid disorders,    Abdominal   Peds  Hematology negative hematology ROS (+) Lab Results      Component                Value               Date                      WBC                      6.6                 06/01/2023  HGB                      12.8                06/01/2023                HCT                      39.5                06/01/2023                MCV                      98.5                06/01/2023                PLT                      220                 06/01/2023              Anesthesia Other Findings Last Eliquis : 06/09/2023  Electrophysiologist's Recommendations:    Have magnet available.  Provide continuous ECG monitoring when magnet is used or reprogramming is to be performed.   Procedure should not interfere with device function.  No device programming or magnet placement needed.    Reproductive/Obstetrics                             Anesthesia Physical Anesthesia Plan  ASA: 3  Anesthesia Plan: MAC and  Spinal   Post-op Pain Management: Regional block* and Tylenol  PO (pre-op)*   Induction: Intravenous  PONV Risk Score and Plan: 3 and Ondansetron , Dexamethasone , Propofol  infusion, TIVA and Treatment may vary due to age or medical condition  Airway Management Planned: Natural Airway and Simple Face Mask  Additional Equipment:   Intra-op Plan:   Post-operative Plan:   Informed Consent: I have reviewed the patients History and Physical, chart, labs and discussed the procedure including the risks, benefits and alternatives for the proposed anesthesia with the patient or authorized representative who has indicated his/her understanding and acceptance.     Dental advisory given  Plan Discussed with: CRNA and Anesthesiologist  Anesthesia Plan Comments: (See PAT note 06/01/23  Discussed potential risks of nerve blocks including, but not limited to, infection, bleeding, nerve damage, seizures, pneumothorax, respiratory depression, and potential failure of the block. Alternatives to nerve blocks discussed. All questions answered.  I have discussed risks of neuraxial anesthesia including but not limited to infection, bleeding, nerve injury, back pain, headache, seizures, and failure of block. Patient denies bleeding disorders and is not currently anticoagulated. Labs have been reviewed. Risks and benefits discussed. All patient's questions answered.   Discussed with patient risks of MAC including, but not limited to, minor pain or discomfort, hearing people in the room, and possible need for backup general anesthesia. Risks for general anesthesia also discussed including, but not limited to, sore throat, hoarse voice, chipped/damaged teeth, injury to vocal cords, nausea and vomiting, allergic reactions, lung infection, heart attack, stroke, and death. All questions answered. )       Anesthesia Quick Evaluation

## 2023-06-12 NOTE — H&P (Signed)
 TOTAL KNEE ADMISSION H&P  Patient is being admitted for right total knee arthroplasty.  Therapy Plans: outpatient therapy at [facility] Disposition: Home with daughter (lives together) and grandson Planned DVT Prophylaxis: Eliquis  5 mg BID DME needed: walker PCP: Dr. Hussain - clearance received Cardio: Dr. Lawana Pray - clearance received TXA: IV Allergies: loratidine - itching, losartan - itching Anesthesia Concerns: none BMI: 31.6 Last HgbA1c: 6.9%   Other: - no hx of VTE or cancer - a fib - eliquis  - tramadol /oxycodone , robaxin, tylenol  (tells me tramadol  works best for her)   Subjective:  Chief Complaint:right knee pain.  HPI: Briana Foster, 78 y.o. female, has a history of pain and functional disability in the right knee due to arthritis and has failed non-surgical conservative treatments for greater than 12 weeks to includeNSAID's and/or analgesics and corticosteriod injections.  Onset of symptoms was gradual, starting 2 years ago with gradually worsening course since that time. The patient noted no past surgery on the right knee(s).  Patient currently rates pain in the right knee(s) at 8 out of 10 with activity. Patient has worsening of pain with activity and weight bearing and pain that interferes with activities of daily living.  Patient has evidence of joint space narrowing by imaging studies.  There is no active infection.  Patient Active Problem List   Diagnosis Date Noted   Elevated troponin 04/13/2023   CAP (community acquired pneumonia) 05/22/2022   Asthma, chronic obstructive, with acute exacerbation (HCC) 05/22/2022   Pneumonia 05/22/2022   Leukocytosis 02/01/2022   Transient hypotension 02/01/2022   AKI (acute kidney injury) (HCC) 02/01/2022   Chronic anticoagulation 02/01/2022   Weakness 02/01/2022   Left nephrolithiasis 02/01/2022   History of COVID-19 02/01/2022   COVID-19 virus infection 01/26/2022   Chronic diastolic CHF (congestive heart failure) (HCC)  01/26/2022   Acute pyelonephritis 11/07/2021   UTI (urinary tract infection) 11/07/2021   Hyperlipidemia 11/05/2021   Abdominal pain, right lower quadrant 11/05/2021   Abdominal pain 11/04/2021   Near syncope 10/27/2020   Atrial fibrillation with slow ventricular response (HCC) 10/27/2020   LBBB (left bundle branch block) 10/27/2020   Carotid arterial disease (HCC)    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   Rheumatoid arthritis (HCC)    Restless leg syndrome    Aortic stenosis, severe 01/02/2020   Persistent atrial fibrillation (HCC)    Vitamin D  deficiency 04/10/2018   Uncontrolled type 2 diabetes mellitus with hyperglycemia, without long-term current use of insulin  (HCC) 04/10/2018   Class 1 obesity with serious comorbidity and body mass index (BMI) of 31.0 to 31.9 in adult 03/26/2018   Essential hypertension 03/26/2018   Past Medical History:  Diagnosis Date   Anemia    Arthritis 07/20/2012   hands   Asthma    Back pain    Carotid arterial disease (HCC)    CHF (congestive heart failure) (HCC)    Chronic kidney disease    CKD 3a   Class 1 obesity with serious comorbidity and body mass index (BMI) of 31.0 to 31.9 in adult 03/26/2018   Coronary artery disease    Depression    Dysrhythmia    S. 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 Abel Hoe & Dr. Alva Jewels   Severe aortic stenosis    Type 2 diabetes mellitus without complication, without long-term current use of insulin  (HCC) 04/10/2018   Vitamin D  deficiency     Past Surgical History:  Procedure Laterality Date   AV NODE  ABLATION N/A 07/14/2021   Procedure: AV NODE ABLATION;  Surgeon: Lei Pump, MD;  Location: [facility];  Service: Cardiovascular;  Laterality: N/A;   BUNIONECTOMY  20 yrs ago   bil feet   BUNIONECTOMY  11/30/2011   Procedure: BUNIONECTOMY;  Surgeon: Verlinda Gloss, MD;  Location: [facility];  Service: Orthopedics;  Laterality: Bilateral;  RIGHT FOOT EXCISION OF BUNIONETTE AND PARTIAL   PROXIMAL PHALANGECTOMY OF 5TH TOE LEFT FOOT FUNK BUNIONECTOMY,EXCISION OF BUNIONETTE    CARDIOVERSION N/A 11/05/2019   Procedure: CARDIOVERSION;  Surgeon: Lenise Quince, MD;  Location: [facility];  Service: Cardiovascular;  Laterality: N/A;   CARDIOVERSION N/A 12/05/2019   Procedure: CARDIOVERSION;  Surgeon: Euell Herrlich, MD;  Location: [facility] ENDOSCOPY;  Service: Cardiovascular;  Laterality: N/A;   COLONOSCOPY WITH PROPOFOL  N/A 08/07/2012   Procedure: COLONOSCOPY WITH PROPOFOL ;  Surgeon: Garrett Kallman, MD;  Location: [facility]OSCOPY;  Service: Endoscopy;  Laterality: N/A;   MOUTH SURGERY     teeth extractions   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   Procedure: PACEMAKER IMPLANT;  Surgeon: Lei Pump, MD;  Location: [facility];  Service: Cardiovascular;  Laterality: N/A;   RIGHT/LEFT HEART CATH AND CORONARY ANGIOGRAPHY N/A 06/17/2020   Procedure: RIGHT/LEFT HEART CATH AND CORONARY ANGIOGRAPHY;  Surgeon: Arty Binning, MD;  Location: [facility];  Service: Cardiovascular;  Laterality: N/A;   TONSILLECTOMY     TRANSCATHETER AORTIC VALVE REPLACEMENT, TRANSFEMORAL N/A 06/30/2020   Procedure: TRANSCATHETER AORTIC VALVE REPLACEMENT, TRANSFEMORAL;  Surgeon: Odie Benne, MD;  Location: [facility];  Service: Open Heart Surgery;  Laterality: N/A;   TUBAL LIGATION     WRIST SURGERY      No current facility-administered medications for this encounter.   Current Outpatient Medications  Medication Sig Dispense Refill Last Dose/Taking   acetaminophen  (TYLENOL )  500 MG tablet Take 500-1,000 mg by mouth every 8 (eight) hours as needed (for pain or headaches).   Taking As Needed   albuterol  (VENTOLIN  HFA) 108 (90 Base) MCG/ACT inhaler Inhale 2 puffs into the lungs every 4 (four) hours as needed for wheezing or shortness of breath. 18 g 1 Taking As Needed   amLODipine  (NORVASC ) 10 MG tablet Take 10 mg by mouth daily.   Taking   apixaban  (ELIQUIS ) 5 MG TABS tablet Take 1 tablet (5 mg total) by mouth 2 (two) times daily. 60 tablet 5 Taking   budesonide -formoterol  (SYMBICORT ) 160-4.5 MCG/ACT inhaler Inhale 2 puffs into the lungs 2 (two) times daily. (Patient taking differently: Inhale 2 puffs into the lungs 2 (two) times daily as needed (asthma).) 10.2 g 1 Taking Differently   glimepiride (AMARYL) 2 MG tablet Take 2 mg by mouth daily with breakfast.   Taking   JARDIANCE 10 MG TABS tablet Take 10 mg by mouth daily.   Taking   meclizine  (ANTIVERT ) 25 MG tablet Take 25 mg by mouth 2 (two) times daily as needed for dizziness.   Taking As Needed   memantine  (NAMENDA ) 5 MG tablet Take 5  mg by mouth in the morning.   Taking   metFORMIN  (GLUCOPHAGE ) 500 MG tablet Take 500 mg by mouth daily with breakfast.   Taking   Multiple Vitamins-Minerals (CENTRUM SILVER 50+WOMEN) TABS Take 1 tablet by mouth daily with breakfast.   Taking   olmesartan  (BENICAR ) 20 MG tablet Take 20 mg by mouth daily.   Taking   rOPINIRole  (REQUIP ) 4 MG tablet Take 4 mg by mouth at bedtime. Take 4 mg by mouth at 8 PM   Taking   rosuvastatin  (CRESTOR ) 10 MG tablet Take 1 tablet (10 mg total) by mouth daily. 90 tablet 3 Taking   traMADol  (ULTRAM ) 50 MG tablet Take 50-100 mg by mouth every 6 (six) hours as needed (for pain).   Taking As Needed   Continuous Glucose Sensor (DEXCOM G7 SENSOR) MISC Inject 1 Device into the skin See admin instructions. Place 1 new sensor into the skin every 10 days (Patient not taking: Reported on 05/15/2023)      Allergies  Allergen Reactions   Bee Venom Shortness Of Breath  and Rash   Claritin  [Loratadine ] Itching and Palpitations   Wasp Venom Anaphylaxis   Apricot Flavoring Agent (Non-Screening) Swelling and Other (See Comments)    Eyes swell shut   Atorvastatin Itching, Swelling and Other (See Comments)    Eye swelling and headaches   Losartan Itching and Rash    Social History   Tobacco Use   Smoking status: Never   Smokeless tobacco: Never  Substance Use Topics   Alcohol use: No    Family History  Problem Relation Age of Onset   Heart disease Mother    Stroke Mother    Cancer Father        Prostate     Review of Systems  Constitutional:  Negative for chills and fever.  Respiratory:  Negative for cough and shortness of breath.   Cardiovascular:  Negative for chest pain.  Gastrointestinal:  Negative for nausea and vomiting.  Musculoskeletal:  Positive for arthralgias.     Objective:  Physical Exam Well nourished and well developed. General: Alert and oriented x3, cooperative and pleasant, no acute distress.  Musculoskeletal: Right knee exam: No palpable effusion, warmth erythema Slight flexion contracture with flexion to 110 degrees with tightness over the anterior aspect of the knee with crepitation Tenderness laterally more so than medially No significant lower extremity edema, erythema or calf tenderness  Vital signs in last 24 hours:    Labs:   Estimated body mass index is 29.65 kg/m as calculated from the following:   Height as of 06/01/23: 5\' 8"  (1.727 m).   Weight as of 06/01/23: 88.5 kg.   Imaging Review Plain radiographs demonstrate severe degenerative joint disease of the right knee(s). The bone quality appears to be adequate for age and reported activity level.      Assessment/Plan:  End stage arthritis, right knee   The patient history, physical examination, clinical judgment of the provider and imaging studies are consistent with end stage degenerative joint disease of the right knee(s) and total knee  arthroplasty is deemed medically necessary. The treatment options including medical management, injection therapy arthroscopy and arthroplasty were discussed at length. The risks and benefits of total knee arthroplasty were presented and reviewed. The risks due to aseptic loosening, infection, stiffness, patella tracking problems, thromboembolic complications and other imponderables were discussed. The patient acknowledged the explanation, agreed to proceed with the plan and consent was signed. Patient is being admitted for inpatient treatment for  surgery, pain control, PT, OT, prophylactic antibiotics, VTE prophylaxis, progressive ambulation and ADL's and discharge planning. The patient is planning to be discharged home.     Patient's anticipated LOS is less than 2 midnights, meeting these requirements: - Younger than 5 - Lives within 1 hour of care - Has a competent adult at home to recover with post-op recover - NO history of  - Chronic pain requiring opiods  - Diabetes  - Coronary Artery Disease  - Heart failure  - Heart attack  - Stroke  - DVT/VTE  - Cardiac arrhythmia  - Respiratory Failure/COPD  - Renal failure  - Anemia  - Advanced Liver disease  Kim Pen, PA-C Orthopedic Surgery [facility] [place] [phone]

## 2023-06-13 ENCOUNTER — Encounter (HOSPITAL_COMMUNITY)
Admission: RE | Disposition: A | Discharge status: Home Health | Payer: Self-pay | Source: Home / Self Care | Attending: Orthopedic Surgery

## 2023-06-13 ENCOUNTER — Encounter (HOSPITAL_COMMUNITY): Payer: Self-pay | Admitting: Orthopedic Surgery

## 2023-06-13 ENCOUNTER — Other Ambulatory Visit: Payer: Self-pay

## 2023-06-13 ENCOUNTER — Ambulatory Visit (HOSPITAL_COMMUNITY): Payer: Self-pay | Admitting: Physician Assistant

## 2023-06-13 ENCOUNTER — Ambulatory Visit (HOSPITAL_COMMUNITY): Payer: Self-pay | Admitting: Anesthesiology

## 2023-06-13 ENCOUNTER — Inpatient Hospital Stay (HOSPITAL_COMMUNITY)
Admission: RE | Admit: 2023-06-13 | Discharge: 2023-06-21 | DRG: 470 | Disposition: A | Discharge status: Home Health | Attending: Orthopedic Surgery | Admitting: Orthopedic Surgery

## 2023-06-13 DIAGNOSIS — I4819 Other persistent atrial fibrillation: Secondary | ICD-10-CM | POA: Diagnosis present

## 2023-06-13 DIAGNOSIS — N1831 Chronic kidney disease, stage 3a: Secondary | ICD-10-CM | POA: Diagnosis present

## 2023-06-13 DIAGNOSIS — G2581 Restless legs syndrome: Secondary | ICD-10-CM | POA: Diagnosis present

## 2023-06-13 DIAGNOSIS — Z950 Presence of cardiac pacemaker: Secondary | ICD-10-CM

## 2023-06-13 DIAGNOSIS — I4891 Unspecified atrial fibrillation: Secondary | ICD-10-CM | POA: Diagnosis not present

## 2023-06-13 DIAGNOSIS — J4489 Other specified chronic obstructive pulmonary disease: Secondary | ICD-10-CM | POA: Diagnosis present

## 2023-06-13 DIAGNOSIS — J449 Chronic obstructive pulmonary disease, unspecified: Secondary | ICD-10-CM | POA: Diagnosis not present

## 2023-06-13 DIAGNOSIS — M1711 Unilateral primary osteoarthritis, right knee: Secondary | ICD-10-CM

## 2023-06-13 DIAGNOSIS — E785 Hyperlipidemia, unspecified: Secondary | ICD-10-CM | POA: Diagnosis present

## 2023-06-13 DIAGNOSIS — Z79899 Other long term (current) drug therapy: Secondary | ICD-10-CM

## 2023-06-13 DIAGNOSIS — Z7984 Long term (current) use of oral hypoglycemic drugs: Secondary | ICD-10-CM | POA: Diagnosis not present

## 2023-06-13 DIAGNOSIS — Z6831 Body mass index (BMI) 31.0-31.9, adult: Secondary | ICD-10-CM | POA: Diagnosis not present

## 2023-06-13 DIAGNOSIS — M65961 Unspecified synovitis and tenosynovitis, right lower leg: Secondary | ICD-10-CM | POA: Diagnosis present

## 2023-06-13 DIAGNOSIS — Z8042 Family history of malignant neoplasm of prostate: Secondary | ICD-10-CM

## 2023-06-13 DIAGNOSIS — M069 Rheumatoid arthritis, unspecified: Secondary | ICD-10-CM | POA: Diagnosis present

## 2023-06-13 DIAGNOSIS — Z952 Presence of prosthetic heart valve: Secondary | ICD-10-CM

## 2023-06-13 DIAGNOSIS — G8918 Other acute postprocedural pain: Secondary | ICD-10-CM | POA: Diagnosis not present

## 2023-06-13 DIAGNOSIS — Z7951 Long term (current) use of inhaled steroids: Secondary | ICD-10-CM | POA: Diagnosis not present

## 2023-06-13 DIAGNOSIS — I5032 Chronic diastolic (congestive) heart failure: Secondary | ICD-10-CM | POA: Diagnosis present

## 2023-06-13 DIAGNOSIS — Z8616 Personal history of COVID-19: Secondary | ICD-10-CM | POA: Diagnosis not present

## 2023-06-13 DIAGNOSIS — Z8249 Family history of ischemic heart disease and other diseases of the circulatory system: Secondary | ICD-10-CM | POA: Diagnosis not present

## 2023-06-13 DIAGNOSIS — E559 Vitamin D deficiency, unspecified: Secondary | ICD-10-CM | POA: Diagnosis present

## 2023-06-13 DIAGNOSIS — I447 Left bundle-branch block, unspecified: Secondary | ICD-10-CM | POA: Diagnosis present

## 2023-06-13 DIAGNOSIS — I130 Hypertensive heart and chronic kidney disease with heart failure and stage 1 through stage 4 chronic kidney disease, or unspecified chronic kidney disease: Secondary | ICD-10-CM | POA: Diagnosis present

## 2023-06-13 DIAGNOSIS — Z96651 Presence of right artificial knee joint: Principal | ICD-10-CM

## 2023-06-13 DIAGNOSIS — I2510 Atherosclerotic heart disease of native coronary artery without angina pectoris: Secondary | ICD-10-CM | POA: Diagnosis present

## 2023-06-13 DIAGNOSIS — I110 Hypertensive heart disease with heart failure: Secondary | ICD-10-CM | POA: Diagnosis not present

## 2023-06-13 DIAGNOSIS — E1122 Type 2 diabetes mellitus with diabetic chronic kidney disease: Secondary | ICD-10-CM | POA: Diagnosis present

## 2023-06-13 DIAGNOSIS — Z888 Allergy status to other drugs, medicaments and biological substances status: Secondary | ICD-10-CM

## 2023-06-13 DIAGNOSIS — E119 Type 2 diabetes mellitus without complications: Secondary | ICD-10-CM

## 2023-06-13 DIAGNOSIS — Z87442 Personal history of urinary calculi: Secondary | ICD-10-CM

## 2023-06-13 DIAGNOSIS — Z01818 Encounter for other preprocedural examination: Secondary | ICD-10-CM

## 2023-06-13 DIAGNOSIS — Z7901 Long term (current) use of anticoagulants: Secondary | ICD-10-CM

## 2023-06-13 DIAGNOSIS — I509 Heart failure, unspecified: Secondary | ICD-10-CM | POA: Diagnosis not present

## 2023-06-13 DIAGNOSIS — Z823 Family history of stroke: Secondary | ICD-10-CM

## 2023-06-13 HISTORY — DX: Presence of cardiac pacemaker: Z95.0

## 2023-06-13 HISTORY — PX: TOTAL KNEE ARTHROPLASTY: SHX125

## 2023-06-13 LAB — GLUCOSE, CAPILLARY
Glucose-Capillary: 163 mg/dL — ABNORMAL HIGH (ref 70–99)
Glucose-Capillary: 163 mg/dL — ABNORMAL HIGH (ref 70–99)
Glucose-Capillary: 147 mg/dL — ABNORMAL HIGH (ref 70–99)
Glucose-Capillary: 278 mg/dL — ABNORMAL HIGH (ref 70–99)

## 2023-06-13 SURGERY — ARTHROPLASTY, KNEE, TOTAL
Anesthesia: Monitor Anesthesia Care | Site: Knee | Laterality: Right

## 2023-06-13 MED ORDER — POVIDONE-IODINE 10 % EX SWAB
2.0000 | Freq: Once | CUTANEOUS | Status: AC
  Administered 2023-06-13: 2 via TOPICAL

## 2023-06-13 MED ORDER — ONDANSETRON HCL 4 MG/2ML IJ SOLN
4.0000 mg | Freq: Four times a day (QID) | INTRAMUSCULAR | Status: DC | PRN
  Administered 2023-06-19: 4 mg via INTRAVENOUS
  Filled 2023-06-13 (×3): qty 2

## 2023-06-13 MED ORDER — ONDANSETRON HCL 4 MG/2ML IJ SOLN
INTRAMUSCULAR | Status: AC
  Filled 2023-06-13: qty 2

## 2023-06-13 MED ORDER — METHOCARBAMOL 500 MG PO TABS
500.0000 mg | ORAL_TABLET | Freq: Four times a day (QID) | ORAL | Status: DC | PRN
  Administered 2023-06-14 – 2023-06-21 (×14): 500 mg via ORAL
  Filled 2023-06-13 (×15): qty 1

## 2023-06-13 MED ORDER — FENTANYL CITRATE PF 50 MCG/ML IJ SOSY: 25.0000 ug | PREFILLED_SYRINGE | INTRAMUSCULAR | Status: DC | PRN

## 2023-06-13 MED ORDER — DEXAMETHASONE SODIUM PHOSPHATE 10 MG/ML IJ SOLN
8.0000 mg | Freq: Once | INTRAMUSCULAR | Status: AC
  Administered 2023-06-13: 10 mg via INTRAVENOUS

## 2023-06-13 MED ORDER — MIDAZOLAM HCL 2 MG/2ML IJ SOLN: 1.0000 mg | Freq: Once | INTRAMUSCULAR | Status: DC

## 2023-06-13 MED ORDER — OXYCODONE HCL 5 MG/5ML PO SOLN: 5.0000 mg | Freq: Once | ORAL | Status: DC | PRN

## 2023-06-13 MED ORDER — KETOROLAC TROMETHAMINE 30 MG/ML IJ SOLN
INTRAMUSCULAR | Status: AC
  Filled 2023-06-13: qty 1

## 2023-06-13 MED ORDER — PHENYLEPHRINE HCL-NACL 20-0.9 MG/250ML-% IV SOLN
INTRAVENOUS | Status: DC | PRN
  Administered 2023-06-13: 25 ug/min via INTRAVENOUS

## 2023-06-13 MED ORDER — DEXAMETHASONE SODIUM PHOSPHATE 10 MG/ML IJ SOLN
10.0000 mg | Freq: Once | INTRAMUSCULAR | Status: AC
  Administered 2023-06-14: 10 mg via INTRAVENOUS
  Filled 2023-06-13: qty 1

## 2023-06-13 MED ORDER — PHENOL 1.4 % MT LIQD: 1.0000 | OROMUCOSAL | Status: DC | PRN

## 2023-06-13 MED ORDER — FLUTICASONE FUROATE-VILANTEROL 200-25 MCG/ACT IN AEPB
1.0000 | INHALATION_SPRAY | Freq: Every day | RESPIRATORY_TRACT | Status: DC
  Administered 2023-06-14 – 2023-06-21 (×8): 1 via RESPIRATORY_TRACT
  Filled 2023-06-13: qty 28

## 2023-06-13 MED ORDER — APIXABAN 5 MG PO TABS
5.0000 mg | ORAL_TABLET | Freq: Two times a day (BID) | ORAL | Status: DC
  Administered 2023-06-14 – 2023-06-21 (×15): 5 mg via ORAL
  Filled 2023-06-13 (×15): qty 1

## 2023-06-13 MED ORDER — ALUM & MAG HYDROXIDE-SIMETH 200-200-20 MG/5ML PO SUSP
30.0000 mL | ORAL | Status: DC | PRN
  Administered 2023-06-15: 30 mL via ORAL
  Filled 2023-06-13: qty 30

## 2023-06-13 MED ORDER — IRBESARTAN 150 MG PO TABS
150.0000 mg | ORAL_TABLET | Freq: Every day | ORAL | Status: DC
  Administered 2023-06-13 – 2023-06-21 (×9): 150 mg via ORAL
  Filled 2023-06-13 (×9): qty 1

## 2023-06-13 MED ORDER — PROPOFOL 1000 MG/100ML IV EMUL
INTRAVENOUS | Status: AC
  Filled 2023-06-13: qty 100

## 2023-06-13 MED ORDER — DEXAMETHASONE SODIUM PHOSPHATE 10 MG/ML IJ SOLN
INTRAMUSCULAR | Status: AC
  Filled 2023-06-13: qty 1

## 2023-06-13 MED ORDER — STERILE WATER FOR IRRIGATION IR SOLN
Status: DC | PRN
  Administered 2023-06-13: 1000 mL

## 2023-06-13 MED ORDER — SODIUM CHLORIDE 0.9% FLUSH: 3.0000 mL | INTRAVENOUS | Status: DC | PRN

## 2023-06-13 MED ORDER — ORAL CARE MOUTH RINSE: 15.0000 mL | Freq: Once | OROMUCOSAL | Status: AC

## 2023-06-13 MED ORDER — PROPOFOL 1000 MG/100ML IV EMUL
INTRAVENOUS | Status: AC
Start: 2023-06-13 — End: ?
  Filled 2023-06-13: qty 100

## 2023-06-13 MED ORDER — SODIUM CHLORIDE (PF) 0.9 % IJ SOLN
INTRAMUSCULAR | Status: DC | PRN
  Administered 2023-06-13: 61 mL

## 2023-06-13 MED ORDER — TRAMADOL HCL 50 MG PO TABS
50.0000 mg | ORAL_TABLET | Freq: Four times a day (QID) | ORAL | Status: DC | PRN
  Administered 2023-06-13: 50 mg via ORAL
  Administered 2023-06-14 – 2023-06-15 (×2): 100 mg via ORAL
  Filled 2023-06-13: qty 1
  Filled 2023-06-13 (×2): qty 2

## 2023-06-13 MED ORDER — PROPOFOL 10 MG/ML IV BOLUS
INTRAVENOUS | Status: DC | PRN
  Administered 2023-06-13: 20 mg via INTRAVENOUS
  Administered 2023-06-13 (×2): 40 mg via INTRAVENOUS

## 2023-06-13 MED ORDER — CEFAZOLIN SODIUM-DEXTROSE 2-4 GM/100ML-% IV SOLN
2.0000 g | INTRAVENOUS | Status: AC
  Administered 2023-06-13: 2 g via INTRAVENOUS
  Filled 2023-06-13: qty 100

## 2023-06-13 MED ORDER — ACETAMINOPHEN 500 MG PO TABS
1000.0000 mg | ORAL_TABLET | Freq: Four times a day (QID) | ORAL | Status: AC
  Administered 2023-06-13 – 2023-06-17 (×16): 1000 mg via ORAL
  Filled 2023-06-13 (×16): qty 2

## 2023-06-13 MED ORDER — HYDROMORPHONE HCL 1 MG/ML IJ SOLN
0.5000 mg | INTRAMUSCULAR | Status: DC | PRN
  Administered 2023-06-16: 1 mg via INTRAVENOUS
  Filled 2023-06-13 (×2): qty 1

## 2023-06-13 MED ORDER — LIDOCAINE HCL (PF) 2 % IJ SOLN
INTRAMUSCULAR | Status: AC
  Filled 2023-06-13: qty 10

## 2023-06-13 MED ORDER — POLYETHYLENE GLYCOL 3350 17 G PO PACK
17.0000 g | PACK | Freq: Two times a day (BID) | ORAL | Status: DC
  Administered 2023-06-13 – 2023-06-21 (×8): 17 g via ORAL
  Filled 2023-06-13 (×15): qty 1

## 2023-06-13 MED ORDER — AMISULPRIDE (ANTIEMETIC) 5 MG/2ML IV SOLN: 10.0000 mg | Freq: Once | INTRAVENOUS | Status: DC | PRN

## 2023-06-13 MED ORDER — LACTATED RINGERS IV SOLN
INTRAVENOUS | Status: DC
Start: 2023-06-13 — End: 2023-06-13

## 2023-06-13 MED ORDER — PHENYLEPHRINE 80 MCG/ML (10ML) SYRINGE FOR IV PUSH (FOR BLOOD PRESSURE SUPPORT)
PREFILLED_SYRINGE | INTRAVENOUS | Status: AC
  Filled 2023-06-13: qty 20

## 2023-06-13 MED ORDER — BISACODYL 10 MG RE SUPP: 10.0000 mg | Freq: Every day | RECTAL | Status: DC | PRN

## 2023-06-13 MED ORDER — INSULIN ASPART 100 UNIT/ML IJ SOLN
0.0000 [IU] | Freq: Every day | INTRAMUSCULAR | Status: DC
  Administered 2023-06-13: 3 [IU] via SUBCUTANEOUS

## 2023-06-13 MED ORDER — METFORMIN HCL 500 MG PO TABS
500.0000 mg | ORAL_TABLET | Freq: Every day | ORAL | Status: DC
Start: 2023-06-14 — End: 2023-06-21
  Administered 2023-06-14 – 2023-06-21 (×8): 500 mg via ORAL
  Filled 2023-06-13 (×8): qty 1

## 2023-06-13 MED ORDER — ONDANSETRON HCL 4 MG PO TABS: 4.0000 mg | ORAL_TABLET | Freq: Four times a day (QID) | ORAL | Status: DC | PRN

## 2023-06-13 MED ORDER — SENNA 8.6 MG PO TABS
2.0000 | ORAL_TABLET | Freq: Every day | ORAL | Status: DC
  Administered 2023-06-13 – 2023-06-20 (×7): 17.2 mg via ORAL
  Filled 2023-06-13 (×8): qty 2

## 2023-06-13 MED ORDER — AMLODIPINE BESYLATE 10 MG PO TABS
10.0000 mg | ORAL_TABLET | Freq: Every day | ORAL | Status: DC
  Administered 2023-06-14 – 2023-06-21 (×8): 10 mg via ORAL
  Filled 2023-06-13 (×8): qty 1

## 2023-06-13 MED ORDER — BUPIVACAINE IN DEXTROSE 0.75-8.25 % IT SOLN
INTRATHECAL | Status: DC | PRN
  Administered 2023-06-13: 1.6 mL via INTRATHECAL

## 2023-06-13 MED ORDER — OXYCODONE HCL 5 MG PO TABS: 5.0000 mg | ORAL_TABLET | Freq: Once | ORAL | Status: DC | PRN

## 2023-06-13 MED ORDER — CEFAZOLIN SODIUM-DEXTROSE 2-4 GM/100ML-% IV SOLN
2.0000 g | Freq: Four times a day (QID) | INTRAVENOUS | Status: AC
  Administered 2023-06-13 – 2023-06-14 (×2): 2 g via INTRAVENOUS
  Filled 2023-06-13 (×2): qty 100

## 2023-06-13 MED ORDER — DIPHENHYDRAMINE HCL 12.5 MG/5ML PO ELIX
12.5000 mg | ORAL_SOLUTION | ORAL | Status: DC | PRN
  Administered 2023-06-15: 25 mg via ORAL
  Filled 2023-06-13: qty 10

## 2023-06-13 MED ORDER — ALBUTEROL SULFATE (2.5 MG/3ML) 0.083% IN NEBU
3.0000 mL | INHALATION_SOLUTION | RESPIRATORY_TRACT | Status: DC | PRN
  Administered 2023-06-14 (×2): 3 mL via RESPIRATORY_TRACT
  Filled 2023-06-13 (×2): qty 3

## 2023-06-13 MED ORDER — SODIUM CHLORIDE 0.9% FLUSH
3.0000 mL | Freq: Two times a day (BID) | INTRAVENOUS | Status: DC
  Administered 2023-06-14 – 2023-06-16 (×5): 10 mL via INTRAVENOUS
  Administered 2023-06-16 – 2023-06-17 (×2): 5 mL via INTRAVENOUS
  Administered 2023-06-17: 3 mL via INTRAVENOUS
  Administered 2023-06-18: 5 mL via INTRAVENOUS
  Administered 2023-06-18: 10 mL via INTRAVENOUS
  Administered 2023-06-19: 3 mL via INTRAVENOUS
  Administered 2023-06-19 – 2023-06-21 (×4): 10 mL via INTRAVENOUS

## 2023-06-13 MED ORDER — GLIMEPIRIDE 2 MG PO TABS
2.0000 mg | ORAL_TABLET | Freq: Every day | ORAL | Status: DC
Start: 2023-06-14 — End: 2023-06-21
  Administered 2023-06-14 – 2023-06-21 (×8): 2 mg via ORAL
  Filled 2023-06-13 (×8): qty 1

## 2023-06-13 MED ORDER — 0.9 % SODIUM CHLORIDE (POUR BTL) OPTIME
TOPICAL | Status: DC | PRN
  Administered 2023-06-13: 1000 mL

## 2023-06-13 MED ORDER — OXYCODONE HCL 5 MG PO TABS
ORAL_TABLET | ORAL | Status: AC
  Filled 2023-06-13: qty 2

## 2023-06-13 MED ORDER — ONDANSETRON HCL 4 MG/2ML IJ SOLN
INTRAMUSCULAR | Status: DC | PRN
  Administered 2023-06-13: 4 mg via INTRAVENOUS

## 2023-06-13 MED ORDER — ORAL CARE MOUTH RINSE: 15.0000 mL | OROMUCOSAL | Status: DC | PRN

## 2023-06-13 MED ORDER — MEMANTINE HCL 5 MG PO TABS
5.0000 mg | ORAL_TABLET | Freq: Every morning | ORAL | Status: DC
Start: 2023-06-14 — End: 2023-06-21
  Administered 2023-06-14 – 2023-06-21 (×8): 5 mg via ORAL
  Filled 2023-06-13 (×8): qty 1

## 2023-06-13 MED ORDER — SODIUM CHLORIDE 0.9 % IR SOLN
Status: DC | PRN
  Administered 2023-06-13: 1000 mL

## 2023-06-13 MED ORDER — FENTANYL CITRATE PF 50 MCG/ML IJ SOSY
50.0000 ug | PREFILLED_SYRINGE | Freq: Once | INTRAMUSCULAR | Status: AC
  Administered 2023-06-13: 50 ug via INTRAVENOUS
  Filled 2023-06-13: qty 2

## 2023-06-13 MED ORDER — ONDANSETRON HCL 4 MG/2ML IJ SOLN
4.0000 mg | Freq: Once | INTRAMUSCULAR | Status: AC
  Administered 2023-06-13: 4 mg via INTRAVENOUS

## 2023-06-13 MED ORDER — TRANEXAMIC ACID-NACL 1000-0.7 MG/100ML-% IV SOLN
1000.0000 mg | Freq: Once | INTRAVENOUS | Status: AC
  Administered 2023-06-13: 1000 mg via INTRAVENOUS
  Filled 2023-06-13: qty 100

## 2023-06-13 MED ORDER — INSULIN ASPART 100 UNIT/ML IJ SOLN
0.0000 [IU] | Freq: Three times a day (TID) | INTRAMUSCULAR | Status: DC
  Administered 2023-06-14: 5 [IU] via SUBCUTANEOUS
  Administered 2023-06-14: 3 [IU] via SUBCUTANEOUS
  Administered 2023-06-14: 5 [IU] via SUBCUTANEOUS
  Administered 2023-06-15 (×2): 3 [IU] via SUBCUTANEOUS
  Administered 2023-06-15 – 2023-06-16 (×4): 2 [IU] via SUBCUTANEOUS
  Administered 2023-06-20: 3 [IU] via SUBCUTANEOUS
  Administered 2023-06-20 – 2023-06-21 (×4): 2 [IU] via SUBCUTANEOUS

## 2023-06-13 MED ORDER — MENTHOL 3 MG MT LOZG: 1.0000 | LOZENGE | OROMUCOSAL | Status: DC | PRN

## 2023-06-13 MED ORDER — ROSUVASTATIN CALCIUM 10 MG PO TABS
10.0000 mg | ORAL_TABLET | Freq: Every day | ORAL | Status: DC
  Administered 2023-06-14 – 2023-06-21 (×8): 10 mg via ORAL
  Filled 2023-06-13 (×8): qty 1

## 2023-06-13 MED ORDER — ROPIVACAINE HCL 5 MG/ML IJ SOLN
INTRAMUSCULAR | Status: DC | PRN
  Administered 2023-06-13: 20 mL via PERINEURAL

## 2023-06-13 MED ORDER — LIDOCAINE 2% (20 MG/ML) 5 ML SYRINGE
INTRAMUSCULAR | Status: DC | PRN
  Administered 2023-06-13: 40 mg via INTRAVENOUS
  Administered 2023-06-13: 60 mg via INTRAVENOUS

## 2023-06-13 MED ORDER — INSULIN ASPART 100 UNIT/ML IJ SOLN: 0.0000 [IU] | INTRAMUSCULAR | Status: DC | PRN

## 2023-06-13 MED ORDER — ACETAMINOPHEN 325 MG PO TABS
325.0000 mg | ORAL_TABLET | Freq: Four times a day (QID) | ORAL | Status: DC | PRN
  Administered 2023-06-18: 650 mg via ORAL
  Filled 2023-06-13 (×2): qty 2

## 2023-06-13 MED ORDER — METOCLOPRAMIDE HCL 5 MG PO TABS: 5.0000 mg | ORAL_TABLET | Freq: Three times a day (TID) | ORAL | Status: DC | PRN

## 2023-06-13 MED ORDER — ROPINIROLE HCL 1 MG PO TABS
4.0000 mg | ORAL_TABLET | Freq: Every day | ORAL | Status: DC
  Administered 2023-06-13 – 2023-06-20 (×8): 4 mg via ORAL
  Filled 2023-06-13 (×8): qty 4

## 2023-06-13 MED ORDER — CHLORHEXIDINE GLUCONATE 0.12 % MT SOLN
15.0000 mL | Freq: Once | OROMUCOSAL | Status: AC
  Administered 2023-06-13: 15 mL via OROMUCOSAL

## 2023-06-13 MED ORDER — PROPOFOL 500 MG/50ML IV EMUL
INTRAVENOUS | Status: DC | PRN
  Administered 2023-06-13: 75 ug/kg/min via INTRAVENOUS

## 2023-06-13 MED ORDER — METHOCARBAMOL 1000 MG/10ML IJ SOLN: 500.0000 mg | Freq: Four times a day (QID) | INTRAMUSCULAR | Status: DC | PRN

## 2023-06-13 MED ORDER — ACETAMINOPHEN 500 MG PO TABS
1000.0000 mg | ORAL_TABLET | Freq: Once | ORAL | Status: AC
  Administered 2023-06-13: 1000 mg via ORAL
  Filled 2023-06-13: qty 2

## 2023-06-13 MED ORDER — OXYCODONE HCL 5 MG PO TABS
5.0000 mg | ORAL_TABLET | ORAL | Status: DC | PRN
  Administered 2023-06-13: 10 mg via ORAL
  Administered 2023-06-14: 5 mg via ORAL
  Administered 2023-06-14 (×2): 10 mg via ORAL
  Filled 2023-06-13 (×2): qty 2
  Filled 2023-06-13: qty 1

## 2023-06-13 MED ORDER — EMPAGLIFLOZIN 10 MG PO TABS
10.0000 mg | ORAL_TABLET | Freq: Every day | ORAL | Status: DC
  Administered 2023-06-14 – 2023-06-21 (×8): 10 mg via ORAL
  Filled 2023-06-13 (×8): qty 1

## 2023-06-13 MED ORDER — TRANEXAMIC ACID-NACL 1000-0.7 MG/100ML-% IV SOLN
1000.0000 mg | INTRAVENOUS | Status: AC
  Administered 2023-06-13: 1000 mg via INTRAVENOUS
  Filled 2023-06-13: qty 100

## 2023-06-13 MED ORDER — BUPIVACAINE-EPINEPHRINE (PF) 0.25% -1:200000 IJ SOLN
INTRAMUSCULAR | Status: AC
  Filled 2023-06-13: qty 30

## 2023-06-13 MED ORDER — MECLIZINE HCL 25 MG PO TABS: 25.0000 mg | ORAL_TABLET | Freq: Two times a day (BID) | ORAL | Status: DC | PRN

## 2023-06-13 MED ORDER — METOCLOPRAMIDE HCL 5 MG/ML IJ SOLN: 5.0000 mg | Freq: Three times a day (TID) | INTRAMUSCULAR | Status: DC | PRN

## 2023-06-13 MED ORDER — SODIUM CHLORIDE (PF) 0.9 % IJ SOLN
INTRAMUSCULAR | Status: AC
  Filled 2023-06-13: qty 30

## 2023-06-13 SURGICAL SUPPLY — 45 items
ATTUNE MED ANAT PAT 38 KNEE (Knees) IMPLANT
BAG COUNTER SPONGE SURGICOUNT (BAG) IMPLANT
BAG ZIPLOCK 12X15 (MISCELLANEOUS) ×1 IMPLANT
BASEPLATE TIB CMT FB PCKT SZ5 (Knees) IMPLANT
BLADE SAW SGTL 13.0X1.19X90.0M (BLADE) ×1 IMPLANT
BNDG ELASTIC 6INX 5YD STR LF (GAUZE/BANDAGES/DRESSINGS) ×1 IMPLANT
BOWL SMART MIX CTS (DISPOSABLE) ×1 IMPLANT
CEMENT HV SMART SET (Cement) ×2 IMPLANT
COMPONENT FEM CMT ATTN KN 5 RT (Joint) IMPLANT
COVER SURGICAL LIGHT HANDLE (MISCELLANEOUS) ×1 IMPLANT
CUFF TRNQT CYL 34X4.125X (TOURNIQUET CUFF) ×1 IMPLANT
DERMABOND ADVANCED .7 DNX12 (GAUZE/BANDAGES/DRESSINGS) ×1 IMPLANT
DRAPE U-SHAPE 47X51 STRL (DRAPES) ×1 IMPLANT
DRESSING AQUACEL AG SP 3.5X10 (GAUZE/BANDAGES/DRESSINGS) ×1 IMPLANT
DURAPREP 26ML APPLICATOR (WOUND CARE) ×2 IMPLANT
ELECT REM PT RETURN 15FT ADLT (MISCELLANEOUS) ×1 IMPLANT
GLOVE BIO SURGEON STRL SZ 6 (GLOVE) ×1 IMPLANT
GLOVE BIOGEL PI IND STRL 6.5 (GLOVE) ×1 IMPLANT
GLOVE BIOGEL PI IND STRL 7.5 (GLOVE) ×1 IMPLANT
GLOVE ORTHO TXT STRL SZ7.5 (GLOVE) ×2 IMPLANT
GOWN STRL REUS W/ TWL LRG LVL3 (GOWN DISPOSABLE) ×2 IMPLANT
HOLDER FOLEY CATH W/STRAP (MISCELLANEOUS) IMPLANT
INSERT MED ATTUNE KNEE 5 7 RT (Insert) IMPLANT
KIT TURNOVER KIT A (KITS) ×1 IMPLANT
MANIFOLD NEPTUNE II (INSTRUMENTS) ×1 IMPLANT
NDL SAFETY ECLIPSE 18X1.5 (NEEDLE) IMPLANT
NS IRRIG 1000ML POUR BTL (IV SOLUTION) ×1 IMPLANT
PACK TOTAL KNEE CUSTOM (KITS) ×1 IMPLANT
PENCIL SMOKE EVACUATOR (MISCELLANEOUS) ×1 IMPLANT
PIN FIX SIGMA LCS THRD HI (PIN) IMPLANT
PROTECTOR NERVE ULNAR (MISCELLANEOUS) ×1 IMPLANT
SET HNDPC FAN SPRY TIP SCT (DISPOSABLE) ×1 IMPLANT
SET PAD KNEE POSITIONER (MISCELLANEOUS) ×1 IMPLANT
SPIKE FLUID TRANSFER (MISCELLANEOUS) ×2 IMPLANT
SUT MNCRL AB 4-0 PS2 18 (SUTURE) ×1 IMPLANT
SUT STRATAFIX PDS+ 0 24IN (SUTURE) ×1 IMPLANT
SUT VIC AB 1 CT1 36 (SUTURE) ×1 IMPLANT
SUT VIC AB 2-0 CT1 TAPERPNT 27 (SUTURE) ×2 IMPLANT
SYR 3ML LL SCALE MARK (SYRINGE) ×1 IMPLANT
TOWEL GREEN STERILE FF (TOWEL DISPOSABLE) ×1 IMPLANT
TRAY FOLEY MTR SLVR 16FR STAT (SET/KITS/TRAYS/PACK) ×1 IMPLANT
TRAY FOLEY SLVR 14FR TEMP STAT (SET/KITS/TRAYS/PACK) IMPLANT
TUBE SUCTION HIGH CAP CLEAR NV (SUCTIONS) ×1 IMPLANT
WATER STERILE IRR 1000ML POUR (IV SOLUTION) ×2 IMPLANT
WRAP KNEE MAXI GEL POST OP (GAUZE/BANDAGES/DRESSINGS) ×1 IMPLANT

## 2023-06-13 NOTE — Anesthesia Procedure Notes (Signed)
 Procedure Name: MAC Date/Time: 06/13/2023 2:25 PM  Performed by: Melodee Spruce, CRNAPre-anesthesia Checklist: Patient identified, Emergency Drugs available, Suction available and Patient being monitored Patient Re-evaluated:Patient Re-evaluated prior to induction Oxygen Delivery Method: Simple face mask Preoxygenation: Pre-oxygenation with 100% oxygen Induction Type: IV induction Placement Confirmation: positive ETCO2 and breath sounds checked- equal and bilateral Dental Injury: Teeth and Oropharynx as per pre-operative assessment

## 2023-06-13 NOTE — Op Note (Signed)
 NAME:  Briana Foster                      MEDICAL RECORD NO.:  [id number]                             FACILITY:  [facility]      PHYSICIAN:  Azalea Lento. Bernard Brick, M.D.  DATE OF BIRTH:  Nov 26, 1945      DATE OF PROCEDURE:  06/13/2023                                     OPERATIVE REPORT         PREOPERATIVE DIAGNOSIS:  Right knee osteoarthritis.      POSTOPERATIVE DIAGNOSIS:  Right knee osteoarthritis.      FINDINGS:  The patient was noted to have complete loss of cartilage and   bone-on-bone arthritis with associated osteophytes in the lateral and patellofemoral compartments of   the knee with a significant synovitis and associated effusion.  The patient had failed months of conservative treatment including medications, injection therapy, activity modification.     PROCEDURE:  Right total knee replacement.      COMPONENTS USED:  DePuy Attune FB CR MS knee   system, a size 5N femur, 6 tibia, size 7 mm CR MS AOX insert, and 38 anatomic patellar   button.      SURGEON:  Azalea Lento. Bernard Brick, M.D.      ASSISTANT:  Kim Pen, PA-C.      ANESTHESIA:  Regional and Spinal.      SPECIMENS:  None.      COMPLICATION:  None.      DRAINS:  None.  EBL: <100 cc      TOURNIQUET TIME:  24 min at 225 mmHg     The patient was stable to the recovery room.      INDICATION FOR PROCEDURE:  Briana Foster is a 78 y.o. female patient of   mine.  The patient had been seen, evaluated, and treated for months conservatively in the   office with medication, activity modification, and injections.  The patient had   radiographic changes of bone-on-bone arthritis with endplate sclerosis and osteophytes noted.  Based on the radiographic changes and failed conservative measures, the patient   decided to proceed with definitive treatment, total knee replacement.  Risks of infection, DVT, component failure, need for revision surgery, neurovascular injury were reviewed in the office setting.  The postop course  was reviewed stressing the efforts to maximize post-operative satisfaction and function.  Consent was obtained for benefit of pain   relief.      PROCEDURE IN DETAIL:  The patient was brought to the operative theater.   Once adequate anesthesia, preoperative antibiotics, 2 gm of Ancef ,1 gm of Tranexamic Acid, and 10 mg of Decadron  administered, the patient was positioned supine with a right thigh tourniquet placed.  The  right lower extremity was prepped and draped in sterile fashion.  A time-   out was performed identifying the patient, planned procedure, and the appropriate extremity.      The right lower extremity was placed in the [facility] leg holder.  The leg was   exsanguinated, tourniquet elevated to 225 mmHg.  A midline incision was   made followed by median parapatellar arthrotomy.  Following initial   exposure, attention was  first directed to the patella.  Precut   measurement was noted to be 21 mm.  I resected down to 13 mm and used a   38 anatomic patellar button to restore patellar height as well as cover the cut surface.      The lug holes were drilled and a metal shim was placed to protect the   patella from retractors and saw blade during the procedure.      At this point, attention was now directed to the femur.  The femoral   canal was opened with a drill, irrigated to try to prevent fat emboli.  An   intramedullary rod was passed at 3 degrees valgus, 9 mm of bone was   resected off the distal femur.  Following this resection, the tibia was   subluxated anteriorly.  Using the extramedullary guide, 3 mm of bone was resected off   the proximal medial tibia.  We confirmed the gap would be   stable medially and laterally with a size 5 spacer block as well as confirmed that the tibial cut was perpendicular in the coronal plane, checking with an alignment rod.      Once this was done, I sized the femur to be a size 5 in the anterior-   posterior dimension, chose a narrow  component based on medial and   lateral dimension.  The size 5 rotation block was then pinned in   position anterior referenced using the C-clamp to set rotation.  The   anterior, posterior, and  chamfer cuts were made without difficulty nor   notching making certain that I was along the anterior cortex to help   with flexion gap stability.      The final box cut was made off the lateral aspect of distal femur.      At this point, the tibia was sized to be a size 6.  The size 6 tray was   then pinned in position through the medial third of the tubercle,   drilled, and keel punched.  Trial reduction was now carried with a 5 femur,  6 tibia, a size 7 mm CR MS insert, and the 38 anatomic patella botton.  The knee was brought to full extension with good flexion stability with the patella   tracking through the trochlea without application of pressure.  Given   all these findings the trial components removed.  Final components were   opened and cement was mixed.  The knee was irrigated with normal saline solution and pulse lavage.  The synovial lining was   then injected with 30 cc of 0.25% Marcaine  with epinephrine, 1 cc of Toradol  and 30 cc of NS for a total of 61 cc.     Final implants were then cemented onto cleaned and dried cut surfaces of bone with the knee brought to extension with a size 7 mm CR MS trial insert.      Once the cement had fully cured, excess cement was removed   throughout the knee.  I confirmed that I was satisfied with the range of   motion and stability, and the final size 7 mm CR MS AOX insert was chosen.  It was   placed into the knee.      The tourniquet had been let down at 24 minutes.  No significant   hemostasis was required.  The extensor mechanism was then reapproximated using #1 Vicryl and #1 Stratafix sutures with the knee   in flexion.  The   remaining wound was closed with 2-0 Vicryl and running 4-0 Monocryl.   The knee was cleaned, dried, dressed  sterilely using Dermabond and   Aquacel dressing.  The patient was then   brought to recovery room in stable condition, tolerating the procedure   well.   Please note that Physician Assistant, Kim Pen, PA-C was present for the entirety of the case, and was utilized for pre-operative positioning, peri-operative retractor management, general facilitation of the procedure and for primary wound closure at the end of the case.              Azalea Lento Bernard Brick, M.D.    06/13/2023 11:52 AM

## 2023-06-13 NOTE — Transfer of Care (Signed)
 Immediate Anesthesia Transfer of Care Note  Patient: Briana Foster  Procedure(s) Performed: ARTHROPLASTY, KNEE, TOTAL (Right: Knee)  Patient Location: PACU  Anesthesia Type:MAC combined with regional for post-op pain  Level of Consciousness: drowsy and patient cooperative  Airway & Oxygen Therapy: Patient Spontanous Breathing and Patient connected to face mask oxygen  Post-op Assessment: Report given to RN and Post -op Vital signs reviewed and stable  Post vital signs: Reviewed and stable  Last Vitals:  Vitals Value Taken Time  BP 122/63 06/13/23 1541  Temp    Pulse 59 06/13/23 1542  Resp 19 06/13/23 1542  SpO2 96 % 06/13/23 1542  Vitals shown include unfiled device data.  Last Pain:  Vitals:   06/13/23 1345  TempSrc: Oral  PainSc: 0-No pain         Complications: No notable events documented.

## 2023-06-13 NOTE — Discharge Instructions (Signed)

## 2023-06-13 NOTE — Anesthesia Procedure Notes (Signed)
 Anesthesia Regional Block: Adductor canal block   Pre-Anesthetic Checklist: , timeout performed,  Correct Patient, Correct Site, Correct Laterality,  Correct Procedure, Correct Position, site marked,  Risks and benefits discussed,  Surgical consent,  Pre-op evaluation,  At surgeon's request and post-op pain management  Laterality: Right  Prep: chloraprep       Needles:  Injection technique: Single-shot  Needle Type: Echogenic Stimulator Needle     Needle Length: 9cm  Needle Gauge: 21     Additional Needles:   Procedures:,,,, ultrasound used (permanent image in chart),,    Narrative:  Start time: 06/13/2023 1:21 PM End time: 06/13/2023 1:25 PM Injection made incrementally with aspirations every 5 mL.  Performed by: Personally  Anesthesiologist: Conard Decent, MD  Additional Notes: Discussed risks and benefits of nerve block including, but not limited to, prolonged and/or permanent nerve injury involving sensory and/or motor function. Monitors were applied and a time-out was performed. The nerve and associated structures were visualized under ultrasound guidance. After negative aspiration, local anesthetic was slowly injected around the nerve. There was no evidence of high pressure during the procedure. There were no paresthesias. VSS remained stable and the patient tolerated the procedure well.

## 2023-06-13 NOTE — Progress Notes (Signed)
 Notified Dr Leilani Punter of elevation in temp and BP.

## 2023-06-13 NOTE — Anesthesia Procedure Notes (Addendum)
 Spinal  Patient location during procedure: OR Start time: 06/13/2023 2:11 PM End time: 06/13/2023 2:16 PM Reason for block: surgical anesthesia Staffing Performed: anesthesiologist  Anesthesiologist: Conard Decent, MD Performed by: Conard Decent, MD Authorized by: Conard Decent, MD   Preanesthetic Checklist Completed: patient identified, IV checked, site marked, risks and benefits discussed, surgical consent, monitors and equipment checked, pre-op evaluation and timeout performed Spinal Block Patient position: sitting Prep: DuraPrep Patient monitoring: blood pressure and continuous pulse ox Approach: midline Location: L3-4 Injection technique: single-shot Needle Needle type: Pencan  Needle gauge: 24 G Needle length: 9 cm Additional Notes Risks and benefits of neuraxial anesthesia including, but not limited to, infection, bleeding, local anesthetic toxicity, headache, hypotension, back pain, block failure, etc. were discussed with the patient. The patient expressed understanding and consented to the procedure. I confirmed that the patient has no bleeding disorders and is not taking blood thinners. I confirmed the patient's last platelet count with the nurse. Monitors were applied. A time-out was performed immediately prior to the procedure. Sterile technique was used throughout the whole procedure.   1 attempt(s)

## 2023-06-13 NOTE — Anesthesia Postprocedure Evaluation (Signed)
 Anesthesia Post Note  Patient: Briana Foster  Procedure(s) Performed: ARTHROPLASTY, KNEE, TOTAL (Right: Knee)     Patient location during evaluation: PACU Anesthesia Type: MAC and Spinal Level of consciousness: awake Pain management: pain level controlled Vital Signs Assessment: post-procedure vital signs reviewed and stable Respiratory status: spontaneous breathing, respiratory function stable and nonlabored ventilation Cardiovascular status: blood pressure returned to baseline and stable Postop Assessment: no headache, no backache and no apparent nausea or vomiting Anesthetic complications: no   No notable events documented.  Last Vitals:  Vitals:   06/13/23 1700 06/13/23 1715  BP: (!) 135/95 138/87  Pulse: 60 63  Resp: 16 16  Temp:    SpO2: 98% 92%    Last Pain:  Vitals:   06/13/23 1715  TempSrc:   PainSc: 0-No pain                 Conard Decent

## 2023-06-13 NOTE — Care Plan (Signed)
 Ortho Bundle Case Management Note  Patient Details  Name: Briana Foster MRN: [id number] Date of Birth: October 14, 1945  R TKA on 06/13/23   DCP: Home with daughter DME: No needs. Has RW and cane PT: [facility]                    DME Arranged:  N/A DME Agency:  NA  HH Arranged:    HH Agency:     Additional Comments: Please contact me with any questions of if this plan should need to change.  Bronwen Canon, [place] [facility] [phone]   06/13/2023, 11:29 AM

## 2023-06-13 NOTE — Interval H&P Note (Signed)
 History and Physical Interval Note:  06/13/2023 11:52 AM  Briana Foster  has presented today for surgery, with the diagnosis of Right knee osteoarthritis.  The various methods of treatment have been discussed with the patient and family. After consideration of risks, benefits and other options for treatment, the patient has consented to  Procedure(s): ARTHROPLASTY, KNEE, TOTAL (Right) as a surgical intervention.  The patient's history has been reviewed, patient examined, no change in status, stable for surgery.  I have reviewed the patient's chart and labs.  Questions were answered to the patient's satisfaction.     Bevin Bucks

## 2023-06-14 ENCOUNTER — Encounter (HOSPITAL_COMMUNITY): Payer: Self-pay | Admitting: Orthopedic Surgery

## 2023-06-14 LAB — CBC
HCT: 37.4 % (ref 36.0–46.0)
Hemoglobin: 12.4 g/dL (ref 12.0–15.0)
MCH: 32 pg (ref 26.0–34.0)
MCHC: 33.2 g/dL (ref 30.0–36.0)
MCV: 96.4 fL (ref 80.0–100.0)
Platelets: 180 10*3/uL (ref 150–400)
RBC: 3.88 MIL/uL (ref 3.87–5.11)
RDW: 12.9 % (ref 11.5–15.5)
WBC: 10.7 10*3/uL — ABNORMAL HIGH (ref 4.0–10.5)
nRBC: 0 % (ref 0.0–0.2)

## 2023-06-14 LAB — GLUCOSE, CAPILLARY
Glucose-Capillary: 204 mg/dL — ABNORMAL HIGH (ref 70–99)
Glucose-Capillary: 216 mg/dL — ABNORMAL HIGH (ref 70–99)
Glucose-Capillary: 188 mg/dL — ABNORMAL HIGH (ref 70–99)
Glucose-Capillary: 195 mg/dL — ABNORMAL HIGH (ref 70–99)

## 2023-06-14 LAB — BASIC METABOLIC PANEL WITH GFR
Anion gap: 8 (ref 5–15)
BUN: 15 mg/dL (ref 8–23)
CO2: 24 mmol/L (ref 22–32)
Calcium: 9.9 mg/dL (ref 8.9–10.3)
Chloride: 107 mmol/L (ref 98–111)
Creatinine, Ser: 0.67 mg/dL (ref 0.44–1.00)
GFR, Estimated: >60 mL/min (ref >60)
Glucose, Bld: 230 mg/dL — ABNORMAL HIGH (ref 70–99)
Potassium: 4.2 mmol/L (ref 3.5–5.1)
Sodium: 139 mmol/L (ref 135–145)

## 2023-06-14 MED ORDER — SENNA 8.6 MG PO TABS: 2.0000 | ORAL_TABLET | Freq: Every day | ORAL | 0 refills | Status: AC

## 2023-06-14 MED ORDER — POLYETHYLENE GLYCOL 3350 17 G PO PACK
17.0000 g | PACK | Freq: Two times a day (BID) | ORAL | 0 refills | Status: DC
Start: 2023-06-14 — End: 2023-12-09

## 2023-06-14 MED ORDER — METHOCARBAMOL 500 MG PO TABS: 500.0000 mg | ORAL_TABLET | Freq: Four times a day (QID) | ORAL | 2 refills | Status: DC | PRN

## 2023-06-14 NOTE — TOC Transition Note (Addendum)
 Transition of Care [facility]) - Discharge Note   Patient Details  Name: Briana Foster MRN: [id number] Date of Birth: [birth year]  [facility] CM/SW Contact:  Bari Leys, RN Phone Number: 06/14/2023, 11:07 AM   Clinical Narrative:  Met with patient at bedside to review dc therapy and home equipment needs, pt confirmed OPPT at [facility], has RW, no home DME needs. MOON completed.  No TOC needs.       Final next level of care: OP Rehab     Patient Goals and CMS Choice Patient states their goals for this hospitalization and ongoing recovery are:: return home          Discharge Placement                       Discharge Plan and Services Additional resources added to the After Visit Summary for                  DME Arranged: N/A DME Agency: NA                  Social Drivers of Health (SDOH) Interventions SDOH Screenings   Food Insecurity: No Food Insecurity (06/13/2023)  Housing: Low Risk  (06/13/2023)  Recent Concern: Housing - High Risk (04/13/2023)  Transportation Needs: No Transportation Needs (06/13/2023)  Utilities: Not At Risk (06/13/2023)  Recent Concern: Utilities - At Risk (04/13/2023)  Depression (PHQ2-9): Medium Risk (03/22/2018)  Social Connections: Moderately Integrated (06/13/2023)  Tobacco Use: Low Risk  (06/13/2023)     Readmission Risk Interventions    11/05/2021    3:55 PM  Readmission Risk Prevention Plan  Post Dischage Appt Complete  Medication Screening Complete  Transportation Screening Complete

## 2023-06-14 NOTE — Evaluation (Signed)
 Physical Therapy Evaluation Patient Details Name: Briana Foster MRN: [id number] DOB: [birth year] Today's Date: 06/14/2023  History of Present Illness  Briana Foster is a 78 yo female s/p R TKA 5/13. 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 Pt reports ind at baseline, using hurry-cane intermittently in the home and community, daughter and granddaughter live with pt and can assist post-op. On eval, pt comes to EOB easily, self assisting RLE, using bedrail and elevated HOB, supv for safety. Pt powers to stand with mod A to power up, slow to rise and weight shift to RLE, needing cues for hip extension as pt wants to lean on RW uprights for support. Pt able to take 2-3 steps to recliner at bedside, mod A to assist and heavy BUE weightbearing on RW to decreased RLE stance and weightbearing. Once in recliner, pt with breakfast tray; placed ice on knee and educated on additional PT sessions to progress mobility and pt verbalizes understanding. Plan to continue progressing mobility with goal to d/c home with family support. Pt will benefit from acute skilled PT to increase their independence and safety with mobility to allow discharge.          If plan is discharge home, recommend the following: A little help with bathing/dressing/bathroom;Assistance with cooking/housework;Assist for transportation;Help with stairs or ramp for entrance;A lot of help with walking and/or transfers   Can travel by private vehicle        Equipment Recommendations Rolling walker (2 wheels)  Recommendations for Other Services       Functional Status Assessment Patient has had a recent decline in their functional status and demonstrates the ability to make significant improvements in function in a reasonable and predictable amount of time.     Precautions /  Restrictions Precautions Precautions: Fall;Knee Recall of Precautions/Restrictions: Intact Restrictions Weight Bearing Restrictions Per Provider Order: No      Mobility  Bed Mobility Overal bed mobility: Needs Assistance Bed Mobility: Supine to Sit     Supine to sit: Supervision, HOB elevated, Used rails     General bed mobility comments: strong use of bedrail and HOB elevated ~40 seg, pt self assisting RLE to EOB    Transfers Overall transfer level: Needs assistance Equipment used: Rolling walker (2 wheels) Transfers: Sit to/from Stand, Bed to chair/wheelchair/BSC Sit to Stand: Mod assist   Step pivot transfers: Mod assist       General transfer comment: mod A to power up from EOB, slow to rise, cued for hip extension as pt initially leaning on RW uprights with chest, once standing mod A for step pivot to recliner, increased time and cues, heavy use of BUE on RW to minimize RLE weightbearing    Ambulation/Gait               General Gait Details: pt able to take 2-3 steps from EOB to recliner, no LE buckling noted, strong use of BUE on RW wtih minimal RLE weightbearing  Stairs            Wheelchair Mobility     Tilt Bed    Modified Rankin (Stroke Patients Only)       Balance Overall balance assessment: Needs assistance Sitting-balance support: Feet supported Sitting balance-Leahy Scale: Good     Standing balance support: Reliant on assistive device for balance, During functional activity, Bilateral upper  extremity supported Standing balance-Leahy Scale: Poor                               Pertinent Vitals/Pain Pain Assessment Pain Assessment: Faces Faces Pain Scale: Hurts even more Pain Location: R knee with weightbearing Pain Descriptors / Indicators: Grimacing, Guarding Pain Intervention(s): Limited activity within patient's tolerance, Monitored during session, Repositioned, Premedicated before session, Ice applied    Home  Living Family/patient expects to be discharged to:: Private residence Living Arrangements: Children Available Help at Discharge: Family;Available 24 hours/day Type of Home: House Home Access: Stairs to enter Entrance Stairs-Rails: Right;Left;Can reach both Entrance Stairs-Number of Steps: 3   Home Layout: One level Home Equipment: Rollator (4 wheels);Cane - single point;Shower seat - built in;BSC/3in1      Prior Function Prior Level of Function : Independent/Modified Independent             Mobility Comments: pt reports using hurry-cane as needed ADLs Comments: pt reports ind     Extremity/Trunk Assessment   Upper Extremity Assessment Upper Extremity Assessment: Overall WFL for tasks assessed    Lower Extremity Assessment Lower Extremity Assessment: RLE deficits/detail RLE Deficits / Details: ankle AROM WFL, knee with good quad set, able to perform LAQ and SLR with increased time RLE Sensation: WNL RLE Coordination: WNL    Cervical / Trunk Assessment Cervical / Trunk Assessment: Normal  Communication   Communication Communication: No apparent difficulties    Cognition Arousal: Alert Behavior During Therapy: WFL for tasks assessed/performed   PT - Cognitive impairments: No apparent impairments                         Following commands: Intact       Cueing       General Comments      Exercises Total Joint Exercises Ankle Circles/Pumps: AROM, Both, 5 reps, Supine Long Arc Quad: AROM, Both, 5 reps, Seated   Assessment/Plan    PT Assessment Patient needs continued PT services  PT Problem List Decreased strength;Decreased range of motion;Decreased activity tolerance;Decreased balance;Decreased knowledge of use of DME;Decreased safety awareness;Pain       PT Treatment Interventions DME instruction;Gait training;Stair training;Functional mobility training;Therapeutic activities;Therapeutic exercise;Balance training;Neuromuscular  re-education;Patient/family education;Modalities    PT Goals (Current goals can be found in the Care Plan section)  Acute Rehab PT Goals Patient Stated Goal: return home with daughter to assist PT Goal Formulation: With patient Time For Goal Achievement: 06/28/23 Potential to Achieve Goals: Good    Frequency 7X/week     Co-evaluation               AM-PAC PT "6 Clicks" Mobility  Outcome Measure Help needed turning from your back to your side while in a flat bed without using bedrails?: A Little Help needed moving from lying on your back to sitting on the side of a flat bed without using bedrails?: A Little Help needed moving to and from a bed to a chair (including a wheelchair)?: A Lot Help needed standing up from a chair using your arms (e.g., wheelchair or bedside chair)?: A Lot Help needed to walk in hospital room?: A Lot Help needed climbing 3-5 steps with a railing? : Total 6 Click Score: 13    End of Session Equipment Utilized During Treatment: Gait belt Activity Tolerance: Patient tolerated treatment well Patient left: in chair;with call bell/phone within reach;with chair alarm set Nurse Communication:  Mobility status;Other (comment) (pill on floor) PT Visit Diagnosis: Other abnormalities of gait and mobility (R26.89);Muscle weakness (generalized) (M62.81);Pain Pain - Right/Left: Right Pain - part of body: Knee    Time: 8295-6213 PT Time Calculation (min) (ACUTE ONLY): 22 min   Charges:   PT Evaluation $PT Eval Low Complexity: 1 Low   PT General Charges $$ ACUTE PT VISIT: 1 Visit         Tori Shonice Wrisley PT, DPT 06/14/23, 9:33 AM

## 2023-06-14 NOTE — Progress Notes (Signed)
 Physical Therapy Treatment Patient Details Name: Briana Foster MRN: [id number] DOB: 02-22-1945 Today's Date: 06/14/2023   History of Present Illness Briana Foster is a 78 yo female s/p R TKA 5/13. 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 Pt performs LAQ and heel slides while seated EOB, no complaints. Upon standing, pt takes slow short steps, noted to have trunk sway, reports still tired, difficulty waking up so returned to sitting and checked vitals. Pt VSS on RA, remains in recliner at EOS and notified RN of vitals and lethargy. Pt on 2L with SpO2 95% upon arrival, removed for PT session with SpO2 90-95% on RA, returned 2L O2 at EOS. Plan to continue progressing mobility with goal to return home with family support.   If plan is discharge home, recommend the following: A little help with bathing/dressing/bathroom;Assistance with cooking/housework;Assist for transportation;Help with stairs or ramp for entrance;A lot of help with walking and/or transfers   Can travel by private vehicle        Equipment Recommendations  Rolling walker (2 wheels)    Recommendations for Other Services       Precautions / Restrictions Precautions Precautions: Fall;Knee Recall of Precautions/Restrictions: Intact Restrictions Weight Bearing Restrictions Per Provider Order: No     Mobility  Bed Mobility Overal bed mobility: Needs Assistance Bed Mobility: Supine to Sit     Supine to sit: Supervision, HOB elevated, Used rails     General bed mobility comments: self assists RLE to EOB, HOB elevated wtih strong BUE use on bedrail to also upright trunk and slide forward    Transfers Overall transfer level: Needs assistance Equipment used: Rolling walker (2 wheels) Transfers: Sit to/from Stand Sit to Stand: Mod assist   Step pivot transfers: Mod  assist       General transfer comment: mod A to power to stand, slow to power up, verbal cues for hand placement, no knee buckling noted    Ambulation/Gait Ambulation/Gait assistance: Mod assist, +2 safety/equipment Gait Distance (Feet): 5 Feet Assistive device: Rolling walker (2 wheels) Gait Pattern/deviations: Step-to pattern, Decreased stride length, Wide base of support Gait velocity: decreased     General Gait Details: slow step-to gait pattern wtih decreased RLE stance time and weightbearing, noted to have trunk sway in static standing and difficulty maintaining eyes opened, recliner follow for safety, pt reports being tired, further amb not attempted due to safety concerns, VSS on RA- alerted RN   Stairs             Wheelchair Mobility     Tilt Bed    Modified Rankin (Stroke Patients Only)       Balance Overall balance assessment: Needs assistance Sitting-balance support: Feet supported Sitting balance-Leahy Scale: Good     Standing balance support: Reliant on assistive device for balance, During functional activity, Bilateral upper extremity supported Standing balance-Leahy Scale: Poor                              Communication Communication Communication: No apparent difficulties  Cognition Arousal: Lethargic Behavior During Therapy: Flat affect   PT - Cognitive impairments: No apparent impairments                       PT - Cognition Comments: pt flat, lethargic, reports taking  pain medication and now difficulty staying awake, able to follow 1 step commands Following commands: Impaired Following commands impaired: Follows one step commands with increased time    Cueing    Exercises Total Joint Exercises Ankle Circles/Pumps: AROM, Both, 5 reps, Supine Heel Slides: AROM, Right, 5 reps, Seated Long Arc Quad: AROM, Both, 5 reps, Seated    General Comments General comments (skin integrity, edema, etc.): 122/59, HR 62-59 and  SpO2 90-95% on RA after amb attempt, pt reporting being tired and "fuzzy" that doesn't change with positional changes - notified RN      Pertinent Vitals/Pain Pain Assessment Pain Assessment: 0-10 Pain Score: 2  Faces Pain Scale: Hurts even more Pain Location: R knee Pain Descriptors / Indicators: Grimacing, Guarding Pain Intervention(s): Limited activity within patient's tolerance, Monitored during session, Repositioned, Ice applied    Home Living                          Prior Function            PT Goals (current goals can now be found in the care plan section) Acute Rehab PT Goals Patient Stated Goal: return home with daughter to assist PT Goal Formulation: With patient Time For Goal Achievement: 06/28/23 Potential to Achieve Goals: Good Progress towards PT goals: Progressing toward goals    Frequency    7X/week      PT Plan      Co-evaluation              AM-PAC PT "6 Clicks" Mobility   Outcome Measure  Help needed turning from your back to your side while in a flat bed without using bedrails?: A Little Help needed moving from lying on your back to sitting on the side of a flat bed without using bedrails?: A Little Help needed moving to and from a bed to a chair (including a wheelchair)?: A Lot Help needed standing up from a chair using your arms (e.g., wheelchair or bedside chair)?: A Lot Help needed to walk in hospital room?: A Lot Help needed climbing 3-5 steps with a railing? : Total 6 Click Score: 13    End of Session Equipment Utilized During Treatment: Gait belt Activity Tolerance: Patient limited by lethargy Patient left: in chair;with call bell/phone within reach;with chair alarm set Nurse Communication: Mobility status;Other (comment) (lethargy and vitals) PT Visit Diagnosis: Other abnormalities of gait and mobility (R26.89);Muscle weakness (generalized) (M62.81);Pain Pain - Right/Left: Right Pain - part of body: Knee      Time: 1151-1208 PT Time Calculation (min) (ACUTE ONLY): 17 min  Charges:    $Therapeutic Activity: 8-22 mins PT General Charges $$ ACUTE PT VISIT: 1 Visit                     Tori Carneshia Raker PT, DPT 06/14/23, 12:19 PM

## 2023-06-14 NOTE — Progress Notes (Signed)
 Subjective: 1 Day Post-Op Procedure(s) (LRB): ARTHROPLASTY, KNEE, TOTAL (Right) Patient reports pain as mild.   Patient seen in rounds by Dr. Bernard Brick. Patient is well, and has had no acute complaints or problems. No acute events overnight. Foley catheter removed. Patient did not get up with PT yet.  We will start therapy today.   Objective: Vital signs in last 24 hours: Temp:  [97.5 F (36.4 C)-98.1 F (36.7 C)] 97.6 F (36.4 C) (05/14 0543) Pulse Rate:  [59-63] 60 (05/14 0543) Resp:  [11-20] 16 (05/14 0543) BP: (100-171)/(63-97) 135/85 (05/14 0543) SpO2:  [91 %-99 %] 91 % (05/14 0543) Weight:  [88.5 kg] 88.5 kg (05/13 1223)  Intake/Output from previous day:  Intake/Output Summary (Last 24 hours) at 06/14/2023 0745 Last data filed at 06/14/2023 0552 Gross per 24 hour  Intake 1200 ml  Output 1875 ml  Net -675 ml     Intake/Output this shift: No intake/output data recorded.  Labs: Recent Labs    06/14/23 0349  HGB 12.4   Recent Labs    06/14/23 0349  WBC 10.7*  RBC 3.88  HCT 37.4  PLT 180   Recent Labs    06/14/23 0349  NA 139  K 4.2  CL 107  CO2 24  BUN 15  CREATININE 0.67  GLUCOSE 230*  CALCIUM  9.9   No results for input(s): "LABPT", "INR" in the last 72 hours.  Exam: General - Patient is Alert and Oriented Extremity - Neurologically intact Sensation intact distally Intact pulses distally Dorsiflexion/Plantar flexion intact Dressing - dressing C/D/I Motor Function - intact, moving foot and toes well on exam.   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 Abel Hoe & Dr. Alva Jewels   Severe aortic stenosis    Type 2 diabetes mellitus without complication, without long-term current use of insulin  (HCC) 04/10/2018   Vitamin D  deficiency     Assessment/Plan: 1 Day Post-Op Procedure(s) (LRB): ARTHROPLASTY, KNEE, TOTAL (Right) Principal Problem:   S/P total knee arthroplasty, right  Estimated body mass index is 29.65 kg/m as calculated from the following:   Height as of this encounter: 5\' 8"  (1.727 m).   Weight as of this encounter: 88.5 kg. Advance diet Up with therapy   Patient's anticipated LOS is less than 2 midnights, meeting these requirements: - Younger than 43 - Lives within 1 hour of care - Has a competent adult at home to recover with post-op recover - NO history of  - Chronic pain  requiring opiods  - Diabetes  - Coronary Artery Disease  - Heart failure  - Heart attack  - Stroke  - DVT/VTE  - Cardiac arrhythmia  - Respiratory Failure/COPD  - Renal failure  - Anemia  - Advanced Liver disease     DVT Prophylaxis - Eliquis  Weight bearing as tolerated.  Hgb stable at 12.4 this AM  Plan is to go Home after hospital stay. Plan for discharge today following 1-2 sessions of PT as long as they are meeting their goals. Patient is scheduled for OPPT. Follow up in the office in 2 weeks.   Kim Pen, PA-C Orthopedic Surgery [phone] 06/14/2023, 7:45 AM

## 2023-06-14 NOTE — Plan of Care (Signed)

## 2023-06-14 NOTE — Care Management Obs Status (Signed)
 MEDICARE OBSERVATION STATUS NOTIFICATION   Patient Details  Name: Briana Foster MRN: [id number] Date of Birth: [birth year]   Medicare Observation Status Notification Given:  Yes    Bari Leys, RN 06/14/2023, 9:39 AM

## 2023-06-15 LAB — CBC
HCT: 33.8 % — ABNORMAL LOW (ref 36.0–46.0)
Hemoglobin: 11.1 g/dL — ABNORMAL LOW (ref 12.0–15.0)
MCH: 32.3 pg (ref 26.0–34.0)
MCHC: 32.8 g/dL (ref 30.0–36.0)
MCV: 98.3 fL (ref 80.0–100.0)
Platelets: 178 10*3/uL (ref 150–400)
RBC: 3.44 MIL/uL — ABNORMAL LOW (ref 3.87–5.11)
RDW: 13.3 % (ref 11.5–15.5)
WBC: 17 10*3/uL — ABNORMAL HIGH (ref 4.0–10.5)
nRBC: 0 % (ref 0.0–0.2)

## 2023-06-15 LAB — GLUCOSE, CAPILLARY
Glucose-Capillary: 154 mg/dL — ABNORMAL HIGH (ref 70–99)
Glucose-Capillary: 182 mg/dL — ABNORMAL HIGH (ref 70–99)
Glucose-Capillary: 136 mg/dL — ABNORMAL HIGH (ref 70–99)
Glucose-Capillary: 183 mg/dL — ABNORMAL HIGH (ref 70–99)

## 2023-06-15 MED ORDER — TRAMADOL HCL 50 MG PO TABS
50.0000 mg | ORAL_TABLET | Freq: Four times a day (QID) | ORAL | Status: DC | PRN
  Administered 2023-06-15 – 2023-06-18 (×6): 100 mg via ORAL
  Administered 2023-06-18 – 2023-06-19 (×3): 50 mg via ORAL
  Administered 2023-06-19 – 2023-06-20 (×3): 100 mg via ORAL
  Filled 2023-06-15 (×9): qty 2
  Filled 2023-06-15: qty 1
  Filled 2023-06-15 (×2): qty 2

## 2023-06-15 NOTE — Progress Notes (Signed)
 Physical Therapy Treatment Patient Details Name: Briana Foster MRN: [id number] DOB: 02/04/1945 Today's Date: 06/15/2023   History of Present Illness Briana Foster is a 78 yo female s/p R TKA 5/13. PMH: arthritis, back pain, CHF, CKF, HTN, afib, diabetes, s/p TAVR 06/30/2020, pacemaker 10/28/20    PT Comments  POD 2, pt more alert, able to follow commands, motivated to return home with daughter and granddaughter to assist. Pt needs mod A to power up to standing and with minor LOB posterior needing assist to prevent fall in standing with RW. Pt ab 65ft with RW, close recliner follow, min A for unsteadiness, very slow step-to gait pattern with significantly decreased RLE stance time and weightbearing increasing L foot clearance difficulty and requiring 2 small steps or sliding L foot step progression at times. RN in room at [facility] with pain medication and notified of O2 and SpO2. Plan to continue progressing mobility as able.   If plan is discharge home, recommend the following: A little help with bathing/dressing/bathroom;Assistance with cooking/housework;Assist for transportation;Help with stairs or ramp for entrance;A lot of help with walking and/or transfers   Can travel by private vehicle        Equipment Recommendations  Rolling walker (2 wheels)    Recommendations for Other Services       Precautions / Restrictions Precautions Precautions: Fall;Knee Recall of Precautions/Restrictions: Intact Restrictions Weight Bearing Restrictions Per Provider Order: No RLE Weight Bearing Per Provider Order: Weight bearing as tolerated     Mobility  Bed Mobility Overal bed mobility: Needs Assistance Bed Mobility: Supine to Sit     Supine to sit: Supervision, HOB elevated, Used rails     General bed mobility comments: slow and increased effort, using bedrail and elevated HOB ot mobilize to EOB, self assisting RLE as needed    Transfers Overall transfer level: Needs assistance Equipment  used: Rolling walker (2 wheels) Transfers: Sit to/from Stand Sit to Stand: Mod assist           General transfer comment: mod A to power up to standing, slow to rise with hip extension last, minor LOB once upright needing assist to prevent fall; assist for controlled lowering back to sitting in recliner    Ambulation/Gait Ambulation/Gait assistance: Min assist, +2 safety/equipment Gait Distance (Feet): 20 Feet Assistive device: Rolling walker (2 wheels) Gait Pattern/deviations: Step-to pattern, Decreased stride length, Wide base of support, Decreased weight shift to right, Decreased stance time - right Gait velocity: decreased     General Gait Details: slow step-to gait pattern, significantly decreased RLE stance time and wieghtbearing requiring L foot to take 2 small hops to meet R foot, strong UE weightbearing on RW, close recliner follow for safety, no LOB or knee buckling but slightly unsteady and limited by pain, cues for pursed lip breathing   Stairs             Wheelchair Mobility     Tilt Bed    Modified Rankin (Stroke Patients Only)       Balance Overall balance assessment: Needs assistance Sitting-balance support: Feet supported Sitting balance-Leahy Scale: Good     Standing balance support: Reliant on assistive device for balance, During functional activity, Bilateral upper extremity supported Standing balance-Leahy Scale: Poor                              Communication Communication Communication: No apparent difficulties  Cognition Arousal: Alert Behavior During Therapy: [facility] for  tasks assessed/performed   PT - Cognitive impairments: No apparent impairments                       PT - Cognition Comments: pt with improved cognition this session, able to follow commands and stay alert Following commands: Intact      Cueing    Exercises      General Comments General comments (skin integrity, edema, etc.): pt on 4L O2  upon arrival with SPO2 95%, placed on RA and SpO2 93% while seated, 94% post ambulation; returned to room and left on RA and in recliner with Spo2 94%- RN notified      Pertinent Vitals/Pain Pain Assessment Pain Assessment: 0-10 Pain Score: 7  Pain Location: R knee Pain Descriptors / Indicators: Grimacing, Guarding Pain Intervention(s): Limited activity within patient's tolerance, Monitored during session, Premedicated before session, Repositioned, Patient requesting pain meds-RN notified, Ice applied    Home Living                          Prior Function            PT Goals (current goals can now be found in the care plan section) Acute Rehab PT Goals Patient Stated Goal: return home with daughter to assist PT Goal Formulation: With patient Time For Goal Achievement: 06/28/23 Potential to Achieve Goals: Good Progress towards PT goals: Progressing toward goals (slowly)    Frequency    7X/week      PT Plan      Co-evaluation              AM-PAC PT "6 Clicks" Mobility   Outcome Measure  Help needed turning from your back to your side while in a flat bed without using bedrails?: A Little Help needed moving from lying on your back to sitting on the side of a flat bed without using bedrails?: A Little Help needed moving to and from a bed to a chair (including a wheelchair)?: A Lot Help needed standing up from a chair using your arms (e.g., wheelchair or bedside chair)?: A Lot Help needed to walk in hospital room?: A Lot Help needed climbing 3-5 steps with a railing? : Total 6 Click Score: 13    End of Session Equipment Utilized During Treatment: Gait belt Activity Tolerance: Patient limited by pain;Patient tolerated treatment well Patient left: in chair;with call bell/phone within reach;with chair alarm set;with nursing/sitter in room Nurse Communication: Mobility status PT Visit Diagnosis: Other abnormalities of gait and mobility (R26.89);Muscle  weakness (generalized) (M62.81);Pain Pain - Right/Left: Right Pain - part of body: Knee     Time: 1478-2956 PT Time Calculation (min) (ACUTE ONLY): 22 min  Charges:    $Gait Training: 8-22 mins PT General Charges $$ ACUTE PT VISIT: 1 Visit                     Tori Harshal Sirmon PT, DPT 06/15/23, 10:06 AM

## 2023-06-15 NOTE — Progress Notes (Addendum)
 Physical Therapy Treatment Patient Details Name: Briana Foster MRN: [id number] DOB: 1945/09/26 Today's Date: 06/15/2023   History of Present Illness Briana Foster is a 78 yo female s/p R TKA 5/13. PMH: arthritis, back pain, CHF, CKF, HTN, afib, diabetes, s/p TAVR 06/30/2020, pacemaker 10/28/20    PT Comments  Pt making slow progress, continues to be limited by pain. Pt powers to stand with mod A from low recliner. Pt amb 40 ft with slow step to gait pattern, continues to demo significantly decreased RLE stance time and weightbearing with difficulty clearing L foot up to meet R, strong use of BUE on RW, intermittent forward weight shift over RW crossbar despite intermittent cues for positioning within RW and needing min A to prevent fall.  Pt's granddaughter and grandson present during session report pt has 5-6 steps with bil handrail to enter the home. Plan to progress mobility and successfully complete stair training prior to return home with family support.   If plan is discharge home, recommend the following: A little help with bathing/dressing/bathroom;Assistance with cooking/housework;Assist for transportation;Help with stairs or ramp for entrance;A lot of help with walking and/or transfers   Can travel by private vehicle        Equipment Recommendations  Rolling walker (2 wheels)    Recommendations for Other Services       Precautions / Restrictions Precautions Precautions: Fall;Knee Recall of Precautions/Restrictions: Intact Restrictions Weight Bearing Restrictions Per Provider Order: No RLE Weight Bearing Per Provider Order: Weight bearing as tolerated     Mobility  Bed Mobility Overal bed mobility: Needs Assistance Bed Mobility: Supine to Sit     Supine to sit: Supervision, HOB elevated, Used rails     General bed mobility comments: in recliner upon arrival    Transfers Overall transfer level: Needs assistance Equipment used: Rolling walker (2  wheels) Transfers: Sit to/from Stand Sit to Stand: Mod assist           General transfer comment: mod A to power up from low seated recliner with strong BUE assisting, pt attempting to lean on RW to steady self once standing    Ambulation/Gait Ambulation/Gait assistance: Min assist, +2 safety/equipment Gait Distance (Feet): 40 Feet Assistive device: Rolling walker (2 wheels) Gait Pattern/deviations: Step-to pattern, Decreased stride length, Decreased weight shift to right, Decreased stance time - right Gait velocity: decreased     General Gait Details: slow step-to gait pattern with RW, minimal RLE stance time and weightbearing with increased difficulty progressing L foot up to meet R, heavy BUE weightbearing on RW, unsteadiness with intermittent forward weightshift over RW crossbar needing assist to prevent fall and reposition self in RW frame   Stairs             Wheelchair Mobility     Tilt Bed    Modified Rankin (Stroke Patients Only)       Balance Overall balance assessment: Needs assistance Sitting-balance support: Feet supported Sitting balance-Leahy Scale: Good     Standing balance support: Reliant on assistive device for balance, During functional activity, Bilateral upper extremity supported Standing balance-Leahy Scale: Poor                              Communication Communication Communication: No apparent difficulties  Cognition Arousal: Alert Behavior During Therapy: WFL for tasks assessed/performed   PT - Cognitive impairments: No apparent impairments  PT - Cognition Comments: pt with improved cognition this session, able to follow commands and stay alert Following commands: Intact      Cueing    Exercises      General Comments General comments (skin integrity, edema, etc.): pt on RA with SpO2 89-96% on RA, educated on avoiding breath holding and pursed lip breathing wtih activity       Pertinent Vitals/Pain Pain Assessment Pain Assessment: 0-10 Pain Score: 8  Pain Location: R knee Pain Descriptors / Indicators: Grimacing, Guarding Pain Intervention(s): Limited activity within patient's tolerance, Monitored during session, Repositioned, Patient requesting pain meds-RN notified    Home Living                          Prior Function            PT Goals (current goals can now be found in the care plan section) Acute Rehab PT Goals Patient Stated Goal: return home with daughter to assist PT Goal Formulation: With patient Time For Goal Achievement: 06/28/23 Potential to Achieve Goals: Good Progress towards PT goals: Progressing toward goals    Frequency    7X/week      PT Plan      Co-evaluation              AM-PAC PT "6 Clicks" Mobility   Outcome Measure  Help needed turning from your back to your side while in a flat bed without using bedrails?: A Little Help needed moving from lying on your back to sitting on the side of a flat bed without using bedrails?: A Little Help needed moving to and from a bed to a chair (including a wheelchair)?: A Lot Help needed standing up from a chair using your arms (e.g., wheelchair or bedside chair)?: A Lot Help needed to walk in hospital room?: A Lot Help needed climbing 3-5 steps with a railing? : Total 6 Click Score: 13    End of Session Equipment Utilized During Treatment: Gait belt Activity Tolerance: Patient limited by pain;Patient tolerated treatment well Patient left: in chair;with call bell/phone within reach;with chair alarm set;with family/visitor present Nurse Communication: Mobility status;Patient requests pain meds PT Visit Diagnosis: Other abnormalities of gait and mobility (R26.89);Muscle weakness (generalized) (M62.81);Pain Pain - Right/Left: Right Pain - part of body: Knee     Time: 1301-1330 PT Time Calculation (min) (ACUTE ONLY): 29 min  Charges:    $Gait Training:  23-37 mins PT General Charges $$ ACUTE PT VISIT: 1 Visit                      Tori Olumide Dolinger PT, DPT 06/15/23, 1:46 PM

## 2023-06-15 NOTE — Progress Notes (Signed)
 Subjective: 2 Days Post-Op Procedure(s) (LRB): ARTHROPLASTY, KNEE, TOTAL (Right) Patient reports pain as mild.   Patient seen in rounds for Dr. Bernard Brick. Patient is well, and has had no acute complaints or problems. No acute events overnight. She ambulated a few feet yesterday. She felt the oxycodone  made her too tired, so we will plan to stay with tramadol . We will continue therapy today.   Objective: Vital signs in last 24 hours: Temp:  [97.4 F (36.3 C)-97.8 F (36.6 C)] 97.5 F (36.4 C) (05/15 0530) Pulse Rate:  [61-64] 63 (05/15 0530) Resp:  [15-17] 16 (05/15 0530) BP: (112-126)/(62-70) 126/63 (05/15 0530) SpO2:  [88 %-99 %] 98 % (05/15 0530)  Intake/Output from previous day:  Intake/Output Summary (Last 24 hours) at 06/15/2023 0733 Last data filed at 06/15/2023 0600 Gross per 24 hour  Intake 730 ml  Output --  Net 730 ml     Intake/Output this shift: No intake/output data recorded.  Labs: Recent Labs    06/14/23 0349 06/15/23 0356  HGB 12.4 11.1*   Recent Labs    06/14/23 0349 06/15/23 0356  WBC 10.7* 17.0*  RBC 3.88 3.44*  HCT 37.4 33.8*  PLT 180 178   Recent Labs    06/14/23 0349  NA 139  K 4.2  CL 107  CO2 24  BUN 15  CREATININE 0.67  GLUCOSE 230*  CALCIUM  9.9   No results for input(s): "LABPT", "INR" in the last 72 hours.  Exam: General - Patient is Alert and Oriented Extremity - Neurologically intact Sensation intact distally Intact pulses distally Dorsiflexion/Plantar flexion intact Dressing - dressing C/D/I Motor Function - intact, moving foot and toes well on exam.   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 Abel Hoe & Dr. Alva Jewels   Severe aortic stenosis    Type 2 diabetes mellitus without complication, without long-term current use of insulin  (HCC) 04/10/2018   Vitamin D  deficiency     Assessment/Plan: 2 Days Post-Op Procedure(s) (LRB): ARTHROPLASTY, KNEE, TOTAL (Right) Principal Problem:   S/P total knee arthroplasty, right  Estimated body mass index is 29.65 kg/m as calculated from the following:   Height as of this encounter: 5\' 8"  (1.727 m).   Weight as of this encounter: 88.5 kg. Advance diet Up with therapy D/C IV fluids    DVT Prophylaxis - Eliquis  Weight bearing as tolerated.  We will d/c oxycodone  and use tramadol    I had a long discussion with patient this morning about  her home situation and plans  Her daughter called me yesterday with concerns that she works during the day and patient would be alone. I talked to patient who tells me she has her home set up for things with a bedside commode nearby and feels confident she can do this well. She tells me she has several grand kids and a close friend who are going to help her. She declines HHPT for now as she prefers to have the equipment at [facility].   I told her that she will need to progress with PT today to go home, but she would like to leave today if possible  Kim Pen, PA-C Orthopedic Surgery [phone] 06/15/2023, 7:33 AM

## 2023-06-15 NOTE — Plan of Care (Signed)

## 2023-06-16 ENCOUNTER — Other Ambulatory Visit (HOSPITAL_COMMUNITY): Payer: Self-pay

## 2023-06-16 DIAGNOSIS — Z952 Presence of prosthetic heart valve: Secondary | ICD-10-CM | POA: Diagnosis not present

## 2023-06-16 DIAGNOSIS — Z7901 Long term (current) use of anticoagulants: Secondary | ICD-10-CM | POA: Diagnosis not present

## 2023-06-16 DIAGNOSIS — I130 Hypertensive heart and chronic kidney disease with heart failure and stage 1 through stage 4 chronic kidney disease, or unspecified chronic kidney disease: Secondary | ICD-10-CM | POA: Diagnosis present

## 2023-06-16 DIAGNOSIS — E559 Vitamin D deficiency, unspecified: Secondary | ICD-10-CM | POA: Diagnosis present

## 2023-06-16 DIAGNOSIS — I5032 Chronic diastolic (congestive) heart failure: Secondary | ICD-10-CM | POA: Diagnosis present

## 2023-06-16 DIAGNOSIS — M65961 Unspecified synovitis and tenosynovitis, right lower leg: Secondary | ICD-10-CM | POA: Diagnosis present

## 2023-06-16 DIAGNOSIS — I447 Left bundle-branch block, unspecified: Secondary | ICD-10-CM | POA: Diagnosis present

## 2023-06-16 DIAGNOSIS — E785 Hyperlipidemia, unspecified: Secondary | ICD-10-CM | POA: Diagnosis present

## 2023-06-16 DIAGNOSIS — J4489 Other specified chronic obstructive pulmonary disease: Secondary | ICD-10-CM | POA: Diagnosis present

## 2023-06-16 DIAGNOSIS — G2581 Restless legs syndrome: Secondary | ICD-10-CM | POA: Diagnosis present

## 2023-06-16 DIAGNOSIS — I4819 Other persistent atrial fibrillation: Secondary | ICD-10-CM | POA: Diagnosis present

## 2023-06-16 DIAGNOSIS — Z8249 Family history of ischemic heart disease and other diseases of the circulatory system: Secondary | ICD-10-CM | POA: Diagnosis not present

## 2023-06-16 DIAGNOSIS — Z7951 Long term (current) use of inhaled steroids: Secondary | ICD-10-CM | POA: Diagnosis not present

## 2023-06-16 DIAGNOSIS — Z7984 Long term (current) use of oral hypoglycemic drugs: Secondary | ICD-10-CM | POA: Diagnosis not present

## 2023-06-16 DIAGNOSIS — Z87442 Personal history of urinary calculi: Secondary | ICD-10-CM | POA: Diagnosis not present

## 2023-06-16 DIAGNOSIS — Z79899 Other long term (current) drug therapy: Secondary | ICD-10-CM | POA: Diagnosis not present

## 2023-06-16 DIAGNOSIS — M1711 Unilateral primary osteoarthritis, right knee: Secondary | ICD-10-CM | POA: Diagnosis present

## 2023-06-16 DIAGNOSIS — Z950 Presence of cardiac pacemaker: Secondary | ICD-10-CM | POA: Diagnosis not present

## 2023-06-16 DIAGNOSIS — M069 Rheumatoid arthritis, unspecified: Secondary | ICD-10-CM | POA: Diagnosis present

## 2023-06-16 DIAGNOSIS — N1831 Chronic kidney disease, stage 3a: Secondary | ICD-10-CM | POA: Diagnosis present

## 2023-06-16 DIAGNOSIS — E1122 Type 2 diabetes mellitus with diabetic chronic kidney disease: Secondary | ICD-10-CM | POA: Diagnosis present

## 2023-06-16 DIAGNOSIS — I2510 Atherosclerotic heart disease of native coronary artery without angina pectoris: Secondary | ICD-10-CM | POA: Diagnosis present

## 2023-06-16 DIAGNOSIS — Z8616 Personal history of COVID-19: Secondary | ICD-10-CM | POA: Diagnosis not present

## 2023-06-16 DIAGNOSIS — Z6831 Body mass index (BMI) 31.0-31.9, adult: Secondary | ICD-10-CM | POA: Diagnosis not present

## 2023-06-16 LAB — GLUCOSE, CAPILLARY
Glucose-Capillary: 128 mg/dL — ABNORMAL HIGH (ref 70–99)
Glucose-Capillary: 123 mg/dL — ABNORMAL HIGH (ref 70–99)
Glucose-Capillary: 123 mg/dL — ABNORMAL HIGH (ref 70–99)
Glucose-Capillary: 126 mg/dL — ABNORMAL HIGH (ref 70–99)

## 2023-06-16 MED ORDER — TRAMADOL HCL 50 MG PO TABS
50.0000 mg | ORAL_TABLET | Freq: Four times a day (QID) | ORAL | 0 refills | Status: DC | PRN
Start: 2023-06-16 — End: 2023-06-21
  Filled 2023-06-16: qty 42, 6d supply, fill #0

## 2023-06-16 MED ORDER — HYDROMORPHONE HCL 1 MG/ML IJ SOLN
0.5000 mg | INTRAMUSCULAR | Status: DC | PRN
  Administered 2023-06-17 – 2023-06-20 (×5): 0.5 mg via INTRAVENOUS
  Filled 2023-06-16 (×5): qty 0.5

## 2023-06-16 NOTE — Plan of Care (Signed)
  Problem: Education: Goal: Knowledge of General Education information will improve Description: Including pain rating scale, medication(s)/side effects and non-pharmacologic comfort measures Outcome: Progressing   Problem: Health Behavior/Discharge Planning: Goal: Ability to manage health-related needs will improve Outcome: Progressing   Problem: Clinical Measurements: Goal: Ability to maintain clinical measurements within normal limits will improve Outcome: Progressing Goal: Will remain free from infection Outcome: Progressing Goal: Diagnostic test results will improve Outcome: Progressing Goal: Respiratory complications will improve Outcome: Progressing Goal: Cardiovascular complication will be avoided Outcome: Progressing   Problem: Activity: Goal: Risk for activity intolerance will decrease Outcome: Progressing   Problem: Nutrition: Goal: Adequate nutrition will be maintained Outcome: Progressing   Problem: Coping: Goal: Level of anxiety will decrease Outcome: Progressing   Problem: Elimination: Goal: Will not experience complications related to bowel motility Outcome: Progressing Goal: Will not experience complications related to urinary retention Outcome: Progressing   Problem: Pain Managment: Goal: General experience of comfort will improve and/or be controlled Outcome: Progressing   Problem: Safety: Goal: Ability to remain free from injury will improve Outcome: Progressing   Problem: Skin Integrity: Goal: Risk for impaired skin integrity will decrease Outcome: Progressing   Problem: Education: Goal: Ability to describe self-care measures that may prevent or decrease complications (Diabetes Survival Skills Education) will improve Outcome: Progressing Goal: Individualized Educational Video(s) Outcome: Progressing   Problem: Coping: Goal: Ability to adjust to condition or change in health will improve Outcome: Progressing   Problem: Fluid  Volume: Goal: Ability to maintain a balanced intake and output will improve Outcome: Progressing   Problem: Health Behavior/Discharge Planning: Goal: Ability to identify and utilize available resources and services will improve Outcome: Progressing Goal: Ability to manage health-related needs will improve Outcome: Progressing   Problem: Metabolic: Goal: Ability to maintain appropriate glucose levels will improve Outcome: Progressing   Problem: Nutritional: Goal: Maintenance of adequate nutrition will improve Outcome: Progressing Goal: Progress toward achieving an optimal weight will improve Outcome: Progressing   Problem: Skin Integrity: Goal: Risk for impaired skin integrity will decrease Outcome: Progressing   Problem: Tissue Perfusion: Goal: Adequacy of tissue perfusion will improve Outcome: Progressing   Problem: Education: Goal: Knowledge of the prescribed therapeutic regimen will improve Outcome: Progressing Goal: Individualized Educational Video(s) Outcome: Progressing   Problem: Activity: Goal: Ability to avoid complications of mobility impairment will improve Outcome: Progressing Goal: Range of joint motion will improve Outcome: Progressing   Problem: Clinical Measurements: Goal: Postoperative complications will be avoided or minimized Outcome: Progressing   Problem: Pain Management: Goal: Pain level will decrease with appropriate interventions Outcome: Progressing   Problem: Skin Integrity: Goal: Will show signs of wound healing Outcome: Progressing

## 2023-06-16 NOTE — Progress Notes (Addendum)
 Physical Therapy Treatment Patient Details Name: Briana Foster MRN: [id number] DOB: Jan 07, 1946 Today's Date: 06/16/2023   History of Present Illness Briana Foster is a 78 yo female s/p R TKA 5/13. 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 General transfer comment: Pt found to be incont in recliner. Pt very sleepy/groggy in recliner unable to recall if she ate lunch.  She did tell me a story about "some girl was trying to make me walk and I fell" without realizing that was me.  Pt did fall into recliner assisted.   This afternoon, attempted to asisst to [facility] however Pt was unable to rise with just one assist.  NT called to room.  Pt required + 2 Max Assist to rise and pivot incomplete 1/4 turn to [facility]  Pt unable to achieve full upright stance.  Also, uncontrolled "plop" sit.  Assisted off lower height BSC required even more + 2 assist then transfer back to recliner. Pt was unable to attempt amb due to lethargy/lack of coordination and inability to support her weight.      If plan is discharge home, recommend the following: A little help with bathing/dressing/bathroom;Assistance with cooking/housework;Assist for transportation;Help with stairs or ramp for entrance;A lot of help with walking and/or transfers   Can travel by private vehicle        Equipment Recommendations  Rolling walker (2 wheels)    Recommendations for Other Services       Precautions / Restrictions Precautions Precautions: Fall;Knee Precaution/Restrictions Comments: no pillow under knee Restrictions Weight Bearing Restrictions Per Provider Order: No RLE Weight Bearing Per Provider Order: Weight bearing as tolerated     Mobility  Bed Mobility Overal bed mobility: Needs Assistance Bed Mobility: Supine to Sit     Supine to sit: Min assist     General bed mobility comments: OOB in recliner     Transfers Overall transfer level: Needs assistance Equipment used: Rolling walker (2 wheels) Transfers: Sit to/from Stand Sit to Stand: Max assist, +2 physical assistance, +2 safety/equipment           General transfer comment: Pt found to be incont in reclier.  Attempted to asisst to [facility] however Pt was unable to rise with just one assist.  NT called to room.  Pt required + 2 Max Assist to rise and pivot incomplete 1/4 turn to [facility].  Pt unable to achieve full upright stance.  Also, uncontrolled "plop" sit.  Assisted off lower height BSC required even more + 2 assist then transfer back to recliner.    Ambulation/Gait               General Gait Details: unable due to lethargy   Stairs             Wheelchair Mobility     Tilt Bed    Modified Rankin (Stroke Patients Only)       Balance                                            Communication Communication Communication: No apparent difficulties  Cognition Arousal: Lethargic Behavior During Therapy: WFL for tasks assessed/performed   PT - Cognitive impairments: No apparent impairments  PT - Cognition Comments: Pt very sleepy/groggy in recliner unable to recall if she ate lunch.  She did tell me a story about "some girl was trying to make me walk and I fell" without realizing that was me. Following commands: Intact Following commands impaired: Follows one step commands inconsistently    Cueing Cueing Techniques: Verbal cues  Exercises      General Comments        Pertinent Vitals/Pain Pain Assessment Pain Assessment: 0-10 Pain Score: 5  Faces Pain Scale: Hurts worst Pain Location: 5/10 right knee Pain Descriptors / Indicators: Grimacing, Guarding, Operative site guarding Pain Intervention(s): Monitored during session, Premedicated before session, Repositioned, Ice applied    Home Living                          Prior Function             PT Goals (current goals can now be found in the care plan section) Progress towards PT goals: Progressing toward goals    Frequency    7X/week      PT Plan      Co-evaluation              AM-PAC PT "6 Clicks" Mobility   Outcome Measure  Help needed turning from your back to your side while in a flat bed without using bedrails?: A Lot Help needed moving from lying on your back to sitting on the side of a flat bed without using bedrails?: A Lot Help needed moving to and from a bed to a chair (including a wheelchair)?: A Lot Help needed standing up from a chair using your arms (e.g., wheelchair or bedside chair)?: A Lot Help needed to walk in hospital room?: A Lot Help needed climbing 3-5 steps with a railing? : Total 6 Click Score: 11    End of Session Equipment Utilized During Treatment: Gait belt Activity Tolerance: Patient limited by lethargy Patient left: in chair;with call bell/phone within reach;with chair alarm set Nurse Communication: Mobility status PT Visit Diagnosis: Other abnormalities of gait and mobility (R26.89);Muscle weakness (generalized) (M62.81);Pain Pain - Right/Left: Right Pain - part of body: Knee     Time: 1610-9604 PT Time Calculation (min) (ACUTE ONLY): 18 min  Charges:    $Therapeutic Activity: 8-22 mins PT General Charges $$ ACUTE PT VISIT: 1 Visit                     Bess Broody  PTA Acute  Rehabilitation Services Office M-F          [phone]

## 2023-06-16 NOTE — Plan of Care (Signed)
  Problem: Activity: Goal: Risk for activity intolerance will decrease Outcome: Progressing   Problem: Safety: Goal: Ability to remain free from injury will improve Outcome: Progressing   Problem: Pain Managment: Goal: General experience of comfort will improve and/or be controlled Outcome: Progressing   Problem: Skin Integrity: Goal: Risk for impaired skin integrity will decrease Outcome: Progressing

## 2023-06-16 NOTE — Progress Notes (Signed)
 Physical Therapy Treatment Patient Details Name: Briana Foster MRN: [id number] DOB: [birth year] Today's Date: 06/16/2023   History of Present Illness Briana Foster is a 78 yo female s/p R TKA 5/13. PMH: arthritis, back pain, CHF, CKF, HTN, afib, diabetes, s/p TAVR 06/30/2020, pacemaker 10/28/20    PT Comments  POD # 3 am session VERY LIMITED activity tolerance due to increased c/o R knee pain Assisted OOB was difficult.  General bed mobility comments: attempted to demonstrate and instruct Pt how to use belt to self guide LE off bed but Pt present with MAX pain and emotional/crying. General transfer comment: Pt required increased asisst due to increased c/o knee pain and inability to tolerate much weight bearing.  attempted forward gait however Pt struggled to even take a few steps.  Near fall while tryig to complete 1/4 pivot turn from bed to recliner.  Therapist recovered as Pt nearly missed edge of chair.  Total Asisst to scoot to center and back. Positioned to comfort and applied ICE.  Pt was given Tramadol  40 min prior to session.  Secure chat sent to RN re Pt's poor performance and pain level. Pt has NOT met her mobility goals to safely D/C to home today.  Will see Pt again this afternoon.    If plan is discharge home, recommend the following: A little help with bathing/dressing/bathroom;Assistance with cooking/housework;Assist for transportation;Help with stairs or ramp for entrance;A lot of help with walking and/or transfers   Can travel by private vehicle        Equipment Recommendations  Rolling walker (2 wheels)    Recommendations for Other Services       Precautions / Restrictions Precautions Precautions: Fall;Knee Precaution/Restrictions Comments: no pillow under knee Restrictions Weight Bearing Restrictions Per Provider Order: No RLE Weight Bearing Per Provider Order: Weight bearing as tolerated     Mobility  Bed Mobility Overal bed mobility: Needs Assistance Bed  Mobility: Supine to Sit     Supine to sit: Min assist     General bed mobility comments: attempted to demonstrate and instruct Pt how to use belt to self guide LE off bed but Pt present with MAX pain and emotional/crying.    Transfers Overall transfer level: Needs assistance Equipment used: Rolling walker (2 wheels) Transfers: Sit to/from Stand Sit to Stand: Mod assist, Max assist           General transfer comment: Pt required increased asisst due to increased c/o knee pain and inability to tolerate much weight bearing.  attempted forward gait however Pt struggled to even take a few steps.  Near fall while tryig to complete 1/4 pivot turn from bed to recliner.  Therapist recovered as Pt nearly missed edge of chair.  Total Asisst to scoot to center and back.    Ambulation/Gait               General Gait Details: unable to even take a few steps due to increased c/o pain.   Stairs             Wheelchair Mobility     Tilt Bed    Modified Rankin (Stroke Patients Only)       Balance                                            Communication Communication Communication: No apparent difficulties  Cognition  Behavior During Therapy: WFL for tasks assessed/performed   PT - Cognitive impairments: No apparent impairments                       PT - Cognition Comments: AxO x 3 pleasant Lady.  Emotional today.  "I can't move that leg today".  Increased c/o pain with activity.  Crying. Following commands: Intact Following commands impaired: Follows one step commands with increased time    Cueing Cueing Techniques: Verbal cues  Exercises      General Comments        Pertinent Vitals/Pain Pain Assessment Pain Assessment: Faces Pain Score: 10-Worst pain ever Faces Pain Scale: Hurts worst Pain Location: Pt reports knee pain 15/10 grimacing, near crying Pain Descriptors / Indicators: Grimacing, Guarding, Operative site  guarding Pain Intervention(s): Monitored during session, Premedicated before session, Repositioned, Patient requesting pain meds-RN notified, Ice applied    Home Living                          Prior Function            PT Goals (current goals can now be found in the care plan section) Progress towards PT goals: Progressing toward goals    Frequency    7X/week      PT Plan      Co-evaluation              AM-PAC PT "6 Clicks" Mobility   Outcome Measure  Help needed turning from your back to your side while in a flat bed without using bedrails?: A Lot Help needed moving from lying on your back to sitting on the side of a flat bed without using bedrails?: A Lot Help needed moving to and from a bed to a chair (including a wheelchair)?: A Lot Help needed standing up from a chair using your arms (e.g., wheelchair or bedside chair)?: A Lot Help needed to walk in hospital room?: A Lot Help needed climbing 3-5 steps with a railing? : Total 6 Click Score: 11    End of Session Equipment Utilized During Treatment: Gait belt Activity Tolerance: Patient limited by pain Patient left: in chair;with call bell/phone within reach;with chair alarm set Nurse Communication: Mobility status PT Visit Diagnosis: Other abnormalities of gait and mobility (R26.89);Muscle weakness (generalized) (M62.81);Pain Pain - Right/Left: Right     Time: 8295-6213 PT Time Calculation (min) (ACUTE ONLY): 25 min  Charges:    $Therapeutic Activity: 23-37 mins PT General Charges $$ ACUTE PT VISIT: 1 Visit                    Bess Broody  PTA Acute  Rehabilitation Services Office M-F          [phone]

## 2023-06-16 NOTE — Progress Notes (Signed)
 Subjective: 3 Days Post-Op Procedure(s) (LRB): ARTHROPLASTY, KNEE, TOTAL (Right) Patient reports pain as mild.   Patient seen in rounds with Dr. Bernard Brick. Patient is well, and has had no acute complaints or problems. No acute events overnight. She tells us  she slept great last night. She was able to do well with PT, but has not completed stair training yet.  We will continue therapy today.   Objective: Vital signs in last 24 hours: Temp:  [97.8 F (36.6 C)-97.9 F (36.6 C)] 97.9 F (36.6 C) (05/16 0550) Pulse Rate:  [59-65] 63 (05/16 0550) Resp:  [16-17] 17 (05/16 0550) BP: (125-148)/(63-96) 144/96 (05/16 0550) SpO2:  [89 %-100 %] 98 % (05/16 0550)  Intake/Output from previous day:  Intake/Output Summary (Last 24 hours) at 06/16/2023 0750 Last data filed at 06/16/2023 0600 Gross per 24 hour  Intake 1080 ml  Output --  Net 1080 ml     Intake/Output this shift: No intake/output data recorded.  Labs: Recent Labs    06/14/23 0349 06/15/23 0356  HGB 12.4 11.1*   Recent Labs    06/14/23 0349 06/15/23 0356  WBC 10.7* 17.0*  RBC 3.88 3.44*  HCT 37.4 33.8*  PLT 180 178   Recent Labs    06/14/23 0349  NA 139  K 4.2  CL 107  CO2 24  BUN 15  CREATININE 0.67  GLUCOSE 230*  CALCIUM  9.9   No results for input(s): "LABPT", "INR" in the last 72 hours.  Exam: General - Patient is Alert and Oriented Extremity - Neurologically intact Sensation intact distally Intact pulses distally Dorsiflexion/Plantar flexion intact Dressing - dressing C/D/I Motor Function - intact, moving foot and toes well on exam.   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 Abel Hoe & Dr. Alva Jewels   Severe aortic stenosis    Type 2 diabetes mellitus without complication, without long-term current use of insulin  (HCC) 04/10/2018   Vitamin D  deficiency     Assessment/Plan: 3 Days Post-Op Procedure(s) (LRB): ARTHROPLASTY, KNEE, TOTAL (Right) Principal Problem:   S/P total knee arthroplasty, right  Estimated body mass index is 29.65 kg/m as calculated from the following:   Height as of this encounter: 5\' 8"  (1.727 m).   Weight as of this encounter: 88.5 kg. Advance diet Up with therapy D/C IV fluids    DVT Prophylaxis - Eliquis  Weight bearing as tolerated.  Plan is to go Home after hospital stay. Plan for discharge today following 1-2 sessions of PT  as long as they are meeting their goals.   Patient is scheduled for OPPT. She had an appointment for today at 10 AM, but will obviously not make this and instead plan to follow up next week.    Follow up in the office in 2 weeks.   Kim Pen, PA-C Orthopedic Surgery [phone] 06/16/2023, 7:50 AM

## 2023-06-17 ENCOUNTER — Other Ambulatory Visit (HOSPITAL_COMMUNITY): Payer: Self-pay

## 2023-06-17 LAB — GLUCOSE, CAPILLARY
Glucose-Capillary: 90 mg/dL (ref 70–99)
Glucose-Capillary: 101 mg/dL — ABNORMAL HIGH (ref 70–99)
Glucose-Capillary: 111 mg/dL — ABNORMAL HIGH (ref 70–99)
Glucose-Capillary: 135 mg/dL — ABNORMAL HIGH (ref 70–99)

## 2023-06-17 NOTE — Progress Notes (Signed)
 Subjective: 4 Days Post-Op Procedure(s) (LRB): ARTHROPLASTY, KNEE, TOTAL (Right)  Patient reports pain as appropriately controlled. Denies any new numbness/tingling.   Objective:   VITALS:  Temp:  [97.4 F (36.3 C)-97.9 F (36.6 C)] 97.4 F (36.3 C) (05/17 0603) Pulse Rate:  [62-65] 65 (05/17 0603) Resp:  [16-18] 17 (05/17 0603) BP: (136-145)/(61-71) 136/71 (05/17 0603) SpO2:  [91 %-98 %] 91 % (05/17 0603)  Gen: AAOx3, NAD Comfortable at rest  Right Lower Extremity: Dressings c/d/i ADF/APF/EHL 5/5 SILT throughout DP, PT 2+ to palp CR < 2s    LABS Recent Labs    06/15/23 0356  HGB 11.1*  WBC 17.0*  PLT 178   No results for input(s): "NA", "K", "CL", "CO2", "BUN", "CREATININE", "GLUCOSE" in the last 72 hours. No results for input(s): "LABPT", "INR" in the last 72 hours.   Assessment/Plan: 4 Days Post-Op Procedure(s) (LRB): ARTHROPLASTY, KNEE, TOTAL (Right)  Advance diet Up with therapy D/C IV fluids     DVT Prophylaxis - Eliquis  Weight bearing as tolerated.   Plan is to go Home after hospital stay. Pt has been progressing slowly with PT. Plan for discharge tomorrow following 1-2 sessions of PT as long as they are meeting their goals. If she meets goals today, will discharge today.  Ali Ink 06/17/2023, 7:45 AM

## 2023-06-17 NOTE — Plan of Care (Signed)
   Problem: Education: Goal: Knowledge of General Education information will improve Description Including pain rating scale, medication(s)/side effects and non-pharmacologic comfort measures Outcome: Progressing   Problem: Health Behavior/Discharge Planning: Goal: Ability to manage health-related needs will improve Outcome: Progressing

## 2023-06-17 NOTE — Progress Notes (Signed)
 TOC meds in a secure bag delivered to inpt pharmacy by this RN.

## 2023-06-17 NOTE — Progress Notes (Signed)
 Physical Therapy Treatment Patient Details Name: Briana Foster MRN: [id number] DOB: December 16, 1945 Today's Date: 06/17/2023   History of Present Illness Briana Foster is a 78 yo female s/p R TKA 5/13. 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 She reports "12" level pain with activity. She reports near fall on yesterday when attempting to ambulate. On today, R knee seems as if it wants to buckling with WBing. Unable to safely attempt ambulation with +1 assist. Assisted pt back to bed after pt used BSC. Will have ortho tech bring up a KI to see if that offers some increased support and pain control with Wbing. Do not anticipate pt will d/c on today-poor pain control and not meeting PT goals.     If plan is discharge home, recommend the following: A lot of help with walking and/or transfers;A lot of help with bathing/dressing/bathroom;Assistance with cooking/housework;Assist for transportation;Help with stairs or ramp for entrance   Can travel by private vehicle        Equipment Recommendations  Rolling walker (2 wheels)    Recommendations for Other Services       Precautions / Restrictions Precautions Precautions: Fall;Knee Restrictions Weight Bearing Restrictions Per Provider Order: No RLE Weight Bearing Per Provider Order: Weight bearing as tolerated     Mobility  Bed Mobility Overal bed mobility: Needs Assistance Bed Mobility: Supine to Sit, Sit to Supine     Supine to sit: Contact guard Sit to supine: Min assist   General bed mobility comments: Assist for R LE. Increased time. Cues provided as needed.    Transfers Overall transfer level: Needs assistance Equipment used: Rolling walker (2 wheels) Transfers: Sit to/from Stand Sit to Stand: Mod assist, From elevated surface Stand pivot transfers: Min assist         General transfer comment: x 2-once from  elevated bed, once from [facility]. Cues for safety, technique, hand/LE placement. Increased time. Stand pivot with shuffle/sliding of feet-bsc brought up behind her for safety. R knee seems to want to buckle and pt c/o significant pain as well. Assist for toileting hygience, then shffling/sliding of feet to get back to EOB. Deferred ambulation for safety reasons/fall risk.    Ambulation/Gait                   Stairs             Wheelchair Mobility     Tilt Bed    Modified Rankin (Stroke Patients Only)       Balance Overall balance assessment: Needs assistance         Standing balance support: Reliant on assistive device for balance, During functional activity, Bilateral upper extremity supported Standing balance-Leahy Scale: Poor                              Communication Communication Communication: No apparent difficulties  Cognition Arousal: Alert Behavior During Therapy: WFL for tasks assessed/performed   PT - Cognitive impairments: No apparent impairments                         Following commands: Intact Following commands impaired: Only follows one step commands consistently    Cueing Cueing Techniques: Verbal cues  Exercises Total Joint Exercises Ankle Circles/Pumps: AROM, Both, 10 reps Quad Sets: AROM, 5 reps Heel Slides: AAROM,  Right, 5 reps Hip ABduction/ADduction: AAROM, 5 reps Straight Leg Raises: AAROM, Right, 5 reps    General Comments        Pertinent Vitals/Pain Pain Assessment Pain Assessment: 0-10 Pain Score: 10-Worst pain ever Pain Location: R knee with activity Pain Descriptors / Indicators: Grimacing, Operative site guarding, Sharp Pain Intervention(s): Limited activity within patient's tolerance, Monitored during session, Ice applied, Repositioned    Home Living                          Prior Function            PT Goals (current goals can now be found in the care plan section) Progress  towards PT goals: Progressing toward goals    Frequency    7X/week      PT Plan      Co-evaluation              AM-PAC PT "6 Clicks" Mobility   Outcome Measure  Help needed turning from your back to your side while in a flat bed without using bedrails?: A Little Help needed moving from lying on your back to sitting on the side of a flat bed without using bedrails?: A Little Help needed moving to and from a bed to a chair (including a wheelchair)?: A Lot Help needed standing up from a chair using your arms (e.g., wheelchair or bedside chair)?: A Lot Help needed to walk in hospital room?: Total Help needed climbing 3-5 steps with a railing? : Total 6 Click Score: 12    End of Session Equipment Utilized During Treatment: Gait belt Activity Tolerance: Patient limited by pain;Patient limited by fatigue Patient left: in bed;with call bell/phone within reach;with bed alarm set   PT Visit Diagnosis: Other abnormalities of gait and mobility (R26.89);Muscle weakness (generalized) (M62.81);Pain Pain - Right/Left: Right Pain - part of body: Knee     Time: 1191-4782 PT Time Calculation (min) (ACUTE ONLY): 35 min  Charges:    $Therapeutic Exercise: 8-22 mins $Therapeutic Activity: 8-22 mins PT General Charges $$ ACUTE PT VISIT: 1 Visit                         Tanda Falter, PT Acute Rehabilitation  Office: [phone]

## 2023-06-17 NOTE — Plan of Care (Signed)

## 2023-06-17 NOTE — Progress Notes (Signed)
 Orthopedic Tech Progress Note Patient Details:  Briana Foster April 14, 1945 [id number]  Ortho Devices Type of Ortho Device: Knee Immobilizer Ortho Device/Splint Location: Directed by PT to leave at bedside Ortho Device/Splint Interventions: Ordered   Post Interventions Patient Tolerated: Other (comment)  Herbie Loll 06/17/2023, 11:45 AM

## 2023-06-17 NOTE — Progress Notes (Signed)
 Physical Therapy Treatment Patient Details Name: Briana Foster MRN: [id number] DOB: May 29, 1945 Today's Date: 06/17/2023   History of Present Illness Briana Foster is a 78 yo female s/p R TKA 5/13. 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 Continues to report significant knee pain with mobility-10/10 during session this afternoon. Pt walked ~15 feet-distance limited by pain, fatigue. She is at risk for falls when mobilizing. Will continue to follow and progress activity as pt is able to safely tolerate.     If plan is discharge home, recommend the following: A lot of help with walking and/or transfers;A lot of help with bathing/dressing/bathroom;Assistance with cooking/housework;Assist for transportation;Help with stairs or ramp for entrance   Can travel by private vehicle        Equipment Recommendations  Rolling walker (2 wheels)    Recommendations for Other Services       Precautions / Restrictions Precautions Precautions: Fall;Knee Recall of Precautions/Restrictions: Intact Precaution/Restrictions Comments: no pillow under knee Required Braces or Orthoses: Knee Immobilizer - Right Knee Immobilizer - Right: On when out of bed or walking Restrictions Weight Bearing Restrictions Per Provider Order: No RLE Weight Bearing Per Provider Order: Weight bearing as tolerated     Mobility  Bed Mobility Overal bed mobility: Needs Assistance Bed Mobility: Supine to Sit     Supine to sit: Contact guard, HOB elevated, Used rails Sit to supine: Min assist   General bed mobility comments: Pt used gait belt to assist R LE off bed. Increased time.    Transfers Overall transfer level: Needs assistance Equipment used: Rolling walker (2 wheels) Transfers: Sit to/from Stand Sit to Stand: Mod assist, From elevated surface Stand pivot transfers: Min assist         General  transfer comment: Bed elevated quite a bit for pt to be able to stand. Assist to power up, stabilize, control descent. Cues for safety, technique, hand/LE placement.    Ambulation/Gait Ambulation/Gait assistance: Min assist, +2 safety/equipment Gait Distance (Feet): 15 Feet Assistive device: Rolling walker (2 wheels) Gait Pattern/deviations: Step-to pattern, Decreased stride length, Decreased weight shift to right, Decreased stance time - right, Antalgic       General Gait Details: Distance limited by pain, fatigue. KI offers support to keep R LE from buckling. Followed closely with recliner and used it to transport pt back to bedside. Fall risk.   Stairs             Wheelchair Mobility     Tilt Bed    Modified Rankin (Stroke Patients Only)       Balance Overall balance assessment: Needs assistance         Standing balance support: Reliant on assistive device for balance, During functional activity, Bilateral upper extremity supported Standing balance-Leahy Scale: Poor                              Communication Communication Communication: No apparent difficulties  Cognition Arousal: Alert Behavior During Therapy: WFL for tasks assessed/performed   PT - Cognitive impairments: No apparent impairments                         Following commands: Intact Following commands impaired: Only follows one step commands consistently    Cueing Cueing Techniques: Verbal cues  Exercises     General  Comments        Pertinent Vitals/Pain Pain Assessment Pain Assessment: 0-10 Pain Score: 10-Worst pain ever Pain Location: R knee with activity Pain Descriptors / Indicators: Grimacing, Operative site guarding, Sharp Pain Intervention(s): Limited activity within patient's tolerance, Monitored during session, Premedicated before session, Ice applied    Home Living                          Prior Function            PT Goals  (current goals can now be found in the care plan section) Progress towards PT goals: Progressing toward goals    Frequency    7X/week      PT Plan      Co-evaluation              AM-PAC PT "6 Clicks" Mobility   Outcome Measure  Help needed turning from your back to your side while in a flat bed without using bedrails?: A Little Help needed moving from lying on your back to sitting on the side of a flat bed without using bedrails?: A Little Help needed moving to and from a bed to a chair (including a wheelchair)?: A Lot Help needed standing up from a chair using your arms (e.g., wheelchair or bedside chair)?: A Lot Help needed to walk in hospital room?: A Lot Help needed climbing 3-5 steps with a railing? : Total 6 Click Score: 13    End of Session Equipment Utilized During Treatment: Gait belt;Right knee immobilizer Activity Tolerance: Patient limited by fatigue;Patient limited by pain Patient left: in chair;with call bell/phone within reach;with family/visitor present   PT Visit Diagnosis: Other abnormalities of gait and mobility (R26.89);Muscle weakness (generalized) (M62.81);Pain Pain - Right/Left: Right Pain - part of body: Knee     Time: 6295-2841 PT Time Calculation (min) (ACUTE ONLY): 28 min  Charges:    $Gait Training: 23-37 mins PT General Charges $$ ACUTE PT VISIT: 1 Visit                         Tanda Falter, PT Acute Rehabilitation  Office: [phone]

## 2023-06-18 LAB — GLUCOSE, CAPILLARY
Glucose-Capillary: 98 mg/dL (ref 70–99)
Glucose-Capillary: 102 mg/dL — ABNORMAL HIGH (ref 70–99)
Glucose-Capillary: 99 mg/dL (ref 70–99)
Glucose-Capillary: 75 mg/dL (ref 70–99)

## 2023-06-18 NOTE — Progress Notes (Signed)
   Subjective: 5 Days Post-Op Procedure(s) (LRB): ARTHROPLASTY, KNEE, TOTAL (Right) Patient reports pain as moderate.   Plan is to go Home after hospital stay.  Objective: Vital signs in last 24 hours: Temp:  [97.8 F (36.6 C)-98.6 F (37 C)] 98.6 F (37 C) (05/18 0523) Pulse Rate:  [63-64] 64 (05/18 0523) Resp:  [16-18] 16 (05/18 0523) BP: (127-157)/(63-64) 157/63 (05/18 0523) SpO2:  [92 %-99 %] 93 % (05/18 0523)  Intake/Output from previous day:  Intake/Output Summary (Last 24 hours) at 06/18/2023 0749 Last data filed at 06/18/2023 0600 Gross per 24 hour  Intake 600 ml  Output 650 ml  Net -50 ml    Intake/Output this shift: No intake/output data recorded.  Labs: No results for input(s): "HGB" in the last 72 hours. No results for input(s): "WBC", "RBC", "HCT", "PLT" in the last 72 hours. No results for input(s): "NA", "K", "CL", "CO2", "BUN", "CREATININE", "GLUCOSE", "CALCIUM " in the last 72 hours. No results for input(s): "LABPT", "INR" in the last 72 hours.  EXAM General - Patient is Alert, Appropriate, and Oriented Extremity - Neurologically intact Incision: dressing C/D/I Dressing/Incision - clean, dry, no drainage Motor Function - intact, moving foot and toes well on exam.   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 Abel Hoe & Dr. Alva Jewels   Severe aortic stenosis    Type 2 diabetes mellitus without complication, without long-term current use of insulin  (HCC) 04/10/2018   Vitamin D  deficiency     Assessment/Plan: 5 Days Post-Op Procedure(s) (LRB): ARTHROPLASTY, KNEE, TOTAL (Right) Principal Problem:   S/P total knee arthroplasty, right   Up with therapy Has not progressed well with therapy yet with pain being a limiting factor Pain is somewhat better today so hopefully she will do better Possible discharge if she meets goals with therapy  Briana Foster 06/18/2023, 7:49 AM

## 2023-06-18 NOTE — Progress Notes (Signed)
 Physical Therapy Treatment Patient Details Name: BRYAHNA LESKO MRN: [id number] DOB: Mar 12, 1945 Today's Date: 06/18/2023   History of Present Illness Yolette Slaven is a 78 yo female s/p R TKA 5/13. 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 Pt appears very drowsy-had difficulty keeping her eyes open during session. Unsteady with LOB x 1 while getting to EOB. Sat EOB for some time. Assisted pt with scooting up to [facility] before returning to supine. Will continue to progress activity as safely able. May need to consider ST SNF if pt is unable to progress with mobility.     If plan is discharge home, recommend the following: A lot of help with walking and/or transfers;A lot of help with bathing/dressing/bathroom;Assistance with cooking/housework;Assist for transportation;Help with stairs or ramp for entrance   Can travel by private vehicle        Equipment Recommendations  Rolling walker (2 wheels)    Recommendations for Other Services       Precautions / Restrictions Precautions Precautions: Fall;Knee Precaution/Restrictions Comments: no pillow under knee Required Braces or Orthoses: Knee Immobilizer - Right Knee Immobilizer - Right: On when out of bed or walking Restrictions Weight Bearing Restrictions Per Provider Order: No RLE Weight Bearing Per Provider Order: Weight bearing as tolerated     Mobility  Bed Mobility Overal bed mobility: Needs Assistance Bed Mobility: Supine to Sit, Sit to Supine     Supine to sit: Min assist, HOB elevated, Used rails Sit to supine: Min assist   General bed mobility comments: Pt used gait belt to assist R LE off bed. Increased time. LOB laterally x 1 just before getting to EOB. Cues for safety throughout task.    Transfers                   General transfer comment: Deferred this am for safety reasons. Pt very sleepy and  unsteady with getting to EOB. High fall risk.    Ambulation/Gait                   Stairs             Wheelchair Mobility     Tilt Bed    Modified Rankin (Stroke Patients Only)       Balance Overall balance assessment: Needs assistance   Sitting balance-Leahy Scale: Poor                                      Communication    Cognition Arousal: Alert (but drowsy likely due to meds) Behavior During Therapy: WFL for tasks assessed/performed   PT - Cognitive impairments: No apparent impairments                       PT - Cognition Comments: sleepsy. difficulty staying awake during session Following commands: Intact Following commands impaired: Only follows one step commands consistently    Cueing Cueing Techniques: Verbal cues  Exercises Total Joint Exercises Ankle Circles/Pumps: AROM, Both, 10 reps Quad Sets: AROM, Both, 10 reps Heel Slides: AAROM, Right, 10 reps Hip ABduction/ADduction: AAROM, Right, 10 reps Straight Leg Raises: AAROM, Right, 10 reps    General Comments        Pertinent Vitals/Pain Pain Assessment Pain Assessment: Faces Faces Pain Scale: Hurts whole lot Pain Location:  R knee with activity Pain Descriptors / Indicators: Grimacing, Operative site guarding, Sharp Pain Intervention(s): Limited activity within patient's tolerance, Repositioned    Home Living                          Prior Function            PT Goals (current goals can now be found in the care plan section) Progress towards PT goals: Progressing toward goals    Frequency    7X/week      PT Plan      Co-evaluation              AM-PAC PT "6 Clicks" Mobility   Outcome Measure  Help needed turning from your back to your side while in a flat bed without using bedrails?: A Little Help needed moving from lying on your back to sitting on the side of a flat bed without using bedrails?: A Little Help needed moving  to and from a bed to a chair (including a wheelchair)?: A Lot Help needed standing up from a chair using your arms (e.g., wheelchair or bedside chair)?: A Lot Help needed to walk in hospital room?: A Lot Help needed climbing 3-5 steps with a railing? : Total 6 Click Score: 13    End of Session Equipment Utilized During Treatment: Gait belt;Right knee immobilizer Activity Tolerance: Patient limited by fatigue;Patient limited by pain Patient left: in bed;with call bell/phone within reach;with bed alarm set   PT Visit Diagnosis: Other abnormalities of gait and mobility (R26.89);Muscle weakness (generalized) (M62.81);Pain Pain - Right/Left: Right Pain - part of body: Knee     Time: 4098-1191 PT Time Calculation (min) (ACUTE ONLY): 26 min  Charges:    $Therapeutic Exercise: 8-22 mins $Therapeutic Activity: 8-22 mins PT General Charges $$ ACUTE PT VISIT: 1 Visit                         Tanda Falter, PT Acute Rehabilitation  Office: [phone]

## 2023-06-18 NOTE — Progress Notes (Signed)
 PT Cancellation Note  Patient Details Name: Briana Foster MRN: [id number] DOB: 21-Sep-1945   Cancelled Treatment:    Reason Eval/Treat Not Completed:  Attempted PT tx session-pt declined participation. Will check back on tomorrow.    Tanda Falter, PT Acute Rehabilitation  Office: [phone]

## 2023-06-18 NOTE — Plan of Care (Signed)
  Problem: Education: Goal: Knowledge of General Education information will improve Description: Including pain rating scale, medication(s)/side effects and non-pharmacologic comfort measures Outcome: Progressing   Problem: Health Behavior/Discharge Planning: Goal: Ability to manage health-related needs will improve Outcome: Progressing   Problem: Clinical Measurements: Goal: Ability to maintain clinical measurements within normal limits will improve Outcome: Progressing Goal: Will remain free from infection Outcome: Progressing Goal: Diagnostic test results will improve Outcome: Progressing Goal: Respiratory complications will improve Outcome: Progressing Goal: Cardiovascular complication will be avoided Outcome: Progressing   Problem: Activity: Goal: Risk for activity intolerance will decrease Outcome: Progressing   Problem: Nutrition: Goal: Adequate nutrition will be maintained Outcome: Progressing   Problem: Coping: Goal: Level of anxiety will decrease Outcome: Progressing   Problem: Elimination: Goal: Will not experience complications related to bowel motility Outcome: Progressing Goal: Will not experience complications related to urinary retention Outcome: Progressing   Problem: Pain Managment: Goal: General experience of comfort will improve and/or be controlled Outcome: Progressing   Problem: Safety: Goal: Ability to remain free from injury will improve Outcome: Progressing   Problem: Skin Integrity: Goal: Risk for impaired skin integrity will decrease Outcome: Progressing   Problem: Education: Goal: Ability to describe self-care measures that may prevent or decrease complications (Diabetes Survival Skills Education) will improve Outcome: Progressing Goal: Individualized Educational Video(s) Outcome: Progressing   Problem: Coping: Goal: Ability to adjust to condition or change in health will improve Outcome: Progressing   Problem: Fluid  Volume: Goal: Ability to maintain a balanced intake and output will improve Outcome: Progressing   Problem: Health Behavior/Discharge Planning: Goal: Ability to identify and utilize available resources and services will improve Outcome: Progressing Goal: Ability to manage health-related needs will improve Outcome: Progressing   Problem: Metabolic: Goal: Ability to maintain appropriate glucose levels will improve Outcome: Progressing   Problem: Nutritional: Goal: Maintenance of adequate nutrition will improve Outcome: Progressing Goal: Progress toward achieving an optimal weight will improve Outcome: Progressing   Problem: Skin Integrity: Goal: Risk for impaired skin integrity will decrease Outcome: Progressing   Problem: Tissue Perfusion: Goal: Adequacy of tissue perfusion will improve Outcome: Progressing   Problem: Education: Goal: Knowledge of the prescribed therapeutic regimen will improve Outcome: Progressing Goal: Individualized Educational Video(s) Outcome: Progressing   Problem: Activity: Goal: Ability to avoid complications of mobility impairment will improve Outcome: Progressing Goal: Range of joint motion will improve Outcome: Progressing   Problem: Clinical Measurements: Goal: Postoperative complications will be avoided or minimized Outcome: Progressing   Problem: Pain Management: Goal: Pain level will decrease with appropriate interventions Outcome: Progressing   Problem: Skin Integrity: Goal: Will show signs of wound healing Outcome: Progressing

## 2023-06-19 LAB — GLUCOSE, CAPILLARY
Glucose-Capillary: 111 mg/dL — ABNORMAL HIGH (ref 70–99)
Glucose-Capillary: 82 mg/dL (ref 70–99)
Glucose-Capillary: 103 mg/dL — ABNORMAL HIGH (ref 70–99)
Glucose-Capillary: 132 mg/dL — ABNORMAL HIGH (ref 70–99)

## 2023-06-19 MED ORDER — ACETAMINOPHEN 500 MG PO TABS
1000.0000 mg | ORAL_TABLET | Freq: Four times a day (QID) | ORAL | Status: DC
  Administered 2023-06-19 – 2023-06-21 (×8): 1000 mg via ORAL
  Filled 2023-06-19 (×8): qty 2

## 2023-06-19 MED ORDER — KETOROLAC TROMETHAMINE 15 MG/ML IJ SOLN
7.5000 mg | Freq: Four times a day (QID) | INTRAMUSCULAR | Status: AC
  Administered 2023-06-19 – 2023-06-20 (×4): 7.5 mg via INTRAVENOUS
  Filled 2023-06-19 (×4): qty 1

## 2023-06-19 NOTE — Progress Notes (Signed)
 Patient ID: Briana Foster, female   DOB: 1945-05-24, 78 y.o.   MRN: [id number] Subjective: 6 Days Post-Op Procedure(s) (LRB): ARTHROPLASTY, KNEE, TOTAL (Right)    Patient reports pain as moderate. Complains of soreness with mobility  Objective:   VITALS:   Vitals:   06/18/23 2117 06/19/23 0523  BP: (!) 148/66 (!) 154/77  Pulse: 63 63  Resp: 18 18  Temp: 97.7 F (36.5 C) 97.9 F (36.6 C)  SpO2: 97% 96%    Neurovascular intact Incision: dressing C/D/I - her dressing was peeling back a bit so I changed it out to a new [employer] She moves and bends her RLE fairly well in the bed Some ecchymosis right proximal thigh  LABS No results for input(s): "HGB", "HCT", "WBC", "PLT" in the last 72 hours.  No results for input(s): "NA", "K", "BUN", "CREATININE", "GLUCOSE" in the last 72 hours.  No results for input(s): "LABPT", "INR" in the last 72 hours.   Assessment/Plan: 6 Days Post-Op Procedure(s) (LRB): ARTHROPLASTY, KNEE, TOTAL (Right)   Up with therapy She feels she will able to do more today Reviewed short term goals She has been here long enough that this is basically been like a ST SNF stay Goal is for safe home discharge

## 2023-06-19 NOTE — Plan of Care (Signed)
  Problem: Education: Goal: Knowledge of General Education information will improve Description: Including pain rating scale, medication(s)/side effects and non-pharmacologic comfort measures Outcome: Progressing   Problem: Health Behavior/Discharge Planning: Goal: Ability to manage health-related needs will improve Outcome: Progressing   Problem: Clinical Measurements: Goal: Ability to maintain clinical measurements within normal limits will improve Outcome: Progressing Goal: Will remain free from infection Outcome: Progressing Goal: Diagnostic test results will improve Outcome: Progressing Goal: Respiratory complications will improve Outcome: Progressing Goal: Cardiovascular complication will be avoided Outcome: Progressing   Problem: Activity: Goal: Risk for activity intolerance will decrease Outcome: Progressing   Problem: Nutrition: Goal: Adequate nutrition will be maintained Outcome: Progressing   Problem: Coping: Goal: Level of anxiety will decrease Outcome: Progressing   Problem: Elimination: Goal: Will not experience complications related to bowel motility Outcome: Progressing Goal: Will not experience complications related to urinary retention Outcome: Progressing   Problem: Pain Managment: Goal: General experience of comfort will improve and/or be controlled Outcome: Progressing   Problem: Safety: Goal: Ability to remain free from injury will improve Outcome: Progressing   Problem: Skin Integrity: Goal: Risk for impaired skin integrity will decrease Outcome: Progressing   Problem: Education: Goal: Ability to describe self-care measures that may prevent or decrease complications (Diabetes Survival Skills Education) will improve Outcome: Progressing Goal: Individualized Educational Video(s) Outcome: Progressing   Problem: Coping: Goal: Ability to adjust to condition or change in health will improve Outcome: Progressing   Problem: Fluid  Volume: Goal: Ability to maintain a balanced intake and output will improve Outcome: Progressing   Problem: Health Behavior/Discharge Planning: Goal: Ability to identify and utilize available resources and services will improve Outcome: Progressing Goal: Ability to manage health-related needs will improve Outcome: Progressing   Problem: Metabolic: Goal: Ability to maintain appropriate glucose levels will improve Outcome: Progressing   Problem: Nutritional: Goal: Maintenance of adequate nutrition will improve Outcome: Progressing Goal: Progress toward achieving an optimal weight will improve Outcome: Progressing   Problem: Skin Integrity: Goal: Risk for impaired skin integrity will decrease Outcome: Progressing   Problem: Tissue Perfusion: Goal: Adequacy of tissue perfusion will improve Outcome: Progressing   Problem: Education: Goal: Knowledge of the prescribed therapeutic regimen will improve Outcome: Progressing Goal: Individualized Educational Video(s) Outcome: Progressing   Problem: Activity: Goal: Ability to avoid complications of mobility impairment will improve Outcome: Progressing Goal: Range of joint motion will improve Outcome: Progressing   Problem: Clinical Measurements: Goal: Postoperative complications will be avoided or minimized Outcome: Progressing   Problem: Pain Management: Goal: Pain level will decrease with appropriate interventions Outcome: Progressing   Problem: Skin Integrity: Goal: Will show signs of wound healing Outcome: Progressing

## 2023-06-19 NOTE — Progress Notes (Signed)
 Physical Therapy Treatment Patient Details Name: Briana Foster MRN: [id number] DOB: [birth year] Today's Date: 06/19/2023   History of Present Illness Briana Foster is a 78 yo female s/p R TKA 5/13. PMH: arthritis, back pain, CHF, CKF, HTN, afib, diabetes, s/p TAVR 06/30/2020, pacemaker 10/28/20    PT Comments  Pt appears confused, reports she thinks she ate lunch but no tray present upon arrival and nursing reports no lunch tray delivered to this room. Pt also with varying pain reports minutes apart. Grandson reports pt doesn't appear as normal, but more clear than previous day. Pt needing +2 to power to stand, slow to rise, limited RLE weightbearing tolerance. Pt amb 3 ft with RW, small R foot step forward with 2 small hops on L foot to meet R. Educated pt on smaller R foot step progression, BUE assisting on RW to progress L foot and maintaining RW at further distance to improve safety. Pt needing close recliner follow for safety, no knee buckling noted but significantly decreased RLE stance time and weigthbearing tolerance. Pt engages in quad sets and ankle pumps while up in recliner, reporting 10/10 pain, holding breath, needing cues for muscle activation and technique. Progress has been limited due to pain, grandson in room also concerned regarding pain medication not helping pain along with making pt tired. Pt has 5-6 steps to enter home and is unable to tolerate RLE stance/weightbearing long enough to advance L foot but ~2 inches on level ground. Notified RN and MD of ongoing pain limiting therapy.   If plan is discharge home, recommend the following: A lot of help with walking and/or transfers;A lot of help with bathing/dressing/bathroom;Assistance with cooking/housework;Assist for transportation;Help with stairs or ramp for entrance   Can travel by private vehicle        Equipment Recommendations  Rolling walker (2 wheels)    Recommendations for Other Services       Precautions /  Restrictions Precautions Precautions: Fall;Knee Recall of Precautions/Restrictions: Intact Precaution/Restrictions Comments: no pillow under knee Required Braces or Orthoses: Knee Immobilizer - Right Restrictions Weight Bearing Restrictions Per Provider Order: No RLE Weight Bearing Per Provider Order: Weight bearing as tolerated     Mobility  Bed Mobility Overal bed mobility: Needs Assistance Bed Mobility: Supine to Sit     Supine to sit: Min assist, HOB elevated, Used rails Sit to supine: Min assist   General bed mobility comments: min A to assist RLE to EOB, increased time and effort    Transfers Overall transfer level: Needs assistance Equipment used: Rolling walker (2 wheels) Transfers: Sit to/from Stand Sit to Stand: Mod assist, +2 physical assistance, +2 safety/equipment           General transfer comment: mod A+2 to power up from elevated bed, KI on RLE for comfort, pt needing increased assist to rise and shift weight forward    Ambulation/Gait Ambulation/Gait assistance: Min assist, +2 safety/equipment Gait Distance (Feet): 3 Feet Assistive device: Rolling walker (2 wheels) Gait Pattern/deviations: Step-to pattern, Decreased stride length, Decreased weight shift to right, Decreased stance time - right, Antalgic Gait velocity: decreased     General Gait Details: pt amb 3 ft, very short R step progression with L foot needing 2 small hops to catch up due to limited RLE weightbearing and stance time, antalgic, very close recliner follor for safety, no knee buckling noted but significantly decreased weightbearing tolerance on R leg increasing risk for falls, R KI on for comfort   Stairs  Wheelchair Mobility     Tilt Bed    Modified Rankin (Stroke Patients Only)       Balance Overall balance assessment: Needs assistance Sitting-balance support: Feet supported Sitting balance-Leahy Scale: Fair     Standing balance support: Reliant on  assistive device for balance, During functional activity, Bilateral upper extremity supported Standing balance-Leahy Scale: Poor                              Communication Communication Communication: No apparent difficulties  Cognition Arousal: Alert Behavior During Therapy: WFL for tasks assessed/performed   PT - Cognitive impairments: No apparent impairments                       PT - Cognition Comments: initiall sleepy with difficulty staying awake, appears slightly confused with varying reports of pain 8/10 then 10/10 minutes later, reports she "thinks" she ate lunch but no food tray present and nursing never saw tray delivered; grandson present assisting pt to order lunch at [employer] Following commands: Intact      Cueing Cueing Techniques: Verbal cues  Exercises Total Joint Exercises Ankle Circles/Pumps: AROM, Both, 10 reps, Seated Quad Sets: AROM, Right, 10 reps, Seated Heel Slides: AROM, Right, 5 reps, Supine, Seated (5 reps in supine, 5 reps in sitting) Long Arc Quad: AROM, Right, 5 reps, Seated    General Comments        Pertinent Vitals/Pain Pain Assessment Pain Assessment: 0-10 Pain Score:  (5/10, 8/10, and 10/10 all reported during session) Pain Location: R knee with activity Pain Descriptors / Indicators: Grimacing, Sore, Guarding Pain Intervention(s): Limited activity within patient's tolerance, Monitored during session, Repositioned, RN gave pain meds during session, Ice applied    Home Living                          Prior Function            PT Goals (current goals can now be found in the care plan section) Acute Rehab PT Goals Patient Stated Goal: return home with daughter to assist PT Goal Formulation: With patient Time For Goal Achievement: 06/28/23 Potential to Achieve Goals: Good Progress towards PT goals: Progressing toward goals (very slowly, limited by pain)    Frequency    7X/week      PT Plan       Co-evaluation              AM-PAC PT "6 Clicks" Mobility   Outcome Measure  Help needed turning from your back to your side while in a flat bed without using bedrails?: A Little Help needed moving from lying on your back to sitting on the side of a flat bed without using bedrails?: A Little Help needed moving to and from a bed to a chair (including a wheelchair)?: Total Help needed standing up from a chair using your arms (e.g., wheelchair or bedside chair)?: Total Help needed to walk in hospital room?: Total Help needed climbing 3-5 steps with a railing? : Total 6 Click Score: 10    End of Session Equipment Utilized During Treatment: Gait belt Activity Tolerance: Patient limited by pain Patient left: in chair;with call bell/phone within reach;with chair alarm set;with family/visitor present Nurse Communication: Mobility status (pain medication administered during session) PT Visit Diagnosis: Other abnormalities of gait and mobility (R26.89);Muscle weakness (generalized) (M62.81);Pain Pain - Right/Left: Right Pain - part  of body: Knee     Time: 6045-4098 PT Time Calculation (min) (ACUTE ONLY): 34 min  Charges:    $Therapeutic Exercise: 8-22 mins $Therapeutic Activity: 8-22 mins PT General Charges $$ ACUTE PT VISIT: 1 Visit                     Tori Kirstan Fentress PT, DPT 06/19/23, 1:43 PM

## 2023-06-19 NOTE — Progress Notes (Signed)
 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 Pt tolerates minimal exercises with increase in pain. Pt powers to stand with mod A from elevated bed, strong LLE and BUE assisting to rise. In standing, pt able to lift RLE ~2 inches off of floor. Pt unable to clear L foot from floor due to limited RLE weightbearing tolerance, heavy education regarding BUE assist on RW without improvement in performance. Pt unable to pivot to drop arm recliner at bedside, so assisted back to supine and placed ice. Pt verbalizes frustrations with physical limitations, reports minimal time OOB yesterday due to pain; heavy education regarding pain cycle, ice, elevation, time OOB to improve AROM and pain. Notified RN of pain limiting mobility.  Pt has good family support at home, but has to navigate 5-6 steps with bil handrail to enter the home according to family present 5/15. Will continue progressing mobility as able with goal to d/c home with family support.   If plan is discharge home, recommend the following: A lot of help with walking and/or transfers;A lot of help with bathing/dressing/bathroom;Assistance with cooking/housework;Assist for transportation;Help with stairs or ramp for entrance   Can travel by private vehicle        Equipment Recommendations  Rolling walker (2 wheels)    Recommendations for Other Services       Precautions / Restrictions Precautions Precautions: Fall;Knee Recall of Precautions/Restrictions: Intact Precaution/Restrictions Comments: no pillow under knee Restrictions Weight Bearing Restrictions Per Provider Order: No RLE Weight Bearing Per Provider Order:  Weight bearing as tolerated     Mobility  Bed Mobility Overal bed mobility: Needs Assistance Bed Mobility: Supine to Sit     Supine to sit: Contact guard, HOB elevated, Used rails Sit to supine: Min assist   General bed mobility comments: CGA for raising up to sitting, using gait belt to self assist RLE and bedrail to also assist self in uprighting into sitting, increased time and effort; min A to lift RLE back into bed and return to supine    Transfers Overall transfer level: Needs assistance Equipment used: Rolling walker (2 wheels) Transfers: Sit to/from Stand Sit to Stand: Mod assist, From elevated surface           General transfer comment: mod A to power up to standing from elevated bed, weight shift to L with RLE slightly forward, pt tolerates standing for ~2 minutes, able to clear R foot minimally (~2 inches) with static marching, unable to clear L foot in static marching despite BUE assisting on RW and WBAT RLE, pt unable to pivot to recliner at bedside    Ambulation/Gait                   Stairs             Wheelchair Mobility     Tilt Bed    Modified Rankin (Stroke Patients Only)       Balance Overall balance assessment: Needs assistance Sitting-balance support: Feet supported Sitting balance-Leahy Scale: Fair     Standing balance support: Reliant on assistive device for balance, During functional activity, Bilateral upper extremity supported Standing balance-Leahy Scale: Poor  Communication Communication Communication: No apparent difficulties  Cognition Arousal: Alert Behavior During Therapy: WFL for tasks assessed/performed   PT - Cognitive impairments: No apparent impairments                         Following commands: Intact      Cueing Cueing Techniques: Verbal cues  Exercises Total Joint Exercises Ankle Circles/Pumps: AROM, Both, 10 reps, PROM Quad Sets: AROM, Right,  5 reps Heel Slides: AROM, Right, 5 reps, Supine, Seated (5 reps in supine, 5 reps in sitting) Long Arc Quad: AROM, Right, 5 reps, Seated    General Comments        Pertinent Vitals/Pain      Home Living                          Prior Function            PT Goals (current goals can now be found in the care plan section) Progress towards PT goals: Progressing toward goals (very slowly, limited by pain)    Frequency    7X/week      PT Plan      Co-evaluation              AM-PAC PT "6 Clicks" Mobility   Outcome Measure  Help needed turning from your back to your side while in a flat bed without using bedrails?: A Little Help needed moving from lying on your back to sitting on the side of a flat bed without using bedrails?: A Little Help needed moving to and from a bed to a chair (including a wheelchair)?: A Lot Help needed standing up from a chair using your arms (e.g., wheelchair or bedside chair)?: A Lot Help needed to walk in hospital room?: A Lot Help needed climbing 3-5 steps with a railing? : Total 6 Click Score: 13    End of Session Equipment Utilized During Treatment: Gait belt Activity Tolerance: Patient limited by pain Patient left: in bed;with call bell/phone within reach;with bed alarm set Nurse Communication: Mobility status;Other (comment) (pain) PT Visit Diagnosis: Other abnormalities of gait and mobility (R26.89);Muscle weakness (generalized) (M62.81);Pain Pain - Right/Left: Right Pain - part of body: Knee     Time: 0901-0930 PT Time Calculation (min) (ACUTE ONLY): 29 min  Charges:    $Therapeutic Exercise: 8-22 mins $Therapeutic Activity: 8-22 mins PT General Charges $$ ACUTE PT VISIT: 1 Visit                     Tori Ohana Birdwell PT, DPT 06/19/23, 9:49 AM

## 2023-06-19 NOTE — Plan of Care (Signed)
   Problem: Activity: Goal: Risk for activity intolerance will decrease Outcome: Progressing   Problem: Nutrition: Goal: Adequate nutrition will be maintained Outcome: Progressing   Problem: Pain Managment: Goal: General experience of comfort will improve and/or be controlled Outcome: Progressing   Problem: Safety: Goal: Ability to remain free from injury will improve Outcome: Progressing

## 2023-06-20 LAB — GLUCOSE, CAPILLARY
Glucose-Capillary: 163 mg/dL — ABNORMAL HIGH (ref 70–99)
Glucose-Capillary: 121 mg/dL — ABNORMAL HIGH (ref 70–99)
Glucose-Capillary: 125 mg/dL — ABNORMAL HIGH (ref 70–99)
Glucose-Capillary: 164 mg/dL — ABNORMAL HIGH (ref 70–99)

## 2023-06-20 NOTE — Progress Notes (Signed)
 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 Hospitalization complicated by increased pain and slow progression with mobility.  Today, per ortho doing 4 doses of toradol  for pain control and can consider SNF if not progressing.   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  Good improvement in ambulation quality and quantity (60' with RW) . Tolerating ROM well with good quad activation.  Will continue to progress and try stairs this afternoon if able.     If plan is discharge home, recommend the following: A lot of help with walking and/or transfers;A lot of help with bathing/dressing/bathroom;Assistance with cooking/housework;Assist for transportation;Help with stairs or ramp for entrance   Can travel by private vehicle        Equipment Recommendations  Rolling walker (2 wheels)    Recommendations for Other Services       Precautions / Restrictions Precautions Precautions: Fall;Knee Restrictions RLE Weight Bearing Per Provider Order: Weight bearing as tolerated     Mobility  Bed Mobility               General bed mobility comments: in chair    Transfers Overall transfer level: Needs assistance Equipment used: Rolling walker (2 wheels) Transfers: Sit to/from Stand Sit to Stand: Contact guard assist           General transfer comment: Performed STS x 2 with CGA; good hand placement    Ambulation/Gait Ambulation/Gait assistance: Contact guard assist Gait Distance (Feet): 60 Feet Assistive device: Rolling walker (2 wheels) Gait Pattern/deviations: Step-to pattern, Decreased weight shift to right Gait velocity: decreased     General Gait  Details: Significant improvement today; step to gait with decreased weight shift to R but was able to ambulate 60' with RW at reasonable past post-op; Able to ambulate without KI and no buckling; tending to shuffle but could clear feet when cued   Stairs             Wheelchair Mobility     Tilt Bed    Modified Rankin (Stroke Patients Only)       Balance Overall balance assessment: Needs assistance Sitting-balance support: Feet supported Sitting balance-Leahy Scale: Good     Standing balance support: Reliant on assistive device for balance, During functional activity, Bilateral upper extremity supported Standing balance-Leahy Scale: Poor                              Communication    Cognition Arousal: Alert Behavior During Therapy: WFL for tasks assessed/performed   PT - Cognitive impairments: No apparent impairments                       PT - Cognition Comments: alert and reports feeling good today        Cueing    Exercises Total Joint Exercises Ankle Circles/Pumps: AROM, Both, 10 reps, Seated Quad Sets: AROM, Right, 10 reps, Seated Heel Slides: AROM, Right, 10 reps, Supine Long Arc Quad: AROM, Right, Seated, 10 reps Knee Flexion: AROM, Right, 10 reps, Seated Goniometric ROM: R knee ~5 to 80 degrees    General Comments  Pertinent Vitals/Pain Pain Assessment Pain Assessment: 0-10 Pain Score: 3  Pain Location: R knee Pain Descriptors / Indicators: Aching, Sore Pain Intervention(s): Limited activity within patient's tolerance, Monitored during session, Premedicated before session, Repositioned    Home Living                          Prior Function            PT Goals (current goals can now be found in the care plan section) Progress towards PT goals: Progressing toward goals    Frequency    7X/week      PT Plan      Co-evaluation              AM-PAC PT "6 Clicks" Mobility   Outcome  Measure  Help needed turning from your back to your side while in a flat bed without using bedrails?: A Little Help needed moving from lying on your back to sitting on the side of a flat bed without using bedrails?: A Little Help needed moving to and from a bed to a chair (including a wheelchair)?: A Little Help needed standing up from a chair using your arms (e.g., wheelchair or bedside chair)?: A Little Help needed to walk in hospital room?: A Little Help needed climbing 3-5 steps with a railing? : A Lot 6 Click Score: 17    End of Session Equipment Utilized During Treatment: Gait belt Activity Tolerance: Patient tolerated treatment well Patient left: in chair;with call bell/phone within reach;with chair alarm set Nurse Communication: Mobility status PT Visit Diagnosis: Other abnormalities of gait and mobility (R26.89);Muscle weakness (generalized) (M62.81);Pain Pain - Right/Left: Right Pain - part of body: Knee     Time: 1226-1240 PT Time Calculation (min) (ACUTE ONLY): 14 min  Charges:    $Gait Training: 8-22 mins PT General Charges $$ ACUTE PT VISIT: 1 Visit                     Cyd Dowse, PT Acute Rehab Services [facility] [phone]    Carolynn Citrin 06/20/2023, 2:17 PM

## 2023-06-20 NOTE — Progress Notes (Signed)
 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 Hospitalization complicated by increased pain and slow progression with mobility.  Today, per ortho doing 4 doses of toradol  for pain control and can consider SNF if not progressing.   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  She ambulated 40' with RW and CGA and performed 5 steps with bil rails and CGA.  Daughter did express concern that their steps are not in as good of shape and that pt would be alone during the day.  Do recommend that pt have someone with her for at least a week most of the time.  Will cont to progress as able.    If plan is discharge home, recommend the following: A lot of help with walking and/or transfers;A lot of help with bathing/dressing/bathroom;Assistance with cooking/housework;Assist for transportation;Help with stairs or ramp for entrance   Can travel by private vehicle        Equipment Recommendations  Rolling walker (2 wheels)    Recommendations for Other Services       Precautions / Restrictions Precautions Precautions: Fall;Knee Restrictions RLE Weight Bearing Per Provider Order: Weight bearing as tolerated     Mobility  Bed Mobility               General bed mobility comments: in chair    Transfers Overall transfer level: Needs assistance Equipment used: Rolling walker (2 wheels) Transfers: Sit to/from Stand Sit to Stand: Contact guard assist           General transfer comment: Performed STS x 2 with CGA; good hand placement    Ambulation/Gait Ambulation/Gait assistance: Contact guard assist Gait Distance (Feet): 70  Feet Assistive device: Rolling walker (2 wheels) Gait Pattern/deviations: Step-to pattern, Decreased weight shift to right Gait velocity: decreased     General Gait Details: Significant improvement today; step to gait with decreased weight shift to R but was able to ambulate 42' with RW at reasonable past post-op; Able to ambulate without KI and no buckling; tending to shuffle but could clear feet when cued   Stairs Stairs: Yes Stairs assistance: Contact guard assist, +2 safety/equipment Stair Management: Two rails, Step to pattern Number of Stairs: 5 General stair comments: Performed 5 steps with bil rails and CGA.  Heavy dependence on rails.  Pt has bil rails but daughter reports their "steps are not as nice."   Wheelchair Mobility     Tilt Bed    Modified Rankin (Stroke Patients Only)       Balance Overall balance assessment: Needs assistance Sitting-balance support: Feet supported Sitting balance-Leahy Scale: Good     Standing balance support: Reliant on assistive device for balance, During functional activity, Bilateral upper extremity supported Standing balance-Leahy Scale: Poor                              Communication    Cognition Arousal: Alert Behavior During Therapy: WFL for tasks assessed/performed   PT - Cognitive impairments: No apparent impairments  PT - Cognition Comments: alert and reports feeling good today        Cueing    Exercises Total Joint Exercises Ankle Circles/Pumps: AROM, Both, 10 reps, Seated Quad Sets: AROM, Right, 10 reps, Seated Heel Slides: AROM, Right, 10 reps, Supine Long Arc Quad: AROM, Right, Seated, 10 reps Knee Flexion: AROM, Right, 10 reps, Seated Goniometric ROM: R knee ~5 to 80 degrees    General Comments        Pertinent Vitals/Pain Pain Assessment Pain Assessment: 0-10 Pain Score: 3  Pain Location: R knee Pain Descriptors / Indicators: Aching, Sore Pain  Intervention(s): Limited activity within patient's tolerance, Monitored during session, Premedicated before session, Repositioned    Home Living                          Prior Function            PT Goals (current goals can now be found in the care plan section) Progress towards PT goals: Progressing toward goals    Frequency    7X/week      PT Plan      Co-evaluation              AM-PAC PT "6 Clicks" Mobility   Outcome Measure  Help needed turning from your back to your side while in a flat bed without using bedrails?: A Little Help needed moving from lying on your back to sitting on the side of a flat bed without using bedrails?: A Little Help needed moving to and from a bed to a chair (including a wheelchair)?: A Little Help needed standing up from a chair using your arms (e.g., wheelchair or bedside chair)?: A Little Help needed to walk in hospital room?: A Little Help needed climbing 3-5 steps with a railing? : A Little 6 Click Score: 18    End of Session Equipment Utilized During Treatment: Gait belt Activity Tolerance: Patient tolerated treatment well Patient left: in chair;with call bell/phone within reach;with chair alarm set Nurse Communication: Mobility status PT Visit Diagnosis: Other abnormalities of gait and mobility (R26.89);Muscle weakness (generalized) (M62.81);Pain Pain - Right/Left: Right Pain - part of body: Knee     Time: 7829-5621 PT Time Calculation (min) (ACUTE ONLY): 24 min  Charges:    $Gait Training: 8-22 mins $Therapeutic Exercise: 8-22 mins PT General Charges $$ ACUTE PT VISIT: 1 Visit                     Cyd Dowse, PT Acute Rehab [facility] [phone]    Carolynn Citrin 06/20/2023, 3:32 PM

## 2023-06-20 NOTE — Evaluation (Signed)
 Occupational Therapy Evaluation Patient Details Name: Briana Foster MRN: [id number] DOB: 1946-01-01 Today's Date: 06/20/2023   History of Present Illness   Briana Foster is a 78 yo female s/p R TKA 5/13. 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 Currently, patient presents with deficits outlined below (see OT Problem List for details) most significantly pain, decreased R LE ROM and strength for mobility and functional reach, decreased balance and activity tolerance impacting BADL's and functional mobility. OT initiated AE training and provided handout on obtaining "hip kit" if needed and was able to progress patient to access commode over toilet in bathroom this session via RW ambulation. OT will continue to work with patient to progress functional safety and performance to allow for discharge with support and assistance.      If plan is discharge home, recommend the following:   A lot of help with walking and/or transfers;A lot of help with bathing/dressing/bathroom;Assistance with cooking/housework;Direct supervision/assist for medications management;Direct supervision/assist for financial management;Assist for transportation;Help with stairs or ramp for entrance     Functional Status Assessment   Patient has had a recent decline in their functional status and demonstrates the ability to make significant improvements in function in a reasonable and predictable amount of time.     Equipment Recommendations   Other (comment) (provided with "hip kit" handout as needed)      Precautions/Restrictions   Precautions Precautions: Fall;Knee Recall of Precautions/Restrictions: Intact Precaution/Restrictions Comments: no pillow under knee Required Braces or Orthoses: Knee Immobilizer - Right Knee  Immobilizer - Right: On when out of bed or walking Restrictions Weight Bearing Restrictions Per Provider Order: No RLE Weight Bearing Per Provider Order: Weight bearing as tolerated     Mobility Bed Mobility Overal bed mobility:  (patient was already up in recliner and remained)                  Transfers Overall transfer level: Needs assistance Equipment used: Rolling walker (2 wheels) Transfers: Sit to/from Stand, Bed to chair/wheelchair/BSC Sit to Stand: Contact guard assist     Step pivot transfers: Contact guard assist, From elevated surface, Min assist     General transfer comment: significant improvement with ADL transfers STS and to bathroom with RW      Balance Overall balance assessment: Needs assistance Sitting-balance support: Feet supported Sitting balance-Leahy Scale: Good     Standing balance support: Reliant on assistive device for balance, During functional activity, Bilateral upper extremity supported Standing balance-Leahy Scale: Poor                             ADL either performed or assessed with clinical judgement   ADL Overall ADL's : Needs assistance/impaired Eating/Feeding: Set up;Sitting   Grooming: Set up;Sitting   Upper Body Bathing: Set up;Sitting   Lower Body Bathing: Moderate assistance;Sitting/lateral leans;Sit to/from stand Lower Body Bathing Details (indicate cue type and reason): patient has long handled shower sponge at home and was able to teach back use Upper Body Dressing : Set up;Sitting   Lower Body Dressing: Moderate assistance;Sitting/lateral leans;Sit to/from stand;With adaptive equipment Lower Body Dressing Details (indicate cue type and reason): initiated training with sock aide, reacher and LH shoe horn and provided with handout for "hip kit" purchase if needed Toilet Transfer: Contact guard assist;Ambulation;Rolling walker (  2 wheels) (placed commode over top of toilet)   Toileting- Clothing  Manipulation and Hygiene: Contact guard assist;Sit to/from stand       Functional mobility during ADLs: Contact guard assist;Rolling walker (2 wheels);Minimal assistance (was able to ambulate with KI on R LE to and from bathroom for use with commode over toilet) General ADL Comments: see above for AE trainign and integration     Vision Baseline Vision/History: 0 No visual deficits;1 Wears glasses Ability to See in Adequate Light: 0 Adequate Patient Visual Report: No change from baseline Vision Assessment?: No apparent visual deficits;Wears glasses for reading            Pertinent Vitals/Pain Pain Assessment Pain Assessment: 0-10 Pain Score: 5  Pain Location: R knee Pain Descriptors / Indicators: Aching, Sore Pain Intervention(s): Limited activity within patient's tolerance, Monitored during session, Premedicated before session, Ice applied     Extremity/Trunk Assessment Upper Extremity Assessment Upper Extremity Assessment: Right hand dominant;Overall WFL for tasks assessed   Lower Extremity Assessment Lower Extremity Assessment: Defer to PT evaluation RLE Deficits / Details: R post op dressing intact   Cervical / Trunk Assessment Cervical / Trunk Assessment: Normal   Communication Communication Communication: No apparent difficulties   Cognition Arousal: Alert Behavior During Therapy: WFL for tasks assessed/performed                                 Following commands: Intact Following commands impaired: Only follows one step commands consistently     Cueing  General Comments   Cueing Techniques: Verbal cues  RA with 98% SpO2 this session, ice on knee with post op R knee dressing intact           Home Living Family/patient expects to be discharged to:: Private residence Living Arrangements: Children Available Help at Discharge: Family;Available 24 hours/day Type of Home: House Home Access: Stairs to enter [employer] of Steps:  3 Entrance Stairs-Rails: Right;Left;Can reach both Home Layout: One level     Bathroom Shower/Tub: Walk-in shower;Door   [employer] Toilet: Standard Bathroom Accessibility: Yes How Accessible: Accessible via walker Home Equipment: Rollator (4 wheels);Cane - single point;Shower seat - built in;BSC/3in1          Prior Functioning/Environment Prior Level of Function : Independent/Modified Independent             Mobility Comments: pt reports using hurry-cane as needed ADLs Comments: pt reports ind    OT Problem List: Impaired balance (sitting and/or standing);Decreased knowledge of use of DME or AE;Decreased knowledge of precautions;Pain   OT Treatment/Interventions: Self-care/ADL training;Therapeutic exercise;Neuromuscular education;Energy conservation;DME and/or AE instruction;Therapeutic activities;Balance training;Patient/family education      OT Goals(Current goals can be found in the care plan section)   Acute Rehab OT Goals Patient Stated Goal: to go home as soon as I can OT Goal Formulation: With patient Time For Goal Achievement: 07/04/23 Potential to Achieve Goals: Good   OT Frequency:  Min 2X/week       AM-PAC OT "6 Clicks" Daily Activity     Outcome Measure Help from another person eating meals?: A Little Help from another person taking care of personal grooming?: A Little Help from another person toileting, which includes using toliet, bedpan, or urinal?: A Little Help from another person bathing (including washing, rinsing, drying)?: A Lot Help from another person to put on and taking off regular upper body clothing?: A Little Help from another person  to put on and taking off regular lower body clothing?: A Lot 6 Click Score: 16   End of Session Equipment Utilized During Treatment: Gait belt;Rolling walker (2 wheels);Other (comment);Right knee immobilizer Nurse Communication: Mobility status;Other (comment) (voiding entered in flowsheet)  Activity  Tolerance: Patient tolerated treatment well Patient left: in chair;with call bell/phone within reach;with chair alarm set  OT Visit Diagnosis: Unsteadiness on feet (R26.81);Pain Pain - Right/Left: Right Pain - part of body: Knee                Time: 5409-8119 OT Time Calculation (min): 40 min Charges:  OT General Charges $OT Visit: 1 Visit OT Evaluation $OT Eval Moderate Complexity: 1 Mod OT Treatments $Self Care/Home Management : 23-37 mins  Mehr Depaoli OT/L Acute Rehabilitation Department  [phone]  06/20/2023, 11:21 AM

## 2023-06-20 NOTE — Progress Notes (Signed)
   06/20/23 1640  Spiritual Encounters  Type of Visit Initial  Care provided to: Patient  Referral source Other (comment) (Spiritual Consult)  Reason for visit Advance directives  OnCall Visit No   Chaplain visited with the patient, Briana Foster, [name]) as friends call her.  I shared that I was responding to a request for advanced directive education. Dee told me she already had an AD, she attempted to find it but could not put her finger on it. I did not leave paperwork with her at this time. We visited for a bit, [name] is hoping to go home tomorrow and she is ready.    Clarence Croak [facility]  [phone]

## 2023-06-20 NOTE — Progress Notes (Signed)
 Patient ID: Briana Foster, female   DOB: [birth year], 78 y.o.   MRN: [id number] Subjective: 7 Days Post-Op Procedure(s) (LRB): ARTHROPLASTY, KNEE, TOTAL (Right)    Patient reports pain as moderate. Despite now being in the hospital for 1 week her mobility continues to be limited due to pain.  She does state that she does feel little bit better this morning.  She is eager to try working with physical therapy.  Objective:   VITALS:   Vitals:   06/19/23 2115 06/20/23 0543  BP: 127/76 (!) 142/76  Pulse: 64 63  Resp: 18 18  Temp: (!) 97.5 F (36.4 C) 97.6 F (36.4 C)  SpO2: 93% 96%    Neurovascular intact Incision: dressing C/D/I  LABS No results for input(s): "HGB", "HCT", "WBC", "PLT" in the last 72 hours.  No results for input(s): "NA", "K", "BUN", "CREATININE", "GLUCOSE" in the last 72 hours.  No results for input(s): "LABPT", "INR" in the last 72 hours.   Assessment/Plan: 7 Days Post-Op Procedure(s) (LRB): ARTHROPLASTY, KNEE, TOTAL (Right)   Up with therapy Based on her ongoing challenges with reports of pain despite concerns with confusion related to higher doses of narcotics I did elect to put her on Toradol  for 4 doses.  She does take Eliquis  chronically however I think the benefit of using a low-dose Toradol  may help diminish pain while minimizing any further confusion.  We can see if this helps with her mobility.  If not based on her duration of stay we will work on placement at this point.  I have concerns regarding short-term SNF placement in regards to activity mobility and complications. Further disposition will be pending based on physical therapy evaluation as well as working towards short-term SNF placement despite our best efforts.

## 2023-06-21 ENCOUNTER — Other Ambulatory Visit (HOSPITAL_COMMUNITY): Payer: Self-pay

## 2023-06-21 LAB — GLUCOSE, CAPILLARY
Glucose-Capillary: 122 mg/dL — ABNORMAL HIGH (ref 70–99)
Glucose-Capillary: 149 mg/dL — ABNORMAL HIGH (ref 70–99)

## 2023-06-21 MED ORDER — CELECOXIB 100 MG PO CAPS
100.0000 mg | ORAL_CAPSULE | Freq: Two times a day (BID) | ORAL | 0 refills | Status: DC
  Filled 2023-06-21: qty 60, 30d supply, fill #0

## 2023-06-21 MED ORDER — PANTOPRAZOLE SODIUM 20 MG PO TBEC
20.0000 mg | DELAYED_RELEASE_TABLET | Freq: Every day | ORAL | 0 refills | Status: DC
  Filled 2023-06-21: qty 30, 30d supply, fill #0

## 2023-06-21 MED ORDER — KETOROLAC TROMETHAMINE 15 MG/ML IJ SOLN
7.5000 mg | Freq: Four times a day (QID) | INTRAMUSCULAR | Status: AC
Start: 2023-06-21 — End: 2023-06-21
  Administered 2023-06-21 (×2): 7.5 mg via INTRAVENOUS
  Filled 2023-06-21 (×2): qty 1

## 2023-06-21 NOTE — Progress Notes (Signed)
 Occupational Therapy Treatment Patient Details Name: Briana Foster MRN: [id number] DOB: 20-Jun-1945 Today's Date: 06/21/2023   History of present illness Briana Foster is a 78 yo female s/p R TKA 5/13. Hospitalization complicated by increased pain and slow progression with mobility.  Today, per ortho doing 4 doses of toradol  for pain control and can consider SNF if not progressing.   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 Significant progress with BADL's and functional use of RW to access bathroom for toileting and sinkside morning routine. Consistent with use of ice and pain management now with R knee. Has awareness of use of AE for LB self care and handout provided to access if needed and family to assist upon discharge. Will continue to follow in Acute setting to progress function and safety.       If plan is discharge home, recommend the following:  A little help with walking and/or transfers;A little help with bathing/dressing/bathroom;Assistance with cooking/housework;Assist for transportation;Help with stairs or ramp for entrance   Equipment Recommendations     AE handout provided, otherwise all DME in home as per patient      Precautions / Restrictions Precautions Precautions: Fall;Knee Recall of Precautions/Restrictions: Intact Precaution/Restrictions Comments: no pillow under knee Required Braces or Orthoses: Knee Immobilizer - Right Knee Immobilizer - Right: On when out of bed or walking Restrictions Weight Bearing Restrictions Per Provider Order: No RLE Weight Bearing Per Provider Order: Weight bearing as tolerated       Mobility Bed Mobility Overal bed mobility: Modified Independent Bed Mobility: Sit to Supine     Supine to sit: Used rails, HOB elevated, Modified independent (Device/Increase time)          Transfers Overall transfer level: Needs  assistance Equipment used: Rolling walker (2 wheels) Transfers: Sit to/from Stand, Bed to chair/wheelchair/BSC Sit to Stand: Supervision, From elevated surface Stand pivot transfers: Supervision         General transfer comment: STS and amb to and from bathroom with Supervision this am and RW support     Balance Overall balance assessment: Needs assistance Sitting-balance support: Feet supported Sitting balance-Leahy Scale: Good     Standing balance support: During functional activity, Reliant on assistive device for balance, Bilateral upper extremity supported Standing balance-Leahy Scale: Fair                             ADL either performed or assessed with clinical judgement   ADL Overall ADL's : Needs assistance/impaired Eating/Feeding: Independent;Sitting   Grooming: Wash/dry hands;Wash/dry face;Oral care;Applying deodorant;Brushing hair;Independent;Sitting   Upper Body Bathing: Independent;Sitting   Lower Body Bathing: Minimal assistance;Sit to/from stand;Sitting/lateral leans   Upper Body Dressing : Modified independent;Sitting   Lower Body Dressing: Minimal assistance;With adaptive equipment;Sitting/lateral leans;Sit to/from stand   Toilet Transfer: Supervision/safety;Rolling walker (2 wheels);Comfort height toilet;Grab bars   Toileting- Clothing Manipulation and Hygiene: Supervision/safety;Sit to/from stand       Functional mobility during ADLs: Supervision/safety;Rolling walker (2 wheels) General ADL Comments: progressed full am self care with use if AE and RW    Extremity/Trunk Assessment Upper Extremity Assessment Upper Extremity Assessment: Overall WFL for tasks assessed   Lower Extremity Assessment Lower Extremity Assessment: Defer to PT evaluation RLE Deficits / Details: improved weight shifting in standing and amb with ADL's this am, post op R knee dressing intact  Cognition Arousal: Alert Behavior During Therapy: WFL for  tasks assessed/performed                                 Following commands: Intact        Cueing   Cueing Techniques: Verbal cues        General Comments VSS, low pain this am    Pertinent Vitals/ Pain       Pain Assessment Pain Assessment: 0-10 Pain Score: 4  Pain Location: R knee Pain Descriptors / Indicators: Sore Pain Intervention(s): Limited activity within patient's tolerance, Premedicated before session, Repositioned, Ice applied   Frequency  Min 2X/week        Progress Toward Goals  OT Goals(current goals can now be found in the care plan section)  Progress towards OT goals: Progressing toward goals  Acute Rehab OT Goals Patient Stated Goal: to keep walking better OT Goal Formulation: With patient Time For Goal Achievement: 07/04/23 Potential to Achieve Goals: Good  Plan      AM-PAC OT "6 Clicks" Daily Activity     Outcome Measure   Help from another person eating meals?: None Help from another person taking care of personal grooming?: None Help from another person toileting, which includes using toliet, bedpan, or urinal?: A Little Help from another person bathing (including washing, rinsing, drying)?: A Little Help from another person to put on and taking off regular upper body clothing?: None Help from another person to put on and taking off regular lower body clothing?: A Little 6 Click Score: 21    End of Session Equipment Utilized During Treatment: Gait belt;Rolling walker (2 wheels);Other (comment);Right knee immobilizer  OT Visit Diagnosis: Unsteadiness on feet (R26.81);Pain Pain - Right/Left: Right Pain - part of body: Knee   Activity Tolerance Patient tolerated treatment well   Patient Left in chair;with call bell/phone within reach;with chair alarm set   Nurse Communication Mobility status;Other (comment)        Time: 9563-8756 OT Time Calculation (min): 31 min  Charges: OT General Charges $OT Visit: 1 Visit OT  Treatments $Self Care/Home Management : 23-37 mins  Berton Butrick OT/L Acute Rehabilitation Department  [phone]  06/21/2023, 9:22 AM

## 2023-06-21 NOTE — Progress Notes (Signed)
 Subjective: 8 Days Post-Op Procedure(s) (LRB): ARTHROPLASTY, KNEE, TOTAL (Right) Patient reports pain as mild.   Patient seen in rounds with Dr. Bernard Brick. Patient is well, and has had no acute complaints or problems. No acute events overnight. She did very well with PT, and is sitting up at the edge of the bed today in good spirits.  We will start therapy today.   Objective: Vital signs in last 24 hours: Temp:  [97.5 F (36.4 C)-98.4 F (36.9 C)] 97.7 F (36.5 C) (05/21 0553) Pulse Rate:  [61-63] 61 (05/21 0553) Resp:  [15-17] 17 (05/21 0553) BP: (116-129)/(65-83) 129/65 (05/21 0553) SpO2:  [95 %-98 %] 98 % (05/21 0553)  Intake/Output from previous day:  Intake/Output Summary (Last 24 hours) at 06/21/2023 0733 Last data filed at 06/21/2023 0600 Gross per 24 hour  Intake 690 ml  Output --  Net 690 ml     Intake/Output this shift: No intake/output data recorded.  Labs: No results for input(s): "HGB" in the last 72 hours. No results for input(s): "WBC", "RBC", "HCT", "PLT" in the last 72 hours. No results for input(s): "NA", "K", "CL", "CO2", "BUN", "CREATININE", "GLUCOSE", "CALCIUM " in the last 72 hours. No results for input(s): "LABPT", "INR" in the last 72 hours.  Exam: General - Patient is Alert and Oriented Extremity - Neurologically intact Sensation intact distally Intact pulses distally Dorsiflexion/Plantar flexion intact Dressing - dressing C/D/I Motor Function - intact, moving foot and toes well on exam.   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 Abel Hoe & Dr. Alva Jewels   Severe aortic stenosis    Type 2 diabetes mellitus without complication, without long-term current use of insulin  (HCC) 04/10/2018   Vitamin D  deficiency     Assessment/Plan: 8 Days Post-Op Procedure(s) (LRB): ARTHROPLASTY, KNEE, TOTAL (Right) Principal Problem:   S/P total knee arthroplasty, right  Estimated body mass index is 29.65 kg/m as calculated from the following:   Height as of this encounter: 5\' 8"  (1.727 m).   Weight as of this encounter: 88.5 kg. Advance diet Up with therapy D/C IV fluids   DVT Prophylaxis - Eliquis  Weight bearing as tolerated.  She did much much better yesterday with no tramadol  and instead using an anti-inflammatory  We will plan to send her home on celebrex with protonix  short term to avoid use of opioids as she really does not tolerate these  We will set up HHPT for her, and she feels confidence her grandson can get  her into her home safely  Hopeful D/C today   Kim Pen, PA-C Orthopedic Surgery [phone] 06/21/2023, 7:33 AM

## 2023-06-21 NOTE — Plan of Care (Signed)
   Problem: Activity: Goal: Risk for activity intolerance will decrease Outcome: Progressing

## 2023-06-21 NOTE — Progress Notes (Addendum)
 Physical Therapy Treatment Patient Details Name: Briana Foster MRN: [id number] DOB: 02/10/1945 Today's Date: 06/21/2023   History of Present Illness Briana Foster is a 78 yo female s/p R TKA 5/13. Hospitalization complicated by increased pain and slow progression with mobility.  Today, per ortho doing 4 doses of toradol  for pain control and can consider SNF if not progressing.   PMH: arthritis, back pain, CHF, CKF, HTN, afib, diabetes, s/p TAVR 06/30/2020, pacemaker 10/28/20    PT Comments  Pt continues to make good progress.  Demonstrated stairs safely with grandson supervision. PA reports spoke with dtr who felt good about pt returning home with HHPT and current level of supervision available.  Pt demonstrates safe gait & transfers in order to return home from PT perspective once discharged by MD.  While in hospital, will continue to benefit from PT for skilled therapy to advance mobility and exercises.       If plan is discharge home, recommend the following: Assistance with cooking/housework;Assist for transportation;Help with stairs or ramp for entrance;A little help with walking and/or transfers;A little help with bathing/dressing/bathroom   Can travel by private vehicle        Equipment Recommendations  Rolling walker (2 wheels)    Recommendations for Other Services       Precautions / Restrictions Precautions Precautions: Fall;Knee Restrictions RLE Weight Bearing Per Provider Order: Weight bearing as tolerated     Mobility  Bed Mobility               General bed mobility comments: in chair    Transfers Overall transfer level: Needs assistance Equipment used: Rolling walker (2 wheels) Transfers: Sit to/from Stand Sit to Stand: Supervision           General transfer comment: Increased time to rise but no assist needed.  Good recall of hand placement    Ambulation/Gait Ambulation/Gait assistance: Contact guard assist Gait Distance (Feet): 80  Feet Assistive device: Rolling walker (2 wheels) Gait Pattern/deviations: Step-to pattern, Decreased weight shift to right Gait velocity: decreased     General Gait Details: Continued improvement today, ambulated 24' with RW at reasonable pace post op; able to ambulate without KI and no buckling, improved foot clearance, min cues for posture adn looking up   Stairs Stairs: Yes Stairs assistance: Contact guard assist Stair Management: Two rails, Step to pattern, Forwards Number of Stairs: 5 General stair comments: Provided cues and written cues earlier today on sequencing -pt recalled in afternoon; tolerated stairs well; educated grandson on guarding technique   Wheelchair Mobility     Tilt Bed    Modified Rankin (Stroke Patients Only)       Balance Overall balance assessment: Needs assistance Sitting-balance support: Feet supported Sitting balance-Leahy Scale: Good     Standing balance support: During functional activity, Reliant on assistive device for balance, Bilateral upper extremity supported, No upper extremity supported Standing balance-Leahy Scale: Fair Standing balance comment: RW to ambulate; could static stand wtihout support                            Communication    Cognition Arousal: Alert Behavior During Therapy: WFL for tasks assessed/performed   PT - Cognitive impairments: No apparent impairments                       PT - Cognition Comments: alert and reports feeling good today  Cueing    Exercises   General Comments    Pertinent Vitals/Pain Pain Assessment Pain Assessment: 0-10 Pain Score: 2  Pain Location: R knee Pain Descriptors / Indicators: Sore Pain Intervention(s): Limited activity within patient's tolerance, Monitored during session, Premedicated before session, Repositioned    Home Living                          Prior Function            PT Goals (current goals can now be found in  the care plan section) Progress towards PT goals: Progressing toward goals    Frequency    7X/week      PT Plan      Co-evaluation              AM-PAC PT "6 Clicks" Mobility   Outcome Measure  Help needed turning from your back to your side while in a flat bed without using bedrails?: A Little Help needed moving from lying on your back to sitting on the side of a flat bed without using bedrails?: A Little Help needed moving to and from a bed to a chair (including a wheelchair)?: A Little Help needed standing up from a chair using your arms (e.g., wheelchair or bedside chair)?: A Little Help needed to walk in hospital room?: A Little Help needed climbing 3-5 steps with a railing? : A Little 6 Click Score: 18    End of Session Equipment Utilized During Treatment: Gait belt Activity Tolerance: Patient tolerated treatment well Patient left: in chair;with call bell/phone within reach;with chair alarm set Nurse Communication: Mobility status PT Visit Diagnosis: Other abnormalities of gait and mobility (R26.89);Muscle weakness (generalized) (M62.81);Pain Pain - Right/Left: Right     Time: 1610-9604 PT Time Calculation (min) (ACUTE ONLY): 15 min  Charges:    $Gait Training: 8-22 mins PT General Charges $$ ACUTE PT VISIT: 1 Visit                     Cyd Dowse, PT Acute Rehab [facility] [phone]    Carolynn Citrin 06/21/2023, 2:53 PM

## 2023-06-21 NOTE — Progress Notes (Signed)
 Physical Therapy Treatment Patient Details Name: Briana Foster MRN: [id number] DOB: 04-27-1945 Today's Date: 06/21/2023   History of Present Illness Briana Foster is a 78 yo female s/p R TKA 5/13. Hospitalization complicated by increased pain and slow progression with mobility.  Today, per ortho doing 4 doses of toradol  for pain control and can consider SNF if not progressing.   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  She has excellent ROM, quad activation, and ROM.  Improved cognition - alert, oriented, follows commands.  Pt ambulating 53' with RW and CGA.  Plan to follow up in afternoon for stair training with family.     If plan is discharge home, recommend the following: Assistance with cooking/housework;Assist for transportation;Help with stairs or ramp for entrance;A little help with walking and/or transfers;A little help with bathing/dressing/bathroom   Can travel by private vehicle        Equipment Recommendations  Rolling walker (2 wheels)    Recommendations for Other Services       Precautions / Restrictions Precautions Precautions: Fall;Knee Restrictions RLE Weight Bearing Per Provider Order: Weight bearing as tolerated     Mobility  Bed Mobility               General bed mobility comments: in chair    Transfers Overall transfer level: Needs assistance Equipment used: Rolling walker (2 wheels) Transfers: Sit to/from Stand Sit to Stand: Supervision           General transfer comment: Increased time to rise but no assist needed.  Good recall of hand placement    Ambulation/Gait Ambulation/Gait assistance: Contact guard assist Gait Distance (Feet): 80 Feet Assistive device: Rolling walker (2 wheels) Gait Pattern/deviations: Step-to pattern, Decreased weight shift to right Gait velocity: decreased      General Gait Details: Continued improvement today, ambulated 52' with RW at reasonable pace post op; able to ambulate without KI and no buckling, improved foot clearance, min cues for posture   Stairs         General stair comments: plan to try in afternoon with grandson   Wheelchair Mobility     Tilt Bed    Modified Rankin (Stroke Patients Only)       Balance Overall balance assessment: Needs assistance Sitting-balance support: Feet supported Sitting balance-Leahy Scale: Good     Standing balance support: During functional activity, Reliant on assistive device for balance, Bilateral upper extremity supported, No upper extremity supported Standing balance-Leahy Scale: Fair Standing balance comment: RW to ambulate; could static stand wtihout support                            Communication    Cognition Arousal: Alert Behavior During Therapy: WFL for tasks assessed/performed   PT - Cognitive impairments: No apparent impairments                       PT - Cognition Comments: alert and reports feeling good today        Cueing    Exercises Total Joint Exercises Ankle Circles/Pumps: AROM, Both, 10 reps, Seated Quad Sets: AROM, Right, 10 reps, Seated Heel Slides: AROM, Right, 10 reps, Supine Hip ABduction/ADduction: AAROM, Right, 10 reps, Supine Long Arc Quad: AROM, Right, Seated, 10 reps Knee Flexion: AROM, Right, 10 reps, Seated Goniometric  ROM: R knee ~3 to 85 degrees Other Exercises Other Exercises: gait belt for AAROM; cues for toes up on hip abd    General Comments        Pertinent Vitals/Pain Pain Assessment Pain Assessment: 0-10 Pain Score: 3  Pain Location: R knee Pain Descriptors / Indicators: Sore Pain Intervention(s): Limited activity within patient's tolerance, Monitored during session, Premedicated before session, Repositioned    Home Living                          Prior Function            PT Goals  (current goals can now be found in the care plan section) Progress towards PT goals: Progressing toward goals    Frequency    7X/week      PT Plan      Co-evaluation              AM-PAC PT "6 Clicks" Mobility   Outcome Measure  Help needed turning from your back to your side while in a flat bed without using bedrails?: A Little Help needed moving from lying on your back to sitting on the side of a flat bed without using bedrails?: A Little Help needed moving to and from a bed to a chair (including a wheelchair)?: A Little Help needed standing up from a chair using your arms (e.g., wheelchair or bedside chair)?: A Little Help needed to walk in hospital room?: A Little Help needed climbing 3-5 steps with a railing? : A Little 6 Click Score: 18    End of Session Equipment Utilized During Treatment: Gait belt Activity Tolerance: Patient tolerated treatment well Patient left: in chair;with call bell/phone within reach;with chair alarm set Nurse Communication: Mobility status PT Visit Diagnosis: Other abnormalities of gait and mobility (R26.89);Muscle weakness (generalized) (M62.81);Pain Pain - Right/Left: Right     Time: 3875-6433 PT Time Calculation (min) (ACUTE ONLY): 24 min  Charges:    $Gait Training: 8-22 mins $Therapeutic Exercise: 8-22 mins PT General Charges $$ ACUTE PT VISIT: 1 Visit                     Cyd Dowse, PT Acute Rehab Services [facility] [phone]    Carolynn Citrin 06/21/2023, 1:27 PM

## 2023-06-21 NOTE — TOC Transition Note (Signed)
 Transition of Care [facility]) - Discharge Note   Patient Details  Name: Briana Foster MRN: [id number] Date of Birth: 1945/04/30  Transition of Care [facility]) CM/SW Contact:  Bari Leys, RN Phone Number: 06/21/2023, 10:35 AM   Clinical Narrative: DC to home. Order for [facility] PT, referral to [facility], rep-Cory, accepted for [facility] PT, added to AVS. No further TOC needs at this time.        Final next level of care: OP Rehab     Patient Goals and CMS Choice Patient states their goals for this hospitalization and ongoing recovery are:: return home          Discharge Placement                       Discharge Plan and Services Additional resources added to the After Visit Summary for                  DME Arranged: N/A DME Agency: NA                  Social Drivers of Health (SDOH) Interventions SDOH Screenings   Food Insecurity: No Food Insecurity (06/13/2023)  Housing: Low Risk  (06/13/2023)  Recent Concern: Housing - High Risk (04/13/2023)  Transportation Needs: No Transportation Needs (06/13/2023)  Utilities: Not At Risk (06/13/2023)  Recent Concern: Utilities - At Risk (04/13/2023)  Depression (PHQ2-9): Medium Risk (03/22/2018)  Social Connections: Moderately Integrated (06/13/2023)  Tobacco Use: Low Risk  (06/13/2023)     Readmission Risk Interventions    06/21/2023   10:35 AM 11/05/2021    3:55 PM  Readmission Risk Prevention Plan  Post Dischage Appt Complete Complete  Medication Screening Complete Complete  Transportation Screening Complete Complete

## 2023-06-21 NOTE — Progress Notes (Signed)
 Discharge package printed and instructions given to pt and grandson. Verbalize understanding.

## 2023-06-23 DIAGNOSIS — I447 Left bundle-branch block, unspecified: Secondary | ICD-10-CM | POA: Diagnosis not present

## 2023-06-23 DIAGNOSIS — I130 Hypertensive heart and chronic kidney disease with heart failure and stage 1 through stage 4 chronic kidney disease, or unspecified chronic kidney disease: Secondary | ICD-10-CM | POA: Diagnosis not present

## 2023-06-23 DIAGNOSIS — Z96651 Presence of right artificial knee joint: Secondary | ICD-10-CM | POA: Diagnosis not present

## 2023-06-23 DIAGNOSIS — I4819 Other persistent atrial fibrillation: Secondary | ICD-10-CM | POA: Diagnosis not present

## 2023-06-23 DIAGNOSIS — E1122 Type 2 diabetes mellitus with diabetic chronic kidney disease: Secondary | ICD-10-CM | POA: Diagnosis not present

## 2023-06-23 DIAGNOSIS — I5032 Chronic diastolic (congestive) heart failure: Secondary | ICD-10-CM | POA: Diagnosis not present

## 2023-06-23 DIAGNOSIS — Z471 Aftercare following joint replacement surgery: Secondary | ICD-10-CM | POA: Diagnosis not present

## 2023-06-23 DIAGNOSIS — I2510 Atherosclerotic heart disease of native coronary artery without angina pectoris: Secondary | ICD-10-CM | POA: Diagnosis not present

## 2023-06-23 DIAGNOSIS — N1831 Chronic kidney disease, stage 3a: Secondary | ICD-10-CM | POA: Diagnosis not present

## 2023-06-25 NOTE — Discharge Summary (Signed)
 Patient ID: Briana Foster MRN: [id number] DOB/AGE: 06/17/1945 78 y.o.  Admit date: 06/13/2023 Discharge date: 06/21/2023  Admission Diagnoses:  Right knee osteoarthritis  Discharge Diagnoses:  Principal Problem:   S/P total knee arthroplasty, right   Past Medical History:  Diagnosis Date   Anemia    Arthritis 07/20/2012   hands   Asthma    Back pain    Carotid arterial disease (HCC)    CHF (congestive heart failure) (HCC)    Chronic kidney disease    CKD 3a   Class 1 obesity with serious comorbidity and body mass index (BMI) of 31.0 to 31.9 in adult 03/26/2018   Coronary artery disease    Depression    Dysrhythmia    S. 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 Abel Hoe & Dr. Alva Jewels   Severe aortic stenosis    Type 2 diabetes mellitus without complication, without long-term current use of insulin  (HCC) 04/10/2018   Vitamin D  deficiency     Surgeries: Procedure(s): ARTHROPLASTY, KNEE, TOTAL on 06/13/2023   Consultants:   Discharged Condition: Improved  Hospital Course: Briana Foster is an 78 y.o. female who was admitted 06/13/2023 for operative treatment ofS/P total knee arthroplasty, right. Patient has severe unremitting pain that affects sleep, daily activities, and work/hobbies. After pre-op clearance the patient was taken to the operating room on 06/13/2023 and underwent  Procedure(s): ARTHROPLASTY, KNEE, TOTAL.    Patient was given perioperative antibiotics:  Anti-infectives (From admission, onward)    Start     Dose/Rate Route Frequency  Ordered Stop   06/13/23 2100  ceFAZolin  (ANCEF ) IVPB 2g/100 mL premix        2 g 200 mL/hr over 30 Minutes Intravenous Every 6 hours 06/13/23 1752 06/14/23 0859   06/13/23 1115  ceFAZolin  (ANCEF ) IVPB 2g/100 mL premix        2 g 200 mL/hr over 30 Minutes Intravenous On call to O.R. 06/13/23 1108 06/13/23 1455        Patient was given sequential compression devices, early ambulation, and chemoprophylaxis to prevent DVT. She had very slow progress with her mobility related to difficulty balancing pain management and sedation. She did not tolerate opioids in the end, so we transitioned to use of anti-inflammatories and she made excellent progress. Patient worked with PT and was meeting their goals regarding safe ambulation and transfers.  Patient benefited maximally from hospital stay and there were no complications.    Recent vital signs: No data found.   Recent laboratory studies: No results for input(s): "WBC", "HGB", "HCT", "PLT", "NA", "K", "CL", "CO2", "BUN", "CREATININE", "GLUCOSE", "INR", "CALCIUM " in the last 72 hours.  Invalid input(s): "PT", "2"   Discharge Medications:   Allergies as of 06/21/2023       Reactions   Bee Venom Shortness Of Breath, Rash   Claritin  [loratadine ] Itching, Palpitations   Wasp Venom Anaphylaxis   Apricot Flavoring Agent (non-screening) Swelling, Other (See Comments)   Eyes swell shut   Atorvastatin Itching, Swelling,  Other (See Comments)   Eye swelling and headaches   Losartan Itching, Rash        Medication List     STOP taking these medications    traMADol  50 MG tablet Commonly known as: ULTRAM        TAKE these medications    acetaminophen  500 MG tablet Commonly known as: TYLENOL  Take 500-1,000 mg by mouth every 8 (eight) hours as needed (for pain or headaches).   amLODipine  10 MG tablet Commonly known as: NORVASC  Take 10 mg by mouth daily.   apixaban  5 MG Tabs tablet Commonly known as: Eliquis  Take 1 tablet (5 mg total)  by mouth 2 (two) times daily.   celecoxib 100 MG capsule Commonly known as: CeleBREX Take 1 capsule (100 mg total) by mouth 2 (two) times daily. Take with food   Centrum Silver 50+Women Tabs Take 1 tablet by mouth daily with breakfast.   Dexcom G7 Sensor Misc Inject 1 Device into the skin See admin instructions. Place 1 new sensor into the skin every 10 days   glimepiride  2 MG tablet Commonly known as: AMARYL  Take 2 mg by mouth daily with breakfast.   Jardiance  10 MG Tabs tablet Generic drug: empagliflozin  Take 10 mg by mouth daily.   meclizine  25 MG tablet Commonly known as: ANTIVERT  Take 25 mg by mouth 2 (two) times daily as needed for dizziness.   memantine  5 MG tablet Commonly known as: NAMENDA  Take 5 mg by mouth in the morning.   metFORMIN  500 MG tablet Commonly known as: GLUCOPHAGE  Take 500 mg by mouth daily with breakfast.   methocarbamol  500 MG tablet Commonly known as: ROBAXIN  Take 1 tablet (500 mg total) by mouth every 6 (six) hours as needed for muscle spasms.   olmesartan  20 MG tablet Commonly known as: BENICAR  Take 20 mg by mouth daily.   pantoprazole  20 MG tablet Commonly known as: Protonix  Take 1 tablet (20 mg total) by mouth daily.   polyethylene glycol 17 g packet Commonly known as: MIRALAX  / GLYCOLAX  Take 17 g by mouth 2 (two) times daily.   rOPINIRole  4 MG tablet Commonly known as: REQUIP  Take 4 mg by mouth at bedtime. Take 4 mg by mouth at 8 PM   rosuvastatin  10 MG tablet Commonly known as: CRESTOR  Take 1 tablet (10 mg total) by mouth daily.   senna 8.6 MG Tabs tablet Commonly known as: SENOKOT Take 2 tablets (17.2 mg total) by mouth at bedtime for 14 days.   Symbicort  160-4.5 MCG/ACT inhaler Generic drug: budesonide -formoterol  Inhale 2 puffs into the lungs 2 (two) times daily. What changed:  when to take this reasons to take this   Ventolin  HFA 108 (90 Base) MCG/ACT inhaler Generic drug: albuterol  Inhale 2 puffs into the lungs  every 4 (four) hours as needed for wheezing or shortness of breath.        Diagnostic Studies: CUP PACEART REMOTE DEVICE CHECK Result Date: 05/29/2023 PPM scheduled remote reviewed. Normal device function.  Presenting rhythm: VP Next remote 91 days. AB, CVRS   Disposition: Discharge disposition: 01-Home or Self Care          Follow-up Information     Earnie Gola, PA-C Follow up on 06/28/2023.   Specialty: Orthopedic Surgery Why: You are scheduled for a post op appointment on 06/28/23 at 9:45am. Contact information: [address] [place] [address] [phone]         Towana Freshwater, P.A.. Go on 06/16/2023.   Why: You are  scheduled for physical therapy on 06/16/23 at 9:15am; [address] Contact information: [address] [place] [address] [phone]         [facility] Follow up.   Specialty: Home Health Services Why: Home Health Physical Therapy Contact information: [address] [place] [address] [phone]                  Signed: Earnie Gola 06/25/2023, 11:11 AM

## 2023-06-26 DIAGNOSIS — I2510 Atherosclerotic heart disease of native coronary artery without angina pectoris: Secondary | ICD-10-CM | POA: Diagnosis not present

## 2023-06-26 DIAGNOSIS — N1831 Chronic kidney disease, stage 3a: Secondary | ICD-10-CM | POA: Diagnosis not present

## 2023-06-26 DIAGNOSIS — Z96651 Presence of right artificial knee joint: Secondary | ICD-10-CM | POA: Diagnosis not present

## 2023-06-26 DIAGNOSIS — I447 Left bundle-branch block, unspecified: Secondary | ICD-10-CM | POA: Diagnosis not present

## 2023-06-26 DIAGNOSIS — I4819 Other persistent atrial fibrillation: Secondary | ICD-10-CM | POA: Diagnosis not present

## 2023-06-26 DIAGNOSIS — Z471 Aftercare following joint replacement surgery: Secondary | ICD-10-CM | POA: Diagnosis not present

## 2023-06-26 DIAGNOSIS — I5032 Chronic diastolic (congestive) heart failure: Secondary | ICD-10-CM | POA: Diagnosis not present

## 2023-06-26 DIAGNOSIS — I130 Hypertensive heart and chronic kidney disease with heart failure and stage 1 through stage 4 chronic kidney disease, or unspecified chronic kidney disease: Secondary | ICD-10-CM | POA: Diagnosis not present

## 2023-06-26 DIAGNOSIS — E1122 Type 2 diabetes mellitus with diabetic chronic kidney disease: Secondary | ICD-10-CM | POA: Diagnosis not present

## 2023-06-28 DIAGNOSIS — I130 Hypertensive heart and chronic kidney disease with heart failure and stage 1 through stage 4 chronic kidney disease, or unspecified chronic kidney disease: Secondary | ICD-10-CM | POA: Diagnosis not present

## 2023-06-28 DIAGNOSIS — Z96651 Presence of right artificial knee joint: Secondary | ICD-10-CM | POA: Diagnosis not present

## 2023-06-28 DIAGNOSIS — I2510 Atherosclerotic heart disease of native coronary artery without angina pectoris: Secondary | ICD-10-CM | POA: Diagnosis not present

## 2023-06-28 DIAGNOSIS — Z471 Aftercare following joint replacement surgery: Secondary | ICD-10-CM | POA: Diagnosis not present

## 2023-06-28 DIAGNOSIS — I4819 Other persistent atrial fibrillation: Secondary | ICD-10-CM | POA: Diagnosis not present

## 2023-06-28 DIAGNOSIS — N1831 Chronic kidney disease, stage 3a: Secondary | ICD-10-CM | POA: Diagnosis not present

## 2023-06-28 DIAGNOSIS — I5032 Chronic diastolic (congestive) heart failure: Secondary | ICD-10-CM | POA: Diagnosis not present

## 2023-06-28 DIAGNOSIS — I447 Left bundle-branch block, unspecified: Secondary | ICD-10-CM | POA: Diagnosis not present

## 2023-06-28 DIAGNOSIS — E1122 Type 2 diabetes mellitus with diabetic chronic kidney disease: Secondary | ICD-10-CM | POA: Diagnosis not present

## 2023-06-30 DIAGNOSIS — I5032 Chronic diastolic (congestive) heart failure: Secondary | ICD-10-CM | POA: Diagnosis not present

## 2023-06-30 DIAGNOSIS — N1831 Chronic kidney disease, stage 3a: Secondary | ICD-10-CM | POA: Diagnosis not present

## 2023-06-30 DIAGNOSIS — I2510 Atherosclerotic heart disease of native coronary artery without angina pectoris: Secondary | ICD-10-CM | POA: Diagnosis not present

## 2023-06-30 DIAGNOSIS — Z471 Aftercare following joint replacement surgery: Secondary | ICD-10-CM | POA: Diagnosis not present

## 2023-06-30 DIAGNOSIS — E1122 Type 2 diabetes mellitus with diabetic chronic kidney disease: Secondary | ICD-10-CM | POA: Diagnosis not present

## 2023-06-30 DIAGNOSIS — I447 Left bundle-branch block, unspecified: Secondary | ICD-10-CM | POA: Diagnosis not present

## 2023-06-30 DIAGNOSIS — Z96651 Presence of right artificial knee joint: Secondary | ICD-10-CM | POA: Diagnosis not present

## 2023-06-30 DIAGNOSIS — I4819 Other persistent atrial fibrillation: Secondary | ICD-10-CM | POA: Diagnosis not present

## 2023-06-30 DIAGNOSIS — I130 Hypertensive heart and chronic kidney disease with heart failure and stage 1 through stage 4 chronic kidney disease, or unspecified chronic kidney disease: Secondary | ICD-10-CM | POA: Diagnosis not present

## 2023-07-01 DIAGNOSIS — I4819 Other persistent atrial fibrillation: Secondary | ICD-10-CM | POA: Diagnosis not present

## 2023-07-01 DIAGNOSIS — I447 Left bundle-branch block, unspecified: Secondary | ICD-10-CM | POA: Diagnosis not present

## 2023-07-01 DIAGNOSIS — Z96651 Presence of right artificial knee joint: Secondary | ICD-10-CM | POA: Diagnosis not present

## 2023-07-01 DIAGNOSIS — E1122 Type 2 diabetes mellitus with diabetic chronic kidney disease: Secondary | ICD-10-CM | POA: Diagnosis not present

## 2023-07-01 DIAGNOSIS — I130 Hypertensive heart and chronic kidney disease with heart failure and stage 1 through stage 4 chronic kidney disease, or unspecified chronic kidney disease: Secondary | ICD-10-CM | POA: Diagnosis not present

## 2023-07-01 DIAGNOSIS — Z471 Aftercare following joint replacement surgery: Secondary | ICD-10-CM | POA: Diagnosis not present

## 2023-07-01 DIAGNOSIS — N1831 Chronic kidney disease, stage 3a: Secondary | ICD-10-CM | POA: Diagnosis not present

## 2023-07-01 DIAGNOSIS — I5032 Chronic diastolic (congestive) heart failure: Secondary | ICD-10-CM | POA: Diagnosis not present

## 2023-07-01 DIAGNOSIS — I2510 Atherosclerotic heart disease of native coronary artery without angina pectoris: Secondary | ICD-10-CM | POA: Diagnosis not present

## 2023-07-03 ENCOUNTER — Other Ambulatory Visit (HOSPITAL_COMMUNITY): Payer: Self-pay

## 2023-07-03 ENCOUNTER — Telehealth: Payer: Self-pay | Admitting: Cardiology

## 2023-07-03 DIAGNOSIS — I5032 Chronic diastolic (congestive) heart failure: Secondary | ICD-10-CM | POA: Diagnosis not present

## 2023-07-03 DIAGNOSIS — I447 Left bundle-branch block, unspecified: Secondary | ICD-10-CM | POA: Diagnosis not present

## 2023-07-03 DIAGNOSIS — E1122 Type 2 diabetes mellitus with diabetic chronic kidney disease: Secondary | ICD-10-CM | POA: Diagnosis not present

## 2023-07-03 DIAGNOSIS — Z471 Aftercare following joint replacement surgery: Secondary | ICD-10-CM | POA: Diagnosis not present

## 2023-07-03 DIAGNOSIS — N1831 Chronic kidney disease, stage 3a: Secondary | ICD-10-CM | POA: Diagnosis not present

## 2023-07-03 DIAGNOSIS — I2510 Atherosclerotic heart disease of native coronary artery without angina pectoris: Secondary | ICD-10-CM | POA: Diagnosis not present

## 2023-07-03 DIAGNOSIS — I4819 Other persistent atrial fibrillation: Secondary | ICD-10-CM | POA: Diagnosis not present

## 2023-07-03 DIAGNOSIS — Z96651 Presence of right artificial knee joint: Secondary | ICD-10-CM | POA: Diagnosis not present

## 2023-07-03 DIAGNOSIS — I130 Hypertensive heart and chronic kidney disease with heart failure and stage 1 through stage 4 chronic kidney disease, or unspecified chronic kidney disease: Secondary | ICD-10-CM | POA: Diagnosis not present

## 2023-07-03 NOTE — Telephone Encounter (Signed)
 I called the pt to see how much she is paying for her Jardiance  but there was no answer... I called Beth with [employer] [facility] and she will talk with the pts PCP.Aaron Aassince started by them and they are managing.

## 2023-07-03 NOTE — Telephone Encounter (Signed)
 Pt c/o medication issue:  1. Name of Medication:   JARDIANCE  10 MG TABS tablet    2. How are you currently taking this medication (dosage and times per day)?    3. Are you having a reaction (difficulty breathing--STAT)? no  4. What is your medication issue? Called to say the patient is not taking the medication because she can't afford it. Please advise

## 2023-07-05 DIAGNOSIS — Z96651 Presence of right artificial knee joint: Secondary | ICD-10-CM | POA: Diagnosis not present

## 2023-07-05 DIAGNOSIS — I4819 Other persistent atrial fibrillation: Secondary | ICD-10-CM | POA: Diagnosis not present

## 2023-07-05 DIAGNOSIS — I447 Left bundle-branch block, unspecified: Secondary | ICD-10-CM | POA: Diagnosis not present

## 2023-07-05 DIAGNOSIS — N1831 Chronic kidney disease, stage 3a: Secondary | ICD-10-CM | POA: Diagnosis not present

## 2023-07-05 DIAGNOSIS — I5032 Chronic diastolic (congestive) heart failure: Secondary | ICD-10-CM | POA: Diagnosis not present

## 2023-07-05 DIAGNOSIS — I130 Hypertensive heart and chronic kidney disease with heart failure and stage 1 through stage 4 chronic kidney disease, or unspecified chronic kidney disease: Secondary | ICD-10-CM | POA: Diagnosis not present

## 2023-07-05 DIAGNOSIS — E1122 Type 2 diabetes mellitus with diabetic chronic kidney disease: Secondary | ICD-10-CM | POA: Diagnosis not present

## 2023-07-05 DIAGNOSIS — Z471 Aftercare following joint replacement surgery: Secondary | ICD-10-CM | POA: Diagnosis not present

## 2023-07-05 DIAGNOSIS — I2510 Atherosclerotic heart disease of native coronary artery without angina pectoris: Secondary | ICD-10-CM | POA: Diagnosis not present

## 2023-07-06 DIAGNOSIS — I130 Hypertensive heart and chronic kidney disease with heart failure and stage 1 through stage 4 chronic kidney disease, or unspecified chronic kidney disease: Secondary | ICD-10-CM | POA: Diagnosis not present

## 2023-07-06 DIAGNOSIS — N1831 Chronic kidney disease, stage 3a: Secondary | ICD-10-CM | POA: Diagnosis not present

## 2023-07-06 DIAGNOSIS — I4819 Other persistent atrial fibrillation: Secondary | ICD-10-CM | POA: Diagnosis not present

## 2023-07-06 DIAGNOSIS — I2510 Atherosclerotic heart disease of native coronary artery without angina pectoris: Secondary | ICD-10-CM | POA: Diagnosis not present

## 2023-07-06 DIAGNOSIS — I5032 Chronic diastolic (congestive) heart failure: Secondary | ICD-10-CM | POA: Diagnosis not present

## 2023-07-06 DIAGNOSIS — Z96651 Presence of right artificial knee joint: Secondary | ICD-10-CM | POA: Diagnosis not present

## 2023-07-06 DIAGNOSIS — E1122 Type 2 diabetes mellitus with diabetic chronic kidney disease: Secondary | ICD-10-CM | POA: Diagnosis not present

## 2023-07-06 DIAGNOSIS — I447 Left bundle-branch block, unspecified: Secondary | ICD-10-CM | POA: Diagnosis not present

## 2023-07-06 DIAGNOSIS — Z471 Aftercare following joint replacement surgery: Secondary | ICD-10-CM | POA: Diagnosis not present

## 2023-07-06 DIAGNOSIS — D649 Anemia, unspecified: Secondary | ICD-10-CM | POA: Diagnosis not present

## 2023-07-10 DIAGNOSIS — I4819 Other persistent atrial fibrillation: Secondary | ICD-10-CM | POA: Diagnosis not present

## 2023-07-10 DIAGNOSIS — I2510 Atherosclerotic heart disease of native coronary artery without angina pectoris: Secondary | ICD-10-CM | POA: Diagnosis not present

## 2023-07-10 DIAGNOSIS — I130 Hypertensive heart and chronic kidney disease with heart failure and stage 1 through stage 4 chronic kidney disease, or unspecified chronic kidney disease: Secondary | ICD-10-CM | POA: Diagnosis not present

## 2023-07-10 DIAGNOSIS — Z96651 Presence of right artificial knee joint: Secondary | ICD-10-CM | POA: Diagnosis not present

## 2023-07-10 DIAGNOSIS — I447 Left bundle-branch block, unspecified: Secondary | ICD-10-CM | POA: Diagnosis not present

## 2023-07-10 DIAGNOSIS — Z471 Aftercare following joint replacement surgery: Secondary | ICD-10-CM | POA: Diagnosis not present

## 2023-07-10 DIAGNOSIS — N1831 Chronic kidney disease, stage 3a: Secondary | ICD-10-CM | POA: Diagnosis not present

## 2023-07-10 DIAGNOSIS — I5032 Chronic diastolic (congestive) heart failure: Secondary | ICD-10-CM | POA: Diagnosis not present

## 2023-07-10 DIAGNOSIS — E1122 Type 2 diabetes mellitus with diabetic chronic kidney disease: Secondary | ICD-10-CM | POA: Diagnosis not present

## 2023-07-12 DIAGNOSIS — I447 Left bundle-branch block, unspecified: Secondary | ICD-10-CM | POA: Diagnosis not present

## 2023-07-12 DIAGNOSIS — E1122 Type 2 diabetes mellitus with diabetic chronic kidney disease: Secondary | ICD-10-CM | POA: Diagnosis not present

## 2023-07-12 DIAGNOSIS — Z471 Aftercare following joint replacement surgery: Secondary | ICD-10-CM | POA: Diagnosis not present

## 2023-07-12 DIAGNOSIS — N1831 Chronic kidney disease, stage 3a: Secondary | ICD-10-CM | POA: Diagnosis not present

## 2023-07-12 DIAGNOSIS — I2510 Atherosclerotic heart disease of native coronary artery without angina pectoris: Secondary | ICD-10-CM | POA: Diagnosis not present

## 2023-07-12 DIAGNOSIS — Z96651 Presence of right artificial knee joint: Secondary | ICD-10-CM | POA: Diagnosis not present

## 2023-07-12 DIAGNOSIS — I5032 Chronic diastolic (congestive) heart failure: Secondary | ICD-10-CM | POA: Diagnosis not present

## 2023-07-12 DIAGNOSIS — I4819 Other persistent atrial fibrillation: Secondary | ICD-10-CM | POA: Diagnosis not present

## 2023-07-12 DIAGNOSIS — I130 Hypertensive heart and chronic kidney disease with heart failure and stage 1 through stage 4 chronic kidney disease, or unspecified chronic kidney disease: Secondary | ICD-10-CM | POA: Diagnosis not present

## 2023-07-13 DIAGNOSIS — I447 Left bundle-branch block, unspecified: Secondary | ICD-10-CM | POA: Diagnosis not present

## 2023-07-13 DIAGNOSIS — I130 Hypertensive heart and chronic kidney disease with heart failure and stage 1 through stage 4 chronic kidney disease, or unspecified chronic kidney disease: Secondary | ICD-10-CM | POA: Diagnosis not present

## 2023-07-13 DIAGNOSIS — Z471 Aftercare following joint replacement surgery: Secondary | ICD-10-CM | POA: Diagnosis not present

## 2023-07-13 DIAGNOSIS — E1122 Type 2 diabetes mellitus with diabetic chronic kidney disease: Secondary | ICD-10-CM | POA: Diagnosis not present

## 2023-07-13 DIAGNOSIS — I2510 Atherosclerotic heart disease of native coronary artery without angina pectoris: Secondary | ICD-10-CM | POA: Diagnosis not present

## 2023-07-13 DIAGNOSIS — I5032 Chronic diastolic (congestive) heart failure: Secondary | ICD-10-CM | POA: Diagnosis not present

## 2023-07-13 DIAGNOSIS — Z96651 Presence of right artificial knee joint: Secondary | ICD-10-CM | POA: Diagnosis not present

## 2023-07-13 DIAGNOSIS — N1831 Chronic kidney disease, stage 3a: Secondary | ICD-10-CM | POA: Diagnosis not present

## 2023-07-13 DIAGNOSIS — I4819 Other persistent atrial fibrillation: Secondary | ICD-10-CM | POA: Diagnosis not present

## 2023-07-18 DIAGNOSIS — N1831 Chronic kidney disease, stage 3a: Secondary | ICD-10-CM | POA: Diagnosis not present

## 2023-07-18 DIAGNOSIS — Z96651 Presence of right artificial knee joint: Secondary | ICD-10-CM | POA: Diagnosis not present

## 2023-07-18 DIAGNOSIS — I447 Left bundle-branch block, unspecified: Secondary | ICD-10-CM | POA: Diagnosis not present

## 2023-07-18 DIAGNOSIS — I5032 Chronic diastolic (congestive) heart failure: Secondary | ICD-10-CM | POA: Diagnosis not present

## 2023-07-18 DIAGNOSIS — I130 Hypertensive heart and chronic kidney disease with heart failure and stage 1 through stage 4 chronic kidney disease, or unspecified chronic kidney disease: Secondary | ICD-10-CM | POA: Diagnosis not present

## 2023-07-18 DIAGNOSIS — E1122 Type 2 diabetes mellitus with diabetic chronic kidney disease: Secondary | ICD-10-CM | POA: Diagnosis not present

## 2023-07-18 DIAGNOSIS — I4819 Other persistent atrial fibrillation: Secondary | ICD-10-CM | POA: Diagnosis not present

## 2023-07-18 DIAGNOSIS — Z471 Aftercare following joint replacement surgery: Secondary | ICD-10-CM | POA: Diagnosis not present

## 2023-07-18 DIAGNOSIS — I2510 Atherosclerotic heart disease of native coronary artery without angina pectoris: Secondary | ICD-10-CM | POA: Diagnosis not present

## 2023-07-20 DIAGNOSIS — Z471 Aftercare following joint replacement surgery: Secondary | ICD-10-CM | POA: Diagnosis not present

## 2023-07-20 DIAGNOSIS — I5032 Chronic diastolic (congestive) heart failure: Secondary | ICD-10-CM | POA: Diagnosis not present

## 2023-07-20 DIAGNOSIS — I4819 Other persistent atrial fibrillation: Secondary | ICD-10-CM | POA: Diagnosis not present

## 2023-07-20 DIAGNOSIS — I130 Hypertensive heart and chronic kidney disease with heart failure and stage 1 through stage 4 chronic kidney disease, or unspecified chronic kidney disease: Secondary | ICD-10-CM | POA: Diagnosis not present

## 2023-07-20 DIAGNOSIS — I2510 Atherosclerotic heart disease of native coronary artery without angina pectoris: Secondary | ICD-10-CM | POA: Diagnosis not present

## 2023-07-20 DIAGNOSIS — N1831 Chronic kidney disease, stage 3a: Secondary | ICD-10-CM | POA: Diagnosis not present

## 2023-07-20 DIAGNOSIS — I447 Left bundle-branch block, unspecified: Secondary | ICD-10-CM | POA: Diagnosis not present

## 2023-07-20 DIAGNOSIS — E1122 Type 2 diabetes mellitus with diabetic chronic kidney disease: Secondary | ICD-10-CM | POA: Diagnosis not present

## 2023-07-20 DIAGNOSIS — Z96651 Presence of right artificial knee joint: Secondary | ICD-10-CM | POA: Diagnosis not present

## 2023-07-20 NOTE — Progress Notes (Signed)
 Remote pacemaker transmission.

## 2023-07-24 DIAGNOSIS — E1122 Type 2 diabetes mellitus with diabetic chronic kidney disease: Secondary | ICD-10-CM | POA: Diagnosis not present

## 2023-07-24 DIAGNOSIS — I4819 Other persistent atrial fibrillation: Secondary | ICD-10-CM | POA: Diagnosis not present

## 2023-07-24 DIAGNOSIS — Z96651 Presence of right artificial knee joint: Secondary | ICD-10-CM | POA: Diagnosis not present

## 2023-07-24 DIAGNOSIS — Z471 Aftercare following joint replacement surgery: Secondary | ICD-10-CM | POA: Diagnosis not present

## 2023-07-24 DIAGNOSIS — I447 Left bundle-branch block, unspecified: Secondary | ICD-10-CM | POA: Diagnosis not present

## 2023-07-24 DIAGNOSIS — I2510 Atherosclerotic heart disease of native coronary artery without angina pectoris: Secondary | ICD-10-CM | POA: Diagnosis not present

## 2023-07-24 DIAGNOSIS — I130 Hypertensive heart and chronic kidney disease with heart failure and stage 1 through stage 4 chronic kidney disease, or unspecified chronic kidney disease: Secondary | ICD-10-CM | POA: Diagnosis not present

## 2023-07-24 DIAGNOSIS — I5032 Chronic diastolic (congestive) heart failure: Secondary | ICD-10-CM | POA: Diagnosis not present

## 2023-07-24 DIAGNOSIS — N1831 Chronic kidney disease, stage 3a: Secondary | ICD-10-CM | POA: Diagnosis not present

## 2023-07-27 DIAGNOSIS — Z5189 Encounter for other specified aftercare: Secondary | ICD-10-CM | POA: Diagnosis not present

## 2023-08-01 ENCOUNTER — Emergency Department (HOSPITAL_COMMUNITY)

## 2023-08-01 ENCOUNTER — Emergency Department (HOSPITAL_COMMUNITY)
Admission: EM | Admit: 2023-08-01 | Discharge: 2023-08-02 | Disposition: A | Discharge status: Home / Self Care | Attending: Emergency Medicine | Admitting: Emergency Medicine

## 2023-08-01 DIAGNOSIS — R110 Nausea: Secondary | ICD-10-CM | POA: Diagnosis not present

## 2023-08-01 DIAGNOSIS — K8020 Calculus of gallbladder without cholecystitis without obstruction: Secondary | ICD-10-CM | POA: Diagnosis not present

## 2023-08-01 DIAGNOSIS — Z7901 Long term (current) use of anticoagulants: Secondary | ICD-10-CM | POA: Insufficient documentation

## 2023-08-01 DIAGNOSIS — D72829 Elevated white blood cell count, unspecified: Secondary | ICD-10-CM | POA: Insufficient documentation

## 2023-08-01 DIAGNOSIS — R319 Hematuria, unspecified: Secondary | ICD-10-CM

## 2023-08-01 DIAGNOSIS — R1011 Right upper quadrant pain: Secondary | ICD-10-CM | POA: Diagnosis not present

## 2023-08-01 DIAGNOSIS — N12 Tubulo-interstitial nephritis, not specified as acute or chronic: Secondary | ICD-10-CM | POA: Insufficient documentation

## 2023-08-01 DIAGNOSIS — K5750 Diverticulosis of both small and large intestine without perforation or abscess without bleeding: Secondary | ICD-10-CM | POA: Diagnosis not present

## 2023-08-01 DIAGNOSIS — I10 Essential (primary) hypertension: Secondary | ICD-10-CM | POA: Diagnosis not present

## 2023-08-01 DIAGNOSIS — R1031 Right lower quadrant pain: Secondary | ICD-10-CM | POA: Diagnosis present

## 2023-08-01 DIAGNOSIS — N132 Hydronephrosis with renal and ureteral calculous obstruction: Secondary | ICD-10-CM | POA: Diagnosis not present

## 2023-08-01 DIAGNOSIS — R197 Diarrhea, unspecified: Secondary | ICD-10-CM | POA: Diagnosis not present

## 2023-08-01 DIAGNOSIS — K449 Diaphragmatic hernia without obstruction or gangrene: Secondary | ICD-10-CM | POA: Diagnosis not present

## 2023-08-01 LAB — CBC WITH DIFFERENTIAL/PLATELET
Abs Immature Granulocytes: 0.07 10*3/uL (ref 0.00–0.07)
Basophils Absolute: 0.1 10*3/uL (ref 0.0–0.1)
Basophils Relative: 1 %
Eosinophils Absolute: 0 10*3/uL (ref 0.0–0.5)
Eosinophils Relative: 0 %
HCT: 40.6 % (ref 36.0–46.0)
Hemoglobin: 13.5 g/dL (ref 12.0–15.0)
Immature Granulocytes: 1 %
Lymphocytes Relative: 8 %
Lymphs Abs: 1.2 10*3/uL (ref 0.7–4.0)
MCH: 31.3 pg (ref 26.0–34.0)
MCHC: 33.3 g/dL (ref 30.0–36.0)
MCV: 94.2 fL (ref 80.0–100.0)
Monocytes Absolute: 1.4 10*3/uL — ABNORMAL HIGH (ref 0.1–1.0)
Monocytes Relative: 9 %
Neutro Abs: 11.9 10*3/uL — ABNORMAL HIGH (ref 1.7–7.7)
Neutrophils Relative %: 81 %
Platelets: 194 10*3/uL (ref 150–400)
RBC: 4.31 MIL/uL (ref 3.87–5.11)
RDW: 13.2 % (ref 11.5–15.5)
WBC: 14.6 10*3/uL — ABNORMAL HIGH (ref 4.0–10.5)
nRBC: 0 % (ref 0.0–0.2)

## 2023-08-01 LAB — URINALYSIS, ROUTINE W REFLEX MICROSCOPIC
Bacteria, UA: NONE SEEN
Bilirubin Urine: NEGATIVE
Glucose, UA: 50 mg/dL — AB
Ketones, ur: 20 mg/dL — AB
Leukocytes,Ua: NEGATIVE
Nitrite: NEGATIVE
Protein, ur: 30 mg/dL — AB
RBC / HPF: >50 RBC/hpf (ref 0–5)
Specific Gravity, Urine: 1.025 (ref 1.005–1.030)
pH: 5 (ref 5.0–8.0)

## 2023-08-01 LAB — COMPREHENSIVE METABOLIC PANEL WITH GFR
ALT: 20 U/L (ref 0–44)
AST: 20 U/L (ref 15–41)
Albumin: 4.6 g/dL (ref 3.5–5.0)
Alkaline Phosphatase: 74 U/L (ref 38–126)
Anion gap: 14 (ref 5–15)
BUN: 19 mg/dL (ref 8–23)
CO2: 19 mmol/L — ABNORMAL LOW (ref 22–32)
Calcium: 9.9 mg/dL (ref 8.9–10.3)
Chloride: 104 mmol/L (ref 98–111)
Creatinine, Ser: 1.07 mg/dL — ABNORMAL HIGH (ref 0.44–1.00)
GFR, Estimated: 54 mL/min — ABNORMAL LOW (ref >60)
Glucose, Bld: 183 mg/dL — ABNORMAL HIGH (ref 70–99)
Potassium: 3.9 mmol/L (ref 3.5–5.1)
Sodium: 137 mmol/L (ref 135–145)
Total Bilirubin: 1.2 mg/dL (ref 0.0–1.2)
Total Protein: 7.7 g/dL (ref 6.5–8.1)

## 2023-08-01 LAB — I-STAT CG4 LACTIC ACID, ED: Lactic Acid, Venous: 1.2 mmol/L (ref 0.5–1.9)

## 2023-08-01 LAB — LIPASE, BLOOD: Lipase: 31 U/L (ref 11–51)

## 2023-08-01 MED ORDER — OXYCODONE-ACETAMINOPHEN 5-325 MG PO TABS
1.0000 | ORAL_TABLET | Freq: Once | ORAL | Status: AC
  Administered 2023-08-01: 1 via ORAL
  Filled 2023-08-01: qty 1

## 2023-08-01 MED ORDER — CEPHALEXIN 500 MG PO CAPS: 500.0000 mg | ORAL_CAPSULE | Freq: Four times a day (QID) | ORAL | 0 refills | Status: DC

## 2023-08-01 MED ORDER — ONDANSETRON HCL 4 MG/2ML IJ SOLN
4.0000 mg | Freq: Once | INTRAMUSCULAR | Status: AC
  Administered 2023-08-01: 4 mg via INTRAVENOUS
  Filled 2023-08-01: qty 2

## 2023-08-01 MED ORDER — CEPHALEXIN 500 MG PO CAPS
500.0000 mg | ORAL_CAPSULE | Freq: Once | ORAL | Status: AC
  Administered 2023-08-01: 500 mg via ORAL
  Filled 2023-08-01: qty 1

## 2023-08-01 MED ORDER — LACTATED RINGERS IV BOLUS
1000.0000 mL | Freq: Once | INTRAVENOUS | Status: AC
  Administered 2023-08-01: 1000 mL via INTRAVENOUS

## 2023-08-01 MED ORDER — ROPINIROLE HCL 1 MG PO TABS
4.0000 mg | ORAL_TABLET | Freq: Every day | ORAL | Status: DC
  Administered 2023-08-01: 4 mg via ORAL
  Filled 2023-08-01: qty 4

## 2023-08-01 MED ORDER — LACTATED RINGERS IV SOLN: INTRAVENOUS | Status: DC

## 2023-08-01 MED ORDER — HYDROMORPHONE HCL 1 MG/ML IJ SOLN
1.0000 mg | Freq: Once | INTRAMUSCULAR | Status: AC
  Administered 2023-08-01: 1 mg via INTRAVENOUS
  Filled 2023-08-01: qty 1

## 2023-08-01 MED ORDER — ONDANSETRON 4 MG PO TBDP: 4.0000 mg | ORAL_TABLET | ORAL | 0 refills | Status: AC | PRN

## 2023-08-01 MED ORDER — OXYCODONE-ACETAMINOPHEN 5-325 MG PO TABS: 1.0000 | ORAL_TABLET | ORAL | 0 refills | Status: DC | PRN

## 2023-08-01 MED ORDER — PIPERACILLIN-TAZOBACTAM 3.375 G IVPB 30 MIN
3.3750 g | Freq: Once | INTRAVENOUS | Status: AC
  Administered 2023-08-01: 3.375 g via INTRAVENOUS
  Filled 2023-08-01: qty 50

## 2023-08-01 MED ORDER — IOHEXOL 300 MG/ML  SOLN
100.0000 mL | Freq: Once | INTRAMUSCULAR | Status: AC | PRN
  Administered 2023-08-01: 100 mL via INTRAVENOUS

## 2023-08-01 NOTE — ED Provider Notes (Signed)
 [employer] EMERGENCY DEPARTMENT AT [facility] Provider Note   CSN: [id number] Arrival date & time: 08/01/23  8176     Patient presents with: Abdominal Pain   Briana Foster is a 78 y.o. female.  {Add pertinent medical, surgical, social history, OB history to YEP:67052} HPI Patient reports he went to bed last night feeling fine.  She awakened at about 5 AM with bad pain in her right lower abdomen.  She reports that the pain is gotten worse and migrated up some from the lower abdomen.  She reports this is a deep aching quality.  She reports she started vomiting multiple times and lost count.  No fever.  No history of similar pain.    Prior to Admission medications   Medication Sig Start Date End Date Taking? Authorizing Provider  acetaminophen  (TYLENOL ) 500 MG tablet Take 500-1,000 mg by mouth every 8 (eight) hours as needed (for pain or headaches).    [provider]  albuterol  (VENTOLIN  HFA) 108 (90 Base) MCG/ACT inhaler Inhale 2 puffs into the lungs every 4 (four) hours as needed for wheezing or shortness of breath. 05/30/22   Ghimire, Donalda HERO, MD  amLODipine  (NORVASC ) 10 MG tablet Take 10 mg by mouth daily.    [provider]  apixaban  (ELIQUIS ) 5 MG TABS tablet Take 1 tablet (5 mg total) by mouth 2 (two) times daily. 06/23/22   Camnitz, Soyla Lunger, MD  budesonide -formoterol  (SYMBICORT ) 160-4.5 MCG/ACT inhaler Inhale 2 puffs into the lungs 2 (two) times daily. Patient taking differently: Inhale 2 puffs into the lungs 2 (two) times daily as needed (asthma). 05/30/22   Ghimire, Donalda HERO, MD  celecoxib  (CELEBREX ) 100 MG capsule Take 1 capsule (100 mg total) by mouth 2 (two) times daily. Take with food 06/21/23 06/20/24  Patti Rosina SAUNDERS, PA-C  Continuous Glucose Sensor (DEXCOM G7 SENSOR) MISC Inject 1 Device into the skin See admin instructions. Place 1 new sensor into the skin every 10 days Patient not taking: Reported on 05/15/2023    [provider]  glimepiride  (AMARYL ) 2 MG tablet Take 2 mg by mouth daily with breakfast.    [provider]  JARDIANCE  10 MG TABS tablet Take 10 mg by mouth daily. 12/09/22   [provider]  meclizine  (ANTIVERT ) 25 MG tablet Take 25 mg by mouth 2 (two) times daily as needed for dizziness. 09/09/22   [provider]  memantine  (NAMENDA ) 5 MG tablet Take 5 mg by mouth in the morning.    [provider]  metFORMIN  (GLUCOPHAGE ) 500 MG tablet Take 500 mg by mouth daily with breakfast. 12/09/22   [provider]  methocarbamol  (ROBAXIN ) 500 MG tablet Take 1 tablet (500 mg total) by mouth every 6 (six) hours as needed for muscle spasms. 06/14/23   Patti Rosina SAUNDERS, PA-C  Multiple Vitamins-Minerals (CENTRUM SILVER 50+WOMEN) TABS Take 1 tablet by mouth daily with breakfast.    [provider]  olmesartan  (BENICAR ) 20 MG tablet Take 20 mg by mouth daily. 03/29/22   [provider]  pantoprazole  (PROTONIX ) 20 MG tablet Take 1 tablet (20 mg total) by mouth daily. 06/21/23 06/20/24  Patti Rosina SAUNDERS, PA-C  polyethylene glycol (MIRALAX  / GLYCOLAX ) 17 g packet Take 17 g by mouth 2 (two) times daily. 06/14/23   Patti Rosina SAUNDERS, PA-C  rOPINIRole  (REQUIP ) 4 MG tablet Take 4 mg by mouth at bedtime. Take 4 mg by mouth at 8 PM    [provider]  rosuvastatin  (CRESTOR ) 10 MG tablet Take 1 tablet (10 mg total) by mouth daily. 12/07/22   Thukkani, Arun K, MD    Allergies: Bee venom, Claritin  [loratadine ], Wasp venom, Apricot flavoring agent (non-screening), Atorvastatin, and Losartan    Review of Systems  Updated Vital Signs BP 121/76 (BP Location: Left Arm)   Pulse 62   Temp 98.1 F (36.7 C) (Oral)   Resp 18   SpO2 98%   Physical Exam Constitutional:      Comments: Patient is alert.  Very uncomfortable in appearance with emesis bag with small amounts of thin yellow fluid.  Mental status clear.  No respiratory distress.  HENT:     Mouth/Throat:      Pharynx: Oropharynx is clear.  Eyes:     Extraocular Movements: Extraocular movements intact.  Cardiovascular:     Rate and Rhythm: Normal rate and regular rhythm.  Pulmonary:     Effort: Pulmonary effort is normal.     Breath sounds: Normal breath sounds.  Abdominal:     Comments: Patient is very tender to palpation in the right lower abdomen and the right upper abdomen.  Guarding present.  Abdomen nondistended.  Left abdomen nontender.  Musculoskeletal:        General: No swelling or tenderness. Normal range of motion.     Right lower leg: No edema.     Left lower leg: No edema.  Skin:    General: Skin is warm and dry.  Neurological:     General: No focal deficit present.     Mental Status: She is oriented to person, place, and time.     Motor: No weakness.     Coordination: Coordination normal.     (all labs ordered are listed, but only abnormal results are displayed) Labs Reviewed  COMPREHENSIVE METABOLIC PANEL WITH GFR - Abnormal; Notable for the following components:      Result Value   CO2 19 (*)    Glucose, Bld 183 (*)    Creatinine, Ser 1.07 (*)    GFR, Estimated 54 (*)    All other components within normal limits  URINALYSIS, ROUTINE W REFLEX MICROSCOPIC - Abnormal; Notable for the following components:   Glucose, UA 50 (*)    Hgb urine dipstick LARGE (*)    Ketones, ur 20 (*)    Protein, ur 30 (*)    All other components within normal limits  CBC WITH DIFFERENTIAL/PLATELET - Abnormal; Notable for the following components:   WBC 14.6 (*)    Neutro Abs 11.9 (*)    Monocytes Absolute 1.4 (*)    All other components within normal limits  CULTURE, BLOOD (ROUTINE X 2)  CULTURE, BLOOD (ROUTINE X 2)  URINE CULTURE  LIPASE, BLOOD  I-STAT CG4 LACTIC ACID, ED  I-STAT CG4 LACTIC ACID, ED    EKG: None  Radiology: CT ABDOMEN PELVIS W CONTRAST Result Date: 08/01/2023 CLINICAL DATA:  Abdominal pain. EXAM: CT ABDOMEN AND PELVIS WITH CONTRAST TECHNIQUE: Multidetector  CT imaging of the abdomen and pelvis was performed using the standard protocol following bolus administration of intravenous contrast. RADIATION DOSE REDUCTION: This exam was performed according to the departmental dose-optimization program which includes automated exposure control, adjustment of the mA and/or kV according to patient size and/or use of iterative reconstruction technique. CONTRAST:  OMNIPAQUE  IOHEXOL  300 MG/ML  SOLN COMPARISON:  Noncontrast CT 04/13/2023 FINDINGS: Lower chest: No acute finding. Cluster of serpiginous vessels in the right lower lobe, series 5, image 15 is unchanged  from prior exam, consistent with pulmonary AVM. Pacemaker wires are partially included Hepatobiliary: Tiny hypodensity in the right lobe of the liver is unchanged from prior exam. No evidence of solid liver lesion. Small gallstone. Moderate gallbladder distension. No evidence pericholecystic inflammation. No biliary dilatation. Pancreas: No ductal dilatation or inflammation. Spleen: Normal in size without focal abnormality. Adrenals/Urinary Tract: Normal adrenal glands. Mild right hydroureteronephrosis and perinephric stranding. There is no obstructing stone. Question of urothelial thickening in the distal right ureter, series 2, image 77. There is cortical scarring in the upper right kidney with punctate nonobstructing stone. No left hydronephrosis. No suspicious renal lesion. Unremarkable urinary bladder. Stomach/Bowel: Small hiatal hernia with wall thickening of the distal esophagus. Minimal duodenal diverticulum. No small bowel obstruction or inflammation. Normal appendix. Colonic diverticulosis without diverticulitis. Vascular/Lymphatic: Scattered small retroperitoneal, periportal and inguinal lymph nodes, not enlarged by size criteria. No acute vascular findings. Aortic atherosclerosis. No aortic aneurysm. Reproductive: Uterus and bilateral adnexa are unremarkable. Other: No free air, free fluid, or  intra-abdominal fluid collection. Musculoskeletal: Degenerative change in the lumbar spine and pubic symphysis. No acute osseous findings. IMPRESSION: 1. Mild right hydroureteronephrosis and perinephric stranding. No obstructing stone. Question of urothelial thickening in the distal right ureter. Findings may be due to recently passed stone or urinary tract infection, however the possibility of neoplasm is not excluded. Recommend direct visualization with cystoscopy. 2. Small hiatal hernia with wall thickening of the distal esophagus, can be seen with reflux or esophagitis. 3. Colonic diverticulosis without diverticulitis. 4. Cholelithiasis. Aortic Atherosclerosis (ICD10-I70.0). Electronically Signed   By: Andrea Gasman M.D.   On: 08/01/2023 21:49    {Document cardiac monitor, telemetry assessment procedure when appropriate:32947} Procedures   Medications Ordered in the ED  lactated ringers  infusion ( Intravenous New Bag/Given 08/01/23 2212)  rOPINIRole  (REQUIP ) tablet 4 mg (4 mg Oral Given 08/01/23 2304)  HYDROmorphone  (DILAUDID ) injection 1 mg (1 mg Intravenous Given 08/01/23 2009)  ondansetron  (ZOFRAN ) injection 4 mg (4 mg Intravenous Given 08/01/23 2009)  lactated ringers  bolus 1,000 mL (0 mLs Intravenous Stopped 08/01/23 2213)  piperacillin -tazobactam (ZOSYN ) IVPB 3.375 g (0 g Intravenous Stopped 08/01/23 2213)  iohexol  (OMNIPAQUE ) 300 MG/ML solution 100 mL (100 mLs Intravenous Contrast Given 08/01/23 2105)      {Click here for ABCD2, HEART and other calculators REFRESH Note before signing:1}                              Medical Decision Making Amount and/or Complexity of Data Reviewed Labs: ordered. Radiology: ordered.  Risk Prescription drug management. Decision regarding hospitalization.   Patient presents as outlined with fairly rapid onset of right lower abdominal pain that involves the lower abdomen but also the upper abdomen.  On palpation patient does seem to have peritoneal signs.   Differential diagnosis includes appendicitis possibly with perforation\cholecystitis\bowel obstruction\kidney stone.  Will proceed with pain control, lab work and CT imaging.  Patient is white count returns at 14.6.  Normal H&H.  GFR 54.  With leukocytosis and significantly reproducible right lower quadrant pain concerning for appendicitis or possibly perforation, will empirically start Zosyn .  Patient was treated with Dilaudid  and Zofran  for symptoms.  Recheck at 20: 40, patient feels much better.  She is not having any vomiting.  She is on her phone.  We are awaiting CT imaging.  CT shows inflammation of the ureter with stranding.  Urinalysis positive for blood and 6-10 white cells.  {Document critical care  time when appropriate  Document review of labs and clinical decision tools ie CHADS2VASC2, etc  Document your independent review of radiology images and any outside records  Document your discussion with family members, caretakers and with consultants  Document social determinants of health affecting pt's care  Document your decision making why or why not admission, treatments were needed:32947:::1}   Final diagnoses:  Pyelonephritis    ED Discharge Orders     None

## 2023-08-01 NOTE — Discharge Instructions (Addendum)
 1.  Start taking your antibiotic tomorrow as prescribed.  A urine culture has been done.  Follow-up on these results with your doctor. 2.  It appears most likely that you recently passed a kidney stone.  You have inflammation of the ureter, the tube from your kidney to the bladder.  No stone is seen in the ureter at this time on your CT scan.  You report you have had frequent kidney stones in the past.  Take pain medications, percocet or extra strength Tylenol  as prescribed if needed.  Take nausea medication, Zofran , as prescribed if needed. 3.  Schedule follow-up with your doctor for recheck to make sure that all of your symptoms are resolving.  Return to emergency department immediately if you get a fever, pain uncontrolled or vomiting.

## 2023-08-01 NOTE — ED Triage Notes (Signed)
 Pt bib [facility] for abdominal pain after eating food that was left out right lower and right upper quadrant abdominal pain. N/V/D started around 5am

## 2023-08-02 DIAGNOSIS — Z471 Aftercare following joint replacement surgery: Secondary | ICD-10-CM | POA: Diagnosis not present

## 2023-08-02 DIAGNOSIS — N1831 Chronic kidney disease, stage 3a: Secondary | ICD-10-CM | POA: Diagnosis not present

## 2023-08-02 DIAGNOSIS — I130 Hypertensive heart and chronic kidney disease with heart failure and stage 1 through stage 4 chronic kidney disease, or unspecified chronic kidney disease: Secondary | ICD-10-CM | POA: Diagnosis not present

## 2023-08-02 DIAGNOSIS — I4819 Other persistent atrial fibrillation: Secondary | ICD-10-CM | POA: Diagnosis not present

## 2023-08-02 DIAGNOSIS — I447 Left bundle-branch block, unspecified: Secondary | ICD-10-CM | POA: Diagnosis not present

## 2023-08-02 DIAGNOSIS — E1122 Type 2 diabetes mellitus with diabetic chronic kidney disease: Secondary | ICD-10-CM | POA: Diagnosis not present

## 2023-08-02 DIAGNOSIS — I5032 Chronic diastolic (congestive) heart failure: Secondary | ICD-10-CM | POA: Diagnosis not present

## 2023-08-02 DIAGNOSIS — I2510 Atherosclerotic heart disease of native coronary artery without angina pectoris: Secondary | ICD-10-CM | POA: Diagnosis not present

## 2023-08-02 DIAGNOSIS — Z96651 Presence of right artificial knee joint: Secondary | ICD-10-CM | POA: Diagnosis not present

## 2023-08-02 LAB — I-STAT CG4 LACTIC ACID, ED: Lactic Acid, Venous: 1 mmol/L (ref 0.5–1.9)

## 2023-08-02 LAB — URINE CULTURE: Culture: NO GROWTH

## 2023-08-03 DIAGNOSIS — N12 Tubulo-interstitial nephritis, not specified as acute or chronic: Secondary | ICD-10-CM | POA: Diagnosis not present

## 2023-08-07 LAB — CULTURE, BLOOD (ROUTINE X 2)
Culture: NO GROWTH
Culture: NO GROWTH
Special Requests: ADEQUATE
Special Requests: ADEQUATE

## 2023-08-28 ENCOUNTER — Ambulatory Visit (INDEPENDENT_AMBULATORY_CARE_PROVIDER_SITE_OTHER): Payer: Medicare Other

## 2023-08-28 DIAGNOSIS — I4819 Other persistent atrial fibrillation: Secondary | ICD-10-CM | POA: Diagnosis not present

## 2023-08-29 LAB — CUP PACEART REMOTE DEVICE CHECK
Battery Remaining Longevity: 114 mo
Battery Remaining Percentage: 82 %
Battery Voltage: 3.02 V
Brady Statistic RV Percent Paced: 99 %
Date Time Interrogation Session: 20250728020015
Implantable Lead Connection Status: 753985
Implantable Lead Implant Date: 20220928
Implantable Lead Location: 753860
Implantable Pulse Generator Implant Date: 20220928
Lead Channel Impedance Value: 440 Ohm
Lead Channel Pacing Threshold Amplitude: 0.75 V
Lead Channel Pacing Threshold Pulse Width: 0.4 ms
Lead Channel Sensing Intrinsic Amplitude: 9.7 mV
Lead Channel Setting Pacing Amplitude: 1 V
Lead Channel Setting Pacing Pulse Width: 0.4 ms
Lead Channel Setting Sensing Sensitivity: 2 mV
Pulse Gen Model: 1272
Pulse Gen Serial Number: 3937998

## 2023-08-30 DIAGNOSIS — M25561 Pain in right knee: Secondary | ICD-10-CM | POA: Diagnosis not present

## 2023-08-30 DIAGNOSIS — M6281 Muscle weakness (generalized): Secondary | ICD-10-CM | POA: Diagnosis not present

## 2023-08-30 DIAGNOSIS — M25661 Stiffness of right knee, not elsewhere classified: Secondary | ICD-10-CM | POA: Diagnosis not present

## 2023-09-04 ENCOUNTER — Ambulatory Visit: Payer: Self-pay | Admitting: Cardiology

## 2023-09-06 DIAGNOSIS — M25561 Pain in right knee: Secondary | ICD-10-CM | POA: Diagnosis not present

## 2023-09-06 DIAGNOSIS — M25661 Stiffness of right knee, not elsewhere classified: Secondary | ICD-10-CM | POA: Diagnosis not present

## 2023-09-06 DIAGNOSIS — M6281 Muscle weakness (generalized): Secondary | ICD-10-CM | POA: Diagnosis not present

## 2023-09-12 DIAGNOSIS — M6281 Muscle weakness (generalized): Secondary | ICD-10-CM | POA: Diagnosis not present

## 2023-09-12 DIAGNOSIS — M25561 Pain in right knee: Secondary | ICD-10-CM | POA: Diagnosis not present

## 2023-09-12 DIAGNOSIS — M25661 Stiffness of right knee, not elsewhere classified: Secondary | ICD-10-CM | POA: Diagnosis not present

## 2023-11-02 NOTE — Progress Notes (Signed)
 Remote PPM Transmission

## 2023-11-13 DIAGNOSIS — M79604 Pain in right leg: Secondary | ICD-10-CM | POA: Diagnosis not present

## 2023-11-13 DIAGNOSIS — Z96651 Presence of right artificial knee joint: Secondary | ICD-10-CM | POA: Diagnosis not present

## 2023-11-15 ENCOUNTER — Ambulatory Visit (HOSPITAL_COMMUNITY): Admission: RE | Admit: 2023-11-15 | Source: Ambulatory Visit

## 2023-11-15 ENCOUNTER — Other Ambulatory Visit (HOSPITAL_COMMUNITY): Payer: Self-pay | Admitting: Student

## 2023-11-15 DIAGNOSIS — M79604 Pain in right leg: Secondary | ICD-10-CM

## 2023-11-17 ENCOUNTER — Ambulatory Visit (HOSPITAL_COMMUNITY)
Admission: RE | Admit: 2023-11-17 | Discharge: 2023-11-17 | Disposition: A | Discharge status: Home / Self Care | Source: Ambulatory Visit | Attending: Student | Admitting: Student

## 2023-11-17 DIAGNOSIS — M79604 Pain in right leg: Secondary | ICD-10-CM | POA: Insufficient documentation

## 2023-11-27 ENCOUNTER — Ambulatory Visit (INDEPENDENT_AMBULATORY_CARE_PROVIDER_SITE_OTHER): Payer: Medicare Other

## 2023-11-27 DIAGNOSIS — I4819 Other persistent atrial fibrillation: Secondary | ICD-10-CM | POA: Diagnosis not present

## 2023-11-28 LAB — CUP PACEART REMOTE DEVICE CHECK
Battery Remaining Longevity: 112 mo
Battery Remaining Percentage: 80 %
Battery Voltage: 3.02 V
Brady Statistic RV Percent Paced: 99 %
Date Time Interrogation Session: 20251027020014
Implantable Lead Connection Status: 753985
Implantable Lead Implant Date: 20220928
Implantable Lead Location: 753860
Implantable Pulse Generator Implant Date: 20220928
Lead Channel Impedance Value: 440 Ohm
Lead Channel Pacing Threshold Amplitude: 0.75 V
Lead Channel Pacing Threshold Pulse Width: 0.4 ms
Lead Channel Sensing Intrinsic Amplitude: 9.7 mV
Lead Channel Setting Pacing Amplitude: 1 V
Lead Channel Setting Pacing Pulse Width: 0.4 ms
Lead Channel Setting Sensing Sensitivity: 2 mV
Pulse Gen Model: 1272
Pulse Gen Serial Number: 3937998

## 2023-11-29 ENCOUNTER — Ambulatory Visit: Payer: Self-pay | Admitting: Cardiology

## 2023-11-30 NOTE — Progress Notes (Signed)
 Remote PPM Transmission

## 2023-12-04 DIAGNOSIS — F331 Major depressive disorder, recurrent, moderate: Secondary | ICD-10-CM | POA: Diagnosis not present

## 2023-12-04 DIAGNOSIS — I10 Essential (primary) hypertension: Secondary | ICD-10-CM | POA: Diagnosis not present

## 2023-12-04 DIAGNOSIS — E538 Deficiency of other specified B group vitamins: Secondary | ICD-10-CM | POA: Diagnosis not present

## 2023-12-04 DIAGNOSIS — K219 Gastro-esophageal reflux disease without esophagitis: Secondary | ICD-10-CM | POA: Diagnosis not present

## 2023-12-04 DIAGNOSIS — F0390 Unspecified dementia without behavioral disturbance: Secondary | ICD-10-CM | POA: Diagnosis not present

## 2023-12-04 DIAGNOSIS — Z0000 Encounter for general adult medical examination without abnormal findings: Secondary | ICD-10-CM | POA: Diagnosis not present

## 2023-12-04 DIAGNOSIS — Z1331 Encounter for screening for depression: Secondary | ICD-10-CM | POA: Diagnosis not present

## 2023-12-04 DIAGNOSIS — J449 Chronic obstructive pulmonary disease, unspecified: Secondary | ICD-10-CM | POA: Diagnosis not present

## 2023-12-04 DIAGNOSIS — I5032 Chronic diastolic (congestive) heart failure: Secondary | ICD-10-CM | POA: Diagnosis not present

## 2023-12-04 DIAGNOSIS — E1140 Type 2 diabetes mellitus with diabetic neuropathy, unspecified: Secondary | ICD-10-CM | POA: Diagnosis not present

## 2023-12-04 DIAGNOSIS — Z23 Encounter for immunization: Secondary | ICD-10-CM | POA: Diagnosis not present

## 2023-12-04 DIAGNOSIS — E782 Mixed hyperlipidemia: Secondary | ICD-10-CM | POA: Diagnosis not present

## 2023-12-09 ENCOUNTER — Other Ambulatory Visit: Payer: Self-pay

## 2023-12-09 ENCOUNTER — Encounter (HOSPITAL_COMMUNITY): Payer: Self-pay | Admitting: Emergency Medicine

## 2023-12-09 ENCOUNTER — Inpatient Hospital Stay (HOSPITAL_COMMUNITY)
Admission: EM | Admit: 2023-12-09 | Discharge: 2023-12-12 | DRG: 281 | Disposition: A | Discharge status: Home / Self Care | Attending: Internal Medicine | Admitting: Internal Medicine

## 2023-12-09 ENCOUNTER — Emergency Department (HOSPITAL_COMMUNITY)

## 2023-12-09 DIAGNOSIS — K219 Gastro-esophageal reflux disease without esophagitis: Secondary | ICD-10-CM | POA: Diagnosis present

## 2023-12-09 DIAGNOSIS — N1831 Chronic kidney disease, stage 3a: Secondary | ICD-10-CM | POA: Diagnosis present

## 2023-12-09 DIAGNOSIS — N179 Acute kidney failure, unspecified: Secondary | ICD-10-CM | POA: Diagnosis present

## 2023-12-09 DIAGNOSIS — E1122 Type 2 diabetes mellitus with diabetic chronic kidney disease: Secondary | ICD-10-CM | POA: Diagnosis present

## 2023-12-09 DIAGNOSIS — Z950 Presence of cardiac pacemaker: Secondary | ICD-10-CM

## 2023-12-09 DIAGNOSIS — I161 Hypertensive emergency: Principal | ICD-10-CM | POA: Diagnosis present

## 2023-12-09 DIAGNOSIS — I2510 Atherosclerotic heart disease of native coronary artery without angina pectoris: Secondary | ICD-10-CM | POA: Diagnosis present

## 2023-12-09 DIAGNOSIS — E66811 Obesity, class 1: Secondary | ICD-10-CM | POA: Diagnosis present

## 2023-12-09 DIAGNOSIS — Z7982 Long term (current) use of aspirin: Secondary | ICD-10-CM

## 2023-12-09 DIAGNOSIS — E785 Hyperlipidemia, unspecified: Secondary | ICD-10-CM | POA: Diagnosis present

## 2023-12-09 DIAGNOSIS — R0789 Other chest pain: Secondary | ICD-10-CM

## 2023-12-09 DIAGNOSIS — Z7951 Long term (current) use of inhaled steroids: Secondary | ICD-10-CM | POA: Diagnosis not present

## 2023-12-09 DIAGNOSIS — I495 Sick sinus syndrome: Secondary | ICD-10-CM | POA: Diagnosis present

## 2023-12-09 DIAGNOSIS — G2581 Restless legs syndrome: Secondary | ICD-10-CM | POA: Diagnosis present

## 2023-12-09 DIAGNOSIS — Z8679 Personal history of other diseases of the circulatory system: Secondary | ICD-10-CM | POA: Diagnosis not present

## 2023-12-09 DIAGNOSIS — Z823 Family history of stroke: Secondary | ICD-10-CM

## 2023-12-09 DIAGNOSIS — Z8249 Family history of ischemic heart disease and other diseases of the circulatory system: Secondary | ICD-10-CM

## 2023-12-09 DIAGNOSIS — J45909 Unspecified asthma, uncomplicated: Secondary | ICD-10-CM | POA: Diagnosis present

## 2023-12-09 DIAGNOSIS — M069 Rheumatoid arthritis, unspecified: Secondary | ICD-10-CM | POA: Diagnosis present

## 2023-12-09 DIAGNOSIS — Z7984 Long term (current) use of oral hypoglycemic drugs: Secondary | ICD-10-CM

## 2023-12-09 DIAGNOSIS — Z7901 Long term (current) use of anticoagulants: Secondary | ICD-10-CM

## 2023-12-09 DIAGNOSIS — Z96651 Presence of right artificial knee joint: Secondary | ICD-10-CM | POA: Diagnosis present

## 2023-12-09 DIAGNOSIS — I130 Hypertensive heart and chronic kidney disease with heart failure and stage 1 through stage 4 chronic kidney disease, or unspecified chronic kidney disease: Secondary | ICD-10-CM | POA: Diagnosis not present

## 2023-12-09 DIAGNOSIS — I214 Non-ST elevation (NSTEMI) myocardial infarction: Principal | ICD-10-CM | POA: Diagnosis present

## 2023-12-09 DIAGNOSIS — I5032 Chronic diastolic (congestive) heart failure: Secondary | ICD-10-CM | POA: Diagnosis present

## 2023-12-09 DIAGNOSIS — Z952 Presence of prosthetic heart valve: Secondary | ICD-10-CM

## 2023-12-09 DIAGNOSIS — I5A Non-ischemic myocardial injury (non-traumatic): Secondary | ICD-10-CM | POA: Diagnosis not present

## 2023-12-09 DIAGNOSIS — I249 Acute ischemic heart disease, unspecified: Secondary | ICD-10-CM | POA: Diagnosis not present

## 2023-12-09 DIAGNOSIS — I340 Nonrheumatic mitral (valve) insufficiency: Secondary | ICD-10-CM | POA: Diagnosis present

## 2023-12-09 DIAGNOSIS — Z79899 Other long term (current) drug therapy: Secondary | ICD-10-CM | POA: Diagnosis not present

## 2023-12-09 DIAGNOSIS — F0390 Unspecified dementia without behavioral disturbance: Secondary | ICD-10-CM | POA: Diagnosis present

## 2023-12-09 DIAGNOSIS — Z791 Long term (current) use of non-steroidal anti-inflammatories (NSAID): Secondary | ICD-10-CM

## 2023-12-09 DIAGNOSIS — R7989 Other specified abnormal findings of blood chemistry: Secondary | ICD-10-CM

## 2023-12-09 DIAGNOSIS — Z6831 Body mass index (BMI) 31.0-31.9, adult: Secondary | ICD-10-CM

## 2023-12-09 DIAGNOSIS — I4819 Other persistent atrial fibrillation: Secondary | ICD-10-CM | POA: Diagnosis present

## 2023-12-09 DIAGNOSIS — R778 Other specified abnormalities of plasma proteins: Secondary | ICD-10-CM | POA: Diagnosis not present

## 2023-12-09 DIAGNOSIS — I517 Cardiomegaly: Secondary | ICD-10-CM | POA: Diagnosis not present

## 2023-12-09 DIAGNOSIS — Z91030 Bee allergy status: Secondary | ICD-10-CM

## 2023-12-09 DIAGNOSIS — Z9102 Food additives allergy status: Secondary | ICD-10-CM

## 2023-12-09 DIAGNOSIS — R079 Chest pain, unspecified: Secondary | ICD-10-CM | POA: Diagnosis not present

## 2023-12-09 DIAGNOSIS — Z888 Allergy status to other drugs, medicaments and biological substances status: Secondary | ICD-10-CM

## 2023-12-09 LAB — COMPREHENSIVE METABOLIC PANEL WITH GFR
ALT: 22 U/L (ref 0–44)
AST: 22 U/L (ref 15–41)
Albumin: 4 g/dL (ref 3.5–5.0)
Alkaline Phosphatase: 66 U/L (ref 38–126)
Anion gap: 15 (ref 5–15)
BUN: 17 mg/dL (ref 8–23)
CO2: 21 mmol/L — ABNORMAL LOW (ref 22–32)
Calcium: 9.9 mg/dL (ref 8.9–10.3)
Chloride: 102 mmol/L (ref 98–111)
Creatinine, Ser: 1.2 mg/dL — ABNORMAL HIGH (ref 0.44–1.00)
GFR, Estimated: 47 mL/min — ABNORMAL LOW (ref >60)
Glucose, Bld: 190 mg/dL — ABNORMAL HIGH (ref 70–99)
Potassium: 3.5 mmol/L (ref 3.5–5.1)
Sodium: 138 mmol/L (ref 135–145)
Total Bilirubin: 0.8 mg/dL (ref 0.0–1.2)
Total Protein: 6 g/dL — ABNORMAL LOW (ref 6.5–8.1)

## 2023-12-09 LAB — CBC WITH DIFFERENTIAL/PLATELET
Abs Immature Granulocytes: 0.03 K/uL (ref 0.00–0.07)
Basophils Absolute: 0.1 K/uL (ref 0.0–0.1)
Basophils Relative: 1 %
Eosinophils Absolute: 0.1 K/uL (ref 0.0–0.5)
Eosinophils Relative: 1 %
HCT: 43.8 % (ref 36.0–46.0)
Hemoglobin: 14.7 g/dL (ref 12.0–15.0)
Immature Granulocytes: 0 %
Lymphocytes Relative: 23 %
Lymphs Abs: 2 K/uL (ref 0.7–4.0)
MCH: 30.2 pg (ref 26.0–34.0)
MCHC: 33.6 g/dL (ref 30.0–36.0)
MCV: 90.1 fL (ref 80.0–100.0)
Monocytes Absolute: 0.8 K/uL (ref 0.1–1.0)
Monocytes Relative: 10 %
Neutro Abs: 5.5 K/uL (ref 1.7–7.7)
Neutrophils Relative %: 65 %
Platelets: 194 K/uL (ref 150–400)
RBC: 4.86 MIL/uL (ref 3.87–5.11)
RDW: 12.9 % (ref 11.5–15.5)
WBC: 8.5 K/uL (ref 4.0–10.5)
nRBC: 0 % (ref 0.0–0.2)

## 2023-12-09 LAB — TROPONIN I (HIGH SENSITIVITY)
Troponin I (High Sensitivity): 81 ng/L — ABNORMAL HIGH (ref <18)
Troponin I (High Sensitivity): 459 ng/L (ref <18)

## 2023-12-09 LAB — CBG MONITORING, ED: Glucose-Capillary: 264 mg/dL — ABNORMAL HIGH (ref 70–99)

## 2023-12-09 MED ORDER — ONDANSETRON HCL 4 MG/2ML IJ SOLN: 4.0000 mg | Freq: Four times a day (QID) | INTRAMUSCULAR | Status: DC | PRN

## 2023-12-09 MED ORDER — INSULIN ASPART 100 UNIT/ML IJ SOLN
0.0000 [IU] | INTRAMUSCULAR | Status: DC
  Administered 2023-12-09: 8 [IU] via SUBCUTANEOUS
  Administered 2023-12-10 (×2): 5 [IU] via SUBCUTANEOUS
  Filled 2023-12-09: qty 2
  Filled 2023-12-09: qty 8
  Filled 2023-12-09: qty 5

## 2023-12-09 MED ORDER — ROSUVASTATIN CALCIUM 5 MG PO TABS
10.0000 mg | ORAL_TABLET | Freq: Every day | ORAL | Status: DC
  Administered 2023-12-10: 10 mg via ORAL
  Filled 2023-12-09: qty 2

## 2023-12-09 MED ORDER — ACETAMINOPHEN 325 MG PO TABS
650.0000 mg | ORAL_TABLET | ORAL | Status: DC | PRN
  Administered 2023-12-10 (×2): 650 mg via ORAL
  Filled 2023-12-09 (×2): qty 2

## 2023-12-09 MED ORDER — ALBUTEROL SULFATE (2.5 MG/3ML) 0.083% IN NEBU
2.5000 mg | INHALATION_SOLUTION | RESPIRATORY_TRACT | Status: DC | PRN
Start: 2023-12-09 — End: 2023-12-12

## 2023-12-09 MED ORDER — SODIUM CHLORIDE 0.9% FLUSH
3.0000 mL | Freq: Two times a day (BID) | INTRAVENOUS | Status: DC
  Administered 2023-12-10 – 2023-12-12 (×6): 3 mL via INTRAVENOUS

## 2023-12-09 MED ORDER — NITROGLYCERIN IN D5W 200-5 MCG/ML-% IV SOLN
0.0000 ug/min | INTRAVENOUS | Status: DC
  Administered 2023-12-09: 5 ug/min via INTRAVENOUS
  Filled 2023-12-09: qty 250

## 2023-12-09 MED ORDER — SODIUM CHLORIDE 0.9 % IV SOLN: 250.0000 mL | INTRAVENOUS | Status: AC | PRN

## 2023-12-09 MED ORDER — SODIUM CHLORIDE 0.9% FLUSH: 3.0000 mL | INTRAVENOUS | Status: DC | PRN

## 2023-12-09 MED ORDER — AMLODIPINE BESYLATE 5 MG PO TABS
10.0000 mg | ORAL_TABLET | Freq: Once | ORAL | Status: AC
  Administered 2023-12-09: 10 mg via ORAL
  Filled 2023-12-09: qty 2

## 2023-12-09 MED ORDER — AMLODIPINE BESYLATE 10 MG PO TABS
10.0000 mg | ORAL_TABLET | Freq: Every day | ORAL | Status: DC
  Administered 2023-12-10 – 2023-12-12 (×3): 10 mg via ORAL
  Filled 2023-12-09 (×3): qty 1

## 2023-12-09 MED ORDER — HYDRALAZINE HCL 20 MG/ML IJ SOLN: 10.0000 mg | INTRAMUSCULAR | Status: DC | PRN

## 2023-12-09 MED ORDER — HEPARIN (PORCINE) 25000 UT/250ML-% IV SOLN
1200.0000 [IU]/h | INTRAVENOUS | Status: DC
  Administered 2023-12-09: 1200 [IU]/h via INTRAVENOUS
  Filled 2023-12-09: qty 250

## 2023-12-09 MED ORDER — NITROGLYCERIN 0.4 MG SL SUBL: 0.4000 mg | SUBLINGUAL_TABLET | SUBLINGUAL | Status: DC | PRN

## 2023-12-09 MED ORDER — MEMANTINE HCL 10 MG PO TABS
5.0000 mg | ORAL_TABLET | Freq: Two times a day (BID) | ORAL | Status: DC
  Administered 2023-12-09 – 2023-12-12 (×6): 5 mg via ORAL
  Filled 2023-12-09 (×6): qty 1

## 2023-12-09 MED ORDER — IRBESARTAN 150 MG PO TABS
150.0000 mg | ORAL_TABLET | Freq: Every day | ORAL | Status: DC
  Administered 2023-12-10 – 2023-12-12 (×3): 150 mg via ORAL
  Filled 2023-12-09 (×3): qty 1

## 2023-12-09 MED ORDER — ASPIRIN 81 MG PO TBEC
81.0000 mg | DELAYED_RELEASE_TABLET | Freq: Every day | ORAL | Status: DC
  Administered 2023-12-10 – 2023-12-11 (×2): 81 mg via ORAL
  Filled 2023-12-09 (×2): qty 1

## 2023-12-09 NOTE — ED Triage Notes (Signed)
 VS for EMS BP 180/100, HR 62, 22 RR, 92% on RA CBG 173. Pain 4/10 pta.

## 2023-12-09 NOTE — ED Provider Notes (Addendum)
 [place] EMERGENCY DEPARTMENT AT [facility] Provider Note   CSN: [id number] Arrival date & time: 12/09/23  2032     Patient presents with: Chest Pain   Briana Foster is a 78 y.o. female history of hypertension, A-fib on Eliquis , bradycardia with a pacemaker here presenting with chest pain.  She states that around 7 PM she had an episode of substernal chest pain.  Patient states that it lasted about 30 minutes.  She went to fire department to get her blood pressure checked and was elevated.  Patient then was transported here to for further evaluation.  Given aspirin  and nitro prior to arrival.  Pain-free currently.  Patient has history of aortic stenosis but no CAD or stents previously.   The history is provided by the spouse and the patient.       Prior to Admission medications   Medication Sig Start Date End Date Taking? Authorizing Provider  acetaminophen  (TYLENOL ) 500 MG tablet Take 500-1,000 mg by mouth every 8 (eight) hours as needed (for pain or headaches).    [provider]  albuterol  (VENTOLIN  HFA) 108 (90 Base) MCG/ACT inhaler Inhale 2 puffs into the lungs every 4 (four) hours as needed for wheezing or shortness of breath. 05/30/22   Ghimire, Donalda HERO, MD  amLODipine  (NORVASC ) 10 MG tablet Take 10 mg by mouth daily.    [provider]  apixaban  (ELIQUIS ) 5 MG TABS tablet Take 1 tablet (5 mg total) by mouth 2 (two) times daily. 06/23/22   Camnitz, Soyla Lunger, MD  budesonide -formoterol  (SYMBICORT ) 160-4.5 MCG/ACT inhaler Inhale 2 puffs into the lungs 2 (two) times daily. Patient taking differently: Inhale 2 puffs into the lungs 2 (two) times daily as needed (asthma). 05/30/22   Ghimire, Donalda HERO, MD  celecoxib  (CELEBREX ) 100 MG capsule Take 1 capsule (100 mg total) by mouth 2 (two) times daily. Take with food 06/21/23 06/20/24  Patti Rosina SAUNDERS, PA-C  cephALEXin  (KEFLEX ) 500 MG capsule Take 1 capsule (500 mg total) by mouth 4 (four) times daily.  08/01/23   Armenta Canning, MD  Continuous Glucose Sensor (DEXCOM G7 SENSOR) MISC Inject 1 Device into the skin See admin instructions. Place 1 new sensor into the skin every 10 days Patient not taking: Reported on 05/15/2023    [provider]  glimepiride  (AMARYL ) 2 MG tablet Take 2 mg by mouth daily with breakfast.    [provider]  JARDIANCE  10 MG TABS tablet Take 10 mg by mouth daily. 12/09/22   [provider]  meclizine  (ANTIVERT ) 25 MG tablet Take 25 mg by mouth 2 (two) times daily as needed for dizziness. 09/09/22   [provider]  memantine  (NAMENDA ) 5 MG tablet Take 5 mg by mouth in the morning.    [provider]  metFORMIN  (GLUCOPHAGE ) 500 MG tablet Take 500 mg by mouth daily with breakfast. 12/09/22   [provider]  methocarbamol  (ROBAXIN ) 500 MG tablet Take 1 tablet (500 mg total) by mouth every 6 (six) hours as needed for muscle spasms. 06/14/23   Patti Rosina SAUNDERS, PA-C  Multiple Vitamins-Minerals (CENTRUM SILVER 50+WOMEN) TABS Take 1 tablet by mouth daily with breakfast.    [provider]  olmesartan  (BENICAR ) 20 MG tablet Take 20 mg by mouth daily. 03/29/22   [provider]  ondansetron  (ZOFRAN -ODT) 4 MG disintegrating tablet Take 1 tablet (4 mg total) by mouth every 4 (four) hours as needed for nausea or vomiting. 08/01/23   Armenta Canning, MD  oxyCODONE -acetaminophen  (PERCOCET) 5-325 MG tablet Take 1 tablet by mouth every 4 (four) hours as needed. 08/01/23   Armenta Canning, MD  pantoprazole  (PROTONIX ) 20 MG tablet Take 1 tablet (20 mg total) by mouth daily. 06/21/23 06/20/24  Patti Rosina SAUNDERS, PA-C  polyethylene glycol (MIRALAX  / GLYCOLAX ) 17 g packet Take 17 g by mouth 2 (two) times daily. 06/14/23   Patti Rosina SAUNDERS, PA-C  rOPINIRole  (REQUIP ) 4 MG tablet Take 4 mg by mouth at bedtime. Take 4 mg by mouth at 8 PM    [provider]  rosuvastatin  (CRESTOR ) 10 MG tablet Take 1 tablet (10 mg total) by mouth  daily. 12/07/22   Thukkani, Arun K, MD    Allergies: Bee venom, Claritin  [loratadine ], Wasp venom, Apricot flavoring agent (non-screening), Atorvastatin, and Losartan    Review of Systems  Cardiovascular:  Positive for chest pain.  All other systems reviewed and are negative.   Updated Vital Signs BP (!) 175/74   Pulse 62   Temp 97.7 F (36.5 C) (Oral)   Resp 16   Ht 5' 8 (1.727 m)   Wt 81.6 kg   SpO2 98%   BMI 27.37 kg/m   Physical Exam Vitals and nursing note reviewed.  Constitutional:      Appearance: She is well-developed.  HENT:     Head: Normocephalic.  Eyes:     Extraocular Movements: Extraocular movements intact.     Pupils: Pupils are equal, round, and reactive to light.  Cardiovascular:     Rate and Rhythm: Normal rate and regular rhythm.     Heart sounds: Normal heart sounds.  Pulmonary:     Effort: Pulmonary effort is normal.     Breath sounds: Normal breath sounds.  Abdominal:     General: Bowel sounds are normal.     Palpations: Abdomen is soft.  Musculoskeletal:        General: Normal range of motion.     Cervical back: Normal range of motion and neck supple.  Skin:    General: Skin is warm.  Neurological:     General: No focal deficit present.     Mental Status: She is alert and oriented to person, place, and time.  Psychiatric:        Mood and Affect: Mood normal.        Behavior: Behavior normal.     (all labs ordered are listed, but only abnormal results are displayed) Labs Reviewed  CBC WITH DIFFERENTIAL/PLATELET  COMPREHENSIVE METABOLIC PANEL WITH GFR  TROPONIN I (HIGH SENSITIVITY)    EKG: EKG Interpretation Date/Time:  Saturday December 09 2023 20:39:42 EST Ventricular Rate:  60 PR Interval:  251 QRS Duration:  158 QT Interval:  503 QTC Calculation: 503 R Axis:   -77  Text Interpretation: VENTRICULAR PACED RHYTHM Prolonged PR interval LVH with IVCD, LAD and secondary repol abnrm Inferior infarct, acute (RCA) Anterolateral  infarct, old Prolonged QT interval Probable RV involvement, suggest recording right precordial leads No significant change since last tracing Confirmed by Patt Alm DEL [phone]) on 12/09/2023 8:53:31 PM  Radiology: No results found.   Procedures   CRITICAL CARE Performed by: Alm DEL Patt   Total critical care time: 38 minutes  Critical care time was exclusive of separately billable procedures and treating other patients.  Critical care was necessary to treat or prevent imminent or life-threatening deterioration.  Critical care was time spent personally by me on the following activities: development of treatment plan with patient and/or surrogate as well  as nursing, discussions with consultants, evaluation of patient's response to treatment, examination of patient, obtaining history from patient or surrogate, ordering and performing treatments and interventions, ordering and review of laboratory studies, ordering and review of radiographic studies, pulse oximetry and re-evaluation of patient's condition.   Medications Ordered in the ED  amLODipine  (NORVASC ) tablet 10 mg (10 mg Oral Given 12/09/23 2055)                                    Medical Decision Making Mozel B Calvo is a 78 y.o. female here presenting with chest pain.  Patient has substernal chest pain earlier today.  Patient was given aspirin  nitro and is pain-free now.  Patient was noted to be hypertensive.  Patient has a history of hypertension and is on Norvasc  at home.  Consider ACS versus symptomatic hypertension.  I do not think patient has a dissection currently so if chest x-ray does not show widened mediastinum will not get a CTA.  Will give extra dose of Norvasc .  9:43 PM His blood pressure went up to the 190s now.  Patient's troponin is elevated to 81.  Still has some chest pressure.  Started on nitro drip due to elevated blood pressure and elevated troponin.  Consulted cardiology to see patient.  Patient's last  cardiac catheterization is 2022 and did not have any significant CAD at that time.  9:51 PM Discussed with Dr. Otelia from cardiology, who will see patient.   10:32 PM Dr. Otelia saw patient.  He felt that it is likely hypertensive emergency and recommend continue IV nitroglycerin .  He will hold off on heparin  for now.  If the second troponin is markedly elevated then start heparin .  He recommend hospitalist admission for hypertensive emergency  11:44 PM Second trop is 459. Will start on heparin  drip for NSTEMI. Hospitalist aware   Problems Addressed: Elevated troponin: acute illness or injury Hypertensive emergency: acute illness or injury NSTEMI (non-ST elevated myocardial infarction) [facility]): acute illness or injury  Amount and/or Complexity of Data Reviewed Labs: ordered. Decision-making details documented in ED Course. Radiology: ordered and independent interpretation performed. Decision-making details documented in ED Course.  Risk Prescription drug management. Decision regarding hospitalization.     Final diagnoses:  None    ED Discharge Orders     None          Patt Alm Macho, MD 12/09/23 2258    Patt Alm Macho, MD 12/09/23 [phone]

## 2023-12-09 NOTE — ED Notes (Signed)
 Dr. Allena Katz at bedside

## 2023-12-09 NOTE — Progress Notes (Addendum)
 PHARMACY - ANTICOAGULATION CONSULT NOTE  Pharmacy Consult for heparin  Indication: chest pain/ACS, afib  Allergies  Allergen Reactions   Bee Venom Shortness Of Breath and Rash   Claritin  [Loratadine ] Itching and Palpitations   Wasp Venom Anaphylaxis   Apricot Flavoring Agent (Non-Screening) Swelling and Other (See Comments)    Eyes swell shut   Atorvastatin Itching and Swelling    Eye swelling and headaches   Losartan Itching and Rash    Patient Measurements: Height: 5' 8 (172.7 cm) Weight: 81.6 kg (180 lb) IBW/kg (Calculated) : 63.9 HEPARIN  DW (KG): 80.4  Vital Signs: Temp: 97.7 F (36.5 C) (11/08 2052) Temp Source: Oral (11/08 2052) BP: 187/99 (11/08 2220) Pulse Rate: 60 (11/08 2220)  Labs: Recent Labs    12/09/23 2047 12/09/23 2229  HGB 14.7  --   HCT 43.8  --   PLT 194  --   CREATININE 1.20*  --   TROPONINIHS 81* 459*    Estimated Creatinine Clearance: 45.4 mL/min (A) (by C-G formula based on SCr of 1.2 mg/dL (H)).   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 Verlin & Dr. Dusty   Severe aortic stenosis    Type 2 diabetes mellitus without  complication, without long-term current use of insulin  (HCC) 04/10/2018   Vitamin D  deficiency    Assessment: 47 yoF presented with chest tightness. Pharmacy consulted to dose heparin  for ACS. PMH: HTN, severe AS s/p TAVR (2022), tachy/brady syndrome s/p PPM, afib (eliquis ), CKD, T2DM  -Last dose of eliquis  11/8 @0930  will utilize aPTTs for heparin  monitoring given anti-Xa level will be falsely elevated due to apixaban  -CBC WNL -Trop 81 > 459  Goal of Therapy:  Heparin  level 0.3-0.7 units/ml aPTT 66-102 seconds Monitor platelets by anticoagulation protocol: Yes   Plan:  No bolus given recent eliquis  use Start heparin  infusion at 1200 units/hr Check aPTT and anti-Xa level in 8 hours and daily while on heparin  Monitor with aPTTs until correlates with anti-Xa level Continue to monitor H&H and platelets F/u TTE in AM and plans for LHC  Lynwood Poplar, PharmD, BCPS Clinical Pharmacist 12/09/2023 11:37 PM

## 2023-12-09 NOTE — Consult Note (Addendum)
 Cardiology Consultation   Patient ID: Briana Foster MRN: [id number]; DOB: 1945-02-18  Admit date: 12/09/2023 Date of Consult: 12/09/2023  PCP:  Ransom Other, MD   [facility] Providers Cardiologist:  Lurena MARLA Red, MD  Electrophysiologist:  Soyla Gladis Norton, MD       Patient Profile: Briana Foster is a 78 y.o. female with a hx of T2DM, HTN, severe AS s/p TAVR in 05/2020, carotid artery disease, tachy-brady syndrome s/p DC PPM, persistent AF on apixaban , CKD stage 3A, RA, and GERD who is being seen 12/09/2023 for the evaluation of chest tightness.   History of Present Illness: Ms. Briana Foster reports that she's been dealing with some stressful and emotional issues at home. Today, she started to feel some non-exertional, non-radiating mild chest tightness at around 6PM. No associated shortness of breath, nausea, vomiting, or diaphoresis. She suspected that this may be related to elevated BP so she asked her grandson to take her to the fire department to get her BP checked. There, she reportedly had SBP >200. She received ASA and SL NG and was subsequently transferred to the ED for further management.   In the ED, her SBP remained in the 190s. She was given amlodipine  10 mg and subsequently started on nitroglycerin  gtt.   She still reports ongoing chest tightness that's mild and has not changed since its onset. She does not recall prior similar episodes. Of note, she had an admission in 03/2022 for chest pain and elevated troponin to 51 in the setting of hypertensive emergency. She however does not recall if her current pain is similar to that one.   She reports taking all her prescribed medications with no missed doses. She does not check BP at home. Last BP on 11/6 in clinic was 140/70.   Labs in the ED showed Cr 1.2 (near baseline) and initial troponin of 81. EKG showed AF and V-paced rhythm.  Past Medical History:  Diagnosis Date   Anemia    Arthritis 07/20/2012    hands   Asthma    Back pain    Carotid arterial disease    CHF (congestive heart failure) (HCC)    Chronic kidney disease    CKD 3a   Class 1 obesity with serious comorbidity and body mass index (BMI) of 31.0 to 31.9 in adult 03/26/2018   Coronary artery disease    Depression    Dysrhythmia    S. 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 Verlin & Dr. Dusty   Severe aortic stenosis    Type 2 diabetes mellitus without complication, without long-term current use of insulin  (HCC) 04/10/2018   Vitamin D  deficiency     Past Surgical History:  Procedure Laterality Date   AV NODE ABLATION N/A 07/14/2021   Procedure: AV NODE ABLATION;  Surgeon: Norton Soyla Gladis, MD;  Location: [facility];  Service: Cardiovascular;  Laterality: N/A;   BUNIONECTOMY  20 yrs ago   bil feet   BUNIONECTOMY  11/30/2011   Procedure: ROMAYNE;  Surgeon: Lynwood SHAUNNA Bern, MD;  Location: [facility];  Service: Orthopedics;  Laterality: Bilateral;  RIGHT FOOT EXCISION OF BUNIONETTE AND PARTIAL   PROXIMAL PHALANGECTOMY OF 5TH TOE LEFT FOOT FUNK BUNIONECTOMY,EXCISION OF BUNIONETTE    CARDIOVERSION N/A 11/05/2019   Procedure: CARDIOVERSION;  Surgeon: Pietro Redell RAMAN, MD;  Location: [facility] ENDOSCOPY;  Service: Cardiovascular;  Laterality: N/A;   CARDIOVERSION N/A 12/05/2019   Procedure: CARDIOVERSION;  Surgeon: Loni Soyla LABOR, MD;  Location: [facility] ENDOSCOPY;  Service: Cardiovascular;  Laterality: N/A;   COLONOSCOPY WITH PROPOFOL  N/A 08/07/2012   Procedure: COLONOSCOPY WITH PROPOFOL ;  Surgeon:  Gladis MARLA Louder, MD;  Location: [facility]OS[facility];  Service: Endoscopy;  Laterality: N/A;   MOUTH SURGERY     teeth extractions   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   Procedure: PACEMAKER IMPLANT;  Surgeon: Inocencio Soyla Gladis, MD;  Location: [facility];  Service: Cardiovascular;  Laterality: N/A;   RIGHT/LEFT HEART CATH AND CORONARY ANGIOGRAPHY N/A 06/17/2020   Procedure: RIGHT/LEFT HEART CATH AND CORONARY ANGIOGRAPHY;  Surgeon: Claudene Victory ORN, MD;  Location: [facility];  Service: Cardiovascular;  Laterality: N/A;   TONSILLECTOMY     TOTAL KNEE ARTHROPLASTY Right 06/13/2023   Procedure: ARTHROPLASTY, KNEE, TOTAL;  Surgeon: Ernie Cough, MD;  Location: [facility];  Service: Orthopedics;  Laterality: Right;   TRANSCATHETER AORTIC VALVE REPLACEMENT, TRANSFEMORAL N/A 06/30/2020   Procedure: TRANSCATHETER AORTIC VALVE REPLACEMENT, TRANSFEMORAL;  Surgeon: Verlin Lonni BIRCH, MD;  Location: [facility];  Service: Open Heart Surgery;  Laterality: N/A;   TUBAL LIGATION     WRIST SURGERY       Home Medications:  Prior to Admission medications   Medication Sig Start Date End Date Taking? Authorizing Provider  acetaminophen  (TYLENOL ) 500 MG tablet Take 500-1,000 mg by mouth every 8 (eight) hours as needed (for pain or headaches).    [provider]  albuterol  (VENTOLIN  HFA) 108 (90 Base) MCG/ACT inhaler Inhale 2 puffs into the lungs every 4 (four) hours as needed for wheezing or shortness of breath. 05/30/22   Ghimire, Donalda HERO, MD  amLODipine  (NORVASC ) 10 MG tablet Take 10 mg by mouth daily.    [provider]  apixaban  (ELIQUIS ) 5 MG TABS tablet Take 1 tablet (5 mg total) by mouth 2 (two) times daily. 06/23/22   Camnitz, Soyla Gladis, MD  budesonide -formoterol  (SYMBICORT ) 160-4.5 MCG/ACT inhaler Inhale 2 puffs into the lungs 2 (two) times daily. Patient taking differently: Inhale 2 puffs into the lungs 2 (two) times daily as needed (asthma). 05/30/22   Ghimire, Donalda HERO, MD  celecoxib  (CELEBREX ) 100 MG capsule Take 1 capsule (100 mg total) by mouth 2 (two) times daily. Take with food 06/21/23 06/20/24  Patti Rosina SAUNDERS, PA-C  cephALEXin  (KEFLEX ) 500 MG capsule Take 1 capsule (500 mg total) by mouth 4 (four) times daily. 08/01/23   Armenta Canning, MD  Continuous Glucose Sensor (DEXCOM G7 SENSOR) MISC Inject 1 Device into the skin See admin instructions. Place 1 new sensor into the skin every 10 days Patient not taking: Reported on 05/15/2023    [provider]  glimepiride  (AMARYL ) 2 MG tablet Take 2 mg by mouth daily with breakfast.    [provider]  JARDIANCE  10 MG TABS tablet Take 10 mg by mouth daily. 12/09/22   [provider]  meclizine  (ANTIVERT ) 25 MG tablet Take 25 mg by mouth 2 (two) times daily as needed for dizziness. 09/09/22   [provider]  memantine  (NAMENDA ) 5 MG tablet Take 5 mg by mouth in the morning.    [provider]  metFORMIN  (GLUCOPHAGE ) 500 MG tablet Take 500 mg by mouth daily with breakfast. 12/09/22   [provider]  methocarbamol  (ROBAXIN ) 500 MG tablet Take 1 tablet (500 mg total) by mouth every 6 (six) hours as needed for muscle spasms. 06/14/23   Patti Rosina SAUNDERS, PA-C  Multiple Vitamins-Minerals (CENTRUM SILVER 50+WOMEN) TABS Take 1 tablet by mouth daily with breakfast.    [provider]  olmesartan  (BENICAR ) 20 MG tablet Take 20 mg by mouth daily. 03/29/22   [provider]  ondansetron  (ZOFRAN -ODT) 4 MG disintegrating tablet Take 1 tablet (4 mg total) by mouth every 4 (four) hours as needed for nausea or vomiting. 08/01/23   Armenta Canning, MD  oxyCODONE -acetaminophen  (PERCOCET) 5-325 MG tablet Take 1 tablet by mouth every 4 (four) hours as needed. 08/01/23   Armenta Canning, MD  pantoprazole  (PROTONIX ) 20 MG tablet Take 1 tablet (20 mg total) by mouth daily. 06/21/23 06/20/24  Patti Rosina SAUNDERS, PA-C  polyethylene glycol (MIRALAX  / GLYCOLAX ) 17 g packet Take 17 g  by mouth 2 (two) times daily. 06/14/23   Patti Rosina SAUNDERS, PA-C  rOPINIRole  (REQUIP ) 4 MG tablet Take 4 mg by mouth at bedtime. Take 4 mg by mouth at 8 PM    [provider]  rosuvastatin  (CRESTOR ) 10 MG tablet Take 1 tablet (10 mg total) by mouth daily. 12/07/22   Thukkani, Arun K, MD    Scheduled Meds:  Continuous Infusions:  nitroGLYCERIN      PRN Meds:   Allergies:    Allergies  Allergen Reactions   Bee Venom Shortness Of Breath and Rash   Claritin  [Loratadine ] Itching and Palpitations   Wasp Venom Anaphylaxis   Apricot Flavoring Agent (Non-Screening) Swelling and Other (See Comments)    Eyes swell shut   Atorvastatin Itching, Swelling and Other (See Comments)    Eye swelling and headaches   Losartan Itching and Rash    Social History:   Social History   Socioeconomic History   Marital status: Widowed    Spouse name: Not on file   Number of children: 2   Years of education: Not on file   Highest education level: Not on file  Occupational History   Occupation: Retired-worked at [employer]  Tobacco Use   Smoking status: Never   Smokeless tobacco: Never  Vaping Use   Vaping status: Never Used  Substance and Sexual Activity   Alcohol use: No   Drug use: No   Sexual activity: Not Currently  Other Topics Concern   Not on file  Social History Narrative   Not on file   Social Drivers of Health   Financial Resource Strain: Not on file  Food Insecurity: No Food Insecurity (06/13/2023)   Hunger Vital Sign    Worried About Running Out of Food in the Last Year: Never true    Ran Out of Food in the Last Year: Never true  Transportation Needs: No Transportation Needs (06/13/2023)   PRAPARE - Administrator, Civil Service (Medical): No    Lack of Transportation (Non-Medical): No  Physical Activity: Not on file  Stress: Not on file  Social Connections: Moderately Integrated (06/13/2023)   Social Connection and Isolation Panel    Frequency of  Communication with Friends and Family: More than three times a week    Frequency of Social Gatherings with Friends and Family:  More than three times a week    Attends Religious Services: More than 4 times per year    Active Member of Clubs or Organizations: No    Attends Banker Meetings: 1 to 4 times per year    Marital Status: Widowed  Intimate Partner Violence: Not At Risk (06/13/2023)   Humiliation, Afraid, Rape, and Kick questionnaire    Fear of Current or Ex-Partner: No    Emotionally Abused: No    Physically Abused: No    Sexually Abused: No    Family History:   Family History  Problem Relation Age of Onset   Heart disease Mother    Stroke Mother    Cancer Father        Prostate     ROS:  Please see the history of present illness.  All other ROS reviewed and negative.     Physical Exam/Data: Vitals:   12/09/23 2037 12/09/23 2045 12/09/23 2052 12/09/23 2055  BP:  (!) 175/94  (!) 175/74  Pulse:  62    Resp:  16    Temp:  97.6 F (36.4 C) 97.7 F (36.5 C)   TempSrc:  Oral Oral   SpO2:  98%    Weight: 81.6 kg     Height: 5' 8 (1.727 m)      No intake or output data in the 24 hours ending 12/09/23 2148    12/09/2023    8:37 PM 06/13/2023   12:23 PM 06/01/2023   10:04 AM  Last 3 Weights  Weight (lbs) 180 lb 195 lb 195 lb  Weight (kg) 81.647 kg 88.451 kg 88.451 kg     Body mass index is 27.37 kg/m.  General:  Well nourished, well developed, in no acute distress HEENT: normal Neck: no JVD Vascular: No carotid bruits; Distal pulses 2+ bilaterally Cardiac:  normal S1, S2; RRR; no murmur  Lungs:  clear to auscultation bilaterally, no wheezing, rhonchi or rales  Abd: soft, nontender, no hepatomegaly  Ext: no edema Musculoskeletal:  No deformities, BUE and BLE strength normal and equal Skin: warm and dry  Neuro:  CNs 2-12 intact, no focal abnormalities noted Psych:  Normal affect   EKG:  The EKG was personally reviewed and demonstrates:  AF and  V-paced rhythm.  Relevant CV Studies:   TTE 04/2023:  1. Left ventricular ejection fraction, by estimation, is 65 to 70%. The  left ventricle has normal function. The left ventricle has no regional  wall motion abnormalities. There is mild concentric left ventricular  hypertrophy. Left ventricular diastolic  parameters are indeterminate.   2. Right ventricular systolic function is normal. The right ventricular  size is normal.   3. Left atrial size was severely dilated.   4. Right atrial size was moderately dilated.   5. The mitral valve is abnormal. Mild mitral valve regurgitation. No  evidence of mitral stenosis. Moderate to severe mitral annular  calcification.   6. Tricuspid valve regurgitation is moderate.   7. The mean gradinent through the TAVR valve has increased from on previous echo to 26.60mmHG. Visually leaflets are mildly thicked but open reasonably well. Suspect elevated gradient likely related to high flow. (VTI > 70). The aortic valve has been repaired/replaced. Aortic valve regurgitation is not visualized. No aortic stenosis is present. There is a 23 mm Edwards Sapien prosthetic (TAVR) valve present in the aortic position. Procedure Date: 06/30/2020.  Echo findings are consistent with normal structure and function of the aortic valve prosthesis.  8. The inferior vena cava is dilated in size with <50% respiratory  variability, suggesting right atrial pressure of 15 mmHg.   L/RHC 5/022: Very poor rate control related to patient not taking medications.  Atrial fibrillation with RVR varying between 101 160 bpm.  Therefore, the patient was treated with IV metoprolol  receiving 15 mg and 5 mg doses over 30 minutes before proceeding.  This decrease the heart rate into the 90 to 115 bpm range. Widely patent coronary arteries.  Vessels are tortuous but have no significant obstruction. Normal pulmonary artery pressures with mean pulmonary wedge 23 mmHg (influenced by  rate). Variable RR interval made it difficult to appropriately assess left ventricular gradient.  Eyeball peak to peak gradient is 20 mmHg. AV coronary and mitral annular calcification noted on cine fluoroscopy. Cardiac output by Fick 4.82 L/min with index 1.99 L/min/m.  These findings are consistent with potential low-flow low gradient aortic stenosis as suspected.  Calculated aortic valve area 1.0 cm based upon a mean gradient of 14 mmHg.    Laboratory Data: High Sensitivity Troponin:   Recent Labs  Lab 12/09/23 2047  TROPONINIHS 81*     Chemistry Recent Labs  Lab 12/09/23 2047  NA 138  K 3.5  CL 102  CO2 21*  GLUCOSE 190*  BUN 17  CREATININE 1.20*  CALCIUM  9.9  GFRNONAA 47*  ANIONGAP 15    Recent Labs  Lab 12/09/23 2047  PROT 6.0*  ALBUMIN 4.0  AST 22  ALT 22  ALKPHOS 66  BILITOT 0.8   Lipids No results for input(s): CHOL, TRIG, HDL, LABVLDL, LDLCALC, CHOLHDL in the last 168 hours.  Hematology Recent Labs  Lab 12/09/23 2047  WBC 8.5  RBC 4.86  HGB 14.7  HCT 43.8  MCV 90.1  MCH 30.2  MCHC 33.6  RDW 12.9  PLT 194   Thyroid  No results for input(s): TSH, FREET4 in the last 168 hours.  BNPNo results for input(s): BNP, PROBNP in the last 168 hours.  DDimer No results for input(s): DDIMER in the last 168 hours.  Radiology/Studies:  DG Chest 2 View Result Date: 12/09/2023 EXAM: 2 VIEW(S) XRAY OF THE CHEST 12/09/2023 09:11:00 PM COMPARISON: None available. CLINICAL HISTORY: cp FINDINGS: LINES, TUBES AND DEVICES: TAVR noted. Left chest pacemaker with single lead terminating in right ventricle. LUNGS AND PLEURA: No focal pulmonary opacity. No pleural effusion. HEART AND MEDIASTINUM: Mild cardiomegaly. BONES AND SOFT TISSUES: Multilevel thoracic osteophytosis. IMPRESSION: 1. No acute cardiopulmonary abnormality. Electronically signed by: Oneil Devonshire MD 12/09/2023 09:21 PM EST RP Workstation: [id number]     Assessment and Plan:  Chest  tightness  Abnormal troponin, likely 2/2 myocardial injury from hypertensive emergency Presents with mild chest tightness in the setting of markedly elevated BP with initial SBP reportedly >200 and persistent SBP in the 190s in the ED. EKG with AF with V-paced rhythm. This presentation is mostly consistent with hypertensive emergency with myocardial injury. - Continue nitroglycerin  gtt - Continue home amlodipine  10 mg daily and olmseratan 20 mg daily (she's unsure if she's taking olmsertan but confirmed taking amlodipine ; may need to increase olmsertan dose based on her response but avoiding doing this now given unclear if she's taking it at home) - Follow up next troponin. If there're dynamic changes, would start her on heparin  gtt until TTE is obtained to rule out WMA. If troponin is flat, this would further support myocardial injury related to hypertensive emergency (as opposed to ACS) and would continue her home apixaban  in that  case. Of note, she had normal coronary angiogram in 2022 as part of TAVR work-up. - TTE in the AM    Addendum:  - Repeat troponin was 459 up from 81. Given significant delta, would go ahead and treat as ACS with heparin  gtt. She already received full dose aspirin  with the fire department .  For questions or updates, please contact [facility] Please consult [email or web address] for contact info under      Signed, Gillian CHRISTELLA Cass, MD  12/09/2023 9:48 PM

## 2023-12-09 NOTE — ED Notes (Signed)
Pt provided sandwich per request.

## 2023-12-09 NOTE — ED Triage Notes (Signed)
 Pt arrive by [facility] from home for c/o cp and HTN  started today 1830 . Pt got 324 mg ASA and 0.4 nitroglycerin  sl given by EMS prior to arrival.

## 2023-12-09 NOTE — H&P (Signed)
 History and Physical    Patient: Briana Foster DOB: November 28, 1945 DOA: 12/09/2023 DOS: the patient was seen and examined on 12/09/2023 . PCP: Ransom Other, MD  Patient coming from: Home Chief complaint: Chief Complaint  Patient presents with   Chest Pain   HPI:  Briana Foster is a 78 y.o. female with past medical history  of persistent A-fib on Eliquis , chronic diastolic congestive heart failure, essential hypertension, aortic stenosis status post TAVR,, history of pyelonephritis, obesity with a BMI of 27.37/uncontrolled diabetes mellitus type 2, rheumatoid arthritis presenting today with chest pains. Patient came by EMS for chest pain and noted to have hypertension she was given aspirin  and nitro by EMS.  Initial vitals noted to have blood pressure 180/100 heart rate 62 respirations of 22 O2 sats 92% on room air glucose of 173 chest pain was 4 out of 10. Of note PTA meds include albuterol  amlodipine  10 Eliquis  5 Symbicort  Celebrex  Amaryl  Jardiance  metformin  Namenda  Antivert  Robaxin  Zofran  Percocet Protonix  Requip  Crestor . Patient has a history of dementia at bedside is alert awake and oriented to self and location but is a limited HPI states that around 630 she had chest pain that was midsternal nonradiating and she went to the fire department where she was told her blood pressure is very elevated. Patient also has multiple allergies which include bee venom wasp venom apricot flavoring agents and medications including Claritin  atorvastatin and losartan.  ED Course:  Vital signs in the ED were notable for the following:  Vitals:   12/09/23 2200 12/09/23 2220 12/09/23 2245 12/09/23 2315  BP: (!) 187/102 (!) 187/99 (!) 168/84 135/80  Pulse: 62 60 (!) 59 62  Temp:      Resp: (!) 22 18 16 20   Height:      Weight:      SpO2: 96% 97% 96% 97%  TempSrc:      BMI (Calculated):       >>ED evaluation thus far shows: EKG shows a ventricular paced rhythm with a  interventricular conduction delay LVH and a prolonged PR with a heart rate of 68. Initial troponin of 81 and repeat at 459. Initial CMP showing bicarb of 21 glucose 190 AKI with a creatinine 1.20 normal LFTs. Initial CBC is within normal limits. Chest x-ray is negative for any acute cardiopulmonary abnormality.   >>While in the ED patient received the following: Medications  nitroGLYCERIN  50 mg in dextrose  5 % 250 mL (0.2 mg/mL) infusion (5 mcg/min Intravenous New Bag/Given 12/09/23 2232)  amLODipine  (NORVASC ) tablet 10 mg (10 mg Oral Given 12/09/23 2055)   Review of Systems  Respiratory:  Positive for shortness of breath.   Cardiovascular:  Positive for chest pain.   Past Medical History:  Diagnosis Date   Anemia    Arthritis 07/20/2012   hands   Asthma    Back pain    Carotid arterial disease    CHF (congestive heart failure) (HCC)    Chronic kidney disease    CKD 3a   Class 1 obesity with serious comorbidity and body mass index (BMI) of 31.0 to 31.9 in adult 03/26/2018   Coronary artery disease    Depression    Dysrhythmia    S. Fib,   had ablation   Essential hypertension 03/26/2018   GERD (gastroesophageal reflux disease)    comes and goes - no meds currently   History of kidney stones    Hyperlipidemia    Joint pain    Persistent atrial fibrillation (  HCC)    Presence of permanent cardiac pacemaker    Restless leg syndrome    Rheumatoid arthritis (HCC)    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   s/p TAVR with a 23 mm Edwards S3U via the TF approach by Dr. Verlin & Dr. Dusty   Severe aortic stenosis    Type 2 diabetes mellitus without complication, without long-term current use of insulin  (HCC) 04/10/2018   Vitamin D  deficiency    Past Surgical History:  Procedure Laterality Date   AV NODE ABLATION N/A 07/14/2021   Procedure: AV NODE ABLATION;  Surgeon: Inocencio Soyla Lunger, MD;  Location: [facility];  Service: Cardiovascular;   Laterality: N/A;   BUNIONECTOMY  20 yrs ago   bil feet   BUNIONECTOMY  11/30/2011   Procedure: BUNIONECTOMY;  Surgeon: Lynwood SHAUNNA Bern, MD;  Location: [facility];  Service: Orthopedics;  Laterality: Bilateral;  RIGHT FOOT EXCISION OF BUNIONETTE AND PARTIAL   PROXIMAL PHALANGECTOMY OF 5TH TOE LEFT FOOT FUNK BUNIONECTOMY,EXCISION OF BUNIONETTE    CARDIOVERSION N/A 11/05/2019   Procedure: CARDIOVERSION;  Surgeon: Pietro Redell RAMAN, MD;  Location: [facility] ENDOSCOPY;  Service: Cardiovascular;  Laterality: N/A;   CARDIOVERSION N/A 12/05/2019   Procedure: CARDIOVERSION;  Surgeon: Loni Soyla LABOR, MD;  Location: [facility] ENDOSCOPY;  Service: Cardiovascular;  Laterality: N/A;   COLONOSCOPY WITH PROPOFOL  N/A 08/07/2012   Procedure: COLONOSCOPY WITH PROPOFOL ;  Surgeon: Lunger MARLA Louder, MD;  Location: [facility] ENDOSCOPY;  Service: Endoscopy;  Laterality: N/A;   MOUTH SURGERY     teeth extractions   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   Procedure: PACEMAKER IMPLANT;  Surgeon: Inocencio Soyla Lunger, MD;  Location: [facility];  Service: Cardiovascular;  Laterality: N/A;   RIGHT/LEFT HEART CATH AND CORONARY ANGIOGRAPHY N/A 06/17/2020   Procedure: RIGHT/LEFT HEART CATH AND CORONARY ANGIOGRAPHY;  Surgeon: Claudene Victory ORN, MD;  Location: [facility];  Service: Cardiovascular;  Laterality: N/A;   TONSILLECTOMY     TOTAL KNEE ARTHROPLASTY Right 06/13/2023   Procedure: ARTHROPLASTY, KNEE, TOTAL;  Surgeon: Ernie Cough, MD;  Location: [facility];  Service: Orthopedics;  Laterality: Right;   TRANSCATHETER AORTIC VALVE REPLACEMENT, TRANSFEMORAL N/A 06/30/2020   Procedure: TRANSCATHETER AORTIC VALVE REPLACEMENT, TRANSFEMORAL;  Surgeon: Verlin Lonni BIRCH, MD;  Location: [facility];  Service: Open Heart Surgery;  Laterality: N/A;   TUBAL LIGATION     WRIST SURGERY      reports that she has never smoked. She has never used smokeless tobacco. She reports that she does not drink alcohol and does not use drugs. Allergies   Allergen Reactions   Bee Venom Shortness Of Breath and Rash   Claritin  [Loratadine ] Itching and Palpitations   Wasp Venom Anaphylaxis   Apricot Flavoring Agent (Non-Screening) Swelling and Other (See Comments)    Eyes swell shut   Atorvastatin Itching and Swelling    Eye swelling and headaches   Losartan Itching and Rash   Family History  Problem Relation Age of Onset   Heart disease Mother    Stroke Mother    Cancer Father        Prostate   Prior to Admission medications   Medication Sig Start Date End Date Taking? Authorizing Provider  acetaminophen  (TYLENOL ) 500 MG tablet Take 500-1,000 mg by mouth every 8 (eight) hours as needed (for pain or headaches).    [provider]  albuterol  (VENTOLIN  HFA) 108 (90 Base) MCG/ACT inhaler Inhale 2 puffs into the lungs every 4 (four)  hours as needed for wheezing or shortness of breath. 05/30/22   Ghimire, Donalda HERO, MD  amLODipine  (NORVASC ) 10 MG tablet Take 10 mg by mouth daily.    [provider]  apixaban  (ELIQUIS ) 5 MG TABS tablet Take 1 tablet (5 mg total) by mouth 2 (two) times daily. 06/23/22   Camnitz, Soyla Lunger, MD  budesonide -formoterol  (SYMBICORT ) 160-4.5 MCG/ACT inhaler Inhale 2 puffs into the lungs 2 (two) times daily. Patient taking differently: Inhale 2 puffs into the lungs 2 (two) times daily as needed (asthma). 05/30/22   Ghimire, Donalda HERO, MD  celecoxib  (CELEBREX ) 100 MG capsule Take 1 capsule (100 mg total) by mouth 2 (two) times daily. Take with food 06/21/23 06/20/24  Patti Rosina SAUNDERS, PA-C  cephALEXin  (KEFLEX ) 500 MG capsule Take 1 capsule (500 mg total) by mouth 4 (four) times daily. 08/01/23   Armenta Canning, MD  Continuous Glucose Sensor (DEXCOM G7 SENSOR) MISC Inject 1 Device into the skin See admin instructions. Place 1 new sensor into the skin every 10 days Patient not taking: Reported on 05/15/2023    [provider]  glimepiride  (AMARYL ) 2 MG tablet Take 2 mg by mouth daily with breakfast.     [provider]  JARDIANCE  10 MG TABS tablet Take 10 mg by mouth daily. 12/09/22   [provider]  meclizine  (ANTIVERT ) 25 MG tablet Take 25 mg by mouth 2 (two) times daily as needed for dizziness. 09/09/22   [provider]  memantine  (NAMENDA ) 5 MG tablet Take 5 mg by mouth in the morning.    [provider]  metFORMIN  (GLUCOPHAGE ) 500 MG tablet Take 500 mg by mouth daily with breakfast. 12/09/22   [provider]  methocarbamol  (ROBAXIN ) 500 MG tablet Take 1 tablet (500 mg total) by mouth every 6 (six) hours as needed for muscle spasms. 06/14/23   Patti Rosina SAUNDERS, PA-C  Multiple Vitamins-Minerals (CENTRUM SILVER 50+WOMEN) TABS Take 1 tablet by mouth daily with breakfast.    [provider]  olmesartan  (BENICAR ) 20 MG tablet Take 20 mg by mouth daily. 03/29/22   [provider]  ondansetron  (ZOFRAN -ODT) 4 MG disintegrating tablet Take 1 tablet (4 mg total) by mouth every 4 (four) hours as needed for nausea or vomiting. 08/01/23   Armenta Canning, MD  oxyCODONE -acetaminophen  (PERCOCET) 5-325 MG tablet Take 1 tablet by mouth every 4 (four) hours as needed. 08/01/23   Armenta Canning, MD  pantoprazole  (PROTONIX ) 20 MG tablet Take 1 tablet (20 mg total) by mouth daily. 06/21/23 06/20/24  Patti Rosina SAUNDERS, PA-C  polyethylene glycol (MIRALAX  / GLYCOLAX ) 17 g packet Take 17 g by mouth 2 (two) times daily. 06/14/23   Patti Rosina SAUNDERS, PA-C  rOPINIRole  (REQUIP ) 4 MG tablet Take 4 mg by mouth at bedtime. Take 4 mg by mouth at 8 PM    [provider]  rosuvastatin  (CRESTOR ) 10 MG tablet Take 1 tablet (10 mg total) by mouth daily. 12/07/22   Thukkani, Arun K, MD                                                                                 Vitals:   12/09/23 2200 12/09/23 2220  12/09/23 2245 12/09/23 2315  BP: (!) 187/102 (!) 187/99 (!) 168/84 135/80  Pulse: 62 60 (!) 59 62  Resp: (!) 22 18 16 20   Temp:      TempSrc:      SpO2: 96% 97% 96%  97%  Weight:      Height:       Physical Exam Vitals reviewed.  Constitutional:      General: She is not in acute distress.    Appearance: She is not ill-appearing.  HENT:     Head: Normocephalic.  Eyes:     Extraocular Movements: Extraocular movements intact.  Cardiovascular:     Rate and Rhythm: Normal rate and regular rhythm.     Heart sounds: Normal heart sounds.  Pulmonary:     Breath sounds: Normal breath sounds.  Abdominal:     General: There is no distension.     Palpations: Abdomen is soft.     Tenderness: There is no abdominal tenderness.  Neurological:     General: No focal deficit present.     Mental Status: She is alert and oriented to person, place, and time.     Labs on Admission: I have personally reviewed following labs and imaging studies CBC: Recent Labs  Lab 12/09/23 2047  WBC 8.5  NEUTROABS 5.5  HGB 14.7  HCT 43.8  MCV 90.1  PLT 194   Basic Metabolic Panel: Recent Labs  Lab 12/09/23 2047  NA 138  K 3.5  CL 102  CO2 21*  GLUCOSE 190*  BUN 17  CREATININE 1.20*  CALCIUM  9.9   GFR: Estimated Creatinine Clearance: 45.4 mL/min (A) (by C-G formula based on SCr of 1.2 mg/dL (H)). Liver Function Tests: Recent Labs  Lab 12/09/23 2047  AST 22  ALT 22  ALKPHOS 66  BILITOT 0.8  PROT 6.0*  ALBUMIN 4.0   No results for input(s): LIPASE, AMYLASE in the last 168 hours. No results for input(s): AMMONIA in the last 168 hours. Recent Labs    04/13/23 0658 04/14/23 0552 04/15/23 0506 06/01/23 1018 06/14/23 0349 08/01/23 1902 12/09/23 2047  BUN 16 23 20 17 15 19 17   CREATININE 1.00 1.41* 0.89 0.64 0.67 1.07* 1.20*    Cardiac Enzymes: No results for input(s): CKTOTAL, CKMB, CKMBINDEX, TROPONINI in the last 168 hours. BNP (last 3 results) No results for input(s): PROBNP in the last 8760 hours. HbA1C: No results for input(s): HGBA1C in the last 72 hours. CBG: No results for input(s): GLUCAP in the last 168  hours. Lipid Profile: No results for input(s): CHOL, HDL, LDLCALC, TRIG, CHOLHDL, LDLDIRECT in the last 72 hours. Thyroid  Function Tests: No results for input(s): TSH, T4TOTAL, FREET4, T3FREE, THYROIDAB in the last 72 hours. Anemia Panel: No results for input(s): VITAMINB12, FOLATE, FERRITIN, TIBC, IRON, RETICCTPCT in the last 72 hours. Urine analysis:    Component Value Date/Time   COLORURINE YELLOW 08/01/2023 2205   APPEARANCEUR CLEAR 08/01/2023 2205   APPEARANCEUR Clear 02/16/2022 1316   LABSPEC 1.025 08/01/2023 2205   PHURINE 5.0 08/01/2023 2205   GLUCOSEU 50 (A) 08/01/2023 2205   HGBUR LARGE (A) 08/01/2023 2205   BILIRUBINUR NEGATIVE 08/01/2023 2205   BILIRUBINUR Negative 02/16/2022 1316   KETONESUR 20 (A) 08/01/2023 2205   PROTEINUR 30 (A) 08/01/2023 2205   NITRITE NEGATIVE 08/01/2023 2205   LEUKOCYTESUR NEGATIVE 08/01/2023 2205   Radiological Exams on Admission: DG Chest 2 View Result Date: 12/09/2023 EXAM: 2 VIEW(S) XRAY OF THE CHEST 12/09/2023 09:11:00 PM COMPARISON: None available. CLINICAL  HISTORY: cp FINDINGS: LINES, TUBES AND DEVICES: TAVR noted. Left chest pacemaker with single lead terminating in right ventricle. LUNGS AND PLEURA: No focal pulmonary opacity. No pleural effusion. HEART AND MEDIASTINUM: Mild cardiomegaly. BONES AND SOFT TISSUES: Multilevel thoracic osteophytosis. IMPRESSION: 1. No acute cardiopulmonary abnormality. Electronically signed by: Oneil Devonshire MD 12/09/2023 09:21 PM EST RP Workstation: [id number]   Data Reviewed: Relevant notes from primary care and specialist visits, past discharge summaries as available in EHR, including Care Everywhere . Prior diagnostic testing as pertinent to current admission diagnoses, Updated medications and problem lists for reconciliation .ED course, including vitals, labs, imaging, treatment and response to treatment,Triage notes, nursing and pharmacy notes and ED provider's notes.Notable  results as noted in HPI.Discussed case with EDMD/ ED APP/ or Specialty MD on call and as needed.  Assessment & Plan  78 year old female coming in with midsternal chest pain and high risk for CAD, patient has a history of severe AAS status post TAVR, tachybradycardia syndrome status post pacemaker,  >> Chest pain/NSTEMI: Will admit to telemetry unit with continuous cardiac monitoring. Continue patient on heparin  gtt. and hold Eliquis . Continue patient on statin therapy, Crestor . N.p.o. after midnight.  2D echocardiogram with bubble study. Appreciate cardiology consult.  Panel, TFTs.  >>Essential HTN: Pt started on NTG drip.  >> Diabetes mellitus type 2/obesity: Glycemic protocol every 4 hourly as patient is n.p.o.  A1c.   >> PAF: Heparin  gtt. currently.   >> Chronic HFpEF: 2D echocardiogram, strict I's and O's.   >> AKI: Lab Results  Component Value Date   CREATININE 1.20 (H) 12/09/2023   CREATININE 1.07 (H) 08/01/2023   CREATININE 0.67 06/14/2023  Stable will monitor.  Low rate IVF as pt will likely receive contrast.   DVT prophylaxis:  Heparin  GTT Consults:  Cardiology  Advance Care Planning:    Code Status: Prior   Family Communication:  None Disposition Plan:  Home Severity of Illness: The appropriate patient status for this patient is INPATIENT. Inpatient status is judged to be reasonable and necessary in order to provide the required intensity of service to ensure the patient's safety. The patient's presenting symptoms, physical exam findings, and initial radiographic and laboratory data in the context of their chronic comorbidities is felt to place them at high risk for further clinical deterioration. Furthermore, it is not anticipated that the patient will be medically stable for discharge from the hospital within 2 midnights of admission.   * I certify that at the point of admission it is my clinical judgment that the patient will require inpatient hospital  care spanning beyond 2 midnights from the point of admission due to high intensity of service, high risk for further deterioration and high frequency of surveillance required.*  Unresulted Labs (From admission, onward)     Start     Ordered   12/10/23 0800  Heparin  level (unfractionated)  Once-Timed,   URGENT        12/09/23 2338   12/10/23 0800  APTT  Once-Timed,   STAT        12/09/23 2338   12/10/23 0500  Lipid panel  Tomorrow morning,   R        12/09/23 2342   12/10/23 0500  CBC  Tomorrow morning,   R        12/09/23 2342   12/10/23 0500  Basic metabolic panel  Tomorrow morning,   R        12/09/23 2342   12/09/23 2342  TSH  Once,  R        12/09/23 2342   12/09/23 2342  T4, free  Once,   R        12/09/23 2342   12/09/23 2334  Hemoglobin A1c  Once,   URGENT       Comments: To assess prior glycemic control    12/09/23 2334            Meds ordered this encounter  Medications   amLODipine  (NORVASC ) tablet 10 mg   nitroGLYCERIN  50 mg in dextrose  5 % 250 mL (0.2 mg/mL) infusion   albuterol  (PROVENTIL ) (2.5 MG/3ML) 0.083% nebulizer solution 2.5 mg   amLODipine  (NORVASC ) tablet 10 mg   memantine  (NAMENDA ) tablet 5 mg   irbesartan  (AVAPRO ) tablet 150 mg   rosuvastatin  (CRESTOR ) tablet 10 mg   insulin  aspart (novoLOG ) injection 0-15 Units    Correction coverage::   Moderate (average weight, post-op)    CBG < 70::   Implement Hypoglycemia Standing Orders and refer to Hypoglycemia Standing Orders sidebar report    CBG 70 - 120::   0 units    CBG 121 - 150::   2 units    CBG 151 - 200::   3 units    CBG 201 - 250::   5 units    CBG 251 - 300::   8 units    CBG 301 - 350::   11 units    CBG 351 - 400::   15 units    CBG > 400:   call MD and obtain STAT lab verification   hydrALAZINE  (APRESOLINE ) injection 10 mg   heparin  ADULT infusion 100 units/mL (25000 units/268mL)   sodium chloride  flush (NS) 0.9 % injection 3 mL   sodium chloride  flush (NS) 0.9 % injection 3 mL    0.9 %  sodium chloride  infusion   aspirin  EC tablet 81 mg   nitroGLYCERIN  (NITROSTAT ) SL tablet 0.4 mg   acetaminophen  (TYLENOL ) tablet 650 mg   ondansetron  (ZOFRAN ) injection 4 mg     Orders Placed This Encounter  Procedures   DG Chest 2 View   CBC with Differential   Comprehensive metabolic panel   Hemoglobin A1c   Heparin  level (unfractionated)   APTT   TSH   T4, free   Lipid panel   CBC   Basic metabolic panel   Diet NPO time specified   Document Height and Actual Weight   Re-check Vital Signs   Apply Diabetes Mellitus Care Plan   STAT CBG when hypoglycemia is suspected. If treated, recheck every 15 minutes after each treatment until CBG >/= 70 mg/dl   Refer to Hypoglycemia Protocol Sidebar Report for treatment of CBG < 70 mg/dl   Vital signs   Cardiac monitoring   Notify physician (specify) Notify physician for pulse less than 50 or greater than 100, systolic BP less than 100 or greater than 160 ( before calling MD verify blood pressure manually), ongoing or recurrent chest pain.   Refer to Sidebar Report Mobility Protocol for Adult Inpatient   Apply Angina, Rule Out Myocardial Infarction Care Plan   Insert and maintain IV access or saline lock   Patient Education:   If diabetic or glucose greater than 140 mg/dL, notify MD for Glycemic Control (SSI) Order Set   Patient education per clinical pathway   Initiate Oral Care Protocol   Initiate Carrier Fluid Protocol   Nurse to provide smoking / tobacco cessation education   Beta blocker already ordered   RN  may order Cardiology PRN Orders utilizing Cardiology PRN medications (through manage orders) for the following patient needs:   Bed rest   Full code   Inpatient consult to Cardiology   Consult to hospitalist   heparin  per pharmacy consult   Oxygen therapy Mode or (Route): Nasal cannula   EKG 12-Lead   EKG 12-lead   EKG 12-Lead   ECHOCARDIOGRAM COMPLETE   Admit to Inpatient (patient's expected length of stay  will be greater than 2 midnights or inpatient only procedure)   Aspiration precautions   Fall precautions    Author: Mario LULLA Blanch, MD 12 pm- 8 pm. [facility]. 12/09/2023 11:42 PM Please note for any communication after hours contact [facility] Assigned provider on call on Amion.

## 2023-12-10 ENCOUNTER — Inpatient Hospital Stay (HOSPITAL_COMMUNITY)

## 2023-12-10 DIAGNOSIS — R079 Chest pain, unspecified: Secondary | ICD-10-CM

## 2023-12-10 DIAGNOSIS — I161 Hypertensive emergency: Secondary | ICD-10-CM | POA: Diagnosis not present

## 2023-12-10 DIAGNOSIS — I214 Non-ST elevation (NSTEMI) myocardial infarction: Secondary | ICD-10-CM | POA: Diagnosis not present

## 2023-12-10 LAB — ECHOCARDIOGRAM COMPLETE
AR max vel: 1.01 cm2
AV Area VTI: 1 cm2
AV Area mean vel: 0.98 cm2
AV Mean grad: 19 mmHg
AV Peak grad: 36.5 mmHg
Ao pk vel: 3.02 m/s
Area-P 1/2: 1.75 cm2
Height: 68 in
S' Lateral: 2.7 cm
Weight: 2934.4 [oz_av]

## 2023-12-10 LAB — GLUCOSE, CAPILLARY
Glucose-Capillary: 110 mg/dL — ABNORMAL HIGH (ref 70–99)
Glucose-Capillary: 113 mg/dL — ABNORMAL HIGH (ref 70–99)
Glucose-Capillary: 156 mg/dL — ABNORMAL HIGH (ref 70–99)
Glucose-Capillary: 206 mg/dL — ABNORMAL HIGH (ref 70–99)
Glucose-Capillary: 203 mg/dL — ABNORMAL HIGH (ref 70–99)
Glucose-Capillary: 111 mg/dL — ABNORMAL HIGH (ref 70–99)

## 2023-12-10 LAB — CBC
HCT: 39.4 % (ref 36.0–46.0)
Hemoglobin: 13.4 g/dL (ref 12.0–15.0)
MCH: 30.7 pg (ref 26.0–34.0)
MCHC: 34 g/dL (ref 30.0–36.0)
MCV: 90.4 fL (ref 80.0–100.0)
Platelets: 172 K/uL (ref 150–400)
RBC: 4.36 MIL/uL (ref 3.87–5.11)
RDW: 13 % (ref 11.5–15.5)
WBC: 7.7 K/uL (ref 4.0–10.5)
nRBC: 0 % (ref 0.0–0.2)

## 2023-12-10 LAB — LIPID PANEL
Cholesterol: 173 mg/dL (ref 0–200)
HDL: 39 mg/dL — ABNORMAL LOW (ref >40)
LDL Cholesterol: 116 mg/dL — ABNORMAL HIGH (ref 0–99)
Total CHOL/HDL Ratio: 4.4 ratio
Triglycerides: 90 mg/dL (ref <150)
VLDL: 18 mg/dL (ref 0–40)

## 2023-12-10 LAB — BASIC METABOLIC PANEL WITH GFR
Anion gap: 16 — ABNORMAL HIGH (ref 5–15)
BUN: 19 mg/dL (ref 8–23)
CO2: 17 mmol/L — ABNORMAL LOW (ref 22–32)
Calcium: 9.2 mg/dL (ref 8.9–10.3)
Chloride: 105 mmol/L (ref 98–111)
Creatinine, Ser: 0.98 mg/dL (ref 0.44–1.00)
GFR, Estimated: >60 mL/min (ref >60)
Glucose, Bld: 109 mg/dL — ABNORMAL HIGH (ref 70–99)
Potassium: 3.5 mmol/L (ref 3.5–5.1)
Sodium: 138 mmol/L (ref 135–145)

## 2023-12-10 LAB — HEMOGLOBIN A1C
Hgb A1c MFr Bld: 6.8 % — ABNORMAL HIGH (ref 4.8–5.6)
Mean Plasma Glucose: 148.46 mg/dL

## 2023-12-10 LAB — TROPONIN I (HIGH SENSITIVITY): Troponin I (High Sensitivity): 9246 ng/L (ref <18)

## 2023-12-10 LAB — APTT
aPTT: 166 s (ref 24–36)
aPTT: 116 s — ABNORMAL HIGH (ref 24–36)

## 2023-12-10 LAB — HEPARIN LEVEL (UNFRACTIONATED): Heparin Unfractionated: >1.1 [IU]/mL — ABNORMAL HIGH (ref 0.30–0.70)

## 2023-12-10 LAB — T4, FREE: Free T4: 0.78 ng/dL (ref 0.61–1.12)

## 2023-12-10 LAB — TSH: TSH: 1.025 u[IU]/mL (ref 0.350–4.500)

## 2023-12-10 MED ORDER — SODIUM CHLORIDE 0.9 % IV SOLN: INTRAVENOUS | Status: AC

## 2023-12-10 MED ORDER — HEPARIN (PORCINE) 25000 UT/250ML-% IV SOLN
1000.0000 [IU]/h | INTRAVENOUS | Status: DC
  Administered 2023-12-10: 1000 [IU]/h via INTRAVENOUS
  Filled 2023-12-10: qty 250

## 2023-12-10 MED ORDER — MORPHINE SULFATE (PF) 2 MG/ML IV SOLN
2.0000 mg | INTRAVENOUS | Status: DC | PRN
Start: 2023-12-10 — End: 2023-12-12
  Filled 2023-12-10: qty 1

## 2023-12-10 MED ORDER — HEPARIN (PORCINE) 25000 UT/250ML-% IV SOLN
800.0000 [IU]/h | INTRAVENOUS | Status: DC
  Administered 2023-12-10: 800 [IU]/h via INTRAVENOUS

## 2023-12-10 NOTE — Progress Notes (Addendum)
 PHARMACY - ANTICOAGULATION CONSULT NOTE  Pharmacy Consult for heparin  Indication: chest pain/ACS, afib  Allergies  Allergen Reactions   Bee Venom Shortness Of Breath and Rash   Claritin  [Loratadine ] Itching and Palpitations   Wasp Venom Anaphylaxis   Apricot Flavoring Agent (Non-Screening) Swelling and Other (See Comments)    Eyes swell shut   Atorvastatin Itching and Swelling    Eye swelling and headaches   Losartan Itching and Rash    Patient Measurements: Height: 5' 8 (172.7 cm) Weight: 83.2 kg (183 lb 6.4 oz) IBW/kg (Calculated) : 63.9 HEPARIN  DW (KG): 80.9  Vital Signs: Temp: 97.7 F (36.5 C) (11/09 0751) Temp Source: Oral (11/09 0751) BP: 128/74 (11/09 0540) Pulse Rate: 62 (11/09 0540)  Labs: Recent Labs    12/09/23 2047 12/09/23 2229 12/10/23 0747  HGB 14.7  --  13.4  HCT 43.8  --  39.4  PLT 194  --  172  APTT  --   --  166*  HEPARINUNFRC  --   --  >1.10*  CREATININE 1.20*  --  0.98  TROPONINIHS 81* 459*  --     Estimated Creatinine Clearance: 56.1 mL/min (by C-G formula based on SCr of 0.98 mg/dL).   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  Verlin & Dr. Dusty   Severe aortic stenosis    Type 2 diabetes mellitus without complication, without long-term current use of insulin  (HCC) 04/10/2018   Vitamin D  deficiency    Assessment: 68 yoF presented with chest tightness. Pharmacy consulted to dose heparin  for ACS. PMH: HTN, severe AS s/p TAVR (2022), tachy/brady syndrome s/p PPM, afib (eliquis ), CKD, T2DM  -Last dose of eliquis  11/8 @0930  will utilize aPTTs for heparin  monitoring given anti-Xa level will be falsely elevated due to apixaban  -CBC WNL -Trop 81 > 459  Heparin  level (>1.1) and aPTT elevated (166sec). CBC stable: Hgb 13.4, plt 172.  Nurse instructed to hold heparin  for 1h; heparin  was not stopped until around 1115. Will restart at a lower rate and recheck later this afternoon.  Goal of Therapy:  Heparin  level 0.3-0.7 units/ml aPTT 66-102 seconds Monitor platelets by anticoagulation protocol: Yes   Plan:  Hold heparin  for 1h Restart heparin  infusion at 1000 units/hr Check aPTT and anti-Xa level in 8 hours and daily while on heparin  Monitor with aPTTs until correlates with anti-Xa level Continue to monitor H&H and platelets F/u TTE in AM and plans for [facility]  Elma Fail, PharmD PGY1 Clinical Pharmacist  [facility]  12/10/2023 10:37 AM

## 2023-12-10 NOTE — Plan of Care (Signed)

## 2023-12-10 NOTE — Progress Notes (Signed)
 PHARMACY - ANTICOAGULATION CONSULT NOTE  Pharmacy Consult for heparin  Indication: chest pain/ACS, afib  Allergies  Allergen Reactions   Bee Venom Shortness Of Breath and Rash   Claritin  [Loratadine ] Itching and Palpitations   Wasp Venom Anaphylaxis   Apricot Flavoring Agent (Non-Screening) Swelling and Other (See Comments)    Eyes swell shut   Atorvastatin Itching and Swelling    Eye swelling and headaches   Losartan Itching and Rash    Patient Measurements: Height: 5' 8 (172.7 cm) Weight: 83.2 kg (183 lb 6.4 oz) IBW/kg (Calculated) : 63.9 HEPARIN  DW (KG): 80.9  Vital Signs: Temp: 97.9 F (36.6 C) (11/09 2019) Temp Source: Oral (11/09 2019) BP: 101/70 (11/09 2019) Pulse Rate: 64 (11/09 2019)  Labs: Recent Labs    12/09/23 2047 12/09/23 2229 12/10/23 0747 12/10/23 0954 12/10/23 1938  HGB 14.7  --  13.4  --   --   HCT 43.8  --  39.4  --   --   PLT 194  --  172  --   --   APTT  --   --  166*  --  116*  HEPARINUNFRC  --   --  >1.10*  --   --   CREATININE 1.20*  --  0.98  --   --   TROPONINIHS 81* 459*  --  9,246*  --     Estimated Creatinine Clearance: 56.1 mL/min (by C-G formula based on SCr of 0.98 mg/dL).   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 Verlin & Dr. Dusty   Severe aortic stenosis    Type 2 diabetes mellitus without complication, without long-term current use of insulin  (HCC) 04/10/2018   Vitamin D  deficiency    Assessment: 27 yoF presented with chest tightness. Pharmacy consulted to dose heparin  for ACS. PMH: HTN, severe AS s/p TAVR (2022), tachy/brady syndrome s/p PPM, afib (eliquis ), CKD, T2DM  -Last dose of eliquis  11/8 @0930  will utilize aPTTs for heparin  monitoring given anti-Xa level will be falsely elevated due to apixaban  -CBC WNL -Trop 81 > 459  PTT came back elevated again this PM. No bleeding per Rn. We will hold for a short duration then decrease rate.   Goal of Therapy:  Heparin  level 0.3-0.7 units/ml aPTT 66-102 seconds Monitor platelets by anticoagulation protocol: Yes   Plan:  Hold heparin  for 30 minutes Decrease heparin  infusion 800 units/hr Check aPTT and anti-Xa level in 8 hours and daily while on heparin  Monitor with aPTTs until  correlates with anti-Xa level Continue to monitor H&H and platelets F/u TTE in AM and plans for [facility], PharmD, [place], AAHIVP, CPP Infectious Disease Pharmacist 12/10/2023 8:28 PM

## 2023-12-10 NOTE — Progress Notes (Signed)
 [facility] PROGRESS NOTE    Progress Note  Briana Foster  FMW:[id number] DOB: Sep 21, 1945 DOA: 12/09/2023 PCP: Ransom Other, MD     Brief Narrative:   Briana Foster is an 78 y.o. female past medical history significant for persistent atrial fibrillation on Eliquis , chronic HFpEF, essential hypertension aortic stenosis status post TAVR's, uncontrolled diabetes mellitus type 2 rheumatoid arthritis not on immunosuppressive, advanced dementia therapy comes in with chest pain midsternal nonradiating EMS found to have her with a blood pressure 180/100 Assessment/Plan:   Elevated troponins/chest pain: Cardiac biomarkers delta more than 100%.  Twelve-lead EKG shows A-fib paced rhythm prolonged QTc. Cardiology was consulted recommended to follow next troponin check a 2D echo to check for wall motion abnormality.. Continue IV nitro, IV heparin , aspirin , statins and not a candidate for beta-blocker due to bradycardia. She had a previous left heart cath in 2022 as patent with TAVR's I am not sure if she had coronary angiogram.  Hypertensive urgency/essential hypertension: Started on nitroglycerin  drip, continue Norvasc , and Avapro . Blood pressure is improved this morning.  Acute kidney injury: With a baseline creatinine of less than 1 on admission 1.2. Started on gentle IV fluids as she is n.p.o. repeat basic metabolic panel in the morning.  Diabetes mellitus type 2: Currently n.p.o. continue sliding scale insulin . A1c is 0.8.  Paroxysmal atrial fibrillation: Rate controlled around 60. Not on a AV nodal blocking agent.  Chronic HFpEF: 2D echo earlier this year showed an EF of 65% mild concentric hypertrophy.  Dilated left atrium and right atrium.  Mitral valve regurgitation with unknown TAVR's.   DVT prophylaxis: heparin  Family Communication:none Status is: Inpatient Remains inpatient appropriate because: Elevated troponins    Code Status:     Code Status Orders   (From admission, onward)           Start     Ordered   12/09/23 2341  Full code  Continuous       Question:  By:  Answer:  Other   12/09/23 2342           Code Status History     Date Active Date Inactive Code Status Order ID Comments User Context   06/13/2023 1752 06/21/2023 2213 Full Code [id number]  Patti Rosina JONELLE DEVONNA Inpatient   04/13/2023 1235 04/15/2023 2221 Full Code [id number]  Arlice Reichert, MD ED   05/22/2022 1403 05/30/2022 1732 Full Code [id number]  Laurita Cort DASEN, MD ED   02/01/2022 0529 02/03/2022 1840 Full Code [id number]  Marcene Eva KATHEE, DO ED   01/26/2022 1343 01/28/2022 1917 Full Code [id number]  Barbarann Nest, MD ED   11/07/2021 1346 11/09/2021 2148 Full Code [id number]  Laurita Cort DASEN, MD ED   11/04/2021 1425 11/06/2021 1833 Full Code [id number]  Laurita Cort DASEN, MD ED   07/14/2021 1419 07/14/2021 2243 Full Code [id number]  Inocencio Soyla Lunger, MD Inpatient   10/27/2020 1926 10/29/2020 1729 Full Code [id number]  Mona Vinie BROCKS, MD ED   06/30/2020 1646 07/01/2020 1742 Full Code [id number]  Sebastian Lamarr JONELLE, PA-C Inpatient   06/21/2020 0824 06/24/2020 2240 Full Code [id number]  Maryelizabeth Sos, NP ED   06/17/2020 0947 06/17/2020 1715 Full Code [id number]  Claudene Victory ORN, MD Inpatient         IV Access:   Peripheral IV   Procedures and diagnostic studies:   DG Chest 2 View Result Date: 12/09/2023 EXAM: 2 VIEW(S) XRAY OF THE CHEST 12/09/2023 09:11:00 PM COMPARISON: None available. CLINICAL HISTORY: cp FINDINGS: LINES,  TUBES AND DEVICES: TAVR noted. Left chest pacemaker with single lead terminating in right ventricle. LUNGS AND PLEURA: No focal pulmonary opacity. No pleural effusion. HEART AND MEDIASTINUM: Mild cardiomegaly. BONES AND SOFT TISSUES: Multilevel thoracic osteophytosis. IMPRESSION: 1. No acute cardiopulmonary abnormality. Electronically signed by: Oneil Devonshire MD 12/09/2023 09:21 PM EST RP Workstation: [id number]     Medical Consultants:   None.   Subjective:     Briana Foster denies any chest pain she relates she is hungry.  Objective:    Vitals:   12/09/23 2341 12/09/23 2345 12/10/23 0031 12/10/23 0540  BP:  (!) 153/72 (!) 152/80 128/74  Pulse:  (!) 59 62 62  Resp:  19 20 18   Temp:   97.9 F (36.6 C) 97.9 F (36.6 C)  TempSrc:   Oral Oral  SpO2: 97% 96% 97% 97%  Weight:   83.2 kg   Height:   5' 8 (1.727 m)    SpO2: 97 % O2 Flow Rate (L/min): 0 L/min  No intake or output data in the 24 hours ending 12/10/23 0612 Filed Weights   12/09/23 24-Dec-2035 12/10/23 0031  Weight: 81.6 kg 83.2 kg    Exam: General exam: In no acute distress. Respiratory system: Good air movement and clear to auscultation. Cardiovascular system: S1 & S2 heard, RRR. No JVD, murmurs, rubs, gallops or clicks.  Gastrointestinal system: Abdomen is nondistended, soft and nontender.  Central nervous system: Alert and oriented. No focal neurological deficits. Extremities: No pedal edema. Skin: No rashes, lesions or ulcers Psychiatry: Judgement and insight appear normal. Mood & affect appropriate.    Data Reviewed:    Labs: Basic Metabolic Panel: Recent Labs  Lab 12/09/23 12/23/45  NA 138  K 3.5  CL 102  CO2 21*  GLUCOSE 190*  BUN 17  CREATININE 1.20*  CALCIUM  9.9   GFR Estimated Creatinine Clearance: 45.8 mL/min (A) (by C-G formula based on SCr of 1.2 mg/dL (H)). Liver Function Tests: Recent Labs  Lab 12/09/23 2045/12/23  AST 22  ALT 22  ALKPHOS 66  BILITOT 0.8  PROT 6.0*  ALBUMIN 4.0   No results for input(s): LIPASE, AMYLASE in the last 168 hours. No results for input(s): AMMONIA in the last 168 hours. Coagulation profile No results for input(s): INR, PROTIME in the last 168 hours. COVID-19 Labs  No results for input(s): DDIMER, FERRITIN, LDH, CRP in the last 72 hours.  Lab Results  Component Value Date   SARSCOV2NAA NEGATIVE 05/22/2022   SARSCOV2NAA POSITIVE (A) 01/26/2022   SARSCOV2NAA NEGATIVE 10/27/2020    SARSCOV2NAA NEGATIVE 06/26/2020    CBC: Recent Labs  Lab 12/09/23 12/23/2045  WBC 8.5  NEUTROABS 5.5  HGB 14.7  HCT 43.8  MCV 90.1  PLT 194   Cardiac Enzymes: No results for input(s): CKTOTAL, CKMB, CKMBINDEX, TROPONINI in the last 168 hours. BNP (last 3 results) No results for input(s): PROBNP in the last 8760 hours. CBG: Recent Labs  Lab 12/09/23 2351 12/10/23 0535  GLUCAP 264* 110*   D-Dimer: No results for input(s): DDIMER in the last 72 hours. Hgb A1c: Recent Labs    12/09/23 12-23-45  HGBA1C 6.8*   Lipid Profile: No results for input(s): CHOL, HDL, LDLCALC, TRIG, CHOLHDL, LDLDIRECT in the last 72 hours. Thyroid  function studies: Recent Labs    12/09/23 12-24-2227  TSH 1.025   Anemia work up: No results for input(s): VITAMINB12, FOLATE, FERRITIN, TIBC, IRON, RETICCTPCT in the last 72 hours. Sepsis Labs: Recent Labs  Lab 12/09/23 23-Dec-2045  WBC  8.5   Microbiology No results found for this or any previous visit (from the past 240 hours).   Medications:    amLODipine   10 mg Oral Daily   aspirin  EC  81 mg Oral Daily   insulin  aspart  0-15 Units Subcutaneous Q4H   irbesartan   150 mg Oral Daily   memantine   5 mg Oral BID   rosuvastatin   10 mg Oral Daily   sodium chloride  flush  3 mL Intravenous Q12H   Continuous Infusions:  sodium chloride      heparin  1,200 Units/hr (12/09/23 2356)   nitroGLYCERIN  5 mcg/min (12/09/23 2232)      LOS: 1 day   Erle Odell Castor  [facility]  12/10/2023, 6:12 AM

## 2023-12-10 NOTE — Progress Notes (Signed)
 Echocardiogram 2D Echocardiogram has been performed.  Briana Foster 12/10/2023, 11:37 AM

## 2023-12-10 NOTE — Progress Notes (Signed)
 Rounding Note   Patient Name: Briana Foster Date of Encounter: 12/10/2023  [facility] Cardiologist: Arun K Thukkani, MD   Subjective No complaints  Scheduled Meds:  amLODipine   10 mg Oral Daily   aspirin  EC  81 mg Oral Daily   insulin  aspart  0-15 Units Subcutaneous Q4H   irbesartan   150 mg Oral Daily   memantine   5 mg Oral BID   rosuvastatin   10 mg Oral Daily   sodium chloride  flush  3 mL Intravenous Q12H   Continuous Infusions:  sodium chloride      sodium chloride      heparin  1,200 Units/hr (12/10/23 0634)   nitroGLYCERIN  Stopped (12/10/23 0523)   PRN Meds: sodium chloride , acetaminophen , albuterol , hydrALAZINE , morphine  injection, nitroGLYCERIN , ondansetron  (ZOFRAN ) IV, sodium chloride  flush   Vital Signs  Vitals:   12/09/23 2345 12/10/23 0031 12/10/23 0540 12/10/23 0751  BP: (!) 153/72 (!) 152/80 128/74   Pulse: (!) 59 62 62   Resp: 19 20 18 17   Temp:  97.9 F (36.6 C) 97.9 F (36.6 C) 97.7 F (36.5 C)  TempSrc:  Oral Oral Oral  SpO2: 96% 97% 97%   Weight:  83.2 kg    Height:  5' 8 (1.727 m)      Intake/Output Summary (Last 24 hours) at 12/10/2023 0938 Last data filed at 12/10/2023 0634 Gross per 24 hour  Intake 87.38 ml  Output --  Net 87.38 ml      12/10/2023   12:31 AM 12/09/2023    8:37 PM 06/13/2023   12:23 PM  Last 3 Weights  Weight (lbs) 183 lb 6.4 oz 180 lb 195 lb  Weight (kg) 83.19 kg 81.647 kg 88.451 kg      Telemetry V paced - Personally Reviewed  ECG  N/a - Personally Reviewed  Physical Exam  GEN: No acute distress.   Neck: No JVD Cardiac: RRR, 2/6 systolic murmur rusb Respiratory: Clear to auscultation bilaterally. GI: Soft, nontender, non-distended  MS: No edema; No deformity. Neuro:  Nonfocal  Psych: Normal affect   Labs High Sensitivity Troponin:   Recent Labs  Lab 12/09/23 2047 12/09/23 2229  TROPONINIHS 81* 459*     Chemistry Recent Labs  Lab 12/09/23 2047 12/10/23 0747  NA 138 138  K 3.5  3.5  CL 102 105  CO2 21* 17*  GLUCOSE 190* 109*  BUN 17 19  CREATININE 1.20* 0.98  CALCIUM  9.9 9.2  PROT 6.0*  --   ALBUMIN 4.0  --   AST 22  --   ALT 22  --   ALKPHOS 66  --   BILITOT 0.8  --   GFRNONAA 47* >60  ANIONGAP 15 16*    Lipids  Recent Labs  Lab 12/10/23 0555  CHOL 173  TRIG 90  HDL 39*  LDLCALC 116*  CHOLHDL 4.4    Hematology Recent Labs  Lab 12/09/23 2047 12/10/23 0747  WBC 8.5 7.7  RBC 4.86 4.36  HGB 14.7 13.4  HCT 43.8 39.4  MCV 90.1 90.4  MCH 30.2 30.7  MCHC 33.6 34.0  RDW 12.9 13.0  PLT 194 172   Thyroid   Recent Labs  Lab 12/09/23 2229  TSH 1.025  FREET4 0.78    BNPNo results for input(s): BNP, PROBNP in the last 168 hours.  DDimer No results for input(s): DDIMER in the last 168 hours.   Radiology  DG Chest 2 View Result Date: 12/09/2023 EXAM: 2 VIEW(S) XRAY OF THE CHEST 12/09/2023 09:11:00 PM COMPARISON: None  available. CLINICAL HISTORY: cp FINDINGS: LINES, TUBES AND DEVICES: TAVR noted. Left chest pacemaker with single lead terminating in right ventricle. LUNGS AND PLEURA: No focal pulmonary opacity. No pleural effusion. HEART AND MEDIASTINUM: Mild cardiomegaly. BONES AND SOFT TISSUES: Multilevel thoracic osteophytosis. IMPRESSION: 1. No acute cardiopulmonary abnormality. Electronically signed by: Oneil Devonshire MD 12/09/2023 09:21 PM EST RP Workstation: [id number]      Patient Profile   Briana Foster is a 78 y.o. female with a hx of T2DM, HTN, severe AS s/p TAVR in 05/2020, carotid artery disease, tachy-brady syndrome s/p DC PPM, persistent AF on apixaban , CKD stage 3A, RA, and GERD who is being seen 12/09/2023 for the evaluation of chest tightness.   Assessment & Plan   1.Chest tightness - presented with severe HTN SBP>200 - trop up to 459 without established peak, EKG is v paced - 05/2020 cath: widely patent coronaries  -suspect symptoms and troponin elevation due to severe demand in setting of severe HTN. Symptoms resolved  with bp control - f/u repeat trop to establish peak - f/u echo - on heparin  for time being, partly for her afib in case invasive procedure is neccesary. Continue for now.   2.Hypertensive emeregency - SBPs>200, presented with signs of myocardial injury - started on NG gtt now off - she is on norvasc  10mg , irbesartan  150 - normotensive this morning.   3.PAF - eliquis  on hold in case invasive procedures required, she is on hep gtt    For questions or updates, please contact [facility] Please consult [email or web address] for contact info under       Signed, Alvan Carrier, MD  12/10/2023, 9:38 AM

## 2023-12-11 ENCOUNTER — Encounter (HOSPITAL_COMMUNITY)
Admission: EM | Disposition: A | Discharge status: Home / Self Care | Payer: Self-pay | Source: Home / Self Care | Attending: Internal Medicine

## 2023-12-11 ENCOUNTER — Encounter (HOSPITAL_COMMUNITY): Payer: Self-pay | Admitting: Internal Medicine

## 2023-12-11 DIAGNOSIS — Z952 Presence of prosthetic heart valve: Secondary | ICD-10-CM

## 2023-12-11 DIAGNOSIS — I249 Acute ischemic heart disease, unspecified: Secondary | ICD-10-CM

## 2023-12-11 DIAGNOSIS — I161 Hypertensive emergency: Secondary | ICD-10-CM | POA: Diagnosis not present

## 2023-12-11 DIAGNOSIS — I4819 Other persistent atrial fibrillation: Secondary | ICD-10-CM

## 2023-12-11 DIAGNOSIS — Z950 Presence of cardiac pacemaker: Secondary | ICD-10-CM

## 2023-12-11 DIAGNOSIS — Z8679 Personal history of other diseases of the circulatory system: Secondary | ICD-10-CM

## 2023-12-11 DIAGNOSIS — I214 Non-ST elevation (NSTEMI) myocardial infarction: Secondary | ICD-10-CM | POA: Diagnosis not present

## 2023-12-11 HISTORY — PX: LEFT HEART CATH AND CORONARY ANGIOGRAPHY: CATH118249

## 2023-12-11 LAB — BASIC METABOLIC PANEL WITH GFR
Anion gap: 11 (ref 5–15)
BUN: 24 mg/dL — ABNORMAL HIGH (ref 8–23)
CO2: 24 mmol/L (ref 22–32)
Calcium: 9.6 mg/dL (ref 8.9–10.3)
Chloride: 105 mmol/L (ref 98–111)
Creatinine, Ser: 1.11 mg/dL — ABNORMAL HIGH (ref 0.44–1.00)
GFR, Estimated: 52 mL/min — ABNORMAL LOW (ref >60)
Glucose, Bld: 122 mg/dL — ABNORMAL HIGH (ref 70–99)
Potassium: 3.9 mmol/L (ref 3.5–5.1)
Sodium: 140 mmol/L (ref 135–145)

## 2023-12-11 LAB — APTT
aPTT: 71 s — ABNORMAL HIGH (ref 24–36)
aPTT: 91 s — ABNORMAL HIGH (ref 24–36)

## 2023-12-11 LAB — GLUCOSE, CAPILLARY
Glucose-Capillary: 106 mg/dL — ABNORMAL HIGH (ref 70–99)
Glucose-Capillary: 129 mg/dL — ABNORMAL HIGH (ref 70–99)
Glucose-Capillary: 137 mg/dL — ABNORMAL HIGH (ref 70–99)
Glucose-Capillary: 120 mg/dL — ABNORMAL HIGH (ref 70–99)
Glucose-Capillary: 211 mg/dL — ABNORMAL HIGH (ref 70–99)

## 2023-12-11 LAB — HEPARIN LEVEL (UNFRACTIONATED)
Heparin Unfractionated: 0.52 [IU]/mL (ref 0.30–0.70)
Heparin Unfractionated: 0.48 [IU]/mL (ref 0.30–0.70)

## 2023-12-11 SURGERY — LEFT HEART CATH AND CORONARY ANGIOGRAPHY
Anesthesia: LOCAL

## 2023-12-11 MED ORDER — HEPARIN (PORCINE) IN NACL 1000-0.9 UT/500ML-% IV SOLN
INTRAVENOUS | Status: DC | PRN
  Administered 2023-12-11 (×2): 500 mL

## 2023-12-11 MED ORDER — SODIUM CHLORIDE 0.9% FLUSH: 3.0000 mL | INTRAVENOUS | Status: DC | PRN

## 2023-12-11 MED ORDER — HEPARIN SODIUM (PORCINE) 1000 UNIT/ML IJ SOLN
INTRAMUSCULAR | Status: DC | PRN
  Administered 2023-12-11: 5000 [IU] via INTRA_ARTERIAL

## 2023-12-11 MED ORDER — HYDRALAZINE HCL 20 MG/ML IJ SOLN: 10.0000 mg | INTRAMUSCULAR | Status: DC | PRN

## 2023-12-11 MED ORDER — SODIUM CHLORIDE 0.9% FLUSH
3.0000 mL | Freq: Two times a day (BID) | INTRAVENOUS | Status: DC
  Administered 2023-12-11 – 2023-12-12 (×2): 3 mL via INTRAVENOUS

## 2023-12-11 MED ORDER — HYDRALAZINE HCL 20 MG/ML IJ SOLN: 10.0000 mg | INTRAMUSCULAR | Status: AC | PRN

## 2023-12-11 MED ORDER — INSULIN ASPART 100 UNIT/ML IJ SOLN
0.0000 [IU] | Freq: Three times a day (TID) | INTRAMUSCULAR | Status: DC
  Administered 2023-12-12: 2 [IU] via SUBCUTANEOUS
  Filled 2023-12-11: qty 2

## 2023-12-11 MED ORDER — HEPARIN SODIUM (PORCINE) 1000 UNIT/ML IJ SOLN
INTRAMUSCULAR | Status: AC
  Filled 2023-12-11: qty 10

## 2023-12-11 MED ORDER — HEPARIN (PORCINE) IN NACL 2-0.9 UNITS/ML
INTRAMUSCULAR | Status: DC | PRN
  Administered 2023-12-11: 10 mL via INTRA_ARTERIAL

## 2023-12-11 MED ORDER — LABETALOL HCL 5 MG/ML IV SOLN: 10.0000 mg | INTRAVENOUS | Status: AC | PRN

## 2023-12-11 MED ORDER — ROSUVASTATIN CALCIUM 20 MG PO TABS
40.0000 mg | ORAL_TABLET | Freq: Every day | ORAL | Status: DC
  Administered 2023-12-11 – 2023-12-12 (×2): 40 mg via ORAL
  Filled 2023-12-11 (×2): qty 2

## 2023-12-11 MED ORDER — IOHEXOL 350 MG/ML SOLN
INTRAVENOUS | Status: DC | PRN
Start: 2023-12-11 — End: 2023-12-11
  Administered 2023-12-11: 40 mL

## 2023-12-11 MED ORDER — VERAPAMIL HCL 2.5 MG/ML IV SOLN
INTRAVENOUS | Status: AC
Start: 2023-12-11 — End: 2023-12-11
  Filled 2023-12-11: qty 2

## 2023-12-11 MED ORDER — ALUM & MAG HYDROXIDE-SIMETH 200-200-20 MG/5ML PO SUSP
30.0000 mL | ORAL | Status: DC | PRN
  Administered 2023-12-11: 30 mL via ORAL
  Filled 2023-12-11: qty 30

## 2023-12-11 MED ORDER — ROPINIROLE HCL 0.5 MG PO TABS
4.0000 mg | ORAL_TABLET | Freq: Every day | ORAL | Status: DC
  Administered 2023-12-11 (×2): 4 mg via ORAL
  Filled 2023-12-11 (×3): qty 8

## 2023-12-11 MED ORDER — FENTANYL CITRATE (PF) 100 MCG/2ML IJ SOLN
INTRAMUSCULAR | Status: DC | PRN
  Administered 2023-12-11: 25 ug via INTRAVENOUS

## 2023-12-11 MED ORDER — MIDAZOLAM HCL 2 MG/2ML IJ SOLN
INTRAMUSCULAR | Status: AC
  Filled 2023-12-11: qty 2

## 2023-12-11 MED ORDER — FREE WATER
500.0000 mL | Freq: Once | Status: AC
  Administered 2023-12-11: 500 mL via ORAL

## 2023-12-11 MED ORDER — LIDOCAINE HCL (PF) 1 % IJ SOLN
INTRAMUSCULAR | Status: DC | PRN
  Administered 2023-12-11: 5 mL

## 2023-12-11 MED ORDER — FENTANYL CITRATE (PF) 100 MCG/2ML IJ SOLN
INTRAMUSCULAR | Status: AC
  Filled 2023-12-11: qty 2

## 2023-12-11 MED ORDER — LIDOCAINE HCL (PF) 1 % IJ SOLN
INTRAMUSCULAR | Status: AC
  Filled 2023-12-11: qty 30

## 2023-12-11 MED ORDER — MIDAZOLAM HCL (PF) 2 MG/2ML IJ SOLN
INTRAMUSCULAR | Status: DC | PRN
  Administered 2023-12-11: 1 mg via INTRAVENOUS

## 2023-12-11 MED ORDER — SODIUM CHLORIDE 0.9 % IV SOLN
250.0000 mL | INTRAVENOUS | Status: DC | PRN
Start: 2023-12-11 — End: 2023-12-12

## 2023-12-11 SURGICAL SUPPLY — 6 items
CATH INFINITI 6F ANG MULTIPACK (CATHETERS) IMPLANT
DEVICE RAD COMP TR BAND LRG (VASCULAR PRODUCTS) IMPLANT
GLIDESHEATH SLEND SS 6F .021 (SHEATH) IMPLANT
PACK CARDIAC CATHETERIZATION (CUSTOM PROCEDURE TRAY) ×1 IMPLANT
SET ATX-X65L (MISCELLANEOUS) IMPLANT
WIRE EMERALD 3MM-J .035X260CM (WIRE) IMPLANT

## 2023-12-11 NOTE — Progress Notes (Signed)
 PHARMACY - ANTICOAGULATION CONSULT NOTE  Pharmacy Consult for heparin  Indication: chest pain/ACS, afib  Allergies  Allergen Reactions   Bee Venom Shortness Of Breath and Rash   Claritin  [Loratadine ] Itching and Palpitations   Wasp Venom Anaphylaxis   Apricot Flavoring Agent (Non-Screening) Swelling and Other (See Comments)    Eyes swell shut   Atorvastatin Itching and Swelling    Eye swelling and headaches   Losartan Itching and Rash    Patient Measurements: Height: 5' 8 (172.7 cm) Weight: 83.2 kg (183 lb 6.4 oz) IBW/kg (Calculated) : 63.9 HEPARIN  DW (KG): 80.9  Vital Signs: Temp: 97.3 F (36.3 C) (11/10 1156) Temp Source: Oral (11/10 1156) BP: 141/78 (11/10 1156) Pulse Rate: 62 (11/10 1156)  Labs: Recent Labs    12/09/23 2047 12/09/23 2229 12/10/23 0747 12/10/23 0747 12/10/23 0954 12/10/23 1938 12/11/23 0446 12/11/23 1214  HGB 14.7  --  13.4  --   --   --   --   --   HCT 43.8  --  39.4  --   --   --   --   --   PLT 194  --  172  --   --   --   --   --   APTT  --   --  166*   < >  --  116* 71* 91*  HEPARINUNFRC  --   --  >1.10*  --   --   --  0.52 0.48  CREATININE 1.20*  --  0.98  --   --   --  1.11*  --   TROPONINIHS 81* 459*  --   --  9,246*  --   --   --    < > = values in this interval not displayed.    Estimated Creatinine Clearance: 49.5 mL/min (A) (by C-G formula based on SCr of 1.11 mg/dL (H)).    Assessment: 30 yoF presented with chest tightness. Pharmacy consulted to dose heparin  for ACS. Pt is on apixaban  PTA.  Heparin  level therapeutic, correlating with aPTT.  Goal of Therapy:  Heparin  level 0.3-0.7 units/ml aPTT 66-102 seconds Monitor platelets by anticoagulation protocol: Yes   Plan:  Continue heparin  infusion 800 units/hr Daily aPTT, heparin  level, CBC  Briana Foster, PharmD, BCPS, [facility] Clinical Pharmacist [phone] Please check AMION for all [facility] Pharmacy numbers 12/11/2023

## 2023-12-11 NOTE — Progress Notes (Signed)
 PHARMACY - ANTICOAGULATION CONSULT NOTE  Pharmacy Consult for heparin  Indication: chest pain/ACS, afib  Allergies  Allergen Reactions   Bee Venom Shortness Of Breath and Rash   Claritin  [Loratadine ] Itching and Palpitations   Wasp Venom Anaphylaxis   Apricot Flavoring Agent (Non-Screening) Swelling and Other (See Comments)    Eyes swell shut   Atorvastatin Itching and Swelling    Eye swelling and headaches   Losartan Itching and Rash    Patient Measurements: Height: 5' 8 (172.7 cm) Weight: 83.2 kg (183 lb 6.4 oz) IBW/kg (Calculated) : 63.9 HEPARIN  DW (KG): 80.9  Vital Signs: Temp: 97.8 F (36.6 C) (11/10 0343) Temp Source: Oral (11/10 0343) BP: 120/71 (11/10 0343) Pulse Rate: 62 (11/10 0343)  Labs: Recent Labs    12/09/23 2047 12/09/23 2229 12/10/23 0747 12/10/23 0954 12/10/23 1938 12/11/23 0446  HGB 14.7  --  13.4  --   --   --   HCT 43.8  --  39.4  --   --   --   PLT 194  --  172  --   --   --   APTT  --   --  166*  --  116* 71*  HEPARINUNFRC  --   --  >1.10*  --   --  0.52  CREATININE 1.20*  --  0.98  --   --   --   TROPONINIHS 81* 459*  --  9,246*  --   --     Estimated Creatinine Clearance: 56.1 mL/min (by C-G formula based on SCr of 0.98 mg/dL).    Assessment: 73 yoF presented with chest tightness. Pharmacy consulted to dose heparin  for ACS. PMH: HTN, severe AS s/p TAVR (2022), tachy/brady syndrome s/p PPM, afib (eliquis ), CKD, T2DM  -Last dose of eliquis  11/8 @0930  will utilize aPTTs for heparin  monitoring given anti-Xa level will be falsely elevated due to apixaban  -CBC WNL -Trop 81 > 459  PTT therapeutic on 800 units/hr. No bleeding per RN, last CBC stable.  Goal of Therapy:  Heparin  level 0.3-0.7 units/ml aPTT 66-102 seconds Monitor platelets by anticoagulation protocol: Yes   Plan:  Continue heparin  infusion 800 units/hr Check confirmatory aPTT in 8 hours and daily while on heparin  Monitor with aPTTs until correlates with anti-Xa  level Continue to monitor H&H and platelets F/u LOT of heparin  and transition eliquis   Lynwood Poplar, PharmD, BCPS Clinical Pharmacist 12/11/2023 5:48 AM

## 2023-12-11 NOTE — Progress Notes (Addendum)
 Rounding Note   Patient Name: Briana Foster Date of Encounter: 12/11/2023  [facility] Cardiologist: Arun K Thukkani, MD   Subjective No significant overnight events. She reports some nausea earlier this morning but no recurrent chest pain/ tightness. No shortness of breath.  Scheduled Meds:  amLODipine   10 mg Oral Daily   aspirin  EC  81 mg Oral Daily   insulin  aspart  0-15 Units Subcutaneous Q4H   irbesartan   150 mg Oral Daily   memantine   5 mg Oral BID   rOPINIRole   4 mg Oral QHS   rosuvastatin   40 mg Oral Daily   sodium chloride  flush  3 mL Intravenous Q12H   Continuous Infusions:  heparin  800 Units/hr (12/11/23 0618)   nitroGLYCERIN  Stopped (12/10/23 0523)   PRN Meds: acetaminophen , albuterol , alum & mag hydroxide-simeth, hydrALAZINE , morphine  injection, nitroGLYCERIN , ondansetron  (ZOFRAN ) IV, sodium chloride  flush   Vital Signs  Vitals:   12/10/23 2019 12/10/23 2320 12/11/23 0343 12/11/23 0749  BP: 101/70 134/66 120/71 (!) 147/75  Pulse: 64 63 62   Resp: 18 (!) 21 17 17   Temp: 97.9 F (36.6 C) 97.9 F (36.6 C) 97.8 F (36.6 C) 97.8 F (36.6 C)  TempSrc: Oral Oral Oral Oral  SpO2: 97% 97% 94%   Weight:      Height:        Intake/Output Summary (Last 24 hours) at 12/11/2023 0858 Last data filed at 12/11/2023 0618 Gross per 24 hour  Intake 449.48 ml  Output --  Net 449.48 ml      12/10/2023   12:31 AM 12/09/2023    8:37 PM 06/13/2023   12:23 PM  Last 3 Weights  Weight (lbs) 183 lb 6.4 oz 180 lb 195 lb  Weight (kg) 83.19 kg 81.647 kg 88.451 kg      Telemetry V-paced rhythm with one short run of NSVT (8 beats). - Personally Reviewed  ECG  No new ECG tracing today. - Personally Reviewed  Physical Exam  GEN: No acute distress.   Neck: No JVD. Cardiac: RRR. Soft II/ VI systolic murmur. No rubs or gallops.  Respiratory: Clear to auscultation bilaterally. No wheezes, rhonchi, or rales.  MS: No lower extremity edema. No  deformity. Neuro:  No focal deficits.  Psych: Normal affect. Responds appropriately.  Labs High Sensitivity Troponin:   Recent Labs  Lab 12/09/23 2047 12/09/23 2229 12/10/23 0954  TROPONINIHS 81* 459* 9,246*     Chemistry Recent Labs  Lab 12/09/23 2047 12/10/23 0747 12/11/23 0446  NA 138 138 140  K 3.5 3.5 3.9  CL 102 105 105  CO2 21* 17* 24  GLUCOSE 190* 109* 122*  BUN 17 19 24*  CREATININE 1.20* 0.98 1.11*  CALCIUM  9.9 9.2 9.6  PROT 6.0*  --   --   ALBUMIN 4.0  --   --   AST 22  --   --   ALT 22  --   --   ALKPHOS 66  --   --   BILITOT 0.8  --   --   GFRNONAA 47* >60 52*  ANIONGAP 15 16* 11    Lipids  Recent Labs  Lab 12/10/23 0555  CHOL 173  TRIG 90  HDL 39*  LDLCALC 116*  CHOLHDL 4.4    Hematology Recent Labs  Lab 12/09/23 2047 12/10/23 0747  WBC 8.5 7.7  RBC 4.86 4.36  HGB 14.7 13.4  HCT 43.8 39.4  MCV 90.1 90.4  MCH 30.2 30.7  MCHC 33.6 34.0  RDW 12.9 13.0  PLT 194 172   Thyroid   Recent Labs  Lab 12/09/23 2229  TSH 1.025  FREET4 0.78    BNPNo results for input(s): BNP, PROBNP in the last 168 hours.  DDimer No results for input(s): DDIMER in the last 168 hours.   Cardiac Studies  Echocardiogram 12/10/2023: Impressions: 1. Left ventricular ejection fraction, by estimation, is 60 to 65%. The  left ventricle has normal function. The left ventricle has no regional  wall motion abnormalities. Left ventricular diastolic parameters are  indeterminate.   2. Right ventricular systolic function is normal. The right ventricular  size is normal. Tricuspid regurgitation signal is inadequate for assessing  PA pressure.   3. The mitral valve is abnormal. Mild mitral valve regurgitation. Mild  mitral stenosis. The mean mitral valve gradient is 4.0 mmHg. Moderate  mitral annular calcification.   4. Tricuspid valve regurgitation is moderate.   5. Moderate aortic stenosis. AVA VTI 1, mean gradient 19 mmHg, DI 0.32.  SVI 32. Lower than  expected gradient likely seconary to low SVI. The  aortic valve is tricuspid. There is moderate calcification of the aortic  valve. There is moderate thickening of  the aortic valve. Aortic valve regurgitation is not visualized. Moderate  aortic valve stenosis.   6. The inferior vena cava is normal in size with greater than 50%  respiratory variability, suggesting right atrial pressure of 3 mmHg.    Patient Profile   78 y.o. female with a history of normal coronaries on cardiac catheterization in 05/2020, persistent atrial fibrillation on Eliquis , tachy-brady syndrome s/p dual chamber PPM, severe aortic stenosis s/p TAVR in 05/2020, carotid artery disease hypertension, type 2 diabetes mellitus, CKD stage IIIa, rheumatoid arthritis, and GERD who was admitted on 12/09/2023 with chest pain in setting of hypertensive emergency. However, she also ruled in for NSTEMI.  Assessment & Plan   Chest Tightness NSTEMI Patient has a history of normal coronaries on cardiac catheterization in 05/2020. She presented with chest tightness in setting of hypertensive emergency (systolic BP in the 190s). EKG showed V-paced rhythm. High-sensitivity troponin 81 >> 459 >> 9,246. Echo showed LVEF of 60-65% with normal wall motion.  - Currently chest pain free.  - Continue IV Heparin .   - Continue Aspirin  81mg  daily.  - Will increase Crestor  to 40mg  daily. - It was initially thought that chest tightness was likely secondary to severe hypertension. However, troponin then jumped from 459 >> 9,246 which is more then I would expect to see with demand ischemia. Will need cardiac catheterization.   The patient understands that risks include but are not limited to stroke (1 in 1000), death (1 in 1000), kidney failure [usually temporary] (1 in 500), bleeding (1 in 200), allergic reaction [possibly serious] (1 in 200), and agrees to proceed.   Persistent Atrial Fibrillation Tachy-Brady Syndrome s/p PPM Telemetry shows V-paced  rhythm with one 8 beat run of NSVT. - Not on any rate control agents.  - On Eliquis  at home but currently on IV Heparin .   Severe Aortic Stenosis s/p TAVR S/p TAVR in 05/2020. Echo this admission shows moderate AS with mean gradient of 19.0 mmHg. - Continue to monitor as an outpatient.   Hypertensive Emergency Systolic BP as high as the 190s on admission. She was started on IV Nitro with improvement.  - BP mildly elevated this morning but has been mostly well controlled.  - IV Nitro has been weaned off. - Continue Amlodipine  10mg  daily and Irbesartan  150mg  daily.  Hyperlipidemia Lipid panel this admission: Total Cholesterol 173, Triglycerides 90, HDL 39, LDL 116.  - Will increase Crestor  from 10mg  to 40mg  daily. - Will need repeat lipid panel and LFTs in 6-8 weeks.  CKD Stage IIIa Creatinine stable at 1.11 today which is around baseline.  Otherwise, per primary team: - Type 2 diabetes mellitus - Rheumatoid arthritis  - GERD   For questions or updates, please contact [facility] Please consult [email or web address] for contact info under   Signed, Callie E Goodrich, PA-C  12/11/2023, 8:45 AM     ADDENDUM:   Patient seen and examined with Callie E Goodrich, PA-C.  I personally taken a history, examined the patient, reviewed relevant notes,  laboratory data / imaging studies.  I performed a substantive portion of this encounter and formulated the important aspects of the plan.  I agree with the APP's note, impression, and recommendations; however, I have edited the note to reflect changes or salient points.   Patient presented to the hospital with a chief complaint of chest pain and elevated blood pressures. Cardiology asked to see him earlier on morning rounds due to the trend of troponin. Resting in bed comfortably. No family at bedside. No active chest pain or heart failure symptoms  PHYSICAL EXAM: Today's Vitals   12/10/23 2019 12/10/23 2320 12/11/23 0343  12/11/23 0749  BP: 101/70 134/66 120/71 (!) 147/75  Pulse: 64 63 62   Resp: 18 (!) 21 17 17   Temp: 97.9 F (36.6 C) 97.9 F (36.6 C) 97.8 F (36.6 C) 97.8 F (36.6 C)  TempSrc: Oral Oral Oral Oral  SpO2: 97% 97% 94%   Weight:      Height:      PainSc: 0-No pain      Body mass index is 27.89 kg/m.   Net IO Since Admission: 536.86 mL [12/11/23 0858]  Filed Weights   12/09/23 2037 12/10/23 0031  Weight: 81.6 kg 83.2 kg    Physical Exam  Constitutional: No distress.  hemodynamically stable  Neck: No JVD present.  Cardiovascular: Normal rate, regular rhythm, S1 normal and S2 normal. Exam reveals no gallop, no S3 and no S4.  Murmur heard. Midsystolic murmur is present with a grade of 2/6 at the upper right sternal border. Pulses:      Radial pulses are 2+ on the right side and 2+ on the left side.       Dorsalis pedis pulses are 1+ on the right side and 1+ on the left side.  Pulmonary/Chest: Effort normal and breath sounds normal. No stridor. She has no wheezes. She has no rales.  Musculoskeletal:        General: No edema.     Cervical back: Neck supple.  Skin: Skin is warm.    EKG: (personally reviewed by me) 12/10/2023: Ventricular paced rhythm  Telemetry: (personally reviewed by me) V paced. Short episode of NSVT, as mentioned above   Impression & Recommendations: :  NSTEMI Hypertensive emergency with SBP >200 mmHg and chest tightness - BP now better controlled, no new back pain or syncope.  Persistent atrial fibrillation. Tachybradycardia syndrome status post pacemaker implant. History of aortic stenosis status post TAVR  Patient presented to the hospital with chief complaint of chest pain and elevated blood pressures consistent with hypertensive emergency.  Initially her symptoms and cardiac biomarkers were felt to be demand ischemia. However due to rising troponins and her presenting symptoms underlying CAD cannot be entirely ruled out.  Echocardiogram notes  preserved LVEF.  With regards to ischemic workup recommended left heart catheterization with possible interventions.  Alternatives discussed.    Informed Consent   Shared Decision Making/Informed Consent The risks [stroke (1 in 1000), death (1 in 1000), kidney failure [usually temporary] (1 in 500), bleeding (1 in 200), allergic reaction [possibly serious] (1 in 200)], benefits (diagnostic support and management of coronary artery disease) and alternatives of a cardiac catheterization were discussed in detail with Ms. Esty and she is willing to proceed.     Telemetry independently reviewed.  No active chest pain  Spoke to attending physician regarding care rounds.  Patient is agreeable proceed forward left heart catheterization.  Did offer to call the patient's daughter as part of the informed consent; however, patient wants us  to hold off but will do it herself.   Medical Decision:  Complexity: High - NSTEMI - rising troponin -patient participated in shared decision to proceed with left heart catheterization and possible intervention Interdisciplinary: Yes Independently reviewed: Labs, telemetry, echocardiogram Prescription drug management -recommendations stated above Family updated: No, per patient request.   Further recommendations to follow as the case evolves.   This note was created using a voice recognition software as a result there may be grammatical errors inadvertently enclosed that do not reflect the nature of this encounter. Every attempt is made to correct such errors.   Madonna Michele HAS, [facility]  A Division of [facility] [address], [place], [place] [address]  Pager: [phone] Office: [phone] 12/11/2023 8:58 AM

## 2023-12-11 NOTE — H&P (View-Only) (Signed)
 Rounding Note   Patient Name: Briana Foster Date of Encounter: 12/11/2023  [facility] Cardiologist: Arun K Thukkani, MD   Subjective No significant overnight events. She reports some nausea earlier this morning but no recurrent chest pain/ tightness. No shortness of breath.  Scheduled Meds:  amLODipine   10 mg Oral Daily   aspirin  EC  81 mg Oral Daily   insulin  aspart  0-15 Units Subcutaneous Q4H   irbesartan   150 mg Oral Daily   memantine   5 mg Oral BID   rOPINIRole   4 mg Oral QHS   rosuvastatin   40 mg Oral Daily   sodium chloride  flush  3 mL Intravenous Q12H   Continuous Infusions:  heparin  800 Units/hr (12/11/23 0618)   nitroGLYCERIN  Stopped (12/10/23 0523)   PRN Meds: acetaminophen , albuterol , alum & mag hydroxide-simeth, hydrALAZINE , morphine  injection, nitroGLYCERIN , ondansetron  (ZOFRAN ) IV, sodium chloride  flush   Vital Signs  Vitals:   12/10/23 2019 12/10/23 2320 12/11/23 0343 12/11/23 0749  BP: 101/70 134/66 120/71 (!) 147/75  Pulse: 64 63 62   Resp: 18 (!) 21 17 17   Temp: 97.9 F (36.6 C) 97.9 F (36.6 C) 97.8 F (36.6 C) 97.8 F (36.6 C)  TempSrc: Oral Oral Oral Oral  SpO2: 97% 97% 94%   Weight:      Height:        Intake/Output Summary (Last 24 hours) at 12/11/2023 0858 Last data filed at 12/11/2023 0618 Gross per 24 hour  Intake 449.48 ml  Output --  Net 449.48 ml      12/10/2023   12:31 AM 12/09/2023    8:37 PM 06/13/2023   12:23 PM  Last 3 Weights  Weight (lbs) 183 lb 6.4 oz 180 lb 195 lb  Weight (kg) 83.19 kg 81.647 kg 88.451 kg      Telemetry V-paced rhythm with one short run of NSVT (8 beats). - Personally Reviewed  ECG  No new ECG tracing today. - Personally Reviewed  Physical Exam  GEN: No acute distress.   Neck: No JVD. Cardiac: RRR. Soft II/ VI systolic murmur. No rubs or gallops.  Respiratory: Clear to auscultation bilaterally. No wheezes, rhonchi, or rales.  MS: No lower extremity edema. No  deformity. Neuro:  No focal deficits.  Psych: Normal affect. Responds appropriately.  Labs High Sensitivity Troponin:   Recent Labs  Lab 12/09/23 2047 12/09/23 2229 12/10/23 0954  TROPONINIHS 81* 459* 9,246*     Chemistry Recent Labs  Lab 12/09/23 2047 12/10/23 0747 12/11/23 0446  NA 138 138 140  K 3.5 3.5 3.9  CL 102 105 105  CO2 21* 17* 24  GLUCOSE 190* 109* 122*  BUN 17 19 24*  CREATININE 1.20* 0.98 1.11*  CALCIUM  9.9 9.2 9.6  PROT 6.0*  --   --   ALBUMIN 4.0  --   --   AST 22  --   --   ALT 22  --   --   ALKPHOS 66  --   --   BILITOT 0.8  --   --   GFRNONAA 47* >60 52*  ANIONGAP 15 16* 11    Lipids  Recent Labs  Lab 12/10/23 0555  CHOL 173  TRIG 90  HDL 39*  LDLCALC 116*  CHOLHDL 4.4    Hematology Recent Labs  Lab 12/09/23 2047 12/10/23 0747  WBC 8.5 7.7  RBC 4.86 4.36  HGB 14.7 13.4  HCT 43.8 39.4  MCV 90.1 90.4  MCH 30.2 30.7  MCHC 33.6 34.0  RDW 12.9 13.0  PLT 194 172   Thyroid   Recent Labs  Lab 12/09/23 2229  TSH 1.025  FREET4 0.78    BNPNo results for input(s): BNP, PROBNP in the last 168 hours.  DDimer No results for input(s): DDIMER in the last 168 hours.   Cardiac Studies  Echocardiogram 12/10/2023: Impressions: 1. Left ventricular ejection fraction, by estimation, is 60 to 65%. The  left ventricle Foster normal function. The left ventricle Foster no regional  wall motion abnormalities. Left ventricular diastolic parameters are  indeterminate.   2. Right ventricular systolic function is normal. The right ventricular  size is normal. Tricuspid regurgitation signal is inadequate for assessing  PA pressure.   3. The mitral valve is abnormal. Mild mitral valve regurgitation. Mild  mitral stenosis. The mean mitral valve gradient is 4.0 mmHg. Moderate  mitral annular calcification.   4. Tricuspid valve regurgitation is moderate.   5. Moderate aortic stenosis. AVA VTI 1, mean gradient 19 mmHg, DI 0.32.  SVI 32. Lower than  expected gradient likely seconary to low SVI. The  aortic valve is tricuspid. There is moderate calcification of the aortic  valve. There is moderate thickening of  the aortic valve. Aortic valve regurgitation is not visualized. Moderate  aortic valve stenosis.   6. The inferior vena cava is normal in size with greater than 50%  respiratory variability, suggesting right atrial pressure of 3 mmHg.    Patient Profile   78 y.o. female with a history of normal coronaries on cardiac catheterization in 05/2020, persistent atrial fibrillation on Eliquis , tachy-brady syndrome s/p dual chamber PPM, severe aortic stenosis s/p TAVR in 05/2020, carotid artery disease hypertension, type 2 diabetes mellitus, CKD stage IIIa, rheumatoid arthritis, and GERD who was admitted on 12/09/2023 with chest pain in setting of hypertensive emergency. However, she also ruled in for NSTEMI.  Assessment & Plan   Chest Tightness NSTEMI Patient Foster a history of normal coronaries on cardiac catheterization in 05/2020. She presented with chest tightness in setting of hypertensive emergency (systolic BP in the 190s). EKG showed V-paced rhythm. High-sensitivity troponin 81 >> 459 >> 9,246. Echo showed LVEF of 60-65% with normal wall motion.  - Currently chest pain free.  - Continue IV Heparin .   - Continue Aspirin  81mg  daily.  - Will increase Crestor  to 40mg  daily. - It was initially thought that chest tightness was likely secondary to severe hypertension. However, troponin then jumped from 459 >> 9,246 which is more then I would expect to see with demand ischemia. Will need cardiac catheterization.   The patient understands that risks include but are not limited to stroke (1 in 1000), death (1 in 1000), kidney failure [usually temporary] (1 in 500), bleeding (1 in 200), allergic reaction [possibly serious] (1 in 200), and agrees to proceed.   Persistent Atrial Fibrillation Tachy-Brady Syndrome s/p PPM Telemetry shows V-paced  rhythm with one 8 beat run of NSVT. - Not on any rate control agents.  - On Eliquis  at home but currently on IV Heparin .   Severe Aortic Stenosis s/p TAVR S/p TAVR in 05/2020. Echo this admission shows moderate AS with mean gradient of 19.0 mmHg. - Continue to monitor as an outpatient.   Hypertensive Emergency Systolic BP as high as the 190s on admission. She was started on IV Nitro with improvement.  - BP mildly elevated this morning but Foster been mostly well controlled.  - IV Nitro Foster been weaned off. - Continue Amlodipine  10mg  daily and Irbesartan  150mg  daily.  Hyperlipidemia Lipid panel this admission: Total Cholesterol 173, Triglycerides 90, HDL 39, LDL 116.  - Will increase Crestor  from 10mg  to 40mg  daily. - Will need repeat lipid panel and LFTs in 6-8 weeks.  CKD Stage IIIa Creatinine stable at 1.11 today which is around baseline.  Otherwise, per primary team: - Type 2 diabetes mellitus - Rheumatoid arthritis  - GERD   For questions or updates, please contact [facility] Please consult [email or web address] for contact info under   Signed, Callie E Goodrich, PA-C  12/11/2023, 8:45 AM     ADDENDUM:   Patient seen and examined with Callie E Goodrich, PA-C.  I personally taken a history, examined the patient, reviewed relevant notes,  laboratory data / imaging studies.  I performed a substantive portion of this encounter and formulated the important aspects of the plan.  I agree with the APP's note, impression, and recommendations; however, I have edited the note to reflect changes or salient points.   Patient presented to the hospital with a chief complaint of chest pain and elevated blood pressures. Cardiology asked to see him earlier on morning rounds due to the trend of troponin. Resting in bed comfortably. No family at bedside. No active chest pain or heart failure symptoms  PHYSICAL EXAM: Today's Vitals   12/10/23 2019 12/10/23 2320 12/11/23 0343  12/11/23 0749  BP: 101/70 134/66 120/71 (!) 147/75  Pulse: 64 63 62   Resp: 18 (!) 21 17 17   Temp: 97.9 F (36.6 C) 97.9 F (36.6 C) 97.8 F (36.6 C) 97.8 F (36.6 C)  TempSrc: Oral Oral Oral Oral  SpO2: 97% 97% 94%   Weight:      Height:      PainSc: 0-No pain      Body mass index is 27.89 kg/m.   Net IO Since Admission: 536.86 mL [12/11/23 0858]  Filed Weights   12/09/23 2037 12/10/23 0031  Weight: 81.6 kg 83.2 kg    Physical Exam  Constitutional: No distress.  hemodynamically stable  Neck: No JVD present.  Cardiovascular: Normal rate, regular rhythm, S1 normal and S2 normal. Exam reveals no gallop, no S3 and no S4.  Murmur heard. Midsystolic murmur is present with a grade of 2/6 at the upper right sternal border. Pulses:      Radial pulses are 2+ on the right side and 2+ on the left side.       Dorsalis pedis pulses are 1+ on the right side and 1+ on the left side.  Pulmonary/Chest: Effort normal and breath sounds normal. No stridor. She Foster no wheezes. She Foster no rales.  Musculoskeletal:        General: No edema.     Cervical back: Neck supple.  Skin: Skin is warm.    EKG: (personally reviewed by me) 12/10/2023: Ventricular paced rhythm  Telemetry: (personally reviewed by me) V paced. Short episode of NSVT, as mentioned above   Impression & Recommendations: :  NSTEMI Hypertensive emergency with SBP >200 mmHg and chest tightness - BP now better controlled, no new back pain or syncope.  Persistent atrial fibrillation. Tachybradycardia syndrome status post pacemaker implant. History of aortic stenosis status post TAVR  Patient presented to the hospital with chief complaint of chest pain and elevated blood pressures consistent with hypertensive emergency.  Initially her symptoms and cardiac biomarkers were felt to be demand ischemia. However due to rising troponins and her presenting symptoms underlying CAD cannot be entirely ruled out.  Echocardiogram notes  preserved LVEF.  With regards to ischemic workup recommended left heart catheterization with possible interventions.  Alternatives discussed.    Informed Consent   Shared Decision Making/Informed Consent The risks [stroke (1 in 1000), death (1 in 1000), kidney failure [usually temporary] (1 in 500), bleeding (1 in 200), allergic reaction [possibly serious] (1 in 200)], benefits (diagnostic support and management of coronary artery disease) and alternatives of a cardiac catheterization were discussed in detail with Ms. Esty and she is willing to proceed.     Telemetry independently reviewed.  No active chest pain  Spoke to attending physician regarding care rounds.  Patient is agreeable proceed forward left heart catheterization.  Did offer to call the patient's daughter as part of the informed consent; however, patient wants us  to hold off but will do it herself.   Medical Decision:  Complexity: High - NSTEMI - rising troponin -patient participated in shared decision to proceed with left heart catheterization and possible intervention Interdisciplinary: Yes Independently reviewed: Labs, telemetry, echocardiogram Prescription drug management -recommendations stated above Family updated: No, per patient request.   Further recommendations to follow as the case evolves.   This note was created using a voice recognition software as a result there may be grammatical errors inadvertently enclosed that do not reflect the nature of this encounter. Every attempt is made to correct such errors.   Briana Foster, [facility]  A Division of [facility] [address], [place], [place] [address]  Pager: [phone] Office: [phone] 12/11/2023 8:58 AM

## 2023-12-11 NOTE — Progress Notes (Signed)
 [facility] PROGRESS NOTE    Progress Note  Briana Foster  FMW:[id number] DOB: 1945-10-12 DOA: 12/09/2023 PCP: Ransom Other, MD     Brief Narrative:   Briana Foster is an 78 y.o. female past medical history significant for persistent atrial fibrillation on Eliquis , chronic HFpEF, essential hypertension aortic stenosis status post TAVR's, uncontrolled diabetes mellitus type 2 rheumatoid arthritis not on immunosuppressive, advanced dementia therapy comes in with chest pain midsternal nonradiating EMS found to have her with a blood pressure 180/100 Assessment/Plan:   Elevated troponins/chest pain: Cardiac biomarkers delta more than 100%.  Twelve-lead EKG shows A-fib paced rhythm prolonged QTc. Cardiology was consulted. Cardiac troponins were over 9000. 2D echo was done that showed no wall motion abnormality, EF of 60%. Continue IV nitro, IV heparin , aspirin , statins and not a candidate for beta-blocker due to bradycardia. Probably need an ischemic workup with a left heart cath, will discuss with cardiology.  Hypertensive urgency/essential hypertension: Started on nitroglycerin  drip, continue Norvasc , and Avapro . Pressures improved continue nitroglycerin  drip.  Acute kidney injury: With a baseline creatinine of less than 1 on admission 1.2. Continue to monitor and slightly improved.  Diabetes mellitus type 2: Continue sliding scale insulin . A1c is 6.8  Paroxysmal atrial fibrillation: Rate controlled around 60. Not on a AV nodal blocking agent.  Chronic HFpEF: 2D echo earlier this year showed an EF of 65% mild concentric hypertrophy.  Dilated left atrium and right atrium.  Mitral valve regurgitation with unknown TAVR's.   DVT prophylaxis: heparin  Family Communication:none Status is: Inpatient Remains inpatient appropriate because: Elevated troponins    Code Status:     Code Status Orders  (From admission, onward)           Start     Ordered    12/09/23 2341  Full code  Continuous       Question:  By:  Answer:  Other   12/09/23 2342           Code Status History     Date Active Date Inactive Code Status Order ID Comments User Context   06/13/2023 1752 06/21/2023 2213 Full Code [id number]  Patti Rosina JONELLE DEVONNA Inpatient   04/13/2023 1235 04/15/2023 2221 Full Code [id number]  Arlice Reichert, MD ED   05/22/2022 1403 05/30/2022 1732 Full Code [id number]  Laurita Cort DASEN, MD ED   02/01/2022 0529 02/03/2022 1840 Full Code [id number]  Marcene Eva KATHEE, DO ED   01/26/2022 1343 01/28/2022 1917 Full Code [id number]  Barbarann Nest, MD ED   11/07/2021 1346 11/09/2021 2148 Full Code [id number]  Laurita Cort DASEN, MD ED   11/04/2021 1425 11/06/2021 1833 Full Code [id number]  Laurita Cort DASEN, MD ED   07/14/2021 1419 07/14/2021 2243 Full Code [id number]  Inocencio Soyla Lunger, MD Inpatient   10/27/2020 1926 10/29/2020 1729 Full Code [id number]  Mona Vinie BROCKS, MD ED   06/30/2020 1646 07/01/2020 1742 Full Code [id number]  Sebastian Lamarr JONELLE, PA-C Inpatient   06/21/2020 0824 06/24/2020 2240 Full Code [id number]  Maryelizabeth Sos, NP ED   06/17/2020 0947 06/17/2020 1715 Full Code [id number]  Claudene Victory ORN, MD Inpatient         IV Access:   Peripheral IV   Procedures and diagnostic studies:   ECHOCARDIOGRAM COMPLETE Result Date: 12/10/2023    ECHOCARDIOGRAM REPORT   Patient Name:   Briana Foster Date of Exam: 12/10/2023 Medical Rec #:  [id number]        Height:       68.0  in Accession #:    [id number]       Weight:       183.4 lb Date of Birth:  12/17/1945       BSA:          1.970 m Patient Age:    77 years         BP:           128/74 mmHg Patient Gender: F                HR:           60 bpm. Exam Location:  Inpatient Procedure: 2D Echo, Cardiac Doppler and Color Doppler (Both Spectral and Color            Flow Doppler were utilized during procedure). Indications:    Chest Pain R07.9  History:        Patient has prior history of Echocardiogram examinations, most                  recent 04/14/2023. CHF, CAD and Previous Myocardial Infarction,                 Arrythmias:Atrial Fibrillation and LBBB, Signs/Symptoms:Dyspnea;                 Risk Factors:Hypertension, Diabetes and Dyslipidemia.  Sonographer:    Thea Norlander RCS Referring Phys: SALAH, HUSAM, M IMPRESSIONS  1. Left ventricular ejection fraction, by estimation, is 60 to 65%. The left ventricle has normal function. The left ventricle has no regional wall motion abnormalities. Left ventricular diastolic parameters are indeterminate.  2. Right ventricular systolic function is normal. The right ventricular size is normal. Tricuspid regurgitation signal is inadequate for assessing PA pressure.  3. The mitral valve is abnormal. Mild mitral valve regurgitation. Mild mitral stenosis. The mean mitral valve gradient is 4.0 mmHg. Moderate mitral annular calcification.  4. Tricuspid valve regurgitation is moderate.  5. Moderate aortic stenosis. AVA VTI 1, mean gradient 19 mmHg, DI 0.32. SVI 32. Lower than expected gradient likely seconary to low SVI. The aortic valve is tricuspid. There is moderate calcification of the aortic valve. There is moderate thickening of the aortic valve. Aortic valve regurgitation is not visualized. Moderate aortic valve stenosis.  6. The inferior vena cava is normal in size with greater than 50% respiratory variability, suggesting right atrial pressure of 3 mmHg. FINDINGS  Left Ventricle: Left ventricular ejection fraction, by estimation, is 60 to 65%. The left ventricle has normal function. The left ventricle has no regional wall motion abnormalities. The left ventricular internal cavity size was normal in size. There is  no left ventricular hypertrophy. Left ventricular diastolic parameters are indeterminate. Right Ventricle: The right ventricular size is normal. Right vetricular wall thickness was not well visualized. Right ventricular systolic function is normal. Tricuspid regurgitation signal is  inadequate for assessing PA pressure. The tricuspid regurgitant velocity is 2.66 m/s, and with an assumed right atrial pressure of 3 mmHg, the estimated right ventricular systolic pressure is 31.3 mmHg. Left Atrium: Left atrial size was normal in size. Right Atrium: Right atrial size was normal in size. Pericardium: There is no evidence of pericardial effusion. Mitral Valve: The mitral valve is abnormal. There is moderate thickening of the mitral valve leaflet(s). There is moderate calcification of the mitral valve leaflet(s). Moderate mitral annular calcification. Mild mitral valve regurgitation. Mild mitral valve stenosis. MV peak gradient, 10.5 mmHg. The mean mitral valve gradient is 4.0 mmHg. Tricuspid Valve: The tricuspid  valve is normal in structure. Tricuspid valve regurgitation is moderate . No evidence of tricuspid stenosis. Aortic Valve: Moderate aortic stenosis. AVA VTI 1, mean gradient 19 mmHg, DI 0.32. SVI 32. Lower than expected gradient likely seconary to low SVI. The aortic valve is tricuspid. There is moderate calcification of the aortic valve. There is moderate thickening of the aortic valve. There is moderate aortic valve annular calcification. Aortic valve regurgitation is not visualized. Moderate aortic stenosis is present. Aortic valve mean gradient measures 19.0 mmHg. Aortic valve peak gradient measures 36.5 mmHg. Aortic valve area, by VTI measures 1.00 cm. Pulmonic Valve: The pulmonic valve was not well visualized. Pulmonic valve regurgitation is not visualized. No evidence of pulmonic stenosis. Aorta: The aortic root and ascending aorta are structurally normal, with no evidence of dilitation. Venous: The inferior vena cava is normal in size with greater than 50% respiratory variability, suggesting right atrial pressure of 3 mmHg. IAS/Shunts: No atrial level shunt detected by color flow Doppler.  LEFT VENTRICLE PLAX 2D LVIDd:         4.10 cm   Diastology LVIDs:         2.70 cm   LV e'  medial:    5.36 cm/s LV PW:         1.20 cm   LV E/e' medial:  25.6 LV IVS:        0.80 cm   LV e' lateral:   5.63 cm/s LVOT diam:     2.00 cm   LV E/e' lateral: 24.3 LV SV:         63 LV SV Index:   32 LVOT Area:     3.14 cm  RIGHT VENTRICLE             IVC RV S prime:     13.20 cm/s  IVC diam: 2.40 cm TAPSE (M-mode): 1.6 cm LEFT ATRIUM             Index        RIGHT ATRIUM           Index LA diam:        4.60 cm 2.34 cm/m   RA Area:     16.30 cm LA Vol (A2C):   41.6 ml 21.12 ml/m  RA Volume:   42.10 ml  21.37 ml/m LA Vol (A4C):   55.3 ml 28.07 ml/m LA Biplane Vol: 51.9 ml 26.35 ml/m  AORTIC VALVE AV Area (Vmax):    1.01 cm AV Area (Vmean):   0.98 cm AV Area (VTI):     1.00 cm AV Vmax:           302.00 cm/s AV Vmean:          200.000 cm/s AV VTI:            0.633 m AV Peak Grad:      36.5 mmHg AV Mean Grad:      19.0 mmHg LVOT Vmax:         97.55 cm/s LVOT Vmean:        62.650 cm/s LVOT VTI:          0.202 m LVOT/AV VTI ratio: 0.32  AORTA Ao Root diam: 2.80 cm Ao Asc diam:  3.40 cm MITRAL VALVE                TRICUSPID VALVE MV Area (PHT): 1.75 cm     TR Peak grad:   28.3 mmHg MV Peak grad:  10.5 mmHg  TR Vmax:        266.00 cm/s MV Mean grad:  4.0 mmHg MV Vmax:       1.62 m/s     SHUNTS MV Vmean:      94.4 cm/s    Systemic VTI:  0.20 m MV Decel Time: 433 msec     Systemic Diam: 2.00 cm MV E velocity: 137.00 cm/s Dorn Ross MD Electronically signed by Dorn Ross MD Signature Date/Time: 12/10/2023/2:33:41 PM    Final    DG Chest 2 View Result Date: 12/09/2023 EXAM: 2 VIEW(S) XRAY OF THE CHEST 12/09/2023 09:11:00 PM COMPARISON: None available. CLINICAL HISTORY: cp FINDINGS: LINES, TUBES AND DEVICES: TAVR noted. Left chest pacemaker with single lead terminating in right ventricle. LUNGS AND PLEURA: No focal pulmonary opacity. No pleural effusion. HEART AND MEDIASTINUM: Mild cardiomegaly. BONES AND SOFT TISSUES: Multilevel thoracic osteophytosis. IMPRESSION: 1. No acute cardiopulmonary  abnormality. Electronically signed by: Oneil Devonshire MD 12/09/2023 09:21 PM EST RP Workstation: [id number]     Medical Consultants:   None.   Subjective:    Briana Foster complaining is on the chest and this morning retrosternal  Objective:    Vitals:   12/10/23 2019 12/10/23 2320 12/11/23 0343 12/11/23 0749  BP: 101/70 134/66 120/71 (!) 147/75  Pulse: 64 63 62   Resp: 18 (!) 21 17 17   Temp: 97.9 F (36.6 C) 97.9 F (36.6 C) 97.8 F (36.6 C) 97.8 F (36.6 C)  TempSrc: Oral Oral Oral Oral  SpO2: 97% 97% 94%   Weight:      Height:       SpO2: 94 % O2 Flow Rate (L/min): 0 L/min   Intake/Output Summary (Last 24 hours) at 12/11/2023 0754 Last data filed at 12/11/2023 0618 Gross per 24 hour  Intake 449.48 ml  Output --  Net 449.48 ml   Filed Weights   12/09/23 2037 12/10/23 0031  Weight: 81.6 kg 83.2 kg    Exam: General exam: In no acute distress. Respiratory system: Good air movement and clear to auscultation. Cardiovascular system: S1 & S2 heard, RRR. No JVD. Gastrointestinal system: Abdomen is nondistended, soft and nontender.  Extremities: No pedal edema. Skin: No rashes, lesions or ulcers Psychiatry: Judgement and insight appear normal. Mood & affect appropriate.  Data Reviewed:    Labs: Basic Metabolic Panel: Recent Labs  Lab 12/09/23 2047 12/10/23 0747 12/11/23 0446  NA 138 138 140  K 3.5 3.5 3.9  CL 102 105 105  CO2 21* 17* 24  GLUCOSE 190* 109* 122*  BUN 17 19 24*  CREATININE 1.20* 0.98 1.11*  CALCIUM  9.9 9.2 9.6   GFR Estimated Creatinine Clearance: 49.5 mL/min (A) (by C-G formula based on SCr of 1.11 mg/dL (H)). Liver Function Tests: Recent Labs  Lab 12/09/23 2047  AST 22  ALT 22  ALKPHOS 66  BILITOT 0.8  PROT 6.0*  ALBUMIN 4.0   No results for input(s): LIPASE, AMYLASE in the last 168 hours. No results for input(s): AMMONIA in the last 168 hours. Coagulation profile No results for input(s): INR, PROTIME in  the last 168 hours. COVID-19 Labs  No results for input(s): DDIMER, FERRITIN, LDH, CRP in the last 72 hours.  Lab Results  Component Value Date   SARSCOV2NAA NEGATIVE 05/22/2022   SARSCOV2NAA POSITIVE (A) 01/26/2022   SARSCOV2NAA NEGATIVE 10/27/2020   SARSCOV2NAA NEGATIVE 06/26/2020    CBC: Recent Labs  Lab 12/09/23 2047 12/10/23 0747  WBC 8.5 7.7  NEUTROABS 5.5  --   HGB  14.7 13.4  HCT 43.8 39.4  MCV 90.1 90.4  PLT 194 172   Cardiac Enzymes: No results for input(s): CKTOTAL, CKMB, CKMBINDEX, TROPONINI in the last 168 hours. BNP (last 3 results) No results for input(s): PROBNP in the last 8760 hours. CBG: Recent Labs  Lab 12/10/23 1619 12/10/23 2024 12/10/23 2325 12/11/23 0353 12/11/23 0747  GLUCAP 206* 203* 111* 106* 129*   D-Dimer: No results for input(s): DDIMER in the last 72 hours. Hgb A1c: Recent Labs    12/09/23 2047  HGBA1C 6.8*   Lipid Profile: Recent Labs    12/10/23 0555  CHOL 173  HDL 39*  LDLCALC 116*  TRIG 90  CHOLHDL 4.4   Thyroid  function studies: Recent Labs    12/09/23 2229  TSH 1.025   Anemia work up: No results for input(s): VITAMINB12, FOLATE, FERRITIN, TIBC, IRON, RETICCTPCT in the last 72 hours. Sepsis Labs: Recent Labs  Lab 12/09/23 2047 12/10/23 0747  WBC 8.5 7.7   Microbiology No results found for this or any previous visit (from the past 240 hours).   Medications:    amLODipine   10 mg Oral Daily   aspirin  EC  81 mg Oral Daily   insulin  aspart  0-15 Units Subcutaneous Q4H   irbesartan   150 mg Oral Daily   memantine   5 mg Oral BID   rOPINIRole   4 mg Oral QHS   rosuvastatin   10 mg Oral Daily   sodium chloride  flush  3 mL Intravenous Q12H   Continuous Infusions:  heparin  800 Units/hr (12/11/23 0618)   nitroGLYCERIN  Stopped (12/10/23 0523)      LOS: 2 days   Erle Odell Castor  [facility]  12/11/2023, 7:54 AM

## 2023-12-11 NOTE — Progress Notes (Signed)
   12/11/23 1259  Spiritual Encounters  Type of Visit Initial  Care provided to: Family  Referral source Clinical staff  Reason for visit Advance directives  OnCall Visit No  Spiritual Framework  Presenting Themes Impactful experiences and emotions  Community/Connection Family  Patient Stress Factors Health changes  Interventions  Spiritual Care Interventions Made Established relationship of care and support;Compassionate presence  Intervention Outcomes  Outcomes Awareness of support;Awareness of health   Chaplain met with Pt to discuss advance directive (AD). Pt shared at this time she did not want to complete an AD. During the visit, Pt began to share stories from her life.  She informed  the chaplain  her love for coffee, however due to her current medical restrictions, she is unable to have coffee at this time. Chaplain engaged in conversation about coffee and offered active listening, spiritual and emotional support.

## 2023-12-11 NOTE — TOC Initial Note (Signed)
 Transition of Care [facility]) - Initial/Assessment Note    Patient Details  Name: Briana Foster MRN: [id number] Date of Birth: 1945-11-09  [facility]) CM/SW Contact:    Sudie Erminio Deems, RN Phone Number: 12/11/2023, 12:10 PM  Clinical Narrative: Patient presented for chest pain-Nstemi-plan for LHC today. PTA patient was from home with support of daughter. Patient states she has PCP and daughter takes her to appointments. Patient has DME rolling walker in the home. No home needs identified during this visit. ICM will continue to follow for additional needs as the patient progresses.                     Expected Discharge Plan: Home/Self Care Barriers to Discharge: No Barriers Identified   Patient Goals and CMS Choice Patient states their goals for this hospitalization and ongoing recovery are:: Plan to return home once stable.   Choice offered to / list presented to : NA      Expected Discharge Plan and Services In-house Referral: NA Discharge Planning Services: CM Consult Post Acute Care Choice: NA Living arrangements for the past 2 months: Single Family Home                   DME Agency: NA       HH Arranged: NA          Prior Living Arrangements/Services Living arrangements for the past 2 months: Single Family Home Lives with:: Adult Children Patient language and need for interpreter reviewed:: Yes Do you feel safe going back to the place where you live?: Yes      Need for Family Participation in Patient Care: Yes (Comment) Care giver support system in place?: Yes (comment) Current home services: DME (rolling walker) Criminal Activity/Legal Involvement Pertinent to Current Situation/Hospitalization: No - Comment as needed  Activities of Daily Living   ADL Screening (condition at time of admission) Independently performs ADLs?: Yes (appropriate for developmental age) Is the patient deaf or have difficulty hearing?: No Does the patient  have difficulty seeing, even when wearing glasses/contacts?: No Does the patient have difficulty concentrating, remembering, or making decisions?: No  Permission Sought/Granted Permission sought to share information with : Family Supports, Case Manager                Emotional Assessment Appearance:: Appears stated age, Malodorous Attitude/Demeanor/Rapport: Engaged Affect (typically observed): Appropriate Orientation: : Oriented to Self, Oriented to Place, Oriented to  Time, Oriented to Situation Alcohol / Substance Use: Not Applicable Psych Involvement: No (comment)  Admission diagnosis:  Elevated troponin [R79.89] NSTEMI (non-ST elevated myocardial infarction) [facility]) [I21.4] Hypertensive emergency [I16.1] Patient Active Problem List   Diagnosis Date Noted   Cardiac pacemaker in situ 12/11/2023   History of aortic stenosis 12/11/2023   History of transcatheter aortic valve replacement (TAVR) 12/11/2023   Myocardial injury 12/09/2023   Chest tightness 12/09/2023   Hypertensive emergency 12/09/2023   NSTEMI (non-ST elevated myocardial infarction) (HCC) 12/09/2023   S/P total knee arthroplasty, right 06/13/2023   Elevated troponin 04/13/2023   CAP (community acquired pneumonia) 05/22/2022   Asthma, chronic obstructive, with acute exacerbation (HCC) 05/22/2022   Pneumonia 05/22/2022   Leukocytosis 02/01/2022   Transient hypotension 02/01/2022   AKI (acute kidney injury) 02/01/2022   Chronic anticoagulation 02/01/2022   Weakness 02/01/2022   Left nephrolithiasis 02/01/2022   History of COVID-19 02/01/2022   COVID-19 virus infection 01/26/2022   Chronic diastolic CHF (congestive heart failure) (HCC) 01/26/2022   Acute  pyelonephritis 11/07/2021   UTI (urinary tract infection) 11/07/2021   Hyperlipidemia 11/05/2021   Abdominal pain, right lower quadrant 11/05/2021   Abdominal pain 11/04/2021   Near syncope 10/27/2020   Atrial fibrillation with slow ventricular response  (HCC) 10/27/2020   LBBB (left bundle branch block) 10/27/2020   Carotid arterial disease    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   Rheumatoid arthritis (HCC)    Restless leg syndrome    Aortic stenosis, severe 01/02/2020   Persistent atrial fibrillation (HCC)    Vitamin D  deficiency 04/10/2018   Uncontrolled type 2 diabetes mellitus with hyperglycemia, without long-term current use of insulin  (HCC) 04/10/2018   Class 1 obesity with serious comorbidity and body mass index (BMI) of 31.0 to 31.9 in adult 03/26/2018   Essential hypertension 03/26/2018   PCP:  Ransom Other, MD Pharmacy:   [facility] [address], [place] - [address] [place] [address] Phone: [phone] Fax: [phone]  [place] [facility] [address] [place] [address] Phone: [phone] Fax: [phone]     Social Drivers of Health (SDOH) Social History: SDOH Screenings   Food Insecurity: No Food Insecurity (12/10/2023)  Housing: Low Risk  (12/10/2023)  Transportation Needs: No Transportation Needs (12/10/2023)  Utilities: Not At Risk (12/10/2023)  Depression (PHQ2-9): Medium Risk (03/22/2018)  Social Connections: Moderately Integrated (12/10/2023)  Tobacco Use: Low Risk  (12/09/2023)   SDOH Interventions:     Readmission Risk Interventions    06/21/2023   10:35 AM 11/05/2021    3:55 PM  Readmission Risk Prevention Plan  Post Dischage Appt Complete Complete  Medication Screening Complete Complete  Transportation Screening Complete Complete

## 2023-12-11 NOTE — Plan of Care (Signed)

## 2023-12-11 NOTE — Interval H&P Note (Signed)
 History and Physical Interval Note:  12/11/2023 3:49 PM  Briana Foster  has presented today for surgery, with the diagnosis of nstemi.  The various methods of treatment have been discussed with the patient and family. After consideration of risks, benefits and other options for treatment, the patient has consented to  Procedure(s): LEFT HEART CATH AND CORONARY ANGIOGRAPHY (N/A) as a surgical intervention.  The patient's history has been reviewed, patient examined, no change in status, stable for surgery.  I have reviewed the patient's chart and labs.  Questions were answered to the patient's satisfaction.     Kupono Marling K Lareen Mullings

## 2023-12-12 DIAGNOSIS — R7989 Other specified abnormal findings of blood chemistry: Secondary | ICD-10-CM | POA: Diagnosis not present

## 2023-12-12 DIAGNOSIS — I161 Hypertensive emergency: Secondary | ICD-10-CM | POA: Diagnosis not present

## 2023-12-12 DIAGNOSIS — I214 Non-ST elevation (NSTEMI) myocardial infarction: Secondary | ICD-10-CM | POA: Diagnosis not present

## 2023-12-12 DIAGNOSIS — Z950 Presence of cardiac pacemaker: Secondary | ICD-10-CM | POA: Diagnosis not present

## 2023-12-12 DIAGNOSIS — Z8679 Personal history of other diseases of the circulatory system: Secondary | ICD-10-CM | POA: Diagnosis not present

## 2023-12-12 LAB — BASIC METABOLIC PANEL WITH GFR
Anion gap: 12 (ref 5–15)
BUN: 19 mg/dL (ref 8–23)
CO2: 23 mmol/L (ref 22–32)
Calcium: 9.5 mg/dL (ref 8.9–10.3)
Chloride: 105 mmol/L (ref 98–111)
Creatinine, Ser: 1.06 mg/dL — ABNORMAL HIGH (ref 0.44–1.00)
GFR, Estimated: 55 mL/min — ABNORMAL LOW (ref >60)
Glucose, Bld: 126 mg/dL — ABNORMAL HIGH (ref 70–99)
Potassium: 4.2 mmol/L (ref 3.5–5.1)
Sodium: 140 mmol/L (ref 135–145)

## 2023-12-12 LAB — CBC
HCT: 41.7 % (ref 36.0–46.0)
Hemoglobin: 14 g/dL (ref 12.0–15.0)
MCH: 30.9 pg (ref 26.0–34.0)
MCHC: 33.6 g/dL (ref 30.0–36.0)
MCV: 92.1 fL (ref 80.0–100.0)
Platelets: 180 K/uL (ref 150–400)
RBC: 4.53 MIL/uL (ref 3.87–5.11)
RDW: 13.4 % (ref 11.5–15.5)
WBC: 6.7 K/uL (ref 4.0–10.5)
nRBC: 0 % (ref 0.0–0.2)

## 2023-12-12 LAB — GLUCOSE, CAPILLARY: Glucose-Capillary: 149 mg/dL — ABNORMAL HIGH (ref 70–99)

## 2023-12-12 MED ORDER — APIXABAN 5 MG PO TABS
5.0000 mg | ORAL_TABLET | Freq: Two times a day (BID) | ORAL | Status: DC
  Administered 2023-12-12: 5 mg via ORAL
  Filled 2023-12-12: qty 1

## 2023-12-12 MED ORDER — ROSUVASTATIN CALCIUM 40 MG PO TABS: 40.0000 mg | ORAL_TABLET | Freq: Every day | ORAL | 1 refills | Status: AC

## 2023-12-12 MED ORDER — METFORMIN HCL 500 MG PO TABS: 1000.0000 mg | ORAL_TABLET | Freq: Every day | ORAL | 4 refills | Status: AC

## 2023-12-12 NOTE — Progress Notes (Signed)
 CARDIAC REHAB PHASE 1   Referral for CRP2 sent to [facility]. Will send post MI education material to address on file.   Vaughn Asberry Hacking, RN BSN 12/12/2023 10:05 AM

## 2023-12-12 NOTE — Care Management Important Message (Signed)
 Important Message  Patient Details  Name: Briana Foster MRN: [id number] Date of Birth: 1945/06/12   Important Message Given:  Yes - Medicare IM     [name] 12/12/2023, 11:15 AM

## 2023-12-12 NOTE — Discharge Summary (Signed)
 Physician Discharge Summary  Briana Foster FMW:[id number] DOB: 1945-06-29 DOA: 12/09/2023  PCP: Ransom Other, MD  Admit date: 12/09/2023 Discharge date: 12/12/2023  Admitted From: home Disposition:  Home  Recommendations for Outpatient Follow-up:  Follow up with Cards in 1-2 weeks Please obtain BMP/CBC in one week   Home Health:No Equipment/Devices:None  Discharge Condition:Stable CODE STATUS:Full Diet recommendation: Heart Healthy  Brief/Interim Summary: 78 y.o. female past medical history significant for persistent atrial fibrillation on Eliquis , chronic HFpEF, essential hypertension aortic stenosis status post TAVR's, uncontrolled diabetes mellitus type 2 rheumatoid arthritis not on immunosuppressive, advanced dementia therapy comes in with chest pain midsternal nonradiating EMS found to have her with a blood pressure 180/100   Discharge Diagnoses:  Principal Problem:   NSTEMI (non-ST elevated myocardial infarction) [facility]) Active Problems:   Myocardial injury   Chest tightness   Hypertensive emergency   Cardiac pacemaker in situ   History of aortic stenosis   History of transcatheter aortic valve replacement (TAVR)  Elevated troponin/chest pain: Cardiac biomarkers more than doubled on repeat. Twelve-lead EKG showed paced rhythm. Cardiology was consulted recommended 2D echo and ischemic workup. Cardiac cath was done that showed nonobstructive disease cardiology recommended medical management.  Hypertensive urgency/essential hypertension: No changes made to her medication continue Norvasc  and Avapro .  Acute kidney injury: Likely prerenal resolved with IV fluids.  Diabetes mellitus type 2: A1c of 6.8. Continue glipizide metformin  was increased follow-up PCP.  Paroxysmal atrial fibrillation: Rate controlled around 60 and on AV nodal blocking agents. Continue Eliquis .  HFpEF: Continue current management.  History of TAVR's: Noted.  Discharge  Instructions  Discharge Instructions     AMB referral to Phase II Cardiac Rehabilitation   Complete by: As directed    Diagnosis: NSTEMI   After initial evaluation and assessments completed: Virtual Based Care may be provided alone or in conjunction with Phase 2 Cardiac Rehab based on patient barriers.: Yes   Intensive Cardiac Rehabilitation (ICR) [facility] location only OR Traditional Cardiac Rehabilitation (TCR) *If criteria for ICR are not met will enroll in TCR [facility] only): Yes   Diet - low sodium heart healthy   Complete by: As directed    Increase activity slowly   Complete by: As directed       Allergies as of 12/12/2023       Reactions   Bee Venom Shortness Of Breath, Rash   Claritin  [loratadine ] Itching, Palpitations   Wasp Venom Anaphylaxis   Apricot Flavoring Agent (non-screening) Swelling, Other (See Comments)   Eyes swell shut   Atorvastatin Itching, Swelling   Eye swelling and headaches   Losartan Itching, Rash        Medication List     STOP taking these medications    Dexcom G7 Sensor Misc   oxyCODONE -acetaminophen  5-325 MG tablet Commonly known as: Percocet       TAKE these medications    acetaminophen  500 MG tablet Commonly known as: TYLENOL  Take 500-1,000 mg by mouth every 8 (eight) hours as needed (for pain or headaches).   amLODipine  10 MG tablet Commonly known as: NORVASC  Take 10 mg by mouth daily.   apixaban  5 MG Tabs tablet Commonly known as: Eliquis  Take 1 tablet (5 mg total) by mouth 2 (two) times daily.   Centrum Silver 50+Women Tabs Take 1 tablet by mouth daily with breakfast.   cyclobenzaprine 5 MG tablet Commonly known as: FLEXERIL Take 5 mg by mouth 3 (three) times daily as needed for muscle spasms.   glimepiride  2 MG  tablet Commonly known as: AMARYL  Take 2 mg by mouth daily with breakfast.   Jardiance  10 MG Tabs tablet Generic drug: empagliflozin  Take 10 mg by mouth daily.   meclizine  25 MG tablet Commonly known as:  ANTIVERT  Take 25 mg by mouth 2 (two) times daily as needed for dizziness.   memantine  5 MG tablet Commonly known as: NAMENDA  Take 5 mg by mouth 2 (two) times daily.   metFORMIN  500 MG tablet Commonly known as: GLUCOPHAGE  Take 2 tablets (1,000 mg total) by mouth daily with breakfast. What changed: how much to take   olmesartan  20 MG tablet Commonly known as: BENICAR  Take 20 mg by mouth daily.   ondansetron  4 MG disintegrating tablet Commonly known as: ZOFRAN -ODT Take 1 tablet (4 mg total) by mouth every 4 (four) hours as needed for nausea or vomiting.   rOPINIRole  4 MG tablet Commonly known as: REQUIP  Take 4 mg by mouth at bedtime. Take 4 mg by mouth at 8 PM   rosuvastatin  40 MG tablet Commonly known as: CRESTOR  Take 1 tablet (40 mg total) by mouth daily. Start taking on: December 13, 2023 What changed:  medication strength how much to take   Symbicort  160-4.5 MCG/ACT inhaler Generic drug: budesonide -formoterol  Inhale 2 puffs into the lungs 2 (two) times daily.   Ventolin  HFA 108 (90 Base) MCG/ACT inhaler Generic drug: albuterol  Inhale 2 puffs into the lungs every 4 (four) hours as needed for wheezing or shortness of breath.        Allergies  Allergen Reactions   Bee Venom Shortness Of Breath and Rash   Claritin  [Loratadine ] Itching and Palpitations   Wasp Venom Anaphylaxis   Apricot Flavoring Agent (Non-Screening) Swelling and Other (See Comments)    Eyes swell shut   Atorvastatin Itching and Swelling    Eye swelling and headaches   Losartan Itching and Rash    Consultations: Cardiology   Procedures/Studies: CARDIAC CATHETERIZATION Result Date: 12/11/2023 1.  Minimal, nonobstructive disease. 2.  LVEDP of 17 mmHg with peak to peak gradient of around 9 to 10 mmHg on pullback across the TAVR valve. Recommendation: Medical therapy with aggressive blood pressure control.   ECHOCARDIOGRAM COMPLETE Result Date: 12/10/2023    ECHOCARDIOGRAM REPORT   Patient  Name:   Briana Foster Date of Exam: 12/10/2023 Medical Rec #:  [id number]        Height:       68.0 in Accession #:    [id number]       Weight:       183.4 lb Date of Birth:  02-24-1945       BSA:          1.970 m Patient Age:    77 years         BP:           128/74 mmHg Patient Gender: F                HR:           60 bpm. Exam Location:  Inpatient Procedure: 2D Echo, Cardiac Doppler and Color Doppler (Both Spectral and Color            Flow Doppler were utilized during procedure). Indications:    Chest Pain R07.9  History:        Patient has prior history of Echocardiogram examinations, most                 recent 04/14/2023. CHF, CAD and Previous Myocardial Infarction,  Arrythmias:Atrial Fibrillation and LBBB, Signs/Symptoms:Dyspnea;                 Risk Factors:Hypertension, Diabetes and Dyslipidemia.  Sonographer:    Thea Norlander RCS Referring Phys: SALAH, HUSAM, M IMPRESSIONS  1. Left ventricular ejection fraction, by estimation, is 60 to 65%. The left ventricle has normal function. The left ventricle has no regional wall motion abnormalities. Left ventricular diastolic parameters are indeterminate.  2. Right ventricular systolic function is normal. The right ventricular size is normal. Tricuspid regurgitation signal is inadequate for assessing PA pressure.  3. The mitral valve is abnormal. Mild mitral valve regurgitation. Mild mitral stenosis. The mean mitral valve gradient is 4.0 mmHg. Moderate mitral annular calcification.  4. Tricuspid valve regurgitation is moderate.  5. Moderate aortic stenosis. AVA VTI 1, mean gradient 19 mmHg, DI 0.32. SVI 32. Lower than expected gradient likely seconary to low SVI. The aortic valve is tricuspid. There is moderate calcification of the aortic valve. There is moderate thickening of the aortic valve. Aortic valve regurgitation is not visualized. Moderate aortic valve stenosis.  6. The inferior vena cava is normal in size with greater than 50%  respiratory variability, suggesting right atrial pressure of 3 mmHg. FINDINGS  Left Ventricle: Left ventricular ejection fraction, by estimation, is 60 to 65%. The left ventricle has normal function. The left ventricle has no regional wall motion abnormalities. The left ventricular internal cavity size was normal in size. There is  no left ventricular hypertrophy. Left ventricular diastolic parameters are indeterminate. Right Ventricle: The right ventricular size is normal. Right vetricular wall thickness was not well visualized. Right ventricular systolic function is normal. Tricuspid regurgitation signal is inadequate for assessing PA pressure. The tricuspid regurgitant velocity is 2.66 m/s, and with an assumed right atrial pressure of 3 mmHg, the estimated right ventricular systolic pressure is 31.3 mmHg. Left Atrium: Left atrial size was normal in size. Right Atrium: Right atrial size was normal in size. Pericardium: There is no evidence of pericardial effusion. Mitral Valve: The mitral valve is abnormal. There is moderate thickening of the mitral valve leaflet(s). There is moderate calcification of the mitral valve leaflet(s). Moderate mitral annular calcification. Mild mitral valve regurgitation. Mild mitral valve stenosis. MV peak gradient, 10.5 mmHg. The mean mitral valve gradient is 4.0 mmHg. Tricuspid Valve: The tricuspid valve is normal in structure. Tricuspid valve regurgitation is moderate . No evidence of tricuspid stenosis. Aortic Valve: Moderate aortic stenosis. AVA VTI 1, mean gradient 19 mmHg, DI 0.32. SVI 32. Lower than expected gradient likely seconary to low SVI. The aortic valve is tricuspid. There is moderate calcification of the aortic valve. There is moderate thickening of the aortic valve. There is moderate aortic valve annular calcification. Aortic valve regurgitation is not visualized. Moderate aortic stenosis is present. Aortic valve mean gradient measures 19.0 mmHg. Aortic valve peak  gradient measures 36.5 mmHg. Aortic valve area, by VTI measures 1.00 cm. Pulmonic Valve: The pulmonic valve was not well visualized. Pulmonic valve regurgitation is not visualized. No evidence of pulmonic stenosis. Aorta: The aortic root and ascending aorta are structurally normal, with no evidence of dilitation. Venous: The inferior vena cava is normal in size with greater than 50% respiratory variability, suggesting right atrial pressure of 3 mmHg. IAS/Shunts: No atrial level shunt detected by color flow Doppler.  LEFT VENTRICLE PLAX 2D LVIDd:         4.10 cm   Diastology LVIDs:         2.70 cm  LV e' medial:    5.36 cm/s LV PW:         1.20 cm   LV E/e' medial:  25.6 LV IVS:        0.80 cm   LV e' lateral:   5.63 cm/s LVOT diam:     2.00 cm   LV E/e' lateral: 24.3 LV SV:         63 LV SV Index:   32 LVOT Area:     3.14 cm  RIGHT VENTRICLE             IVC RV S prime:     13.20 cm/s  IVC diam: 2.40 cm TAPSE (M-mode): 1.6 cm LEFT ATRIUM             Index        RIGHT ATRIUM           Index LA diam:        4.60 cm 2.34 cm/m   RA Area:     16.30 cm LA Vol (A2C):   41.6 ml 21.12 ml/m  RA Volume:   42.10 ml  21.37 ml/m LA Vol (A4C):   55.3 ml 28.07 ml/m LA Biplane Vol: 51.9 ml 26.35 ml/m  AORTIC VALVE AV Area (Vmax):    1.01 cm AV Area (Vmean):   0.98 cm AV Area (VTI):     1.00 cm AV Vmax:           302.00 cm/s AV Vmean:          200.000 cm/s AV VTI:            0.633 m AV Peak Grad:      36.5 mmHg AV Mean Grad:      19.0 mmHg LVOT Vmax:         97.55 cm/s LVOT Vmean:        62.650 cm/s LVOT VTI:          0.202 m LVOT/AV VTI ratio: 0.32  AORTA Ao Root diam: 2.80 cm Ao Asc diam:  3.40 cm MITRAL VALVE                TRICUSPID VALVE MV Area (PHT): 1.75 cm     TR Peak grad:   28.3 mmHg MV Peak grad:  10.5 mmHg    TR Vmax:        266.00 cm/s MV Mean grad:  4.0 mmHg MV Vmax:       1.62 m/s     SHUNTS MV Vmean:      94.4 cm/s    Systemic VTI:  0.20 m MV Decel Time: 433 msec     Systemic Diam: 2.00 cm MV E velocity:  137.00 cm/s Dorn Ross MD Electronically signed by Dorn Ross MD Signature Date/Time: 12/10/2023/2:33:41 PM    Final    DG Chest 2 View Result Date: 12/09/2023 EXAM: 2 VIEW(S) XRAY OF THE CHEST 12/09/2023 09:11:00 PM COMPARISON: None available. CLINICAL HISTORY: cp FINDINGS: LINES, TUBES AND DEVICES: TAVR noted. Left chest pacemaker with single lead terminating in right ventricle. LUNGS AND PLEURA: No focal pulmonary opacity. No pleural effusion. HEART AND MEDIASTINUM: Mild cardiomegaly. BONES AND SOFT TISSUES: Multilevel thoracic osteophytosis. IMPRESSION: 1. No acute cardiopulmonary abnormality. Electronically signed by: Oneil Devonshire MD 12/09/2023 09:21 PM EST RP Workstation: [id number]   CUP PACEART REMOTE DEVICE CHECK Result Date: 11/28/2023 PPM Scheduled remote reviewed. Normal device function.  Presenting rhythm: VP. Next remote transmission per protocol. - CS, CVRS  VAS US   LOWER EXTREMITY VENOUS (DVT) Result Date: 11/17/2023  Lower Venous DVT Study Patient Name:  Briana Foster  Date of Exam:   11/17/2023 Medical Rec #: [id number]         Accession #:    [id number] Date of Birth: 07/27/1945        Patient Gender: F Patient Age:   65 years Exam Location:  [address] Procedure:      VAS US  LOWER EXTREMITY VENOUS (DVT) Referring Phys: ASHLEY DONOVAN --------------------------------------------------------------------------------  Indications: Pain.  Risk Factors: Surgery 06/13/23 right knee replacement. Comparison Study: None. Performing Technologist: Garnette Rockers  Examination Guidelines: A complete evaluation includes B-mode imaging, spectral Doppler, color Doppler, and power Doppler as needed of all accessible portions of each vessel. Bilateral testing is considered an integral part of a complete examination. Limited examinations for reoccurring indications may be performed as noted. The reflux portion of the exam is performed with the patient in reverse Trendelenburg.   +---------+---------------+---------+-----------+----------+--------------+ RIGHT    CompressibilityPhasicitySpontaneityPropertiesThrombus Aging +---------+---------------+---------+-----------+----------+--------------+ CFV      Full           Yes      Yes                                 +---------+---------------+---------+-----------+----------+--------------+ SFJ      Full                                                        +---------+---------------+---------+-----------+----------+--------------+ FV Prox  Full                                                        +---------+---------------+---------+-----------+----------+--------------+ FV Mid   Full                                                        +---------+---------------+---------+-----------+----------+--------------+ FV DistalFull                                                        +---------+---------------+---------+-----------+----------+--------------+ PFV      Full                                                        +---------+---------------+---------+-----------+----------+--------------+ POP      Full           Yes      Yes                                 +---------+---------------+---------+-----------+----------+--------------+ PTV      Full  Yes                                 +---------+---------------+---------+-----------+----------+--------------+ PERO     Full                    Yes                                 +---------+---------------+---------+-----------+----------+--------------+   +----+---------------+---------+-----------+----------+--------------+ LEFTCompressibilityPhasicitySpontaneityPropertiesThrombus Aging +----+---------------+---------+-----------+----------+--------------+ CFV Full           Yes      Yes                                  +----+---------------+---------+-----------+----------+--------------+     Summary: RIGHT: - There is no evidence of deep vein thrombosis in the lower extremity.  - No cystic structure found in the popliteal fossa.  LEFT: - No evidence of common femoral vein obstruction.   *See table(s) above for measurements and observations. Electronically signed by Lonni Gaskins MD on 11/17/2023 at 1:12:28 PM.    Final    (Echo, Carotid, EGD, Colonoscopy, ERCP)    Subjective: No complaints  Discharge Exam: Vitals:   12/12/23 0430 12/12/23 0728  BP: 137/70 (!) 141/60  Pulse: 63   Resp: 16 16  Temp: 97.7 F (36.5 C) 97.9 F (36.6 C)  SpO2: 96%    Vitals:   12/11/23 1940 12/12/23 0009 12/12/23 0430 12/12/23 0728  BP: 120/66 131/72 137/70 (!) 141/60  Pulse: 67 (!) 59 63   Resp: 18 19 16 16   Temp: 98 F (36.7 C) 98 F (36.7 C) 97.7 F (36.5 C) 97.9 F (36.6 C)  TempSrc: Oral Oral Oral Oral  SpO2: 96% 98% 96%   Weight:      Height:        General: Pt is alert, awake, not in acute distress Cardiovascular: RRR, S1/S2 +, no rubs, no gallops Respiratory: CTA bilaterally, no wheezing, no rhonchi Abdominal: Soft, NT, ND, bowel sounds + Extremities: no edema, no cyanosis    The results of significant diagnostics from this hospitalization (including imaging, microbiology, ancillary and laboratory) are listed below for reference.     Microbiology: No results found for this or any previous visit (from the past 240 hours).   Labs: BNP (last 3 results) No results for input(s): BNP in the last 8760 hours. Basic Metabolic Panel: Recent Labs  Lab 12/09/23 2047 12/10/23 0747 12/11/23 0446 12/12/23 0428  NA 138 138 140 140  K 3.5 3.5 3.9 4.2  CL 102 105 105 105  CO2 21* 17* 24 23  GLUCOSE 190* 109* 122* 126*  BUN 17 19 24* 19  CREATININE 1.20* 0.98 1.11* 1.06*  CALCIUM  9.9 9.2 9.6 9.5   Liver Function Tests: Recent Labs  Lab 12/09/23 2047  AST 22  ALT 22  ALKPHOS 66   BILITOT 0.8  PROT 6.0*  ALBUMIN 4.0   No results for input(s): LIPASE, AMYLASE in the last 168 hours. No results for input(s): AMMONIA in the last 168 hours. CBC: Recent Labs  Lab 12/09/23 2047 12/10/23 0747 12/12/23 0428  WBC 8.5 7.7 6.7  NEUTROABS 5.5  --   --   HGB 14.7 13.4 14.0  HCT 43.8 39.4 41.7  MCV 90.1 90.4 92.1  PLT 194 172 180  Cardiac Enzymes: No results for input(s): CKTOTAL, CKMB, CKMBINDEX, TROPONINI in the last 168 hours. BNP: Invalid input(s): POCBNP CBG: Recent Labs  Lab 12/11/23 0747 12/11/23 1153 12/11/23 1559 12/11/23 2114 12/12/23 0731  GLUCAP 129* 137* 120* 211* 149*   D-Dimer No results for input(s): DDIMER in the last 72 hours. Hgb A1c Recent Labs    12/09/23 2047  HGBA1C 6.8*   Lipid Profile Recent Labs    12/10/23 0555  CHOL 173  HDL 39*  LDLCALC 116*  TRIG 90  CHOLHDL 4.4   Thyroid  function studies Recent Labs    12/09/23 2229  TSH 1.025   Anemia work up No results for input(s): VITAMINB12, FOLATE, FERRITIN, TIBC, IRON, RETICCTPCT in the last 72 hours. Urinalysis    Component Value Date/Time   COLORURINE YELLOW 08/01/2023 2205   APPEARANCEUR CLEAR 08/01/2023 2205   APPEARANCEUR Clear 02/16/2022 1316   LABSPEC 1.025 08/01/2023 2205   PHURINE 5.0 08/01/2023 2205   GLUCOSEU 50 (A) 08/01/2023 2205   HGBUR LARGE (A) 08/01/2023 2205   BILIRUBINUR NEGATIVE 08/01/2023 2205   BILIRUBINUR Negative 02/16/2022 1316   KETONESUR 20 (A) 08/01/2023 2205   PROTEINUR 30 (A) 08/01/2023 2205   NITRITE NEGATIVE 08/01/2023 2205   LEUKOCYTESUR NEGATIVE 08/01/2023 2205   Sepsis Labs Recent Labs  Lab 12/09/23 2047 12/10/23 0747 12/12/23 0428  WBC 8.5 7.7 6.7   Microbiology No results found for this or any previous visit (from the past 240 hours).   Time coordinating discharge: Over 30 minutes  SIGNED:   Erle Odell Castor, MD  [facility] 12/12/2023, 8:32 AM Pager   If  7PM-7AM, please contact night-coverage [email or web address] Password [email or web address]

## 2023-12-12 NOTE — Progress Notes (Addendum)
 Progress Note  Patient Name: Briana Foster Date of Encounter: 12/12/2023 [facility] Cardiologist: Arun K Thukkani, MD   Interval Summary   Patient doing really well this morning No acute complaints Cath site looks great Reviewed cath report and radial site instructions  Eager to go home  Vital Signs Vitals:   12/11/23 1940 12/12/23 0009 12/12/23 0430 12/12/23 0728  BP: 120/66 131/72 137/70 (!) 141/60  Pulse: 67 (!) 59 63   Resp: 18 19 16 16   Temp: 98 F (36.7 C) 98 F (36.7 C) 97.7 F (36.5 C) 97.9 F (36.6 C)  TempSrc: Oral Oral Oral Oral  SpO2: 96% 98% 96%   Weight:      Height:        Intake/Output Summary (Last 24 hours) at 12/12/2023 1021 Last data filed at 12/11/2023 1721 Gross per 24 hour  Intake 1069.71 ml  Output --  Net 1069.71 ml      12/10/2023   12:31 AM 12/09/2023    8:37 PM 06/13/2023   12:23 PM  Last 3 Weights  Weight (lbs) 183 lb 6.4 oz 180 lb 195 lb  Weight (kg) 83.19 kg 81.647 kg 88.451 kg     Telemetry/ECG  V-paced rhythm, HR 60s - Personally Reviewed  Physical Exam  GEN: No acute distress.   Neck: No JVD Cardiac: RRR, + murmur. Cath site healing well  Respiratory: Clear to auscultation bilaterally. GI: Soft, nontender, non-distended  MS: No edema  Assessment & Plan   Minimal nonobstructive CAD per [facility] Patient presented with chest tightness and HTN Troponin 81 ? 459 ? 9,246,underwent LHC LHC  12/12/2023: minimal nonobstructive CAD Started on ASA this admission, but will stop due to minimal CAD as she is on Eliquis   Hypertensive emergency  Presented with SBP >200 and chest tightness  BP now better controlled Continue amlodipine  10 mg daily Continue irbesartan  150 mg daily   Persistent A-fib Tachy-brady syndrome s/p PPM Currently in paced rhythm with HR 60s Not on any rate controlling medications  Restart Eliquis  5 mg BID   Severe AS s/p TAVR Echo this admission: moderate AS with mean gradient of 19.0  mmHg Continue to follow as outpatient   Hyperlipidemia  12/09/2023: ALT 22 12/10/2023: HDL 39; LDL Cholesterol 116  Statin increased this admission Continue Crestor  40 mg daily Recheck lipid panel/LFTs in 6-8 weeks  Per primary CKD stage 3a Type 2 DM RA GERD   For questions or updates, please contact [facility] Please consult [email or web address] for contact info under   Signed, Waddell DELENA Donath, PA-C   ADDENDUM:   Patient seen and examined with Waddell DELENA Donath, PA-C.  I personally taken a history, examined the patient, reviewed relevant notes,  laboratory data / imaging studies.  I performed a substantive portion of this encounter and formulated the important aspects of the plan.  I agree with the APP's note, impression, and recommendations; however, I have edited the note to reflect changes or salient points.   Patient denies anginal chest pain or heart failure symptoms. No events overnight.  PHYSICAL EXAM: Today's Vitals   12/12/23 0009 12/12/23 0316 12/12/23 0430 12/12/23 0728  BP: 131/72  137/70 (!) 141/60  Pulse: (!) 59  63   Resp: 19  16 16   Temp: 98 F (36.7 C)  97.7 F (36.5 C) 97.9 F (36.6 C)  TempSrc: Oral  Oral Oral  SpO2: 98%  96%   Weight:      Height:  PainSc:  Asleep     Body mass index is 27.89 kg/m.   Net IO Since Admission: 1,606.57 mL [12/12/23 1021]  Filed Weights   12/09/23 2037 12/10/23 0031  Weight: 81.6 kg 83.2 kg    Physical Exam  Constitutional: No distress.  hemodynamically stable  Neck: No JVD present.  Cardiovascular: Normal rate, regular rhythm, S1 normal and S2 normal. Exam reveals no gallop, no S3 and no S4.  Murmur heard. Midsystolic murmur is present with a grade of 2/6 at the upper right sternal border. Pulses:      Radial pulses are 2+ on the right side and 2+ on the left side.       Dorsalis pedis pulses are 1+ on the right side and 1+ on the left side.  Pulmonary/Chest: Effort normal and breath sounds  normal. No stridor. She has no wheezes. She has no rales.  Musculoskeletal:        General: No edema.     Cervical back: Neck supple.  Skin: Skin is warm.    EKG: (personally reviewed by me) No new tracing  Telemetry: (personally reviewed by me) V paced   Impression & Recommendations: :  NSTEMI Hypertensive emergency with SBP >200 mmHg and presenting with chest tightness and elevated cardiac biomarkers Persistent atrial fibrillation Tachybradycardia syndrome status post pacemaker implant. History of aortic stenosis status post TAVR  Patient ruled in for NSTEMI given her chest tightness and elevated cardiac biomarkers up to 9000+ Given her cardiovascular risk factors and presentation and significantly elevated cardiac biomarkers shared decision was to proceed forward with left heart catheterization to evaluate for obstructive disease. Left heart catheterization noted minimal nonobstructive disease and LVEDP of 17 with peak-to-peak gradient around 9 to 10 mmHg on pullback across the TAVR valve. It is felt that the troponin leak was likely secondary to demand ischemia in the setting of hypertensive emergency and underlying valvular heart disease No events overnight. Right radial site is healing well, no hematoma, no bruit, 2+ radial pulses Blood pressures have also improved. Still would recommend close outpatient monitoring of blood pressures and uptitration of antihypertensive medications when she follows up with PCP. Will discontinue aspirin  81 mg p.o. daily as she is currently on Eliquis  for thromboembolic prophylaxis given her history of atrial fibrillation. Continue the higher dose of Crestor  40 mg p.o. nightly given LDL of 116 mg/dL.  Recommend repeating fasting lipids and CMP in 6 weeks. Remainder the management per primary team.   Medical Decision:  Discussed management of at least 2 chronic comorbid conditions. Complexity: Moderate - Reviewed cath results and discharge  planning.  Interdisciplinary: Yes, spoke with attending physician during rounds Independently reviewed: Cath results, Labs 12/12/23, telemetry and vital signs  Prescription drug management -recommendations stated above  Further recommendations to follow as the case evolves.   This note was created using a voice recognition software as a result there may be grammatical errors inadvertently enclosed that do not reflect the nature of this encounter. Every attempt is made to correct such errors.   Madonna Michele HAS, [facility]  A Division of [facility] [address] [place], [place] [address]  Pager: [phone] Office: [phone] 12/12/2023 10:21 AM

## 2023-12-12 NOTE — Discharge Instructions (Signed)
 Radial Site Care Refer to this sheet in the next few weeks. These instructions provide you with information on caring for yourself after your procedure. Your caregiver may also give you more specific instructions. Your treatment has been planned according to current medical practices, but problems sometimes occur. Call your caregiver if you have any problems or questions after your procedure. HOME CARE INSTRUCTIONS  You may shower the day after the procedure.Remove the bandage (dressing) and gently wash the site with plain soap and water .Gently pat the site dry.   Do not apply powder or lotion to the site.   Do not submerge the affected site in water  for 3 to 5 days.   Inspect the site at least twice daily.   Do not flex or bend the affected arm for 24 hours.   No lifting over 5 pounds (2.3 kg) for 5 days after your procedure.   Do not drive home if you are discharged the same day of the procedure. Have someone else drive you.   You may drive 24 hours after the procedure unless otherwise instructed by your caregiver.  What to expect:  Any bruising will usually fade within 1 to 2 weeks.   Blood that collects in the tissue (hematoma) may be painful to the touch. It should usually decrease in size and tenderness within 1 to 2 weeks.  SEEK IMMEDIATE MEDICAL CARE IF:  You have unusual pain at the radial site.   You have redness, warmth, swelling, or pain at the radial site.   You have drainage (other than a small amount of blood on the dressing).   You have chills.   You have a fever or persistent symptoms for more than 72 hours.   You have a fever and your symptoms suddenly get worse.   Your arm becomes pale, cool, tingly, or numb.   You have heavy bleeding from the site. Hold pressure on the site.

## 2023-12-13 LAB — LIPOPROTEIN A (LPA): Lipoprotein (a): 19.8 nmol/L (ref <75.0)

## 2023-12-19 ENCOUNTER — Telehealth (HOSPITAL_COMMUNITY): Payer: Self-pay

## 2023-12-19 NOTE — Telephone Encounter (Signed)
 Attempted to call patient in regards to Cardiac Rehab - LM on VM

## 2023-12-21 DIAGNOSIS — I5032 Chronic diastolic (congestive) heart failure: Secondary | ICD-10-CM | POA: Diagnosis not present

## 2023-12-21 DIAGNOSIS — Z952 Presence of prosthetic heart valve: Secondary | ICD-10-CM | POA: Diagnosis not present

## 2023-12-21 DIAGNOSIS — H8110 Benign paroxysmal vertigo, unspecified ear: Secondary | ICD-10-CM | POA: Diagnosis not present

## 2023-12-21 DIAGNOSIS — R21 Rash and other nonspecific skin eruption: Secondary | ICD-10-CM | POA: Diagnosis not present

## 2023-12-21 DIAGNOSIS — I2510 Atherosclerotic heart disease of native coronary artery without angina pectoris: Secondary | ICD-10-CM | POA: Diagnosis not present

## 2023-12-21 DIAGNOSIS — N179 Acute kidney failure, unspecified: Secondary | ICD-10-CM | POA: Diagnosis not present

## 2023-12-21 DIAGNOSIS — I214 Non-ST elevation (NSTEMI) myocardial infarction: Secondary | ICD-10-CM | POA: Diagnosis not present

## 2023-12-21 DIAGNOSIS — I10 Essential (primary) hypertension: Secondary | ICD-10-CM | POA: Diagnosis not present

## 2024-01-09 ENCOUNTER — Ambulatory Visit: Admitting: Physician Assistant

## 2024-01-16 NOTE — Progress Notes (Deleted)
 " Cardiology Office Note   Date:  01/16/2024  ID:  Briana Foster, Briana Foster [birth year], MRN [id number] PCP: Ransom Other, MD  [facility] Providers Cardiologist:  Lurena MARLA Red, MD Electrophysiologist:  Soyla Gladis Norton, MD { Click to update primary MD,subspecialty MD or APP then REFRESH:1}    History of Present Illness Briana Foster is a 78 y.o. female with history of CAD s/p NSTEMI, LBBB, hypertension, hyperlipidemia, PPM, carotid artery disease, permanent a-fib, and severe AS s/p TAVR.     She was initially seen in 2011 for aortic stenosis and was found to be in a-fib with RVR. She has since  had several early recurrences. She underwent TAVR on 06/30/2020. Developed tachybradycardia syndrome and had PPM 10/2020. Due to difficultly to control rates, she underwent AV node ablation 07/14/2021. She was last seen in office 12/07/22 and appeared to be doing well. She has deferred SGLT2i for DM and was started on Crestor  10 mg. She was hospitalized 12/09/23 with hypertensive emergency. Cardiac catheterization showed minimal, nonobstructive disease with recommendation of medical therapy with aggressive BP control.     She is here today for hospital follow up for hypertensive emergency.   Permanent a-fib/secondary hypercoagulable state- She is followed by EP. Last seen 05/15/23 with some concerns of the cost of OAC's. EKG today *** Continue apixaban  5 mg BID.   Nonobstructive CAD- Heart catheterization 12/11/23 minimal, nonobstructive disease with recommendations of medical therapy with aggressive BP control. Today she feels *** Denies CP, SOB, and edema. Continue rosuvastatin  40 mg and amlodipine  5 mg.   Hypertension- Recent hospitalization 12/09/23 for hypertensive emergency and BP of 187/102. She checks her BP *** Continue olmesartan  20 mg and amlodipine  5 mg.   HLD with goal <70- LDL 116 12/10/23. Continue rosuvastatin  40 mg.   Carotid disease- Carotid US  07/09/2021 right ICA 60-79%  stenosis and left ICA 1-39 % stenosis. She denies ***  Pacemaker- Last seen by EP 05/15/23. Normal PPM function noted.    DMII- Recent A1C 6.8 12/09/23. Managed by PCP.   S/p TAVR- Echo 12/10/23 shows no aortic valve regurgitation and some moderate stenosis.   ROS: All systems negative unless otherwise indicated in HPI.  Studies Reviewed      *** Risk Assessment/Calculations {Does this patient have ATRIAL FIBRILLATION?:[id number]} No BP recorded.  {Refresh Note OR Click here to enter BP  :1}***       Physical Exam VS:  There were no vitals taken for this visit.       Wt Readings from Last 3 Encounters:  12/10/23 183 lb 6.4 oz (83.2 kg)  06/13/23 195 lb (88.5 kg)  06/01/23 195 lb (88.5 kg)    GEN: Well nourished, well developed in no acute distress NECK: No JVD; No carotid bruits CARDIAC: ***RRR, no murmurs, rubs, gallops RESPIRATORY:  Clear to auscultation without rales, wheezing or rhonchi  ABDOMEN: Soft, non-tender, non-distended EXTREMITIES:  No edema; No deformity   ASSESSMENT AND PLAN *** {The patient has an active order for outpatient cardiac rehabilitation.   Please indicate if the patient is ready to start. Do NOT delete this.  It will auto delete.  Refresh note, then sign.              Click here to document readiness and see contraindications.  :1}  Cardiac Rehabilitation Eligibility Assessment      {Are you ordering a CV Procedure (e.g. stress test, cath, DCCV, TEE, etc)?   Press F2        :  [id number]}  Dispo: ***  Signed, Mardy KATHEE Pizza, FNP  "

## 2024-01-18 ENCOUNTER — Ambulatory Visit: Attending: Cardiovascular Disease | Admitting: General Practice

## 2024-01-25 ENCOUNTER — Emergency Department (HOSPITAL_COMMUNITY)

## 2024-01-25 ENCOUNTER — Encounter (HOSPITAL_COMMUNITY): Payer: Self-pay

## 2024-01-25 ENCOUNTER — Other Ambulatory Visit: Payer: Self-pay

## 2024-01-25 ENCOUNTER — Inpatient Hospital Stay (HOSPITAL_COMMUNITY)
Admission: EM | Admit: 2024-01-25 | Discharge: 2024-01-28 | DRG: 193 | Disposition: A | Discharge status: Home Health | Attending: Student | Admitting: Student

## 2024-01-25 DIAGNOSIS — I10 Essential (primary) hypertension: Secondary | ICD-10-CM | POA: Diagnosis present

## 2024-01-25 DIAGNOSIS — J9601 Acute respiratory failure with hypoxia: Secondary | ICD-10-CM | POA: Diagnosis present

## 2024-01-25 DIAGNOSIS — J159 Unspecified bacterial pneumonia: Secondary | ICD-10-CM | POA: Diagnosis present

## 2024-01-25 DIAGNOSIS — I2510 Atherosclerotic heart disease of native coronary artery without angina pectoris: Secondary | ICD-10-CM | POA: Diagnosis present

## 2024-01-25 DIAGNOSIS — Z79899 Other long term (current) drug therapy: Secondary | ICD-10-CM

## 2024-01-25 DIAGNOSIS — K219 Gastro-esophageal reflux disease without esophagitis: Secondary | ICD-10-CM | POA: Diagnosis present

## 2024-01-25 DIAGNOSIS — G2581 Restless legs syndrome: Secondary | ICD-10-CM | POA: Diagnosis present

## 2024-01-25 DIAGNOSIS — M069 Rheumatoid arthritis, unspecified: Secondary | ICD-10-CM | POA: Diagnosis present

## 2024-01-25 DIAGNOSIS — E1122 Type 2 diabetes mellitus with diabetic chronic kidney disease: Secondary | ICD-10-CM | POA: Diagnosis present

## 2024-01-25 DIAGNOSIS — I4819 Other persistent atrial fibrillation: Secondary | ICD-10-CM | POA: Diagnosis present

## 2024-01-25 DIAGNOSIS — J45909 Unspecified asthma, uncomplicated: Secondary | ICD-10-CM | POA: Diagnosis present

## 2024-01-25 DIAGNOSIS — Z7901 Long term (current) use of anticoagulants: Secondary | ICD-10-CM

## 2024-01-25 DIAGNOSIS — N1831 Chronic kidney disease, stage 3a: Secondary | ICD-10-CM | POA: Diagnosis present

## 2024-01-25 DIAGNOSIS — J1000 Influenza due to other identified influenza virus with unspecified type of pneumonia: Secondary | ICD-10-CM | POA: Diagnosis present

## 2024-01-25 DIAGNOSIS — I5032 Chronic diastolic (congestive) heart failure: Secondary | ICD-10-CM | POA: Diagnosis present

## 2024-01-25 DIAGNOSIS — Z7951 Long term (current) use of inhaled steroids: Secondary | ICD-10-CM

## 2024-01-25 DIAGNOSIS — R0902 Hypoxemia: Principal | ICD-10-CM

## 2024-01-25 DIAGNOSIS — Z952 Presence of prosthetic heart valve: Secondary | ICD-10-CM

## 2024-01-25 DIAGNOSIS — Z96651 Presence of right artificial knee joint: Secondary | ICD-10-CM | POA: Diagnosis present

## 2024-01-25 DIAGNOSIS — Z87442 Personal history of urinary calculi: Secondary | ICD-10-CM

## 2024-01-25 DIAGNOSIS — I252 Old myocardial infarction: Secondary | ICD-10-CM

## 2024-01-25 DIAGNOSIS — I130 Hypertensive heart and chronic kidney disease with heart failure and stage 1 through stage 4 chronic kidney disease, or unspecified chronic kidney disease: Secondary | ICD-10-CM | POA: Diagnosis present

## 2024-01-25 DIAGNOSIS — E785 Hyperlipidemia, unspecified: Secondary | ICD-10-CM | POA: Diagnosis present

## 2024-01-25 DIAGNOSIS — D631 Anemia in chronic kidney disease: Secondary | ICD-10-CM | POA: Diagnosis present

## 2024-01-25 DIAGNOSIS — Z950 Presence of cardiac pacemaker: Secondary | ICD-10-CM

## 2024-01-25 DIAGNOSIS — I350 Nonrheumatic aortic (valve) stenosis: Secondary | ICD-10-CM | POA: Diagnosis present

## 2024-01-25 DIAGNOSIS — Z7984 Long term (current) use of oral hypoglycemic drugs: Secondary | ICD-10-CM

## 2024-01-25 DIAGNOSIS — J988 Other specified respiratory disorders: Secondary | ICD-10-CM | POA: Diagnosis present

## 2024-01-25 DIAGNOSIS — I4821 Permanent atrial fibrillation: Secondary | ICD-10-CM | POA: Diagnosis present

## 2024-01-25 DIAGNOSIS — Z823 Family history of stroke: Secondary | ICD-10-CM

## 2024-01-25 DIAGNOSIS — Z8249 Family history of ischemic heart disease and other diseases of the circulatory system: Secondary | ICD-10-CM

## 2024-01-25 DIAGNOSIS — E1165 Type 2 diabetes mellitus with hyperglycemia: Secondary | ICD-10-CM | POA: Diagnosis present

## 2024-01-25 DIAGNOSIS — J101 Influenza due to other identified influenza virus with other respiratory manifestations: Principal | ICD-10-CM | POA: Diagnosis present

## 2024-01-25 LAB — BASIC METABOLIC PANEL WITH GFR
Anion gap: 13 (ref 5–15)
BUN: 12 mg/dL (ref 8–23)
CO2: 23 mmol/L (ref 22–32)
Calcium: 9.6 mg/dL (ref 8.9–10.3)
Chloride: 99 mmol/L (ref 98–111)
Creatinine, Ser: 0.82 mg/dL (ref 0.44–1.00)
GFR, Estimated: >60 mL/min
Glucose, Bld: 116 mg/dL — ABNORMAL HIGH (ref 70–99)
Potassium: 3.7 mmol/L (ref 3.5–5.1)
Sodium: 135 mmol/L (ref 135–145)

## 2024-01-25 LAB — CBC
HCT: 41.3 % (ref 36.0–46.0)
Hemoglobin: 13.9 g/dL (ref 12.0–15.0)
MCH: 31.1 pg (ref 26.0–34.0)
MCHC: 33.7 g/dL (ref 30.0–36.0)
MCV: 92.4 fL (ref 80.0–100.0)
Platelets: 132 K/uL — ABNORMAL LOW (ref 150–400)
RBC: 4.47 MIL/uL (ref 3.87–5.11)
RDW: 13.4 % (ref 11.5–15.5)
WBC: 6.3 K/uL (ref 4.0–10.5)
nRBC: 0 % (ref 0.0–0.2)

## 2024-01-25 LAB — RESP PANEL BY RT-PCR (RSV, FLU A&B, COVID)  RVPGX2
Influenza A by PCR: POSITIVE — AB
Influenza B by PCR: NEGATIVE
Resp Syncytial Virus by PCR: NEGATIVE
SARS Coronavirus 2 by RT PCR: NEGATIVE

## 2024-01-25 MED ORDER — ONDANSETRON HCL 4 MG/2ML IJ SOLN
4.0000 mg | Freq: Once | INTRAMUSCULAR | Status: AC
  Administered 2024-01-25: 4 mg via INTRAVENOUS
  Filled 2024-01-25: qty 2

## 2024-01-25 MED ORDER — ACETAMINOPHEN 325 MG PO TABS
975.0000 mg | ORAL_TABLET | Freq: Once | ORAL | Status: AC
  Administered 2024-01-25: 975 mg via ORAL
  Filled 2024-01-25: qty 3

## 2024-01-25 MED ORDER — SODIUM CHLORIDE 0.9 % IV BOLUS
500.0000 mL | Freq: Once | INTRAVENOUS | Status: AC
  Administered 2024-01-25: 500 mL via INTRAVENOUS

## 2024-01-25 NOTE — ED Triage Notes (Signed)
 Patient brought in for shortness of breath on exertion with dry cough. Also reports decreased appetite. Patient denies pain anywhere. Patient reports other people in the home sick.

## 2024-01-26 ENCOUNTER — Encounter (HOSPITAL_COMMUNITY): Payer: Self-pay | Admitting: Internal Medicine

## 2024-01-26 ENCOUNTER — Observation Stay (HOSPITAL_COMMUNITY)

## 2024-01-26 DIAGNOSIS — J101 Influenza due to other identified influenza virus with other respiratory manifestations: Secondary | ICD-10-CM | POA: Diagnosis present

## 2024-01-26 DIAGNOSIS — G2581 Restless legs syndrome: Secondary | ICD-10-CM | POA: Diagnosis present

## 2024-01-26 DIAGNOSIS — Z950 Presence of cardiac pacemaker: Secondary | ICD-10-CM | POA: Diagnosis not present

## 2024-01-26 DIAGNOSIS — D631 Anemia in chronic kidney disease: Secondary | ICD-10-CM | POA: Diagnosis present

## 2024-01-26 DIAGNOSIS — I350 Nonrheumatic aortic (valve) stenosis: Secondary | ICD-10-CM | POA: Diagnosis present

## 2024-01-26 DIAGNOSIS — N1831 Chronic kidney disease, stage 3a: Secondary | ICD-10-CM | POA: Diagnosis present

## 2024-01-26 DIAGNOSIS — I2510 Atherosclerotic heart disease of native coronary artery without angina pectoris: Secondary | ICD-10-CM | POA: Diagnosis present

## 2024-01-26 DIAGNOSIS — Z7901 Long term (current) use of anticoagulants: Secondary | ICD-10-CM | POA: Diagnosis not present

## 2024-01-26 DIAGNOSIS — E1165 Type 2 diabetes mellitus with hyperglycemia: Secondary | ICD-10-CM

## 2024-01-26 DIAGNOSIS — I4819 Other persistent atrial fibrillation: Secondary | ICD-10-CM | POA: Diagnosis not present

## 2024-01-26 DIAGNOSIS — E1122 Type 2 diabetes mellitus with diabetic chronic kidney disease: Secondary | ICD-10-CM | POA: Diagnosis present

## 2024-01-26 DIAGNOSIS — I4821 Permanent atrial fibrillation: Secondary | ICD-10-CM | POA: Diagnosis present

## 2024-01-26 DIAGNOSIS — M069 Rheumatoid arthritis, unspecified: Secondary | ICD-10-CM | POA: Diagnosis present

## 2024-01-26 DIAGNOSIS — J45909 Unspecified asthma, uncomplicated: Secondary | ICD-10-CM | POA: Diagnosis present

## 2024-01-26 DIAGNOSIS — Z8249 Family history of ischemic heart disease and other diseases of the circulatory system: Secondary | ICD-10-CM | POA: Diagnosis not present

## 2024-01-26 DIAGNOSIS — Z952 Presence of prosthetic heart valve: Secondary | ICD-10-CM | POA: Diagnosis not present

## 2024-01-26 DIAGNOSIS — J159 Unspecified bacterial pneumonia: Secondary | ICD-10-CM | POA: Diagnosis present

## 2024-01-26 DIAGNOSIS — J1000 Influenza due to other identified influenza virus with unspecified type of pneumonia: Secondary | ICD-10-CM | POA: Diagnosis present

## 2024-01-26 DIAGNOSIS — J9601 Acute respiratory failure with hypoxia: Secondary | ICD-10-CM

## 2024-01-26 DIAGNOSIS — I130 Hypertensive heart and chronic kidney disease with heart failure and stage 1 through stage 4 chronic kidney disease, or unspecified chronic kidney disease: Secondary | ICD-10-CM | POA: Diagnosis present

## 2024-01-26 DIAGNOSIS — E785 Hyperlipidemia, unspecified: Secondary | ICD-10-CM | POA: Diagnosis present

## 2024-01-26 DIAGNOSIS — Z96651 Presence of right artificial knee joint: Secondary | ICD-10-CM | POA: Diagnosis present

## 2024-01-26 DIAGNOSIS — Z7984 Long term (current) use of oral hypoglycemic drugs: Secondary | ICD-10-CM | POA: Diagnosis not present

## 2024-01-26 DIAGNOSIS — Z7951 Long term (current) use of inhaled steroids: Secondary | ICD-10-CM | POA: Diagnosis not present

## 2024-01-26 DIAGNOSIS — I5032 Chronic diastolic (congestive) heart failure: Secondary | ICD-10-CM | POA: Diagnosis present

## 2024-01-26 LAB — COMPREHENSIVE METABOLIC PANEL WITH GFR
ALT: 19 U/L (ref 0–44)
AST: 24 U/L (ref 15–41)
Albumin: 3.9 g/dL (ref 3.5–5.0)
Alkaline Phosphatase: 65 U/L (ref 38–126)
Anion gap: 13 (ref 5–15)
BUN: 14 mg/dL (ref 8–23)
CO2: 23 mmol/L (ref 22–32)
Calcium: 9.1 mg/dL (ref 8.9–10.3)
Chloride: 101 mmol/L (ref 98–111)
Creatinine, Ser: 0.8 mg/dL (ref 0.44–1.00)
GFR, Estimated: >60 mL/min
Glucose, Bld: 152 mg/dL — ABNORMAL HIGH (ref 70–99)
Potassium: 3.5 mmol/L (ref 3.5–5.1)
Sodium: 137 mmol/L (ref 135–145)
Total Bilirubin: 1 mg/dL (ref 0.0–1.2)
Total Protein: 6.4 g/dL — ABNORMAL LOW (ref 6.5–8.1)

## 2024-01-26 LAB — CBC
HCT: 38 % (ref 36.0–46.0)
Hemoglobin: 12.9 g/dL (ref 12.0–15.0)
MCH: 31 pg (ref 26.0–34.0)
MCHC: 33.9 g/dL (ref 30.0–36.0)
MCV: 91.3 fL (ref 80.0–100.0)
Platelets: 120 K/uL — ABNORMAL LOW (ref 150–400)
RBC: 4.16 MIL/uL (ref 3.87–5.11)
RDW: 13.6 % (ref 11.5–15.5)
WBC: 7 K/uL (ref 4.0–10.5)
nRBC: 0 % (ref 0.0–0.2)

## 2024-01-26 LAB — GLUCOSE, CAPILLARY
Glucose-Capillary: 107 mg/dL — ABNORMAL HIGH (ref 70–99)
Glucose-Capillary: 190 mg/dL — ABNORMAL HIGH (ref 70–99)

## 2024-01-26 LAB — CBG MONITORING, ED
Glucose-Capillary: 153 mg/dL — ABNORMAL HIGH (ref 70–99)
Glucose-Capillary: 129 mg/dL — ABNORMAL HIGH (ref 70–99)

## 2024-01-26 MED ORDER — ROSUVASTATIN CALCIUM 20 MG PO TABS
40.0000 mg | ORAL_TABLET | Freq: Every day | ORAL | Status: DC
  Administered 2024-01-26 – 2024-01-28 (×3): 40 mg via ORAL
  Filled 2024-01-26 (×3): qty 2

## 2024-01-26 MED ORDER — PROCHLORPERAZINE EDISYLATE 10 MG/2ML IJ SOLN
5.0000 mg | Freq: Once | INTRAMUSCULAR | Status: AC
  Administered 2024-01-26: 5 mg via INTRAVENOUS
  Filled 2024-01-26: qty 2

## 2024-01-26 MED ORDER — ROPINIROLE HCL 0.5 MG PO TABS
4.0000 mg | ORAL_TABLET | Freq: Every day | ORAL | Status: DC
  Administered 2024-01-26 – 2024-01-27 (×3): 4 mg via ORAL
  Filled 2024-01-26: qty 8
  Filled 2024-01-26: qty 4
  Filled 2024-01-26: qty 8

## 2024-01-26 MED ORDER — AMLODIPINE BESYLATE 10 MG PO TABS
10.0000 mg | ORAL_TABLET | Freq: Every day | ORAL | Status: DC
  Administered 2024-01-26 – 2024-01-28 (×3): 10 mg via ORAL
  Filled 2024-01-26: qty 2
  Filled 2024-01-26 (×2): qty 1

## 2024-01-26 MED ORDER — SODIUM CHLORIDE 0.9 % IV SOLN
1.0000 g | INTRAVENOUS | Status: DC
  Administered 2024-01-26: 1 g via INTRAVENOUS
  Filled 2024-01-26: qty 10

## 2024-01-26 MED ORDER — INSULIN ASPART 100 UNIT/ML IJ SOLN
0.0000 [IU] | Freq: Three times a day (TID) | INTRAMUSCULAR | Status: DC
  Administered 2024-01-26: 1 [IU] via SUBCUTANEOUS
  Administered 2024-01-26 – 2024-01-27 (×2): 2 [IU] via SUBCUTANEOUS
  Administered 2024-01-27: 1 [IU] via SUBCUTANEOUS
  Administered 2024-01-28: 2 [IU] via SUBCUTANEOUS
  Filled 2024-01-26: qty 3
  Filled 2024-01-26 (×2): qty 2
  Filled 2024-01-26: qty 1

## 2024-01-26 MED ORDER — SODIUM CHLORIDE 0.9 % IV SOLN
100.0000 mg | Freq: Two times a day (BID) | INTRAVENOUS | Status: DC
  Administered 2024-01-26 – 2024-01-27 (×4): 100 mg via INTRAVENOUS
  Filled 2024-01-26 (×5): qty 100

## 2024-01-26 MED ORDER — ALUM & MAG HYDROXIDE-SIMETH 200-200-20 MG/5ML PO SUSP: 15.0000 mL | Freq: Once | ORAL | Status: DC

## 2024-01-26 MED ORDER — IRBESARTAN 150 MG PO TABS
150.0000 mg | ORAL_TABLET | Freq: Every day | ORAL | Status: DC
  Administered 2024-01-26 – 2024-01-28 (×3): 150 mg via ORAL
  Filled 2024-01-26 (×3): qty 1

## 2024-01-26 MED ORDER — PANTOPRAZOLE SODIUM 40 MG PO TBEC
40.0000 mg | DELAYED_RELEASE_TABLET | Freq: Once | ORAL | Status: AC
  Administered 2024-01-26: 40 mg via ORAL
  Filled 2024-01-26: qty 1

## 2024-01-26 MED ORDER — ACETAMINOPHEN 650 MG RE SUPP: 650.0000 mg | Freq: Four times a day (QID) | RECTAL | Status: DC | PRN

## 2024-01-26 MED ORDER — OSELTAMIVIR PHOSPHATE 75 MG PO CAPS
75.0000 mg | ORAL_CAPSULE | Freq: Two times a day (BID) | ORAL | Status: DC
  Administered 2024-01-26 – 2024-01-28 (×5): 75 mg via ORAL
  Filled 2024-01-26 (×8): qty 1

## 2024-01-26 MED ORDER — ACETAMINOPHEN 325 MG PO TABS
650.0000 mg | ORAL_TABLET | Freq: Four times a day (QID) | ORAL | Status: DC | PRN
  Administered 2024-01-26: 650 mg via ORAL
  Filled 2024-01-26: qty 2

## 2024-01-26 MED ORDER — SODIUM CHLORIDE 0.9 % IV SOLN
2.0000 g | INTRAVENOUS | Status: DC
  Administered 2024-01-27: 2 g via INTRAVENOUS
  Filled 2024-01-26: qty 20

## 2024-01-26 MED ORDER — IPRATROPIUM-ALBUTEROL 0.5-2.5 (3) MG/3ML IN SOLN
3.0000 mL | Freq: Once | RESPIRATORY_TRACT | Status: AC
  Administered 2024-01-26: 3 mL via RESPIRATORY_TRACT
  Filled 2024-01-26: qty 3

## 2024-01-26 MED ORDER — POLYETHYLENE GLYCOL 3350 17 G PO PACK
17.0000 g | PACK | Freq: Every day | ORAL | Status: DC
  Filled 2024-01-26 (×3): qty 1

## 2024-01-26 MED ORDER — OSELTAMIVIR PHOSPHATE 75 MG PO CAPS
75.0000 mg | ORAL_CAPSULE | Freq: Once | ORAL | Status: AC
  Administered 2024-01-26: 75 mg via ORAL
  Filled 2024-01-26: qty 1

## 2024-01-26 MED ORDER — MEMANTINE HCL 10 MG PO TABS
5.0000 mg | ORAL_TABLET | Freq: Two times a day (BID) | ORAL | Status: DC
  Administered 2024-01-26 – 2024-01-28 (×5): 5 mg via ORAL
  Filled 2024-01-26 (×5): qty 1

## 2024-01-26 MED ORDER — APIXABAN 5 MG PO TABS
5.0000 mg | ORAL_TABLET | Freq: Two times a day (BID) | ORAL | Status: DC
  Administered 2024-01-26 – 2024-01-28 (×5): 5 mg via ORAL
  Filled 2024-01-26 (×5): qty 1

## 2024-01-26 NOTE — Progress Notes (Signed)
 Physical Therapy Evaluation Patient Details Name: Briana Foster MRN: [id number] DOB: 02-21-1945 Today's Date: 01/26/2024  History of Present Illness  Briana Foster is a 78 y.o. female admitted with flu.  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  Clinical Impression  Pt admitted with above diagnosis. Pt was able to stand at EOB with mod assist due to pt couldn't take steps even with bil UE support. Pt attempted several times and limited by DOE3/4 and reports of fatigue and weakness. Pt did not desaturate below 92% on RA.  Will continue to follow pt acutely. Pt states she has 24 hour care at home and wants to go home with HHPT.  Pt currently with functional limitations due to the deficits listed below (see PT Problem List). Pt will benefit from acute skilled PT to increase their independence and safety with mobility to allow discharge.           If plan is discharge home, recommend the following: A little help with walking and/or transfers;A little help with bathing/dressing/bathroom;Assistance with cooking/housework;Assist for transportation;Help with stairs or ramp for entrance   Can travel by private vehicle        Equipment Recommendations None recommended by PT  Recommendations for Other Services       Functional Status Assessment Patient has had a recent decline in their functional status and demonstrates the ability to make significant improvements in function in a reasonable and predictable amount of time.     Precautions / Restrictions Precautions Precautions: Fall Restrictions Weight Bearing Restrictions Per Provider Order: No      Mobility  Bed Mobility Overal bed mobility: Needs Assistance Bed Mobility:  Supine to Sit     Supine to sit: Min assist     General bed mobility comments: Pt needed a little assist to come to EOB.  DOE 3/4.  Pt did not desaturate below 92% on RA entire session.    Transfers Overall transfer level: Needs assistance Equipment used: 2 person hand held assist, 1 person hand held assist Transfers: Sit to/from Stand Sit to Stand: Mod assist           General transfer comment: Stood x 3 x with HHA of 1 vs. HHA of 2.  Even wtih HHA of 2 pt could not advance steps. She could side step however she couldnt step forward.  Assisted pt back to stretcher.Fatigues quickly and gets winded quickly.    Ambulation/Gait               General Gait Details: Unable  Stairs            Wheelchair Mobility     Tilt Bed    Modified Rankin (Stroke Patients Only)       Balance                                             Pertinent Vitals/Pain Pain Assessment Pain Assessment: Faces Faces Pain Scale: Hurts even more Pain Location: back Pain Descriptors / Indicators: Aching, Discomfort, Grimacing, Guarding Pain Intervention(s): Limited activity within patient's tolerance, Monitored during session, Repositioned    Home Living Family/patient expects to be discharged to:: Private residence Living  Arrangements: Children Available Help at Discharge: Family;Available 24 hours/day (daughter she lives with works but pt has other family that come stay with her) Type of Home: House Home Access: Stairs to enter Entrance Stairs-Rails: Right;Left;Can reach both Secretary/administrator of Steps: 3   Home Layout: One level Home Equipment: Rollator (4 wheels);Cane - single point;Shower seat - built in;BSC/3in1      Prior Function Prior Level of Function : Independent/Modified Independent             Mobility Comments: pt reports using hurry-cane as needed ADLs Comments: pt reports ind     Extremity/Trunk Assessment   Upper Extremity  Assessment Upper Extremity Assessment: Defer to OT evaluation    Lower Extremity Assessment Lower Extremity Assessment: RLE deficits/detail;LLE deficits/detail RLE Deficits / Details: grossly 3-/5 LLE Deficits / Details: grossly 3-/5    Cervical / Trunk Assessment Cervical / Trunk Assessment: Kyphotic  Communication   Communication Communication: No apparent difficulties    Cognition Arousal: Alert Behavior During Therapy: WFL for tasks assessed/performed   PT - Cognitive impairments: No apparent impairments                         Following commands: Intact       Cueing Cueing Techniques: Verbal cues, Tactile cues     General Comments General comments (skin integrity, edema, etc.): 65 bpm, 92% with RA initially and didnt drop below 92% entire session.    Exercises General Exercises - Lower Extremity Ankle Circles/Pumps: AROM, Both, 10 reps, Seated Long Arc Quad: AROM, Both, 10 reps, Seated   Assessment/Plan    PT Assessment Patient needs continued PT services  PT Problem List Decreased activity tolerance;Decreased balance;Decreased mobility;Decreased safety awareness;Decreased knowledge of precautions;Cardiopulmonary status limiting activity       PT Treatment Interventions DME instruction;Gait training;Functional mobility training;Therapeutic activities;Therapeutic exercise;Patient/family education    PT Goals (Current goals can be found in the Care Plan section)  Acute Rehab PT Goals Patient Stated Goal: to go home with family PT Goal Formulation: With patient Time For Goal Achievement: 02/09/24 Potential to Achieve Goals: Good    Frequency Min 2X/week     Co-evaluation               AM-PAC PT 6 Clicks Mobility  Outcome Measure Help needed turning from your back to your side while in a flat bed without using bedrails?: A Little Help needed moving from lying on your back to sitting on the side of a flat bed without using bedrails?: A  Little Help needed moving to and from a bed to a chair (including a wheelchair)?: A Little Help needed standing up from a chair using your arms (e.g., wheelchair or bedside chair)?: A Lot Help needed to walk in hospital room?: Total Help needed climbing 3-5 steps with a railing? : Total 6 Click Score: 13    End of Session Equipment Utilized During Treatment: Gait belt;Oxygen Activity Tolerance: Patient limited by fatigue Patient left: with call bell/phone within reach (on stretcher) Nurse Communication: Mobility status PT Visit Diagnosis: Muscle weakness (generalized) (M62.81)    Time: 8762-8744 PT Time Calculation (min) (ACUTE ONLY): 18 min   Charges:   PT Evaluation $PT Eval Moderate Complexity: 1 Mod   PT General Charges $$ ACUTE PT VISIT: 1 Visit         Tiron Suski M,PT Acute Rehab Services [phone]   Stephane JULIANNA Bevel 01/26/2024, 2:47 PM

## 2024-01-26 NOTE — Progress Notes (Addendum)
 Briana Foster is a 78 y.o. 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    She presented to the hospital because of not productive cough, general weakness, poor appetite for about 2 days duration.  She said her granddaughter had been sick with similar symptoms.  Vital signs in the ED: Temperature 99.2 F, respiratory 20, pulse 63, BP 155/63, oxygen saturation 93% on room air. Subsequently placed on 2 L of oxygen for concern for hypoxia due to did not see any documented hypoxia in the chart.  She tested positive for influenza A.  Chest x-ray:   IMPRESSION: Interstitial coarsening, more so in the lung bases, may represent atypical infection.    She was admitted to the hospital for influenza A infection and possible secondary bacterial pneumonia.  She was started on Tamiflu  and empiric IV ceftriaxone  and azithromycin .   She is seen at the bedside today in the emergency department. She feels a little better.  She still has a cough.  No shortness of breath or chest pain.  No abdominal pain or vomiting.  Vital signs are stable.  I asked the nurse to discontinue 2 L of oxygen and check oxygen saturation on room air.  Oxygen saturation was 94% on room air.  Continue Tamiflu  and empiric IV antibiotics for now.  Patient has difficulty ambulating because of general weakness.   Obtain PT consult and check pulse oximetry with ambulation  Abdominal x-ray done today showed moderate gastric distention with gas otherwise nonobstructive bowel gas pattern.  Will hold off on NG tube for now since patient is asymptomatic.  Possible discharge to home tomorrow if she continues to feel better.

## 2024-01-26 NOTE — Evaluation (Signed)
 Occupational Therapy Evaluation Patient Details Name: Briana Foster MRN: [id number] DOB: 03-30-1945 Today's Date: 01/26/2024   History of Present Illness   Briana Foster is a 78 y.o. female admitted with flu.  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     Clinical Impressions Pt currently at max assist for simulated toilet transfers and mod assist for selfcare tasks sit to stand.  Oxygen sats remained above 90% throughout session on room air.  Increased difficulty taking steps without assistive device and with hand held assist from therapist.  Pt reports having her daughter living with her currently but she is not available 24/7.  Per chart other family/friends may be able to assist.  She was overall modified independent at baseline with occasional use of a single point cane for mobility and shower seat for safety.  Feel she will benefit from acute care OT at this time to help progress back to a safe level for discharge home.  Depending on progress, pt may benefit from initial inpatient follow up therapy, <3 hours/day, until she progresses to a safer level for return home.        If plan is discharge home, recommend the following:   A lot of help with walking and/or transfers;A lot of help with bathing/dressing/bathroom;Help with stairs or ramp for entrance;Assist for transportation     Functional Status Assessment   Patient has had a recent decline in their functional status and demonstrates the ability to make significant improvements in function in a reasonable and predictable amount of time.     Equipment Recommendations   None recommended by OT     Recommendations for Other Services          Precautions/Restrictions   Precautions Precautions: Fall Restrictions Weight Bearing Restrictions Per Provider Order: No     Mobility Bed Mobility Overal bed mobility: Needs Assistance Bed Mobility: Supine to Sit     Supine to sit: Min assist     General bed mobility comments: Min assist to bring trunk up to sitting with HOB already raised up.    Transfers Overall transfer level: Needs assistance Equipment used: 1 person hand held assist Transfers: Sit to/from Stand, Bed to chair/wheelchair/BSC Sit to Stand: Max assist     Step pivot transfers: Max assist, +2 physical assistance     General transfer comment: Increased difficulty with sit to stand from the bedside straight back chair.  Multiple LOB noted posteriorly.  Difficutly advancing LEs once she attempted ambulation.  Will benefit from RW for next trial.      Balance Overall balance assessment: Needs assistance Sitting-balance support: Feet supported, Bilateral upper extremity supported Sitting balance-Leahy Scale: Fair     Standing balance support: During functional activity Standing balance-Leahy Scale: Poor Standing balance comment: Max assist for dynamic standing balance without use of an assistive device.                           ADL either performed or assessed with clinical judgement   ADL Overall ADL's : Needs assistance/impaired Eating/Feeding: Set up;Sitting   Grooming: Wash/dry hands;Wash/dry face;Sitting;Set up   Upper Body Bathing: Supervision/ safety;Sitting   Lower Body Bathing: Moderate assistance;Sit to/from stand   Upper Body Dressing : Supervision/safety;Sitting   Lower Body  Dressing: Sit to/from stand;Moderate assistance   Toilet Transfer: Maximal assistance;Ambulation Toilet Transfer Details (indicate cue type and reason): simulated in the room Toileting- Clothing Manipulation and Hygiene: Moderate assistance;Sit to/from stand       Functional mobility during  ADLs: Maximal assistance (ambulation without assistive device) General ADL Comments: Pt with O2 sats at 90-93% during session with functional mobility.  Increased difficulty noted with taking steps with multiple losses of balance during short distance mobility to the bedside chair and back with only hand held assist.     Vision Baseline Vision/History: 0 No visual deficits Ability to See in Adequate Light: 0 Adequate Patient Visual Report: No change from baseline Vision Assessment?: Wears glasses for reading     Perception Perception: Not tested       Praxis Praxis: Not tested       Pertinent Vitals/Pain Pain Assessment Pain Assessment: Faces Faces Pain Scale: Hurts little more Pain Location: back Pain Descriptors / Indicators: Discomfort, Guarding Pain Intervention(s): Limited activity within patient's tolerance     Extremity/Trunk Assessment Upper Extremity Assessment Upper Extremity Assessment: Generalized weakness   Lower Extremity Assessment Lower Extremity Assessment: Defer to PT evaluation RLE Deficits / Details: grossly 3-/5 LLE Deficits / Details: grossly 3-/5   Cervical / Trunk Assessment Cervical / Trunk Assessment: Kyphotic   Communication Communication Communication: No apparent difficulties   Cognition Arousal: Alert Behavior During Therapy: WFL for tasks assessed/performed Cognition: No family/caregiver present to determine baseline             OT - Cognition Comments: Pt oriented to place, time, and situation.  Good sustained attention throughout most of the session but did become sleepy at the end.                 Following commands: Intact       Cueing  General Comments   Cueing Techniques: Verbal cues  65 bpm, 92% with RA initially and didnt drop below 92% entire session.           Home Living Family/patient expects to be discharged to:: Private residence Living Arrangements: Children Available Help at Discharge:  Family;Available 24 hours/day (daughter she lives with works but pt has other family that come stay with her) Type of Home: House Home Access: Stairs to enter Secretary/administrator of Steps: 3 Entrance Stairs-Rails: Right;Left;Can reach both Home Layout: One level     Bathroom Shower/Tub: Walk-in shower;Door   [employer] Toilet: Standard Bathroom Accessibility: Yes   Home Equipment: Rollator (4 wheels);Cane - single point;Shower seat - built in;BSC/3in1          Prior Functioning/Environment Prior Level of Function : Independent/Modified Independent             Mobility Comments: pt reports using hurry-cane as needed ADLs Comments: pt reports ind    OT Problem List: Decreased strength;Impaired balance (sitting and/or standing);Pain;Decreased activity tolerance;Decreased knowledge of use of DME or AE   OT Treatment/Interventions: Self-care/ADL training;Patient/family education;Balance training;Neuromuscular education;Energy conservation;Therapeutic activities;DME and/or AE instruction      OT Goals(Current goals can be found in the care plan section)   Acute Rehab OT Goals Patient Stated Goal: Pt did not state but she was agreeable to working with OT. OT Goal Formulation: With patient Time For Goal Achievement: 02/09/24 Potential to Achieve Goals: Good   OT Frequency:  Min 2X/week       AM-PAC OT 6 Clicks Daily Activity     Outcome Measure Help from another person eating meals?: None  Help from another person taking care of personal grooming?: A Little Help from another person toileting, which includes using toliet, bedpan, or urinal?: A Lot Help from another person bathing (including washing, rinsing, drying)?: A Lot Help from another person to put on and taking off regular upper body clothing?: A Little Help from another person to put on and taking off regular lower body clothing?: A Lot 6 Click Score: 16   End of Session Equipment Utilized During Treatment:  Gait belt Nurse Communication: Mobility status  Activity Tolerance: Patient limited by fatigue Patient left: in bed;with call bell/phone within reach  OT Visit Diagnosis: Unsteadiness on feet (R26.81);Muscle weakness (generalized) (M62.81);Other abnormalities of gait and mobility (R26.89)                Time: 8588-8546 OT Time Calculation (min): 42 min Charges:  OT General Charges $OT Visit: 1 Visit OT Evaluation $OT Eval Moderate Complexity: 1 Mod OT Treatments $Self Care/Home Management : 23-37 mins  Lynwood Constant, OTR/L Acute Rehabilitation Services  Office [phone] 01/26/2024

## 2024-01-26 NOTE — Progress Notes (Signed)
" ° ° °  EXPEDITER LEVEL LOADING ASSESSMENT NOTE  Patient Name: Briana Foster  DOB:28-Nov-1945 Date of Admission: 01/25/2024  Date of Assessment:01/26/2024   -------------------------------------------------------------------------------------------------------------------   Brief clinical summary:  79 y.o. female who presented to the ED because of increasing nonproductive cough feeling weak and poor appetite over the last 2 days.    Is there Bed Availability at another [facility]? Yes  If yes, what facility: [facility]  Level of Care Needed:  Yes  MD Agree to transfer: N/A  Patient agrees to transfer: No    -------------------------------------------------------------------------------------------------------------------  [facility] RN Expediter, Azarius Lambson S Sabien Umland Please contact us  directly via secure chat (search for [facility]) or by calling us  at [phone] [facility]). "

## 2024-01-26 NOTE — H&P (Signed)
 " History and Physical    Briana Foster FMW:[id number] DOB: 27-May-1945 DOA: 01/25/2024  Patient coming from: Home.  Chief Complaint: Cough shortness of breath.  HPI: Briana Foster is a 78 y.o. 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  Patient states her granddaughter has been sick with similar symptoms.  Denies any nausea vomiting diarrhea.  Denies any chest pain.  ED Course: In the ER patient was hypoxic requiring 2 L oxygen.  Chest x-ray shows interstitial infiltrates.  Patient had a temperature of 99.5 F labs show creatinine of 0.8 WBC 6.3 hemoglobin 13.9.  Patient's influenza test came back positive for influenza A.  Patient started on Tamiflu  and admitted for acute respiratory failure with hypoxia secondary to influenza A infection.  Review of Systems: As per HPI, rest all negative.   Past Medical History:  Diagnosis Date   Anemia    Arthritis 07/20/2012   hands   Asthma    Back pain    Carotid arterial disease    CHF (congestive heart failure) (HCC)    Chronic kidney disease    CKD 3a   Class 1 obesity with serious comorbidity and body mass index (BMI) of 31.0 to 31.9 in adult 03/26/2018   Coronary artery disease    Depression    Dysrhythmia    S. Fib,   had ablation   Essential hypertension 03/26/2018   GERD (gastroesophageal reflux disease)    comes and goes - no meds currently   History of kidney stones    Hyperlipidemia    Joint pain    Persistent atrial fibrillation (HCC)    Presence of permanent cardiac pacemaker    Restless leg syndrome    Rheumatoid arthritis  (HCC)    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   s/p TAVR with a 23 mm Edwards S3U via the TF approach by Dr. Verlin & Dr. Dusty   Severe aortic stenosis    Type 2 diabetes mellitus without complication, without long-term current use of insulin  (HCC) 04/10/2018   Vitamin D  deficiency     Past Surgical History:  Procedure Laterality Date   AV NODE ABLATION N/A 07/14/2021   Procedure: AV NODE ABLATION;  Surgeon: Inocencio Soyla Lunger, MD;  Location: [facility] IN[facility];  Service: Cardiovascular;  Laterality: N/A;   BUNIONECTOMY  20 yrs ago   bil feet   BUNIONECTOMY  11/30/2011   Procedure: BUNIONECTOMY;  Surgeon: Lynwood SHAUNNA Bern, MD;  Location: [facility] ORS;  Service: Orthopedics;  Laterality: Bilateral;  RIGHT FOOT EXCISION OF BUNIONETTE AND PARTIAL   PROXIMAL PHALANGECTOMY OF 5TH TOE LEFT FOOT FUNK BUNIONECTOMY,EXCISION OF BUNIONETTE    CARDIOVERSION N/A 11/05/2019   Procedure: CARDIOVERSION;  Surgeon: Pietro Redell RAMAN, MD;  Location: [facility] ENDOSCOPY;  Service: Cardiovascular;  Laterality: N/A;   CARDIOVERSION N/A 12/05/2019   Procedure: CARDIOVERSION;  Surgeon: Loni Soyla LABOR, MD;  Location: [facility] ENDOSCOPY;  Service: Cardiovascular;  Laterality: N/A;   COLONOSCOPY WITH PROPOFOL  N/A 08/07/2012   Procedure: COLONOSCOPY WITH PROPOFOL ;  Surgeon: Lunger MARLA Louder, MD;  Location: [facility] ENDOSCOPY;  Service: Endoscopy;  Laterality: N/A;   LEFT HEART CATH AND CORONARY ANGIOGRAPHY N/A 12/11/2023  Procedure: LEFT HEART CATH AND CORONARY ANGIOGRAPHY;  Surgeon: Wendel Lurena POUR, MD;  Location: [facility];  Service: Cardiovascular;  Laterality: N/A;   MOUTH SURGERY     teeth extractions   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   Procedure: PACEMAKER IMPLANT;  Surgeon: Inocencio Soyla Lunger, MD;  Location: [facility];  Service: Cardiovascular;  Laterality: N/A;   RIGHT/LEFT HEART CATH AND CORONARY ANGIOGRAPHY N/A 06/17/2020   Procedure: RIGHT/LEFT HEART CATH AND CORONARY  ANGIOGRAPHY;  Surgeon: Claudene Victory ORN, MD;  Location: [facility];  Service: Cardiovascular;  Laterality: N/A;   TONSILLECTOMY     TOTAL KNEE ARTHROPLASTY Right 06/13/2023   Procedure: ARTHROPLASTY, KNEE, TOTAL;  Surgeon: Ernie Cough, MD;  Location: [facility] ORS;  Service: Orthopedics;  Laterality: Right;   TRANSCATHETER AORTIC VALVE REPLACEMENT, TRANSFEMORAL N/A 06/30/2020   Procedure: TRANSCATHETER AORTIC VALVE REPLACEMENT, TRANSFEMORAL;  Surgeon: Verlin Lonni BIRCH, MD;  Location: [facility];  Service: Open Heart Surgery;  Laterality: N/A;   TUBAL LIGATION     WRIST SURGERY       reports that she has never smoked. She has never used smokeless tobacco. She reports that she does not drink alcohol and does not use drugs.  Allergies[1]  Family History  Problem Relation Age of Onset   Heart disease Mother    Stroke Mother    Cancer Father        Prostate    Prior to Admission medications  Medication Sig Start Date End Date Taking? Authorizing Provider  acetaminophen  (TYLENOL ) 500 MG tablet Take 500-1,000 mg by mouth every 8 (eight) hours as needed (for pain or headaches).    [provider]  albuterol  (VENTOLIN  HFA) 108 (90 Base) MCG/ACT inhaler Inhale 2 puffs into the lungs every 4 (four) hours as needed for wheezing or shortness of breath. Patient not taking: Reported on 12/09/2023 05/30/22   Raenelle Donalda HERO, MD  amLODipine  (NORVASC ) 10 MG tablet Take 10 mg by mouth daily.    [provider]  apixaban  (ELIQUIS ) 5 MG TABS tablet Take 1 tablet (5 mg total) by mouth 2 (two) times daily. 06/23/22   Camnitz, Soyla Lunger, MD  budesonide -formoterol  (SYMBICORT ) 160-4.5 MCG/ACT inhaler Inhale 2 puffs into the lungs 2 (two) times daily. Patient not taking: Reported on 12/09/2023 05/30/22   Raenelle Donalda HERO, MD  cyclobenzaprine (FLEXERIL) 5 MG tablet Take 5 mg by mouth 3 (three) times daily as needed for muscle spasms. 11/13/23   [provider]   glimepiride  (AMARYL ) 2 MG tablet Take 2 mg by mouth daily with breakfast.    [provider]  JARDIANCE  10 MG TABS tablet Take 10 mg by mouth daily. 12/09/22   [provider]  meclizine  (ANTIVERT ) 25 MG tablet Take 25 mg by mouth 2 (two) times daily as needed for dizziness. 09/09/22   [provider]  memantine  (NAMENDA ) 5 MG tablet Take 5 mg by mouth 2 (two) times daily.    [provider]  metFORMIN  (GLUCOPHAGE ) 500 MG tablet Take 2 tablets (1,000 mg total) by mouth daily with breakfast. 12/12/23   Odell Celinda Balo, MD  Multiple Vitamins-Minerals (CENTRUM SILVER 50+WOMEN) TABS Take 1 tablet by mouth daily with breakfast.    [provider]  olmesartan  (BENICAR ) 20 MG tablet Take 20 mg by mouth daily. 03/29/22   [provider]  ondansetron  (ZOFRAN -ODT) 4 MG disintegrating tablet Take 1 tablet (4 mg total) by mouth every 4 (four) hours as needed for  nausea or vomiting. 08/01/23   Armenta Canning, MD  rOPINIRole  (REQUIP ) 4 MG tablet Take 4 mg by mouth at bedtime. Take 4 mg by mouth at 8 PM    [provider]  rosuvastatin  (CRESTOR ) 40 MG tablet Take 1 tablet (40 mg total) by mouth daily. 12/13/23   Odell Celinda Balo, MD    Physical Exam: Constitutional: Moderately built and nourished. Vitals:   01/25/24 2330 01/26/24 0003 01/26/24 0004 01/26/24 0010  BP:  (!) 196/81  (!) 157/66  Pulse:  64  (!) 59  Resp:   (!) 21 (!) 21  Temp: 98.5 F (36.9 C)     TempSrc: Oral     SpO2:  95%  98%   Eyes: Anicteric no pallor. ENMT: No discharge from the ears eyes nose or mouth. Neck: No neck rigidity no mass felt. Respiratory: Mild coarse crepitation no rhonchi. Cardiovascular: S1-S2 heard. Abdomen: Soft nontender bowel sound present. Musculoskeletal: No edema. Skin: No rash. Neurologic: Alert awake oriented to time place and person.  Moves all extremities. Psychiatric: Appears normal.  Normal affect.   Labs on Admission: I have  personally reviewed following labs and imaging studies  CBC: Recent Labs  Lab 01/25/24 2014  WBC 6.3  HGB 13.9  HCT 41.3  MCV 92.4  PLT 132*   Basic Metabolic Panel: Recent Labs  Lab 01/25/24 2014  NA 135  K 3.7  CL 99  CO2 23  GLUCOSE 116*  BUN 12  CREATININE 0.82  CALCIUM  9.6   GFR: CrCl cannot be calculated (Unknown ideal weight.). Liver Function Tests: No results for input(s): AST, ALT, ALKPHOS, BILITOT, PROT, ALBUMIN in the last 168 hours. No results for input(s): LIPASE, AMYLASE in the last 168 hours. No results for input(s): AMMONIA in the last 168 hours. Coagulation Profile: No results for input(s): INR, PROTIME in the last 168 hours. Cardiac Enzymes: No results for input(s): CKTOTAL, CKMB, CKMBINDEX, TROPONINI in the last 168 hours. BNP (last 3 results) No results for input(s): PROBNP in the last 8760 hours. HbA1C: No results for input(s): HGBA1C in the last 72 hours. CBG: No results for input(s): GLUCAP in the last 168 hours. Lipid Profile: No results for input(s): CHOL, HDL, LDLCALC, TRIG, CHOLHDL, LDLDIRECT in the last 72 hours. Thyroid  Function Tests: No results for input(s): TSH, T4TOTAL, FREET4, T3FREE, THYROIDAB in the last 72 hours. Anemia Panel: No results for input(s): VITAMINB12, FOLATE, FERRITIN, TIBC, IRON, RETICCTPCT in the last 72 hours. Urine analysis:    Component Value Date/Time   COLORURINE YELLOW 08/01/2023 2205   APPEARANCEUR CLEAR 08/01/2023 2205   APPEARANCEUR Clear 02/16/2022 1316   LABSPEC 1.025 08/01/2023 2205   PHURINE 5.0 08/01/2023 2205   GLUCOSEU 50 (A) 08/01/2023 2205   HGBUR LARGE (A) 08/01/2023 2205   BILIRUBINUR NEGATIVE 08/01/2023 2205   BILIRUBINUR Negative 02/16/2022 1316   KETONESUR 20 (A) 08/01/2023 2205   PROTEINUR 30 (A) 08/01/2023 2205   NITRITE NEGATIVE 08/01/2023 2205   LEUKOCYTESUR NEGATIVE 08/01/2023 2205   Sepsis  Labs: @LABRCNTIP (procalcitonin:4,lacticidven:4) ) Recent Results (from the past 240 hours)  Resp panel by RT-PCR (RSV, Flu A&B, Covid) Anterior Nasal Swab     Status: Abnormal   Collection Time: 01/25/24  7:33 PM   Specimen: Anterior Nasal Swab  Result Value Ref Range Status   SARS Coronavirus 2 by RT PCR NEGATIVE NEGATIVE Final   Influenza A by PCR POSITIVE (A) NEGATIVE Final   Influenza B by PCR NEGATIVE NEGATIVE Final    Comment: (NOTE)  The Xpert Xpress SARS-CoV-2/FLU/RSV plus assay is intended as an aid in the diagnosis of influenza from Nasopharyngeal swab specimens and should not be used as a sole basis for treatment. Nasal washings and aspirates are unacceptable for Xpert Xpress SARS-CoV-2/FLU/RSV testing.  Fact Sheet for Patients: bloggercourse.com  Fact Sheet for Healthcare Providers: seriousbroker.it  This test is not yet approved or cleared by the [place]  FDA and has been authorized for detection and/or diagnosis of SARS-CoV-2 by FDA under an Emergency Use Authorization (EUA). This EUA will remain in effect (meaning this test can be used) for the duration of the COVID-19 declaration under Section 564(b)(1) of the Act, 21 U.S.C. section 360bbb-3(b)(1), unless the authorization is terminated or revoked.     Resp Syncytial Virus by PCR NEGATIVE NEGATIVE Final    Comment: (NOTE) Fact Sheet for Patients: bloggercourse.com  Fact Sheet for Healthcare Providers: seriousbroker.it  This test is not yet approved or cleared by the [place]  FDA and has been authorized for detection and/or diagnosis of SARS-CoV-2 by FDA under an Emergency Use Authorization (EUA). This EUA will remain in effect (meaning this test can be used) for the duration of the COVID-19 declaration under Section 564(b)(1) of the Act, 21 U.S.C. section 360bbb-3(b)(1), unless the authorization is  terminated or revoked.  Performed at [facility], [address] [place], [place] [address]      Radiological Exams on Admission: DG Chest 2 View Result Date: 01/25/2024 CLINICAL DATA:  Shortness of breath.  Cough. EXAM: CHEST - 2 VIEW COMPARISON:  12/09/2023 FINDINGS: Left-sided pacemaker in place. Stable heart size and mediastinal contours. TAVR. Aortic atherosclerosis. Interstitial coarsening, more so in the lung bases. No pneumothorax or pleural effusion. No acute osseous findings. IMPRESSION: Interstitial coarsening, more so in the lung bases, may represent atypical infection. Electronically Signed   By: Andrea Gasman M.D.   On: 01/25/2024 20:02    EKG: Independently reviewed.  Paced rhythm.  Assessment/Plan Principal Problem:   Acute respiratory failure with hypoxia (HCC) Active Problems:   Persistent atrial fibrillation (HCC)   Uncontrolled type 2 diabetes mellitus with hyperglycemia, without long-term current use of insulin  (HCC)   Chronic diastolic CHF (congestive heart failure) (HCC)   S/P TAVR (transcatheter aortic valve replacement)   Restless leg syndrome   Essential hypertension   Rheumatoid arthritis (HCC)   Hyperlipidemia   Cardiac pacemaker in situ   Influenza A    Acute respiratory failure with hypoxia presently on 2 L oxygen secondary to influenza A infection presently on Tamiflu .  I am also adding antibiotics for community-acquired pneumonia given hypoxia.  Closely monitor respiratory status. Hypertension uncontrolled presently on amlodipine  and ARB.  Closely monitor for blood pressure trends. History of permanent A-fib/tachybradycardia syndrome status post pacemaker placement on Eliquis . History of severe aortic stenosis status post TAVR.  Echocardiogram done last month showed moderate AAS with mean gradient of 19 mmHg. Diabetes mellitus type 2 presently on sliding scale coverage.  Last hemoglobin A1c was 6.8.  At home patient takes glipizide and  metformin . Hyperlipidemia on statins. Restless leg syndrome on Requip . Cognitive impairment on Namenda .  Since patient has acute respiratory failure with hypoxia with influenza infection will need more than 2 midnight stay.  DVT prophylaxis: Eliquis . Code Status: Full code. Family Communication: Unable to reach family. Disposition Plan: Monitored bed. Consults called: None. Admission status: The patient.         [1]  Allergies Allergen Reactions   Bee Venom Shortness Of Breath and Rash  Claritin  [Loratadine ] Itching and Palpitations   Wasp Venom Anaphylaxis   Apricot Flavoring Agent (Non-Screening) Swelling and Other (See Comments)    Eyes swell shut   Atorvastatin Itching and Swelling    Eye swelling and headaches   Losartan Itching and Rash   "

## 2024-01-26 NOTE — Progress Notes (Signed)
" °   01/26/24 1351  TOC Assessment  TOC screening is complete Yes  Once discharged, how will the patient get to their discharge location? Family/Friend - Photographer  Expected Discharge Plan Home/Self Care  Final next level of care Home/Self Care  Barriers to Discharge Continued Medical Work up  Choice offered to / list presented to  NA  Post Acute Care Choice NA  Discharge Planning Services CM Consult  Do you feel safe going back to the place where you live? Y  Permission sought to share information with  Case Manager   Patient stated has no needs for medical equipment. Patient will return home with family. Daughter will transport. "

## 2024-01-26 NOTE — ED Provider Notes (Signed)
 " [place] EMERGENCY DEPARTMENT AT [facility] Provider Note   CSN: [id number] Arrival date & time: 01/25/24  1919     Patient presents with: Shortness of Breath   Briana Foster is a 78 y.o. female.  With a history of heart failure, aortic stenosis status post TAVR NSTEMI and diabetes who presents to the ED for shortness of breath.  Worsening shortness of breath on exertion over the last 48 hours.  Patient notes she lives with her granddaughter who recently has had flulike symptoms.  Also having some nausea and 1 episode of vomiting today.  Denies fevers chills chest pain abdominal pain and changes in bowel habits.  No urinary symptoms.    Shortness of Breath      Prior to Admission medications  Medication Sig Start Date End Date Taking? Authorizing Provider  acetaminophen  (TYLENOL ) 500 MG tablet Take 500-1,000 mg by mouth every 8 (eight) hours as needed (for pain or headaches).    [provider]  albuterol  (VENTOLIN  HFA) 108 (90 Base) MCG/ACT inhaler Inhale 2 puffs into the lungs every 4 (four) hours as needed for wheezing or shortness of breath. Patient not taking: Reported on 12/09/2023 05/30/22   Raenelle Donalda HERO, MD  amLODipine  (NORVASC ) 10 MG tablet Take 10 mg by mouth daily.    [provider]  apixaban  (ELIQUIS ) 5 MG TABS tablet Take 1 tablet (5 mg total) by mouth 2 (two) times daily. 06/23/22   Camnitz, Soyla Lunger, MD  budesonide -formoterol  (SYMBICORT ) 160-4.5 MCG/ACT inhaler Inhale 2 puffs into the lungs 2 (two) times daily. Patient not taking: Reported on 12/09/2023 05/30/22   Raenelle Donalda HERO, MD  cyclobenzaprine (FLEXERIL) 5 MG tablet Take 5 mg by mouth 3 (three) times daily as needed for muscle spasms. 11/13/23   [provider]  glimepiride  (AMARYL ) 2 MG tablet Take 2 mg by mouth daily with breakfast.    [provider]  JARDIANCE  10 MG TABS tablet Take 10 mg by mouth daily. 12/09/22   [provider]  meclizine   (ANTIVERT ) 25 MG tablet Take 25 mg by mouth 2 (two) times daily as needed for dizziness. 09/09/22   [provider]  memantine  (NAMENDA ) 5 MG tablet Take 5 mg by mouth 2 (two) times daily.    [provider]  metFORMIN  (GLUCOPHAGE ) 500 MG tablet Take 2 tablets (1,000 mg total) by mouth daily with breakfast. 12/12/23   Odell Celinda Balo, MD  Multiple Vitamins-Minerals (CENTRUM SILVER 50+WOMEN) TABS Take 1 tablet by mouth daily with breakfast.    [provider]  olmesartan  (BENICAR ) 20 MG tablet Take 20 mg by mouth daily. 03/29/22   [provider]  ondansetron  (ZOFRAN -ODT) 4 MG disintegrating tablet Take 1 tablet (4 mg total) by mouth every 4 (four) hours as needed for nausea or vomiting. 08/01/23   Armenta Canning, MD  rOPINIRole  (REQUIP ) 4 MG tablet Take 4 mg by mouth at bedtime. Take 4 mg by mouth at 8 PM    [provider]  rosuvastatin  (CRESTOR ) 40 MG tablet Take 1 tablet (40 mg total) by mouth daily. 12/13/23   Odell Celinda Balo, MD    Allergies: Bee venom, Claritin  [loratadine ], Wasp venom, Apricot flavoring agent (non-screening), Atorvastatin, and Losartan    Review of Systems  Respiratory:  Positive for shortness of breath.     Updated Vital Signs BP (!) 157/66   Pulse (!) 59   Temp 98.5 F (36.9 C) (Oral)   Resp (!) 21  SpO2 98%   Physical Exam Vitals and nursing note reviewed.  HENT:     Head: Normocephalic and atraumatic.  Eyes:     Pupils: Pupils are equal, round, and reactive to light.  Cardiovascular:     Rate and Rhythm: Normal rate and regular rhythm.  Pulmonary:     Effort: Pulmonary effort is normal.     Breath sounds: Examination of the right-upper field reveals wheezing. Examination of the left-upper field reveals wheezing. Wheezing present.     Comments: Dry cough on exam Abdominal:     Palpations: Abdomen is soft.     Tenderness: There is no abdominal tenderness.  Skin:    General: Skin is warm and dry.   Neurological:     Mental Status: She is alert.  Psychiatric:        Mood and Affect: Mood normal.     (all labs ordered are listed, but only abnormal results are displayed) Labs Reviewed  RESP PANEL BY RT-PCR (RSV, FLU A&B, COVID)  RVPGX2 - Abnormal; Notable for the following components:      Result Value   Influenza A by PCR POSITIVE (*)    All other components within normal limits  BASIC METABOLIC PANEL WITH GFR - Abnormal; Notable for the following components:   Glucose, Bld 116 (*)    All other components within normal limits  CBC - Abnormal; Notable for the following components:   Platelets 132 (*)    All other components within normal limits    EKG: EKG Interpretation Date/Time:  Thursday January 25 2024 20:05:15 EST Ventricular Rate:  60 PR Interval:    QRS Duration:  170 QT Interval:  510 QTC Calculation: 510 R Axis:   -64  Text Interpretation: Ventricular-paced rhythm Abnormal ECG When compared with ECG of 10-Dec-2023 05:40, PREVIOUS ECG IS PRESENT Artifact Confirmed by Dreama Longs ([id number]) on 01/25/2024 8:09:51 PM  Radiology: [facility] Chest 2 View Result Date: 01/25/2024 CLINICAL DATA:  Shortness of breath.  Cough. EXAM: CHEST - 2 VIEW COMPARISON:  12/09/2023 FINDINGS: Left-sided pacemaker in place. Stable heart size and mediastinal contours. TAVR. Aortic atherosclerosis. Interstitial coarsening, more so in the lung bases. No pneumothorax or pleural effusion. No acute osseous findings. IMPRESSION: Interstitial coarsening, more so in the lung bases, may represent atypical infection. Electronically Signed   By: Andrea Gasman M.D.   On: 01/25/2024 20:02     Procedures   Medications Ordered in the ED  ipratropium-albuterol  (DUONEB) 0.5-2.5 (3) MG/3ML nebulizer solution 3 mL (has no administration in time range)  sodium chloride  0.9 % bolus 500 mL (0 mLs Intravenous Stopped 01/26/24 0005)  ondansetron  (ZOFRAN ) injection 4 mg (4 mg Intravenous Given 01/25/24 2211)   acetaminophen  (TYLENOL ) tablet 975 mg (975 mg Oral Given 01/25/24 2333)  ondansetron  (ZOFRAN ) injection 4 mg (4 mg Intravenous Given 01/25/24 2351)                                    Medical Decision Making 78 year old female with history as above presented to ED for dry cough shortness of breath made worse with ambulation.  Multiple sick contacts.  Influenza A positive.  Chest x-ray was clear.  Feels better after some hydration but is exhibiting signs of hypoxia and low 90s at rest and after talking.  Do not think she would do well at home as she has no supplemental oxygen there.  Will leave her on 2  L nasal cannula continue symptomatic management for flu symptoms with plan for inpatient admission  Amount and/or Complexity of Data Reviewed Labs: ordered. Radiology: ordered.  Risk OTC drugs. Prescription drug management.        Final diagnoses:  Hypoxia  Influenza A    ED Discharge Orders     None          Pamella Ozell LABOR, DO 01/26/24 0014  "

## 2024-01-26 NOTE — Care Management Obs Status (Signed)
 MEDICARE OBSERVATION STATUS NOTIFICATION   Patient Details  Name: Briana Foster MRN: [id number] Date of Birth: 05-Jan-1946   Medicare Observation Status Notification Given:  Yes    [name], RN 01/26/2024, 1:51 PM

## 2024-01-27 ENCOUNTER — Other Ambulatory Visit (HOSPITAL_COMMUNITY): Payer: Self-pay

## 2024-01-27 LAB — GLUCOSE, CAPILLARY
Glucose-Capillary: 165 mg/dL — ABNORMAL HIGH (ref 70–99)
Glucose-Capillary: 115 mg/dL — ABNORMAL HIGH (ref 70–99)
Glucose-Capillary: 148 mg/dL — ABNORMAL HIGH (ref 70–99)
Glucose-Capillary: 179 mg/dL — ABNORMAL HIGH (ref 70–99)

## 2024-01-27 MED ORDER — OSELTAMIVIR PHOSPHATE 75 MG PO CAPS
75.0000 mg | ORAL_CAPSULE | Freq: Two times a day (BID) | ORAL | 0 refills | Status: DC
  Filled 2024-01-27: qty 8, 4d supply, fill #0

## 2024-01-27 MED ORDER — AMOXICILLIN-POT CLAVULANATE 875-125 MG PO TABS
1.0000 | ORAL_TABLET | Freq: Two times a day (BID) | ORAL | 0 refills | Status: DC
  Filled 2024-01-27: qty 8, 4d supply, fill #0

## 2024-01-27 MED ORDER — OSELTAMIVIR PHOSPHATE 75 MG PO CAPS
75.0000 mg | ORAL_CAPSULE | Freq: Two times a day (BID) | ORAL | 0 refills | Status: DC
  Filled 2024-01-27: qty 16, 8d supply, fill #0

## 2024-01-27 NOTE — TOC Transition Note (Signed)
 Transition of Care [facility]) - Discharge Note   Patient Details  Name: Briana Foster MRN: [id number] Date of Birth: [birth year]  Transition of Care [facility]) CM/SW Contact:  Robynn Eileen Hoose, RN Phone Number: 01/27/2024, 11:20 AM   Clinical Narrative:   Patient is being discharged today. Home health orders noted, patient has used [facility] in the past (July 2025). Darleene with [facility] able to accept patient again for ordered home health services. Contact information placed on AVS.    Final next level of care: Home w Home Health Services Barriers to Discharge: No Barriers Identified   Patient Goals and CMS Choice     Choice offered to / list presented to : NA      Discharge Placement                       Discharge Plan and Services Additional resources added to the After Visit Summary for     Discharge Planning Services: CM Consult Post Acute Care Choice: NA                    HH Arranged: PT, OT HH Agency: [facility] Date [facility] Agency Contacted: 01/27/24 Time HH Agency Contacted: 1041 Representative spoke with at [facility] Agency: Darleene  Social Drivers of Health (SDOH) Interventions SDOH Screenings   Food Insecurity: No Food Insecurity (01/26/2024)  Housing: Low Risk (01/26/2024)  Transportation Needs: No Transportation Needs (01/26/2024)  Utilities: Not At Risk (01/26/2024)  Social Connections: Moderately Integrated (01/26/2024)  Tobacco Use: Low Risk (01/26/2024)     Readmission Risk Interventions    06/21/2023   10:35 AM 11/05/2021    3:55 PM  Readmission Risk Prevention Plan  Post Dischage Appt Complete Complete  Medication Screening Complete Complete  Transportation Screening Complete Complete

## 2024-01-27 NOTE — Progress Notes (Signed)
 Discharge orders received. Patient is now stating to RN that she does not feel comfortable being discharged today. RN sent message to Dr. Jens to convey patient's concerns.

## 2024-01-27 NOTE — Progress Notes (Signed)
 Mobility Specialist Progress Note:    01/27/24 1142  Mobility  Activity Ambulated with assistance (In room)  Level of Assistance Contact guard assist, steadying assist  Assistive Device Other (Comment) (IV Pole/ HHA)  Distance Ambulated (ft) 30 ft  Activity Response Tolerated well  Mobility Referral Yes  Mobility visit 1 Mobility  Mobility Specialist Start Time (ACUTE ONLY) 1115  Mobility Specialist Stop Time (ACUTE ONLY) 1142  Mobility Specialist Time Calculation (min) (ACUTE ONLY) 27 min   Received pt in bed and agreeable to mobility. Pt required MinG. No c/o. Pt's SPO2 stayed above 92% during amb. Left pt in chair with alarm on. Personal belongings and call light within reach. All needs met.  Lavanda Pollack Mobility Specialist  Please contact via [employer] or  Rehab Office [phone]

## 2024-01-27 NOTE — Progress Notes (Addendum)
 "    Progress Note    Briana Foster  FMW:[id number] DOB: 11-11-1945  DOA: 01/25/2024 PCP: Ransom Other, MD      Brief Narrative:    Medical records reviewed and are as summarized below:  Briana Foster is a 78 y.o. 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     She presented to the hospital because of not productive cough, general weakness, poor appetite for about 2 days duration.  She said her granddaughter had been sick with similar symptoms.   Vital signs in the ED: Temperature 99.2 F, respiratory 20, pulse 63, BP 155/63, oxygen saturation 93% on room air. Subsequently placed on 2 L of oxygen for concern for hypoxia due to did not see any documented hypoxia in the chart.   She tested positive for influenza A.   Chest x-ray:   IMPRESSION: Interstitial coarsening, more so in the lung bases, may represent atypical infection.       She was admitted to the hospital for influenza A infection and possible secondary bacterial pneumonia.  She was started on Tamiflu  and empiric IV ceftriaxone  and azithromycin .      Assessment/Plan:   Principal Problem:   Acute respiratory failure with hypoxia (HCC) Active Problems:   Persistent atrial fibrillation (HCC)   Uncontrolled type 2 diabetes mellitus with hyperglycemia, without long-term current use of insulin  (HCC)   Chronic diastolic CHF (congestive heart failure) (HCC)   S/P TAVR (transcatheter aortic valve replacement)   Restless leg syndrome   Essential hypertension   Rheumatoid arthritis (HCC)   Hyperlipidemia   Cardiac pacemaker in situ   Influenza A     Body mass index is 27.93 kg/m.   Influenza A infection, possible secondary  bacterial infection: She has been weaned off of oxygen.  Continue Tamiflu , ceftriaxone  and azithromycin . Patient initially refused to take Tamiflu  this morning.  She said Tamiflu  does not do her any good.  I explained that using Tamiflu  reduces the risk of complications from influenza A even though respiratory symptoms may persist.  She has agreed to take Tamiflu .   Hypertension: Continue antihypertensive   Chronic HFpEF: Compensated.   History of severe aortic stenosis s/p TAVR   Comorbidities include type II DM, hyperlipidemia, restless leg syndrome, cognitive impairment   Discussed discharge plan with the patient and Destiny, granddaughter (over the phone).  They were both agreeable to the discharge plan.  However, I was informed later by the nurse that Destiny had called and said she did not feel comfortable but patient be discharged home today.  Patient also stated same.  Discharge has been canceled.   Plan discussed with Corean, RN, at the bedside    Diet Order             Diet heart healthy/carb modified Room service appropriate? Yes; Fluid consistency: Thin  Diet effective now                                  Consultants: None  Procedures: None    Medications:    amLODipine   10 mg Oral Daily   apixaban   5 mg Oral BID   insulin  aspart  0-9 Units Subcutaneous TID WC   irbesartan   150 mg Oral Daily   memantine   5 mg Oral BID   oseltamivir   75 mg Oral BID   polyethylene glycol  17 g Oral Daily   rOPINIRole   4 mg Oral QHS   rosuvastatin   40 mg Oral Daily   Continuous Infusions:  cefTRIAXone  (ROCEPHIN )  IV Stopped (01/27/24 0932)   doxycycline  (VIBRAMYCIN ) IV 125 mL/hr at 01/27/24 1100     Anti-infectives (From admission, onward)    Start     Dose/Rate Route Frequency Ordered Stop   01/27/24 0345  cefTRIAXone  (ROCEPHIN ) 2 g in sodium chloride  0.9 % 100 mL IVPB        2 g 200 mL/hr over 30 Minutes Intravenous Every 24 hours 01/26/24  1007     01/27/24 0000  oseltamivir  (TAMIFLU ) 75 MG capsule  Status:  Discontinued        75 mg Oral 2 times daily 01/27/24 1018 01/27/24    01/27/24 0000  amoxicillin -clavulanate (AUGMENTIN ) 875-125 MG tablet        1 tablet Oral 2 times daily 01/27/24 1018 01/31/24 2359   01/27/24 0000  oseltamivir  (TAMIFLU ) 75 MG capsule        75 mg Oral 2 times daily 01/27/24 1054 01/31/24 2359   01/26/24 0900  oseltamivir  (TAMIFLU ) capsule 75 mg        75 mg Oral 2 times daily 01/26/24 0106 01/30/24 2159   01/26/24 0400  doxycycline  (VIBRAMYCIN ) 100 mg in sodium chloride  0.9 % 250 mL IVPB        100 mg 125 mL/hr over 120 Minutes Intravenous Every 12 hours 01/26/24 0333     01/26/24 0345  cefTRIAXone  (ROCEPHIN ) 1 g in sodium chloride  0.9 % 100 mL IVPB  Status:  Discontinued        1 g 200 mL/hr over 30 Minutes Intravenous Every 24 hours 01/26/24 0333 01/26/24 1007   01/26/24 0030  oseltamivir  (TAMIFLU ) capsule 75 mg        75 mg Oral  Once 01/26/24 0022 01/26/24 0207              Family Communication/Anticipated D/C date and plan/Code Status   DVT prophylaxis:  apixaban  (ELIQUIS ) tablet 5 mg     Code Status: Full Code  Family Communication: Plan discussed with destiny, her granddaughter, over the phone Disposition Plan: Plan to discharge home   Status is: Inpatient Remains inpatient appropriate because: Patient was initially discharged.  However, she later said she did not feel ready going home today.       Subjective:   Interval events noted.  No complaints.  She feels better today.  Cough has improved.  No shortness of breath or chest pain.  No vomiting no abdominal pain.  She had a bowel movement the day before yesterday.  Objective:    Vitals:   01/27/24 0401 01/27/24 0727 01/27/24 1014 01/27/24 1216  BP: (!) 159/77 (!) 139/57  132/69  Pulse: 60 62  73  Resp: 18 17  18   Temp: 98.4 F (36.9 C) 98.6 F (37 C)  98.4 F (36.9 C)  TempSrc: Oral Oral  Oral  SpO2: 97%  95% 96% 92%  Weight:      Height:       No data found.   Intake/Output Summary (Last 24 hours) at 01/27/2024 1349 Last data filed at 01/27/2024 1100 Gross per 24 hour  Intake 630.65 ml  Output 200 ml  Net 430.65 ml   Filed Weights   01/26/24 1000 01/26/24  1623  Weight: 83 kg 80.9 kg    Exam:  GEN: NAD SKIN: Warm and dry EYES: No pallor or icterus ENT: MMM CV: RRR PULM: CTA B ABD: soft, ND, NT, +BS CNS: AAO x 3, non focal EXT: No edema or tenderness        Data Reviewed:   I have personally reviewed following labs and imaging studies:  Labs: Labs show the following:   Basic Metabolic Panel: Recent Labs  Lab 01/25/24 2014 01/26/24 0454  NA 135 137  K 3.7 3.5  CL 99 101  CO2 23 23  GLUCOSE 116* 152*  BUN 12 14  CREATININE 0.82 0.80  CALCIUM  9.6 9.1   GFR Estimated Creatinine Clearance: 65.5 mL/min (by C-G formula based on SCr of 0.8 mg/dL). Liver Function Tests: Recent Labs  Lab 01/26/24 0454  AST 24  ALT 19  ALKPHOS 65  BILITOT 1.0  PROT 6.4*  ALBUMIN 3.9   No results for input(s): LIPASE, AMYLASE in the last 168 hours. No results for input(s): AMMONIA in the last 168 hours. Coagulation profile No results for input(s): INR, PROTIME in the last 168 hours.  CBC: Recent Labs  Lab 01/25/24 2014 01/26/24 0454  WBC 6.3 7.0  HGB 13.9 12.9  HCT 41.3 38.0  MCV 92.4 91.3  PLT 132* 120*   Cardiac Enzymes: No results for input(s): CKTOTAL, CKMB, CKMBINDEX, TROPONINI in the last 168 hours. BNP (last 3 results) No results for input(s): PROBNP in the last 8760 hours. CBG: Recent Labs  Lab 01/26/24 1350 01/26/24 1634 01/26/24 2104 01/27/24 0717 01/27/24 1212  GLUCAP 129* 107* 190* 165* 115*   D-Dimer: No results for input(s): DDIMER in the last 72 hours. Hgb A1c: No results for input(s): HGBA1C in the last 72 hours. Lipid Profile: No results for input(s): CHOL, HDL, LDLCALC, TRIG, CHOLHDL,  LDLDIRECT in the last 72 hours. Thyroid  function studies: No results for input(s): TSH, T4TOTAL, T3FREE, THYROIDAB in the last 72 hours.  Invalid input(s): FREET3 Anemia work up: No results for input(s): VITAMINB12, FOLATE, FERRITIN, TIBC, IRON, RETICCTPCT in the last 72 hours. Sepsis Labs: Recent Labs  Lab 01/25/24 2014 01/26/24 0454  WBC 6.3 7.0    Microbiology Recent Results (from the past 240 hours)  Resp panel by RT-PCR (RSV, Flu A&B, Covid) Anterior Nasal Swab     Status: Abnormal   Collection Time: 01/25/24  7:33 PM   Specimen: Anterior Nasal Swab  Result Value Ref Range Status   SARS Coronavirus 2 by RT PCR NEGATIVE NEGATIVE Final   Influenza A by PCR POSITIVE (A) NEGATIVE Final   Influenza B by PCR NEGATIVE NEGATIVE Final    Comment: (NOTE) The Xpert Xpress SARS-CoV-2/FLU/RSV plus assay is intended as an aid in the diagnosis of influenza from Nasopharyngeal swab specimens and should not be used as a sole basis for treatment. Nasal washings and aspirates are unacceptable for Xpert Xpress SARS-CoV-2/FLU/RSV testing.  Fact Sheet for Patients: bloggercourse.com  Fact Sheet for Healthcare Providers: seriousbroker.it  This test is not yet approved or cleared by the [place]  FDA and has been authorized for detection and/or diagnosis of SARS-CoV-2 by FDA under an Emergency Use Authorization (EUA). This EUA will remain in effect (meaning this test can be used) for the duration of the COVID-19 declaration under Section 564(b)(1) of the Act, 21 U.S.C. section 360bbb-3(b)(1), unless the authorization is terminated or revoked.     Resp Syncytial Virus by PCR NEGATIVE NEGATIVE Final    Comment: (NOTE) Fact  Sheet for Patients: bloggercourse.com  Fact Sheet for Healthcare Providers: seriousbroker.it  This test is not yet approved or cleared by  the [place]  FDA and has been authorized for detection and/or diagnosis of SARS-CoV-2 by FDA under an Emergency Use Authorization (EUA). This EUA will remain in effect (meaning this test can be used) for the duration of the COVID-19 declaration under Section 564(b)(1) of the Act, 21 U.S.C. section 360bbb-3(b)(1), unless the authorization is terminated or revoked.  Performed at [facility], [address], [place], [place] [address]     Procedures and diagnostic studies:  DG Abd 1 View Result Date: 01/26/2024 EXAM: 1 VIEW XRAY OF THE ABDOMEN 01/26/2024 07:49:00 AM COMPARISON: CT abdomen and pelvis 08/01/2023 and earlier. CLINICAL HISTORY: 78 year old female. Nausea and vomiting. FINDINGS: BOWEL: Moderate gaseous distension of the stomach. No dilated small bowel. Large bowel loops nondilated, containing gas to the rectum. SOFT TISSUES: No abnormal calcifications. BONES: Degenerative changes of the lumbar spine. No acute fracture. IMPRESSION: 1. Moderate gastric distention with gas; consider NG tube decompression. 2. Otherwise Nonobstructive bowel gas pattern. Electronically signed by: Helayne Hurst MD 01/26/2024 07:58 AM EST RP Workstation: [id number]   DG Chest 2 View Result Date: 01/25/2024 CLINICAL DATA:  Shortness of breath.  Cough. EXAM: CHEST - 2 VIEW COMPARISON:  12/09/2023 FINDINGS: Left-sided pacemaker in place. Stable heart size and mediastinal contours. TAVR. Aortic atherosclerosis. Interstitial coarsening, more so in the lung bases. No pneumothorax or pleural effusion. No acute osseous findings. IMPRESSION: Interstitial coarsening, more so in the lung bases, may represent atypical infection. Electronically Signed   By: Andrea Gasman M.D.   On: 01/25/2024 20:02               LOS: 1 day   Damarious Holtsclaw  [facility]   Pager on [email or web address]. If 7PM-7AM, please contact night-coverage at [email or web address]     01/27/2024, 1:49 PM           "

## 2024-01-27 NOTE — Progress Notes (Signed)
 AVS completed for discharge packet.

## 2024-01-27 NOTE — Discharge Summary (Incomplete)
 " Physician Discharge Summary   Patient: Briana Foster MRN: [id number] DOB: 1945/07/23  Admit date:     01/25/2024  Discharge date: {dischdate:26783}  Discharge Physician: AIDA CHO   PCP: Ransom Other, MD   Recommendations at discharge:  {Tip this will not be part of the note when signed- Example include specific recommendations for outpatient follow-up, pending tests to follow-up on. (Optional):26781}  ***  Discharge Diagnoses: Principal Problem:   Acute respiratory failure with hypoxia (HCC) Active Problems:   Persistent atrial fibrillation (HCC)   Uncontrolled type 2 diabetes mellitus with hyperglycemia, without long-term current use of insulin  (HCC)   Chronic diastolic CHF (congestive heart failure) (HCC)   S/P TAVR (transcatheter aortic valve replacement)   Restless leg syndrome   Essential hypertension   Rheumatoid arthritis (HCC)   Hyperlipidemia   Cardiac pacemaker in situ   Influenza A  Resolved Problems:   * No resolved hospital problems. [facility] Course: No notes on file  Assessment and Plan: No notes have been filed under this hospital service. Service: Hospitalist   dESTINY, GRANDDAUGHTER****    {Tip this will not be part of the note when signed Body mass index is 27.93 kg/m. , ,  (Optional):26781}  {(NOTE) Pain control PDMP Statment (Optional):26782} Consultants: *** Procedures performed: ***  Disposition: {Plan; Disposition:26390} Diet recommendation:  {Diet_Plan:26776} DISCHARGE MEDICATION: Allergies as of 01/27/2024       Reactions   Bee Venom Shortness Of Breath, Rash   Claritin  [loratadine ] Itching, Palpitations   Wasp Venom Anaphylaxis   Apricot Flavoring Agent (non-screening) Swelling, Other (See Comments)   Eyes swell shut   Atorvastatin Itching, Swelling   Eye swelling and headaches   Losartan Itching, Rash        Medication List     TAKE these medications    rOPINIRole  4 MG tablet Commonly known as:  REQUIP  Take 4 mg by mouth at bedtime. Take 4 mg by mouth at 8 PM The timing of this medication is very important.   acetaminophen  500 MG tablet Commonly known as: TYLENOL  Take 500-1,000 mg by mouth every 8 (eight) hours as needed (for pain or headaches).   amLODipine  10 MG tablet Commonly known as: NORVASC  Take 10 mg by mouth daily.   amoxicillin -clavulanate 875-125 MG tablet Commonly known as: AUGMENTIN  Take 1 tablet by mouth 2 (two) times daily for 4 days.   apixaban  5 MG Tabs tablet Commonly known as: Eliquis  Take 1 tablet (5 mg total) by mouth 2 (two) times daily.   Centrum Silver 50+Women Tabs Take 1 tablet by mouth daily with breakfast.   empagliflozin  10 MG Tabs tablet Commonly known as: JARDIANCE  Take 10 mg by mouth in the morning.   glimepiride  2 MG tablet Commonly known as: AMARYL  Take 2 mg by mouth daily with breakfast.   ketoconazole 2 % cream Commonly known as: NIZORAL Apply 1 Application topically daily as needed for irritation.   meclizine  25 MG tablet Commonly known as: ANTIVERT  Take 25 mg by mouth 2 (two) times daily as needed for dizziness.   memantine  5 MG tablet Commonly known as: NAMENDA  Take 5 mg by mouth 2 (two) times daily.   metFORMIN  500 MG tablet Commonly known as: GLUCOPHAGE  Take 2 tablets (1,000 mg total) by mouth daily with breakfast.   olmesartan  40 MG tablet Commonly known as: BENICAR  Take 40 mg by mouth daily.   ondansetron  4 MG disintegrating tablet Commonly known as: ZOFRAN -ODT Take 1 tablet (4 mg total) by mouth every  4 (four) hours as needed for nausea or vomiting.   oseltamivir  75 MG capsule Commonly known as: TAMIFLU  Take 1 capsule (75 mg total) by mouth 2 (two) times daily for 8 days.   rosuvastatin  40 MG tablet Commonly known as: CRESTOR  Take 1 tablet (40 mg total) by mouth daily.        Discharge Exam: Filed Weights   01/26/24 1000 01/26/24 1623  Weight: 83 kg 80.9 kg   ***  Condition at discharge: {DC  Condition:26389}  The results of significant diagnostics from this hospitalization (including imaging, microbiology, ancillary and laboratory) are listed below for reference.   Imaging Studies: DG Abd 1 View Result Date: 01/26/2024 EXAM: 1 VIEW XRAY OF THE ABDOMEN 01/26/2024 07:49:00 AM COMPARISON: CT abdomen and pelvis 08/01/2023 and earlier. CLINICAL HISTORY: 78 year old female. Nausea and vomiting. FINDINGS: BOWEL: Moderate gaseous distension of the stomach. No dilated small bowel. Large bowel loops nondilated, containing gas to the rectum. SOFT TISSUES: No abnormal calcifications. BONES: Degenerative changes of the lumbar spine. No acute fracture. IMPRESSION: 1. Moderate gastric distention with gas; consider NG tube decompression. 2. Otherwise Nonobstructive bowel gas pattern. Electronically signed by: Helayne Hurst MD 01/26/2024 07:58 AM EST RP Workstation: [id number]   DG Chest 2 View Result Date: 01/25/2024 CLINICAL DATA:  Shortness of breath.  Cough. EXAM: CHEST - 2 VIEW COMPARISON:  12/09/2023 FINDINGS: Left-sided pacemaker in place. Stable heart size and mediastinal contours. TAVR. Aortic atherosclerosis. Interstitial coarsening, more so in the lung bases. No pneumothorax or pleural effusion. No acute osseous findings. IMPRESSION: Interstitial coarsening, more so in the lung bases, may represent atypical infection. Electronically Signed   By: Andrea Gasman M.D.   On: 01/25/2024 20:02    Microbiology: Results for orders placed or performed during the hospital encounter of 01/25/24  Resp panel by RT-PCR (RSV, Flu A&B, Covid) Anterior Nasal Swab     Status: Abnormal   Collection Time: 01/25/24  7:33 PM   Specimen: Anterior Nasal Swab  Result Value Ref Range Status   SARS Coronavirus 2 by RT PCR NEGATIVE NEGATIVE Final   Influenza A by PCR POSITIVE (A) NEGATIVE Final   Influenza B by PCR NEGATIVE NEGATIVE Final    Comment: (NOTE) The Xpert Xpress SARS-CoV-2/FLU/RSV plus assay is  intended as an aid in the diagnosis of influenza from Nasopharyngeal swab specimens and should not be used as a sole basis for treatment. Nasal washings and aspirates are unacceptable for Xpert Xpress SARS-CoV-2/FLU/RSV testing.  Fact Sheet for Patients: bloggercourse.com  Fact Sheet for Healthcare Providers: seriousbroker.it  This test is not yet approved or cleared by the [place]  FDA and has been authorized for detection and/or diagnosis of SARS-CoV-2 by FDA under an Emergency Use Authorization (EUA). This EUA will remain in effect (meaning this test can be used) for the duration of the COVID-19 declaration under Section 564(b)(1) of the Act, 21 U.S.C. section 360bbb-3(b)(1), unless the authorization is terminated or revoked.     Resp Syncytial Virus by PCR NEGATIVE NEGATIVE Final    Comment: (NOTE) Fact Sheet for Patients: bloggercourse.com  Fact Sheet for Healthcare Providers: seriousbroker.it  This test is not yet approved or cleared by the [place]  FDA and has been authorized for detection and/or diagnosis of SARS-CoV-2 by FDA under an Emergency Use Authorization (EUA). This EUA will remain in effect (meaning this test can be used) for the duration of the COVID-19 declaration under Section 564(b)(1) of the Act, 21 U.S.C. section 360bbb-3(b)(1), unless the authorization is terminated or  revoked.  Performed at [facility], [address] [place], [place] [address]     Labs: CBC: Recent Labs  Lab 01/25/24 2014 01/26/24 0454  WBC 6.3 7.0  HGB 13.9 12.9  HCT 41.3 38.0  MCV 92.4 91.3  PLT 132* 120*   Basic Metabolic Panel: Recent Labs  Lab 01/25/24 2014 01/26/24 0454  NA 135 137  K 3.7 3.5  CL 99 101  CO2 23 23  GLUCOSE 116* 152*  BUN 12 14  CREATININE 0.82 0.80  CALCIUM  9.6 9.1   Liver Function Tests: Recent Labs  Lab  01/26/24 0454  AST 24  ALT 19  ALKPHOS 65  BILITOT 1.0  PROT 6.4*  ALBUMIN 3.9   CBG: Recent Labs  Lab 01/26/24 0848 01/26/24 1350 01/26/24 1634 01/26/24 2104 01/27/24 0717  GLUCAP 153* 129* 107* 190* 165*    Discharge time spent: {LESS THAN/GREATER THAN:26388} 30 minutes.  Signed: AIDA CHO, MD [facility] 01/27/2024 "

## 2024-01-28 ENCOUNTER — Other Ambulatory Visit (HOSPITAL_COMMUNITY): Payer: Self-pay

## 2024-01-28 LAB — GLUCOSE, CAPILLARY
Glucose-Capillary: 97 mg/dL (ref 70–99)
Glucose-Capillary: 183 mg/dL — ABNORMAL HIGH (ref 70–99)

## 2024-01-28 MED ORDER — GUAIFENESIN-DM 100-10 MG/5ML PO SYRP: 10.0000 mL | ORAL_SOLUTION | Freq: Four times a day (QID) | ORAL | 0 refills | Status: AC | PRN

## 2024-01-28 MED ORDER — OSELTAMIVIR PHOSPHATE 75 MG PO CAPS: 75.0000 mg | ORAL_CAPSULE | Freq: Two times a day (BID) | ORAL | 0 refills | Status: AC

## 2024-01-28 MED ORDER — AMOXICILLIN-POT CLAVULANATE 875-125 MG PO TABS
1.0000 | ORAL_TABLET | Freq: Two times a day (BID) | ORAL | Status: DC
  Administered 2024-01-28: 1 via ORAL
  Filled 2024-01-28: qty 1

## 2024-01-28 MED ORDER — DOXYCYCLINE HYCLATE 100 MG PO TABS
100.0000 mg | ORAL_TABLET | Freq: Two times a day (BID) | ORAL | Status: DC
  Administered 2024-01-28: 100 mg via ORAL
  Filled 2024-01-28: qty 1

## 2024-01-28 MED ORDER — AMOXICILLIN-POT CLAVULANATE 875-125 MG PO TABS: 1.0000 | ORAL_TABLET | Freq: Two times a day (BID) | ORAL | 0 refills | Status: AC

## 2024-01-28 NOTE — Progress Notes (Signed)
 Mobility Specialist Progress Note:    01/28/24 1235  Mobility  Activity Ambulated with assistance (In hallway)  Level of Assistance Contact guard assist, steadying assist  Assistive Device Front wheel walker  Distance Ambulated (ft) 100 ft  Activity Response Tolerated well  Mobility Referral Yes  Mobility visit 1 Mobility  Mobility Specialist Start Time (ACUTE ONLY) 1212  Mobility Specialist Stop Time (ACUTE ONLY) 1235  Mobility Specialist Time Calculation (min) (ACUTE ONLY) 23 min   Received pt in bed and agreeable to mobility. Pt required MinG. No c/o. Pt's SPO2 stayed above 90% during amb. Left pt EOB. Personal belongings and call light within reach. All needs met.   Lavanda Pollack Mobility Specialist  Please contact via [employer] or  Rehab Office [phone]

## 2024-01-28 NOTE — TOC Transition Note (Signed)
 Transition of Care [facility]) - Discharge Note   Patient Details  Name: Briana Foster MRN: [id number] Date of Birth: January 28, 1946  [facility] of [facility]) CM/SW Contact:  Robynn Eileen Hoose, RN Phone Number: 01/28/2024, 3:18 PM   Clinical Narrative:   Patient is being discharged today. Cory with [facility] made aware.    Final next level of care: Home w Home Health Services Barriers to Discharge: No Barriers Identified   Patient Goals and CMS Choice     Choice offered to / list presented to : NA      Discharge Placement                       Discharge Plan and Services Additional resources added to the After Visit Summary for     Discharge Planning Services: CM Consult Post Acute Care Choice: NA                    HH Arranged: PT, OT HH Agency: [facility] Date [facility] Contacted: 01/27/24 Time HH Agency Contacted: 1041 Representative spoke with at [facility] Agency: Darleene  Social Drivers of Health (SDOH) Interventions SDOH Screenings   Food Insecurity: No Food Insecurity (01/26/2024)  Housing: Low Risk (01/26/2024)  Transportation Needs: No Transportation Needs (01/26/2024)  Utilities: Not At Risk (01/26/2024)  Social Connections: Moderately Integrated (01/26/2024)  Tobacco Use: Low Risk (01/26/2024)     Readmission Risk Interventions    06/21/2023   10:35 AM 11/05/2021    3:55 PM  Readmission Risk Prevention Plan  Post Dischage Appt Complete Complete  Medication Screening Complete Complete  Transportation Screening Complete Complete

## 2024-01-28 NOTE — Discharge Summary (Signed)
 [facility] Discharge Summary   Patient: Briana Foster FMW:[id number]  PCP: Ransom Other, MD  Date of admission: 01/25/2024   Date of discharge:  01/28/2024     Discharge Diagnoses:  Principal Problem:   Acute respiratory failure with hypoxia [facility]) Active Problems:   Persistent atrial fibrillation (HCC)   Uncontrolled type 2 diabetes mellitus with hyperglycemia, without long-term current use of insulin  (HCC)   Chronic diastolic CHF (congestive heart failure) (HCC)   S/P TAVR (transcatheter aortic valve replacement)   Restless leg syndrome   Essential hypertension   Rheumatoid arthritis (HCC)   Hyperlipidemia   Cardiac pacemaker in situ   Influenza A   Admitted From: Home Disposition: Home  Recommendations for Outpatient Follow-up:  PCP: In 1 week Follow up LABS/TEST:     Follow-up Information     [facility] Follow up.   Specialty: Home Health Services Why: Physical therapy. Occupational therapy. Nursing services. Office will call to arrange follow up after hosptial discharge. Contact information: [address] [place] [address] [phone]         Ransom Other, MD Follow up in 1 week(s).   Specialty: Internal Medicine Contact information: [address] [place] [address] [phone]                Diet recommendation: Cardiac and Carb modified diet  Activity: The patient is advised to gradually reintroduce usual activities, as tolerated  Discharge Condition: stable  Code Status: Full code   History of present illness: As per the H and P dictated on admission. Hospital Course:    Medical records reviewed and are as summarized below:   Briana Foster is a 78 y.o. 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     She presented to the hospital because of not productive cough, general weakness, poor appetite for about 2 days duration.  She said her granddaughter had been sick with similar symptoms.   Vital signs in the ED: Temperature 99.2 F, respiratory 20, pulse 63, BP 155/63, oxygen saturation 93% on room air. Subsequently placed on 2 L of oxygen for concern for hypoxia due to did not see any documented hypoxia in the chart.   She tested positive for influenza A.   Chest x-ray:   IMPRESSION: Interstitial coarsening, more so in the lung bases, may represent atypical infection.       She was admitted to the hospital for influenza A infection and possible secondary bacterial pneumonia.  She was started on Tamiflu  and empiric IV ceftriaxone  and azithromycin   Assessment/Plan:   # Influenza A infection:  possible secondary bacterial infection: She has been weaned off of oxygen.  S/p Tamiflu , ceftriaxone  and azithromycin . Patient initially refused to take Tamiflu  in the morning.  She said Tamiflu  does not do her any good.  Patient was explained that using Tamiflu  reduces the risk of complications from influenza A even though respiratory symptoms may persist. She agreed to take Tamiflu , advised to complete 5-day course.  Augmentin  p.o. twice daily given for 4 more days to complete 5-day course for possible bacterial infection.    # Hypertension: Continue antihypertensive # Chronic HFpEF: Compensated. # History of severe aortic stenosis s/p TAVR No active issues, patient was advised to monitor  BP at home, continue home medications.  Comorbidities include type II DM, hyperlipidemia, restless leg syndrome, cognitive impairment Resume home medications.    Body mass index is 27.93 kg/m.  Nutrition Interventions:  Patient was ambulatory without any assistance.  On the day of the discharge the patient's  vitals were stable, and no other acute medical condition were reported by patient. the patient was felt safe to be discharge at Home.  Consultants: None Procedures: None  Discharge Exam: General: Appear in no distress, Oral Mucosa Clear, moist. Cardiovascular: S1 and S2 Present, no Murmur, Respiratory: normal respiratory effort, Bilateral Air entry present and no Crackles, no wheezes Abdomen: Bowel Sound present, Soft and no tenderness. Extremities: no Pedal edema, no calf tenderness Neurology: alert and oriented to time, place, and person affect appropriate.  Filed Weights   01/26/24 1000 01/26/24 1623  Weight: 83 kg 80.9 kg   Vitals:   01/28/24 0806 01/28/24 1212  BP: (!) 142/79 (!) 144/62  Pulse:    Resp: 16 18  Temp: 98.2 F (36.8 C) 98.3 F (36.8 C)  SpO2:      DISCHARGE MEDICATION: Allergies as of 01/28/2024       Reactions   Bee Venom Shortness Of Breath, Rash   Claritin  [loratadine ] Itching, Palpitations   Wasp Venom Anaphylaxis   Apricot Flavoring Agent (non-screening) Swelling, Other (See Comments)   Eyes swell shut   Atorvastatin Itching, Swelling   Eye swelling and headaches   Losartan Itching, Rash        Medication List     TAKE these medications    rOPINIRole  4 MG tablet Commonly known as: REQUIP  Take 4 mg by mouth at bedtime. Take 4 mg by mouth at 8 PM The timing of this medication is very important.   acetaminophen  500 MG tablet Commonly known as: TYLENOL  Take 500-1,000 mg by mouth every 8 (eight) hours as needed (for pain or headaches).   amLODipine  10 MG tablet Commonly known as: NORVASC  Take 10 mg by mouth daily.   amoxicillin -clavulanate 875-125 MG tablet Commonly known as: AUGMENTIN  Take 1 tablet by mouth 2 (two) times daily for 4 days.   apixaban  5 MG Tabs tablet Commonly known as: Eliquis  Take 1 tablet (5 mg total) by mouth 2 (two) times daily.   Centrum Silver 50+Women Tabs Take 1 tablet by mouth daily with breakfast.    empagliflozin  10 MG Tabs tablet Commonly known as: JARDIANCE  Take 10 mg by mouth in the morning.   glimepiride  2 MG tablet Commonly known as: AMARYL  Take 2 mg by mouth daily with breakfast.   guaiFENesin -dextromethorphan  100-10 MG/5ML syrup Commonly known as: ROBITUSSIN DM Take 10 mLs by mouth every 6 (six) hours as needed for cough.   ketoconazole 2 % cream Commonly known as: NIZORAL Apply 1 Application topically daily as needed for irritation.   meclizine  25 MG tablet Commonly known as: ANTIVERT  Take 25 mg by mouth 2 (two) times daily as needed for dizziness.   memantine  5 MG tablet Commonly known as: NAMENDA  Take 5 mg by mouth 2 (two) times daily.   metFORMIN  500 MG tablet Commonly known as: GLUCOPHAGE  Take 2 tablets (1,000 mg total) by mouth daily with breakfast.   olmesartan  40 MG tablet Commonly known as: BENICAR  Take 40 mg by mouth daily.   ondansetron  4 MG disintegrating tablet Commonly known as: ZOFRAN -ODT Take 1 tablet (4 mg total) by mouth every 4 (four) hours as needed for nausea or vomiting.   oseltamivir  75 MG capsule Commonly  known as: TAMIFLU  Take 1 capsule (75 mg total) by mouth 2 (two) times daily for 8 doses.   rosuvastatin  40 MG tablet Commonly known as: CRESTOR  Take 1 tablet (40 mg total) by mouth daily.       Allergies[1] Discharge Instructions     Call MD for:  difficulty breathing, headache or visual disturbances   Complete by: As directed    Call MD for:  extreme fatigue   Complete by: As directed    Call MD for:  persistant dizziness or light-headedness   Complete by: As directed    Call MD for:  persistant nausea and vomiting   Complete by: As directed    Call MD for:  severe uncontrolled pain   Complete by: As directed    Call MD for:  temperature >100.4   Complete by: As directed    Discharge instructions   Complete by: As directed    Follow-up with PCP in 1 week   Increase activity slowly   Complete by: As directed     Increase activity slowly   Complete by: As directed        The results of significant diagnostics from this hospitalization (including imaging, microbiology, ancillary and laboratory) are listed below for reference.    Significant Diagnostic Studies: DG Abd 1 View Result Date: 01/26/2024 EXAM: 1 VIEW XRAY OF THE ABDOMEN 01/26/2024 07:49:00 AM COMPARISON: CT abdomen and pelvis 08/01/2023 and earlier. CLINICAL HISTORY: 78 year old female. Nausea and vomiting. FINDINGS: BOWEL: Moderate gaseous distension of the stomach. No dilated small bowel. Large bowel loops nondilated, containing gas to the rectum. SOFT TISSUES: No abnormal calcifications. BONES: Degenerative changes of the lumbar spine. No acute fracture. IMPRESSION: 1. Moderate gastric distention with gas; consider NG tube decompression. 2. Otherwise Nonobstructive bowel gas pattern. Electronically signed by: Helayne Hurst MD 01/26/2024 07:58 AM EST RP Workstation: [id number]   DG Chest 2 View Result Date: 01/25/2024 CLINICAL DATA:  Shortness of breath.  Cough. EXAM: CHEST - 2 VIEW COMPARISON:  12/09/2023 FINDINGS: Left-sided pacemaker in place. Stable heart size and mediastinal contours. TAVR. Aortic atherosclerosis. Interstitial coarsening, more so in the lung bases. No pneumothorax or pleural effusion. No acute osseous findings. IMPRESSION: Interstitial coarsening, more so in the lung bases, may represent atypical infection. Electronically Signed   By: Andrea Gasman M.D.   On: 01/25/2024 20:02    Microbiology: Recent Results (from the past 240 hours)  Resp panel by RT-PCR (RSV, Flu A&B, Covid) Anterior Nasal Swab     Status: Abnormal   Collection Time: 01/25/24  7:33 PM   Specimen: Anterior Nasal Swab  Result Value Ref Range Status   SARS Coronavirus 2 by RT PCR NEGATIVE NEGATIVE Final   Influenza A by PCR POSITIVE (A) NEGATIVE Final   Influenza B by PCR NEGATIVE NEGATIVE Final    Comment: (NOTE) The Xpert Xpress SARS-CoV-2/FLU/RSV  plus assay is intended as an aid in the diagnosis of influenza from Nasopharyngeal swab specimens and should not be used as a sole basis for treatment. Nasal washings and aspirates are unacceptable for Xpert Xpress SARS-CoV-2/FLU/RSV testing.  Fact Sheet for Patients: bloggercourse.com  Fact Sheet for Healthcare Providers: seriousbroker.it  This test is not yet approved or cleared by the [place]  FDA and has been authorized for detection and/or diagnosis of SARS-CoV-2 by FDA under an Emergency Use Authorization (EUA). This EUA will remain in effect (meaning this test can be used) for the duration of the COVID-19 declaration under Section 564(b)(1) of the Act,  21 U.S.C. section 360bbb-3(b)(1), unless the authorization is terminated or revoked.     Resp Syncytial Virus by PCR NEGATIVE NEGATIVE Final    Comment: (NOTE) Fact Sheet for Patients: bloggercourse.com  Fact Sheet for Healthcare Providers: seriousbroker.it  This test is not yet approved or cleared by the [place]  FDA and has been authorized for detection and/or diagnosis of SARS-CoV-2 by FDA under an Emergency Use Authorization (EUA). This EUA will remain in effect (meaning this test can be used) for the duration of the COVID-19 declaration under Section 564(b)(1) of the Act, 21 U.S.C. section 360bbb-3(b)(1), unless the authorization is terminated or revoked.  Performed at [facility], [address], [place], [place] [address]      Labs: CBC: Recent Labs  Lab 01/25/24 2014 01/26/24 0454  WBC 6.3 7.0  HGB 13.9 12.9  HCT 41.3 38.0  MCV 92.4 91.3  PLT 132* 120*   Basic Metabolic Panel: Recent Labs  Lab 01/25/24 2014 01/26/24 0454  NA 135 137  K 3.7 3.5  CL 99 101  CO2 23 23  GLUCOSE 116* 152*  BUN 12 14  CREATININE 0.82 0.80  CALCIUM  9.6 9.1   Liver Function Tests: Recent Labs   Lab 01/26/24 0454  AST 24  ALT 19  ALKPHOS 65  BILITOT 1.0  PROT 6.4*  ALBUMIN 3.9   No results for input(s): LIPASE, AMYLASE in the last 168 hours. No results for input(s): AMMONIA in the last 168 hours. Cardiac Enzymes: No results for input(s): CKTOTAL, CKMB, CKMBINDEX, TROPONINI in the last 168 hours. BNP (last 3 results) No results for input(s): BNP in the last 8760 hours. CBG: Recent Labs  Lab 01/27/24 1212 01/27/24 1608 01/27/24 2119 01/28/24 0809 01/28/24 1214  GLUCAP 115* 148* 179* 97 183*    Time spent: 35 minutes  Signed:  Elvan Sor  [facility] 01/28/2024 3:33 PM      [1]  Allergies Allergen Reactions   Bee Venom Shortness Of Breath and Rash   Claritin  [Loratadine ] Itching and Palpitations   Wasp Venom Anaphylaxis   Apricot Flavoring Agent (Non-Screening) Swelling and Other (See Comments)    Eyes swell shut   Atorvastatin Itching and Swelling    Eye swelling and headaches   Losartan Itching and Rash

## 2024-01-29 ENCOUNTER — Other Ambulatory Visit (HOSPITAL_COMMUNITY): Payer: Self-pay

## 2024-02-26 ENCOUNTER — Ambulatory Visit: Payer: Medicare Other

## 2024-02-28 ENCOUNTER — Telehealth: Payer: Self-pay | Admitting: Cardiology

## 2024-02-28 NOTE — Telephone Encounter (Signed)
 Patient was called regarding missed transmission

## 2024-03-04 ENCOUNTER — Emergency Department (HOSPITAL_COMMUNITY)
Admission: EM | Admit: 2024-03-04 | Discharge: 2024-03-04 | Disposition: A | Discharge status: Home / Self Care | Attending: Emergency Medicine | Admitting: Emergency Medicine

## 2024-03-04 ENCOUNTER — Emergency Department (HOSPITAL_COMMUNITY)

## 2024-03-04 ENCOUNTER — Encounter (HOSPITAL_COMMUNITY): Payer: Self-pay

## 2024-03-04 ENCOUNTER — Other Ambulatory Visit: Payer: Self-pay

## 2024-03-04 DIAGNOSIS — Z950 Presence of cardiac pacemaker: Secondary | ICD-10-CM | POA: Insufficient documentation

## 2024-03-04 DIAGNOSIS — Z952 Presence of prosthetic heart valve: Secondary | ICD-10-CM | POA: Insufficient documentation

## 2024-03-04 DIAGNOSIS — M25511 Pain in right shoulder: Secondary | ICD-10-CM

## 2024-03-04 DIAGNOSIS — Z7901 Long term (current) use of anticoagulants: Secondary | ICD-10-CM | POA: Insufficient documentation

## 2024-03-04 DIAGNOSIS — M19012 Primary osteoarthritis, left shoulder: Secondary | ICD-10-CM

## 2024-03-04 DIAGNOSIS — M1990 Unspecified osteoarthritis, unspecified site: Secondary | ICD-10-CM | POA: Insufficient documentation

## 2024-03-04 DIAGNOSIS — I4891 Unspecified atrial fibrillation: Secondary | ICD-10-CM | POA: Insufficient documentation

## 2024-03-04 DIAGNOSIS — I509 Heart failure, unspecified: Secondary | ICD-10-CM | POA: Insufficient documentation

## 2024-03-04 LAB — CBC WITH DIFFERENTIAL/PLATELET
Abs Immature Granulocytes: 0.03 10*3/uL (ref 0.00–0.07)
Basophils Absolute: 0.1 10*3/uL (ref 0.0–0.1)
Basophils Relative: 1 %
Eosinophils Absolute: 0.1 10*3/uL (ref 0.0–0.5)
Eosinophils Relative: 1 %
HCT: 41.2 % (ref 36.0–46.0)
Hemoglobin: 14.3 g/dL (ref 12.0–15.0)
Immature Granulocytes: 0 %
Lymphocytes Relative: 24 %
Lymphs Abs: 2.1 10*3/uL (ref 0.7–4.0)
MCH: 31.5 pg (ref 26.0–34.0)
MCHC: 34.7 g/dL (ref 30.0–36.0)
MCV: 90.7 fL (ref 80.0–100.0)
Monocytes Absolute: 0.9 10*3/uL (ref 0.1–1.0)
Monocytes Relative: 11 %
Neutro Abs: 5.5 10*3/uL (ref 1.7–7.7)
Neutrophils Relative %: 63 %
Platelets: 164 10*3/uL (ref 150–400)
RBC: 4.54 MIL/uL (ref 3.87–5.11)
RDW: 13.6 % (ref 11.5–15.5)
WBC: 8.6 10*3/uL (ref 4.0–10.5)
nRBC: 0 % (ref 0.0–0.2)

## 2024-03-04 LAB — BASIC METABOLIC PANEL WITH GFR
Anion gap: 13 (ref 5–15)
BUN: 12 mg/dL (ref 8–23)
CO2: 24 mmol/L (ref 22–32)
Calcium: 10 mg/dL (ref 8.9–10.3)
Chloride: 103 mmol/L (ref 98–111)
Creatinine, Ser: 0.71 mg/dL (ref 0.44–1.00)
GFR, Estimated: >60 mL/min
Glucose, Bld: 108 mg/dL — ABNORMAL HIGH (ref 70–99)
Potassium: 4.2 mmol/L (ref 3.5–5.1)
Sodium: 140 mmol/L (ref 135–145)

## 2024-03-04 LAB — CBG MONITORING, ED: Glucose-Capillary: 105 mg/dL — ABNORMAL HIGH (ref 70–99)

## 2024-03-04 LAB — TROPONIN T, HIGH SENSITIVITY
Troponin T High Sensitivity: 17 ng/L (ref 0–19)
Troponin T High Sensitivity: 17 ng/L (ref 0–19)

## 2024-03-04 MED ORDER — ACETAMINOPHEN 500 MG PO TABS
1000.0000 mg | ORAL_TABLET | Freq: Once | ORAL | Status: AC
  Administered 2024-03-04: 1000 mg via ORAL
  Filled 2024-03-04: qty 2

## 2024-03-04 NOTE — ED Triage Notes (Addendum)
 Pt bib gems from home. left shoulder blade pain started at 2300 last night. Denies any falls. Pt did not take any of her daily meds today. Hx of pacemaker.

## 2024-03-04 NOTE — Discharge Instructions (Addendum)
 Use tylenol  every 4 hrs as needed for pain. Your blood work was reassuring no signs of heart attack. Return for difficulty breathing, passing out, chest pain or new concerns.

## 2024-05-27 ENCOUNTER — Ambulatory Visit: Payer: Medicare Other

## 2024-08-26 ENCOUNTER — Ambulatory Visit: Payer: Medicare Other

## 2024-11-25 ENCOUNTER — Ambulatory Visit: Payer: Medicare Other

## 2025-02-24 ENCOUNTER — Ambulatory Visit: Payer: Medicare Other
